# Patient Record
Sex: Male | Born: 1945
Health system: Southern US, Community
[De-identification: ages and names within clinical notes are randomized; demographics above are authoritative.]

## PROBLEM LIST (undated history)

## (undated) DIAGNOSIS — M069 Rheumatoid arthritis, unspecified: Secondary | ICD-10-CM

## (undated) DIAGNOSIS — R209 Unspecified disturbances of skin sensation: Secondary | ICD-10-CM

## (undated) DIAGNOSIS — I82419 Acute embolism and thrombosis of unspecified femoral vein: Secondary | ICD-10-CM

## (undated) DIAGNOSIS — I2510 Atherosclerotic heart disease of native coronary artery without angina pectoris: Secondary | ICD-10-CM

## (undated) DIAGNOSIS — E079 Disorder of thyroid, unspecified: Secondary | ICD-10-CM

## (undated) DIAGNOSIS — I252 Old myocardial infarction: Secondary | ICD-10-CM

## (undated) DIAGNOSIS — M5020 Other cervical disc displacement, unspecified cervical region: Secondary | ICD-10-CM

## (undated) DIAGNOSIS — Z8719 Personal history of other diseases of the digestive system: Secondary | ICD-10-CM

## (undated) DIAGNOSIS — M1990 Unspecified osteoarthritis, unspecified site: Secondary | ICD-10-CM

## (undated) DIAGNOSIS — E119 Type 2 diabetes mellitus without complications: Secondary | ICD-10-CM

## (undated) DIAGNOSIS — E785 Hyperlipidemia, unspecified: Secondary | ICD-10-CM

## (undated) DIAGNOSIS — I10 Essential (primary) hypertension: Secondary | ICD-10-CM

## (undated) DIAGNOSIS — I209 Angina pectoris, unspecified: Secondary | ICD-10-CM

## (undated) DIAGNOSIS — G43909 Migraine, unspecified, not intractable, without status migrainosus: Secondary | ICD-10-CM

## (undated) DIAGNOSIS — J189 Pneumonia, unspecified organism: Secondary | ICD-10-CM

## (undated) DIAGNOSIS — R51 Headache: Secondary | ICD-10-CM

## (undated) DIAGNOSIS — I201 Angina pectoris with documented spasm: Secondary | ICD-10-CM

## (undated) DIAGNOSIS — Z9889 Other specified postprocedural states: Secondary | ICD-10-CM

## (undated) HISTORY — DX: Other specified postprocedural states: Z98.890

## (undated) HISTORY — DX: Other cervical disc displacement, unspecified cervical region: M50.20

## (undated) HISTORY — DX: Personal history of other diseases of the digestive system: Z87.19

## (undated) HISTORY — DX: Rheumatoid arthritis, unspecified: M06.9

## (undated) HISTORY — PX: CARDIAC CATHETERIZATION: SHX172

## (undated) HISTORY — DX: Angina pectoris with documented spasm: I20.1

## (undated) HISTORY — DX: Disorder of thyroid, unspecified: E07.9

---

## 1981-06-10 HISTORY — PX: PENILE PROSTHESIS IMPLANT: SHX240

## 1999-08-06 ENCOUNTER — Encounter: Admission: RE | Admit: 1999-08-06 | Discharge: 1999-11-04 | Payer: Self-pay | Admitting: Endocrinology

## 2003-08-09 ENCOUNTER — Emergency Department (HOSPITAL_COMMUNITY): Admission: EM | Admit: 2003-08-09 | Discharge: 2003-08-09 | Payer: Self-pay | Admitting: Emergency Medicine

## 2003-08-09 IMAGING — CR DG CHEST 2V
2 series · 2 of 2 positions shown · non-contrast
Comparison: none

CLINICAL DATA: Chest pain, shortness of breath, fever, diabetic Type II.  Nausea/vomiting, diarrhea for one day.
 TWO VIEW CHEST
 PA and lateral views of the chest made [DATE] at [JE] hours show generalized peribronchial thickening and some hyperaeration at the bases.  The cardiopericardial silhouette is minimally prominent.  The aorta is slightly elongated but not calcified or dilated.  Peripheral lungs are clear.  The hilar and basilar areas show some generalized peribronchial thickening.  There is no pleural effusion or pneumothorax.  The bones show some hypertrophic spurring but no fracture or metastatic disease.
 IMPRESSION 
 Mild cardiomegaly.  Generalized peribronchial thickening.  Some bibasilar atelectasis.  No definite consolidation or edema.

[view not recorded (1 of 2)]
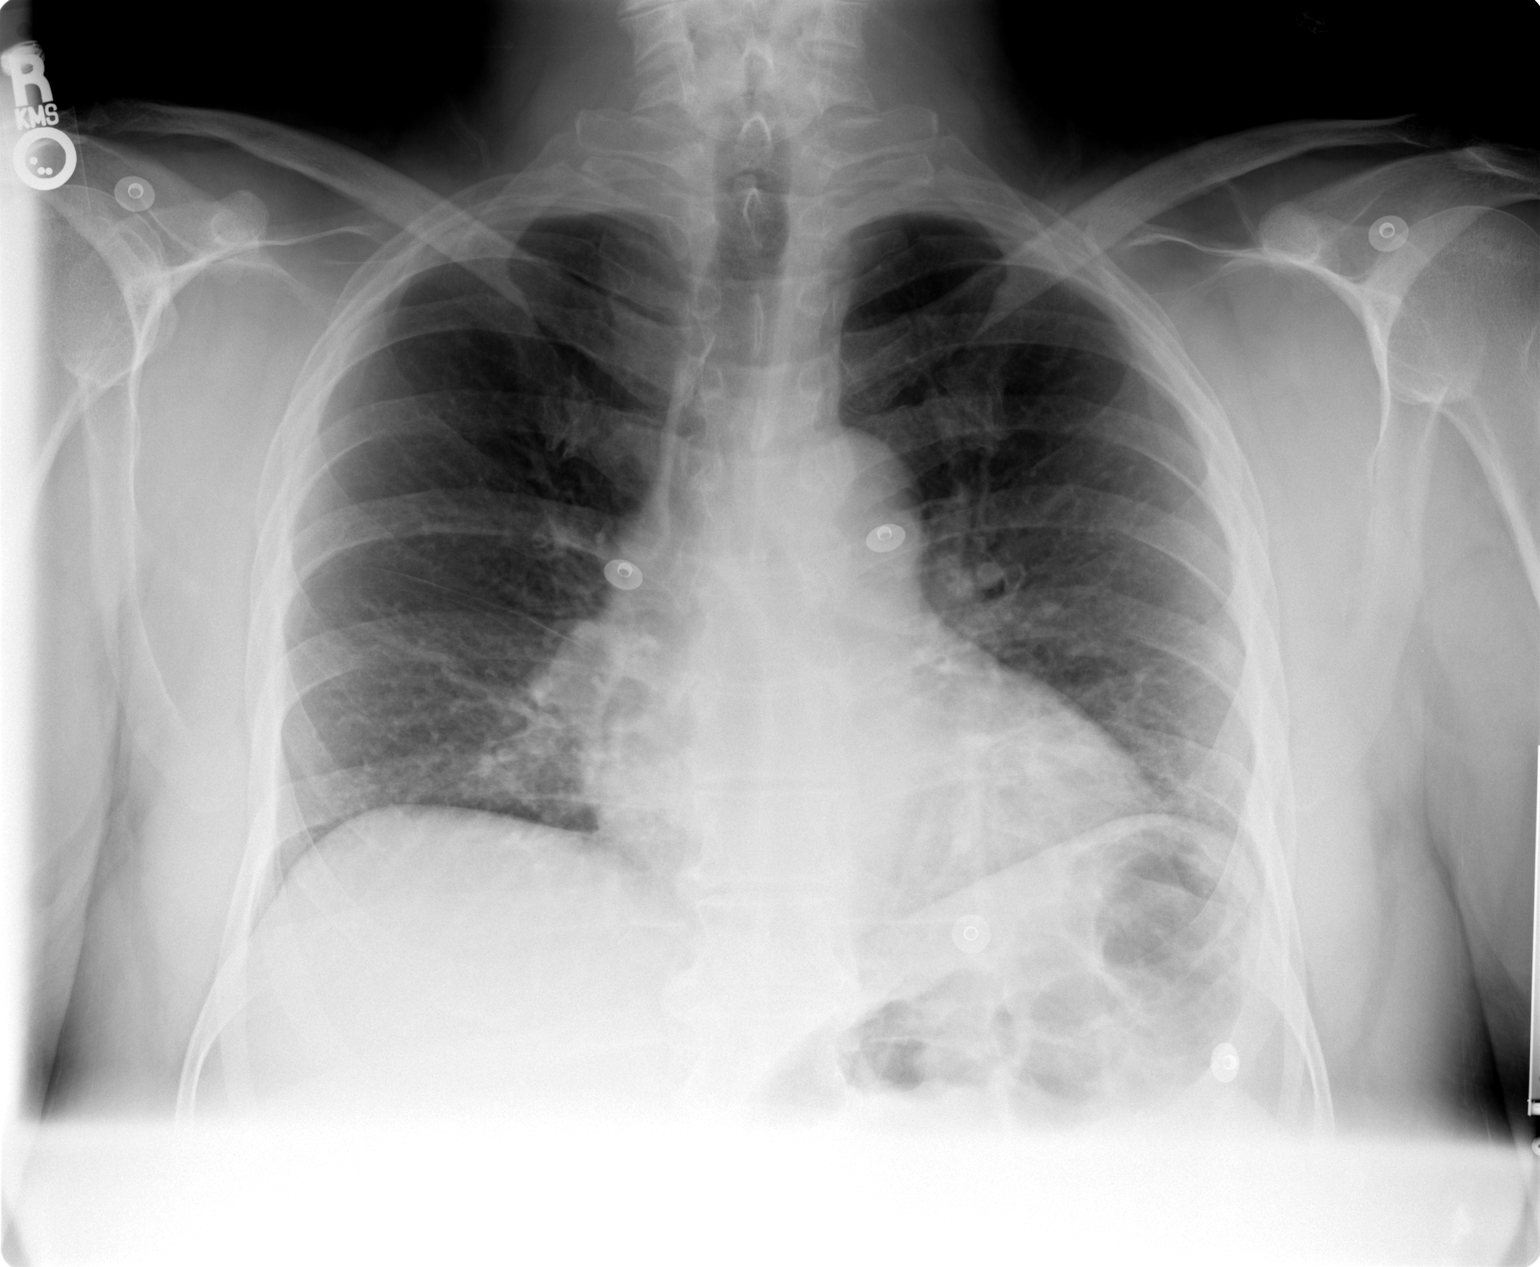

[view not recorded (2 of 2)]
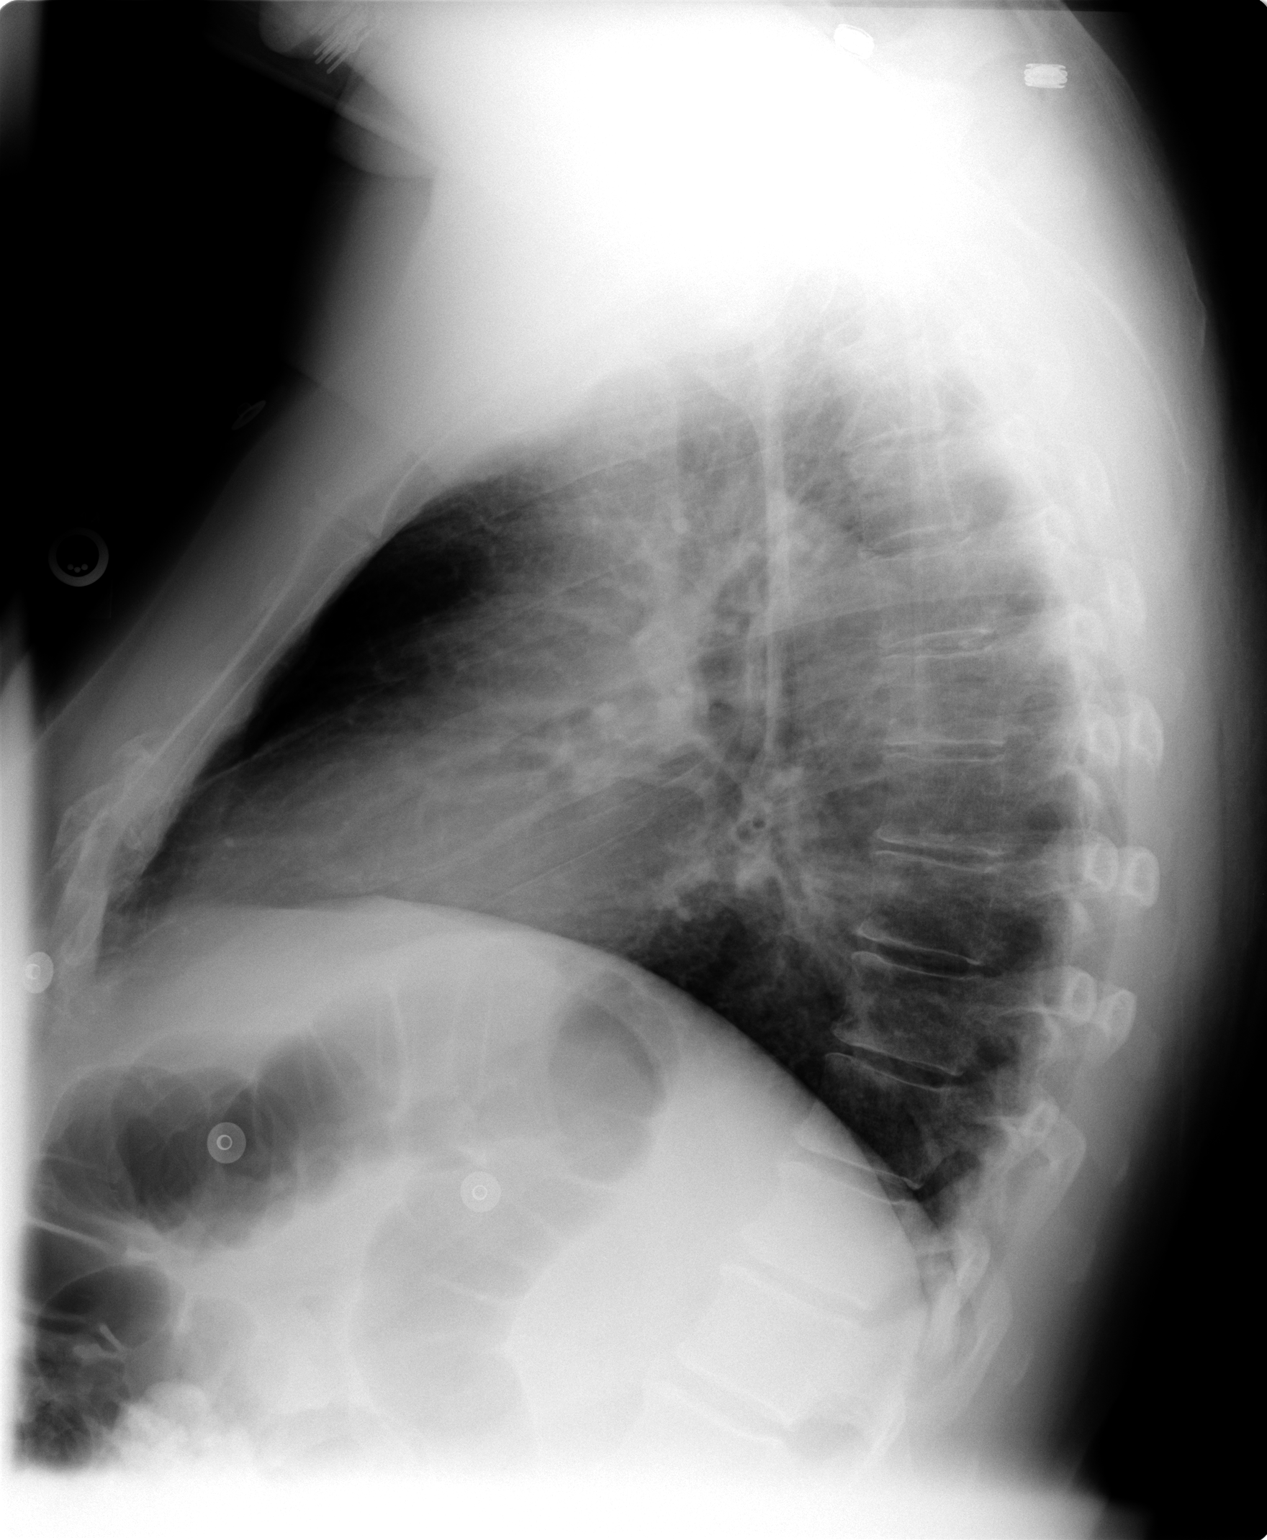

[2 of 2 positions shown; findings below may reference images not displayed]

## 2003-09-14 ENCOUNTER — Ambulatory Visit (HOSPITAL_COMMUNITY): Admission: RE | Admit: 2003-09-14 | Discharge: 2003-09-14 | Payer: Self-pay | Admitting: *Deleted

## 2003-09-14 ENCOUNTER — Encounter (INDEPENDENT_AMBULATORY_CARE_PROVIDER_SITE_OTHER): Payer: Self-pay | Admitting: Specialist

## 2004-09-26 ENCOUNTER — Encounter: Admission: RE | Admit: 2004-09-26 | Discharge: 2004-09-26 | Payer: Self-pay | Admitting: Neurology

## 2005-02-20 ENCOUNTER — Emergency Department (HOSPITAL_COMMUNITY): Admission: EM | Admit: 2005-02-20 | Discharge: 2005-02-20 | Payer: Self-pay | Admitting: Emergency Medicine

## 2005-02-20 IMAGING — DX DG ORTHOPANTOGRAM /PANORAMIC
1 series · 1 of 1 positions shown · non-contrast
Comparison: none

CLINICAL DATA: 59-year-old, fell.  Deep laceration upper lip. 
 ORTHOPANTOGRAM (PANOREX):

[view not recorded]
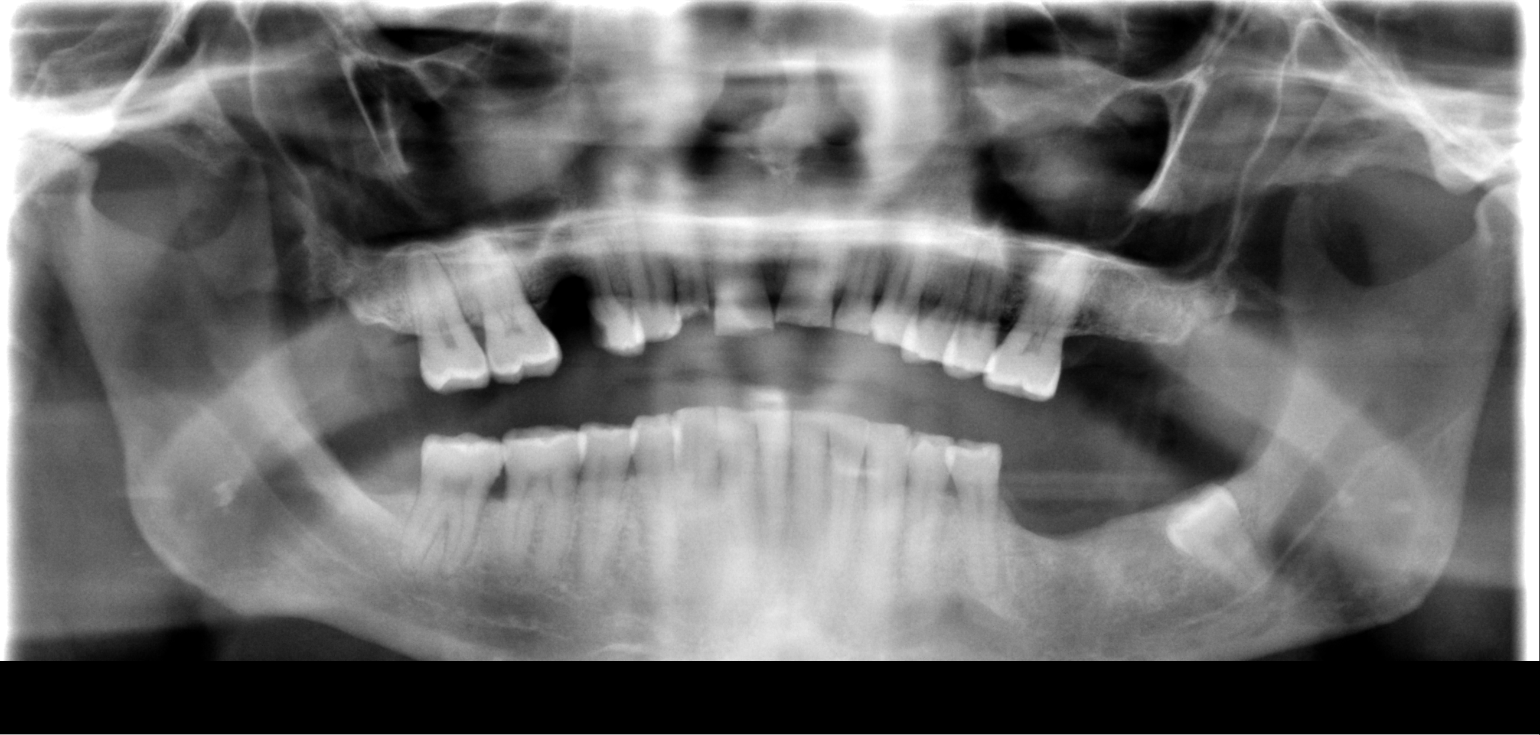

[1 of 1 positions shown; findings below may reference images not displayed]

FINDINGS: The mandibular condyles are normally located.  No mandible fractures are seen.  I do not see any definite fracture of the maxilla.  No evidence for periapical abscess.  There are some dental caries.
IMPRESSION: No mandible or maxilla fractures identified.

## 2005-02-20 IMAGING — CT CT HEAD W/O CM
1 of 2 series · 13 of 30 positions shown, 17 images · non-contrast
Comparison: None.

CLINICAL DATA: Status post fall with possible loss of consciousness.  Laceration.
 HEAD CT WITHOUT CONTRAST ? [DATE]:
TECHNIQUE: Contiguous axial CT images were obtained from the base of the skull through the vertex according to standard protocol without contrast.

[Series 2: brain · axial · 0.49mm/px · z∈[+162,+294]mm · 13 of 32 slices shown, 17 images]
[im 3/32  brain]
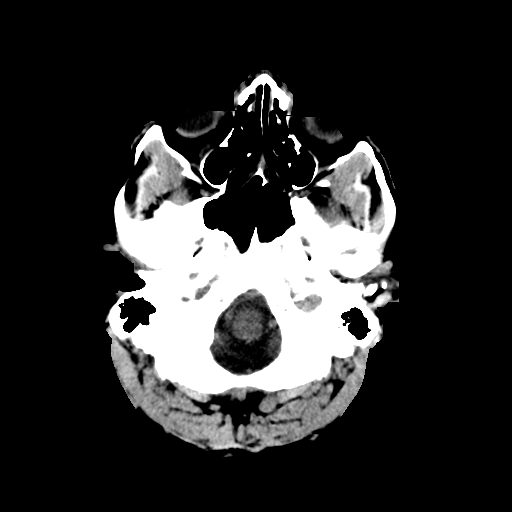
[im 3/32  bone]
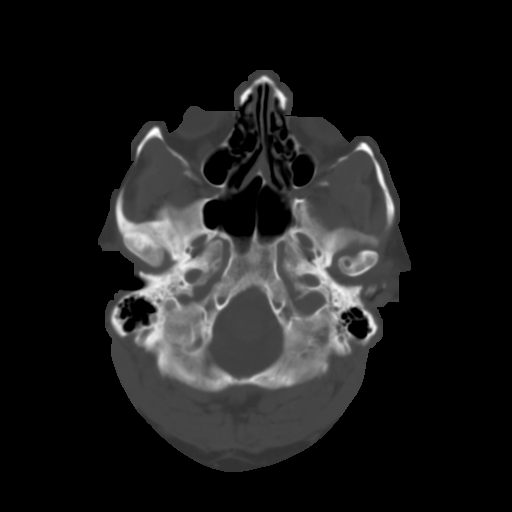
[im 5/32  brain]
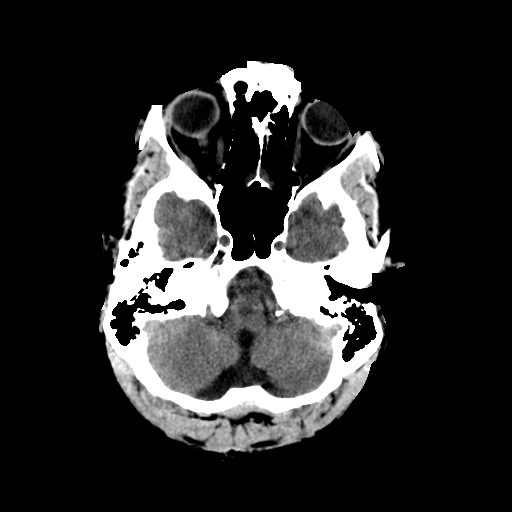
[im 7/32  brain]
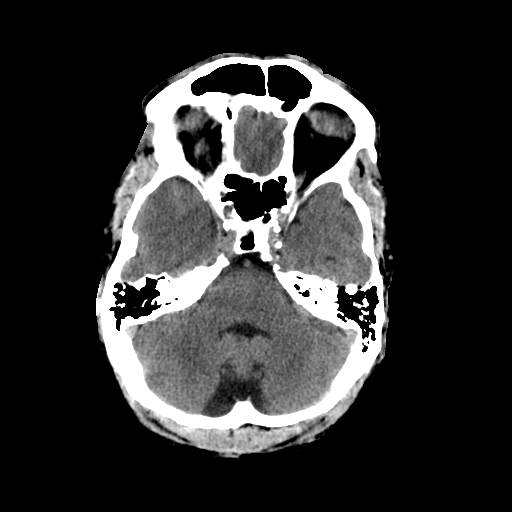
[im 9/32  brain]
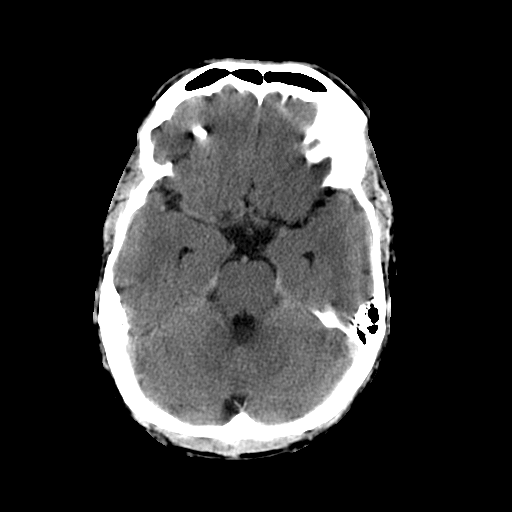
[im 12/32  brain]
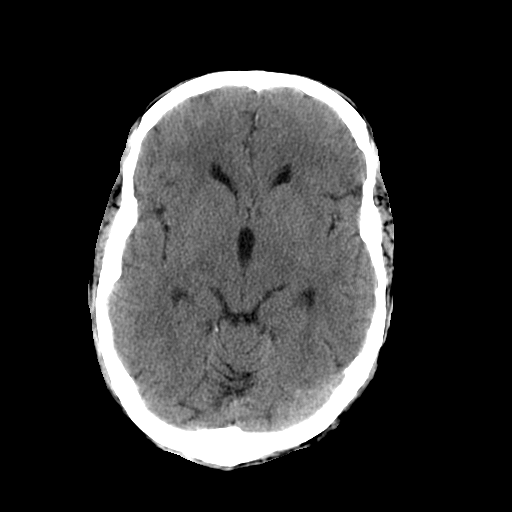
[im 12/32  bone]
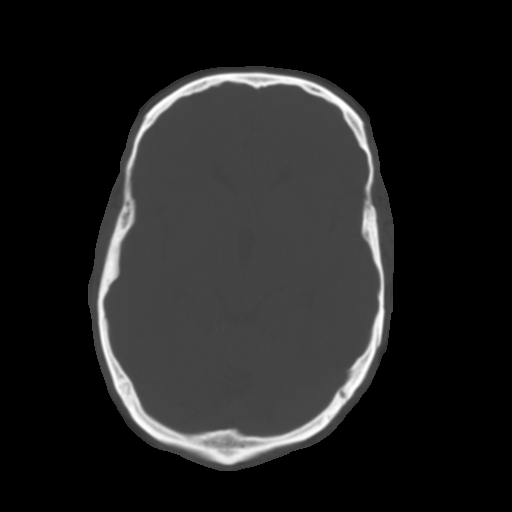
[im 14/32  brain]
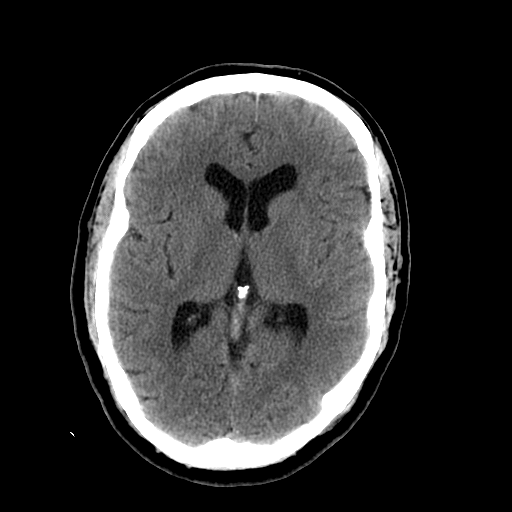
[im 16/32  brain]
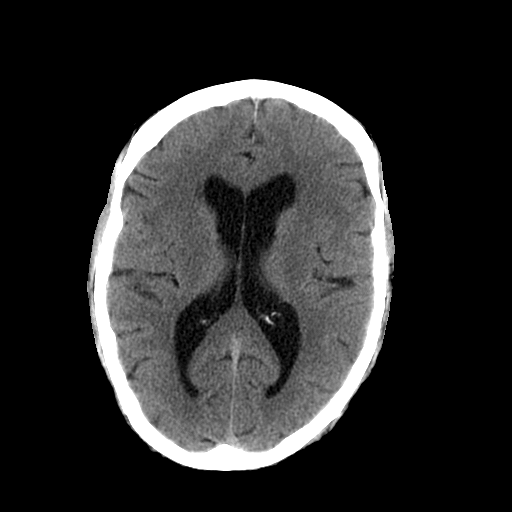
[im 18/32  brain]
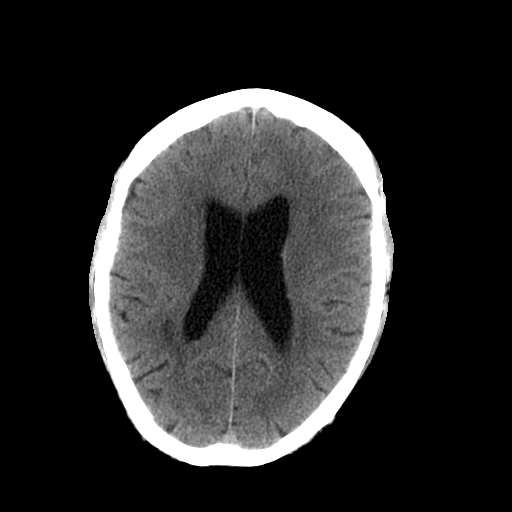
[im 20/32  brain]
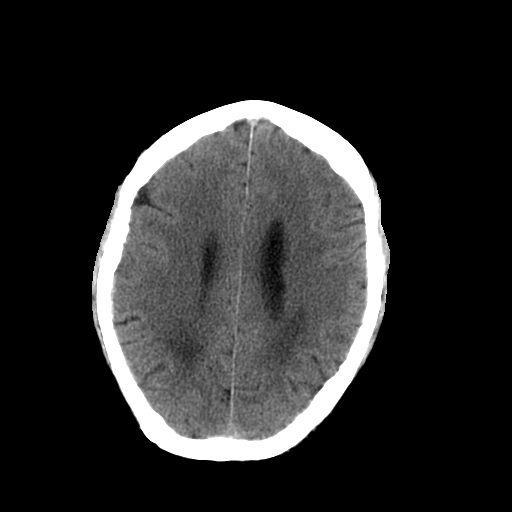
[im 20/32  bone]
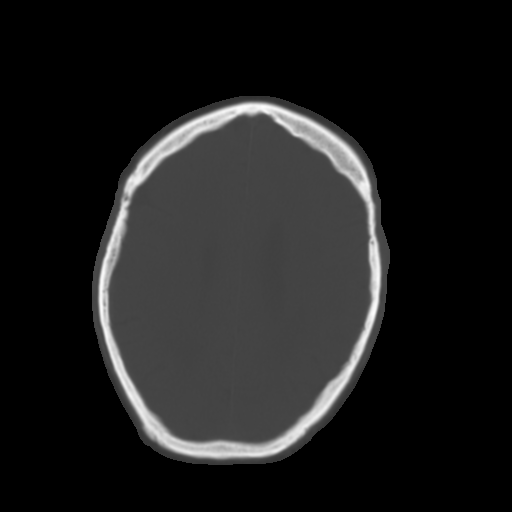
[im 23/32  brain]
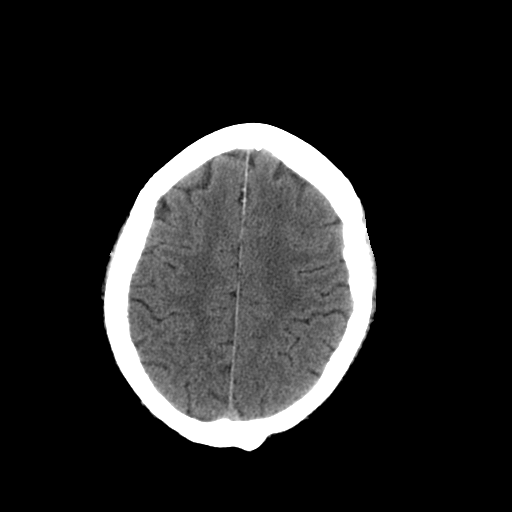
[im 25/32  brain]
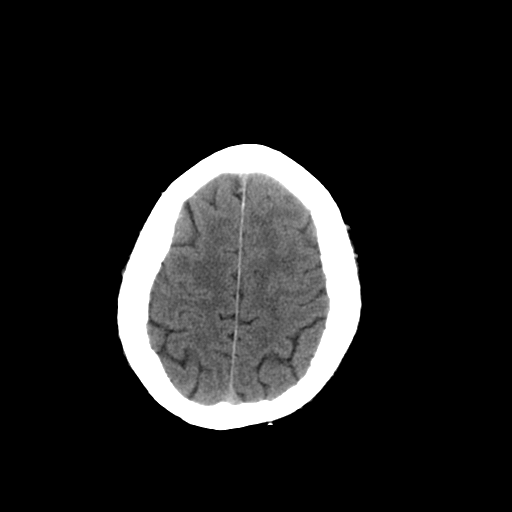
[im 27/32  brain]
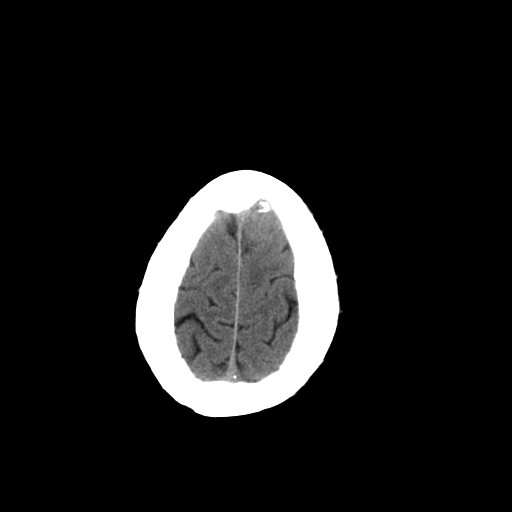
[im 29/32  brain]
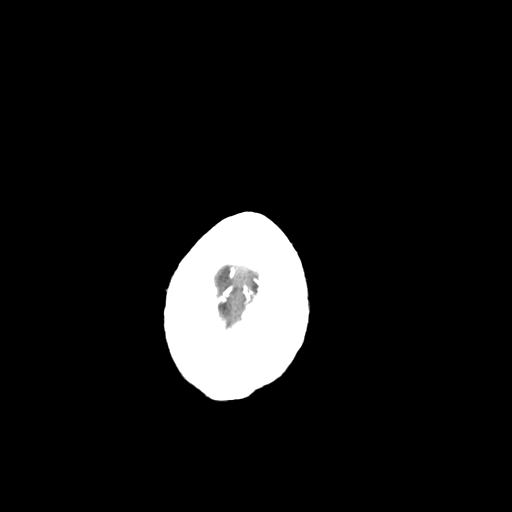
[im 29/32  bone]
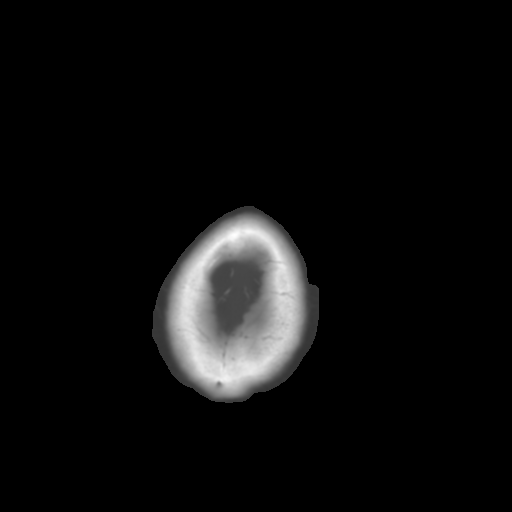

[13 of 30 positions shown; findings below may reference images not displayed]

FINDINGS: No evidence of acute infarct, hemorrhage, mass, mass effect, or hydrocephalus.  There is confluent low attenuation in the periventricular deep white matter.  Apparent low attenuation within the midbrain is felt to be due beam hardening artifact.  There is minimal mucosal thickening in the ethmoid air cells.  The paranasal sinuses are otherwise clear.  No fracture.  The mastoid air cells are clear.
IMPRESSION: 1.  No acute intracranial abnormality.
 2.  Chronic microvascular ischemic disease.

## 2006-06-10 DIAGNOSIS — M5020 Other cervical disc displacement, unspecified cervical region: Secondary | ICD-10-CM

## 2006-06-10 HISTORY — DX: Other cervical disc displacement, unspecified cervical region: M50.20

## 2007-08-06 ENCOUNTER — Observation Stay (HOSPITAL_COMMUNITY): Admission: EM | Admit: 2007-08-06 | Discharge: 2007-08-07 | Payer: Self-pay | Admitting: Emergency Medicine

## 2007-08-06 IMAGING — CR DG CHEST 1V PORT
1 series · 1 of 1 positions shown · non-contrast
Comparison: [DATE].

CLINICAL DATA: Chest pain.
 PORTABLE CHEST - 1 VIEW:

[AP]
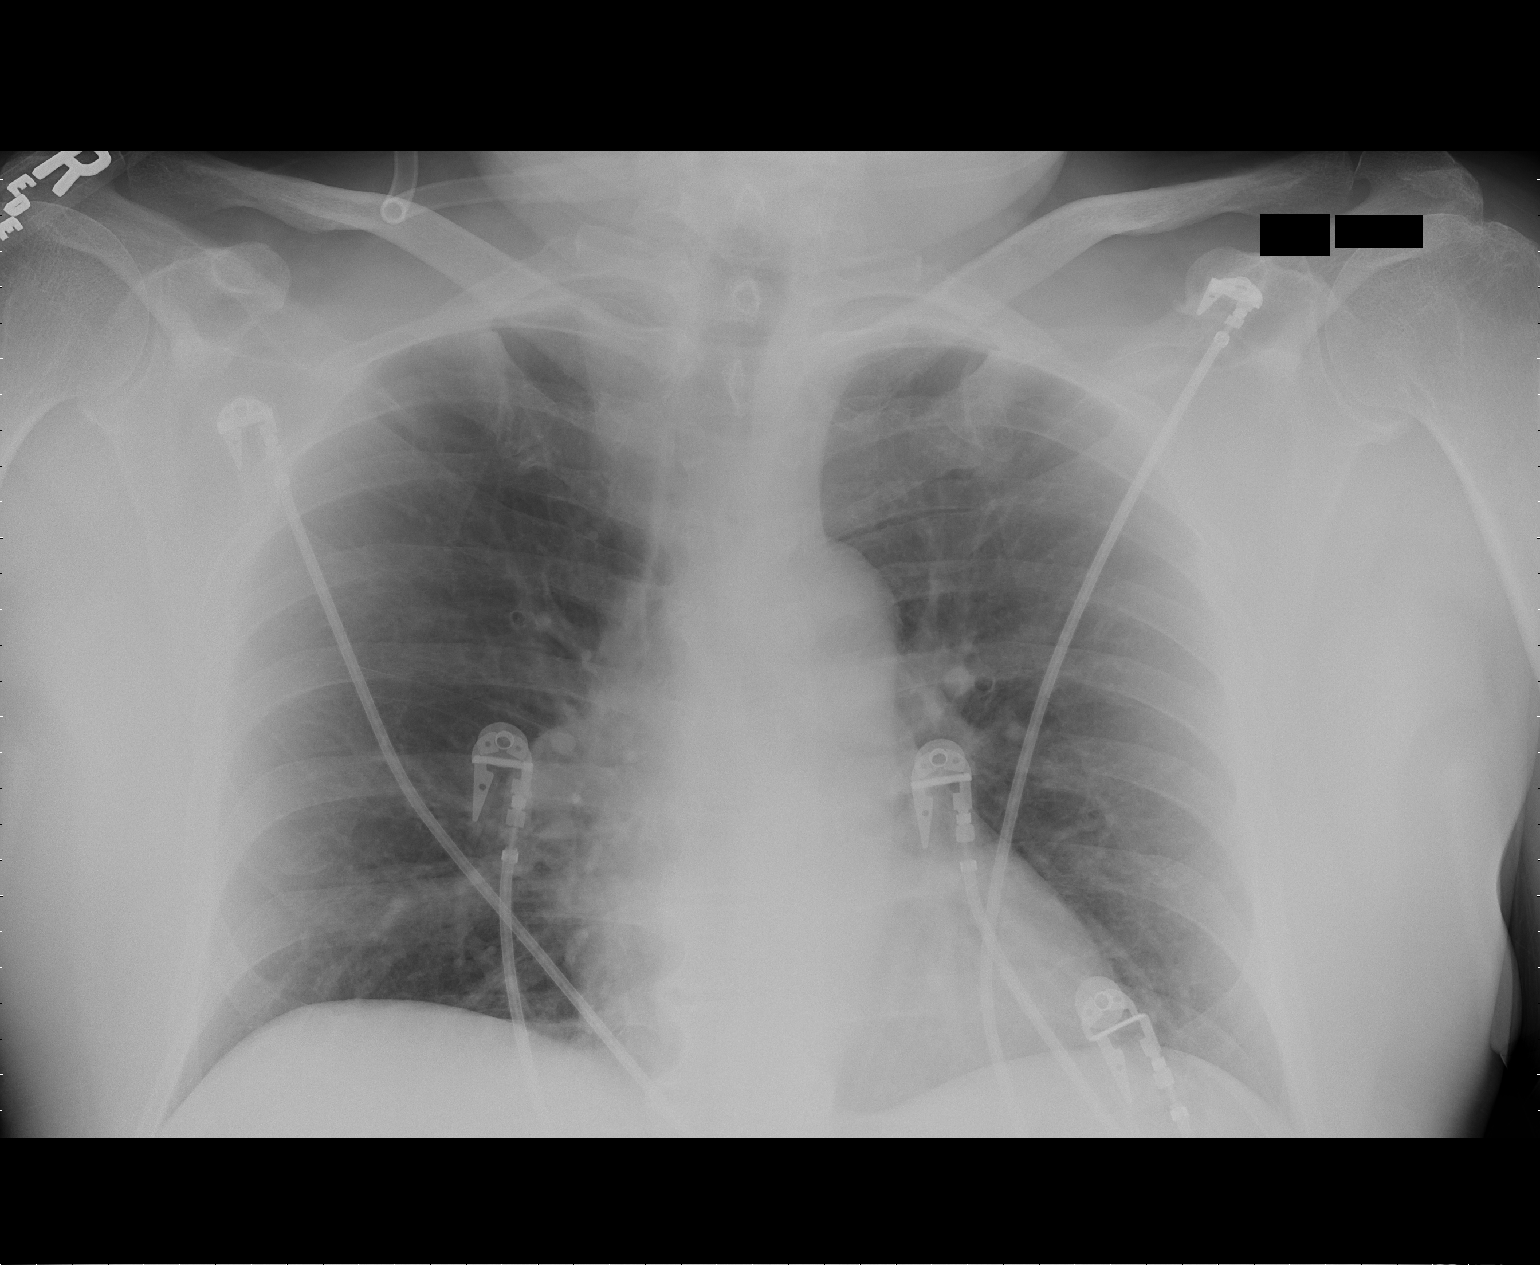

[1 of 1 positions shown; findings below may reference images not displayed]

FINDINGS: Heart and lungs within normal limits for AP projection. Osseous structures intact in 1 view.
IMPRESSION: No active disease.

## 2007-08-12 ENCOUNTER — Emergency Department (HOSPITAL_COMMUNITY): Admission: EM | Admit: 2007-08-12 | Discharge: 2007-08-12 | Payer: Self-pay | Admitting: Emergency Medicine

## 2007-08-12 IMAGING — CT CT CHEST W/ CM
1 of 3 series · 15 of 32 positions shown, 19 images · IV contrast (APPLIED)
Comparison: none

HISTORY: Chest pain, nausea, vomiting

[Series 6: pulm embolism 1.0 thins · axial · 0.62mm/px · z∈[+1282,+1532]mm · 15 of 280 slices shown, 19 images]
[im 15/280  mediastinal]
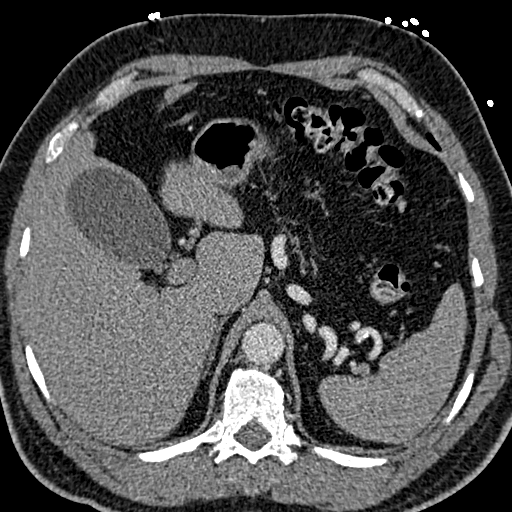
[im 15/280  lung]
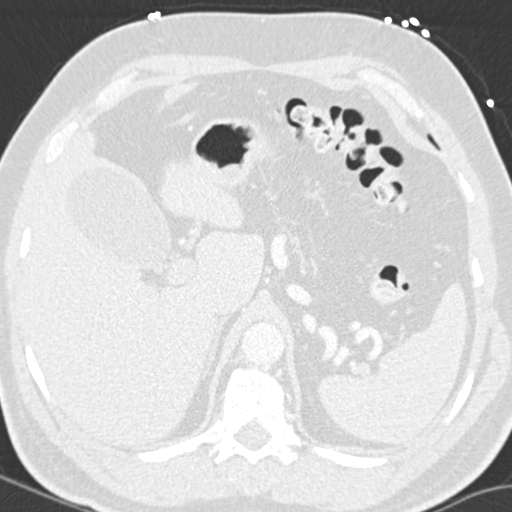
[im 45/280  lung]
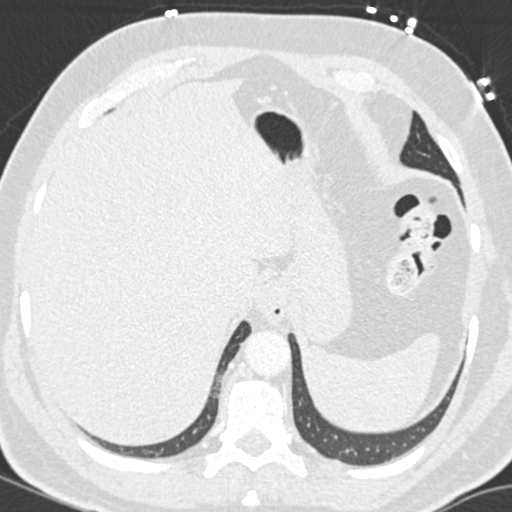
[im 59/280  lung]
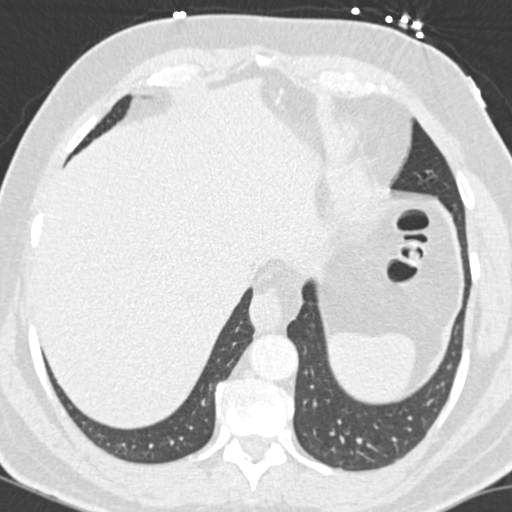
[im 74/280  lung]
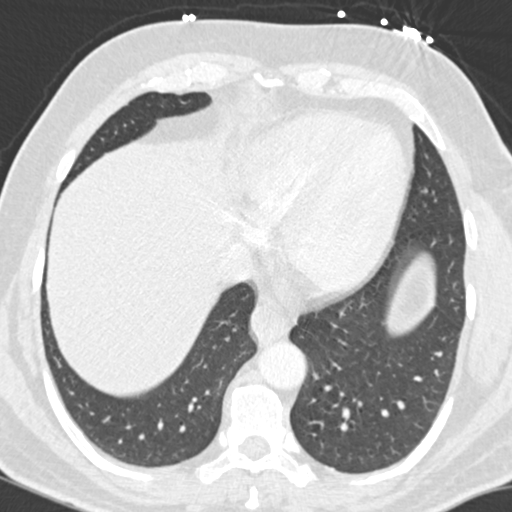
[im 94/280  mediastinal]
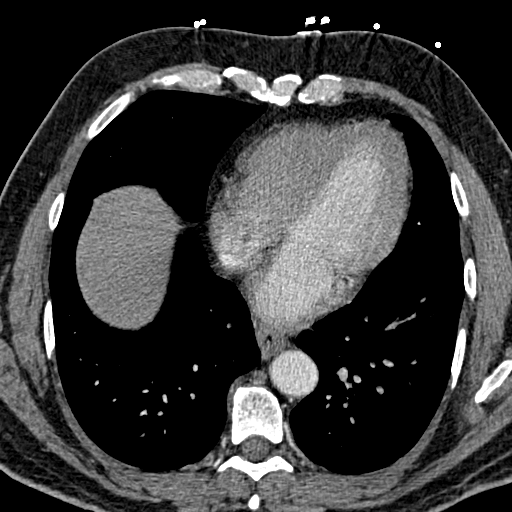
[im 94/280  lung]
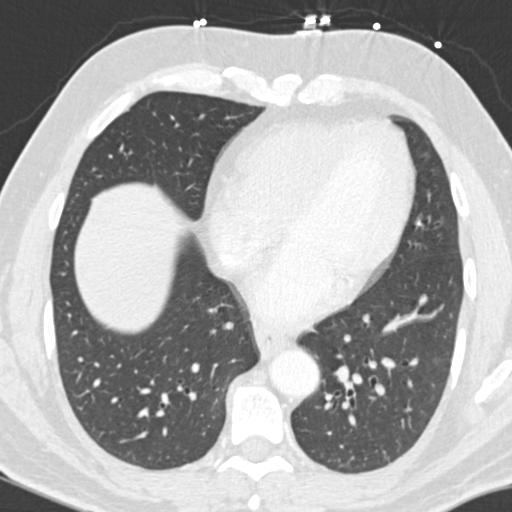
[im 103/280  lung]
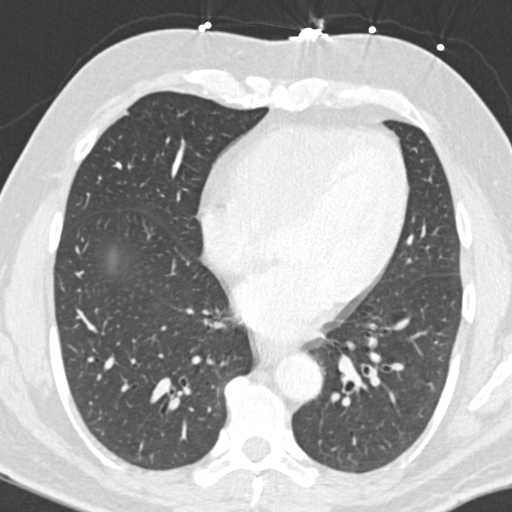
[im 131/280  lung]
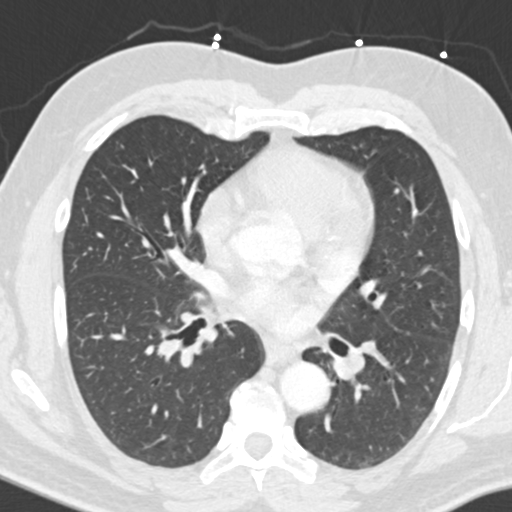
[im 133/280  lung]
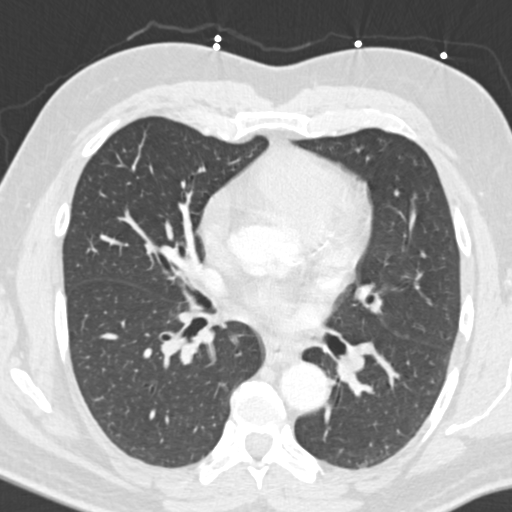
[im 147/280  mediastinal]
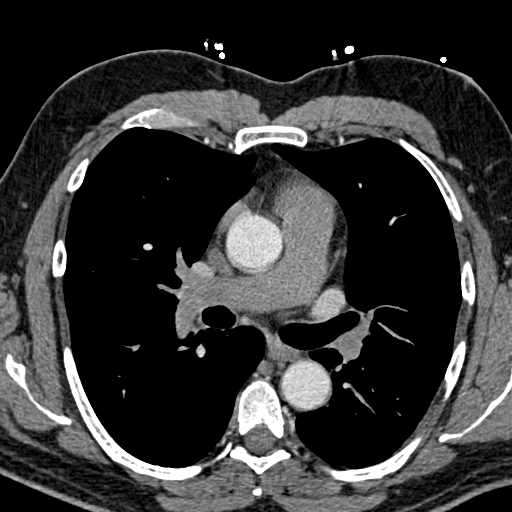
[im 147/280  lung]
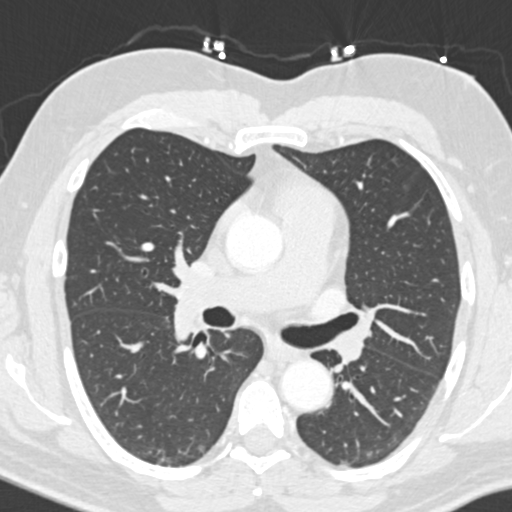
[im 177/280  lung]
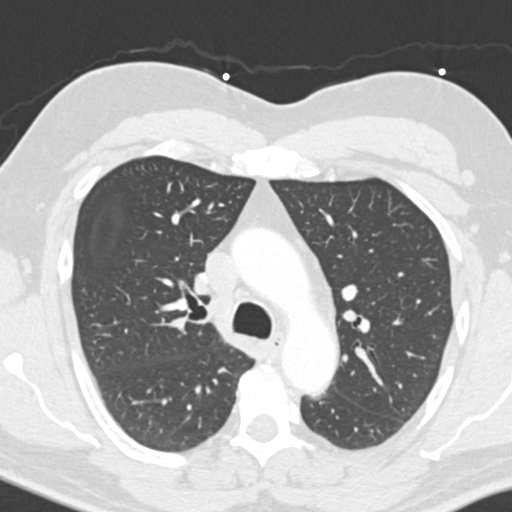
[im 187/280  lung]
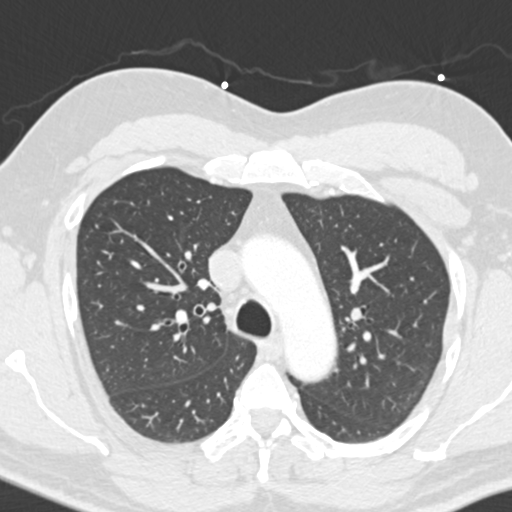
[im 206/280  lung]
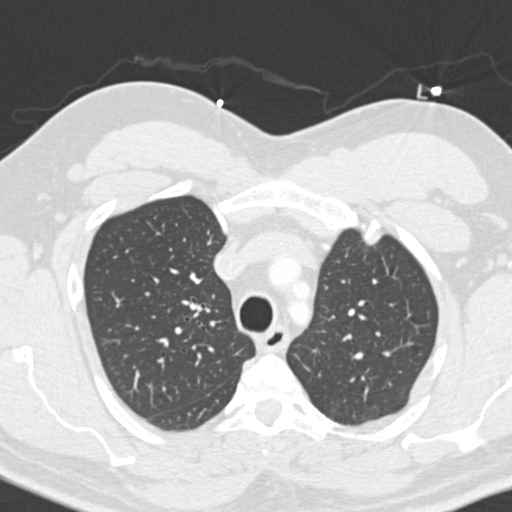
[im 221/280  mediastinal]
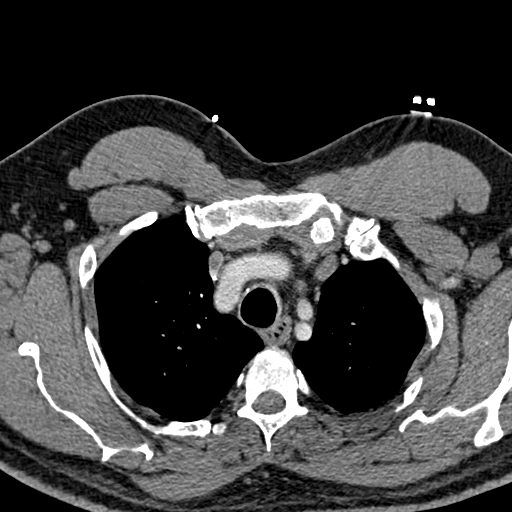
[im 221/280  lung]
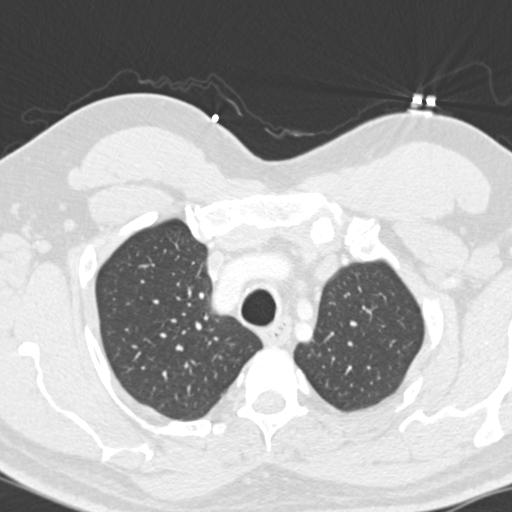
[im 235/280  lung]
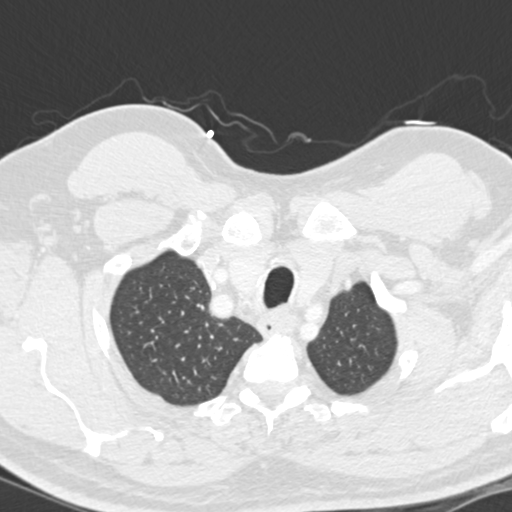
[im 265/280  lung]
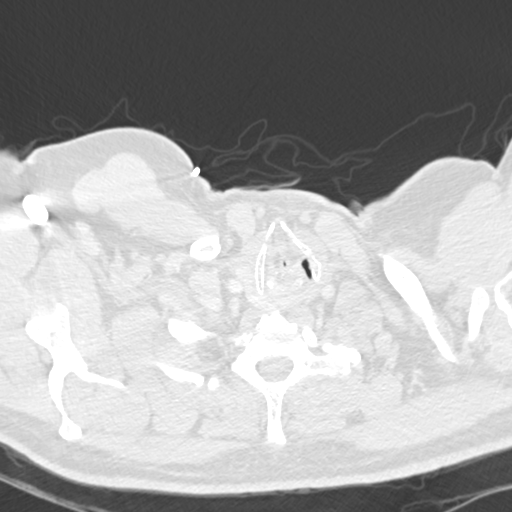

[15 of 32 positions shown; findings below may reference images not displayed]

CT CHEST WITH CONTRAST:

Multidetector helical CT imaging chest performed following 100 cc [7A].
Exam was attempted as a CT angio chest for pulmonary embolism but the contrast
bolus is mis-timed, scanning performed during the systemic arterial phase.
Examination is converted to a CT chest with contrast and interpreted at ER
physician request.
Sagittal and coronal images reconstructed from axial data set.
No prior study for comparison.

Aorta normal caliber without aneurysm or dissection.
Unable to assess pulmonary arterial tree for presence of pulmonary emboli due to
mis-timing.
Coronary arterial calcifications noted.
No thoracic adenopathy.
Visualized portion of upper abdomen normal.
Lungs clear.
No pleural effusion or pneumothorax.
No pulmonary mass or nodule identified.
Questionable tiny 2 to 3 mm diameter nodule in left lower lobe image 62.
Lungs otherwise clear.
Minimal spur formation thoracic spine without acute bone abnormality.
IMPRESSION: No acute thoracic abnormalities.
Single tiny questionable 2 to 3 mm diameter nodular density left lower lobe, can
assess stability by followup CT in 6 months if patient has risk factors for lung
cancer or in 12 months in the absence of risk factors.

## 2009-04-30 IMAGING — CR DG CHEST 1V PORT
1 series · 1 of 1 positions shown · non-contrast
Comparison: none

HISTORY: Chest and left shoulder pain

PORTABLE CHEST ONE VIEW:
Portable exam [WA] hours compared to prior study of [DATE]
Normal heart size, mediastinal contours, and pulmonary vascularity.
Minimal atelectasis versus infiltrate right lung base.
Lungs otherwise clear.
No pneumothorax.

[AP]
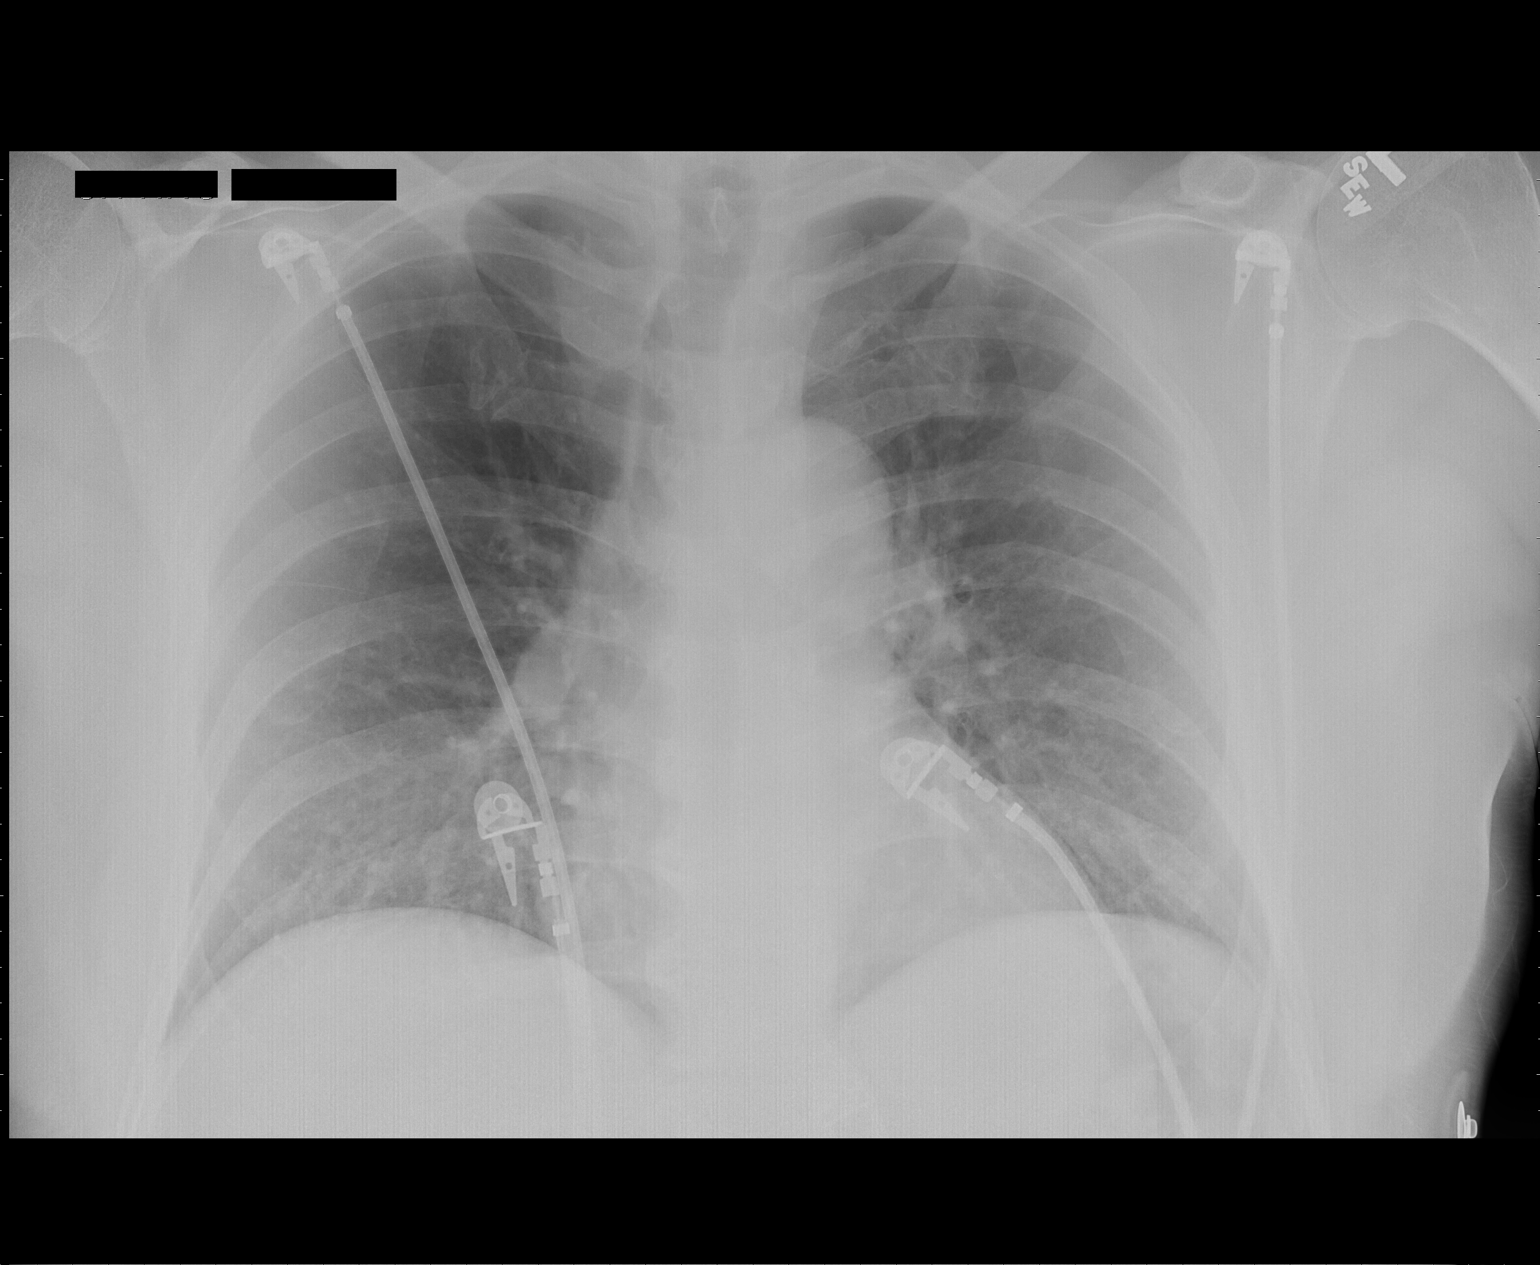

[1 of 1 positions shown; findings below may reference images not displayed]

IMPRESSION: Minimal atelectasis versus infiltrate right base.

## 2010-10-23 NOTE — H&P (Signed)
NAME:  William Hunter, William Hunter NO.:  [id number]   MEDICAL RECORD NO.:  [id number]          PATIENT TYPE:  EMS   LOCATION:  MAJO                         FACILITY:  [facility]   PHYSICIAN:  Beckey Rutter, MD  DATE OF BIRTH:  12-Sep-1945   DATE OF ADMISSION:  08/06/2007  DATE OF DISCHARGE:                              HISTORY & PHYSICAL   PRIMARY CARE PHYSICIAN:  Brooke Bonito, M.D.   CARDIOLOGIST:  Antionette Char, MD   CHIEF COMPLAINT:  Chest pain.   HISTORY OF PRESENT ILLNESS:  A 65 year old Caucasian male with past  medical history significant for coronary artery disease, diabetes,  hypercholesterolemia, presented today with left-sided chest pain.  The  pain started while he was in a sitting position yesterday, mainly to the  left side of the chest.  He could not recollect any aggravating factor  or relieving factor, but he noticed the pain was progressively  worsening.  This morning the pain was 10/10, and he had to bring himself  to the emergency department.  The chest pain is not associated with  nausea, vomiting or cough.  He noticed some sweating yesterday, but  there is no palpitation or dizziness.  The pain is worsened with touch  to the left side of his chest.   PAST MEDICAL HISTORY:  1. Diabetes.  2. Hypercholesterolemia.  3. Questionable coronary artery disease.   SOCIAL HISTORY:  Denied drug abuse, not a drinker, denies smoking.   FAMILY HISTORY:  Noncontributory.   MEDICATION ALLERGIES:  CODEINE, PENICILLIN.   MEDICATION:  1. Crestor.  2. Glimepiride.  3. Taztia XT.   REVIEW OF SYSTEMS:  A 12-point review of system is noncontributory.   PHYSICAL EXAMINATION:  GENERAL:  He is alert and oriented, lying flat in  the bed, not in any acute distress.  VITAL SIGNS:  Temperature is 98.6, blood pressure 129 x 80, pulse 64,  respiratory rate is 18.  HEENT:  Head atraumatic, normocephalic.  Eyes: PERRL.  Mouth moist.  No  ulcer.  NECK:  Supple.  No JVD.  PRECORDIUM:  First and second heart sound audible with tenderness on  palpation to the left side of the chest.  No tenderness on the right  side.  ABDOMEN:  Soft, nontender.  Bowel sounds present.  EXTREMITIES: No lower extremity edema.   LABORATORY DATA/X-RAYS:  EKG showing normal sinus rhythm with  ventricular rate of 80, normal axis, normal QRS complex and normal ST  and T-wave.  Creatinine 0.9, hemoglobin is 16.6, hematocrit 49.0, sodium  138, potassium 4.2, chloride is 104, glucose 157, BUN is 14.  Troponin  is less than 0.05, myoglobin is 59.8, white blood count is 8.9.  Chest x-  ray done today showing no active disease.   ASSESSMENT AND PLAN:  This is a 65 year old male with multiple medical  problems presented today with chest pain, atypical for myocardial  infarction.  1. The patient will be admitted for telemetry floor for further      assessment and management.  2. Will rule out acute coronary syndromes by serial EKGs  and serial      cardiac enzymes.  3. We will check fasting lipid profile.  4. We will check thyroid function tests by thyroid-stimulating      hormone.  5. For diabetes, will start sliding scale and will continue the      patient on oral sulfonylurea.  We will check A1c.  6. Hypertension.  Will continue the patient in [place].  7. For DVT prophylaxis, I will start Lovenox at the prophylaxis dose.  8. For GI prophylaxis, will start the patient on Protonix.      Beckey Rutter, MD  Electronically Signed     EME/MEDQ  D:  08/06/2007  T:  08/07/2007  Job:  [id number]

## 2010-10-23 NOTE — Discharge Summary (Signed)
NAME:  William Hunter NO.:  [id number]   MEDICAL RECORD NO.:  [id number]          PATIENT TYPE:  INP   LOCATION:  6710                         FACILITY:  [facility]   PHYSICIAN:  Beckey Rutter, MD  DATE OF BIRTH:  16-Apr-1946   DATE OF ADMISSION:  08/06/2007  DATE OF DISCHARGE:  08/07/2007                               DISCHARGE SUMMARY   PRIMARY CARE PHYSICIAN:  Brooke Bonito, MD   PRIMARY CARDIOLOGIST:  Antionette Char, MD   CHIEF COMPLAINT AND HISTORY OF PRESENT ILLNESS:  A 65 year old presented  with atypical left-sided chest pain.   HOSPITAL COURSE:  The patient ruled out by serial cardiac enzymes and  serial EKG for acute coronary syndrome.  The patient has left pectoralis  muscle and tenderness on exam, which is consistent with his presenting  pain.   HOSPITAL CONSULTATION:  Cardiology service, Dr. Aleen Campi.   DISCHARGE PLAN:  The patient was discharged today to followup with Dr.  Juleen China and Dr. Aleen Campi for possible ETT  as an outpatient.   DISCHARGE DIAGNOSES:  1. Left-sided chest pain with pectoralis tenderness.  2. History of coronary artery disease.  3. Diabetes.  4. Hyperlipidemia.   DISCHARGE MEDICATIONS:  1. Crestor 10 mg nightly.  2. Taztia 30 mg daily.  3. Glimepiride 1-1/2 tabs daily.  4. Vicodin for pain control.      Beckey Rutter, MD  Electronically Signed     EME/MEDQ  D:  08/07/2007  T:  08/07/2007  Job:  [id number]   cc:   Brooke Bonito, M.D.  Antionette Char, MD

## 2010-10-26 NOTE — Op Note (Signed)
NAME:  William Hunter, William Hunter                           ACCOUNT NO.:  [id number]   MEDICAL RECORD NO.:  [id number]                   PATIENT TYPE:  AMB   LOCATION:  ENDO                                 FACILITY:  [facility]   PHYSICIAN:  Georgiana Spinner, M.D.                 DATE OF BIRTH:  March 09, 1946   DATE OF PROCEDURE:  DATE OF DISCHARGE:                                 OPERATIVE REPORT   PROCEDURE:  Colonoscopy.   ANESTHESIA:  Demerol 40, Versed 3 mg.   DESCRIPTION OF PROCEDURE:  With the patient mildly sedated in the left  lateral decubitus position, the Olympus videoscopic colonoscope was inserted  in the rectum and passed under direct vision to the cecum identified by the  ileocecal valve and appendiceal orifice both of which were photographed.  From this point, the colonoscope was slowly withdrawn taking circumferential  views of the colonic mucosa stopping only in the rectum which appeared  normal in direct and retroflexed view. The endoscope was straightened and  withdrawn. The patient's vital signs and pulse oximeter remained stable. The  patient tolerated the procedure without apparent complications.   FINDINGS:  Unremarkable examination.   PLAN:  See endoscopy note for further details.                                               Georgiana Spinner, M.D.    GMO/MEDQ  D:  09/14/2003  T:  09/14/2003  Job:  [id number]

## 2010-10-26 NOTE — Op Note (Signed)
NAME:  William Hunter, William Hunter                           ACCOUNT NO.:  [id number]   MEDICAL RECORD NO.:  [id number]                   PATIENT TYPE:  AMB   LOCATION:  ENDO                                 FACILITY:  [facility]   PHYSICIAN:  Georgiana Spinner, M.D.                 DATE OF BIRTH:  04-15-1946   DATE OF PROCEDURE:  09/14/2003  DATE OF DISCHARGE:                                 OPERATIVE REPORT   PROCEDURE:  Upper endoscopy.   INDICATIONS FOR PROCEDURE:  Gastroesophageal reflux disease.   ANESTHESIA:  Demerol 60, Versed 6 mg.   DESCRIPTION OF PROCEDURE:  With the patient mildly sedated in the left  lateral decubitus position, the Olympus videoscopic endoscope was inserted  in the mouth and passed under direct vision through the esophagus which  appeared normal until we reached the distal esophagus and there was changes  of Barrett's photographed and biopsied.  We entered into the stomach. The  fundus, body, antrum, duodenal bulb and second portion of the duodenum  appeared normal other than a nodularity in the antrum.  From this point, the  endoscope was slowly withdrawn taking circumferential views of the duodenal  mucosa until the endoscope was then pulled back in the stomach at which  point we biopsied the nodule in the antrum, placed in retroflexion to view  the stomach from below and then straightened and withdrew the endoscope  taking circumferential views of the remaining gastric and esophageal mucosa.  The patient's vital signs and pulse oximeter remained stable. The patient  tolerated the procedure well without apparent complications.   FINDINGS:  Nodules in the antrum biopsied.  Barrett's esophagus biopsied.  Await biopsy report.  The patient will call me for results and followup with  me as an outpatient.  Proceed to colonoscopy.                                               Georgiana Spinner, M.D.    GMO/MEDQ  D:  09/14/2003  T:  09/14/2003  Job:  [id number]

## 2011-03-01 LAB — CARDIAC PANEL(CRET KIN+CKTOT+MB+TROPI)
CK, MB: 1.8
CK, MB: 2
Relative Index: 1.6
Relative Index: 1.7

## 2011-03-01 LAB — I-STAT 8, (EC8 V) (CONVERTED LAB)
Bicarbonate: 28 — ABNORMAL HIGH
Glucose, Bld: 157 — ABNORMAL HIGH
Hemoglobin: 16.7
Operator id: 285841
Potassium: 4.2
Sodium: 138
TCO2: 29
pCO2, Ven: 47.1
pH, Ven: 7.383 — ABNORMAL HIGH

## 2011-03-01 LAB — LIPID PANEL
HDL: 32 — ABNORMAL LOW
LDL Cholesterol: 94
Triglycerides: 161 — ABNORMAL HIGH
VLDL: 32

## 2011-03-01 LAB — CBC
HCT: 42.3
HCT: 46.5
Hemoglobin: 14.5
Hemoglobin: 16
MCHC: 34.2
MCHC: 34.4
MCV: 89.3
RBC: 4.76
WBC: 9.7

## 2011-03-01 LAB — POCT CARDIAC MARKERS
CKMB, poc: 1.5
Operator id: 285841
Troponin i, poc: <0.05

## 2011-03-01 LAB — DIFFERENTIAL
Basophils Absolute: 0
Basophils Relative: 0
Lymphs Abs: 1.1
Neutro Abs: 7.3
Neutrophils Relative %: 82 — ABNORMAL HIGH

## 2011-03-01 LAB — HEMOGLOBIN A1C: Hgb A1c MFr Bld: 7.4 — ABNORMAL HIGH

## 2011-03-01 LAB — POCT I-STAT CREATININE: Creatinine, Ser: 0.9

## 2011-03-01 LAB — BASIC METABOLIC PANEL
BUN: 14
Chloride: 103
Creatinine, Ser: 1.03
Potassium: 4
Sodium: 140

## 2011-03-01 LAB — CK TOTAL AND CKMB (NOT AT ARMC): Relative Index: 1.7

## 2011-03-04 LAB — DIFFERENTIAL
Eosinophils Relative: 3
Lymphs Abs: 2.6
Monocytes Absolute: 0.6
Monocytes Relative: 8
Neutrophils Relative %: 57

## 2011-03-04 LAB — I-STAT 8, (EC8 V) (CONVERTED LAB)
Acid-Base Excess: 1
Bicarbonate: 27.6 — ABNORMAL HIGH
Operator id: 272551
TCO2: 29
pCO2, Ven: 48.7
pH, Ven: 7.362 — ABNORMAL HIGH

## 2011-03-04 LAB — POCT CARDIAC MARKERS
Myoglobin, poc: 53.8
Operator id: 272551
Operator id: 277751
Troponin i, poc: <0.05
Troponin i, poc: <0.05

## 2011-03-04 LAB — CBC
HCT: 48.1
MCHC: 33.9
MCV: 89.5
RBC: 5.37
RDW: 13.1

## 2012-03-09 ENCOUNTER — Inpatient Hospital Stay (HOSPITAL_COMMUNITY)
Admission: EM | Admit: 2012-03-09 | Discharge: 2012-03-12 | DRG: 494 | Disposition: A | Discharge status: Home / Self Care | Payer: BC Managed Care – PPO | Attending: Internal Medicine | Admitting: Internal Medicine

## 2012-03-09 ENCOUNTER — Emergency Department (HOSPITAL_COMMUNITY): Payer: BC Managed Care – PPO

## 2012-03-09 ENCOUNTER — Encounter (HOSPITAL_COMMUNITY): Payer: Self-pay

## 2012-03-09 DIAGNOSIS — I252 Old myocardial infarction: Secondary | ICD-10-CM

## 2012-03-09 DIAGNOSIS — K8010 Calculus of gallbladder with chronic cholecystitis without obstruction: Principal | ICD-10-CM | POA: Diagnosis present

## 2012-03-09 DIAGNOSIS — I2510 Atherosclerotic heart disease of native coronary artery without angina pectoris: Secondary | ICD-10-CM | POA: Diagnosis present

## 2012-03-09 DIAGNOSIS — I214 Non-ST elevation (NSTEMI) myocardial infarction: Secondary | ICD-10-CM

## 2012-03-09 DIAGNOSIS — E119 Type 2 diabetes mellitus without complications: Secondary | ICD-10-CM | POA: Diagnosis present

## 2012-03-09 DIAGNOSIS — I10 Essential (primary) hypertension: Secondary | ICD-10-CM | POA: Diagnosis present

## 2012-03-09 DIAGNOSIS — E785 Hyperlipidemia, unspecified: Secondary | ICD-10-CM | POA: Diagnosis present

## 2012-03-09 DIAGNOSIS — R109 Unspecified abdominal pain: Secondary | ICD-10-CM | POA: Diagnosis present

## 2012-03-09 DIAGNOSIS — K8020 Calculus of gallbladder without cholecystitis without obstruction: Secondary | ICD-10-CM

## 2012-03-09 DIAGNOSIS — R079 Chest pain, unspecified: Secondary | ICD-10-CM

## 2012-03-09 HISTORY — DX: Unspecified disturbances of skin sensation: R20.9

## 2012-03-09 HISTORY — DX: Pneumonia, unspecified organism: J18.9

## 2012-03-09 HISTORY — DX: Essential (primary) hypertension: I10

## 2012-03-09 HISTORY — DX: Old myocardial infarction: I25.2

## 2012-03-09 HISTORY — DX: Atherosclerotic heart disease of native coronary artery without angina pectoris: I25.10

## 2012-03-09 HISTORY — DX: Migraine, unspecified, not intractable, without status migrainosus: G43.909

## 2012-03-09 HISTORY — DX: Unspecified osteoarthritis, unspecified site: M19.90

## 2012-03-09 HISTORY — DX: Hyperlipidemia, unspecified: E78.5

## 2012-03-09 HISTORY — DX: Angina pectoris, unspecified: I20.9

## 2012-03-09 HISTORY — DX: Type 2 diabetes mellitus without complications: E11.9

## 2012-03-09 HISTORY — DX: Headache: R51

## 2012-03-09 LAB — COMPREHENSIVE METABOLIC PANEL
Albumin: 3.7 g/dL (ref 3.5–5.2)
Alkaline Phosphatase: 61 U/L (ref 39–117)
BUN: 14 mg/dL (ref 6–23)
Creatinine, Ser: 0.78 mg/dL (ref 0.50–1.35)
Potassium: 4.1 mEq/L (ref 3.5–5.1)
Total Protein: 7.4 g/dL (ref 6.0–8.3)

## 2012-03-09 LAB — PROTIME-INR
INR: 0.98 (ref 0.00–1.49)
Prothrombin Time: 12.9 seconds (ref 11.6–15.2)
Prothrombin Time: 13.2 seconds (ref 11.6–15.2)

## 2012-03-09 LAB — CBC
HCT: 42.2 % (ref 39.0–52.0)
Hemoglobin: 14.8 g/dL (ref 13.0–17.0)
MCH: 30.4 pg (ref 26.0–34.0)
MCHC: 34.6 g/dL (ref 30.0–36.0)
Platelets: 209 10*3/uL (ref 150–400)
Platelets: 210 10*3/uL (ref 150–400)
RBC: 4.87 MIL/uL (ref 4.22–5.81)
RDW: 12.6 % (ref 11.5–15.5)
WBC: 9.1 10*3/uL (ref 4.0–10.5)

## 2012-03-09 LAB — TROPONIN I: Troponin I: <0.3 ng/mL (ref <0.30)

## 2012-03-09 LAB — CREATININE, SERUM
Creatinine, Ser: 0.77 mg/dL (ref 0.50–1.35)
GFR calc non Af Amer: >90 mL/min (ref >90)

## 2012-03-09 LAB — LIPASE, BLOOD: Lipase: 38 U/L (ref 11–59)

## 2012-03-09 LAB — POCT I-STAT TROPONIN I: Troponin i, poc: 0 ng/mL (ref 0.00–0.08)

## 2012-03-09 IMAGING — US US ABDOMEN COMPLETE
1 series · 13 of 25 positions shown · non-contrast
Comparison: CT chest [DATE]

CLINICAL DATA: Chest pain and right-sided abdominal pain.

ABDOMINAL ULTRASOUND COMPLETE

[Series 1: us abdomen complete · 0.31mm/px · 13 of 65 slices shown]
[im 1/65]
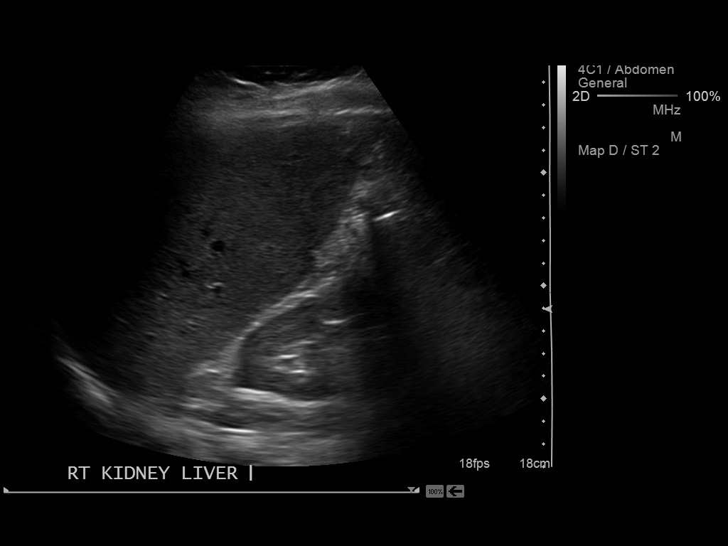
[im 6/65]
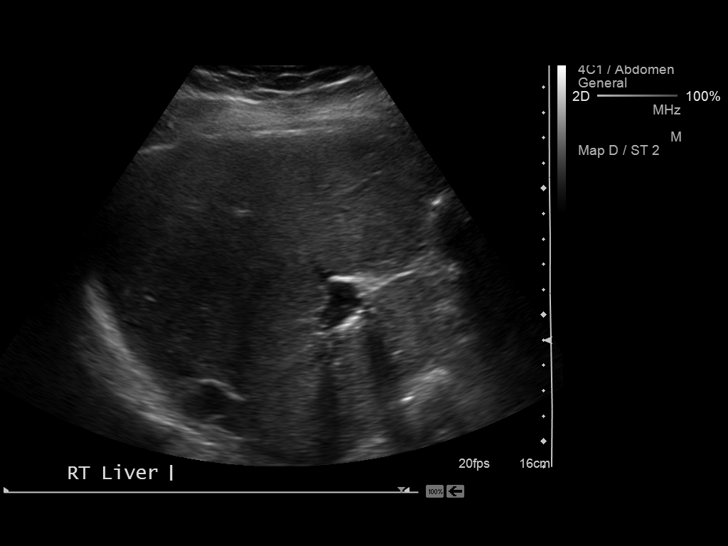
[im 11/65]
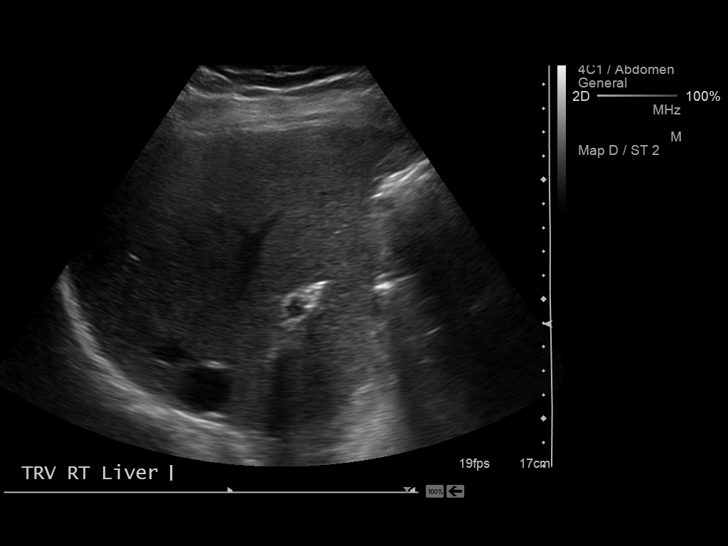
[im 17/65]
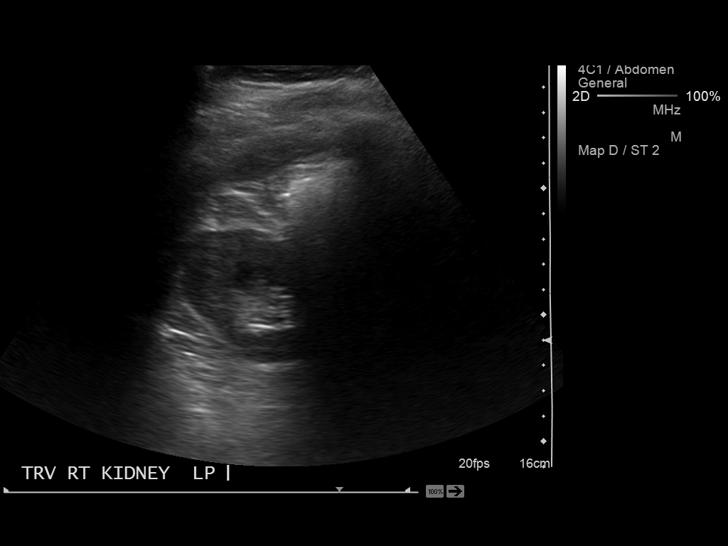
[im 22/65]
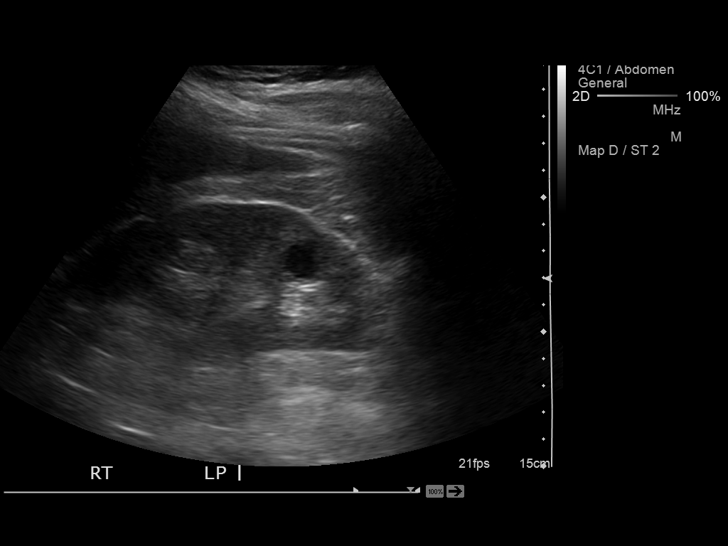
[im 27/65]
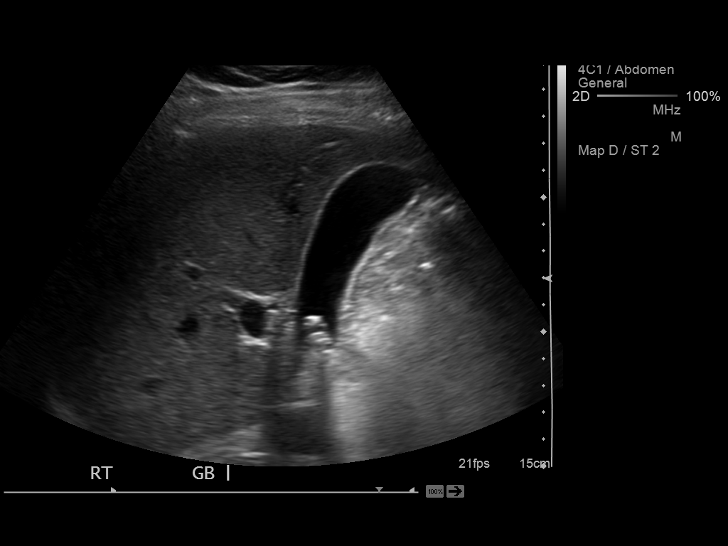
[im 33/65]
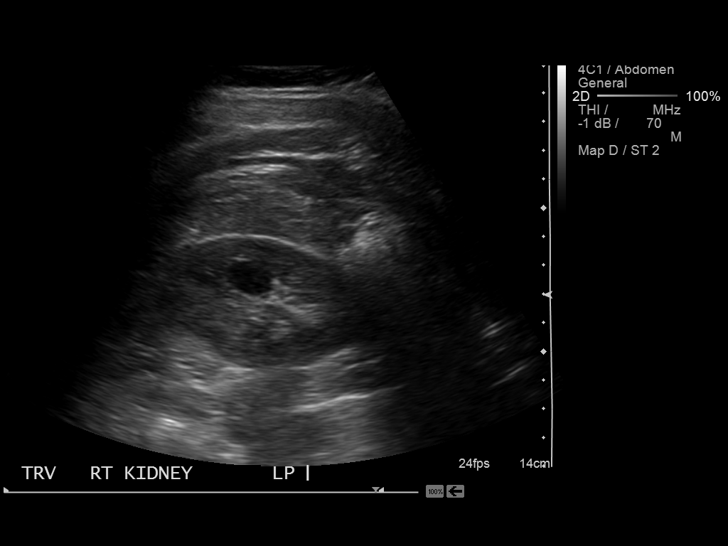
[im 38/65]
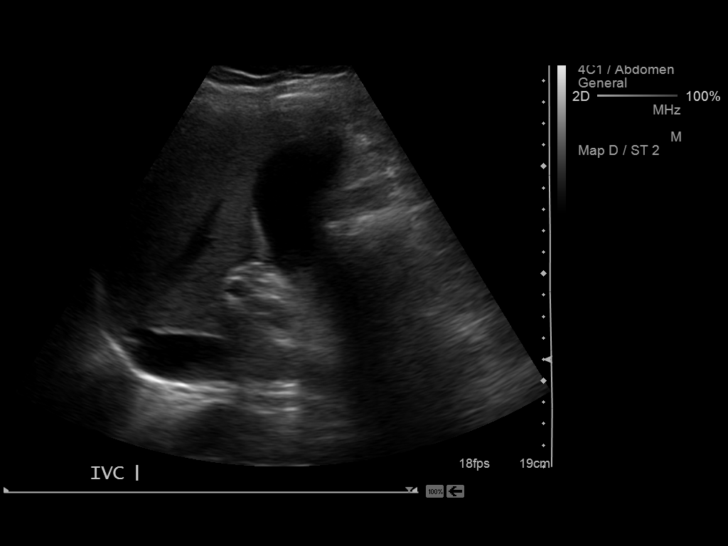
[im 43/65]
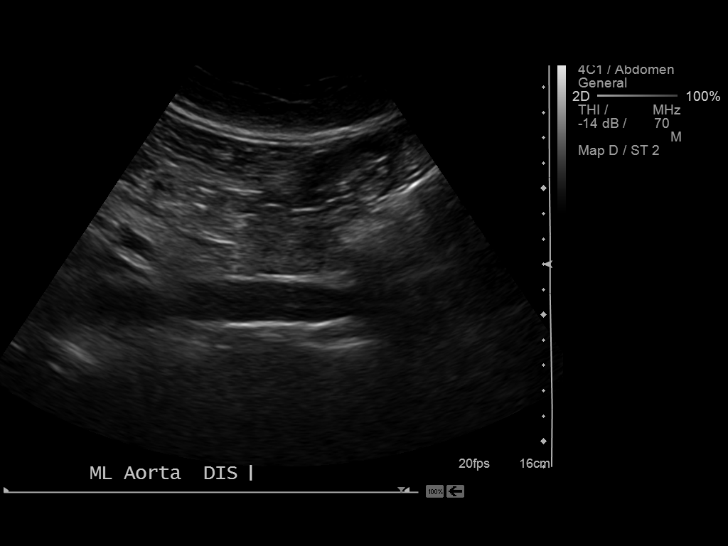
[im 49/65]
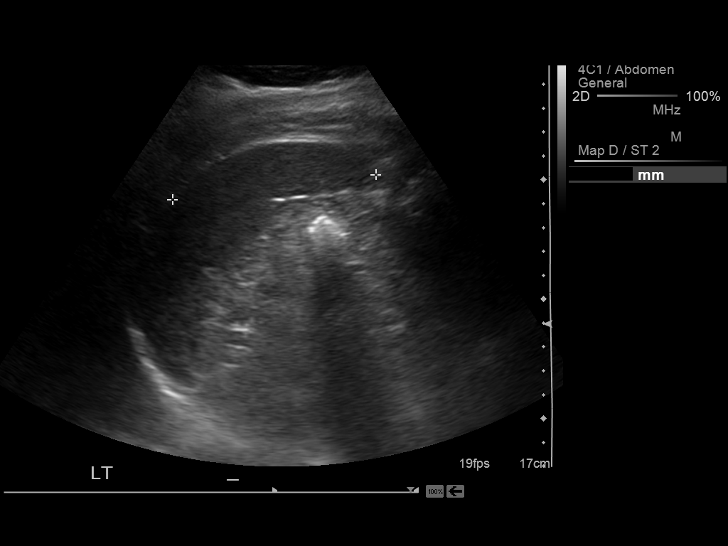
[im 54/65]
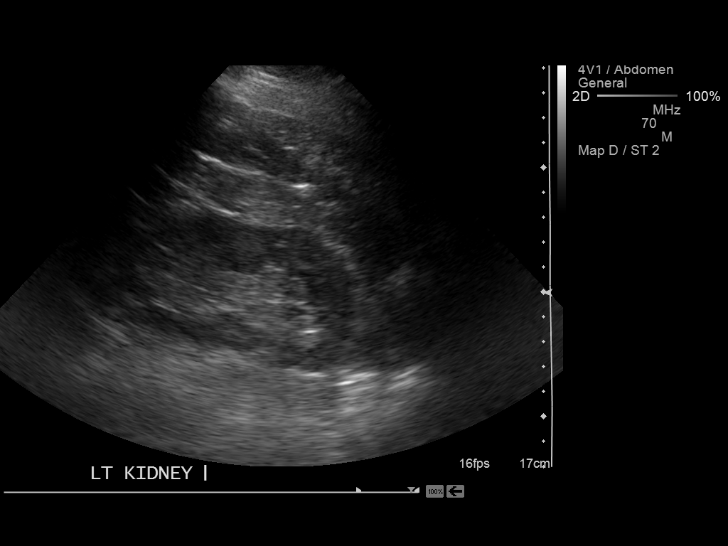
[im 59/65]
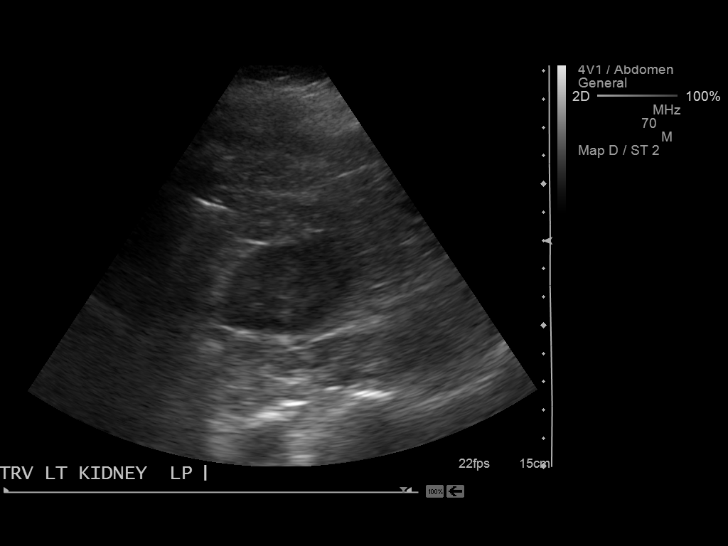
[im 65/65]
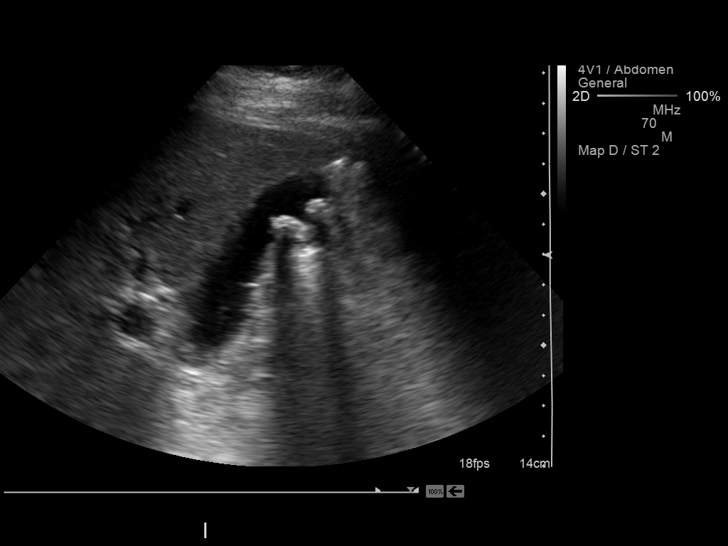

[13 of 25 positions shown; findings below may reference images not displayed]

FINDINGS: Gallbladder:  There are multiple gallstones.  One of the largest
gallstones visualized measures 15 mm.  Gallbladder wall thickness
is normal (1 mm).  The sonographic Murphy's sign is negative.

Common Bile Duct:  Within normal limits in caliber.  Measures 5 mm.

Liver: No focal mass lesion identified.  Within normal limits in
parenchymal echogenicity.

IVC:  Appears normal were visualized.

Pancreas:  The pancreas is not visualized, as it is obscured by
overlying bowel gas.

Spleen:  Within normal limits in size and echotexture. Measures
cm in sagittal length.

Right kidney:  Normal in size and parenchymal echogenicity.  No
evidence of  hydronephrosis. There is an 11 x 13 x 16 mm lower pole
simple cyst.

Left kidney:  Upper pole region is difficult to completely
visualize, due to some adjacent shadowing bowel gas. Normal in size
and parenchymal echogenicity.  No evidence of mass or
hydronephrosis.

Abdominal Aorta:  No aneurysm identified.
IMPRESSION: 1.  Cholelithiasis.
2.  Normal gallbladder wall thickness and negative sonographic
Murphy's sign.
3.  The pancreas and upper pole of the left kidney are difficult to
visualize due to bowel gas.

## 2012-03-09 IMAGING — CR DG CHEST 1V PORT
1 series · 1 of 1 positions shown · non-contrast
Comparison: Chest CT [DATE] chest radiograph [DATE]

CLINICAL DATA: Chest pain

PORTABLE CHEST - 1 VIEW

[AP]
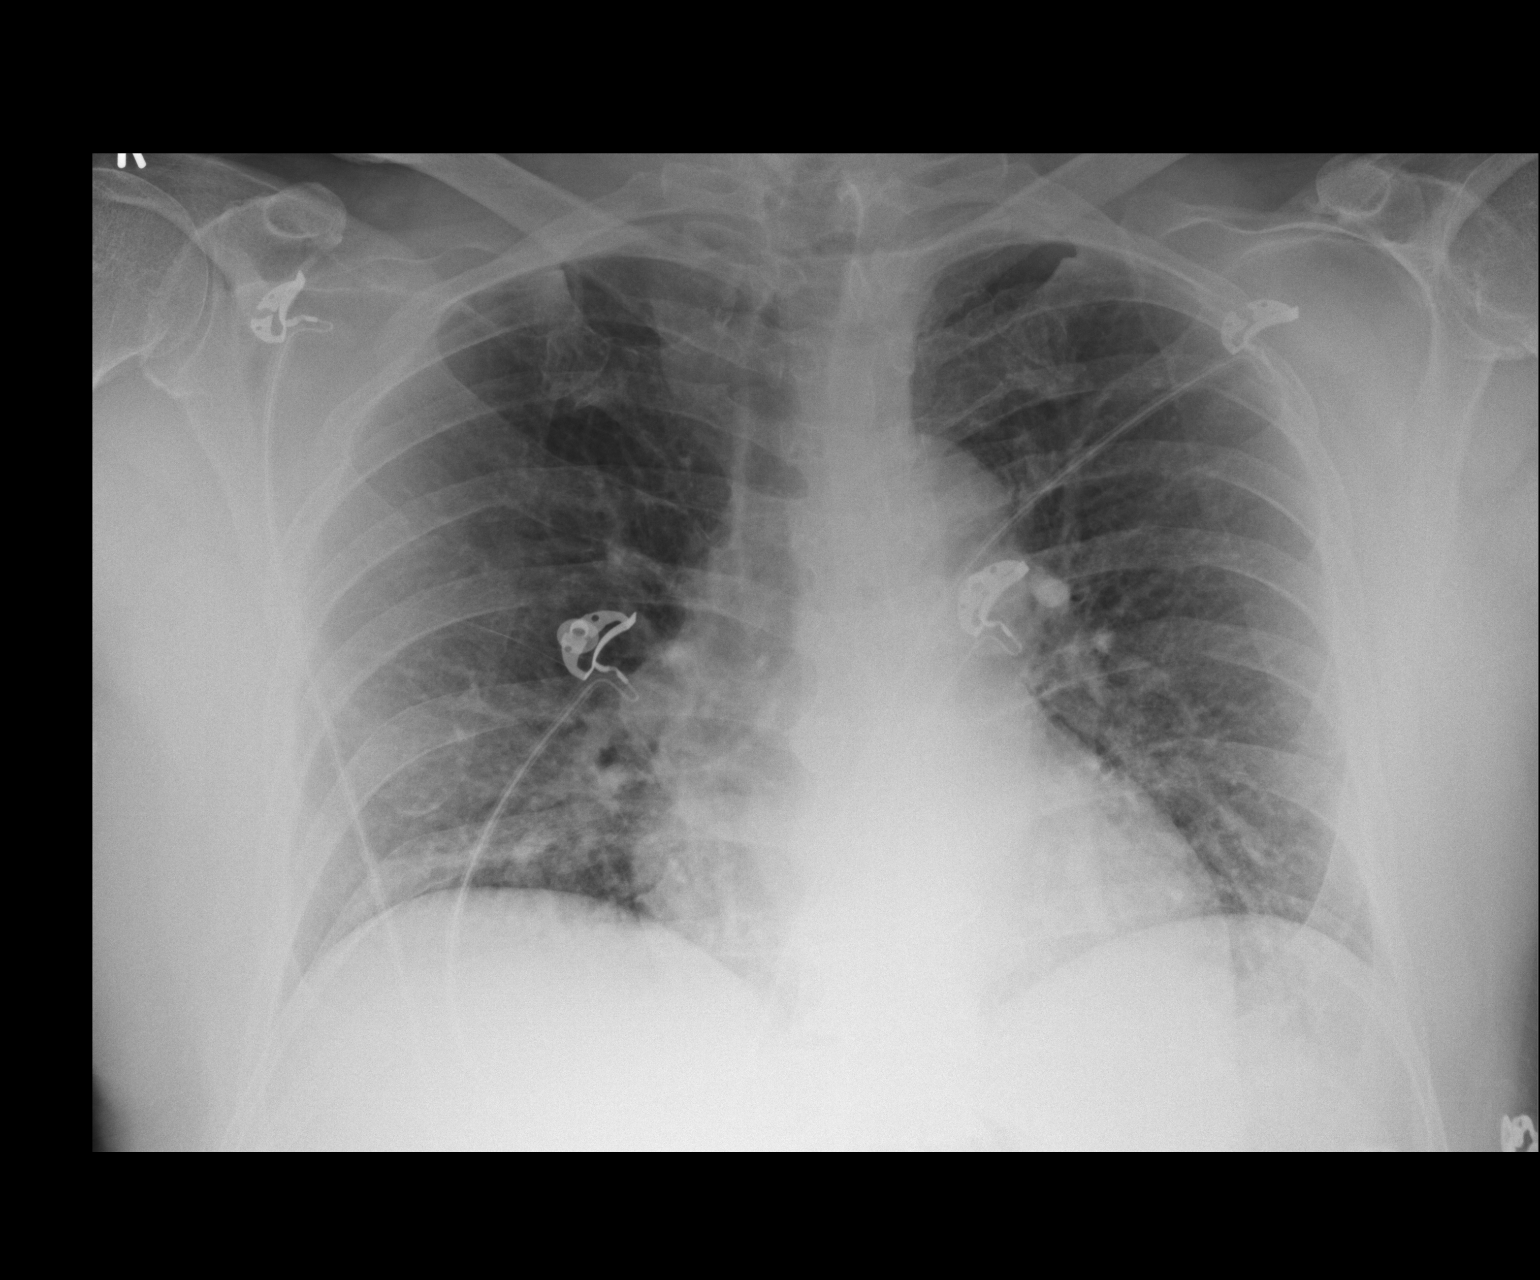

[1 of 1 positions shown; findings below may reference images not displayed]

FINDINGS: Heart size appears borderline enlarged and stable.
Thoracic aorta and hilar contours are stable.  Pulmonary
vascularity is normal.

The lung volumes are low.  There is bilateral peribronchial
thickening at the lung bases, which may be accentuated by low lung
volumes.  No focal opacities, edema, effusion, or visible
pneumothorax.  No acute bony abnormality.
IMPRESSION: Low lung volumes. Bilateral peribronchial thickening at the lung
bases appears chronic, and is likely accentuated by low lung
volumes. No definite acute abnormality.

## 2012-03-09 MED ORDER — SODIUM CHLORIDE 0.9 % IV SOLN
1000.0000 mL | INTRAVENOUS | Status: DC
  Administered 2012-03-09: 1000 mL via INTRAVENOUS

## 2012-03-09 MED ORDER — GUAIFENESIN-DM 100-10 MG/5ML PO SYRP
5.0000 mL | ORAL_SOLUTION | ORAL | Status: DC | PRN
  Filled 2012-03-09: qty 5

## 2012-03-09 MED ORDER — GLIMEPIRIDE 2 MG PO TABS
2.0000 mg | ORAL_TABLET | Freq: Every day | ORAL | Status: DC
  Administered 2012-03-10 – 2012-03-12 (×2): 2 mg via ORAL
  Filled 2012-03-09 (×5): qty 1

## 2012-03-09 MED ORDER — INSULIN ASPART 100 UNIT/ML ~~LOC~~ SOLN
0.0000 [IU] | Freq: Every day | SUBCUTANEOUS | Status: DC
  Administered 2012-03-11: 2 [IU] via SUBCUTANEOUS

## 2012-03-09 MED ORDER — ALBUTEROL SULFATE (5 MG/ML) 0.5% IN NEBU: 2.5000 mg | INHALATION_SOLUTION | RESPIRATORY_TRACT | Status: DC | PRN

## 2012-03-09 MED ORDER — MORPHINE SULFATE 2 MG/ML IJ SOLN
1.0000 mg | INTRAMUSCULAR | Status: DC | PRN
  Administered 2012-03-09: 1 mg via INTRAVENOUS
  Filled 2012-03-09: qty 1

## 2012-03-09 MED ORDER — CHLORHEXIDINE GLUCONATE 0.12 % MT SOLN
15.0000 mL | Freq: Two times a day (BID) | OROMUCOSAL | Status: DC
Start: 2012-03-09 — End: 2012-03-10
  Administered 2012-03-10: 15 mL via OROMUCOSAL
  Filled 2012-03-09 (×2): qty 15

## 2012-03-09 MED ORDER — INSULIN ASPART 100 UNIT/ML ~~LOC~~ SOLN
3.0000 [IU] | Freq: Three times a day (TID) | SUBCUTANEOUS | Status: DC
  Administered 2012-03-09 – 2012-03-10 (×2): 3 [IU] via SUBCUTANEOUS

## 2012-03-09 MED ORDER — HEPARIN SODIUM (PORCINE) 5000 UNIT/ML IJ SOLN
5000.0000 [IU] | Freq: Three times a day (TID) | INTRAMUSCULAR | Status: DC
  Administered 2012-03-09 – 2012-03-10 (×3): 5000 [IU] via SUBCUTANEOUS
  Filled 2012-03-09 (×9): qty 1

## 2012-03-09 MED ORDER — NITROGLYCERIN 2 % TD OINT
1.0000 [in_us] | TOPICAL_OINTMENT | Freq: Once | TRANSDERMAL | Status: AC
  Administered 2012-03-09: 1 [in_us] via TOPICAL
  Filled 2012-03-09: qty 1

## 2012-03-09 MED ORDER — ONDANSETRON HCL 4 MG PO TABS: 4.0000 mg | ORAL_TABLET | Freq: Four times a day (QID) | ORAL | Status: DC | PRN

## 2012-03-09 MED ORDER — KCL IN DEXTROSE-NACL 10-5-0.45 MEQ/L-%-% IV SOLN
INTRAVENOUS | Status: AC | PRN
  Administered 2012-03-10: via INTRAVENOUS
  Filled 2012-03-09: qty 1000

## 2012-03-09 MED ORDER — ONDANSETRON HCL 4 MG/2ML IJ SOLN
4.0000 mg | Freq: Four times a day (QID) | INTRAMUSCULAR | Status: DC | PRN
  Administered 2012-03-11: 4 mg via INTRAVENOUS

## 2012-03-09 MED ORDER — INSULIN ASPART 100 UNIT/ML ~~LOC~~ SOLN
0.0000 [IU] | Freq: Three times a day (TID) | SUBCUTANEOUS | Status: DC
  Administered 2012-03-10: 3 [IU] via SUBCUTANEOUS
  Administered 2012-03-10 – 2012-03-11 (×2): 2 [IU] via SUBCUTANEOUS
  Administered 2012-03-12: 3 [IU] via SUBCUTANEOUS

## 2012-03-09 MED ORDER — MORPHINE SULFATE 4 MG/ML IJ SOLN
4.0000 mg | Freq: Once | INTRAMUSCULAR | Status: AC
  Administered 2012-03-09: 4 mg via INTRAVENOUS
  Filled 2012-03-09: qty 1

## 2012-03-09 MED ORDER — SIMVASTATIN 10 MG PO TABS
10.0000 mg | ORAL_TABLET | Freq: Every day | ORAL | Status: DC
  Administered 2012-03-09 – 2012-03-11 (×3): 10 mg via ORAL
  Filled 2012-03-09 (×4): qty 1

## 2012-03-09 MED ORDER — DILTIAZEM HCL ER BEADS 300 MG PO CP24
300.0000 mg | ORAL_CAPSULE | Freq: Every day | ORAL | Status: DC
  Administered 2012-03-10 – 2012-03-12 (×3): 300 mg via ORAL
  Filled 2012-03-09 (×3): qty 1

## 2012-03-09 MED ORDER — FAMOTIDINE 40 MG PO TABS
40.0000 mg | ORAL_TABLET | Freq: Two times a day (BID) | ORAL | Status: DC
  Administered 2012-03-09 – 2012-03-12 (×5): 40 mg via ORAL
  Filled 2012-03-09 (×8): qty 1

## 2012-03-09 MED ORDER — BIOTENE DRY MOUTH MT LIQD: 15.0000 mL | Freq: Two times a day (BID) | OROMUCOSAL | Status: DC

## 2012-03-09 MED ORDER — SODIUM CHLORIDE 0.9 % IJ SOLN
3.0000 mL | Freq: Two times a day (BID) | INTRAMUSCULAR | Status: DC
  Administered 2012-03-09: 22:00:00 via INTRAVENOUS
  Administered 2012-03-11: 3 mL via INTRAVENOUS

## 2012-03-09 NOTE — Consult Note (Addendum)
General surgery attending note:  I have interviewed and examined this patient and discussed treatment plan with him and his family. He is feeling better with less pain this afternoon.  He gives a 6 week history of intermittent attacks of epigastric and right upper quadrant pain, but no nausea, vomiting, or change in his bowel habits. Didn't denies fatty food intolerance or postprandial characteristic of this pain.The pain does radiate to his back. He does radiate to the right  shoulder.  Liver function tests, lipase, CBC are normal. Ultrasound shows gallstones but no evidence of acute inflammation.  Exam of his abdomen this afternoon is unremarkable. No distention, no tenderness, no mass. No percussion tenderness of the right costal margin. He says it didn't bother him earlier in the day.  Assessment: Gallstones. I suspect he is having intermittent attacks of biliary colic. History of coronary artery disease, rule out ischemia as source of pain. Hypertension Diabetes history penile prosthesis  Plan: The patient will be admitted and undergo medical and cardiology evaluation. I have ordered lab work for tomorrow Assuming that he is able to be cleared medically, he would like to proceed with cholecystectomy this admission. I feel that is reasonable.  I have discussed the indications, details, techniques, and numerous risks of cholecystectomy with him and his family. He understands these issues. His questions are answered. He agrees with this plan. We will follow closely as his medical workup proceeds.   Angelia Mould. Derrell Lolling, M.D., [facility] General and Minimally invasive Surgery Breast and Colorectal Surgery Office:   [phone] Pager:   [phone]

## 2012-03-09 NOTE — ED Notes (Signed)
The PA for cardiology is at bedside.

## 2012-03-09 NOTE — H&P (Signed)
[facility]                                                                                    Patient Demographics  William Hunter, is a 66 y.o. male  CSN: [id number]  MRN: [id number]  DOB - [birth year]  Admit Date - 03/09/2012  Outpatient Primary MD for the patient is Gaye Alken, MD   With History of -  Past Medical History  Diagnosis Date  . Hypertension   . Diabetes mellitus   . Coronary artery disease   . MI, old     at age 25  . Dyslipidemia       Past Surgical History  Procedure Date  . Cardiac catheterization     x 3 with NO stents   . Penile prosthesis implant     in for   Chief Complaint  Patient presents with  . Chest Pain     HPI  William Hunter  is a 66 y.o. male, with history of diabetes mellitus, dyslipidemia, coronary vasospastic disease with questionable MI in the past, presents to the hospital with a five-week history of dull intermittent right-sided chest/right upper quadrant abdominal pain, pain is intermittent and nonradiating, no aggravating or relieving factors, no associated symptoms, not related to exertion, patient had been seeing Dr. Chales Abrahams cardiologist and was about to have outpatient stress test however patient had an episode of intense pain at work today and was brought to the ER where his initial workup was suggestive of gallstones and possible chronic cholecystitis, surgery saw the patient and her going to do a HIDA scan, once patient has been seen and cleared by cardiology he might have a cholecystectomy. Initial EKG and chest x-ray unremarkable, point-of-care troponin 0.00    Review of Systems    In addition to the HPI above,  No Fever-chills, No Headache, No changes with Vision or hearing, No problems swallowing food or Liquids, No Chest pain, Cough or Shortness of Breath, As above Abdominal pain, No Nausea or Vommitting, Bowel movements are loose No Blood in stool or Urine, No dysuria, No new skin  rashes or bruises, No new joints pains-aches,  No new weakness, tingling, numbness in any extremity, No recent weight gain or loss, No polyuria, polydypsia or polyphagia, No significant Mental Stressors.  A full 10 point Review of Systems was done, except as stated above, all other Review of Systems were negative.   Social History History  Substance Use Topics  . Smoking status: Never Smoker   . Smokeless tobacco: Not on file  . Alcohol Use: No   Stat  Family History CAD in patient's father  Prior to Admission medications   Medication Sig Start Date End Date Taking? Authorizing Provider  diltiazem (TIAZAC) 300 MG 24 hr capsule Take 300 mg by mouth daily.   Yes Historical Provider, MD  Famotidine (PEPCID PO) Take 1 tablet by mouth daily.   Yes Historical Provider, MD  glimepiride (AMARYL) 4 MG tablet Take 4 mg by mouth daily before breakfast.   Yes Historical Provider, MD  Glucos-MSM-C-Mn-Ginger-Willow (GLUCOSAMINE MSM COMPLEX PO) Take 2 tablets by mouth daily.   Yes Historical Provider, MD  lovastatin (MEVACOR) 10 MG tablet Take 20 mg by mouth at bedtime.   Yes Historical Provider, MD    Allergies  Allergen Reactions  . Codeine Swelling  . Penicillins Swelling    And hives  . Wool Alcohol (Lanolin) Hives    Physical Exam  Vitals  Blood pressure 123/75, pulse 54, temperature 98 F (36.7 C), temperature source Oral, resp. rate 18, SpO2 99.00%.   1. General middle-aged Caucasian male lying in bed in NAD,  stat  2. Normal affect and insight, Not Suicidal or Homicidal, Awake Alert, Oriented X 3.  3. No F.N deficits, ALL C.Nerves Intact, Strength 5/5 all 4 extremities, Sensation intact all 4 extremities, Plantars down going.  4. Ears and Eyes appear Normal, Conjunctivae clear, PERRLA. Moist Oral Mucosa.  5. Supple Neck, No JVD, No cervical lymphadenopathy appriciated, No Carotid Bruits.  6. Symmetrical Chest wall movement, Good air movement bilaterally, CTAB.  7.  RRR, No Gallops, Rubs or Murmurs, No Parasternal Heave.  8. Positive Bowel Sounds, Abdomen Soft, possible mild right upper quadrant tenderness on deep palpation negative Murphy sign, No organomegaly appriciated,No rebound -guarding or rigidity.  9.  No Cyanosis, Normal Skin Turgor, No Skin Rash or Bruise.  10. Good muscle tone,  joints appear normal , no effusions, Normal ROM.  11. No Palpable Lymph Nodes in Neck or Axillae    Data Review  CBC  Lab 03/09/12 1021  WBC 8.0  HGB 14.6  HCT 42.2  PLT 209  MCV 86.3  MCH 29.9  MCHC 34.6  RDW 12.6  LYMPHSABS --  MONOABS --  EOSABS --  BASOSABS --  BANDABS --   ------------------------------------------------------------------------------------------------------------------  Chemistries   Lab 03/09/12 1021  NA 135  K 4.1  CL 100  CO2 26  GLUCOSE 240*  BUN 14  CREATININE 0.78  CALCIUM 9.5  MG --  AST 14  ALT 18  ALKPHOS 61  BILITOT 0.3   ------------------------------------------------------------------------------------------------------------------ CrCl is unknown because there is no height on file for the current visit. ------------------------------------------------------------------------------------------------------------------ No results found for this basename: TSH,T4TOTAL,FREET3,T3FREE,THYROIDAB in the last 72 hours   Coagulation profile  Lab 03/09/12 1021  INR 0.98  PROTIME --   ------------------------------------------------------------------------------------------------------------------- No results found for this basename: DDIMER:2 in the last 72 hours -------------------------------------------------------------------------------------------------------------------  Cardiac Enzymes No results found for this basename: CK:3,CKMB:3,TROPONINI:3,MYOGLOBIN:3 in the last 168 hours ------------------------------------------------------------------------------------------------------------------ No  components found with this basename: POCBNP:3      Imaging results:   US Abdomen Complete  03/09/2012  *RADIOLOGY REPORT*  Clinical Data:  Chest pain and right-sided abdominal pain.  ABDOMINAL ULTRASOUND COMPLETE  Comparison:  CT chest 08/12/2007  Findings:  Gallbladder:  There are multiple gallstones.  One of the largest gallstones visualized measures 15 mm.  Gallbladder wall thickness is normal (1 mm).  The sonographic Murphy's sign is negative.  Common Bile Duct:  Within normal limits in caliber.  Measures 5 mm.  Liver: No focal mass lesion identified.  Within normal limits in parenchymal echogenicity.  IVC:  Appears normal were visualized.  Pancreas:  The pancreas is not visualized, as it is obscured by overlying bowel gas.  Spleen:  Within normal limits in size and echotexture. Measures 8.6 cm in sagittal length.  Right kidney:  Normal in size and parenchymal echogenicity.  No evidence of  hydronephrosis. There is an 11 x 13 x 16 mm lower pole simple cyst.  Left kidney:  Upper pole region is difficult to completely visualize, due to some adjacent shadowing bowel  gas. Normal in size and parenchymal echogenicity.  No evidence of mass or hydronephrosis.  Abdominal Aorta:  No aneurysm identified.  IMPRESSION:  1.  Cholelithiasis. 2.  Normal gallbladder wall thickness and negative sonographic Murphy's sign. 3.  The pancreas and upper pole of the left kidney are difficult to visualize due to bowel gas.   Original Report Authenticated By: Britta Mccreedy, M.D.    Dg Chest Portable 1 View  03/09/2012  *RADIOLOGY REPORT*  Clinical Data: Chest pain  PORTABLE CHEST - 1 VIEW  Comparison: Chest CT 08/12/2007 chest radiograph 08/12/2007  Findings: Heart size appears borderline enlarged and stable. Thoracic aorta and hilar contours are stable.  Pulmonary vascularity is normal.  The lung volumes are low.  There is bilateral peribronchial thickening at the lung bases, which may be accentuated by low lung volumes.  No  focal opacities, edema, effusion, or visible pneumothorax.  No acute bony abnormality.  IMPRESSION: Low lung volumes. Bilateral peribronchial thickening at the lung bases appears chronic, and is likely accentuated by low lung volumes. No definite acute abnormality.   Original Report Authenticated By: Britta Mccreedy, M.D.     My personal review of EKG: Rhythm NSR, Rate 70/min, no Acute ST changes    Assessment & Plan  1. Right upper quadrant abdominal/right-sided chest pain - Pain more suggestive of gallbladder pathology, chest x-ray, EKG, initial point-of-care troponin unremarkable, history not classic of coronary angina, will cycle troponins, HIDA scan to be done, cardiology to see the patient, once cleared by cardiology patient possibly might have cholecystectomy as per surgery's note who have already seen the patient.   2. History of coronary vasospastic disease and hypertension- continue diltiazem, try to avoid beta blockers.   3. History of dyslipidemia continue home dose statin.   DVT Prophylaxis Heparin   AM Labs Ordered, also please review Full Orders  Family Communication: Admission, patients condition and plan of care including tests being ordered have been discussed with the patient and family who indicate understanding and agree with the plan and Code Status.  Code Status full  Disposition Plan: Home  Time spent in minutes : 35  Condition fair  Susa Raring K M.D on 03/09/2012 at 4:37 PM  Between 7am to 7pm - Pager - [phone]  After 7pm go to [email or web address] - password [email or web address]  And look for the night coverage person covering me after hours  [facility] Office  [phone]

## 2012-03-09 NOTE — ED Notes (Addendum)
Pt to room 36 via [facility] with c/o chest pain for over 6-7 weeks. Pt was seen earlier last week at the cardiologist office and is scheduled for a stress test on Thursday. Pain is constant but got worse this morning with SOB. EMS gave ASA 324 mg and NTG x2 with 20g to Left Hand. NSR on the monitor with HR 78, initial BP 180/100, then 144/67. CBG 308.

## 2012-03-09 NOTE — ED Notes (Signed)
Dr. Ingram at the bedside.

## 2012-03-09 NOTE — ED Notes (Signed)
Dr. J. Knapp at the bedside.  

## 2012-03-09 NOTE — ED Notes (Signed)
Family stepped out to mention that the pt's BP was reading low. This RN at the bedside to recheck pressure. Pt stated that right before it took the BP that read low his right hand started becoming numb. He states, "i just wanted you to know about it".

## 2012-03-09 NOTE — ED Notes (Signed)
Pt transported to US

## 2012-03-09 NOTE — ED Provider Notes (Signed)
History     CSN: [id number]  Arrival date & time 03/09/12  4098   First MD Initiated Contact with Patient 03/09/12 1004      Chief Complaint  Patient presents with  . Chest Pain    HPI Comments: Pt had an episode that was very severe today.  The pain is subsiding.  Located center to right in the chest. Pt has seen a cardiologist for this same problem.  Dr Chales Abrahams on Friday.  He has plans for a stress test this Friday.  Radiates from upper abdomen to shoulder blade.  Patient is a 66 y.o. male presenting with chest pain. The history is provided by the patient.  Chest Pain Episode onset: 7 weeks ago. The severity of the pain is severe. The quality of the pain is described as sharp. Primary symptoms include shortness of breath and palpitations. Pertinent negatives for primary symptoms include no fever, no cough, no abdominal pain, no nausea and no vomiting.  The palpitations also occurred with shortness of breath. He tried nitroglycerin and aspirin for the symptoms.  His past medical history is significant for CAD.  Pertinent negatives for past medical history include no MI and no PE. Past medical history comments: history of "coronary spasm"  His family medical history is significant for CAD in family and PE in family.     No past medical history on file.  No past surgical history on file.  No family history on file.  History  Substance Use Topics  . Smoking status: Not on file  . Smokeless tobacco: Not on file  . Alcohol Use: Not on file      Review of Systems  Constitutional: Negative for fever.  Respiratory: Positive for shortness of breath. Negative for cough.   Cardiovascular: Positive for chest pain and palpitations.  Gastrointestinal: Negative for nausea, vomiting and abdominal pain.  All other systems reviewed and are negative.    Allergies  Review of patient's allergies indicates not on file.  Home Medications  No current outpatient prescriptions on  file.  There were no vitals taken for this visit.  Physical Exam  Nursing note and vitals reviewed. Constitutional: He appears well-developed and well-nourished. No distress.  HENT:  Head: Normocephalic and atraumatic.  Right Ear: External ear normal.  Left Ear: External ear normal.  Eyes: Conjunctivae normal are normal. Right eye exhibits no discharge. Left eye exhibits no discharge. No scleral icterus.  Neck: Neck supple. No tracheal deviation present.  Cardiovascular: Normal rate, regular rhythm and intact distal pulses.   Pulmonary/Chest: Effort normal and breath sounds normal. No stridor. No respiratory distress. He has no wheezes. He has no rales.  Abdominal: Soft. Bowel sounds are normal. He exhibits no distension. There is no tenderness. There is no rebound and no guarding.  Musculoskeletal: He exhibits no edema and no tenderness.  Neurological: He is alert. He has normal strength. No sensory deficit. Cranial nerve deficit:  no gross defecits noted. He exhibits normal muscle tone. He displays no seizure activity. Coordination normal.  Skin: Skin is warm and dry. No rash noted.  Psychiatric: He has a normal mood and affect.    ED Course  Procedures (including critical care time)  Rate: 70  Rhythm: normal sinus rhythm  QRS Axis: normal  Intervals: normal  ST/T Wave abnormalities: normal  Conduction Disutrbances:none  Narrative Interpretation: nl  Old EKG Reviewed: no changes  Labs Reviewed  COMPREHENSIVE METABOLIC PANEL - Abnormal; Notable for the following:    Glucose, Bld  240 (*)     All other components within normal limits  CBC  PROTIME-INR  APTT  LIPASE, BLOOD  POCT I-STAT TROPONIN I   US Abdomen Complete  03/09/2012  *RADIOLOGY REPORT*  Clinical Data:  Chest pain and right-sided abdominal pain.  ABDOMINAL ULTRASOUND COMPLETE  Comparison:  CT chest 08/12/2007  Findings:  Gallbladder:  There are multiple gallstones.  One of the largest gallstones visualized  measures 15 mm.  Gallbladder wall thickness is normal (1 mm).  The sonographic Murphy's sign is negative.  Common Bile Duct:  Within normal limits in caliber.  Measures 5 mm.  Liver: No focal mass lesion identified.  Within normal limits in parenchymal echogenicity.  IVC:  Appears normal were visualized.  Pancreas:  The pancreas is not visualized, as it is obscured by overlying bowel gas.  Spleen:  Within normal limits in size and echotexture. Measures 8.6 cm in sagittal length.  Right kidney:  Normal in size and parenchymal echogenicity.  No evidence of  hydronephrosis. There is an 11 x 13 x 16 mm lower pole simple cyst.  Left kidney:  Upper pole region is difficult to completely visualize, due to some adjacent shadowing bowel gas. Normal in size and parenchymal echogenicity.  No evidence of mass or hydronephrosis.  Abdominal Aorta:  No aneurysm identified.  IMPRESSION:  1.  Cholelithiasis. 2.  Normal gallbladder wall thickness and negative sonographic Murphy's sign. 3.  The pancreas and upper pole of the left kidney are difficult to visualize due to bowel gas.   Original Report Authenticated By: Britta Mccreedy, M.D.    Dg Chest Portable 1 View  03/09/2012  *RADIOLOGY REPORT*  Clinical Data: Chest pain  PORTABLE CHEST - 1 VIEW  Comparison: Chest CT 08/12/2007 chest radiograph 08/12/2007  Findings: Heart size appears borderline enlarged and stable. Thoracic aorta and hilar contours are stable.  Pulmonary vascularity is normal.  The lung volumes are low.  There is bilateral peribronchial thickening at the lung bases, which may be accentuated by low lung volumes.  No focal opacities, edema, effusion, or visible pneumothorax.  No acute bony abnormality.  IMPRESSION: Low lung volumes. Bilateral peribronchial thickening at the lung bases appears chronic, and is likely accentuated by low lung volumes. No definite acute abnormality.   Original Report Authenticated By: Britta Mccreedy, M.D.      MDM  Patient has been  having recurrent episodes of chest pain.  He's been seen by [facility] cardiology.  He has plans for a stress test this week.  Patient had another episode however that was severe they brought him to the emergency department today. He is mentioned the pain is been on the right side. His evaluation today shows no sign of any acute cardiac abnormality.    The ultrasound does show cholelithiasis without cholecystitis. It is possible the patient could be having biliary colic as the source of some of the symptoms. He has ttp in his RUQ still.  Will consult general surgery.  I will consult with [facility] cardiology regarding further evaluation as the patient may require cardiac stress testing vs clearance before any surgical intervention.        Celene Kras, MD 03/09/12 1310

## 2012-03-09 NOTE — Consult Note (Addendum)
Admit date: 03/09/2012 Referring Physician  [facility] Primary Physician  [facility] Primary Cardiologist  [facility] Leia Alf, MD Reason for Consultation  Prep clearance  ASSESSMENT: 1.Gall bladder disease 2. History of coronary artery spasm 3. Cardiac clearance for non cardiac surgery 4. Diabetes mellitus 5. Hypertension 6. Hyperlipidemia  PLAN: 1. Stress cardiolite in AM. Clearance for surgery is pending low risk assessment on nuclear study.  2. Stop NTG patch/ointment   HPI: The patient has a 4 to six-week history of right epigastric/upper quadrant discomfort that radiates into the right arm and into the right neck. He saw my partner Dr. Anne Fu 03/06/2012. He felt that he the atypical chest pain including right upper quadrant discomfort could include biliary colic in the differential. The patient was schedule an exercise treadmill test to rule out coronary artery disease progression. He was admitted to the hospital now after coming to the emergency room complaining of chest discomfort. Abdominal ultrasound demonstrated cholelithiasis and it has been recommended by general surgery that he undergo cholecystectomy. We asked to clear the patient for that procedure.  PMH:   Past Medical History  Diagnosis Date  . Hypertension   . Diabetes mellitus   . Coronary artery disease   . MI, old     at age 30  . Dyslipidemia      PSH:   Past Surgical History  Procedure Date  . Cardiac catheterization     x 3 with NO stents   . Penile prosthesis implant     Allergies:  Codeine; Penicillins; and Wool alcohol Prior to Admit Meds:   Prescriptions prior to admission  Medication Sig Dispense Refill  . diltiazem (TIAZAC) 300 MG 24 hr capsule Take 300 mg by mouth daily.      . Famotidine (PEPCID PO) Take 1 tablet by mouth daily.      Marland Kitchen glimepiride (AMARYL) 4 MG tablet Take 4 mg by mouth daily before breakfast.      . Glucos-MSM-C-Mn-Ginger-Willow (GLUCOSAMINE MSM COMPLEX PO)  Take 2 tablets by mouth daily.      Marland Kitchen lovastatin (MEVACOR) 10 MG tablet Take 20 mg by mouth at bedtime.       Fam HX:   No family history on file. Social HX:    History   Social History  . Marital Status: Married    Spouse Name: N/A    Number of Children: N/A  . Years of Education: N/A   Occupational History  . Not on file.   Social History Main Topics  . Smoking status: Never Smoker   . Smokeless tobacco: Not on file  . Alcohol Use: No  . Drug Use: No  . Sexually Active:    Other Topics Concern  . Not on file   Social History Narrative  . No narrative on file     Review of Systems: As outlined above. He denies orthopnea, PND, and edema.  Physical Exam: Blood pressure 123/75, pulse 54, temperature 98 F (36.7 C), temperature source Oral, resp. rate 18, SpO2 99.00%. Weight change:   Patient is well-developed well-nourished. He is in no acute distress.  Chest is clear  Cardiac exam is unremarkable  Mild right upper quadrant tenderness on abdominal exam. Bowel sounds are normal.  Extremities no edema. Pulses are 2+ and symmetric.  Neurological exam is intact to Labs:   Lab Results  Component Value Date   WBC 9.1 03/09/2012   HGB 14.8 03/09/2012   HCT 41.9 03/09/2012   MCV 86.0 03/09/2012  PLT 210 03/09/2012    Lab 03/09/12 1844 03/09/12 1021  NA -- 135  K -- 4.1  CL -- 100  CO2 -- 26  BUN -- 14  CREATININE 0.77 --  CALCIUM -- 9.5  PROT -- 7.4  BILITOT -- 0.3  ALKPHOS -- 61  ALT -- 18  AST -- 14  GLUCOSE -- 240*   No results found for this basename: PTT   Lab Results  Component Value Date   INR 1.01 03/09/2012   INR 0.98 03/09/2012   Lab Results  Component Value Date   CKTOTAL 125 08/07/2007   CKMB 1.8 08/07/2007   TROPONINI <0.30 03/09/2012     Lab Results  Component Value Date   CHOL  Value: 158        ATP III CLASSIFICATION:  <200     mg/dL   Desirable  161-096  mg/dL   Borderline High  >=045    mg/dL   High 09/16/8117   Lab Results    Component Value Date   HDL 32* 08/06/2007   Lab Results  Component Value Date   LDLCALC  Value: 94        Total Cholesterol/HDL:CHD Risk Coronary Heart Disease Risk Table                     Men   Women  1/2 Average Risk   3.4   3.3 08/06/2007   Lab Results  Component Value Date   TRIG 161* 08/06/2007   Lab Results  Component Value Date   CHOLHDL 4.9 08/06/2007   No results found for this basename: LDLDIRECT      Radiology:  US Abdomen Complete  03/09/2012  *RADIOLOGY REPORT*  Clinical Data:  Chest pain and right-sided abdominal pain.  ABDOMINAL ULTRASOUND COMPLETE  Comparison:  CT chest 08/12/2007  Findings:  Gallbladder:  There are multiple gallstones.  One of the largest gallstones visualized measures 15 mm.  Gallbladder wall thickness is normal (1 mm).  The sonographic Murphy's sign is negative.  Common Bile Duct:  Within normal limits in caliber.  Measures 5 mm.  Liver: No focal mass lesion identified.  Within normal limits in parenchymal echogenicity.  IVC:  Appears normal were visualized.  Pancreas:  The pancreas is not visualized, as it is obscured by overlying bowel gas.  Spleen:  Within normal limits in size and echotexture. Measures 8.6 cm in sagittal length.  Right kidney:  Normal in size and parenchymal echogenicity.  No evidence of  hydronephrosis. There is an 11 x 13 x 16 mm lower pole simple cyst.  Left kidney:  Upper pole region is difficult to completely visualize, due to some adjacent shadowing bowel gas. Normal in size and parenchymal echogenicity.  No evidence of mass or hydronephrosis.  Abdominal Aorta:  No aneurysm identified.  IMPRESSION:  1.  Cholelithiasis. 2.  Normal gallbladder wall thickness and negative sonographic Murphy's sign. 3.  The pancreas and upper pole of the left kidney are difficult to visualize due to bowel gas.   Original Report Authenticated By: Britta Mccreedy, M.D.    Dg Chest Portable 1 View  03/09/2012  *RADIOLOGY REPORT*  Clinical Data: Chest pain   PORTABLE CHEST - 1 VIEW  Comparison: Chest CT 08/12/2007 chest radiograph 08/12/2007  Findings: Heart size appears borderline enlarged and stable. Thoracic aorta and hilar contours are stable.  Pulmonary vascularity is normal.  The lung volumes are low.  There is bilateral peribronchial thickening at the lung bases,  which may be accentuated by low lung volumes.  No focal opacities, edema, effusion, or visible pneumothorax.  No acute bony abnormality.  IMPRESSION: Low lung volumes. Bilateral peribronchial thickening at the lung bases appears chronic, and is likely accentuated by low lung volumes. No definite acute abnormality.   Original Report Authenticated By: Britta Mccreedy, M.D.    EKG:  Normal    Lesleigh Noe 03/09/2012 7:49 PM

## 2012-03-09 NOTE — Progress Notes (Signed)
Disposition Note  William Hunter, is a 66 y.o. male,   MRN: [id number]  -  DOB - 1945/11/10  Outpatient Primary MD for the patient is Gaye Alken, MD   Blood pressure 106/65, pulse 60, temperature 99.2 F (37.3 C), temperature source Oral, resp. rate 18, SpO2 97.00%.  Principal Problem:  *Abdominal pain  66 yo male, patient of Dr. Anne Fu with history of MI years ago, DM, HTN presents to the ED with upper abdominal pain for the last 6 weeks.  Ultrasound shows gall stones.  Surgery has been consulted and recommends cholecystectomy but needs cardiac clearance in order to proceed.  Of note, the patient has been evaluated recently by [facility] cardiology for this same pain, due to a h/o CAD. He is scheduled for a nuclear stress test this Thursday to rule out a cardiac origin.  I have requested a tele bed and [facility] consultation for Cardiac Clearance before Biliary Surgery.  Algis Downs, PA-C [facility] Pager: [phone]

## 2012-03-09 NOTE — Consult Note (Signed)
William Hunter Oct 21, 1945  [id number].   Primary Cardiologist: Dr. Anne Fu Requesting MD: Dr. Linwood Dibbles Chief Complaint/Reason for Consult: gallstones HPI: This is a 66 yo male with a history of probably MI 32 years ago with HTN and DM.  For the last 6 weeks, he has had a pain in the upper abdomen that radiates to his back and chest.  He denies any nausea or vomiting.  He has had intermittent diarrhea, but this has been present for longer than the last 6 weeks.  His pain is sharp at times and then feels like a spasm.  Food does not effect his pain.    The current episode started this morning at 0815am.  It persisted and the patient decided to come to [facility] for further evaluation.  He has normal labs, but his ultrasound revealed gallstones.  We were asked to see the patient for possible surgical intervention.    Of note, the patient has been evaluated recently by [facility] cardiology for this same pain, due to a h/o CAD.  He is scheduled for a nuclear stress test this Thursday to rule out a cardiac origin.  Review of Systems:  Please see HPI, otherwise all other systems are currently negative.  No family history on file.  Past Medical History  Diagnosis Date  . Hypertension   . Diabetes mellitus   . Coronary artery disease   . MI, old     at age 28    Past Surgical History  Procedure Date  . Cardiac catheterization     x 3 with NO stents   . Penile prosthesis implant     Social History:  reports that he has never smoked. He does not have any smokeless tobacco history on file. He reports that he does not drink alcohol or use illicit drugs.  Allergies:  Allergies  Allergen Reactions  . Codeine Swelling  . Penicillins Swelling    And hives  . Wool Alcohol (Lanolin) Hives     (Not in a hospital admission)  Blood pressure 106/65, pulse 60, temperature 99.2 F (37.3 C), temperature source Oral, resp. rate 18, SpO2 97.00%. Physical Exam: General: pleasant, WD, WN white male who is  laying in bed in NAD HEENT: head is normocephalic, atraumatic.  Sclera are noninjected.  PERRL.  Ears and nose without any masses or lesions.  Mouth is pink and moist Heart: regular, rate, and rhythm.  Normal s1,s2. No obvious murmurs, gallops, or rubs noted.  Palpable radial and pedal pulses bilaterally Lungs: CTAB, no wheezes, rhonchi, or rales noted.  Respiratory effort nonlabored Abd: soft, tender in RUQ the most, epigastrium, LUQ as well, ND, +BS, no masses, hernias, or organomegaly MS: all 4 extremities are symmetrical with no cyanosis, clubbing, or edema. Skin: warm and dry with no masses, lesions, or rashes Psych: A&Ox3 with an appropriate affect.    Results for orders placed during the hospital encounter of 03/09/12 (from the past 48 hour(s))  CBC     Status: Normal   Collection Time   03/09/12 10:21 AM      Component Value Range Comment   WBC 8.0  4.0 - 10.5 K/uL    RBC 4.89  4.22 - 5.81 MIL/uL    Hemoglobin 14.6  13.0 - 17.0 g/dL    HCT 40.9  81.1 - 91.4 %    MCV 86.3  78.0 - 100.0 fL    MCH 29.9  26.0 - 34.0 pg    MCHC 34.6  30.0 -  36.0 g/dL    RDW 40.9  81.1 - 91.4 %    Platelets 209  150 - 400 K/uL   COMPREHENSIVE METABOLIC PANEL     Status: Abnormal   Collection Time   03/09/12 10:21 AM      Component Value Range Comment   Sodium 135  135 - 145 mEq/L    Potassium 4.1  3.5 - 5.1 mEq/L    Chloride 100  96 - 112 mEq/L    CO2 26  19 - 32 mEq/L    Glucose, Bld 240 (*) 70 - 99 mg/dL    BUN 14  6 - 23 mg/dL    Creatinine, Ser 7.82  0.50 - 1.35 mg/dL    Calcium 9.5  8.4 - 95.6 mg/dL    Total Protein 7.4  6.0 - 8.3 g/dL    Albumin 3.7  3.5 - 5.2 g/dL    AST 14  0 - 37 U/L    ALT 18  0 - 53 U/L    Alkaline Phosphatase 61  39 - 117 U/L    Total Bilirubin 0.3  0.3 - 1.2 mg/dL    GFR calc non Af Amer >90  >90 mL/min    GFR calc Af Amer >90  >90 mL/min   PROTIME-INR     Status: Normal   Collection Time   03/09/12 10:21 AM      Component Value Range Comment   Prothrombin  Time 12.9  11.6 - 15.2 seconds    INR 0.98  0.00 - 1.49   APTT     Status: Normal   Collection Time   03/09/12 10:21 AM      Component Value Range Comment   aPTT 27  24 - 37 seconds   LIPASE, BLOOD     Status: Normal   Collection Time   03/09/12 10:21 AM      Component Value Range Comment   Lipase 38  11 - 59 U/L   POCT I-STAT TROPONIN I     Status: Normal   Collection Time   03/09/12 10:37 AM      Component Value Range Comment   Troponin i, poc 0.00  0.00 - 0.08 ng/mL    Comment 3             US Abdomen Complete  03/09/2012  *RADIOLOGY REPORT*  Clinical Data:  Chest pain and right-sided abdominal pain.  ABDOMINAL ULTRASOUND COMPLETE  Comparison:  CT chest 08/12/2007  Findings:  Gallbladder:  There are multiple gallstones.  One of the largest gallstones visualized measures 15 mm.  Gallbladder wall thickness is normal (1 mm).  The sonographic Murphy's sign is negative.  Common Bile Duct:  Within normal limits in caliber.  Measures 5 mm.  Liver: No focal mass lesion identified.  Within normal limits in parenchymal echogenicity.  IVC:  Appears normal were visualized.  Pancreas:  The pancreas is not visualized, as it is obscured by overlying bowel gas.  Spleen:  Within normal limits in size and echotexture. Measures 8.6 cm in sagittal length.  Right kidney:  Normal in size and parenchymal echogenicity.  No evidence of  hydronephrosis. There is an 11 x 13 x 16 mm lower pole simple cyst.  Left kidney:  Upper pole region is difficult to completely visualize, due to some adjacent shadowing bowel gas. Normal in size and parenchymal echogenicity.  No evidence of mass or hydronephrosis.  Abdominal Aorta:  No aneurysm identified.  IMPRESSION:  1.  Cholelithiasis. 2.  Normal gallbladder wall thickness and negative sonographic Murphy's sign. 3.  The pancreas and upper pole of the left kidney are difficult to visualize due to bowel gas.   Original Report Authenticated By: Britta Mccreedy, M.D.    Dg Chest Portable  1 View  03/09/2012  *RADIOLOGY REPORT*  Clinical Data: Chest pain  PORTABLE CHEST - 1 VIEW  Comparison: Chest CT 08/12/2007 chest radiograph 08/12/2007  Findings: Heart size appears borderline enlarged and stable. Thoracic aorta and hilar contours are stable.  Pulmonary vascularity is normal.  The lung volumes are low.  There is bilateral peribronchial thickening at the lung bases, which may be accentuated by low lung volumes.  No focal opacities, edema, effusion, or visible pneumothorax.  No acute bony abnormality.  IMPRESSION: Low lung volumes. Bilateral peribronchial thickening at the lung bases appears chronic, and is likely accentuated by low lung volumes. No definite acute abnormality.   Original Report Authenticated By: Britta Mccreedy, M.D.        Assessment/Plan 1. Abdominal pain, radiates to chest 2. Cholelithiasis 3. H/o CAD 4. HTN 5. DM  Plan: I have discussed this patient with Dr. Derrell Lolling.  We feel that if cardiology is willing to do nuclear stress test while here to clear the patient from a cardiac standpoint, that we could proceed with cholecystectomy if not other etiology is found.  Dr. Derrell Lolling would like to order a HIDA scan in the meantime, but we suspect that the patient's symptoms are likely biliary in origin.  We will follow the patient and await for cardiac clearance.  Theodore Virgin E 03/09/2012, 1:26 PM Pager: [phone]

## 2012-03-10 ENCOUNTER — Inpatient Hospital Stay (HOSPITAL_COMMUNITY): Payer: BC Managed Care – PPO

## 2012-03-10 DIAGNOSIS — K8020 Calculus of gallbladder without cholecystitis without obstruction: Secondary | ICD-10-CM

## 2012-03-10 LAB — CBC
HCT: 42 % (ref 39.0–52.0)
Hemoglobin: 14.4 g/dL (ref 13.0–17.0)
Hemoglobin: 14.8 g/dL (ref 13.0–17.0)
MCH: 30.3 pg (ref 26.0–34.0)
MCV: 86.9 fL (ref 78.0–100.0)
MCV: 85.9 fL (ref 78.0–100.0)
Platelets: 195 10*3/uL (ref 150–400)
RBC: 4.72 MIL/uL (ref 4.22–5.81)
RBC: 4.89 MIL/uL (ref 4.22–5.81)
WBC: 8.3 10*3/uL (ref 4.0–10.5)
WBC: 7.5 10*3/uL (ref 4.0–10.5)

## 2012-03-10 LAB — COMPREHENSIVE METABOLIC PANEL
AST: 16 U/L (ref 0–37)
AST: 15 U/L (ref 0–37)
Albumin: 3.4 g/dL — ABNORMAL LOW (ref 3.5–5.2)
Albumin: 3.6 g/dL (ref 3.5–5.2)
Alkaline Phosphatase: 60 U/L (ref 39–117)
BUN: 9 mg/dL (ref 6–23)
CO2: 29 mEq/L (ref 19–32)
Calcium: 9.3 mg/dL (ref 8.4–10.5)
Chloride: 100 mEq/L (ref 96–112)
Chloride: 99 mEq/L (ref 96–112)
Creatinine, Ser: 0.82 mg/dL (ref 0.50–1.35)
Potassium: 3.7 mEq/L (ref 3.5–5.1)
Total Bilirubin: 0.6 mg/dL (ref 0.3–1.2)
Total Protein: 7.3 g/dL (ref 6.0–8.3)

## 2012-03-10 LAB — GLUCOSE, CAPILLARY
Glucose-Capillary: 206 mg/dL — ABNORMAL HIGH (ref 70–99)
Glucose-Capillary: 107 mg/dL — ABNORMAL HIGH (ref 70–99)

## 2012-03-10 LAB — MAGNESIUM: Magnesium: 2 mg/dL (ref 1.5–2.5)

## 2012-03-10 MED ORDER — TECHNETIUM TC 99M SESTAMIBI GENERIC - CARDIOLITE
30.0000 | Freq: Once | INTRAVENOUS | Status: AC | PRN
  Administered 2012-03-10: 30 via INTRAVENOUS

## 2012-03-10 MED ORDER — TECHNETIUM TC 99M SESTAMIBI GENERIC - CARDIOLITE
10.0000 | Freq: Once | INTRAVENOUS | Status: AC | PRN
  Administered 2012-03-10: 10 via INTRAVENOUS

## 2012-03-10 MED ORDER — CHLORHEXIDINE GLUCONATE 4 % EX LIQD
1.0000 "application " | Freq: Once | CUTANEOUS | Status: AC
  Administered 2012-03-11: 1 via TOPICAL
  Filled 2012-03-10: qty 15

## 2012-03-10 MED ORDER — SODIUM CHLORIDE 0.9 % IV SOLN
1000.0000 mL | INTRAVENOUS | Status: DC
  Administered 2012-03-10 – 2012-03-11 (×2): 1000 mL via INTRAVENOUS

## 2012-03-10 MED ORDER — LACTATED RINGERS IV SOLN: INTRAVENOUS | Status: DC

## 2012-03-10 MED ORDER — MIDAZOLAM HCL 2 MG/2ML IJ SOLN: 1.0000 mg | INTRAMUSCULAR | Status: DC | PRN

## 2012-03-10 MED ORDER — FENTANYL CITRATE 0.05 MG/ML IJ SOLN: 50.0000 ug | INTRAMUSCULAR | Status: DC | PRN

## 2012-03-10 MED ORDER — CIPROFLOXACIN IN D5W 400 MG/200ML IV SOLN
400.0000 mg | INTRAVENOUS | Status: AC
  Administered 2012-03-11: 400 mg via INTRAVENOUS
  Filled 2012-03-10 (×2): qty 200

## 2012-03-10 NOTE — Progress Notes (Signed)
Patient ID: William Hunter, male   DOB: 1945/09/09, 66 y.o.   MRN: [id number]    Subjective: Pt feels ok, but still with some RUQ pain  Objective: Vital signs in last 24 hours: Temp:  [97.6 F (36.4 C)-99.2 F (37.3 C)] 97.6 F (36.4 C) (10/01 0605) Pulse Rate:  [54-70] 70  (10/01 0605) Resp:  [10-20] 18  (10/01 0605) BP: (95-133)/(47-75) 133/74 mmHg (10/01 0605) SpO2:  [96 %-99 %] 96 % (10/01 0605) Weight:  [216 lb (97.977 kg)] 216 lb (97.977 kg) (10/01 0605) Last BM Date: 03/09/12  Intake/Output from previous day: 09/30 0701 - 10/01 0700 In: 44 [I.V.:44] Out: 2 [Urine:2] Intake/Output this shift:    PE: Abd: soft, tender in RUQ, +BS, ND  Lab Results:   [facility] 03/10/12 0520 03/09/12 1844  WBC 8.3 9.1  HGB 14.4 14.8  HCT 41.0 41.9  PLT 195 210   BMET  Basename 03/10/12 0520 03/09/12 1844 03/09/12 1021  NA 138 -- 135  K 3.7 -- 4.1  CL 100 -- 100  CO2 29 -- 26  GLUCOSE 128* -- 240*  BUN 11 -- 14  CREATININE 0.92 0.77 --  CALCIUM 9.2 -- 9.5   PT/INR  Basename 03/09/12 1844 03/09/12 1021  LABPROT 13.2 12.9  INR 1.01 0.98   CMP     Component Value Date/Time   NA 138 03/10/2012 0520   K 3.7 03/10/2012 0520   CL 100 03/10/2012 0520   CO2 29 03/10/2012 0520   GLUCOSE 128* 03/10/2012 0520   BUN 11 03/10/2012 0520   CREATININE 0.92 03/10/2012 0520   CALCIUM 9.2 03/10/2012 0520   PROT 6.8 03/10/2012 0520   ALBUMIN 3.4* 03/10/2012 0520   AST 16 03/10/2012 0520   ALT 18 03/10/2012 0520   ALKPHOS 60 03/10/2012 0520   BILITOT 0.6 03/10/2012 0520   GFRNONAA 87* 03/10/2012 0520   GFRAA >90 03/10/2012 0520   Lipase     Component Value Date/Time   LIPASE 38 03/09/2012 1021       Studies/Results: US Abdomen Complete  03/09/2012  *RADIOLOGY REPORT*  Clinical Data:  Chest pain and right-sided abdominal pain.  ABDOMINAL ULTRASOUND COMPLETE  Comparison:  CT chest 08/12/2007  Findings:  Gallbladder:  There are multiple gallstones.  One of the largest gallstones visualized  measures 15 mm.  Gallbladder wall thickness is normal (1 mm).  The sonographic Murphy's sign is negative.  Common Bile Duct:  Within normal limits in caliber.  Measures 5 mm.  Liver: No focal mass lesion identified.  Within normal limits in parenchymal echogenicity.  IVC:  Appears normal were visualized.  Pancreas:  The pancreas is not visualized, as it is obscured by overlying bowel gas.  Spleen:  Within normal limits in size and echotexture. Measures 8.6 cm in sagittal length.  Right kidney:  Normal in size and parenchymal echogenicity.  No evidence of  hydronephrosis. There is an 11 x 13 x 16 mm lower pole simple cyst.  Left kidney:  Upper pole region is difficult to completely visualize, due to some adjacent shadowing bowel gas. Normal in size and parenchymal echogenicity.  No evidence of mass or hydronephrosis.  Abdominal Aorta:  No aneurysm identified.  IMPRESSION:  1.  Cholelithiasis. 2.  Normal gallbladder wall thickness and negative sonographic Murphy's sign. 3.  The pancreas and upper pole of the left kidney are difficult to visualize due to bowel gas.   Original Report Authenticated By: Britta Mccreedy, M.D.    Dg Chest Portable  1 View  03/09/2012  *RADIOLOGY REPORT*  Clinical Data: Chest pain  PORTABLE CHEST - 1 VIEW  Comparison: Chest CT 08/12/2007 chest radiograph 08/12/2007  Findings: Heart size appears borderline enlarged and stable. Thoracic aorta and hilar contours are stable.  Pulmonary vascularity is normal.  The lung volumes are low.  There is bilateral peribronchial thickening at the lung bases, which may be accentuated by low lung volumes.  No focal opacities, edema, effusion, or visible pneumothorax.  No acute bony abnormality.  IMPRESSION: Low lung volumes. Bilateral peribronchial thickening at the lung bases appears chronic, and is likely accentuated by low lung volumes. No definite acute abnormality.   Original Report Authenticated By: Britta Mccreedy, M.D.      Anti-infectives: Anti-infectives    None       Assessment/Plan  1. Cholelithiasis 2. Abdominal pain 3. H/o CAD  Plan: 1. For cardiolite stress test today, if negative will plan for lap chole tomorrow.   LOS: 1 day    Shey Yott E 03/10/2012, 8:05 AM Pager: [phone]

## 2012-03-10 NOTE — Progress Notes (Signed)
Subjective: Appreciate cardiology evaluation by Dr. Katrinka Blazing. Stress cardiolyte pending this morning  Patient is stable. Has mild right upper quadrant pain which has been typical for the past several weeks. No severe pain. No nausea. Is hungry.  CBC and liver function tests are normal this morning. Objective: Vital signs in last 24 hours: Temp:  [97.6 F (36.4 C)-99.2 F (37.3 C)] 97.6 F (36.4 C) (10/01 0605) Pulse Rate:  [54-70] 70  (10/01 0605) Resp:  [10-20] 18  (10/01 0605) BP: (95-133)/(47-75) 133/74 mmHg (10/01 0605) SpO2:  [96 %-99 %] 96 % (10/01 0605) Weight:  [216 lb (97.977 kg)] 216 lb (97.977 kg) (10/01 0605) Last BM Date: 03/09/12  Intake/Output from previous day: 09/30 0701 - 10/01 0700 In: 44 [I.V.:44] Out: 2 [Urine:2] Intake/Output this shift:    General appearance: alert. Mental status normal. No distress. Resp: clear to auscultation bilaterally GI: abdomen soft, nontender, nondistended, benign exam. Subjectively uncomfortable in right upper quadrant but objectively no guarding or mass.  Lab Results:   Basename 03/10/12 0520 03/09/12 1844  WBC 8.3 9.1  HGB 14.4 14.8  HCT 41.0 41.9  PLT 195 210   BMET  Basename 03/10/12 0520 03/09/12 1844 03/09/12 1021  NA 138 -- 135  K 3.7 -- 4.1  CL 100 -- 100  CO2 29 -- 26  GLUCOSE 128* -- 240*  BUN 11 -- 14  CREATININE 0.92 0.77 --  CALCIUM 9.2 -- 9.5   PT/INR  Basename 03/09/12 1844 03/09/12 1021  LABPROT 13.2 12.9  INR 1.01 0.98   ABG No results found for this basename: PHART:2,PCO2:2,PO2:2,HCO3:2 in the last 72 hours  Studies/Results: US Abdomen Complete  03/09/2012  *RADIOLOGY REPORT*  Clinical Data:  Chest pain and right-sided abdominal pain.  ABDOMINAL ULTRASOUND COMPLETE  Comparison:  CT chest 08/12/2007  Findings:  Gallbladder:  There are multiple gallstones.  One of the largest gallstones visualized measures 15 mm.  Gallbladder wall thickness is normal (1 mm).  The sonographic Murphy's sign is  negative.  Common Bile Duct:  Within normal limits in caliber.  Measures 5 mm.  Liver: No focal mass lesion identified.  Within normal limits in parenchymal echogenicity.  IVC:  Appears normal were visualized.  Pancreas:  The pancreas is not visualized, as it is obscured by overlying bowel gas.  Spleen:  Within normal limits in size and echotexture. Measures 8.6 cm in sagittal length.  Right kidney:  Normal in size and parenchymal echogenicity.  No evidence of  hydronephrosis. There is an 11 x 13 x 16 mm lower pole simple cyst.  Left kidney:  Upper pole region is difficult to completely visualize, due to some adjacent shadowing bowel gas. Normal in size and parenchymal echogenicity.  No evidence of mass or hydronephrosis.  Abdominal Aorta:  No aneurysm identified.  IMPRESSION:  1.  Cholelithiasis. 2.  Normal gallbladder wall thickness and negative sonographic Murphy's sign. 3.  The pancreas and upper pole of the left kidney are difficult to visualize due to bowel gas.   Original Report Authenticated By: Britta Mccreedy, M.D.    Dg Chest Portable 1 View  03/09/2012  *RADIOLOGY REPORT*  Clinical Data: Chest pain  PORTABLE CHEST - 1 VIEW  Comparison: Chest CT 08/12/2007 chest radiograph 08/12/2007  Findings: Heart size appears borderline enlarged and stable. Thoracic aorta and hilar contours are stable.  Pulmonary vascularity is normal.  The lung volumes are low.  There is bilateral peribronchial thickening at the lung bases, which may be accentuated by low lung  volumes.  No focal opacities, edema, effusion, or visible pneumothorax.  No acute bony abnormality.  IMPRESSION: Low lung volumes. Bilateral peribronchial thickening at the lung bases appears chronic, and is likely accentuated by low lung volumes. No definite acute abnormality.   Original Report Authenticated By: Britta Mccreedy, M.D.     Anti-infectives: Anti-infectives    None      Assessment/Plan:  Gallstones, suspect intermittent biliary colic from  chronic cholecystitis with cholelithiasis. The patient would like to proceed with definitive laparoscopic cholecystectomy during this admission.  History of coronary artery disease, rule out ischemia as source of pain. For Cardiolite stress test this morning  I have called Elnita Maxwell in nuclear medicine and cancelled the hepatobiliary scan so that we can proceed with Cardiolite today.  If cardiac evaluation reveals low risk, we'll consider proceeding with laparoscopic cholecystectomy tomorrow. This is the patient's preference as well.  HTN DM Penile prosthesis   LOS: 1 day    Telisa Ohlsen M. Derrell Lolling, M.D., [facility] General and Minimally invasive Surgery Breast and Colorectal Surgery Office:   [phone] Pager:   [phone]  03/10/2012

## 2012-03-10 NOTE — Progress Notes (Signed)
Subjective:  No chest pain, no SOB. Had colic pain last night RUQ.   Objective:  Vital Signs in the last 24 hours: Temp:  [97.6 F (36.4 C)-98.1 F (36.7 C)] 97.6 F (36.4 C) (10/01 0605) Pulse Rate:  [54-70] 70  (10/01 0605) Resp:  [10-19] 18  (10/01 0605) BP: (95-133)/(47-75) 133/74 mmHg (10/01 0605) SpO2:  [96 %-99 %] 96 % (10/01 0605) Weight:  [97.977 kg (216 lb)] 97.977 kg (216 lb) (10/01 0605)  Intake/Output from previous day: 09/30 0701 - 10/01 0700 In: 44 [I.V.:44] Out: 2 [Urine:2]   Physical Exam: General: Well developed, well nourished, in no acute distress. Head:  Normocephalic and atraumatic. Lungs: Clear to auscultation and percussion. Heart: Normal S1 and S2.  No murmur, rubs or gallops.  Abdomen: soft, non-tender, positive bowel sounds. Extremities: No clubbing or cyanosis. No edema. Neurologic: Alert and oriented x 3.    Lab Results:  Basename 03/10/12 0520 03/09/12 1844  WBC 8.3 9.1  HGB 14.4 14.8  PLT 195 210    Basename 03/10/12 0520 03/09/12 1844 03/09/12 1021  NA 138 -- 135  K 3.7 -- 4.1  CL 100 -- 100  CO2 29 -- 26  GLUCOSE 128* -- 240*  BUN 11 -- 14  CREATININE 0.92 0.77 --    Basename 03/10/12 0520 03/09/12 2207  TROPONINI <0.30 <0.30   Hepatic Function Panel  Basename 03/10/12 0520  PROT 6.8  ALBUMIN 3.4*  AST 16  ALT 18  ALKPHOS 60  BILITOT 0.6  BILIDIR --  IBILI --   No results found for this basename: CHOL in the last 72 hours No results found for this basename: PROTIME in the last 72 hours  Imaging: US Abdomen Complete  03/09/2012  *RADIOLOGY REPORT*  Clinical Data:  Chest pain and right-sided abdominal pain.  ABDOMINAL ULTRASOUND COMPLETE  Comparison:  CT chest 08/12/2007  Findings:  Gallbladder:  There are multiple gallstones.  One of the largest gallstones visualized measures 15 mm.  Gallbladder wall thickness is normal (1 mm).  The sonographic Murphy's sign is negative.  Common Bile Duct:  Within normal limits in  caliber.  Measures 5 mm.  Liver: No focal mass lesion identified.  Within normal limits in parenchymal echogenicity.  IVC:  Appears normal were visualized.  Pancreas:  The pancreas is not visualized, as it is obscured by overlying bowel gas.  Spleen:  Within normal limits in size and echotexture. Measures 8.6 cm in sagittal length.  Right kidney:  Normal in size and parenchymal echogenicity.  No evidence of  hydronephrosis. There is an 11 x 13 x 16 mm lower pole simple cyst.  Left kidney:  Upper pole region is difficult to completely visualize, due to some adjacent shadowing bowel gas. Normal in size and parenchymal echogenicity.  No evidence of mass or hydronephrosis.  Abdominal Aorta:  No aneurysm identified.  IMPRESSION:  1.  Cholelithiasis. 2.  Normal gallbladder wall thickness and negative sonographic Murphy's sign. 3.  The pancreas and upper pole of the left kidney are difficult to visualize due to bowel gas.   Original Report Authenticated By: Britta Mccreedy, M.D.    Dg Chest Portable 1 View  03/09/2012  *RADIOLOGY REPORT*  Clinical Data: Chest pain  PORTABLE CHEST - 1 VIEW  Comparison: Chest CT 08/12/2007 chest radiograph 08/12/2007  Findings: Heart size appears borderline enlarged and stable. Thoracic aorta and hilar contours are stable.  Pulmonary vascularity is normal.  The lung volumes are low.  There is bilateral peribronchial  thickening at the lung bases, which may be accentuated by low lung volumes.  No focal opacities, edema, effusion, or visible pneumothorax.  No acute bony abnormality.  IMPRESSION: Low lung volumes. Bilateral peribronchial thickening at the lung bases appears chronic, and is likely accentuated by low lung volumes. No definite acute abnormality.   Original Report Authenticated By: Britta Mccreedy, M.D.     EKG:  NSR, small likely non pathologic Q waves inf. Leads, no ST changes  Cardiac Studies:  NUC pending  Assessment/Plan:  Principal Problem:  *Abdominal pain Active  Problems:  MI, old  Coronary artery disease  Hypertension  Dyslipidemia  -PRE-OP - NUC images pending. If low risk, will proceed with surgery. -History if "MI" at age 66. Had heart cath here at [facility] in 1981 with Dr. Aleen Campi, no CAD, dx with vasospasm -Continue with calcium channel blocker.   Will follow.     Valory Wetherby 03/10/2012, 11:44 AM

## 2012-03-10 NOTE — Progress Notes (Signed)
[facility]                                                                                Patient Demographics  William Hunter, is a 66 y.o. male  ZHY:[id number]  EXB:[id number]  DOB - 10-24-1945  Admit date - 03/09/2012  Admitting Physician Hollice Espy, MD  Outpatient Primary MD for the patient is Gaye Alken, MD  LOS - 1   Chief Complaint  Patient presents with  . Chest Pain        Assessment & Plan    1. Right upper quadrant abdominal/right-sided chest pain - Pain more suggestive of gallbladder pathology, chest x-ray, EKG,  serial  troponins  unremarkable, history not classic of coronary angina,  HIDA scan to be done, cardiology following patient likely due for stress test per cardiology note, once cleared by cardiology patient possibly might have cholecystectomy .    2. History of coronary vasospastic disease and hypertension- continue diltiazem, try to avoid beta blockers.     3. History of dyslipidemia continue home dose statin.    Code Status: Full  Family Communication: Discussed with the patient and family  Disposition Plan: Home    Procedures right upper quadrant ultrasound, HIDA, possible nuclear stress test, possible cholecystectomy   Consults  cardiology, surgery   Time Spent in minutes   35   Antibiotics   Anti-infectives    None      Scheduled Meds:   . antiseptic oral rinse  15 mL Mouth Rinse q12n4p  . chlorhexidine  15 mL Mouth Rinse BID  . diltiazem  300 mg Oral Daily  . famotidine  40 mg Oral BID  . glimepiride  2 mg Oral QAC breakfast  . heparin  5,000 Units Subcutaneous Q8H  . insulin aspart  0-5 Units Subcutaneous QHS  . insulin aspart  0-9 Units Subcutaneous TID WC  . insulin aspart  3 Units Subcutaneous TID WC  . simvastatin  10 mg Oral q1800  . sodium chloride  3 mL Intravenous Q12H   Continuous Infusions:   . sodium chloride 1,000 mL (03/09/12 1043)   PRN Meds:.albuterol,  dextrose 5 % and 0.45 % NaCl with KCl 10 mEq/L, guaiFENesin-dextromethorphan, morphine injection, ondansetron (ZOFRAN) IV, ondansetron   DVT Prophylaxis   Heparin     Susa Raring K M.D on 03/10/2012 at 11:31 AM  Between 7am to 7pm - Pager - [phone]  After 7pm go to [email or web address] - password [email or web address]  And look for the night coverage person covering for me after hours  [facility] Office  [phone]    Subjective:   William Hunter today has, No headache, No chest pain, minimal right upper quadrant abdominal pain - No Nausea, No new weakness tingling or numbness, No Cough - SOB.   Objective:   Filed Vitals:   03/09/12 1500 03/09/12 1515 03/09/12 2150 03/10/12 0605  BP: 112/55 123/75 124/65 133/74  Pulse: 58 54 58 70  Temp:  98 F (36.7 C) 98.1 F (36.7 C) 97.6 F (36.4 C)  TempSrc:  Oral Oral Oral  Resp: 11 18 18 18   Weight:    97.977 kg (216 lb)  SpO2: 98% 99%  97% 96%    Wt Readings from Last 3 Encounters:  03/10/12 97.977 kg (216 lb)     Intake/Output Summary (Last 24 hours) at 03/10/12 1131 Last data filed at 03/10/12 0600  Gross per 24 hour  Intake     44 ml  Output      2 ml  Net     42 ml    Exam Awake Alert, Oriented X 3, No new F.N deficits, Normal affect [place].AT,PERRAL Supple Neck,No JVD, No cervical lymphadenopathy appriciated.  Symmetrical Chest wall movement, Good air movement bilaterally, CTAB RRR,No Gallops,Rubs or new Murmurs, No Parasternal Heave +ve B.Sounds, Abd Soft, Non tender, No organomegaly appriciated, No rebound - guarding or rigidity. No Cyanosis, Clubbing or edema, No new Rash or bruise     Data Review   Micro Results No results found for this or any previous visit (from the past 240 hour(s)).  Radiology Reports US Abdomen Complete  03/09/2012  *RADIOLOGY REPORT*  Clinical Data:  Chest pain and right-sided abdominal pain.  ABDOMINAL ULTRASOUND COMPLETE  Comparison:  CT chest 08/12/2007  Findings:  Gallbladder:   There are multiple gallstones.  One of the largest gallstones visualized measures 15 mm.  Gallbladder wall thickness is normal (1 mm).  The sonographic Murphy's sign is negative.  Common Bile Duct:  Within normal limits in caliber.  Measures 5 mm.  Liver: No focal mass lesion identified.  Within normal limits in parenchymal echogenicity.  IVC:  Appears normal were visualized.  Pancreas:  The pancreas is not visualized, as it is obscured by overlying bowel gas.  Spleen:  Within normal limits in size and echotexture. Measures 8.6 cm in sagittal length.  Right kidney:  Normal in size and parenchymal echogenicity.  No evidence of  hydronephrosis. There is an 11 x 13 x 16 mm lower pole simple cyst.  Left kidney:  Upper pole region is difficult to completely visualize, due to some adjacent shadowing bowel gas. Normal in size and parenchymal echogenicity.  No evidence of mass or hydronephrosis.  Abdominal Aorta:  No aneurysm identified.  IMPRESSION:  1.  Cholelithiasis. 2.  Normal gallbladder wall thickness and negative sonographic Murphy's sign. 3.  The pancreas and upper pole of the left kidney are difficult to visualize due to bowel gas.   Original Report Authenticated By: Britta Mccreedy, M.D.    Dg Chest Portable 1 View  03/09/2012  *RADIOLOGY REPORT*  Clinical Data: Chest pain  PORTABLE CHEST - 1 VIEW  Comparison: Chest CT 08/12/2007 chest radiograph 08/12/2007  Findings: Heart size appears borderline enlarged and stable. Thoracic aorta and hilar contours are stable.  Pulmonary vascularity is normal.  The lung volumes are low.  There is bilateral peribronchial thickening at the lung bases, which may be accentuated by low lung volumes.  No focal opacities, edema, effusion, or visible pneumothorax.  No acute bony abnormality.  IMPRESSION: Low lung volumes. Bilateral peribronchial thickening at the lung bases appears chronic, and is likely accentuated by low lung volumes. No definite acute abnormality.   Original Report  Authenticated By: Britta Mccreedy, M.D.     CBC  Lab 03/10/12 0520 03/09/12 1844 03/09/12 1021  WBC 8.3 9.1 8.0  HGB 14.4 14.8 14.6  HCT 41.0 41.9 42.2  PLT 195 210 209  MCV 86.9 86.0 86.3  MCH 30.5 30.4 29.9  MCHC 35.1 35.3 34.6  RDW 12.8 12.8 12.6  LYMPHSABS -- -- --  MONOABS -- -- --  EOSABS -- -- --  BASOSABS -- -- --  BANDABS -- -- --    Chemistries   Lab 03/10/12 0520 03/09/12 1844 03/09/12 1021  NA 138 -- 135  K 3.7 -- 4.1  CL 100 -- 100  CO2 29 -- 26  GLUCOSE 128* -- 240*  BUN 11 -- 14  CREATININE 0.92 0.77 0.78  CALCIUM 9.2 -- 9.5  MG 2.0 -- --  AST 16 -- 14  ALT 18 -- 18  ALKPHOS 60 -- 61  BILITOT 0.6 -- 0.3   ------------------------------------------------------------------------------------------------------------------ CrCl is unknown because there is no height on file for the current visit. ------------------------------------------------------------------------------------------------------------------  [facility] 03/09/12 1844  HGBA1C 8.7*   ------------------------------------------------------------------------------------------------------------------ No results found for this basename: CHOL:2,HDL:2,LDLCALC:2,TRIG:2,CHOLHDL:2,LDLDIRECT:2 in the last 72 hours ------------------------------------------------------------------------------------------------------------------ No results found for this basename: TSH,T4TOTAL,FREET3,T3FREE,THYROIDAB in the last 72 hours ------------------------------------------------------------------------------------------------------------------ No results found for this basename: VITAMINB12:2,FOLATE:2,FERRITIN:2,TIBC:2,IRON:2,RETICCTPCT:2 in the last 72 hours  Coagulation profile  Lab 03/09/12 1844 03/09/12 1021  INR 1.01 0.98  PROTIME -- --    No results found for this basename: DDIMER:2 in the last 72 hours  Cardiac Enzymes  Lab 03/10/12 0520 03/09/12 2207 03/09/12 1844  CKMB -- -- --  TROPONINI <0.30  <0.30 <0.30  MYOGLOBIN -- -- --   ------------------------------------------------------------------------------------------------------------------ No components found with this basename: POCBNP:3

## 2012-03-10 NOTE — Progress Notes (Signed)
General surgery attending note:  Nuclear medicine Cardiolite study looks good. No fixed or reversible defects to suggest ischemia, no wall motion abnormality, normal ejection fraction.  The patient is feeling pretty good and would like to go ahead with gallbladder surgery as soon as feasible.  Plan: We will tentatively schedule him for laparoscopic cholecystectomy with cholangiogram for tomorrow. Await cardiac clearance  I have discussed the indications, details, technique, and numerous risks of gallbladder surgery with him and his wife. They understand these issues. Their questions were answered. They agree with this plan.    Kienna Moncada M. Kam Kushnir, M.D., FACS [facility] General and Minimally invasive Surgery Breast and Colorectal Surgery Office:   [phone] Pager:   [phone]  

## 2012-03-10 NOTE — Progress Notes (Signed)
I agree with the assessment and plan outlined by [name]. See my note from 3:20 PM. Tentatively plan cholecystectomy tomorrow.  William Hunter

## 2012-03-11 ENCOUNTER — Inpatient Hospital Stay (HOSPITAL_COMMUNITY): Payer: BC Managed Care – PPO | Admitting: Anesthesiology

## 2012-03-11 ENCOUNTER — Encounter (HOSPITAL_COMMUNITY)
Admission: EM | Disposition: A | Discharge status: Home / Self Care | Payer: Self-pay | Source: Home / Self Care | Attending: Internal Medicine

## 2012-03-11 ENCOUNTER — Ambulatory Visit (HOSPITAL_COMMUNITY): Payer: BC Managed Care – PPO

## 2012-03-11 ENCOUNTER — Encounter (HOSPITAL_COMMUNITY): Payer: Self-pay | Admitting: Anesthesiology

## 2012-03-11 HISTORY — PX: CHOLECYSTECTOMY: SHX55

## 2012-03-11 LAB — GLUCOSE, CAPILLARY
Glucose-Capillary: 129 mg/dL — ABNORMAL HIGH (ref 70–99)
Glucose-Capillary: 183 mg/dL — ABNORMAL HIGH (ref 70–99)
Glucose-Capillary: 203 mg/dL — ABNORMAL HIGH (ref 70–99)

## 2012-03-11 LAB — SURGICAL PCR SCREEN
MRSA, PCR: NEGATIVE
Staphylococcus aureus: NEGATIVE

## 2012-03-11 IMAGING — RF DG CHOLANGIOGRAM OPERATIVE
1 series · 5 of 5 positions shown · non-contrast
Comparison: Ultrasound [DATE]

CLINICAL DATA: Laparoscopic cholecystectomy.

INTRAOPERATIVE CHOLANGIOGRAM

[Series 1: run · 2 acquisitions, 5 frames shown]
[im 1/2]
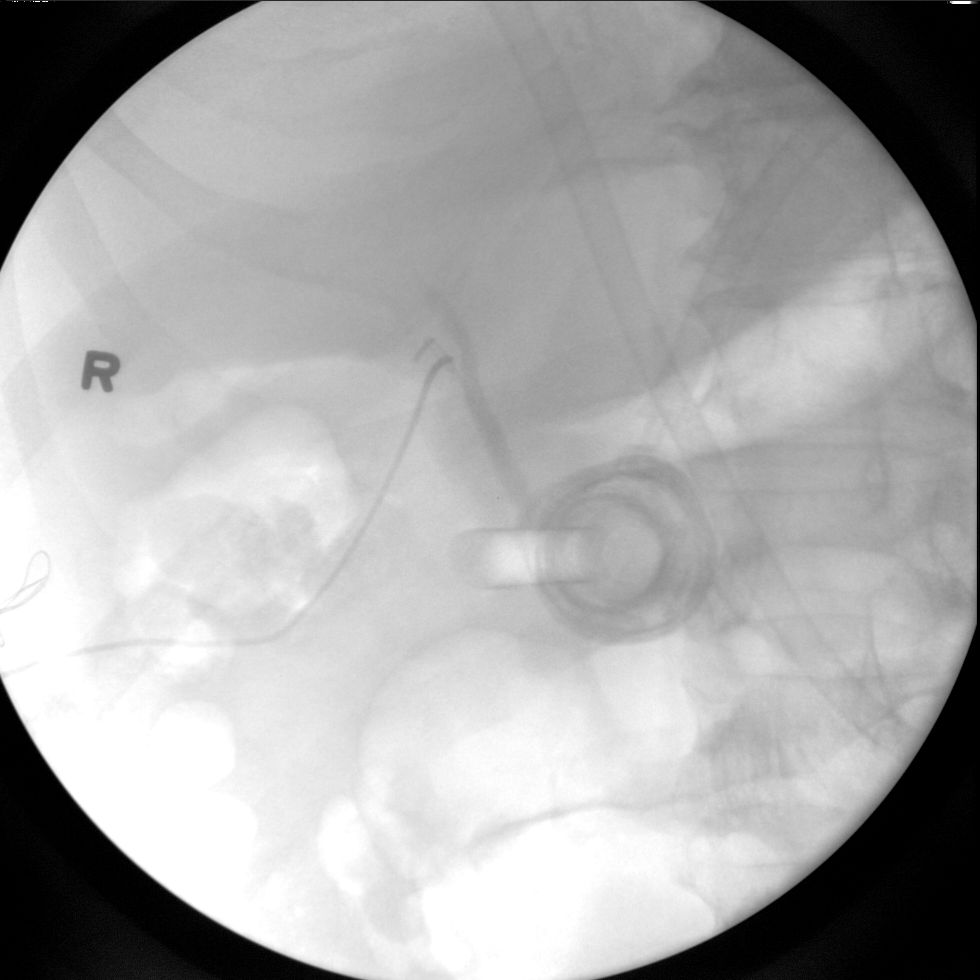
[im 1/2]
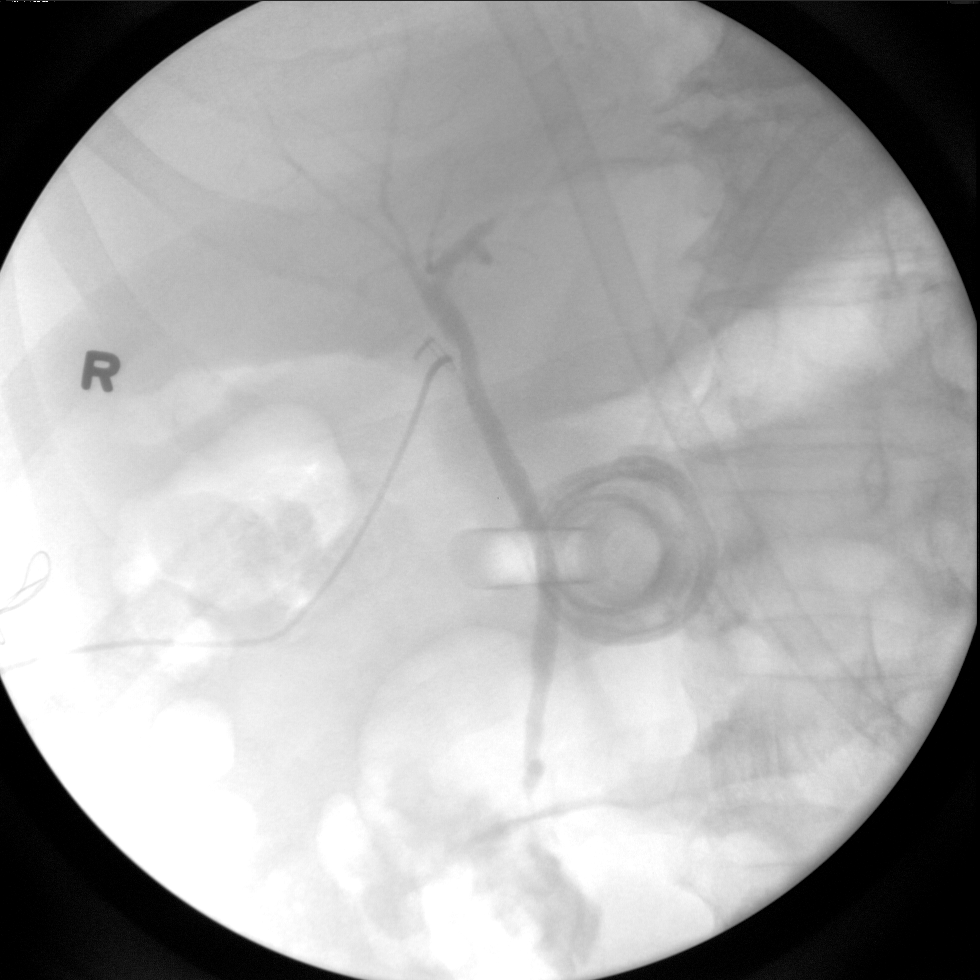
[im 1/2]
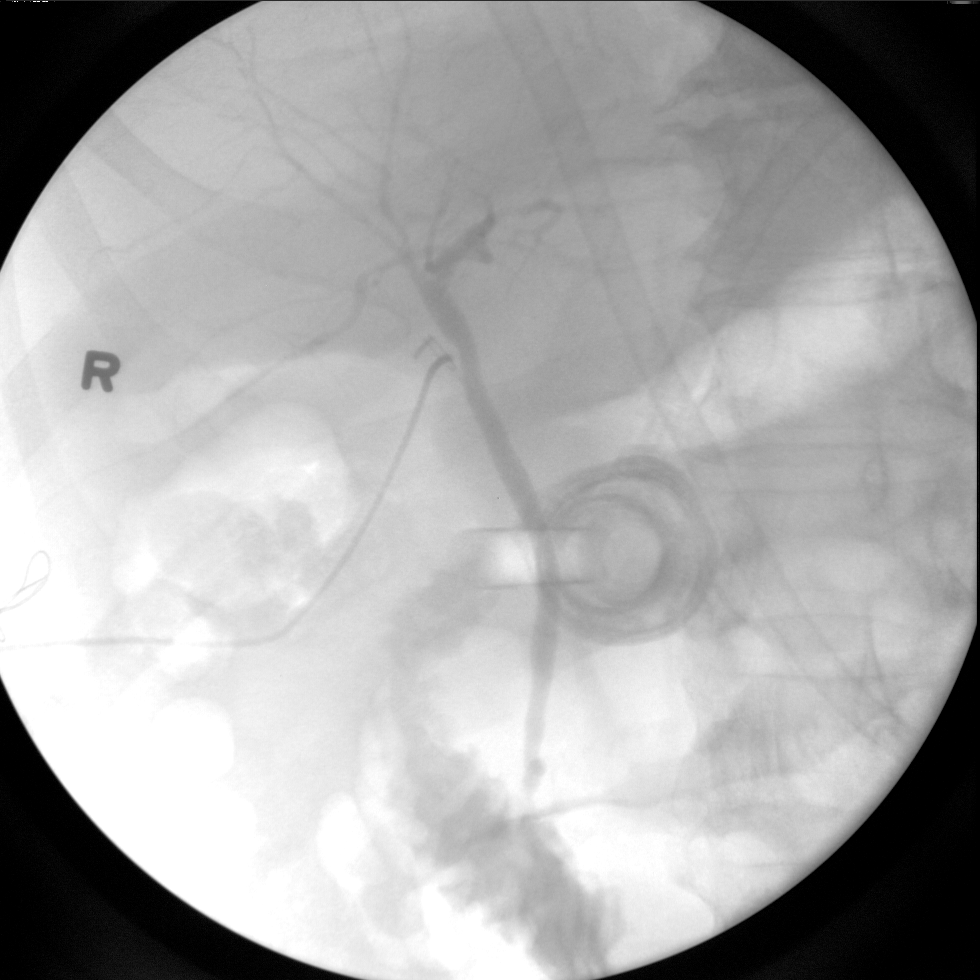
[im 1/2]
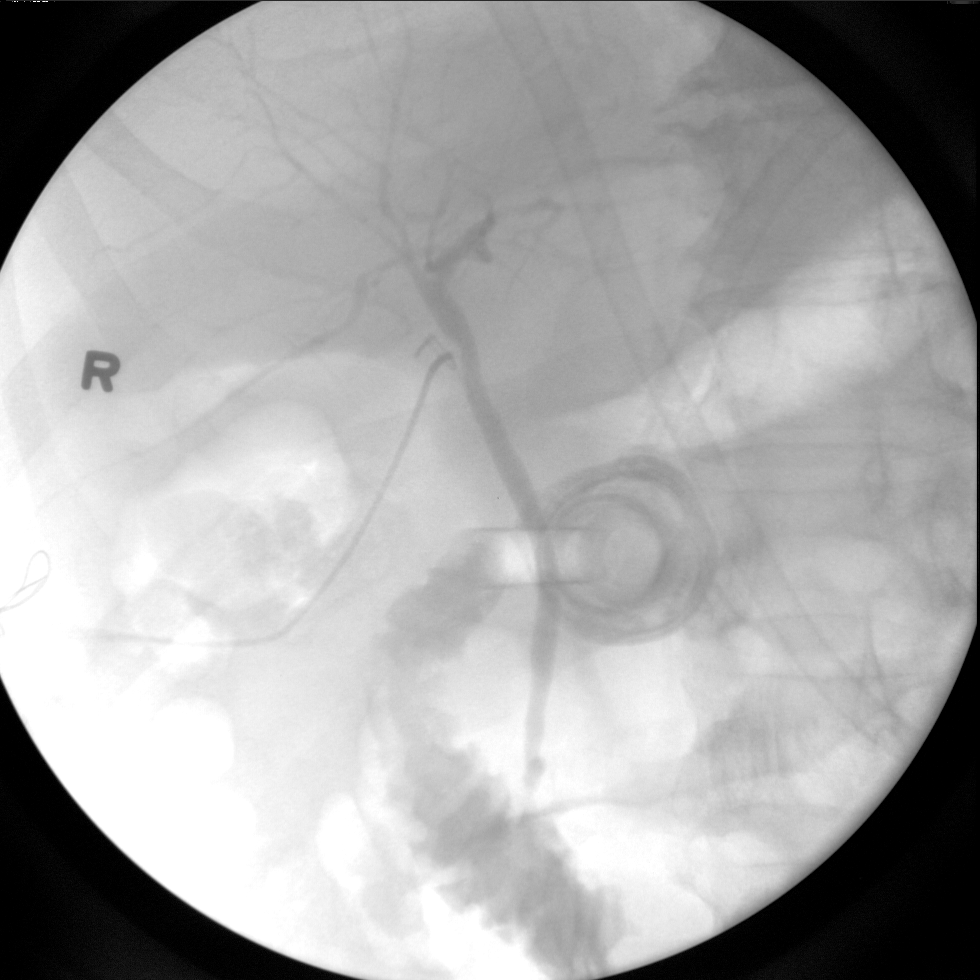
[im 2/2]
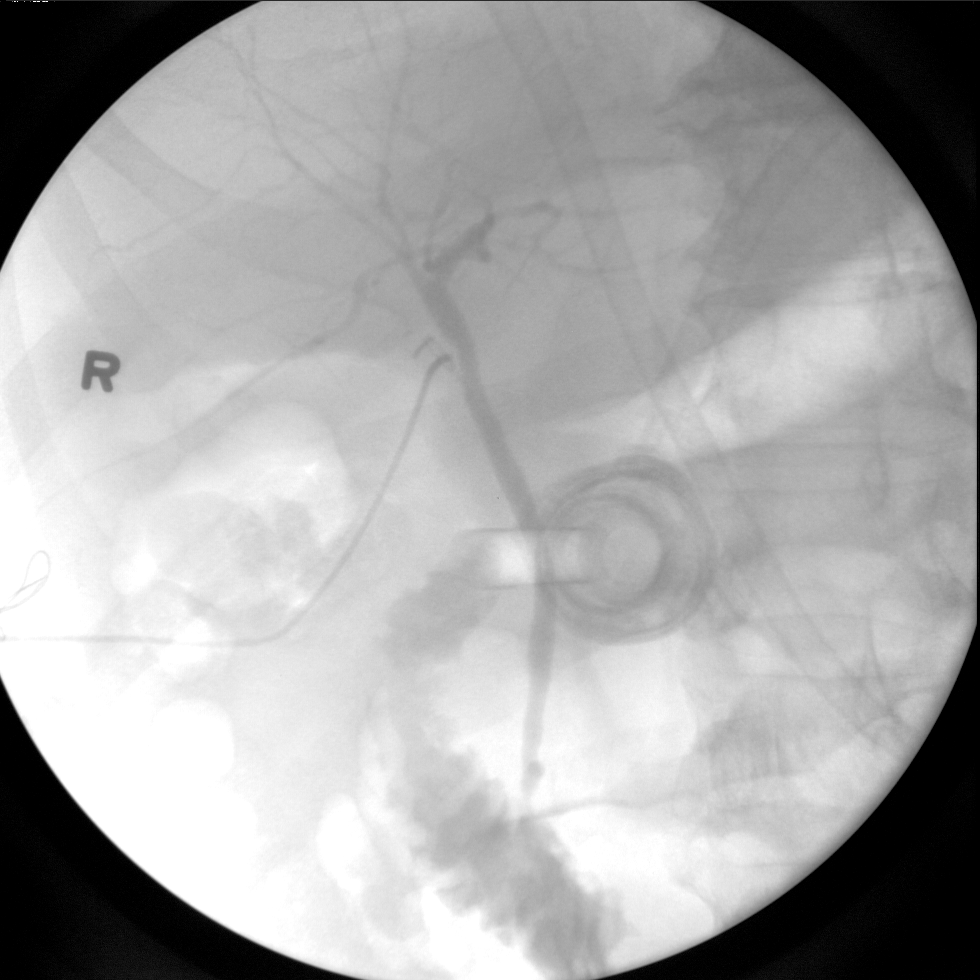

[5 of 5 positions shown; findings below may reference images not displayed]

FINDINGS: Intraoperative spot images show normal caliber biliary
system.  No evidence of retained stone or obstruction.  Free
passage of contrast into the small bowel noted.
IMPRESSION: No evidence of retained stone or obstruction.

These images were submitted for radiologic interpretation only.
Please see the procedural report for the amount of contrast and the
fluoroscopy time utilized.

## 2012-03-11 SURGERY — LAPAROSCOPIC CHOLECYSTECTOMY WITH INTRAOPERATIVE CHOLANGIOGRAM
Anesthesia: General | Site: Abdomen | Wound class: Clean Contaminated

## 2012-03-11 MED ORDER — ONDANSETRON HCL 4 MG PO TABS: 4.0000 mg | ORAL_TABLET | Freq: Four times a day (QID) | ORAL | Status: DC | PRN

## 2012-03-11 MED ORDER — HEPARIN SODIUM (PORCINE) 5000 UNIT/ML IJ SOLN
5000.0000 [IU] | Freq: Three times a day (TID) | INTRAMUSCULAR | Status: DC
  Administered 2012-03-12: 5000 [IU] via SUBCUTANEOUS
  Filled 2012-03-11 (×3): qty 1

## 2012-03-11 MED ORDER — ONDANSETRON HCL 4 MG/2ML IJ SOLN
INTRAMUSCULAR | Status: DC | PRN
  Administered 2012-03-11: 4 mg via INTRAVENOUS

## 2012-03-11 MED ORDER — POTASSIUM CHLORIDE IN NACL 20-0.9 MEQ/L-% IV SOLN
INTRAVENOUS | Status: DC
  Administered 2012-03-11: 05:00:00 via INTRAVENOUS
  Filled 2012-03-11 (×3): qty 1000

## 2012-03-11 MED ORDER — MIDAZOLAM HCL 5 MG/5ML IJ SOLN
INTRAMUSCULAR | Status: DC | PRN
  Administered 2012-03-11: 1 mg via INTRAVENOUS

## 2012-03-11 MED ORDER — DROPERIDOL 2.5 MG/ML IJ SOLN: 0.6250 mg | INTRAMUSCULAR | Status: DC | PRN

## 2012-03-11 MED ORDER — NEOSTIGMINE METHYLSULFATE 1 MG/ML IJ SOLN
INTRAMUSCULAR | Status: DC | PRN
  Administered 2012-03-11: 5 mg via INTRAVENOUS

## 2012-03-11 MED ORDER — POTASSIUM CHLORIDE IN NACL 20-0.9 MEQ/L-% IV SOLN
INTRAVENOUS | Status: AC
  Administered 2012-03-11: 16:00:00 via INTRAVENOUS
  Filled 2012-03-11: qty 1000

## 2012-03-11 MED ORDER — FENTANYL CITRATE 0.05 MG/ML IJ SOLN
INTRAMUSCULAR | Status: DC | PRN
  Administered 2012-03-11: 100 ug via INTRAVENOUS
  Administered 2012-03-11 (×3): 50 ug via INTRAVENOUS
  Administered 2012-03-11: 100 ug via INTRAVENOUS

## 2012-03-11 MED ORDER — LACTATED RINGERS IV SOLN
INTRAVENOUS | Status: DC | PRN
  Administered 2012-03-11 (×2): via INTRAVENOUS

## 2012-03-11 MED ORDER — POTASSIUM CHLORIDE IN NACL 20-0.9 MEQ/L-% IV SOLN
INTRAVENOUS | Status: DC
  Filled 2012-03-11 (×2): qty 1000

## 2012-03-11 MED ORDER — MORPHINE SULFATE 2 MG/ML IJ SOLN
2.0000 mg | INTRAMUSCULAR | Status: DC | PRN
  Administered 2012-03-11 (×3): 2 mg via INTRAVENOUS
  Filled 2012-03-11 (×3): qty 1

## 2012-03-11 MED ORDER — ACETAMINOPHEN 10 MG/ML IV SOLN
INTRAVENOUS | Status: DC | PRN
  Administered 2012-03-11: 1000 mg via INTRAVENOUS

## 2012-03-11 MED ORDER — 0.9 % SODIUM CHLORIDE (POUR BTL) OPTIME
TOPICAL | Status: DC | PRN
  Administered 2012-03-11: 1000 mL

## 2012-03-11 MED ORDER — HYDROMORPHONE HCL PF 1 MG/ML IJ SOLN
0.2500 mg | INTRAMUSCULAR | Status: DC | PRN
  Administered 2012-03-11 (×4): 0.5 mg via INTRAVENOUS

## 2012-03-11 MED ORDER — HEMOSTATIC AGENTS (NO CHARGE) OPTIME
TOPICAL | Status: DC | PRN
  Administered 2012-03-11: 1 via TOPICAL

## 2012-03-11 MED ORDER — BUPIVACAINE-EPINEPHRINE 0.25% -1:200000 IJ SOLN
INTRAMUSCULAR | Status: DC | PRN
  Administered 2012-03-11: 13 mL

## 2012-03-11 MED ORDER — BUPIVACAINE-EPINEPHRINE PF 0.25-1:200000 % IJ SOLN
INTRAMUSCULAR | Status: AC
  Filled 2012-03-11: qty 30

## 2012-03-11 MED ORDER — SODIUM CHLORIDE 0.9 % IR SOLN
Status: DC | PRN
  Administered 2012-03-11: 1

## 2012-03-11 MED ORDER — PROPOFOL 10 MG/ML IV BOLUS
INTRAVENOUS | Status: DC | PRN
  Administered 2012-03-11: 180 mg via INTRAVENOUS
  Administered 2012-03-11: 20 mg via INTRAVENOUS

## 2012-03-11 MED ORDER — ROCURONIUM BROMIDE 100 MG/10ML IV SOLN
INTRAVENOUS | Status: DC | PRN
  Administered 2012-03-11: 35 mg via INTRAVENOUS
  Administered 2012-03-11: 10 mg via INTRAVENOUS

## 2012-03-11 MED ORDER — HYDROMORPHONE HCL PF 1 MG/ML IJ SOLN
INTRAMUSCULAR | Status: AC
  Filled 2012-03-11: qty 1

## 2012-03-11 MED ORDER — HYDROMORPHONE HCL PF 1 MG/ML IJ SOLN: 0.2500 mg | INTRAMUSCULAR | Status: DC | PRN

## 2012-03-11 MED ORDER — DEXTROSE-NACL 5-0.2 % IV SOLN: INTRAVENOUS | Status: DC

## 2012-03-11 MED ORDER — CIPROFLOXACIN IN D5W 400 MG/200ML IV SOLN
400.0000 mg | Freq: Two times a day (BID) | INTRAVENOUS | Status: AC
  Administered 2012-03-11: 400 mg via INTRAVENOUS
  Filled 2012-03-11: qty 200

## 2012-03-11 MED ORDER — ACETAMINOPHEN 325 MG PO TABS: 650.0000 mg | ORAL_TABLET | ORAL | Status: DC | PRN

## 2012-03-11 MED ORDER — SODIUM CHLORIDE 0.9 % IV SOLN
INTRAVENOUS | Status: DC | PRN
  Administered 2012-03-11: 13:00:00

## 2012-03-11 MED ORDER — GLYCOPYRROLATE 0.2 MG/ML IJ SOLN
INTRAMUSCULAR | Status: DC | PRN
  Administered 2012-03-11: 0.6 mg via INTRAVENOUS

## 2012-03-11 MED ORDER — LACTATED RINGERS IV SOLN
INTRAVENOUS | Status: DC
  Administered 2012-03-11: 11:00:00 via INTRAVENOUS

## 2012-03-11 MED ORDER — ONDANSETRON HCL 4 MG/2ML IJ SOLN
4.0000 mg | Freq: Four times a day (QID) | INTRAMUSCULAR | Status: DC | PRN
  Filled 2012-03-11: qty 2

## 2012-03-11 MED ORDER — ACETAMINOPHEN 10 MG/ML IV SOLN
INTRAVENOUS | Status: AC
  Filled 2012-03-11: qty 100

## 2012-03-11 MED ORDER — HYDROCODONE-ACETAMINOPHEN 5-325 MG PO TABS: 1.0000 | ORAL_TABLET | ORAL | Status: DC | PRN

## 2012-03-11 SURGICAL SUPPLY — 48 items
ADH SKN CLS APL DERMABOND .7 (GAUZE/BANDAGES/DRESSINGS)
ADH SKN CLS LQ APL DERMABOND (GAUZE/BANDAGES/DRESSINGS) ×1
APPLIER CLIP ROT 10 11.4 M/L (STAPLE) ×2
APR CLP MED LRG 11.4X10 (STAPLE) ×1
BAG SPEC RTRVL LRG 6X4 10 (ENDOMECHANICALS) ×1
BLADE SURG ROTATE 9660 (MISCELLANEOUS) ×1 IMPLANT
CANISTER SUCTION 2500CC (MISCELLANEOUS) ×2 IMPLANT
CHLORAPREP W/TINT 26ML (MISCELLANEOUS) ×3 IMPLANT
CLIP APPLIE ROT 10 11.4 M/L (STAPLE) ×1 IMPLANT
CLOTH BEACON ORANGE TIMEOUT ST (SAFETY) ×2 IMPLANT
COVER MAYO STAND STRL (DRAPES) ×2 IMPLANT
COVER SURGICAL LIGHT HANDLE (MISCELLANEOUS) ×2 IMPLANT
DECANTER SPIKE VIAL GLASS SM (MISCELLANEOUS) ×4 IMPLANT
DERMABOND ADHESIVE PROPEN (GAUZE/BANDAGES/DRESSINGS) ×1
DERMABOND ADVANCED (GAUZE/BANDAGES/DRESSINGS)
DERMABOND ADVANCED .7 DNX12 (GAUZE/BANDAGES/DRESSINGS) ×1 IMPLANT
DERMABOND ADVANCED .7 DNX6 (GAUZE/BANDAGES/DRESSINGS) IMPLANT
DRAPE C-ARM 42X72 X-RAY (DRAPES) ×2 IMPLANT
DRAPE UTILITY 15X26 W/TAPE STR (DRAPE) ×4 IMPLANT
ELECT REM PT RETURN 9FT ADLT (ELECTROSURGICAL) ×2
ELECTRODE REM PT RTRN 9FT ADLT (ELECTROSURGICAL) ×1 IMPLANT
GLOVE BIO SURGEON STRL SZ7 (GLOVE) ×2 IMPLANT
GLOVE BIOGEL PI IND STRL 7.0 (GLOVE) IMPLANT
GLOVE BIOGEL PI IND STRL 7.5 (GLOVE) IMPLANT
GLOVE BIOGEL PI INDICATOR 7.0 (GLOVE) ×3
GLOVE BIOGEL PI INDICATOR 7.5 (GLOVE) ×2
GLOVE EUDERMIC 7 POWDERFREE (GLOVE) ×3 IMPLANT
GLOVE SURG SS PI 7.0 STRL IVOR (GLOVE) ×1 IMPLANT
GOWN PREVENTION PLUS XLARGE (GOWN DISPOSABLE) ×3 IMPLANT
GOWN STRL NON-REIN LRG LVL3 (GOWN DISPOSABLE) ×7 IMPLANT
HEMOSTAT SNOW SURGICEL 2X4 (HEMOSTASIS) ×1 IMPLANT
KIT BASIN OR (CUSTOM PROCEDURE TRAY) ×2 IMPLANT
KIT ROOM TURNOVER OR (KITS) ×2 IMPLANT
NS IRRIG 1000ML POUR BTL (IV SOLUTION) ×2 IMPLANT
PAD ARMBOARD 7.5X6 YLW CONV (MISCELLANEOUS) ×2 IMPLANT
POUCH SPECIMEN RETRIEVAL 10MM (ENDOMECHANICALS) ×2 IMPLANT
SCISSORS LAP 5X35 DISP (ENDOMECHANICALS) ×1 IMPLANT
SET CHOLANGIOGRAPH 5 50 .035 (SET/KITS/TRAYS/PACK) ×2 IMPLANT
SET IRRIG TUBING LAPAROSCOPIC (IRRIGATION / IRRIGATOR) ×2 IMPLANT
SLEEVE ENDOPATH XCEL 5M (ENDOMECHANICALS) ×2 IMPLANT
SPECIMEN JAR SMALL (MISCELLANEOUS) ×2 IMPLANT
SUT MNCRL AB 4-0 PS2 18 (SUTURE) ×3 IMPLANT
TOWEL OR 17X24 6PK STRL BLUE (TOWEL DISPOSABLE) ×2 IMPLANT
TOWEL OR 17X26 10 PK STRL BLUE (TOWEL DISPOSABLE) ×2 IMPLANT
TRAY LAPAROSCOPIC (CUSTOM PROCEDURE TRAY) ×2 IMPLANT
TROCAR XCEL BLUNT TIP 100MML (ENDOMECHANICALS) ×2 IMPLANT
TROCAR XCEL NON-BLD 11X100MML (ENDOMECHANICALS) ×2 IMPLANT
TROCAR XCEL NON-BLD 5MMX100MML (ENDOMECHANICALS) ×2 IMPLANT

## 2012-03-11 NOTE — Interval H&P Note (Signed)
History and Physical Interval Note:  03/11/2012 11:33 AM  William Hunter  has presented today for surgery, with the diagnosis of cholelithasis  The various methods of treatment have been discussed with the patient and family. After consideration of risks, benefits and other options for treatment, the patient has consented to  Procedure(s) (LRB) with comments: LAPAROSCOPIC CHOLECYSTECTOMY WITH INTRAOPERATIVE CHOLANGIOGRAM (N/A) as a surgical intervention .  The patient's history has been reviewed, patient examined, no change in status, stable for surgery.  I have reviewed the patient's chart and labs.  Questions were answered to the patient's satisfaction.     Ernestene Mention

## 2012-03-11 NOTE — Anesthesia Postprocedure Evaluation (Signed)
Anesthesia Post Note  Patient: William Hunter  Procedure(s) Performed: Procedure(s) (LRB): LAPAROSCOPIC CHOLECYSTECTOMY WITH INTRAOPERATIVE CHOLANGIOGRAM (N/A)  Anesthesia type: general  Patient location: PACU  Post pain: Pain level controlled  Post assessment: Patient's Cardiovascular Status Stable  Last Vitals:  Filed Vitals:   03/11/12 1315  BP: 135/52  Pulse: 55  Temp:   Resp: 18    Post vital signs: Reviewed and stable  Level of consciousness: sedated  Complications: No apparent anesthesia complications

## 2012-03-11 NOTE — Anesthesia Preprocedure Evaluation (Addendum)
Anesthesia Evaluation  Patient identified by MRN, date of birth, ID band Patient awake    Reviewed: Allergy & Precautions, H&P , NPO status , Patient's Chart, lab work & pertinent test results  History of Anesthesia Complications Negative for: history of anesthetic complications  Airway Mallampati: I TM Distance: >3 FB Neck ROM: Full    Dental  (+) Edentulous Upper and Dental Advisory Given   Pulmonary neg pulmonary ROS, pneumonia -, resolved,  breath sounds clear to auscultation  Pulmonary exam normal       Cardiovascular hypertension, Pt. on medications + angina with exertion + CAD Rhythm:Regular Rate:Normal     Neuro/Psych  Headaches, negative psych ROS   GI/Hepatic negative GI ROS, Neg liver ROS, GERD-  Medicated and Controlled,  Endo/Other  diabetes, Oral Hypoglycemic Agents  Renal/GU      Musculoskeletal   Abdominal   Peds  Hematology   Anesthesia Other Findings   Reproductive/Obstetrics                         Anesthesia Physical Anesthesia Plan  ASA: III  Anesthesia Plan: General   Post-op Pain Management:    Induction: Intravenous  Airway Management Planned: Oral ETT  Additional Equipment:   Intra-op Plan:   Post-operative Plan: Extubation in OR  Informed Consent: I have reviewed the patients History and Physical, chart, labs and discussed the procedure including the risks, benefits and alternatives for the proposed anesthesia with the patient or authorized representative who has indicated his/her understanding and acceptance.   Dental advisory given  Plan Discussed with: CRNA, Anesthesiologist and Surgeon  Anesthesia Plan Comments:         Anesthesia Quick Evaluation

## 2012-03-11 NOTE — Op Note (Signed)
Patient Name:           William Hunter   Date of Surgery:        03/11/2012  Pre op Diagnosis:      Chronic cholecystitis with cholelithiasis  Post op Diagnosis:    Same  Procedure:                 Laparoscopic cholecystectomy with cholangiogram  Surgeon:                     Angelia Mould. Derrell Lolling, M.D., FACS  Assistant:                      Dr. Corliss Skains and Dr. Jamey Ripa  Operative Indications:   This is a 66 year old Caucasian male with a history of coronary artery disease. He has a long history of intermittent attacks of biliary colic. He was admitted to the hospital 2 days ago with a severe attack of epigastric and right upper quadrant pain. He underwent cardiac evaluation yesterday and his Cardiolite looked good with no real evidence of ischemia, And he was cleared by cardiology. He wanted to go ahead with cholecystectomy at this time and we felt that was appropriate  Operative Findings:       The gallbladder was chronically inflamed. It was completely obscured by omental adhesions but there was no evidence of acute inflammation. The anatomy of the cystic duct,  cystic artery and common bile duct were conventional. The gallbladder was very densely adherent to the bed of the liver and the dissection was somewhat bloody but uneventful. The cholangiogram was normal, showing normal intrahepatic and extrahepatic biliary anatomy, no dilatation, no filling defect, and no obstruction  with good flow of contrast into the duodenum. The rest of the abdominal viscera were obscured by a very large fatty omentum.  Procedure in Detail:          Following the induction of general endotracheal anesthesia the patient's abdomen was prepped and draped in sterile fashion, intravenous antibiotics were given, and a surgical time out was performed. 0.5% Marcaine with epinephrine was used as local infiltration anesthetic. An 11 mm Hassan trocar was inserted in the infraumbilical position with an open technique and that was  uneventful. Pneumoperitoneum was created and the 11 mm trocar was placed in the subxiphoid region and 2two  mm trocars placed in the right upper quadrant. With traction and countertraction we slowly dissected the omentum off of the gallbladder. This took about 15 minutes and after that we had good visualization. We dissected out the cystic duct and cystic artery. A cholangiogram catheter was inserted into the cystic duct and a cholangiogram was obtained with normal findings as described above. After removing the cholangiocatheter we secured the cystic duct with multiple metal clips and divided it.   We isolated the cystic artery as it went onto the wall of the  gallbladder, secured it with metal clips and divided. We dissected a chronically inflamed gallbladder from the liver bed. This was a fairly raw surface dissection because of dense adherence of the gallbladder to the liver bed. Nevertheless we did remove the gallbladder, placed it in a specimen bag and removed it from the operative field. The liver bed was cauterized extensively. We irrigated extensively. There was a really no significant bleeding at the end of the case but because the bed of the liver was dry we placed a piece of SNOW  hemostatic sponge. We removed all irrigation  fluid. Everything looked fine. The trocars were removed under direct vision. There was no bleeding from the trocar sites. The pneumoperitoneum was released. The fascia at the umbilicus with 0 Vicryl sutures and the skin closed with subcuticular sutures of 4-0 Monocryl and Dermabond. The patient was taken to recovery in stable condition. EBL 20 cc. Counts correct. Complications none.     Angelia Mould. Derrell Lolling, M.D., FACS General and Minimally Invasive Surgery Breast and Colorectal Surgery  03/11/2012 1:04 PM

## 2012-03-11 NOTE — Transfer of Care (Signed)
Immediate Anesthesia Transfer of Care Note  Patient: William Hunter  Procedure(s) Performed: Procedure(s) (LRB) with comments: LAPAROSCOPIC CHOLECYSTECTOMY WITH INTRAOPERATIVE CHOLANGIOGRAM (N/A)  Patient Location: PACU  Anesthesia Type: General  Level of Consciousness: awake, alert  and oriented  Airway & Oxygen Therapy: Patient Spontanous Breathing and Patient connected to nasal cannula oxygen  Post-op Assessment: Report given to PACU RN and Post -op Vital signs reviewed and stable  Post vital signs: Reviewed  Complications: No apparent anesthesia complications

## 2012-03-11 NOTE — Progress Notes (Signed)
[facility]                                                                                Patient Demographics  William Hunter, is a 66 y.o. male  FAO:[id number]  ONG:[id number]  DOB - 13-Jan-1946  Admit date - 03/09/2012  Admitting Physician Hollice Espy, MD  Outpatient Primary MD for the patient is Gaye Alken, MD  LOS - 2   Chief Complaint  Patient presents with  . Chest Pain        Assessment & Plan    1. Right upper quadrant abdominal/right-sided chest pain - Pain more suggestive of gallbladder pathology, chest x-ray, EKG,  serial  troponins  unremarkable, history not classic of coronary angina,  his stress test is unremarkable, will continue him on Cardizem, patient due for cholecystectomy per general surgery later today.    2. History of coronary vasospastic disease and hypertension- continue diltiazem, try to avoid beta blockers.      3. History of dyslipidemia continue home dose statin.     4. Diabetes mellitus Will hold insulin sliding scale since patient is n.p.o. for surgery, A1c was elevated, will request primary care physician continue to monitor glycemic control closely as patient.    Lab Results  Component Value Date   HGBA1C 8.7* 03/09/2012    CBG (last 3)   Basename 03/11/12 0951 03/11/12 0809 03/10/12 2128  GLUCAP 129* 136* 107*      Code Status: Full  Family Communication: Discussed with the patient and family  Disposition Plan: Home    Procedures right upper quadrant ultrasound, HIDA, possible nuclear stress test, possible cholecystectomy   Consults  cardiology, surgery   Time Spent in minutes   35   Antibiotics   Anti-infectives     Start     Dose/Rate Route Frequency Ordered Stop   03/10/12 1524   ciprofloxacin (CIPRO) IVPB 400 mg        400 mg 200 mL/hr over 60 Minutes Intravenous 120 min pre-op 03/10/12 1524            Scheduled Meds:    . chlorhexidine  1 application  Topical Once  . ciprofloxacin  400 mg Intravenous 120 min pre-op  . diltiazem  300 mg Oral Daily  . famotidine  40 mg Oral BID  . glimepiride  2 mg Oral QAC breakfast  . heparin  5,000 Units Subcutaneous Q8H  . insulin aspart  0-5 Units Subcutaneous QHS  . insulin aspart  0-9 Units Subcutaneous TID WC  . insulin aspart  3 Units Subcutaneous TID WC  . simvastatin  10 mg Oral q1800  . sodium chloride  3 mL Intravenous Q12H  . DISCONTD: antiseptic oral rinse  15 mL Mouth Rinse q12n4p  . DISCONTD: chlorhexidine  15 mL Mouth Rinse BID   Continuous Infusions:    . dextrose 5 % and 0.2 % NaCl    . DISCONTD: sodium chloride 1,000 mL (03/09/12 1043)  . DISCONTD: sodium chloride 1,000 mL (03/11/12 0135)  . DISCONTD: 0.9 % NaCl with KCl 20 mEq / L 125 mL/hr at 03/11/12 0437  . DISCONTD: lactated ringers     PRN Meds:.albuterol,  fentaNYL, guaiFENesin-dextromethorphan, midazolam, morphine injection, ondansetron (ZOFRAN) IV, ondansetron, technetium sestamibi generic   DVT Prophylaxis   Heparin     Susa Raring K M.D on 03/11/2012 at 10:11 AM  Between 7am to 7pm - Pager - [phone]  After 7pm go to [email or web address] - password [email or web address]  And look for the night coverage person covering for me after hours  [facility] Office  [phone]    Subjective:   William Hunter today has, No headache, No chest pain, minimal right upper quadrant abdominal pain - No Nausea, No new weakness tingling or numbness, No Cough - SOB.   Objective:   Filed Vitals:   03/10/12 1351 03/10/12 1700 03/10/12 2100 03/11/12 0527  BP: 126/76  128/47 118/52  Pulse: 66  57 58  Temp: 97.8 F (36.6 C)  98.5 F (36.9 C) 98 F (36.7 C)  TempSrc:   Oral Oral  Resp: 20  18 18   Height:  6\' 1"  (1.854 m)    Weight:  94.348 kg (208 lb)  95.12 kg (209 lb 11.2 oz)  SpO2: 97%  100% 99%    Wt Readings from Last 3 Encounters:  03/11/12 95.12 kg (209 lb 11.2 oz)  03/11/12 95.12 kg (209 lb 11.2 oz)      Intake/Output Summary (Last 24 hours) at 03/11/12 1011 Last data filed at 03/11/12 0534  Gross per 24 hour  Intake 2077.04 ml  Output      7 ml  Net 2070.04 ml    Exam Awake Alert, Oriented X 3, No new F.N deficits, Normal affect [place].AT,PERRAL Supple Neck,No JVD, No cervical lymphadenopathy appriciated.  Symmetrical Chest wall movement, Good air movement bilaterally, CTAB RRR,No Gallops,Rubs or new Murmurs, No Parasternal Heave +ve B.Sounds, Abd Soft, Non tender, No organomegaly appriciated, No rebound - guarding or rigidity. No Cyanosis, Clubbing or edema, No new Rash or bruise     Data Review   Micro Results No results found for this or any previous visit (from the past 240 hour(s)).  Radiology Reports US Abdomen Complete  03/09/2012  *RADIOLOGY REPORT*  Clinical Data:  Chest pain and right-sided abdominal pain.  ABDOMINAL ULTRASOUND COMPLETE  Comparison:  CT chest 08/12/2007  Findings:  Gallbladder:  There are multiple gallstones.  One of the largest gallstones visualized measures 15 mm.  Gallbladder wall thickness is normal (1 mm).  The sonographic Murphy's sign is negative.  Common Bile Duct:  Within normal limits in caliber.  Measures 5 mm.  Liver: No focal mass lesion identified.  Within normal limits in parenchymal echogenicity.  IVC:  Appears normal were visualized.  Pancreas:  The pancreas is not visualized, as it is obscured by overlying bowel gas.  Spleen:  Within normal limits in size and echotexture. Measures 8.6 cm in sagittal length.  Right kidney:  Normal in size and parenchymal echogenicity.  No evidence of  hydronephrosis. There is an 11 x 13 x 16 mm lower pole simple cyst.  Left kidney:  Upper pole region is difficult to completely visualize, due to some adjacent shadowing bowel gas. Normal in size and parenchymal echogenicity.  No evidence of mass or hydronephrosis.  Abdominal Aorta:  No aneurysm identified.  IMPRESSION:  1.  Cholelithiasis. 2.  Normal gallbladder  wall thickness and negative sonographic Murphy's sign. 3.  The pancreas and upper pole of the left kidney are difficult to visualize due to bowel gas.   Original Report Authenticated By: Britta Mccreedy, M.D.    Dg Chest Portable 1 View  03/09/2012  *  RADIOLOGY REPORT*  Clinical Data: Chest pain  PORTABLE CHEST - 1 VIEW  Comparison: Chest CT 08/12/2007 chest radiograph 08/12/2007  Findings: Heart size appears borderline enlarged and stable. Thoracic aorta and hilar contours are stable.  Pulmonary vascularity is normal.  The lung volumes are low.  There is bilateral peribronchial thickening at the lung bases, which may be accentuated by low lung volumes.  No focal opacities, edema, effusion, or visible pneumothorax.  No acute bony abnormality.  IMPRESSION: Low lung volumes. Bilateral peribronchial thickening at the lung bases appears chronic, and is likely accentuated by low lung volumes. No definite acute abnormality.   Original Report Authenticated By: Britta Mccreedy, M.D.     CBC  Lab 03/10/12 1714 03/10/12 0520 03/09/12 1844 03/09/12 1021  WBC 7.5 8.3 9.1 8.0  HGB 14.8 14.4 14.8 14.6  HCT 42.0 41.0 41.9 42.2  PLT 225 195 210 209  MCV 85.9 86.9 86.0 86.3  MCH 30.3 30.5 30.4 29.9  MCHC 35.2 35.1 35.3 34.6  RDW 12.6 12.8 12.8 12.6  LYMPHSABS -- -- -- --  MONOABS -- -- -- --  EOSABS -- -- -- --  BASOSABS -- -- -- --  BANDABS -- -- -- --    Chemistries   Lab 03/10/12 1714 03/10/12 0520 03/09/12 1844 03/09/12 1021  NA 136 138 -- 135  K 3.5 3.7 -- 4.1  CL 99 100 -- 100  CO2 27 29 -- 26  GLUCOSE 90 128* -- 240*  BUN 9 11 -- 14  CREATININE 0.82 0.92 0.77 0.78  CALCIUM 9.3 9.2 -- 9.5  MG -- 2.0 -- --  AST 15 16 -- 14  ALT 18 18 -- 18  ALKPHOS 66 60 -- 61  BILITOT 0.4 0.6 -- 0.3   ------------------------------------------------------------------------------------------------------------------ estimated creatinine clearance is 102.9 ml/min (by C-G formula based on Cr of  0.82). ------------------------------------------------------------------------------------------------------------------  [facility] 03/09/12 1844  HGBA1C 8.7*   ------------------------------------------------------------------------------------------------------------------ No results found for this basename: CHOL:2,HDL:2,LDLCALC:2,TRIG:2,CHOLHDL:2,LDLDIRECT:2 in the last 72 hours ------------------------------------------------------------------------------------------------------------------ No results found for this basename: TSH,T4TOTAL,FREET3,T3FREE,THYROIDAB in the last 72 hours ------------------------------------------------------------------------------------------------------------------ No results found for this basename: VITAMINB12:2,FOLATE:2,FERRITIN:2,TIBC:2,IRON:2,RETICCTPCT:2 in the last 72 hours  Coagulation profile  Lab 03/09/12 1844 03/09/12 1021  INR 1.01 0.98  PROTIME -- --    No results found for this basename: DDIMER:2 in the last 72 hours  Cardiac Enzymes  Lab 03/10/12 0520 03/09/12 2207 03/09/12 1844  CKMB -- -- --  TROPONINI <0.30 <0.30 <0.30  MYOGLOBIN -- -- --   ------------------------------------------------------------------------------------------------------------------ No components found with this basename: POCBNP:3

## 2012-03-11 NOTE — Preoperative (Signed)
Beta Blockers   Reason not to administer Beta Blockers:Not Applicable 

## 2012-03-11 NOTE — H&P (View-Only) (Signed)
General surgery attending note:  Nuclear medicine Cardiolite study looks good. No fixed or reversible defects to suggest ischemia, no wall motion abnormality, normal ejection fraction.  The patient is feeling pretty good and would like to go ahead with gallbladder surgery as soon as feasible.  Plan: We will tentatively schedule him for laparoscopic cholecystectomy with cholangiogram for tomorrow. Await cardiac clearance  I have discussed the indications, details, technique, and numerous risks of gallbladder surgery with him and his wife. They understand these issues. Their questions were answered. They agree with this plan.    Angelia Mould. Derrell Lolling, M.D., [facility] General and Minimally invasive Surgery Breast and Colorectal Surgery Office:   [phone] Pager:   [phone]

## 2012-03-11 NOTE — Progress Notes (Signed)
Subjective:  Felt well over night. No CP, no SOB, no ABD pain  Objective:  Vital Signs in the last 24 hours: Temp:  [97.8 F (36.6 C)-98.5 F (36.9 C)] 98 F (36.7 C) (10/02 0527) Pulse Rate:  [57-66] 58  (10/02 0527) Resp:  [18-20] 18  (10/02 0527) BP: (118-184)/(47-83) 118/52 mmHg (10/02 0527) SpO2:  [97 %-100 %] 99 % (10/02 0527) Weight:  [94.348 kg (208 lb)-95.12 kg (209 lb 11.2 oz)] 95.12 kg (209 lb 11.2 oz) (10/02 0527)  Intake/Output from previous day: 10/01 0701 - 10/02 0700 In: 2077 [P.O.:600; I.V.:1477] Out: 8 [Urine:8]   Physical Exam: General: Well developed, well nourished, in no acute distress. Head:  Normocephalic and atraumatic. Lungs: Clear to auscultation and percussion. Heart: Normal S1 and S2.  No murmur, rubs or gallops.  Abdomen: soft, non-tender, positive bowel sounds. Extremities: No clubbing or cyanosis. No edema. Neurologic: Alert and oriented x 3.    Lab Results:  Basename 03/10/12 1714 03/10/12 0520  WBC 7.5 8.3  HGB 14.8 14.4  PLT 225 195    Basename 03/10/12 1714 03/10/12 0520  NA 136 138  K 3.5 3.7  CL 99 100  CO2 27 29  GLUCOSE 90 128*  BUN 9 11  CREATININE 0.82 0.92    Basename 03/10/12 0520 03/09/12 2207  TROPONINI <0.30 <0.30   Hepatic Function Panel  Basename 03/10/12 1714  PROT 7.3  ALBUMIN 3.6  AST 15  ALT 18  ALKPHOS 66  BILITOT 0.4  BILIDIR --  IBILI --       Cardiac Studies:  NUC - LOW RISK, no ischemia, normal EF  Assessment/Plan:  Principal Problem:  *Gallstones Active Problems:  Abdominal pain  MI, old  Coronary artery disease  Hypertension  Dyslipidemia   -Reassuring NUC stress - patient may proceed with surgery at Low overall cardiac risk.   -Trop neg x 3.   -Continue long standing Tiazac, diltiazem for "coronary vasospasm" dx.   SKAINS, MARK 03/11/2012, 8:46 AM

## 2012-03-12 ENCOUNTER — Encounter (HOSPITAL_COMMUNITY): Payer: Self-pay | Admitting: General Surgery

## 2012-03-12 DIAGNOSIS — K8020 Calculus of gallbladder without cholecystitis without obstruction: Secondary | ICD-10-CM

## 2012-03-12 DIAGNOSIS — I2510 Atherosclerotic heart disease of native coronary artery without angina pectoris: Secondary | ICD-10-CM

## 2012-03-12 DIAGNOSIS — R079 Chest pain, unspecified: Secondary | ICD-10-CM

## 2012-03-12 DIAGNOSIS — R109 Unspecified abdominal pain: Secondary | ICD-10-CM

## 2012-03-12 LAB — CBC
HCT: 44.2 % (ref 39.0–52.0)
Hemoglobin: 15.7 g/dL (ref 13.0–17.0)
RBC: 5.09 MIL/uL (ref 4.22–5.81)
WBC: 14.3 10*3/uL — ABNORMAL HIGH (ref 4.0–10.5)

## 2012-03-12 LAB — BASIC METABOLIC PANEL
BUN: 11 mg/dL (ref 6–23)
CO2: 28 mEq/L (ref 19–32)
Chloride: 99 mEq/L (ref 96–112)
GFR calc Af Amer: >90 mL/min (ref >90)
Glucose, Bld: 163 mg/dL — ABNORMAL HIGH (ref 70–99)
Potassium: 4.2 mEq/L (ref 3.5–5.1)

## 2012-03-12 LAB — GLUCOSE, CAPILLARY: Glucose-Capillary: 159 mg/dL — ABNORMAL HIGH (ref 70–99)

## 2012-03-12 MED ORDER — MORPHINE SULFATE (CONCENTRATE) 20 MG/ML PO SOLN: 10.0000 mg | ORAL | Status: DC | PRN

## 2012-03-12 MED ORDER — DOCUSATE SODIUM 100 MG PO CAPS: 100.0000 mg | ORAL_CAPSULE | Freq: Two times a day (BID) | ORAL | Status: DC | PRN

## 2012-03-12 NOTE — Discharge Summary (Signed)
[facility]                                                                                   William Hunter, is a 66 y.o. male  DOB 05-01-1946  MRN [id number].  Admission date:  03/09/2012  Discharge Date:  03/12/2012  Primary MD  Gaye Alken, MD  Admitting Physician  Hollice Espy, MD  Admission Diagnosis  Cholelithiasis [574.20] Chest pain [786.50] Cholelithiases   Discharge Diagnosis     Principal Problem:  *Gallstones Active Problems:  Abdominal pain  MI, old  Coronary artery disease  Hypertension  Dyslipidemia   Past Medical History  Diagnosis Date  . Hypertension   . Coronary artery disease   . MI, old     at age 49  . Dyslipidemia   . Cold feet     "from my diabetes; they stay cold" (03/09/2012)  . Anginal pain   . Type II diabetes mellitus   . Pneumonia ~ 1962; 1970's    "double"; "regular" (03/09/2012)  . Headache   . Migraines   . Arthritis     "fingers" (03/09/2012)    Past Surgical History  Procedure Date  . Penile prosthesis implant 1983  . Cardiac catheterization     x 3 with NO stents        Discharge Diagnoses:   Principal Problem:  *Gallstones Active Problems:  Abdominal pain  MI, old  Coronary artery disease  Hypertension  Dyslipidemia    Discharge Condition: Stable   Diet recommendation: See Discharge Instructions below   Consults Gen. surgery for laparoscopic cholecystectomy, cardiology for nuclear stress test   History of present illness and  Hospital Course:  See H&P, Labs, Consult and Test reports for all details in brief, patient was admitted for right upper quadrant abdominal pain due to chronic cholecystitis, patient was seen by general surgery and also by cardiology due to his questionable past heart history, he underwent a nuclear stress test which was unremarkable with no fixed or reversible ischemia, thereafter he underwent laparoscopic cholecystectomy and tolerated the  procedure well, he was seen by general surgeon Dr. Derrell Lolling this morning and cleared for discharge, will request primary care physician to please check CBC BMP to ensure they are stable, this morning he has mild reactionary leukocytosis however he's completely symptom-free. He denies any chest pain abdominal pain, passing flatus, no nausea vomiting. He is in good spirits and symptom-free and wants to go home.      Today   Subjective:   William Hunter today has no headache,no chest abdominal pain,no new weakness tingling or numbness, feels much better wants to go home today.   Objective:   Blood pressure 137/67, pulse 78, temperature 98 F (36.7 C), temperature source Oral, resp. rate 18, height 6\' 1"  (1.854 m), weight 95.12 kg (209 lb 11.2 oz), SpO2 97.00%.   Intake/Output Summary (Last 24 hours) at 03/12/12 1017 Last data filed at 03/12/12 0531  Gross per 24 hour  Intake   1800 ml  Output   1351 ml  Net    449 ml    Exam Awake Alert, Oriented *3, No new F.N deficits,  Normal affect William Hunter.AT,PERRAL Supple Neck,No JVD, No cervical lymphadenopathy appriciated.  Symmetrical Chest wall movement, Good air movement bilaterally, CTAB RRR,No Gallops,Rubs or new Murmurs, No Parasternal Heave +ve B.Sounds, Abd Soft, Non tender, No organomegaly appriciated, No rebound -guarding or rigidity. No Cyanosis, Clubbing or edema, No new Rash or bruise  Data Review   Major procedures and Radiology Reports - PLEASE review detailed and final reports for all details in brief -       Dg Cholangiogram Operative  03/11/2012  *RADIOLOGY REPORT*  Clinical Data:   Laparoscopic cholecystectomy.  INTRAOPERATIVE CHOLANGIOGRAM  Comparison: Ultrasound 03/09/2012  Findings: Intraoperative spot images show normal caliber biliary system.  No evidence of retained stone or obstruction.  Free passage of contrast into the small bowel noted.  IMPRESSION: No evidence of retained stone or obstruction.  These images were  submitted for radiologic interpretation only. Please see the procedural report for the amount of contrast and the fluoroscopy time utilized.   Original Report Authenticated By: Cyndie Chime, M.D.    US Abdomen Complete  03/09/2012  *RADIOLOGY REPORT*  Clinical Data:  Chest pain and right-sided abdominal pain.  ABDOMINAL ULTRASOUND COMPLETE  Comparison:  CT chest 08/12/2007  Findings:  Gallbladder:  There are multiple gallstones.  One of the largest gallstones visualized measures 15 mm.  Gallbladder wall thickness is normal (1 mm).  The sonographic Murphy's sign is negative.  Common Bile Duct:  Within normal limits in caliber.  Measures 5 mm.  Liver: No focal mass lesion identified.  Within normal limits in parenchymal echogenicity.  IVC:  Appears normal were visualized.  Pancreas:  The pancreas is not visualized, as it is obscured by overlying bowel gas.  Spleen:  Within normal limits in size and echotexture. Measures 8.6 cm in sagittal length.  Right kidney:  Normal in size and parenchymal echogenicity.  No evidence of  hydronephrosis. There is an 11 x 13 x 16 mm lower pole simple cyst.  Left kidney:  Upper pole region is difficult to completely visualize, due to some adjacent shadowing bowel gas. Normal in size and parenchymal echogenicity.  No evidence of mass or hydronephrosis.  Abdominal Aorta:  No aneurysm identified.  IMPRESSION:  1.  Cholelithiasis. 2.  Normal gallbladder wall thickness and negative sonographic Murphy's sign. 3.  The pancreas and upper pole of the left kidney are difficult to visualize due to bowel gas.   Original Report Authenticated By: Britta Mccreedy, M.D.    Nm Myocar Multi W/spect W/wall Motion / Ef  03/10/2012  *RADIOLOGY REPORT*  Clinical Data:  Chest pain.  Hypertension.  Diabetes.  MYOCARDIAL IMAGING WITH SPECT (REST AND EXERCISE) GATED LEFT VENTRICULAR WALL MOTION STUDY LEFT VENTRICULAR EJECTION FRACTION  Technique:  Standard myocardial SPECT imaging was performed after  resting intravenous injection of 10.0 mCi Tc-61m sestamibi. Subsequently, exercise tolerance test was performed by the patient under the supervision of the Cardiology staff.  At peak exercise, 30.0 mCi Tc-39m  sestamibi was injected intravenously and standard myocardial SPECT imaging was performed.  Quantitative gated imaging was also performed to evaluate left ventricular wall motion and estimate left ventricular ejection fraction.  Comparison:  Plain film of 03/09/2012.  Findings:  Rest images demonstrate mild apical thinning.  Normal left ventricular cavity size.  Stress images demonstrate no areas of reversibility to suggest inducible ischemia.  Evaluation of wall motion demonstrates normal left ventricular wall motion and thickening.  Ejection fraction is estimated at 54%.  End diastolic volume of 117 cc.  End  systolic volume of  53 cc.  IMPRESSION: 1.  No fixed or reversible defects to suggest inducible ischemia. 2.  Normal left ventricular wall motion and thickening. 3.  Normal ejection fraction.   Original Report Authenticated By: Consuello Bossier, M.D.    Dg Chest Portable 1 View  03/09/2012  *RADIOLOGY REPORT*  Clinical Data: Chest pain  PORTABLE CHEST - 1 VIEW  Comparison: Chest CT 08/12/2007 chest radiograph 08/12/2007  Findings: Heart size appears borderline enlarged and stable. Thoracic aorta and hilar contours are stable.  Pulmonary vascularity is normal.  The lung volumes are low.  There is bilateral peribronchial thickening at the lung bases, which may be accentuated by low lung volumes.  No focal opacities, edema, effusion, or visible pneumothorax.  No acute bony abnormality.  IMPRESSION: Low lung volumes. Bilateral peribronchial thickening at the lung bases appears chronic, and is likely accentuated by low lung volumes. No definite acute abnormality.   Original Report Authenticated By: Britta Mccreedy, M.D.     Micro Results      Recent Results (from the past 240 hour(s))  SURGICAL PCR  SCREEN     Status: Normal   Collection Time   03/11/12  5:45 AM      Component Value Range Status Comment   MRSA, PCR NEGATIVE  NEGATIVE Final    Staphylococcus aureus NEGATIVE  NEGATIVE Final      CBC w Diff: Lab Results  Component Value Date   WBC 14.3* 03/12/2012   HGB 15.7 03/12/2012   HCT 44.2 03/12/2012   PLT 187 03/12/2012   LYMPHOPCT 32 08/12/2007   MONOPCT 8 08/12/2007   EOSPCT 3 08/12/2007   BASOPCT 1 08/12/2007    CMP: Lab Results  Component Value Date   NA 137 03/12/2012   K 4.2 03/12/2012   CL 99 03/12/2012   CO2 28 03/12/2012   BUN 11 03/12/2012   CREATININE 0.83 03/12/2012   PROT 7.3 03/10/2012   ALBUMIN 3.6 03/10/2012   BILITOT 0.4 03/10/2012   ALKPHOS 66 03/10/2012   AST 15 03/10/2012   ALT 18 03/10/2012  .   Discharge Instructions     CCS ______[facility] LAPAROSCOPIC SURGERY: POST OP INSTRUCTIONS Always review your discharge instruction sheet given to you by the facility where your surgery was performed. IF YOU HAVE DISABILITY OR FAMILY LEAVE FORMS, YOU MUST BRING THEM TO THE OFFICE FOR PROCESSING.   DO NOT GIVE THEM TO YOUR DOCTOR.  A prescription for pain medication may be given to you upon discharge.  Take your pain medication as prescribed, if needed.  If narcotic pain medicine is not needed, then you may take acetaminophen (Tylenol) or ibuprofen (Advil) as needed. Take your usually prescribed medications unless otherwise directed. If you need a refill on your pain medication, please contact your pharmacy.  They will contact our office to request authorization. Prescriptions will not be filled after 5pm or on week-ends. You should follow a light diet the first few days after arrival home, such as soup and crackers, etc.  Be sure to include lots of fluids daily. Most patients will experience some swelling and bruising in the area of the incisions.  Ice packs will help.  Swelling and bruising can take several days to resolve.  It is common to  experience some constipation if taking pain medication after surgery.  Increasing fluid intake and taking a stool softener (such as Colace) will usually help or prevent this problem from occurring.  A mild laxative (  Milk of Magnesia or Miralax) should be taken according to package instructions if there are no bowel movements after 48 hours. Unless discharge instructions indicate otherwise, you may remove your bandages 24-48 hours after surgery, and you may shower at that time.  You may have steri-strips (small skin tapes) in place directly over the incision.  These strips should be left on the skin for 7-10 days.  If your surgeon used skin glue on the incision, you may shower in 24 hours.  The glue will flake off over the next 2-3 weeks.  Any sutures or staples will be removed at the office during your follow-up visit. ACTIVITIES:  You may resume regular (light) daily activities beginning the next day-such as daily self-care, walking, climbing stairs-gradually increasing activities as tolerated.  You may have sexual intercourse when it is comfortable.  Refrain from any heavy lifting or straining until approved by your doctor. You may drive when you are no longer taking prescription pain medication, you can comfortably wear a seatbelt, and you can safely maneuver your car and apply brakes. RETURN TO WORK:  __________________________________________________________ Bonita Quin should see your doctor in the office for a follow-up appointment approximately 2-3 weeks after your surgery.  Make sure that you call for this appointment within a day or two after you arrive home to insure a convenient appointment time. OTHER INSTRUCTIONS: __________________________________________________________________________________________________________________________ __________________________________________________________________________________________________________________________ WHEN TO CALL YOUR DOCTOR: Fever over  101.0 Inability to urinate Continued bleeding from incision. Increased pain, redness, or drainage from the incision. Increasing abdominal pain  The clinic staff is available to answer your questions during regular business hours.  Please don't hesitate to call and ask to speak to one of the nurses for clinical concerns.  If you have a medical emergency, go to the nearest emergency room or call 911.  A surgeon from [facility] is always on call at the hospital. [address], [address], [place], [place]  [address] ? [address] [address], [place], [place]   [address] [phone] ? [phone] ? FAX [phone] Web site: [email or web address]     Follow with Primary MD Gaye Alken, MD in 7 days   Get CBC, CMP, checked 7 days by Primary MD and again as instructed by your Primary MD. Get a 2 view Chest X ray done next visit.  Get Medicines reviewed and adjusted.  Please request your Prim.MD to go over all Hospital Tests and Procedure/Radiological results at the follow up, please get all Hospital records sent to your Prim MD by signing hospital release before you go home.  Activity: As tolerated with Full fall precautions use walker/cane & assistance as needed   Diet:  Heart Healthy  For Heart failure patients - Check your Weight same time everyday, if you gain over 2 pounds, or you develop in leg swelling, experience more shortness of breath or chest pain, call your Primary MD immediately. Follow Cardiac Low Salt Diet and 1.8 lit/day fluid restriction.  Disposition Home    If you experience worsening of your admission symptoms, develop shortness of breath, life threatening emergency, suicidal or homicidal thoughts you must seek medical attention immediately by calling 911 or calling your MD immediately  if symptoms less severe.  You Must read complete instructions/literature along with all the possible adverse reactions/side effects for all the  Medicines you take and that have been prescribed to you. Take any new Medicines after you have completely understood and accpet all the possible adverse reactions/side effects.   Do not drive  and provide baby sitting services if your were admitted for syncope or siezures until you have seen by Primary MD or a Neurologist and advised to do so again.  Do not drive when taking Pain medications.    Do not take more than prescribed Pain, Sleep and Anxiety Medications  Special Instructions: If you have smoked or chewed Tobacco  in the last 2 yrs please stop smoking, stop any regular Alcohol  and or any Recreational drug use.  Wear Seat belts while driving.   Follow-up Information    Follow up with Ernestene Mention, MD. Schedule an appointment as soon as possible for a visit in 3 weeks.   Contact information:   [address] [place] [address] [phone]       Follow up with Gaye Alken, MD. Schedule an appointment as soon as possible for a visit in 3 days.   Contact information:   [address] [place] [address] [phone]            Discharge Medications     Medication List     As of 03/12/2012 10:17 AM    START taking these medications         docusate sodium 100 MG capsule   Commonly known as: COLACE   Take 1 capsule (100 mg total) by mouth 2 (two) times daily as needed for constipation.      morphine 20 MG/ML concentrated solution   Commonly known as: ROXANOL   Take 0.5 mLs (10 mg total) by mouth every 4 (four) hours as needed for pain.      CONTINUE taking these medications         diltiazem 300 MG 24 hr capsule   Commonly known as: TIAZAC      glimepiride 4 MG tablet   Commonly known as: AMARYL      GLUCOSAMINE MSM COMPLEX PO      lovastatin 10 MG tablet   Commonly known as: MEVACOR      PEPCID PO          Where to get your medications    These are the prescriptions that you need to pick up. We sent them to a  specific pharmacy, so you will need to go there to get them.   [id number] - [place], [place] - [address]    [address] [place] [address]    Phone: [phone]        docusate sodium 100 MG capsule         You may get these medications from any pharmacy.         morphine 20 MG/ML concentrated solution               Total Time in preparing paper work, data evaluation and todays exam - 35 minutes  Leroy Sea M.D on 03/12/2012 at 10:17 AM  [facility] Office  [phone]

## 2012-03-12 NOTE — Progress Notes (Signed)
1 Day Post-Op  Subjective: Doing well postop. No nausea. Pain well-controlled. Angulatory. Voiding. Vital signs stable.  Objective: Vital signs in last 24 hours: Temp:  [97 F (36.1 C)-98.2 F (36.8 C)] 98 F (36.7 C) (10/03 0531) Pulse Rate:  [55-78] 78  (10/03 0531) Resp:  [11-25] 18  (10/03 0531) BP: (114-151)/(37-71) 137/67 mmHg (10/03 0531) SpO2:  [94 %-99 %] 97 % (10/03 0531) Last BM Date: 03/10/12  Intake/Output from previous day: 10/02 0701 - 10/03 0700 In: 1800 [P.O.:120; I.V.:1680] Out: 1351 [Urine:1350; Emesis/NG output:1] Intake/Output this shift:    General appearance: looks comfortable and alert. Mental status normal. No distress. GI: aabdomen soft. Wounds looked fine. Not distended. Benign postop exam.  Lab Results:  Results for orders placed during the hospital encounter of 03/09/12 (from the past 24 hour(s))  GLUCOSE, CAPILLARY     Status: Abnormal   Collection Time   03/11/12  8:09 AM      Component Value Range   Glucose-Capillary 136 (*) 70 - 99 mg/dL   Comment 1 Notify RN    GLUCOSE, CAPILLARY     Status: Abnormal   Collection Time   03/11/12  9:51 AM      Component Value Range   Glucose-Capillary 129 (*) 70 - 99 mg/dL   Comment 1 Notify RN    GLUCOSE, CAPILLARY     Status: Abnormal   Collection Time   03/11/12  2:30 PM      Component Value Range   Glucose-Capillary 183 (*) 70 - 99 mg/dL  GLUCOSE, CAPILLARY     Status: Abnormal   Collection Time   03/11/12  5:16 PM      Component Value Range   Glucose-Capillary 180 (*) 70 - 99 mg/dL   Comment 1 Notify RN    GLUCOSE, CAPILLARY     Status: Abnormal   Collection Time   03/11/12  9:33 PM      Component Value Range   Glucose-Capillary 203 (*) 70 - 99 mg/dL     Studies/Results: @RISRSLT24 @     . chlorhexidine  1 application Topical Once  . ciprofloxacin  400 mg Intravenous 120 min pre-op  . ciprofloxacin  400 mg Intravenous Q12H  . diltiazem  300 mg Oral Daily  . famotidine  40 mg Oral BID    . glimepiride  2 mg Oral QAC breakfast  . heparin  5,000 Units Subcutaneous Q8H  . HYDROmorphone      . HYDROmorphone      . insulin aspart  0-5 Units Subcutaneous QHS  . insulin aspart  0-9 Units Subcutaneous TID WC  . insulin aspart  3 Units Subcutaneous TID WC  . simvastatin  10 mg Oral q1800  . sodium chloride  3 mL Intravenous Q12H  . DISCONTD: heparin  5,000 Units Subcutaneous Q8H     Assessment/Plan: s/p Procedure(s): LAPAROSCOPIC CHOLECYSTECTOMY WITH INTRAOPERATIVE CHOLANGIOGRAM  POD #1. Laparoscopic cholecystectomy with cholangiogram and lysis of adhesions. Doing well. Advance diet. Assuming he tolerates breakfast, he may be discharged home today If he is cleared medically. Prescription for Vicodin entered into attic Diet and activities discussed Followup with me in my office in 3 weeks.  History of coronary vasospastic disease and hypertension Hyperlipidemia Non-insulin-dependent diabetes mellitus      LOS: 3 days    Martel Galvan M. Derrell Lolling, M.D., [facility] General and Minimally invasive Surgery Breast and Colorectal Surgery Office:   [phone] Pager:   [phone]  03/12/2012  . .prob

## 2012-04-02 ENCOUNTER — Ambulatory Visit (INDEPENDENT_AMBULATORY_CARE_PROVIDER_SITE_OTHER): Payer: BC Managed Care – PPO | Admitting: General Surgery

## 2012-04-02 ENCOUNTER — Encounter (INDEPENDENT_AMBULATORY_CARE_PROVIDER_SITE_OTHER): Payer: Self-pay | Admitting: General Surgery

## 2012-04-02 VITALS — BP 120/82 | HR 72 | Temp 97.6°F | Resp 14 | Ht 72.0 in | Wt 203.2 lb

## 2012-04-02 DIAGNOSIS — K8020 Calculus of gallbladder without cholecystitis without obstruction: Secondary | ICD-10-CM

## 2012-04-02 NOTE — Patient Instructions (Signed)
You  have recovered from your gallbladder surgery without any surgical complications.  Follow a low fat high fiber diet.  You may resume all normal physical activities without restriction.  Return to see Dr. Derrell Lolling if further problems arise.

## 2012-04-02 NOTE — Progress Notes (Signed)
Patient ID: William Hunter, male   DOB: [birth year], 66 y.o.   MRN: [id number] This patient has had biliary colic for some time. He was admitted to [facility] recently because of unremitting right upper quadrant pain. This settled down within a few hours. Because of his coronary disease he underwent cardiac evaluation and was cleared for surgery. On 03/11/2012 he underwent laparoscopic cholecystectomy with cholangiogram. We found severe chronic inflammation with excessive adhesions of the omentum to the gallbladder. The surgery was uneventful. The pathology showed chronic cholecystitis with cholelithiasis. He has now recovered from the surgery. He has no complaints about his abdomen or wounds. He is tolerating diet having well formed bowel movements.  On exam he looks well. In no distress. Abdomen is soft and nontender. All the trocar sites are well healed. No hernia or infection  Plan: Resume normal physical activities without restriction Low-fat high-fiber diet Return to see me if further problems arise.   Angelia Mould. Derrell Lolling, M.D., [facility] General and Minimally invasive Surgery Breast and Colorectal Surgery Office:   [phone] Pager:   [phone]

## 2012-10-30 ENCOUNTER — Other Ambulatory Visit: Payer: Self-pay | Admitting: Endocrinology

## 2012-10-30 DIAGNOSIS — E0590 Thyrotoxicosis, unspecified without thyrotoxic crisis or storm: Secondary | ICD-10-CM

## 2012-11-17 ENCOUNTER — Encounter (HOSPITAL_COMMUNITY)
Admission: RE | Admit: 2012-11-17 | Discharge: 2012-11-17 | Disposition: A | Discharge status: Home / Self Care | Payer: BC Managed Care – PPO | Source: Ambulatory Visit | Attending: Endocrinology | Admitting: Endocrinology

## 2012-11-17 DIAGNOSIS — E0590 Thyrotoxicosis, unspecified without thyrotoxic crisis or storm: Secondary | ICD-10-CM | POA: Insufficient documentation

## 2012-11-18 ENCOUNTER — Encounter (HOSPITAL_COMMUNITY)
Admission: RE | Admit: 2012-11-18 | Discharge: 2012-11-18 | Disposition: A | Discharge status: Home / Self Care | Payer: BC Managed Care – PPO | Source: Ambulatory Visit | Attending: Endocrinology | Admitting: Endocrinology

## 2012-11-18 MED ORDER — SODIUM IODIDE I 131 CAPSULE
12.0000 | Freq: Once | INTRAVENOUS | Status: AC | PRN
  Administered 2012-11-18: 12 via ORAL

## 2012-11-18 MED ORDER — SODIUM PERTECHNETATE TC 99M INJECTION
10.0000 | Freq: Once | INTRAVENOUS | Status: AC | PRN
  Administered 2012-11-18: 10 via INTRAVENOUS

## 2013-04-29 ENCOUNTER — Ambulatory Visit: Payer: BC Managed Care – PPO | Admitting: Endocrinology

## 2013-04-29 DIAGNOSIS — Z0289 Encounter for other administrative examinations: Secondary | ICD-10-CM

## 2013-04-30 ENCOUNTER — Encounter: Payer: Self-pay | Admitting: *Deleted

## 2013-07-19 ENCOUNTER — Encounter: Payer: Self-pay | Admitting: Cardiology

## 2013-07-19 ENCOUNTER — Ambulatory Visit (INDEPENDENT_AMBULATORY_CARE_PROVIDER_SITE_OTHER): Payer: BC Managed Care – PPO | Admitting: Cardiology

## 2013-07-19 VITALS — BP 124/78 | HR 120 | Ht 72.0 in | Wt 208.4 lb

## 2013-07-19 DIAGNOSIS — I10 Essential (primary) hypertension: Secondary | ICD-10-CM

## 2013-07-19 DIAGNOSIS — E785 Hyperlipidemia, unspecified: Secondary | ICD-10-CM

## 2013-07-19 DIAGNOSIS — I201 Angina pectoris with documented spasm: Secondary | ICD-10-CM

## 2013-07-19 DIAGNOSIS — I2510 Atherosclerotic heart disease of native coronary artery without angina pectoris: Secondary | ICD-10-CM

## 2013-07-19 DIAGNOSIS — I471 Supraventricular tachycardia: Secondary | ICD-10-CM

## 2013-07-19 MED ORDER — ATORVASTATIN CALCIUM 40 MG PO TABS: 40.0000 mg | ORAL_TABLET | Freq: Every day | ORAL | Status: DC

## 2013-07-19 NOTE — Patient Instructions (Addendum)
Your physician has recommended you make the following change in your medication:   1. Stop Lovastatin 2. Start Atorvastatin 40 mg once daily  Your physician recommends that you return for lab work in: 3 months , Lipid  Your physician wants you to follow-up in: 6 months with Dr. Anne Fu. You will receive a reminder letter in the mail two months in advance. If you don't receive a letter, please call our office to schedule the follow-up appointment.

## 2013-07-19 NOTE — Progress Notes (Signed)
[address], [address] [place], [place]  [address] Phone: [phone] Fax:  [phone]  Date:  07/19/2013   ID:  William, Hunter [birth year], MRN [id number]  PCP:  Gaye Alken, MD   History of Present Illness: William Hunter is a 68 y.o. male history of myocardial infarction at age 7 with heart catheterization in 1981 by Dr. Aleen Campi with no coronary artery disease, diagnosed with vasospasm who had cholelithiasis in 2013, diabetes, hyperlipidemia, hypertension here for followup. Diagnosed with RA. Had one bout of coronary vasospasm when changing blood pressure medications around. Also used to be on lovastatin but had some concerns with interaction with diltiazem by pharmacy.   Wt Readings from Last 3 Encounters:  07/19/13 208 lb 6.4 oz (94.53 kg)  04/02/12 203 lb 3.2 oz (92.171 kg)  03/11/12 209 lb 11.2 oz (95.12 kg)     Past Medical History  Diagnosis Date  . Hypertension   . Coronary artery disease   . MI, old     at age 47  . Dyslipidemia   . Cold feet     "from my diabetes; they stay cold" (03/09/2012)  . Anginal pain   . Type II diabetes mellitus   . Pneumonia ~ 1962; 1970's    "double"; "regular" (03/09/2012)  . Headache(784.0)   . Migraines   . Arthritis     "fingers" (03/09/2012)  . Thyroid disease     Dr Lucianne Muss  . Slipped cervical disc 2008    Dr Patsi Sears in the past/ Dr Larita Fife , chiropractor in 2008  . Hx of plastic surgery     plastic surgery following a fall off a loading dock ,lost  all his upper teeth   . Coronary artery spasm     since age 74 Dr [facility] practitioner  . Hx of gallstones     Dr Derrell Lolling lap cholecystectomy  . Rheumatoid arthritis     Dr Dierdre Forth     Past Surgical History  Procedure Laterality Date  . Penile prosthesis implant  1983  . Cardiac catheterization      x 3 with NO stents   . Cholecystectomy  03/11/2012    Procedure: LAPAROSCOPIC CHOLECYSTECTOMY WITH INTRAOPERATIVE CHOLANGIOGRAM;  Surgeon: Ernestene Mention,  MD;  Location: [facility] OR;  Service: General;  Laterality: N/A;    Current Outpatient Prescriptions  Medication Sig Dispense Refill  . diltiazem (TIAZAC) 300 MG 24 hr capsule Take 300 mg by mouth daily.      . Famotidine (PEPCID PO) Take 1 tablet by mouth daily.      Marland Kitchen glimepiride (AMARYL) 4 MG tablet Take 4 mg by mouth daily before breakfast.      . Glucos-MSM-C-Mn-Ginger-Willow (GLUCOSAMINE MSM COMPLEX PO) Take 2 tablets by mouth daily.      Marland Kitchen lovastatin (MEVACOR) 10 MG tablet Take 20 mg by mouth at bedtime.      . Simethicone (GAS-X PO) Take by mouth.       No current facility-administered medications for this visit.    Allergies:    Allergies  Allergen Reactions  . Codeine Swelling  . Penicillins Hives and Swelling  . Wool Alcohol [Lanolin] Hives    Social History:  The patient  reports that he has never smoked. He has never used smokeless tobacco. He reports that he does not drink alcohol or use illicit drugs.   ROS:  Please see the history of present illness.   Denies any fevers, chills, orthopnea, PND,  syncope  , no chest pain, no shortness of breath  PHYSICAL EXAM: VS:  BP 124/78  Pulse 120  Ht 6' (1.829 m)  Wt 208 lb 6.4 oz (94.53 kg)  BMI 28.26 kg/m2 Well nourished, well developed, in no acute distress HEENT: normal Neck: no JVD Cardiac:  normal S1, S2; RRR; no murmur Lungs:  clear to auscultation bilaterally, no wheezing, rhonchi or rales Abd: soft, nontender, no hepatomegaly Ext: no edema Skin: warm and dry Neuro: no focal abnormalities noted  EKG:  NSR. PAT.  Nuclear stress: 03/10/12-low risk, no ischemia, EF 53%     Labs: 2014-LDL 138. Triglycerides 328  ASSESSMENT AND PLAN:  1. Old myocardial infarction-in his early 30s, vasospasm was made as diagnosis. No significant CAD present. Nuclear stress test in 2013 reassuring, low risk. Normal EF. 2. Diabetes-per Dr. Zachery Dauer. Medications reviewed. 3. Hyperlipidemia-currently on low-dose lovastatin. It would be nice  for his LDL goal to be 70. Will change to atorvastatin. His pharmacist was concerned about interaction between lovastatin and diltiazem. 4. Hypertension-currently well controlled. No changes made to medications. 5. Paroxysmal atrial tachycardia-continue with diltiazem. 6. Coronary vasospasm-when he was changing his blood pressure medication, he states that he had some issues with vasospasm. I did clarify to him that there was no indication for a coronary stent in this situation.  Signed, Donato Schultz, MD [facility]  07/19/2013 8:29 AM

## 2013-07-30 ENCOUNTER — Other Ambulatory Visit: Payer: Self-pay

## 2013-07-30 MED ORDER — DILTIAZEM HCL ER BEADS 300 MG PO CP24: 300.0000 mg | ORAL_CAPSULE | Freq: Every day | ORAL | Status: DC

## 2013-09-16 ENCOUNTER — Other Ambulatory Visit (INDEPENDENT_AMBULATORY_CARE_PROVIDER_SITE_OTHER): Payer: BC Managed Care – PPO

## 2013-09-16 DIAGNOSIS — I10 Essential (primary) hypertension: Secondary | ICD-10-CM

## 2013-09-16 DIAGNOSIS — E785 Hyperlipidemia, unspecified: Secondary | ICD-10-CM

## 2013-09-16 DIAGNOSIS — I2510 Atherosclerotic heart disease of native coronary artery without angina pectoris: Secondary | ICD-10-CM

## 2013-09-16 LAB — LIPID PANEL
CHOL/HDL RATIO: 4
Cholesterol: 140 mg/dL (ref 0–200)
HDL: 36.5 mg/dL — ABNORMAL LOW (ref >39.00)
LDL CALC: 80 mg/dL (ref 0–99)
Triglycerides: 117 mg/dL (ref 0.0–149.0)
VLDL: 23.4 mg/dL (ref 0.0–40.0)

## 2014-03-03 ENCOUNTER — Other Ambulatory Visit: Payer: Self-pay

## 2014-03-03 MED ORDER — DILTIAZEM HCL ER BEADS 300 MG PO CP24: 300.0000 mg | ORAL_CAPSULE | Freq: Every day | ORAL | Status: DC

## 2014-04-06 ENCOUNTER — Encounter: Payer: Self-pay | Admitting: Cardiology

## 2014-04-06 ENCOUNTER — Ambulatory Visit (INDEPENDENT_AMBULATORY_CARE_PROVIDER_SITE_OTHER): Payer: Commercial Managed Care - HMO | Admitting: Cardiology

## 2014-04-06 VITALS — BP 146/98 | HR 90 | Ht 72.0 in | Wt 210.0 lb

## 2014-04-06 DIAGNOSIS — E785 Hyperlipidemia, unspecified: Secondary | ICD-10-CM

## 2014-04-06 DIAGNOSIS — I201 Angina pectoris with documented spasm: Secondary | ICD-10-CM | POA: Insufficient documentation

## 2014-04-06 DIAGNOSIS — I10 Essential (primary) hypertension: Secondary | ICD-10-CM

## 2014-04-06 DIAGNOSIS — I252 Old myocardial infarction: Secondary | ICD-10-CM

## 2014-04-06 NOTE — Progress Notes (Signed)
[address] [place], [place]  [address] Phone: [phone] Fax:  [phone]  Date:  04/06/2014   ID:  [name], [name] 08/09/1945, MRN [id number]  PCP:  No primary provider on file. Dr. Juleen China  History of Present Illness: William Hunter is a 68 y.o. male history of myocardial infarction at age 62 with heart catheterization in 1981 by Dr. Aleen Campi with no coronary artery disease, diagnosed with vasospasm who had cholelithiasis in 2013, diabetes, hyperlipidemia, hypertension here for followup. Diagnosed with RA. oon may be undergoing injections. Had one bout of coronary vasospasm when changing blood pressure medications around. Also used to be on lovastatin but had some concerns with interaction with diltiazem by pharmacy. He is currently on atorvastatin.  Currently doing well, no chest pain, no shortness of breath. No orthopnea.   Wt Readings from Last 3 Encounters:  04/06/14 210 lb (95.255 kg)  07/19/13 208 lb 6.4 oz (94.53 kg)  04/02/12 203 lb 3.2 oz (92.171 kg)     Past Medical History  Diagnosis Date  . Hypertension   . Coronary artery disease   . MI, old     at age 4  . Dyslipidemia   . Cold feet     "from my diabetes; they stay cold" (03/09/2012)  . Anginal pain   . Type II diabetes mellitus   . Pneumonia ~ 1962; 1970's    "double"; "regular" (03/09/2012)  . Headache(784.0)   . Migraines   . Arthritis     "fingers" (03/09/2012)  . Thyroid disease     Dr Lucianne Muss  . Slipped cervical disc 2008    Dr Patsi Sears in the past/ Dr Larita Fife , chiropractor in 2008  . Hx of plastic surgery     plastic surgery following a fall off a loading dock ,lost  all his upper teeth   . Coronary artery spasm     since age 51 Dr [facility] practitioner  . Hx of gallstones     Dr Derrell Lolling lap cholecystectomy  . Rheumatoid arthritis     Dr Dierdre Forth     Past Surgical History  Procedure Laterality Date  . Penile prosthesis implant  1983  . Cardiac catheterization      x 3 with NO  stents   . Cholecystectomy  03/11/2012    Procedure: LAPAROSCOPIC CHOLECYSTECTOMY WITH INTRAOPERATIVE CHOLANGIOGRAM;  Surgeon: Ernestene Mention, MD;  Location: [facility] OR;  Service: General;  Laterality: N/A;    Current Outpatient Prescriptions  Medication Sig Dispense Refill  . atorvastatin (LIPITOR) 40 MG tablet Take 1 tablet (40 mg total) by mouth daily.  30 tablet  3  . diltiazem (TIAZAC) 300 MG 24 hr capsule Take 1 capsule (300 mg total) by mouth daily.  30 capsule  6  . Famotidine (PEPCID PO) Take 1 tablet by mouth daily.      Marland Kitchen glimepiride (AMARYL) 4 MG tablet Take 4 mg by mouth daily before breakfast.      . JANUVIA 100 MG tablet       . leflunomide (ARAVA) 20 MG tablet       . metFORMIN (GLUCOPHAGE) 500 MG tablet Take 500 mg by mouth 3 (three) times daily.      . Simethicone (GAS-X PO) Take by mouth.      . traMADol (ULTRAM) 50 MG tablet       . WELCHOL 625 MG tablet Take 625 mg by mouth daily.       No  current facility-administered medications for this visit.    Allergies:    Allergies  Allergen Reactions  . Codeine Swelling  . Penicillins Hives and Swelling  . Wool Alcohol [Lanolin] Hives    Social History:  The patient  reports that he has never smoked. He has never used smokeless tobacco. He reports that he does not drink alcohol or use illicit drugs.   ROS:  Please see the history of present illness.   Denies any fevers, chills, orthopnea, PND, syncope  , no chest pain, no shortness of breath  PHYSICAL EXAM: VS:  BP 146/98  Pulse 90  Ht 6' (1.829 m)  Wt 210 lb (95.255 kg)  BMI 28.47 kg/m2 Well nourished, well developed, in no acute distress HEENT: normal Neck: no JVD Cardiac:  normal S1, S2; RRR; no murmur Lungs:  clear to auscultation bilaterally, no wheezing, rhonchi or rales Abd: soft, nontender, no hepatomegaly Ext: no edema Skin: warm and dry Neuro: no focal abnormalities noted  EKG:  NSR. PAT.  Nuclear stress: 03/10/12-low risk, no ischemia, EF 53%       Labs: 09/2013-LDL 80 Triglycerides 117 from 328  ASSESSMENT AND PLAN:  1. Old myocardial infarction-in his early 30s, vasospasm was made as diagnosis. No significant CAD present. Nuclear stress test in 2013 reassuring, low risk. Normal EF. 2. Diabetes-per Dr. Juleen China. Medications reviewed. 3. Hyperlipidemia-currently on atorvastatin. His pharmacist was concerned about interaction between lovastatin and diltiazem previously. Encouraging lipid panel on recheck. Labs noted as above. 4. Hypertension-currently elevated but at previous visit with Dr.Kohut well controlled. No changes made to medications. Continue to monitor. May need further agent if blood pressure greater than 140/90 consistently. 5. Paroxysmal atrial tachycardia-continue with diltiazem. 6. Coronary vasospasm-when he was changing his blood pressure medication, he states that he had some issues with vasospasm. I did clarify to him that there was no indication for a coronary stent in this situation. 7. 1 year follow up.  Signed, Donato Schultz, MD [facility]  04/06/2014 8:31 AM

## 2014-04-06 NOTE — Patient Instructions (Signed)
Please keep a check on your blood pressure.  If you notice higher than normal readings, (consistently >140/90) please make Dr. Anne Fu aware.  Your physician recommends that you continue on your current medications as directed. Please refer to the Current Medication list given to you today. Your physician wants you to follow-up in: 1 year with Dr. Anne Fu.   You will receive a reminder letter in the mail two months in advance. If you don't receive a letter, please call our office to schedule the follow-up appointment.

## 2014-06-30 DIAGNOSIS — E118 Type 2 diabetes mellitus with unspecified complications: Secondary | ICD-10-CM | POA: Diagnosis not present

## 2014-07-05 DIAGNOSIS — M5032 Other cervical disc degeneration, mid-cervical region: Secondary | ICD-10-CM | POA: Diagnosis not present

## 2014-07-05 DIAGNOSIS — M9901 Segmental and somatic dysfunction of cervical region: Secondary | ICD-10-CM | POA: Diagnosis not present

## 2014-07-05 DIAGNOSIS — M62838 Other muscle spasm: Secondary | ICD-10-CM | POA: Diagnosis not present

## 2014-07-05 DIAGNOSIS — M9902 Segmental and somatic dysfunction of thoracic region: Secondary | ICD-10-CM | POA: Diagnosis not present

## 2014-07-07 DIAGNOSIS — E118 Type 2 diabetes mellitus with unspecified complications: Secondary | ICD-10-CM | POA: Diagnosis not present

## 2014-07-07 DIAGNOSIS — R12 Heartburn: Secondary | ICD-10-CM | POA: Diagnosis not present

## 2014-07-08 DIAGNOSIS — M9902 Segmental and somatic dysfunction of thoracic region: Secondary | ICD-10-CM | POA: Diagnosis not present

## 2014-07-08 DIAGNOSIS — M9901 Segmental and somatic dysfunction of cervical region: Secondary | ICD-10-CM | POA: Diagnosis not present

## 2014-07-08 DIAGNOSIS — M62838 Other muscle spasm: Secondary | ICD-10-CM | POA: Diagnosis not present

## 2014-07-08 DIAGNOSIS — M5032 Other cervical disc degeneration, mid-cervical region: Secondary | ICD-10-CM | POA: Diagnosis not present

## 2014-07-18 DIAGNOSIS — M9901 Segmental and somatic dysfunction of cervical region: Secondary | ICD-10-CM | POA: Diagnosis not present

## 2014-07-18 DIAGNOSIS — M5032 Other cervical disc degeneration, mid-cervical region: Secondary | ICD-10-CM | POA: Diagnosis not present

## 2014-07-18 DIAGNOSIS — M9902 Segmental and somatic dysfunction of thoracic region: Secondary | ICD-10-CM | POA: Diagnosis not present

## 2014-07-18 DIAGNOSIS — M62838 Other muscle spasm: Secondary | ICD-10-CM | POA: Diagnosis not present

## 2014-07-20 DIAGNOSIS — M9902 Segmental and somatic dysfunction of thoracic region: Secondary | ICD-10-CM | POA: Diagnosis not present

## 2014-07-20 DIAGNOSIS — M5032 Other cervical disc degeneration, mid-cervical region: Secondary | ICD-10-CM | POA: Diagnosis not present

## 2014-07-20 DIAGNOSIS — M62838 Other muscle spasm: Secondary | ICD-10-CM | POA: Diagnosis not present

## 2014-07-20 DIAGNOSIS — M9901 Segmental and somatic dysfunction of cervical region: Secondary | ICD-10-CM | POA: Diagnosis not present

## 2014-08-01 DIAGNOSIS — M9902 Segmental and somatic dysfunction of thoracic region: Secondary | ICD-10-CM | POA: Diagnosis not present

## 2014-08-01 DIAGNOSIS — M5032 Other cervical disc degeneration, mid-cervical region: Secondary | ICD-10-CM | POA: Diagnosis not present

## 2014-08-01 DIAGNOSIS — M62838 Other muscle spasm: Secondary | ICD-10-CM | POA: Diagnosis not present

## 2014-08-01 DIAGNOSIS — M9901 Segmental and somatic dysfunction of cervical region: Secondary | ICD-10-CM | POA: Diagnosis not present

## 2014-08-03 DIAGNOSIS — M150 Primary generalized (osteo)arthritis: Secondary | ICD-10-CM | POA: Diagnosis not present

## 2014-08-03 DIAGNOSIS — M0589 Other rheumatoid arthritis with rheumatoid factor of multiple sites: Secondary | ICD-10-CM | POA: Diagnosis not present

## 2014-08-29 DIAGNOSIS — E118 Type 2 diabetes mellitus with unspecified complications: Secondary | ICD-10-CM | POA: Diagnosis not present

## 2014-09-05 DIAGNOSIS — E789 Disorder of lipoprotein metabolism, unspecified: Secondary | ICD-10-CM | POA: Diagnosis not present

## 2014-09-05 DIAGNOSIS — E118 Type 2 diabetes mellitus with unspecified complications: Secondary | ICD-10-CM | POA: Diagnosis not present

## 2014-09-05 DIAGNOSIS — M79643 Pain in unspecified hand: Secondary | ICD-10-CM | POA: Diagnosis not present

## 2014-09-05 DIAGNOSIS — I2510 Atherosclerotic heart disease of native coronary artery without angina pectoris: Secondary | ICD-10-CM | POA: Diagnosis not present

## 2014-09-26 DIAGNOSIS — E119 Type 2 diabetes mellitus without complications: Secondary | ICD-10-CM | POA: Diagnosis not present

## 2014-09-28 ENCOUNTER — Other Ambulatory Visit: Payer: Self-pay | Admitting: Cardiology

## 2014-09-30 ENCOUNTER — Other Ambulatory Visit: Payer: Self-pay | Admitting: Cardiology

## 2014-11-02 DIAGNOSIS — M0589 Other rheumatoid arthritis with rheumatoid factor of multiple sites: Secondary | ICD-10-CM | POA: Diagnosis not present

## 2014-11-02 DIAGNOSIS — M150 Primary generalized (osteo)arthritis: Secondary | ICD-10-CM | POA: Diagnosis not present

## 2014-11-29 DIAGNOSIS — E118 Type 2 diabetes mellitus with unspecified complications: Secondary | ICD-10-CM | POA: Diagnosis not present

## 2014-11-29 DIAGNOSIS — Z125 Encounter for screening for malignant neoplasm of prostate: Secondary | ICD-10-CM | POA: Diagnosis not present

## 2014-11-29 DIAGNOSIS — E789 Disorder of lipoprotein metabolism, unspecified: Secondary | ICD-10-CM | POA: Diagnosis not present

## 2014-12-06 DIAGNOSIS — E789 Disorder of lipoprotein metabolism, unspecified: Secondary | ICD-10-CM | POA: Diagnosis not present

## 2014-12-06 DIAGNOSIS — E118 Type 2 diabetes mellitus with unspecified complications: Secondary | ICD-10-CM | POA: Diagnosis not present

## 2014-12-06 DIAGNOSIS — I2510 Atherosclerotic heart disease of native coronary artery without angina pectoris: Secondary | ICD-10-CM | POA: Diagnosis not present

## 2014-12-06 DIAGNOSIS — Z23 Encounter for immunization: Secondary | ICD-10-CM | POA: Diagnosis not present

## 2015-03-15 DIAGNOSIS — Z23 Encounter for immunization: Secondary | ICD-10-CM | POA: Diagnosis not present

## 2015-03-15 DIAGNOSIS — M0589 Other rheumatoid arthritis with rheumatoid factor of multiple sites: Secondary | ICD-10-CM | POA: Diagnosis not present

## 2015-03-15 DIAGNOSIS — M150 Primary generalized (osteo)arthritis: Secondary | ICD-10-CM | POA: Diagnosis not present

## 2015-03-30 DIAGNOSIS — E118 Type 2 diabetes mellitus with unspecified complications: Secondary | ICD-10-CM | POA: Diagnosis not present

## 2015-04-06 DIAGNOSIS — E789 Disorder of lipoprotein metabolism, unspecified: Secondary | ICD-10-CM | POA: Diagnosis not present

## 2015-04-06 DIAGNOSIS — I10 Essential (primary) hypertension: Secondary | ICD-10-CM | POA: Diagnosis not present

## 2015-04-07 ENCOUNTER — Ambulatory Visit (INDEPENDENT_AMBULATORY_CARE_PROVIDER_SITE_OTHER): Payer: Commercial Managed Care - HMO | Admitting: Cardiology

## 2015-04-07 ENCOUNTER — Encounter: Payer: Self-pay | Admitting: Cardiology

## 2015-04-07 VITALS — BP 150/88 | HR 96 | Ht 72.0 in | Wt 219.4 lb

## 2015-04-07 DIAGNOSIS — I252 Old myocardial infarction: Secondary | ICD-10-CM

## 2015-04-07 DIAGNOSIS — I10 Essential (primary) hypertension: Secondary | ICD-10-CM | POA: Diagnosis not present

## 2015-04-07 DIAGNOSIS — I2583 Coronary atherosclerosis due to lipid rich plaque: Secondary | ICD-10-CM

## 2015-04-07 DIAGNOSIS — I201 Angina pectoris with documented spasm: Secondary | ICD-10-CM

## 2015-04-07 DIAGNOSIS — I2510 Atherosclerotic heart disease of native coronary artery without angina pectoris: Secondary | ICD-10-CM | POA: Diagnosis not present

## 2015-04-07 DIAGNOSIS — E785 Hyperlipidemia, unspecified: Secondary | ICD-10-CM

## 2015-04-07 NOTE — Progress Notes (Signed)
[address] [place], [place]  [address] Phone: [phone] Fax:  [phone]  Date:  04/07/2015   ID:  William Hunter, William Hunter May 31, 1946, MRN [id number]  PCP:  Michiel Sites, MD Dr. Juleen China  History of Present Illness: William Hunter is a 69 y.o. male history of myocardial infarction at age 89 with heart catheterization in 1981 by Dr. Aleen Campi with no coronary artery disease, diagnosed with vasospasm who had cholelithiasis in 2013, diabetes, hyperlipidemia, hypertension here for followup.   Diagnosed with RA. Had one bout of coronary vasospasm when changing blood pressure medications.  Also used to be on lovastatin but had some concerns with interaction with diltiazem by pharmacy. He is currently on atorvastatin.  Currently doing well, no chest pain, no shortness of breath. No orthopnea. He has gained some weight recently. His blood pressure also has been elevated. Dr. Juleen China is working on this.   Wt Readings from Last 3 Encounters:  04/07/15 219 lb 6.4 oz (99.519 kg)  04/06/14 210 lb (95.255 kg)  07/19/13 208 lb 6.4 oz (94.53 kg)     Past Medical History  Diagnosis Date  . Hypertension   . Coronary artery disease   . MI, old     at age 4  . Dyslipidemia   . Cold feet     "from my diabetes; they stay cold" (03/09/2012)  . Anginal pain ([name]   . Type II diabetes mellitus ([name]   . Pneumonia ~ 1962; 1970's    "double"; "regular" (03/09/2012)  . Headache(784.0)   . Migraines   . Arthritis     "fingers" (03/09/2012)  . Thyroid disease     Dr Lucianne Muss  . Slipped cervical disc 2008    Dr Patsi Sears in the past/ Dr Larita Fife , chiropractor in 2008  . Hx of plastic surgery     plastic surgery following a fall off a loading dock ,lost  all his upper teeth   . Coronary artery spasm [facility])     since age 72 Dr Aleen Campi  . Hx of gallstones     Dr Derrell Lolling lap cholecystectomy  . Rheumatoid arthritis ([name]     Dr Dierdre Forth     Past Surgical History  Procedure  Laterality Date  . Penile prosthesis implant  1983  . Cardiac catheterization      x 3 with NO stents   . Cholecystectomy  03/11/2012    Procedure: LAPAROSCOPIC CHOLECYSTECTOMY WITH INTRAOPERATIVE CHOLANGIOGRAM;  Surgeon: Ernestene Mention, MD;  Location: [facility] OR;  Service: General;  Laterality: N/A;    Current Outpatient Prescriptions  Medication Sig Dispense Refill  . atorvastatin (LIPITOR) 40 MG tablet Take 1 tablet (40 mg total) by mouth daily. 30 tablet 3  . Famotidine (PEPCID PO) Take 1 tablet by mouth daily.    Marland Kitchen glimepiride (AMARYL) 4 MG tablet Take 4 mg by mouth daily before breakfast.    . JANUVIA 100 MG tablet     . leflunomide (ARAVA) 20 MG tablet     . metFORMIN (GLUCOPHAGE) 500 MG tablet Take 500 mg by mouth 4 (four) times daily.     . Simethicone (GAS-X PO) Take by mouth.    . TAZTIA XT 300 MG 24 hr capsule TAKE 1 CAPSULE BY MOUTH DAILY. 30 capsule 6  . traMADol (ULTRAM) 50 MG tablet     . WELCHOL 625 MG tablet Take 625 mg by mouth daily.     No current facility-administered medications for  this visit.    Allergies:    Allergies  Allergen Reactions  . Codeine Swelling  . Penicillins Hives and Swelling  . Wool Alcohol [Lanolin] Hives    Social History:  The patient  reports that he has never smoked. He has never used smokeless tobacco. He reports that he does not drink alcohol or use illicit drugs.   ROS:  Please see the history of present illness.   Denies any fevers, chills, orthopnea, PND, syncope  , no chest pain, no shortness of breath  PHYSICAL EXAM: VS:  BP 150/88 mmHg  Pulse 96  Ht 6' (1.829 m)  Wt 219 lb 6.4 oz (99.519 kg)  BMI 29.75 kg/m2 Well nourished, well developed, in no acute distress HEENT: normal Neck: no JVD Cardiac:  normal S1, S2; RRR; no murmur Lungs:  clear to auscultation bilaterally, no wheezing, rhonchi or rales Abd: soft, nontender, no hepatomegaly Ext: no edema Skin: warm and dry Neuro: no focal abnormalities noted  EKG:  Today  04/07/15- normal sinus rhythm, 88, no other abnormalities noted. Personally viewed-priorNSR. PAT.  Nuclear stress: 03/10/12-low risk, no ischemia, EF 53%     Labs: 09/2013-LDL 80 Triglycerides 117 from 328  ASSESSMENT AND PLAN:  1. Old myocardial infarction-in his early 30s, vasospasm was made as diagnosis. No significant CAD present. Nuclear stress test in 2013 reassuring, low risk. Normal EF. 2. Diabetes-per Dr. Juleen China. Medications reviewed. Hemoglobin A1c 9.1 previously. Continuing to work with Dr. Juleen China on this. 3. Hyperlipidemia-currently on atorvastatin. His pharmacist was concerned about interaction between lovastatin and diltiazem previously. Encouraging lipid panel on recheck. Labs noted as above. Last LDL 77. He has not been on WelChol for some time he states. We will take off the list. 4. Hypertension-currently elevated  Saw Dr.Kohut recently and new medication has been prescribed.  Continue to monitor. May need further agent if blood pressure greater than 140/90 consistently. Weight loss will be helpful as well. 5. Paroxysmal atrial tachycardia-continue with diltiazem. No significant issues. 6. Coronary vasospasm-when he was changing his blood pressure medication, he states that he had some issues with vasospasm. I did clarify to him that there was no indication for a coronary stent in this situation during a prior visit. 7. 1 year follow up.  Signed, Donato Schultz, MD [facility]  04/07/2015 8:16 AM

## 2015-04-07 NOTE — Patient Instructions (Signed)
Medication Instructions:  No changes today  Labwork: None today  Testing/Procedures: None today  Follow-Up: Your physician wants you to follow-up in: 1 year with Dr Anne Fu. (October 2017).  You will receive a reminder letter in the mail two months in advance. If you don't receive a letter, please call our office to schedule the follow-up appointment.     If you need a refill on your cardiac medications before your next appointment, please call your pharmacy.

## 2015-04-10 DIAGNOSIS — M9903 Segmental and somatic dysfunction of lumbar region: Secondary | ICD-10-CM | POA: Diagnosis not present

## 2015-04-10 DIAGNOSIS — M9904 Segmental and somatic dysfunction of sacral region: Secondary | ICD-10-CM | POA: Diagnosis not present

## 2015-04-10 DIAGNOSIS — M5136 Other intervertebral disc degeneration, lumbar region: Secondary | ICD-10-CM | POA: Diagnosis not present

## 2015-04-10 DIAGNOSIS — M9902 Segmental and somatic dysfunction of thoracic region: Secondary | ICD-10-CM | POA: Diagnosis not present

## 2015-04-13 DIAGNOSIS — M9904 Segmental and somatic dysfunction of sacral region: Secondary | ICD-10-CM | POA: Diagnosis not present

## 2015-04-13 DIAGNOSIS — M5136 Other intervertebral disc degeneration, lumbar region: Secondary | ICD-10-CM | POA: Diagnosis not present

## 2015-04-13 DIAGNOSIS — M9902 Segmental and somatic dysfunction of thoracic region: Secondary | ICD-10-CM | POA: Diagnosis not present

## 2015-04-13 DIAGNOSIS — M9903 Segmental and somatic dysfunction of lumbar region: Secondary | ICD-10-CM | POA: Diagnosis not present

## 2015-04-14 ENCOUNTER — Encounter: Payer: Self-pay | Admitting: Cardiology

## 2015-04-26 ENCOUNTER — Other Ambulatory Visit: Payer: Self-pay | Admitting: Cardiology

## 2015-04-27 ENCOUNTER — Other Ambulatory Visit: Payer: Self-pay

## 2015-04-27 MED ORDER — DILTIAZEM HCL ER BEADS 300 MG PO CP24: 300.0000 mg | ORAL_CAPSULE | Freq: Every day | ORAL | Status: DC

## 2015-06-14 ENCOUNTER — Emergency Department (HOSPITAL_COMMUNITY): Payer: Commercial Managed Care - HMO

## 2015-06-14 ENCOUNTER — Emergency Department (HOSPITAL_COMMUNITY)
Admission: EM | Admit: 2015-06-14 | Discharge: 2015-06-14 | Disposition: A | Discharge status: Home / Self Care | Payer: Commercial Managed Care - HMO | Attending: Emergency Medicine | Admitting: Emergency Medicine

## 2015-06-14 DIAGNOSIS — Z9889 Other specified postprocedural states: Secondary | ICD-10-CM | POA: Insufficient documentation

## 2015-06-14 DIAGNOSIS — I252 Old myocardial infarction: Secondary | ICD-10-CM | POA: Insufficient documentation

## 2015-06-14 DIAGNOSIS — R0602 Shortness of breath: Secondary | ICD-10-CM | POA: Diagnosis not present

## 2015-06-14 DIAGNOSIS — Z79899 Other long term (current) drug therapy: Secondary | ICD-10-CM | POA: Insufficient documentation

## 2015-06-14 DIAGNOSIS — E119 Type 2 diabetes mellitus without complications: Secondary | ICD-10-CM | POA: Insufficient documentation

## 2015-06-14 DIAGNOSIS — E785 Hyperlipidemia, unspecified: Secondary | ICD-10-CM | POA: Diagnosis not present

## 2015-06-14 DIAGNOSIS — Z8701 Personal history of pneumonia (recurrent): Secondary | ICD-10-CM | POA: Insufficient documentation

## 2015-06-14 DIAGNOSIS — Z7984 Long term (current) use of oral hypoglycemic drugs: Secondary | ICD-10-CM | POA: Diagnosis not present

## 2015-06-14 DIAGNOSIS — M069 Rheumatoid arthritis, unspecified: Secondary | ICD-10-CM | POA: Diagnosis not present

## 2015-06-14 DIAGNOSIS — I25119 Atherosclerotic heart disease of native coronary artery with unspecified angina pectoris: Secondary | ICD-10-CM | POA: Diagnosis not present

## 2015-06-14 DIAGNOSIS — R0789 Other chest pain: Secondary | ICD-10-CM | POA: Diagnosis not present

## 2015-06-14 DIAGNOSIS — Z8719 Personal history of other diseases of the digestive system: Secondary | ICD-10-CM | POA: Insufficient documentation

## 2015-06-14 DIAGNOSIS — R079 Chest pain, unspecified: Secondary | ICD-10-CM | POA: Diagnosis not present

## 2015-06-14 DIAGNOSIS — I10 Essential (primary) hypertension: Secondary | ICD-10-CM | POA: Insufficient documentation

## 2015-06-14 DIAGNOSIS — R072 Precordial pain: Secondary | ICD-10-CM | POA: Diagnosis not present

## 2015-06-14 DIAGNOSIS — Z880 Allergy status to penicillin: Secondary | ICD-10-CM | POA: Insufficient documentation

## 2015-06-14 LAB — BASIC METABOLIC PANEL
Anion gap: 12 (ref 5–15)
BUN: 11 mg/dL (ref 6–20)
CHLORIDE: 105 mmol/L (ref 101–111)
CO2: 22 mmol/L (ref 22–32)
CREATININE: 0.81 mg/dL (ref 0.61–1.24)
Calcium: 9.1 mg/dL (ref 8.9–10.3)
GFR calc Af Amer: >60 mL/min (ref >60)
Glucose, Bld: 181 mg/dL — ABNORMAL HIGH (ref 65–99)
Potassium: 4 mmol/L (ref 3.5–5.1)
SODIUM: 139 mmol/L (ref 135–145)

## 2015-06-14 LAB — CBC
HCT: 41.4 % (ref 39.0–52.0)
HEMOGLOBIN: 13.9 g/dL (ref 13.0–17.0)
MCH: 29.4 pg (ref 26.0–34.0)
MCHC: 33.6 g/dL (ref 30.0–36.0)
MCV: 87.7 fL (ref 78.0–100.0)
PLATELETS: 240 10*3/uL (ref 150–400)
RBC: 4.72 MIL/uL (ref 4.22–5.81)
RDW: 14 % (ref 11.5–15.5)
WBC: 8.5 10*3/uL (ref 4.0–10.5)

## 2015-06-14 LAB — I-STAT TROPONIN, ED: TROPONIN I, POC: 0 ng/mL (ref 0.00–0.08)

## 2015-06-14 IMAGING — CR DG CHEST 1V PORT
1 series · 1 of 1 positions shown · non-contrast
Comparison: [DATE].

CLINICAL DATA: Chest pain.

EXAM:
PORTABLE CHEST 1 VIEW

[AP]
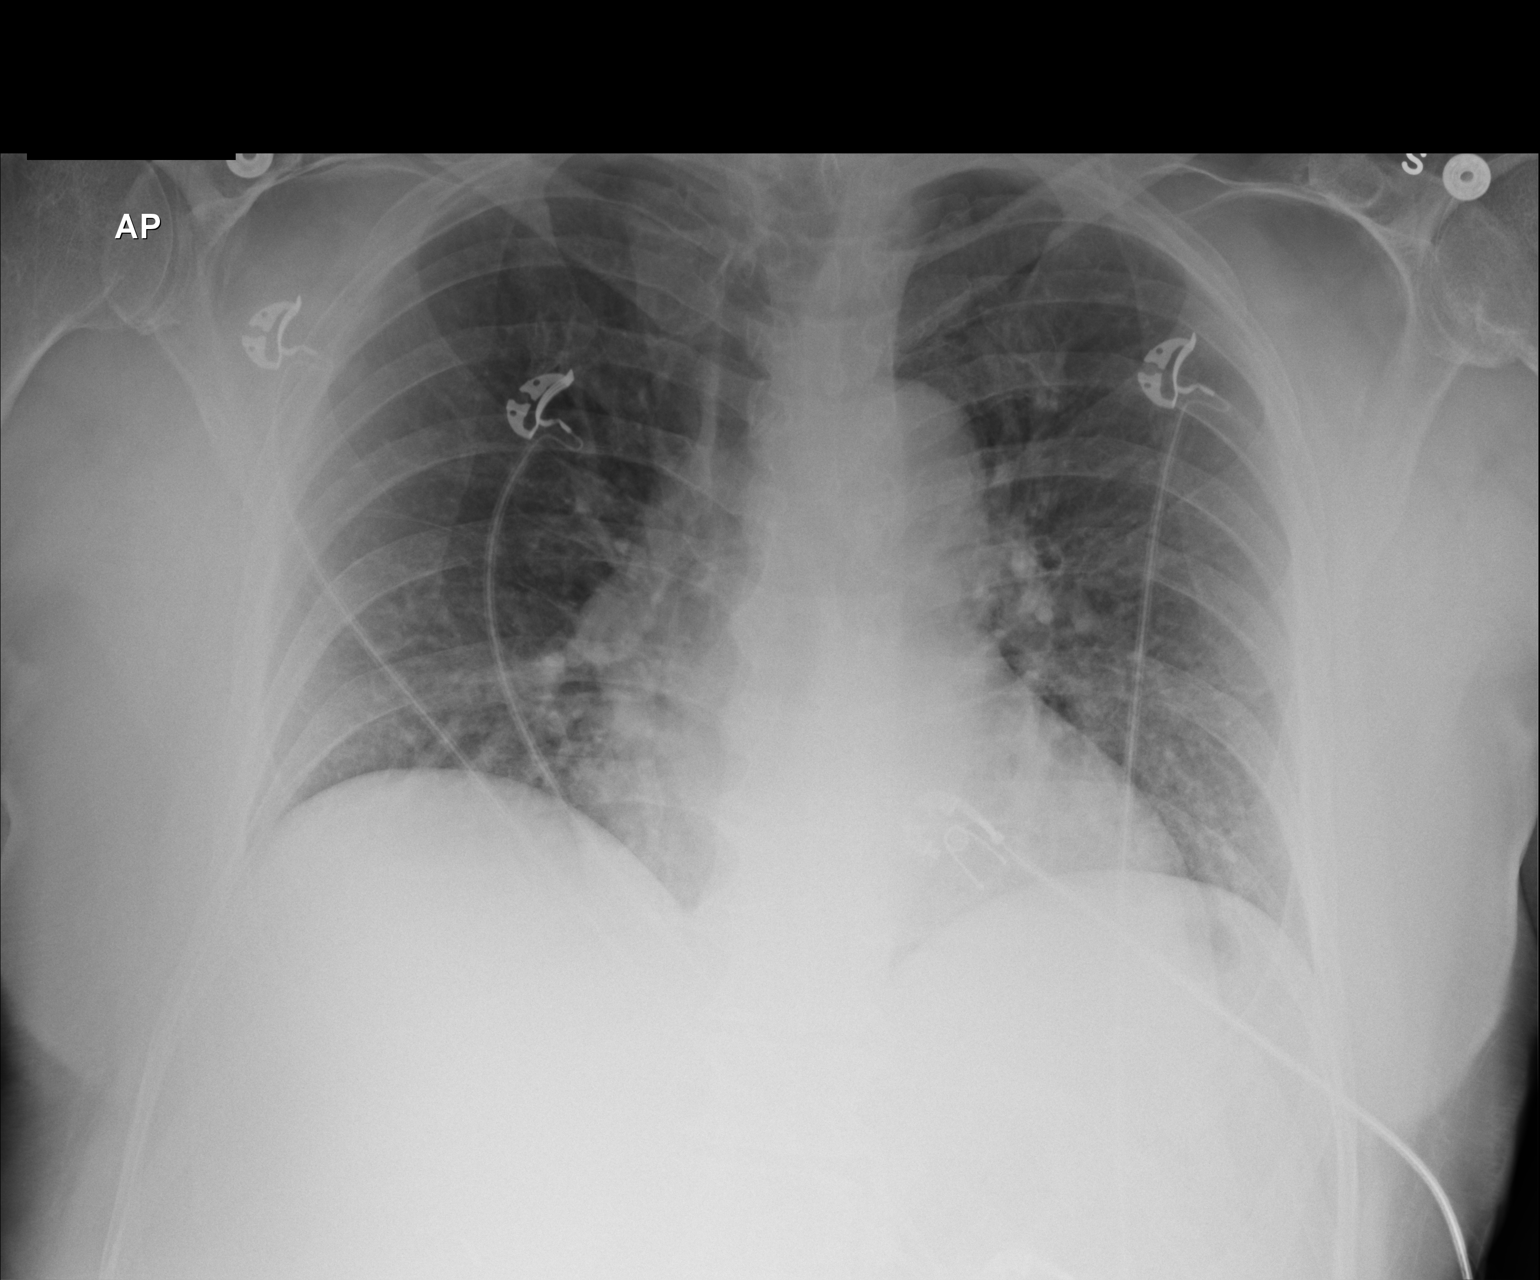

[1 of 1 positions shown; findings below may reference images not displayed]

FINDINGS: The heart size and mediastinal contours are within normal limits. No
pneumothorax or pleural effusion is noted. Stable bilateral
peribronchial thickening is noted at both lung bases which most
likely is chronic. No acute pulmonary disease is noted. The
visualized skeletal structures are unremarkable.
IMPRESSION: No acute cardiopulmonary abnormality seen.

## 2015-06-14 NOTE — ED Notes (Signed)
Pt c/o generalized chest pain since Sunday. Pt reports some shortness of breath and occasional dizziness/lightheadedness. Denies any other associated symptoms. Hx MI 2yrs ago. Pain usually constant but worsens in severity at times. Reports pulsing pain. Pt states this does not feel similar to previous MI pain.

## 2015-06-14 NOTE — ED Provider Notes (Signed)
CSN: [id number]     Arrival date & time 06/14/15  1696 History   First MD Initiated Contact with Patient 06/14/15 [phone]     Chief Complaint  Patient presents with  . Chest Pain     (Consider location/radiation/quality/duration/timing/severity/associated sxs/prior Treatment) Patient is a 70 y.o. male presenting with chest pain. The history is provided by the patient.  Chest Pain Pain location:  Substernal area, epigastric, L lateral chest and R lateral chest Pain quality: sharp and throbbing   Pain radiates to:  Does not radiate Pain radiates to the back: no   Pain severity:  Moderate Onset quality:  Gradual Duration:  2 days Timing:  Intermittent Progression:  Waxing and waning Chronicity:  Recurrent Context: at rest   Relieved by:  Nothing Worsened by:  Nothing tried Ineffective treatments:  None tried Associated symptoms: shortness of breath (mild)   Associated symptoms: no abdominal pain, no cough and no fever   Risk factors: coronary artery disease ( prior vasospasm, no stents ), high cholesterol and hypertension   Risk factors: no smoking     Past Medical History  Diagnosis Date  . Hypertension   . Coronary artery disease   . MI, old     at age 63  . Dyslipidemia   . Cold feet     "from my diabetes; they stay cold" (03/09/2012)  . Anginal pain ([name]   . Type II diabetes mellitus ([name]   . Pneumonia ~ 1962; 1970's    "double"; "regular" (03/09/2012)  . Headache(784.0)   . Migraines   . Arthritis     "fingers" (03/09/2012)  . Thyroid disease     Dr Lucianne Muss  . Slipped cervical disc 2008    Dr Patsi Sears in the past/ Dr Larita Fife , chiropractor in 2008  . Hx of plastic surgery     plastic surgery following a fall off a loading dock ,lost  all his upper teeth   . Coronary artery spasm [facility])     since age 46 Dr Aleen Campi  . Hx of gallstones     Dr Derrell Lolling lap cholecystectomy  . Rheumatoid arthritis ([name]     Dr Dierdre Forth    Past Surgical History  Procedure Laterality  Date  . Penile prosthesis implant  1983  . Cardiac catheterization      x 3 with NO stents   . Cholecystectomy  03/11/2012    Procedure: LAPAROSCOPIC CHOLECYSTECTOMY WITH INTRAOPERATIVE CHOLANGIOGRAM;  Surgeon: Ernestene Mention, MD;  Location: [facility] OR;  Service: General;  Laterality: N/A;   Family History  Problem Relation Age of Onset  . Heart failure Mother   . Cancer - Other Mother     breast  . Heart disease Father   . Cancer - Other Sister     tounge cancer   Social History  Substance Use Topics  . Smoking status: Never Smoker   . Smokeless tobacco: Never Used  . Alcohol Use: No    Review of Systems  Constitutional: Negative for fever.  Respiratory: Positive for shortness of breath (mild). Negative for cough.   Cardiovascular: Positive for chest pain.  Gastrointestinal: Negative for abdominal pain.  All other systems reviewed and are negative.     Allergies  Codeine; Penicillins; and Wool alcohol  Home Medications   Prior to Admission medications   Medication Sig Start Date End Date Taking? Authorizing Provider  atorvastatin (LIPITOR) 40 MG tablet Take 1 tablet (40 mg total) by mouth daily. 07/19/13  Yes Mark C  Skains, MD  benazepril (LOTENSIN) 10 MG tablet Take 10 mg by mouth 2 (two) times daily. 05/11/15  Yes Historical Provider, MD  diltiazem (TAZTIA XT) 300 MG 24 hr capsule Take 1 capsule (300 mg total) by mouth daily. 04/27/15  Yes Jake Bathe, MD  Famotidine (PEPCID PO) Take 1 tablet by mouth 2 (two) times daily.    Yes Historical Provider, MD  glimepiride (AMARYL) 4 MG tablet Take 4 mg by mouth daily before breakfast.   Yes Historical Provider, MD  leflunomide (ARAVA) 20 MG tablet Take 20 mg by mouth daily.  03/11/14  Yes Historical Provider, MD  WELCHOL 625 MG tablet Take 1,250 mg by mouth daily. 05/15/15  Yes Historical Provider, MD   BP 150/82 mmHg  Pulse 74  Temp(Src) 98.2 F (36.8 C) (Oral)  Resp 23  SpO2 98% Physical Exam  Constitutional: He is  oriented to person, place, and time. He appears well-developed and well-nourished. No distress.  HENT:  Head: Normocephalic and atraumatic.  Eyes: Conjunctivae are normal.  Neck: Neck supple. No tracheal deviation present.  Cardiovascular: Normal rate, regular rhythm and normal heart sounds.   Pulmonary/Chest: Effort normal and breath sounds normal. No respiratory distress. He has no wheezes. He has no rales. He exhibits tenderness (centrally, reproducing symptoms).  Abdominal: Soft. He exhibits no distension. There is no tenderness.  Neurological: He is alert and oriented to person, place, and time.  Skin: Skin is warm and dry.  Psychiatric: He has a normal mood and affect.    ED Course  Procedures (including critical care time) Labs Review Labs Reviewed  BASIC METABOLIC PANEL - Abnormal; Notable for the following:    Glucose, Bld 181 (*)    All other components within normal limits  CBC  I-STAT TROPOININ, ED    Imaging Review Dg Chest Portable 1 View  06/14/2015  CLINICAL DATA:  Chest pain. EXAM: PORTABLE CHEST 1 VIEW COMPARISON:  March 09, 2012. FINDINGS: The heart size and mediastinal contours are within normal limits. No pneumothorax or pleural effusion is noted. Stable bilateral peribronchial thickening is noted at both lung bases which most likely is chronic. No acute pulmonary disease is noted. The visualized skeletal structures are unremarkable. IMPRESSION: No acute cardiopulmonary abnormality seen. Electronically Signed   By: Lupita Raider, M.D.   On: 06/14/2015 10:27   I have personally reviewed and evaluated these images and lab results as part of my medical decision-making.   EKG Interpretation   Date/Time:  Wednesday June 14 2015 09:43:39 EST Ventricular Rate:  74 PR Interval:  159 QRS Duration: 87 QT Interval:  357 QTC Calculation: 396 R Axis:   64 Text Interpretation:  Sinus rhythm Baseline wander in lead(s) V1 Normal  ECG Confirmed by Florean Hoobler MD, Reuel Boom  ([id number]) on 06/14/2015 9:47:35 AM      MDM   Final diagnoses:  Chest wall pain    70 year old male with history of prior coronary vasospasm presents with intermittent chest pain, mild shortness of breath and a feeling of discomfort that is sharp, atypical in nature and extends over his epigastrium and across his lower chest. No acute abdomen mellitus certified on EKG, screening chest x-ray, troponin is negative despite 2 days of ongoing pain. Reproducible palpation. No significant hematologic or metabolic abnormalities to explain symptoms. At this time appears noncardiac in nature, patient has cardiologist who saw him 3 months ago and can routinely follow up with them. Plan to follow up with PCP as needed and return precautions  discussed for worsening or new concerning symptoms.     Lyndal Pulley, MD 06/14/15 1046

## 2015-06-14 NOTE — Discharge Instructions (Signed)

## 2015-06-29 DIAGNOSIS — E789 Disorder of lipoprotein metabolism, unspecified: Secondary | ICD-10-CM | POA: Diagnosis not present

## 2015-06-29 DIAGNOSIS — Z0000 Encounter for general adult medical examination without abnormal findings: Secondary | ICD-10-CM | POA: Diagnosis not present

## 2015-06-29 DIAGNOSIS — E118 Type 2 diabetes mellitus with unspecified complications: Secondary | ICD-10-CM | POA: Diagnosis not present

## 2015-07-06 DIAGNOSIS — R079 Chest pain, unspecified: Secondary | ICD-10-CM | POA: Diagnosis not present

## 2015-07-06 DIAGNOSIS — E118 Type 2 diabetes mellitus with unspecified complications: Secondary | ICD-10-CM | POA: Diagnosis not present

## 2015-07-06 DIAGNOSIS — E789 Disorder of lipoprotein metabolism, unspecified: Secondary | ICD-10-CM | POA: Diagnosis not present

## 2015-07-06 DIAGNOSIS — M79643 Pain in unspecified hand: Secondary | ICD-10-CM | POA: Diagnosis not present

## 2015-07-17 DIAGNOSIS — M9902 Segmental and somatic dysfunction of thoracic region: Secondary | ICD-10-CM | POA: Diagnosis not present

## 2015-07-17 DIAGNOSIS — M9901 Segmental and somatic dysfunction of cervical region: Secondary | ICD-10-CM | POA: Diagnosis not present

## 2015-07-17 DIAGNOSIS — M5033 Other cervical disc degeneration, cervicothoracic region: Secondary | ICD-10-CM | POA: Diagnosis not present

## 2015-07-17 DIAGNOSIS — M150 Primary generalized (osteo)arthritis: Secondary | ICD-10-CM | POA: Diagnosis not present

## 2015-07-17 DIAGNOSIS — M0589 Other rheumatoid arthritis with rheumatoid factor of multiple sites: Secondary | ICD-10-CM | POA: Diagnosis not present

## 2015-08-16 DIAGNOSIS — M9901 Segmental and somatic dysfunction of cervical region: Secondary | ICD-10-CM | POA: Diagnosis not present

## 2015-08-16 DIAGNOSIS — M9902 Segmental and somatic dysfunction of thoracic region: Secondary | ICD-10-CM | POA: Diagnosis not present

## 2015-08-16 DIAGNOSIS — M5033 Other cervical disc degeneration, cervicothoracic region: Secondary | ICD-10-CM | POA: Diagnosis not present

## 2015-09-21 DIAGNOSIS — M5033 Other cervical disc degeneration, cervicothoracic region: Secondary | ICD-10-CM | POA: Diagnosis not present

## 2015-09-21 DIAGNOSIS — M9901 Segmental and somatic dysfunction of cervical region: Secondary | ICD-10-CM | POA: Diagnosis not present

## 2015-09-21 DIAGNOSIS — M9902 Segmental and somatic dysfunction of thoracic region: Secondary | ICD-10-CM | POA: Diagnosis not present

## 2015-09-27 DIAGNOSIS — E789 Disorder of lipoprotein metabolism, unspecified: Secondary | ICD-10-CM | POA: Diagnosis not present

## 2015-09-27 DIAGNOSIS — E118 Type 2 diabetes mellitus with unspecified complications: Secondary | ICD-10-CM | POA: Diagnosis not present

## 2015-09-27 DIAGNOSIS — M0589 Other rheumatoid arthritis with rheumatoid factor of multiple sites: Secondary | ICD-10-CM | POA: Diagnosis not present

## 2015-10-10 DIAGNOSIS — E118 Type 2 diabetes mellitus with unspecified complications: Secondary | ICD-10-CM | POA: Diagnosis not present

## 2015-10-10 DIAGNOSIS — E789 Disorder of lipoprotein metabolism, unspecified: Secondary | ICD-10-CM | POA: Diagnosis not present

## 2015-10-10 DIAGNOSIS — I2510 Atherosclerotic heart disease of native coronary artery without angina pectoris: Secondary | ICD-10-CM | POA: Diagnosis not present

## 2016-01-11 DIAGNOSIS — E118 Type 2 diabetes mellitus with unspecified complications: Secondary | ICD-10-CM | POA: Diagnosis not present

## 2016-01-11 DIAGNOSIS — E789 Disorder of lipoprotein metabolism, unspecified: Secondary | ICD-10-CM | POA: Diagnosis not present

## 2016-01-24 DIAGNOSIS — I10 Essential (primary) hypertension: Secondary | ICD-10-CM | POA: Diagnosis not present

## 2016-01-24 DIAGNOSIS — E118 Type 2 diabetes mellitus with unspecified complications: Secondary | ICD-10-CM | POA: Diagnosis not present

## 2016-01-24 DIAGNOSIS — I2510 Atherosclerotic heart disease of native coronary artery without angina pectoris: Secondary | ICD-10-CM | POA: Diagnosis not present

## 2016-01-25 ENCOUNTER — Encounter: Payer: Self-pay | Admitting: Cardiology

## 2016-04-02 DIAGNOSIS — L309 Dermatitis, unspecified: Secondary | ICD-10-CM | POA: Diagnosis not present

## 2016-04-09 ENCOUNTER — Ambulatory Visit (INDEPENDENT_AMBULATORY_CARE_PROVIDER_SITE_OTHER): Payer: Commercial Managed Care - HMO | Admitting: Cardiology

## 2016-04-09 ENCOUNTER — Encounter: Payer: Self-pay | Admitting: Cardiology

## 2016-04-09 ENCOUNTER — Encounter (INDEPENDENT_AMBULATORY_CARE_PROVIDER_SITE_OTHER): Payer: Self-pay

## 2016-04-09 VITALS — BP 118/64 | HR 104 | Ht 72.0 in | Wt 207.6 lb

## 2016-04-09 DIAGNOSIS — I201 Angina pectoris with documented spasm: Secondary | ICD-10-CM

## 2016-04-09 DIAGNOSIS — E785 Hyperlipidemia, unspecified: Secondary | ICD-10-CM

## 2016-04-09 DIAGNOSIS — I252 Old myocardial infarction: Secondary | ICD-10-CM | POA: Diagnosis not present

## 2016-04-09 DIAGNOSIS — I10 Essential (primary) hypertension: Secondary | ICD-10-CM

## 2016-04-09 NOTE — Patient Instructions (Signed)
Medication Instructions:  Your physician recommends that you continue on your current medications as directed. Please refer to the Current Medication list given to you today.   Labwork: None  Testing/Procedures: None   Follow-Up: Your physician wants you to follow-up in: 2 years with Dr Skains. (October 2019).  You will receive a reminder letter in the mail two months in advance. If you don't receive a letter, please call our office to schedule the follow-up appointment.        If you need a refill on your cardiac medications before your next appointment, please call your pharmacy.   

## 2016-04-09 NOTE — Progress Notes (Signed)
[address], [address] [place], [place]  [address] Phone: [phone] Fax:  [phone]  Date:  04/09/2016   ID:  William Hunter, William Hunter Oct 12, 1945, MRN [id number]  PCP:  Michiel Sites, MD Dr. Juleen China  History of Present Illness: William Hunter is a 70 y.o. male history of myocardial infarction at age 6 with heart catheterization in 1981 by Dr. Aleen Campi with no coronary artery disease, diagnosed with vasospasm who had cholelithiasis in 2013, diabetes, hyperlipidemia, hypertension here for followup.   Diagnosed with RA. Had one bout of coronary vasospasm when changing blood pressure medications.  Also used to be on lovastatin but had some concerns with interaction with diltiazem by pharmacy. He is currently on atorvastatin.  Currently doing well, no chest pain, no shortness of breath. No orthopnea. He is taking a diabetes study drug. Has occasional EKG with this. He also has a case of poison IV left arm. Dr. Juleen China is working on this.  Wt Readings from Last 3 Encounters:  04/09/16 207 lb 9.6 oz (94.2 kg)  04/07/15 219 lb 6.4 oz (99.5 kg)  04/06/14 210 lb (95.3 kg)     Past Medical History:  Diagnosis Date  . Anginal pain ([name]   . Arthritis    "fingers" (03/09/2012)  . Cold feet    "from my diabetes; they stay cold" (03/09/2012)  . Coronary artery disease   . Coronary artery spasm [facility])    since age 30 Dr Aleen Campi  . Dyslipidemia   . Headache(784.0)   . Hx of gallstones    Dr Derrell Lolling lap cholecystectomy  . Hx of plastic surgery    plastic surgery following a fall off a loading dock ,lost  all his upper teeth   . Hypertension   . MI, old    at age 18  . Migraines   . Pneumonia ~ 1962; 1970's   "double"; "regular" (03/09/2012)  . Rheumatoid arthritis ([name]    Dr Dierdre Forth   . Slipped cervical disc 2008   Dr Patsi Sears in the past/ Dr Larita Fife , chiropractor in 2008  . Thyroid disease    Dr Lucianne Muss  . Type II diabetes mellitus ([name]     Past Surgical History:    Procedure Laterality Date  . CARDIAC CATHETERIZATION     x 3 with NO stents   . CHOLECYSTECTOMY  03/11/2012   Procedure: LAPAROSCOPIC CHOLECYSTECTOMY WITH INTRAOPERATIVE CHOLANGIOGRAM;  Surgeon: Ernestene Mention, MD;  Location: [facility] OR;  Service: General;  Laterality: N/A;  . PENILE PROSTHESIS IMPLANT  1983    Current Outpatient Prescriptions  Medication Sig Dispense Refill  . atorvastatin (LIPITOR) 40 MG tablet Take 1 tablet (40 mg total) by mouth daily. 30 tablet 3  . benazepril (LOTENSIN) 10 MG tablet Take 10 mg by mouth 2 (two) times daily.    Marland Kitchen diltiazem (TAZTIA XT) 300 MG 24 hr capsule Take 1 capsule (300 mg total) by mouth daily. 30 capsule 11  . glimepiride (AMARYL) 4 MG tablet Take 4 mg by mouth daily before breakfast.    . loratadine (CLARITIN) 10 MG tablet Take 10 mg by mouth 2 (two) times daily.    . metFORMIN (GLUCOPHAGE) 500 MG tablet Take 500 mg by mouth 3 (three) times daily.    Marland Kitchen UNABLE TO FIND Study drug for diabetes patient takes 1 tablet by mouth 2 times a day.     No current facility-administered medications for this visit.     Allergies:  Allergies  Allergen Reactions  . Codeine Swelling  . Penicillins Hives and Swelling    Has patient had a PCN reaction causing immediate rash, facial/tongue/throat swelling, SOB or lightheadedness with hypotension: Yes Has patient had a PCN reaction causing severe rash involving mucus membranes or skin necrosis: No Has patient had a PCN reaction that required hospitalization No Has patient had a PCN reaction occurring within the last 10 years: No If all of the above answers are "NO", then may proceed with Cephalosporin use.   Pricilla Handler Alcohol [Lanolin] Hives    Social History:  The patient  reports that he has never smoked. He has never used smokeless tobacco. He reports that he does not drink alcohol or use drugs.   ROS:  Please see the history of present illness.   Denies any fevers, chills, orthopnea, PND, syncope  , no  chest pain, no shortness of breath  PHYSICAL EXAM: VS:  BP 118/64   Pulse (!) 104   Ht 6' (1.829 m)   Wt 207 lb 9.6 oz (94.2 kg)   BMI 28.16 kg/m  Well nourished, well developed, in no acute distress HEENT: normal Neck: no JVD Cardiac:  normal S1, S2; RRR; no murmur Lungs:  clear to auscultation bilaterally, no wheezing, rhonchi or rales Abd: soft, nontender, no hepatomegaly Ext: no edema Skin: warm and dryRash left arm-poison ivy Neuro: no focal abnormalities noted  EKG:  06/14/15 in emergency department shows sinus rhythm with no other significant abnormalities-previously 04/07/15- normal sinus rhythm, 88, no other abnormalities noted. Personally viewed-prior NSR. PAT.   Nuclear stress: 03/10/12-low risk, no ischemia, EF 53%      Labs: 09/2013-LDL 80 Triglycerides 117 from 328  ASSESSMENT AND PLAN:  1. Old myocardial infarction-in his early 30s, vasospasm was made as diagnosis. No significant CAD present. Nuclear stress test in 2013 reassuring, low risk. Normal EF. 2. Diabetes-per Dr. Juleen China. Medications reviewed. Hemoglobin A1c 9.1 previously. Continuing to work with Dr. Juleen China on this. 3. Hyperlipidemia-currently on atorvastatin. His pharmacist was concerned about interaction between lovastatin and diltiazem previously. Encouraging lipid panel on recheck. Labs noted as above. Last LDL 77. He has not been on WelChol for some time he states. We will take off the list. 4. Hypertension-currently elevated  Saw Dr.Kohut recently and new medication has been prescribed.  Continue to monitor. May need further agent if blood pressure greater than 140/90 consistently. Weight loss will be helpful as well. 5. Paroxysmal atrial tachycardia-continue with diltiazem. No significant issues. 6. Coronary vasospasm-when he was changing his blood pressure medication, he states that he had some issues with vasospasm. I did clarify to him that there was no indication for a coronary stent in this situation  during a prior visit. 7. 1 year follow up.  Signed, Donato Schultz, MD [facility]  04/09/2016 8:32 AM

## 2016-04-18 DIAGNOSIS — E789 Disorder of lipoprotein metabolism, unspecified: Secondary | ICD-10-CM | POA: Diagnosis not present

## 2016-04-18 DIAGNOSIS — E118 Type 2 diabetes mellitus with unspecified complications: Secondary | ICD-10-CM | POA: Diagnosis not present

## 2016-04-25 DIAGNOSIS — Z23 Encounter for immunization: Secondary | ICD-10-CM | POA: Diagnosis not present

## 2016-04-25 DIAGNOSIS — I2510 Atherosclerotic heart disease of native coronary artery without angina pectoris: Secondary | ICD-10-CM | POA: Diagnosis not present

## 2016-04-25 DIAGNOSIS — E118 Type 2 diabetes mellitus with unspecified complications: Secondary | ICD-10-CM | POA: Diagnosis not present

## 2016-04-25 DIAGNOSIS — L309 Dermatitis, unspecified: Secondary | ICD-10-CM | POA: Diagnosis not present

## 2016-05-01 DIAGNOSIS — E119 Type 2 diabetes mellitus without complications: Secondary | ICD-10-CM | POA: Diagnosis not present

## 2016-05-02 ENCOUNTER — Emergency Department (HOSPITAL_COMMUNITY): Payer: Commercial Managed Care - HMO

## 2016-05-02 ENCOUNTER — Encounter (HOSPITAL_COMMUNITY): Payer: Self-pay

## 2016-05-02 ENCOUNTER — Emergency Department (HOSPITAL_COMMUNITY)
Admission: EM | Admit: 2016-05-02 | Discharge: 2016-05-02 | Disposition: A | Discharge status: Home / Self Care | Payer: Commercial Managed Care - HMO | Attending: Emergency Medicine | Admitting: Emergency Medicine

## 2016-05-02 DIAGNOSIS — I252 Old myocardial infarction: Secondary | ICD-10-CM | POA: Insufficient documentation

## 2016-05-02 DIAGNOSIS — Z7984 Long term (current) use of oral hypoglycemic drugs: Secondary | ICD-10-CM | POA: Diagnosis not present

## 2016-05-02 DIAGNOSIS — I2510 Atherosclerotic heart disease of native coronary artery without angina pectoris: Secondary | ICD-10-CM | POA: Diagnosis not present

## 2016-05-02 DIAGNOSIS — R072 Precordial pain: Secondary | ICD-10-CM | POA: Diagnosis not present

## 2016-05-02 DIAGNOSIS — R55 Syncope and collapse: Secondary | ICD-10-CM | POA: Diagnosis not present

## 2016-05-02 DIAGNOSIS — E119 Type 2 diabetes mellitus without complications: Secondary | ICD-10-CM | POA: Insufficient documentation

## 2016-05-02 DIAGNOSIS — I10 Essential (primary) hypertension: Secondary | ICD-10-CM | POA: Diagnosis not present

## 2016-05-02 DIAGNOSIS — R079 Chest pain, unspecified: Secondary | ICD-10-CM | POA: Diagnosis not present

## 2016-05-02 DIAGNOSIS — Z79899 Other long term (current) drug therapy: Secondary | ICD-10-CM | POA: Insufficient documentation

## 2016-05-02 DIAGNOSIS — R404 Transient alteration of awareness: Secondary | ICD-10-CM | POA: Diagnosis not present

## 2016-05-02 DIAGNOSIS — R0781 Pleurodynia: Secondary | ICD-10-CM | POA: Diagnosis not present

## 2016-05-02 LAB — CBC WITH DIFFERENTIAL/PLATELET
BASOS ABS: 0 10*3/uL (ref 0.0–0.1)
BASOS PCT: 0 %
EOS ABS: 0.2 10*3/uL (ref 0.0–0.7)
EOS PCT: 1 %
HEMATOCRIT: 44.4 % (ref 39.0–52.0)
Hemoglobin: 15 g/dL (ref 13.0–17.0)
Lymphocytes Relative: 13 %
Lymphs Abs: 1.8 10*3/uL (ref 0.7–4.0)
MCH: 29.1 pg (ref 26.0–34.0)
MCHC: 33.8 g/dL (ref 30.0–36.0)
MCV: 86.2 fL (ref 78.0–100.0)
MONO ABS: 0.7 10*3/uL (ref 0.1–1.0)
MONOS PCT: 5 %
NEUTROS ABS: 11 10*3/uL — AB (ref 1.7–7.7)
Neutrophils Relative %: 81 %
PLATELETS: 228 10*3/uL (ref 150–400)
RBC: 5.15 MIL/uL (ref 4.22–5.81)
RDW: 14.9 % (ref 11.5–15.5)
WBC: 13.7 10*3/uL — ABNORMAL HIGH (ref 4.0–10.5)

## 2016-05-02 LAB — COMPREHENSIVE METABOLIC PANEL
ALBUMIN: 3.8 g/dL (ref 3.5–5.0)
ALT: 24 U/L (ref 17–63)
ANION GAP: 8 (ref 5–15)
AST: 25 U/L (ref 15–41)
Alkaline Phosphatase: 64 U/L (ref 38–126)
BILIRUBIN TOTAL: 0.3 mg/dL (ref 0.3–1.2)
BUN: 21 mg/dL — AB (ref 6–20)
CHLORIDE: 103 mmol/L (ref 101–111)
CO2: 26 mmol/L (ref 22–32)
Calcium: 9 mg/dL (ref 8.9–10.3)
Creatinine, Ser: 1.05 mg/dL (ref 0.61–1.24)
GFR calc Af Amer: >60 mL/min (ref >60)
GFR calc non Af Amer: >60 mL/min (ref >60)
GLUCOSE: 151 mg/dL — AB (ref 65–99)
POTASSIUM: 4.8 mmol/L (ref 3.5–5.1)
Sodium: 137 mmol/L (ref 135–145)
TOTAL PROTEIN: 7.1 g/dL (ref 6.5–8.1)

## 2016-05-02 LAB — TROPONIN I

## 2016-05-02 IMAGING — DX DG CHEST 2V
2 series · 2 of 2 positions shown · non-contrast
Comparison: [DATE]

CLINICAL DATA: Syncope and rib pain.

EXAM:
CHEST  2 VIEW

[chest lat]
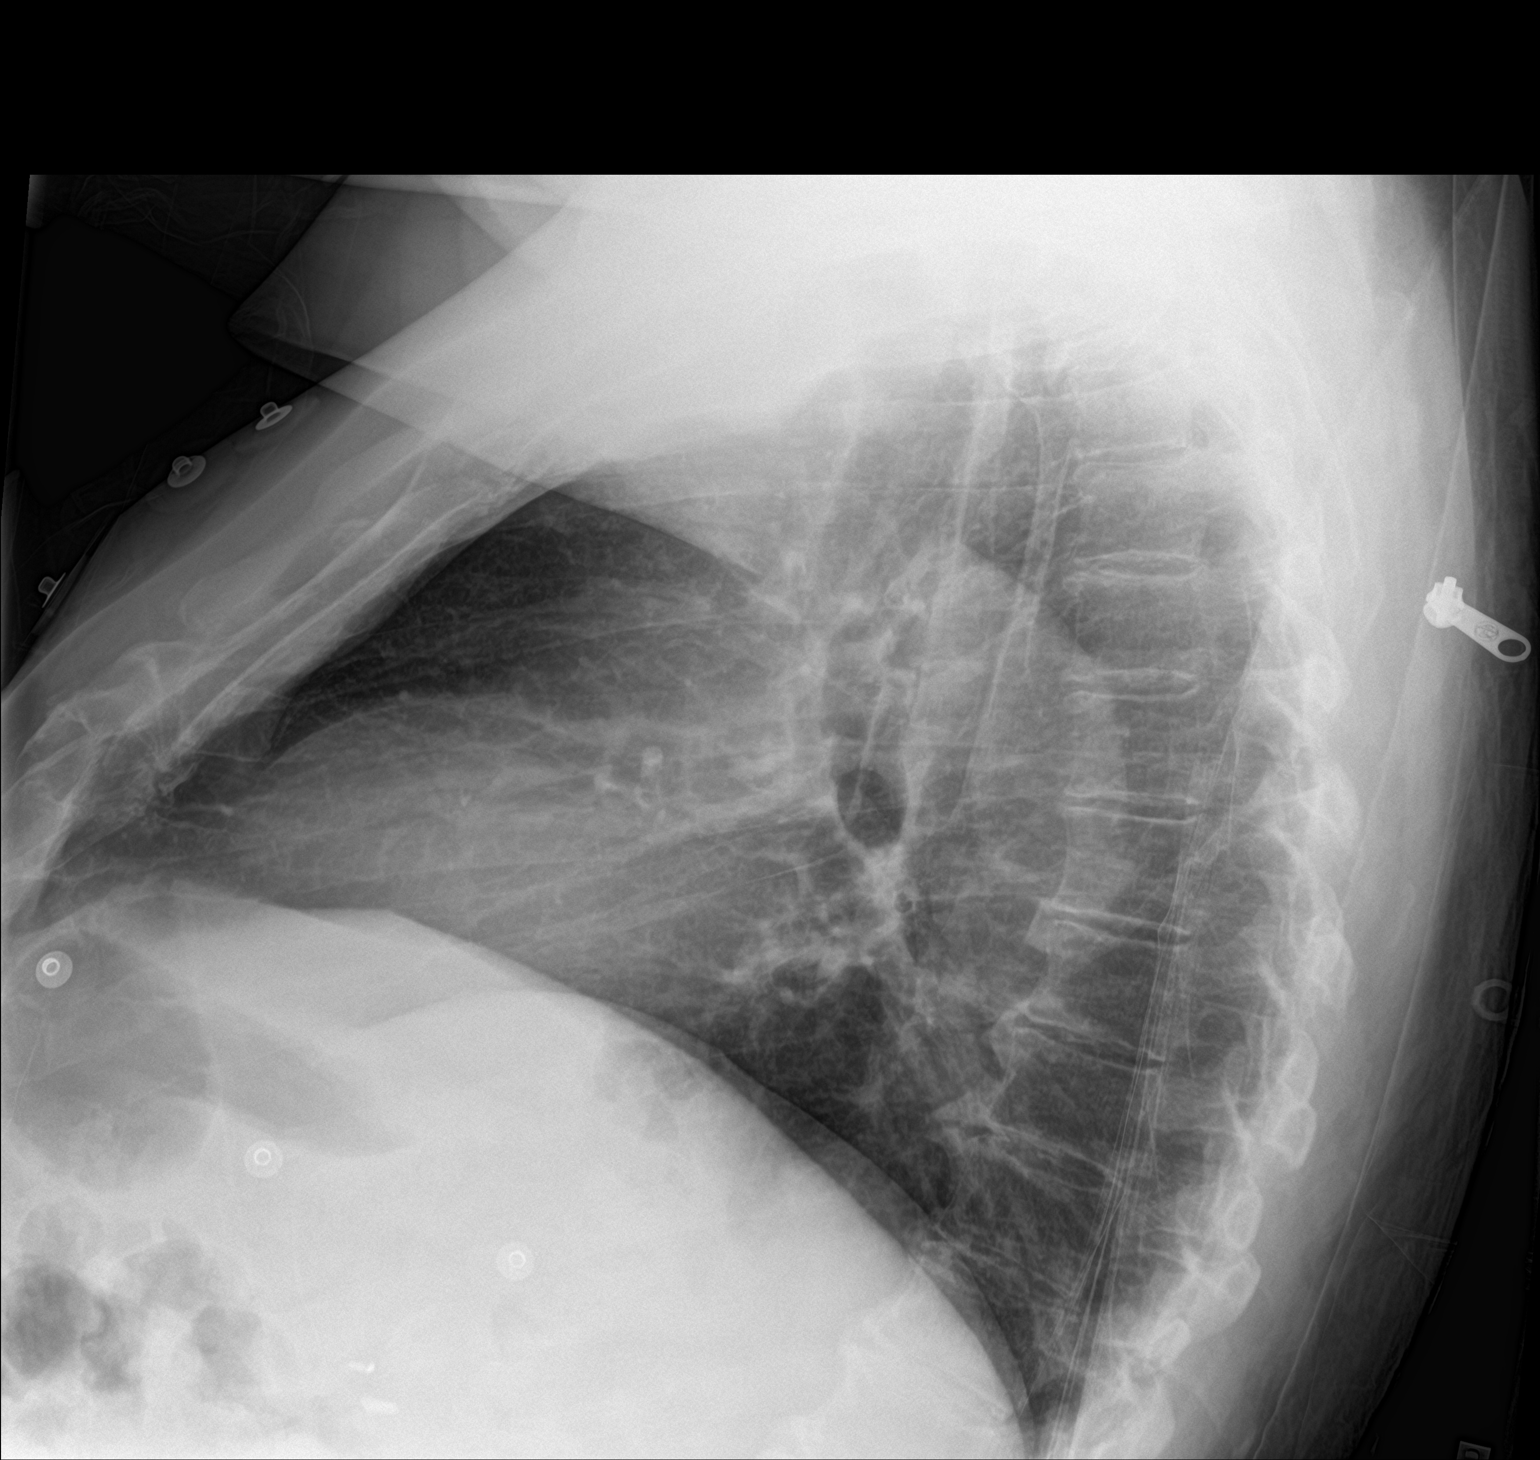

[chest ap]
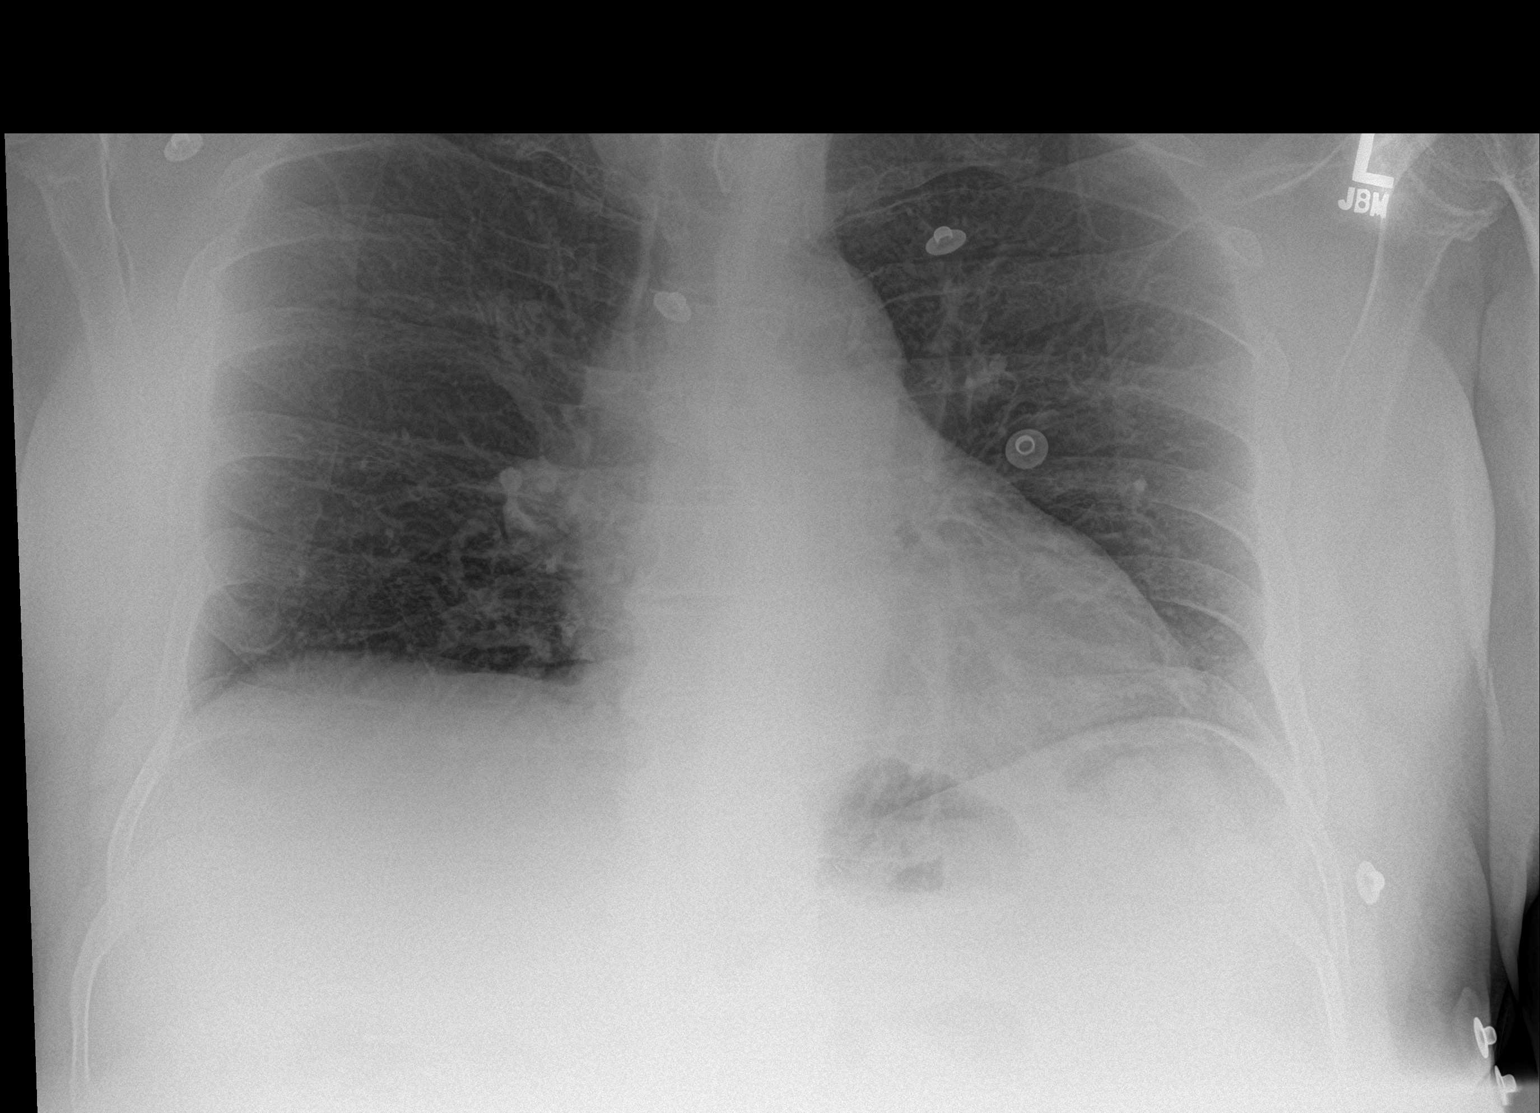

[2 of 2 positions shown; findings below may reference images not displayed]

FINDINGS: The heart size and mediastinal contours are within normal limits.
Both lungs are clear. The visualized skeletal structures are
unremarkable.
IMPRESSION: No active cardiopulmonary disease.

## 2016-05-02 NOTE — ED Provider Notes (Signed)
[facility]-EMERGENCY DEPT Provider Note   CSN: [id number] Arrival date & time: 05/02/16  1525     History   Chief Complaint Chief Complaint  Patient presents with  . Chest Pain    HPI William Hunter is a 70 y.o. male.  HPI   Patient is a 70 year old male with a history of coronary artery disease, MI, rheumatoid arthritis, type 2 diabetes who presents the emergency department after a syncopal episode that occurred roughly 30 minutes PTA. Wife and son are also historians. Patient was standing in line at [employer] for roughly 25 minutes and felt very warm. Son states the patient was taking his coat off and he noticed the patient to be cold, clammy, sweaty, stating he did not feel well. Son states patient passed out and he helped patient to the ground. LOC roughly 45 seconds. Associated lightheadedness. Patient states he had left-sided lower rib pain at the time of the sig one episode but this is resolved. He denies hitting his head. Family denies confusion, slurred speech, facial droop. Patient denies chest pain, shortness of breath, abdominal pain, nausea, vomiting, numbness/weakness, blood in the stool, changes in bowels, recent change in medication, recent travel, pain or swelling in calves, hx of DVT or PE, ETOH or drug use.  Review of EMR pt saw his cardiologist on 04/09/16 and no changes to medications were made. F/u in 1 yr.  Past Medical History:  Diagnosis Date  . Anginal pain ([name]   . Arthritis    "fingers" (03/09/2012)  . Cold feet    "from my diabetes; they stay cold" (03/09/2012)  . Coronary artery disease   . Coronary artery spasm [facility])    since age 4 Dr Aleen Campi  . Dyslipidemia   . Headache(784.0)   . Hx of gallstones    Dr Derrell Lolling lap cholecystectomy  . Hx of plastic surgery    plastic surgery following a fall off a loading dock ,lost  all his upper teeth   . Hypertension   . MI, old    at age 89  . Migraines   . Pneumonia ~ 1962; 1970's   "double"; "regular" (03/09/2012)    . Rheumatoid arthritis ([name]    Dr Dierdre Forth   . Slipped cervical disc 2008   Dr Patsi Sears in the past/ Dr Larita Fife , chiropractor in 2008  . Thyroid disease    Dr Lucianne Muss  . Type II diabetes mellitus [facility])     Patient Active Problem List   Diagnosis Date Noted  . Coronary vasospasm ([name] 04/06/2014  . Gallstones 03/10/2012  . Abdominal pain 03/09/2012  . MI, old   . Coronary artery disease   . Hypertension   . Dyslipidemia     Past Surgical History:  Procedure Laterality Date  . CARDIAC CATHETERIZATION     x 3 with NO stents   . CHOLECYSTECTOMY  03/11/2012   Procedure: LAPAROSCOPIC CHOLECYSTECTOMY WITH INTRAOPERATIVE CHOLANGIOGRAM;  Surgeon: Ernestene Mention, MD;  Location: [facility] OR;  Service: General;  Laterality: N/A;  . PENILE PROSTHESIS IMPLANT  1983       Home Medications    Prior to Admission medications   Medication Sig Start Date End Date Taking? Authorizing Provider  acetaminophen (TYLENOL) 500 MG tablet Take 1,500 mg by mouth 3 (three) times daily as needed for headache (pain).   Yes Historical Provider, MD  atorvastatin (LIPITOR) 40 MG tablet Take 1 tablet (40 mg total) by mouth daily. 07/19/13  Yes Jake Bathe, MD  benazepril (LOTENSIN)  10 MG tablet Take 10 mg by mouth daily.  05/11/15  Yes Historical Provider, MD  diltiazem (TAZTIA XT) 300 MG 24 hr capsule Take 1 capsule (300 mg total) by mouth daily. 04/27/15  Yes Jake Bathe, MD  glimepiride (AMARYL) 4 MG tablet Take 4 mg by mouth daily before breakfast.   Yes Historical Provider, MD  metFORMIN (GLUCOPHAGE) 500 MG tablet Take 500 mg by mouth 2 (two) times daily with a meal.  04/06/16  Yes Historical Provider, MD  Multiple Vitamin (MULTIVITAMIN WITH MINERALS) TABS tablet Take 1 tablet by mouth daily.   Yes Historical Provider, MD  PRESCRIPTION MEDICATION Take 2 tablets by mouth daily with supper. Double blind diabetes study through [facility] (Dr. Juleen China).   Yes Historical Provider, MD    Family  History Family History  Problem Relation Age of Onset  . Heart failure Mother   . Cancer - Other Mother     breast  . Heart disease Father   . Cancer - Other Sister     tounge cancer    Social History Social History  Substance Use Topics  . Smoking status: Never Smoker  . Smokeless tobacco: Never Used  . Alcohol use No     Allergies   Codeine; Penicillins; Wool alcohol [lanolin]; and Prednisone   Review of Systems Review of Systems  Constitutional: Positive for chills and diaphoresis.  Eyes: Negative for visual disturbance.  Respiratory: Negative for cough, chest tightness and shortness of breath.   Cardiovascular: Positive for chest pain (left sided rib pain).  Gastrointestinal: Negative for abdominal pain.  Skin: Negative for color change and rash.  Neurological: Positive for syncope and headaches.  Psychiatric/Behavioral: Negative for confusion.  All other systems reviewed and are negative.    Physical Exam Updated Vital Signs BP 102/57 (BP Location: Right Arm)   Pulse 64   Temp 98.5 F (36.9 C) (Oral)   Resp 13   Ht 6' (1.829 m)   Wt 94.3 kg   SpO2 97%   BMI 28.21 kg/m   Physical Exam  Constitutional: He appears well-developed and well-nourished. No distress.  HENT:  Head: Normocephalic and atraumatic.  Eyes: Conjunctivae are normal.  Neck: Normal range of motion.  Cardiovascular: Normal rate, regular rhythm, normal heart sounds and intact distal pulses.  Exam reveals no gallop and no friction rub.   No murmur heard. Pulses:      Radial pulses are 2+ on the right side, and 2+ on the left side.       Posterior tibial pulses are 2+ on the right side, and 2+ on the left side.  Pulmonary/Chest: Effort normal and breath sounds normal. No respiratory distress. He has no wheezes. He has no rales. He exhibits tenderness (left lower ribs).  Abdominal: Soft. Bowel sounds are normal. He exhibits no distension. There is no tenderness. There is no guarding.   Musculoskeletal: Normal range of motion. He exhibits no edema.  Neurological: He is alert. He has normal strength. No sensory deficit. Coordination normal. GCS eye subscore is 4. GCS verbal subscore is 5. GCS motor subscore is 6.  Cranial nerves grossly intact  Skin: Skin is warm and dry. He is not diaphoretic.  Psychiatric: He has a normal mood and affect. His behavior is normal.  Nursing note and vitals reviewed.    ED Treatments / Results  Labs (all labs ordered are listed, but only abnormal results are displayed) Labs Reviewed  CBC WITH DIFFERENTIAL/PLATELET - Abnormal; Notable for the following:  Result Value   WBC 13.7 (*)    Neutro Abs 11.0 (*)    All other components within normal limits  COMPREHENSIVE METABOLIC PANEL - Abnormal; Notable for the following:    Glucose, Bld 151 (*)    BUN 21 (*)    All other components within normal limits  TROPONIN I    EKG  EKG Interpretation  Date/Time:  Thursday May 02 2016 15:38:47 EST Ventricular Rate:  63 PR Interval:    QRS Duration: 89 QT Interval:  392 QTC Calculation: 402 R Axis:   51 Text Interpretation:  Sinus rhythm No significant change since last tracing Confirmed by [facility] MD, PEDRO ([id number]) on 05/02/2016 6:01:27 PM       Radiology Dg Chest 2 View  Result Date: 05/02/2016 CLINICAL DATA:  Syncope and rib pain. EXAM: CHEST  2 VIEW COMPARISON:  June 14, 2015 FINDINGS: The heart size and mediastinal contours are within normal limits. Both lungs are clear. The visualized skeletal structures are unremarkable. IMPRESSION: No active cardiopulmonary disease. Electronically Signed   By: Gerome Sam III M.D   On: 05/02/2016 16:18    Procedures Procedures (including critical care time)  Medications Ordered in ED Medications - No data to display   Initial Impression / Assessment and Plan / ED Course  I have reviewed the triage vital signs and the nursing notes.  Pertinent labs & imaging results that  were available during my care of the patient were reviewed by me and considered in my medical decision making (see chart for details).  Clinical Course     Patient is to be discharged with recommendation to follow up with PCP in regards to today's hospital visit. Chest pain and syncope is not likely of cardiac or pulmonary etiology d/t presentation, VSS, no tracheal deviation, no JVD or new murmur, RRR, breath sounds equal bilaterally, EKG without acute abnormalities, negative troponin, and negative CXR. Unable to perc due to age. Nonspecific leukocytosis. Etiology is likely vasovagal syncope. Pt was standing in a crowding line for an extended period of time and felt the sensation of syncope coming on.  Pt has been advised to return to the ED if CP becomes exertional, associated with diaphoresis or nausea, radiates to left jaw/arm, worsens or becomes concerning in any way. Instructed pt to f/u with his cardiologist tomorrow for an appointment for next week. Discussed strict return precautions to the ED. I discussed all of the results with the patient and family members they have expressed their understanding to the verbal discharge instructions.  Pt appears reliable for follow up and is agreeable to discharge.   Case has been discussed with and seen by Dr. Eudelia Bunch who agrees with the above plan to discharge.    Final Clinical Impressions(s) / ED Diagnoses   Final diagnoses:  Nonspecific chest pain  Syncope, unspecified syncope type    New Prescriptions Discharge Medication List as of 05/02/2016  6:46 PM       Jerre Simon, PA 05/02/16 1952    Nira Conn, MD 05/03/16 0111

## 2016-05-02 NOTE — ED Notes (Signed)
Patient transported to X-ray 

## 2016-05-02 NOTE — ED Notes (Signed)
Pt stable, understands discharge instructions, and reasons for return.   

## 2016-05-02 NOTE — ED Triage Notes (Addendum)
Pt arrives EMS from [employer] where he had a syncopal episode after getting "hot".  HX of MI 32 years ago. Given nitro x1 that took bp from `120/p to 90/p. Given aspirin 324 by EMS. Pain started at 4/10 Nitro went to 2/10. Now 0.5/10

## 2016-05-02 NOTE — Discharge Instructions (Signed)
Follow-up with your cardiologist tomorrow morning to be seen as soon as possible. Return to emergency department if you experience another episode of syncope, chest pain associated with sweating, pain in your left arm or jaw, nausea, vomiting, back pain, headache, visual changes, weakness, numbness or any other concerning symptoms.

## 2016-05-15 DIAGNOSIS — Z23 Encounter for immunization: Secondary | ICD-10-CM | POA: Diagnosis not present

## 2016-05-15 DIAGNOSIS — E789 Disorder of lipoprotein metabolism, unspecified: Secondary | ICD-10-CM | POA: Diagnosis not present

## 2016-05-15 DIAGNOSIS — E118 Type 2 diabetes mellitus with unspecified complications: Secondary | ICD-10-CM | POA: Diagnosis not present

## 2016-05-15 DIAGNOSIS — R55 Syncope and collapse: Secondary | ICD-10-CM | POA: Diagnosis not present

## 2016-05-16 DIAGNOSIS — M79641 Pain in right hand: Secondary | ICD-10-CM | POA: Diagnosis not present

## 2016-05-16 DIAGNOSIS — M150 Primary generalized (osteo)arthritis: Secondary | ICD-10-CM | POA: Diagnosis not present

## 2016-05-16 DIAGNOSIS — M19042 Primary osteoarthritis, left hand: Secondary | ICD-10-CM | POA: Diagnosis not present

## 2016-05-16 DIAGNOSIS — M19041 Primary osteoarthritis, right hand: Secondary | ICD-10-CM | POA: Diagnosis not present

## 2016-05-16 DIAGNOSIS — M0589 Other rheumatoid arthritis with rheumatoid factor of multiple sites: Secondary | ICD-10-CM | POA: Diagnosis not present

## 2016-05-23 DIAGNOSIS — E118 Type 2 diabetes mellitus with unspecified complications: Secondary | ICD-10-CM | POA: Diagnosis not present

## 2016-05-23 DIAGNOSIS — E789 Disorder of lipoprotein metabolism, unspecified: Secondary | ICD-10-CM | POA: Diagnosis not present

## 2016-05-30 DIAGNOSIS — I10 Essential (primary) hypertension: Secondary | ICD-10-CM | POA: Diagnosis not present

## 2016-05-30 DIAGNOSIS — E118 Type 2 diabetes mellitus with unspecified complications: Secondary | ICD-10-CM | POA: Diagnosis not present

## 2016-05-31 ENCOUNTER — Other Ambulatory Visit: Payer: Self-pay | Admitting: Endocrinology

## 2016-05-31 DIAGNOSIS — R519 Headache, unspecified: Secondary | ICD-10-CM

## 2016-05-31 DIAGNOSIS — R55 Syncope and collapse: Secondary | ICD-10-CM

## 2016-05-31 DIAGNOSIS — R51 Headache: Secondary | ICD-10-CM

## 2016-06-07 ENCOUNTER — Ambulatory Visit
Admission: RE | Admit: 2016-06-07 | Discharge: 2016-06-07 | Disposition: A | Discharge status: Home / Self Care | Payer: Commercial Managed Care - HMO | Source: Ambulatory Visit | Attending: Endocrinology | Admitting: Endocrinology

## 2016-06-07 DIAGNOSIS — R51 Headache: Secondary | ICD-10-CM

## 2016-06-07 DIAGNOSIS — R55 Syncope and collapse: Secondary | ICD-10-CM

## 2016-06-07 DIAGNOSIS — G8929 Other chronic pain: Secondary | ICD-10-CM

## 2016-06-07 IMAGING — CT CT HEAD WO/W CM
3 of 4 series · 16 of 47 positions shown, 19 images · IV contrast (agent unspecified)
Comparison: The CT head [DATE]

CLINICAL DATA: Syncope.  Chronic intractable headache

EXAM:
CT HEAD WITHOUT AND WITH CONTRAST
TECHNIQUE: Contiguous axial images were obtained from the base of the skull
through the vertex without and with intravenous contrast
CONTRAST:  75 mL [YR] IV

[Series 2: head w/(date) · axial · 0.43mm/px · z∈[-75,+65]mm · 10 of 34 slices shown, 13 images]
[im 3/34  brain]
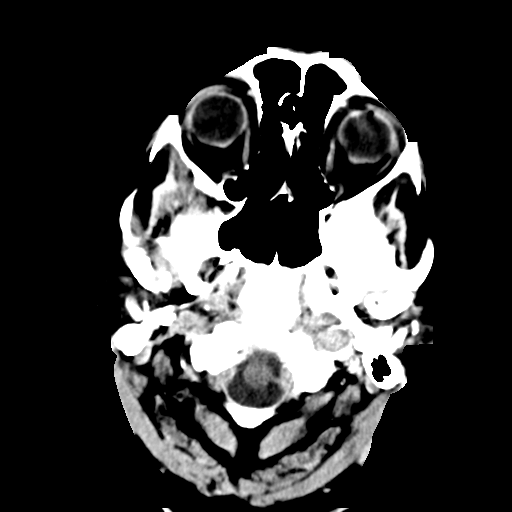
[im 3/34  bone]
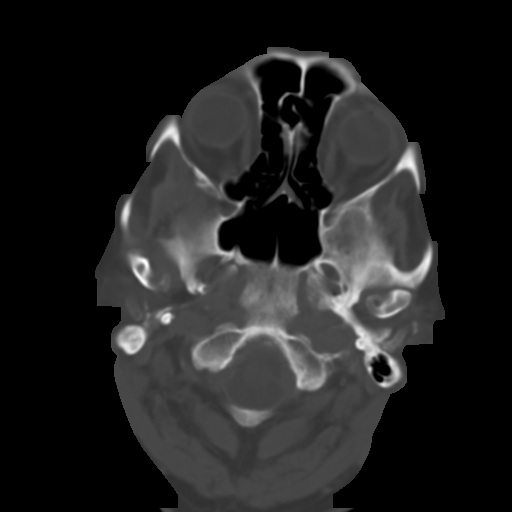
[im 5/34  brain]
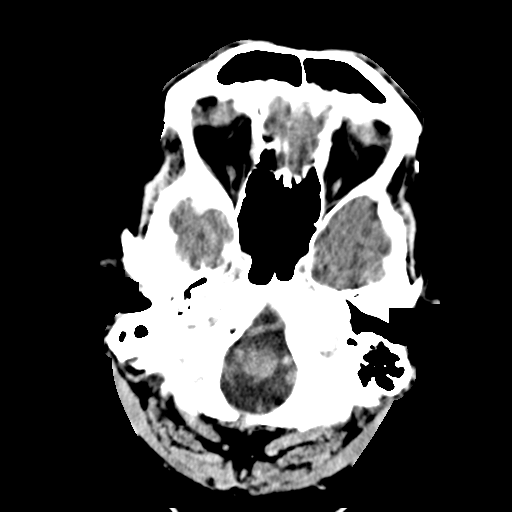
[im 10/34  brain]
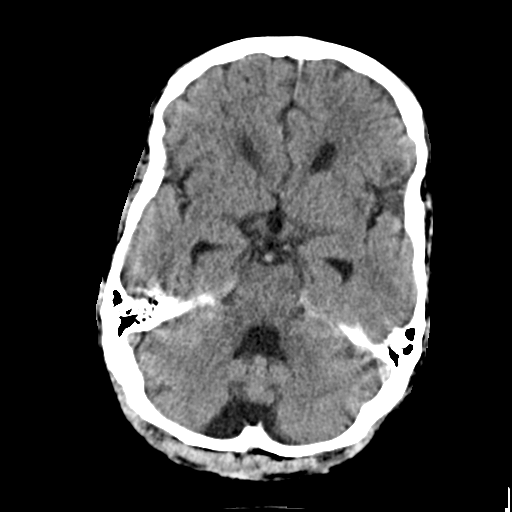
[im 12/34  brain]
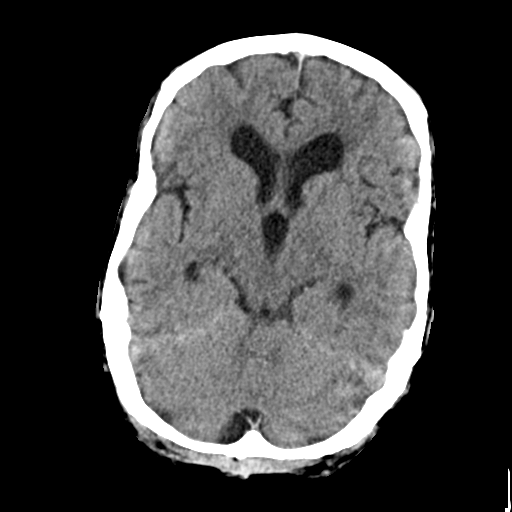
[im 15/34  brain]
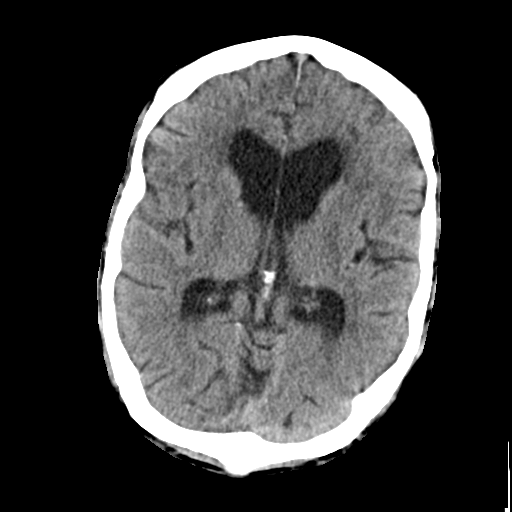
[im 15/34  bone]
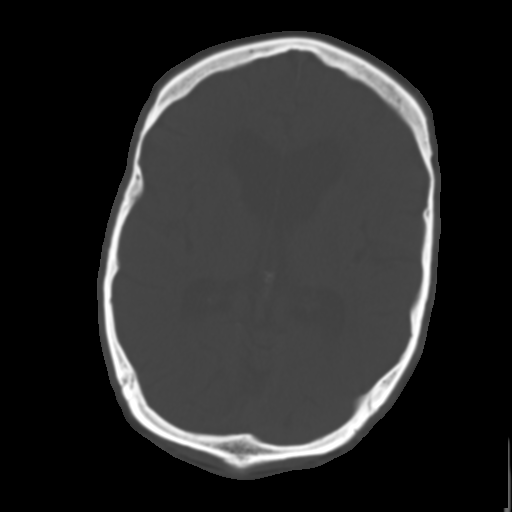
[im 19/34  brain]
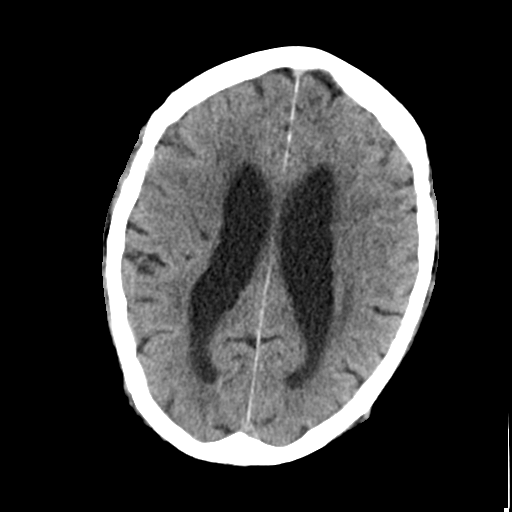
[im 22/34  brain]
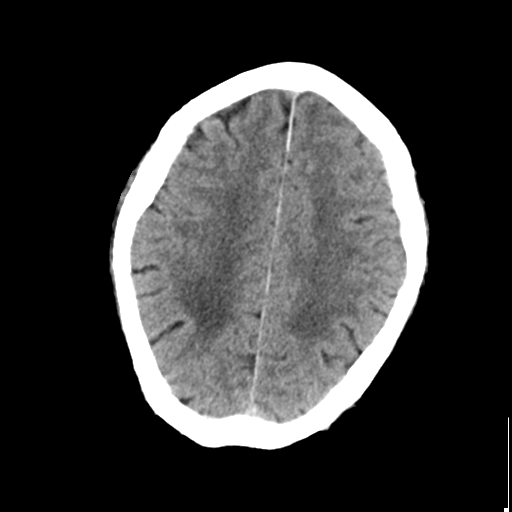
[im 24/34  brain]
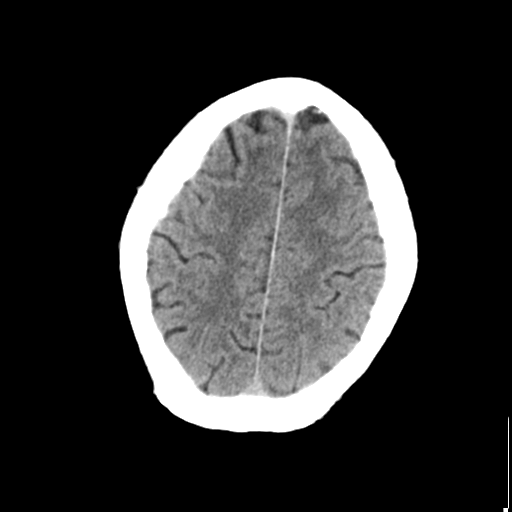
[im 29/34  brain]
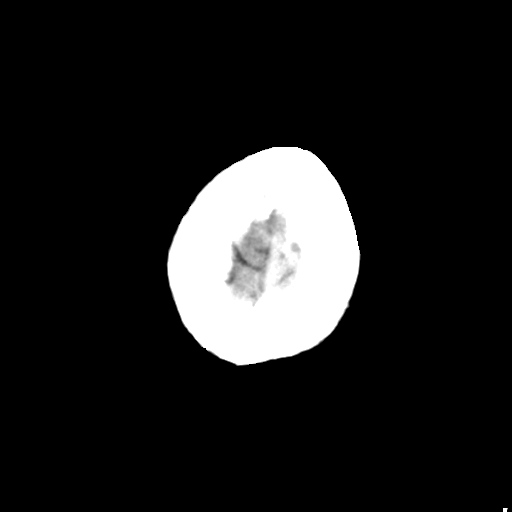
[im 29/34  bone]
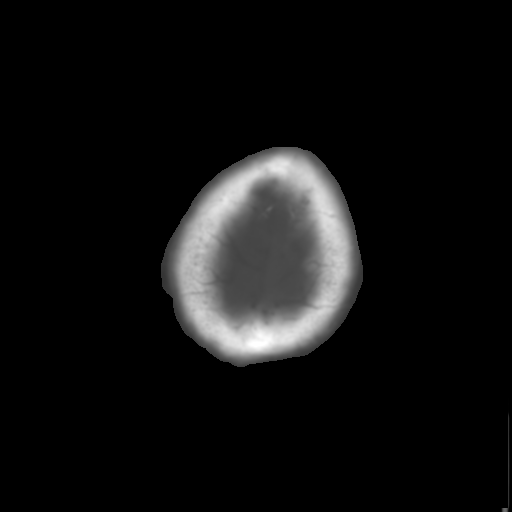
[im 31/34  brain]
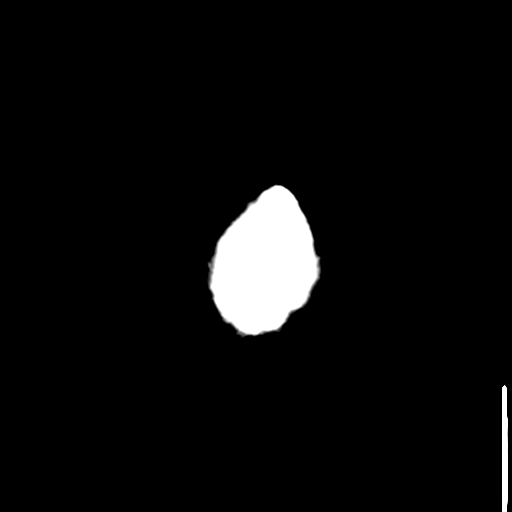

[Series 5: cor cor · coronal · 0.31mm/px · 3 of 65 slices shown]
[im 22/65  brain]
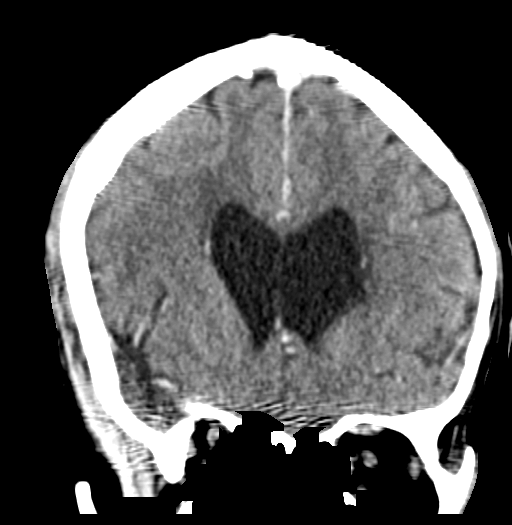
[im 29/65  brain]
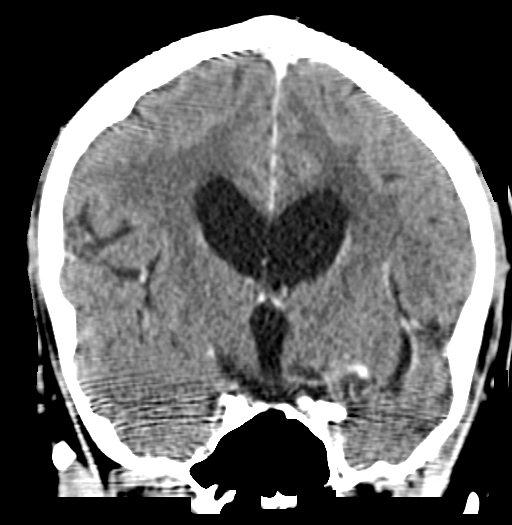
[im 36/65  brain]
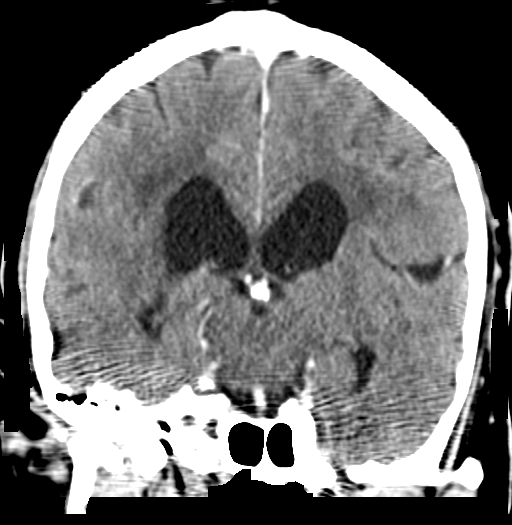

[Series 6: sag sag · sagittal · 0.34mm/px · 3 of 56 slices shown]
[im 19/56  brain]
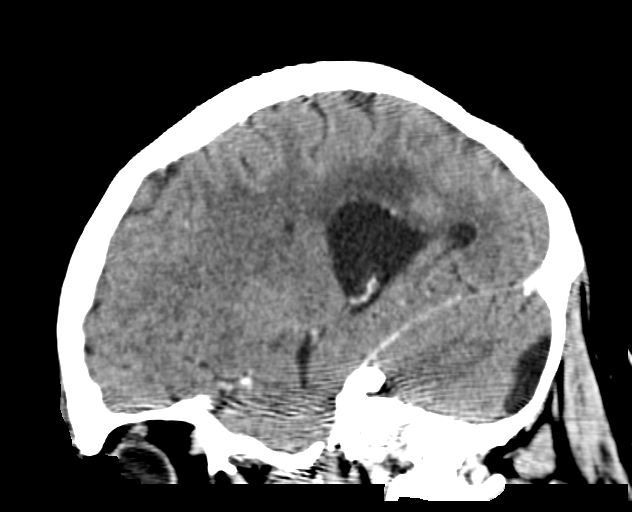
[im 28/56  brain]
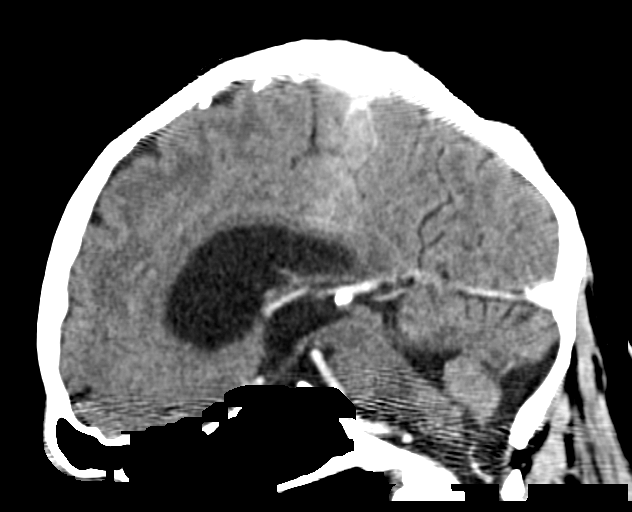
[im 37/56  brain]
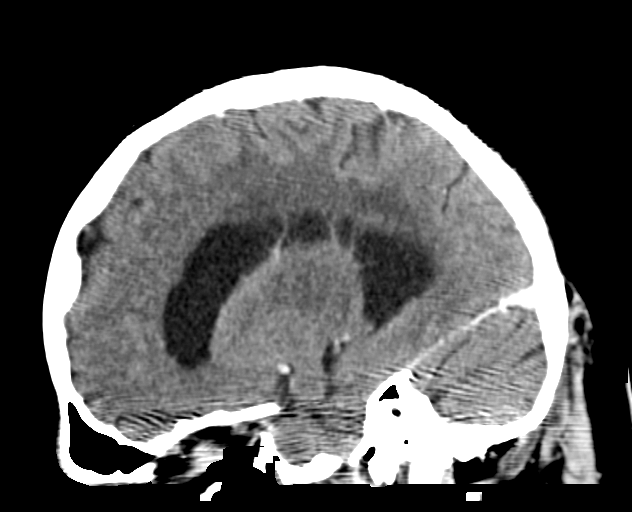

[16 of 47 positions shown; findings below may reference images not displayed]

FINDINGS: Brain: Generalized atrophy of a moderate degree has progressed in
the interval. Ventricular enlargement has progressed and may
represent hydrocephalus versus central atrophy. Ventricular
enlargement is greater than expected for the level of atrophy.

Hypodensity throughout the cerebral white matter bilaterally. No
acute infarct. Negative for hemorrhage or mass

Postcontrast imaging demonstrates normal enhancement. No enhancing
mass lesion

Vascular: Normal arterial and venous enhancement.

Skull: Negative

Sinuses/Orbits: Negative

Other: None
IMPRESSION: Moderate ventricular enlargement with significant progression since
[YR]. There is also progression of atrophy. Ventricular enlargement
may be due to atrophy however consider NPH.

Chronic microvascular ischemic change in the white matter. No acute
infarct or mass.

## 2016-06-07 MED ORDER — IOPAMIDOL (ISOVUE-300) INJECTION 61%: 75.0000 mL | Freq: Once | INTRAVENOUS | Status: DC | PRN

## 2016-06-12 DIAGNOSIS — M150 Primary generalized (osteo)arthritis: Secondary | ICD-10-CM | POA: Diagnosis not present

## 2016-06-12 DIAGNOSIS — Z79899 Other long term (current) drug therapy: Secondary | ICD-10-CM | POA: Diagnosis not present

## 2016-06-12 DIAGNOSIS — M0589 Other rheumatoid arthritis with rheumatoid factor of multiple sites: Secondary | ICD-10-CM | POA: Diagnosis not present

## 2016-06-20 DIAGNOSIS — M5033 Other cervical disc degeneration, cervicothoracic region: Secondary | ICD-10-CM | POA: Diagnosis not present

## 2016-06-20 DIAGNOSIS — M9901 Segmental and somatic dysfunction of cervical region: Secondary | ICD-10-CM | POA: Diagnosis not present

## 2016-07-03 ENCOUNTER — Other Ambulatory Visit: Payer: Self-pay | Admitting: Endocrinology

## 2016-07-03 DIAGNOSIS — R011 Cardiac murmur, unspecified: Secondary | ICD-10-CM

## 2016-07-03 DIAGNOSIS — I517 Cardiomegaly: Secondary | ICD-10-CM

## 2016-07-03 DIAGNOSIS — R55 Syncope and collapse: Secondary | ICD-10-CM

## 2016-07-15 ENCOUNTER — Ambulatory Visit (HOSPITAL_COMMUNITY): Payer: Medicare HMO | Attending: Cardiology

## 2016-07-15 ENCOUNTER — Other Ambulatory Visit: Payer: Self-pay

## 2016-07-15 DIAGNOSIS — E119 Type 2 diabetes mellitus without complications: Secondary | ICD-10-CM | POA: Diagnosis not present

## 2016-07-15 DIAGNOSIS — I252 Old myocardial infarction: Secondary | ICD-10-CM | POA: Diagnosis not present

## 2016-07-15 DIAGNOSIS — I2510 Atherosclerotic heart disease of native coronary artery without angina pectoris: Secondary | ICD-10-CM | POA: Diagnosis not present

## 2016-07-15 DIAGNOSIS — I517 Cardiomegaly: Secondary | ICD-10-CM

## 2016-07-15 DIAGNOSIS — I119 Hypertensive heart disease without heart failure: Secondary | ICD-10-CM | POA: Diagnosis not present

## 2016-07-15 DIAGNOSIS — E785 Hyperlipidemia, unspecified: Secondary | ICD-10-CM | POA: Insufficient documentation

## 2016-07-15 DIAGNOSIS — I358 Other nonrheumatic aortic valve disorders: Secondary | ICD-10-CM | POA: Diagnosis not present

## 2016-07-15 DIAGNOSIS — R55 Syncope and collapse: Secondary | ICD-10-CM | POA: Diagnosis not present

## 2016-07-15 DIAGNOSIS — Z8249 Family history of ischemic heart disease and other diseases of the circulatory system: Secondary | ICD-10-CM | POA: Diagnosis not present

## 2016-07-15 DIAGNOSIS — R011 Cardiac murmur, unspecified: Secondary | ICD-10-CM | POA: Diagnosis not present

## 2016-07-18 DIAGNOSIS — E118 Type 2 diabetes mellitus with unspecified complications: Secondary | ICD-10-CM | POA: Diagnosis not present

## 2016-07-25 DIAGNOSIS — E789 Disorder of lipoprotein metabolism, unspecified: Secondary | ICD-10-CM | POA: Diagnosis not present

## 2016-07-25 DIAGNOSIS — E118 Type 2 diabetes mellitus with unspecified complications: Secondary | ICD-10-CM | POA: Diagnosis not present

## 2016-07-25 DIAGNOSIS — R51 Headache: Secondary | ICD-10-CM | POA: Diagnosis not present

## 2016-07-29 ENCOUNTER — Telehealth: Payer: Self-pay | Admitting: Cardiology

## 2016-08-05 DIAGNOSIS — M5033 Other cervical disc degeneration, cervicothoracic region: Secondary | ICD-10-CM | POA: Diagnosis not present

## 2016-08-05 DIAGNOSIS — M9902 Segmental and somatic dysfunction of thoracic region: Secondary | ICD-10-CM | POA: Diagnosis not present

## 2016-08-05 DIAGNOSIS — M5134 Other intervertebral disc degeneration, thoracic region: Secondary | ICD-10-CM | POA: Diagnosis not present

## 2016-08-05 DIAGNOSIS — M9901 Segmental and somatic dysfunction of cervical region: Secondary | ICD-10-CM | POA: Diagnosis not present

## 2016-08-12 DIAGNOSIS — Z79899 Other long term (current) drug therapy: Secondary | ICD-10-CM | POA: Diagnosis not present

## 2016-08-12 DIAGNOSIS — M150 Primary generalized (osteo)arthritis: Secondary | ICD-10-CM | POA: Diagnosis not present

## 2016-08-12 DIAGNOSIS — M0589 Other rheumatoid arthritis with rheumatoid factor of multiple sites: Secondary | ICD-10-CM | POA: Diagnosis not present

## 2016-09-02 ENCOUNTER — Ambulatory Visit (INDEPENDENT_AMBULATORY_CARE_PROVIDER_SITE_OTHER): Payer: Medicare HMO | Admitting: Diagnostic Neuroimaging

## 2016-09-02 ENCOUNTER — Encounter (INDEPENDENT_AMBULATORY_CARE_PROVIDER_SITE_OTHER): Payer: Self-pay

## 2016-09-02 ENCOUNTER — Encounter: Payer: Self-pay | Admitting: Diagnostic Neuroimaging

## 2016-09-02 VITALS — BP 132/64 | HR 86 | Ht 71.0 in | Wt 209.6 lb

## 2016-09-02 DIAGNOSIS — M069 Rheumatoid arthritis, unspecified: Secondary | ICD-10-CM | POA: Diagnosis not present

## 2016-09-02 DIAGNOSIS — R55 Syncope and collapse: Secondary | ICD-10-CM

## 2016-09-02 DIAGNOSIS — R930 Abnormal findings on diagnostic imaging of skull and head, not elsewhere classified: Secondary | ICD-10-CM

## 2016-09-02 DIAGNOSIS — R51 Headache: Secondary | ICD-10-CM | POA: Diagnosis not present

## 2016-09-02 DIAGNOSIS — E1142 Type 2 diabetes mellitus with diabetic polyneuropathy: Secondary | ICD-10-CM

## 2016-09-02 DIAGNOSIS — G4719 Other hypersomnia: Secondary | ICD-10-CM

## 2016-09-02 DIAGNOSIS — R519 Headache, unspecified: Secondary | ICD-10-CM

## 2016-09-02 DIAGNOSIS — G43109 Migraine with aura, not intractable, without status migrainosus: Secondary | ICD-10-CM | POA: Diagnosis not present

## 2016-09-02 DIAGNOSIS — R0683 Snoring: Secondary | ICD-10-CM | POA: Diagnosis not present

## 2016-09-02 MED ORDER — RIZATRIPTAN BENZOATE 10 MG PO TBDP
10.0000 mg | ORAL_TABLET | ORAL | 11 refills | Status: DC | PRN
Start: 2016-09-02 — End: 2016-10-18

## 2016-09-02 MED ORDER — TOPIRAMATE 50 MG PO TABS: 50.0000 mg | ORAL_TABLET | Freq: Two times a day (BID) | ORAL | 12 refills | Status: DC

## 2016-09-02 NOTE — Patient Instructions (Signed)
Thank you for coming to see [place] at [facility]. I hope we have been able to provide you high quality care today.  You may receive a patient satisfaction survey over the next few weeks. We would appreciate your feedback and comments so that we may continue to improve ourselves and the health of our patients.   CHANGE IN HEADACHE PATTERN  - check MRI brain   MIGRAINE WITH AURA (since childhood) - migraine prevention: start topiramate 21m at bedtime; after 1 week increase to twice a day; drink plenty of water - migraine rescue: rizatriptan 160mas needed for breakthrough headache; may repeat x 1 after 2 hours; max 2 tabs per day or 8 per month - taper off tramadol    SNORING/EXCESSIVE DAYTIME SLEEPINESS - check sleep study (snoring, excessive daytime sleepiness, headaches)  PASSING OUT/SYNCOPE  - follow up MRI brain - monitor blood pressure at home - stay hydrated - follow up with PCP and consider cardiac workup   ~~~~~~~~~~~~~~~~~~~~~~~~~~~~~~~~~~~~~~~~~~~~~~~~~~~~~~~~~~~~~~~~~  DR. Aeon Koors'S GUIDE TO HAPPY AND HEALTHY LIVING These are some of my general health and wellness recommendations. Some of them may apply to you better than others. Please use common sense as you try these suggestions and feel free to ask me any questions.   ACTIVITY/FITNESS Mental, social, emotional and physical stimulation are very important for brain and body health. Try learning a new activity (arts, music, language, sports, games).  Keep moving your body to the best of your abilities. You can do this at home, inside or outside, the park, community center, gym or anywhere you like. Consider a physical therapist or personal trainer to get started. Consider the app Sworkit. Fitness trackers such as smart-watches, smart-phones or Fitbits can help as well.   NUTRITION Eat more plants: colorful vegetables, nuts, seeds and berries.  Eat less sugar, salt, preservatives and processed  foods.  Avoid toxins such as cigarettes and alcohol.  Drink water when you are thirsty. Warm water with a slice of lemon is an excellent morning drink to start the day.  Consider these websites for more information The Nutrition Source (hthttps://www.henry-hernandez.biz/[employer] Nutrition (wwWindowBlog.ch  RELAXATION Consider practicing mindfulness meditation or other relaxation techniques such as deep breathing, prayer, yoga, tai chi, massage. See website mindful.org or the apps Headspace or Calm to help get started.   SLEEP Try to get at least 7-8+ hours sleep per day. Regular exercise and reduced caffeine will help you sleep better. Practice good sleep hygeine techniques. See website sleep.org for more information.   PLANNING Prepare estate planning, living will, healthcare POA documents. Sometimes this is best planned with the help of an attorney. Theconversationproject.org and agingwithdignity.org are excellent resources.

## 2016-09-02 NOTE — Progress Notes (Signed)
[facility]  PATIENT: William Hunter DOB: [birth year]  REFERRING CLINICIAN: Misty Stanley HISTORY FROM: patient and wife  REASON FOR VISIT: new consult    HISTORICAL  CHIEF COMPLAINT:  Chief Complaint  Patient presents with  . Headache    rm 7, New Pt, wife- Enid Derry, "have had headaches all my life; getting worse in past 1-2 years"  . Abnormal CT head    HISTORY OF PRESENT ILLNESS:   71 year old left-handed male here for evaluation of headaches. For past 1-2 years patient has had daily headaches associated with photophobia, blurred vision seeing spots. 2 times a month he has severe headaches and has to lay down in a dark room.  Patient has history of headaches since he was a child, consisting of pressure, pounding and throbbing headaches, from front to back, midline, associated with nausea, vomiting and photophobia. He was never officially diagnosed with migraine headaches. He would have these headaches 2-3 times per week. Over the years she had number of days where he would miss school or work. The longest time he was ever headache free in his entire life was 1 month.  Patient presented to PCP recently, and due to severe headaches had CT scan of the head which showed ventriculomegaly and raise possibility of normal pressure hydrocephalus. Patient referred to me for further evaluation.  No other alleviating or aggravating factors. Patient takes some Tylenol or ibuprofen as needed for headaches.  Patient also had episode of passing out recently, where he felt hot and sweaty, standing in line at the cafeteria. No convulsions, postictal confusion, tongue biting or incontinence.   REVIEW OF SYSTEMS: Full 14 system review of systems performed and negative with exception of: Blurred vision snoring diarrhea joint pain joint swelling.  ALLERGIES: Allergies  Allergen Reactions  . Codeine Swelling  . Penicillins Hives and Swelling    Has patient had a PCN reaction causing  immediate rash, facial/tongue/throat swelling, SOB or lightheadedness with hypotension: Yes Has patient had a PCN reaction causing severe rash involving mucus membranes or skin necrosis: No Has patient had a PCN reaction that required hospitalization No Has patient had a PCN reaction occurring within the last 10 years: No If all of the above answers are "NO", then may proceed with Cephalosporin use.   Nilsa Nutting Alcohol [Lanolin] Hives  . Prednisone Hives    HOME MEDICATIONS: Outpatient Medications Prior to Visit  Medication Sig Dispense Refill  . acetaminophen (TYLENOL) 500 MG tablet Take 1,500 mg by mouth 3 (three) times daily as needed for headache (pain).    Marland Kitchen atorvastatin (LIPITOR) 40 MG tablet Take 1 tablet (40 mg total) by mouth daily. 30 tablet 3  . benazepril (LOTENSIN) 10 MG tablet Take 10 mg by mouth daily.     Marland Kitchen diltiazem (TAZTIA XT) 300 MG 24 hr capsule Take 1 capsule (300 mg total) by mouth daily. 30 capsule 11  . glimepiride (AMARYL) 4 MG tablet Take 4 mg by mouth daily before breakfast.    . metFORMIN (GLUCOPHAGE) 500 MG tablet Take 500 mg by mouth 2 (two) times daily with a meal.     . Multiple Vitamin (MULTIVITAMIN WITH MINERALS) TABS tablet Take 1 tablet by mouth daily.    Marland Kitchen PRESCRIPTION MEDICATION Take 2 tablets by mouth daily with supper. Double blind diabetes study through [facility] (Dr. Wilson Singer).     No facility-administered medications prior to visit.     PAST MEDICAL HISTORY: Past Medical History:  Diagnosis Date  . Anginal  pain ([place])   . Arthritis    "fingers" (03/09/2012)  . Cold feet    "from my diabetes; they stay cold" (03/09/2012)  . Coronary artery disease   . Coronary artery spasm [facility])    since age 71 Dr Glade Lloyd  . Dyslipidemia   . Headache(784.0)   . Hx of gallstones    Dr Dalbert Batman lap cholecystectomy  . Hx of plastic surgery    plastic surgery following a fall off a loading dock ,lost  all his upper teeth   . Hypertension   . MI, old     at age 68  . Migraines   . Pneumonia ~ 1962; 1970's   "double"; "regular" (03/09/2012)  . Rheumatoid arthritis ([place])    Dr Amil Amen   . Slipped cervical disc 2008   Dr Gaynelle Arabian in the past/ Dr Roderic Scarce , chiropractor in 2008  . Thyroid disease    Dr Dwyane Dee  . Type II diabetes mellitus ([place])     PAST SURGICAL HISTORY: Past Surgical History:  Procedure Laterality Date  . CARDIAC CATHETERIZATION     x 3 with NO stents   . CHOLECYSTECTOMY  03/11/2012   Procedure: LAPAROSCOPIC CHOLECYSTECTOMY WITH INTRAOPERATIVE CHOLANGIOGRAM;  Surgeon: Adin Hector, MD;  Location: [facility];  Service: General;  Laterality: N/A;  . PENILE PROSTHESIS IMPLANT  1983    FAMILY HISTORY: Family History  Problem Relation Age of Onset  . Heart failure Mother   . Cancer - Other Mother     breast  . Heart disease Father   . Cancer - Other Sister     tongue cancer  . Heart attack Sister     SOCIAL HISTORY:  Social History   Social History  . Marital status: Married    Spouse name: Enid Derry  . Number of children: 0  . Years of education: 14   Occupational History  .      [employer]   Social History Main Topics  . Smoking status: Never Smoker  . Smokeless tobacco: Never Used  . Alcohol use No  . Drug use: No  . Sexual activity: Yes   Other Topics Concern  . Not on file   Social History Narrative   Lives with wife   Caffeine- Diet Coke 5 daily     PHYSICAL EXAM  GENERAL EXAM/CONSTITUTIONAL: Vitals:  Vitals:   09/02/16 1413  BP: 132/64  Pulse: 86  Weight: 209 lb 9.6 oz (95.1 kg)  Height: '5\' 11"'  (1.803 m)     Body mass index is 29.23 kg/m.  Visual Acuity Screening   Right eye Left eye Both eyes  Without correction:     With correction: 20/30 20/40      Patient is in no distress; well developed, nourished and groomed; neck is supple  CARDIOVASCULAR:  Examination of carotid arteries is normal; no carotid bruits  Regular rate and rhythm, no  murmurs  Examination of peripheral vascular system by observation and palpation is normal  EYES:  Ophthalmoscopic exam of optic discs and posterior segments is normal; no papilledema or hemorrhages  MUSCULOSKELETAL:  Gait, strength, tone, movements noted in Neurologic exam below  NEUROLOGIC: MENTAL STATUS:  No flowsheet data found.  awake, alert, oriented to person, place and time  recent and remote memory intact  normal attention and concentration  language fluent, comprehension intact, naming intact,   fund of knowledge appropriate  CRANIAL NERVE:   2nd - no papilledema on fundoscopic exam  2nd, 3rd, 4th, 6th -  pupils equal and reactive to light, visual fields full to confrontation, extraocular muscles intact, no nystagmus  5th - facial sensation symmetric  7th - facial strength symmetric  8th - hearing intact  9th - palate elevates symmetrically, uvula midline  11th - shoulder shrug symmetric  12th - tongue protrusion midline  MOTOR:   normal bulk and tone, full strength in the BUE, BLE  SENSORY:   normal and symmetric to light touch, temperature, vibration  COORDINATION:   finger-nose-finger, fine finger movements normal  REFLEXES:   deep tendon reflexes TRACE and symmetric  GAIT/STATION:   narrow based gait; DECR RIGHT ARM SWING; SLOW AND CAUTIOUS    DIAGNOSTIC DATA (LABS, IMAGING, TESTING) - I reviewed patient records, labs, notes, testing and imaging myself where available.  Lab Results  Component Value Date   WBC 13.7 (H) 05/02/2016   HGB 15.0 05/02/2016   HCT 44.4 05/02/2016   MCV 86.2 05/02/2016   PLT 228 05/02/2016      Component Value Date/Time   NA 137 05/02/2016 1608   K 4.8 05/02/2016 1608   CL 103 05/02/2016 1608   CO2 26 05/02/2016 1608   GLUCOSE 151 (H) 05/02/2016 1608   BUN 21 (H) 05/02/2016 1608   CREATININE 1.05 05/02/2016 1608   CALCIUM 9.0 05/02/2016 1608   PROT 7.1 05/02/2016 1608   ALBUMIN 3.8 05/02/2016  1608   AST 25 05/02/2016 1608   ALT 24 05/02/2016 1608   ALKPHOS 64 05/02/2016 1608   BILITOT 0.3 05/02/2016 1608   GFRNONAA >60 05/02/2016 1608   GFRAA >60 05/02/2016 1608   Lab Results  Component Value Date   CHOL 140 09/16/2013   HDL 36.50 (L) 09/16/2013   LDLCALC 80 09/16/2013   TRIG 117.0 09/16/2013   CHOLHDL 4 09/16/2013   Lab Results  Component Value Date   HGBA1C 8.7 (H) 03/09/2012   No results found for: VITAMINB12 Lab Results  Component Value Date   TSH 0.221 Test methodology is 3rd generation TSH (L) 08/06/2007    09/27/15 ESR 17  07/18/16 A1c 6.9   06/07/16 CT head [I reviewed images myself and agree with interpretation. -VRP]  - Moderate ventricular enlargement with significant progression since 2006. There is also progression of atrophy. Ventricular enlargement may be due to atrophy however consider NPH. - Chronic microvascular ischemic change in the white matter. No acute infarct or mass.    ASSESSMENT AND PLAN  71 y.o. year old male here with Lifelong history of headaches since childhood, most consistent with migraine with aura. Also with change in headache pattern and severity in the last 1-2 years, which may represent change in migraine versus other secondary cause. Also with abnormal CT scan of the head, most likely related to subcortical atrophy rather than normal pressure hydrocephalus.   Ddx: migraine vs secondary headache  1. Migraine with aura and without status migrainosus, not intractable   2. Chronic daily headache   3. Snoring   4. Excessive daytime sleepiness   5. Abnormal CT of the head   6. Syncope, unspecified syncope type   7. Diabetic polyneuropathy associated with type 2 diabetes mellitus ([place])   8. Rheumatoid arthritis, involving unspecified site, unspecified rheumatoid factor presence ([place])      PLAN:  CHANGE IN HEADACHE PATTERN (increasing severity and frequency) - check MRI brain (with and without) to evaluate for secondary  causes of headache; (rule out mass or stroke)  MIGRAINE WITH AURA (since childhood) - migraine prevention: start topiramate 96m at  bedtime; after 1 week increase to twice a day; drink plenty of water - migraine rescue: rizatriptan 43m as needed for breakthrough headache; may repeat x 1 after 2 hours; max 2 tabs per day or 8 per month - taper off tramadol    ABNORMAL CT HEAD  - no definite clinical signs of NPH; will check MRI brain to look for any other supporting or refuting clues  SNORING/EXCESSIVE DAYTIME SLEEPINESS - check sleep study (snoring, excessive daytime sleepiness, headaches)  SYNCOPE (suspect vasovagal event which overheated, standing in line) - follow up MRI brain - monitor BP at home - stay hydrated - follow up with PCP and consider cardiac workup   Orders Placed This Encounter  Procedures  . MR BRAIN W WO CONTRAST  . Ambulatory referral to Sleep Studies   Meds ordered this encounter  Medications  . topiramate (TOPAMAX) 50 MG tablet    Sig: Take 1 tablet (50 mg total) by mouth 2 (two) times daily.    Dispense:  60 tablet    Refill:  12  . rizatriptan (MAXALT-MLT) 10 MG disintegrating tablet    Sig: Take 1 tablet (10 mg total) by mouth as needed for migraine. May repeat in 2 hours if needed    Dispense:  9 tablet    Refill:  11   Return in about 3 months (around 12/03/2016).  I reviewed images, labs, notes, records myself. I summarized findings and reviewed with patient, for this high risk condition (syncope, headaches) requiring high complexity decision making.    VPenni Bombard MD 35/27/1292 29:09PM Certified in Neurology, Neurophysiology and Neuroimaging  [facility] [address] [place] [phone]

## 2016-09-12 DIAGNOSIS — M150 Primary generalized (osteo)arthritis: Secondary | ICD-10-CM | POA: Diagnosis not present

## 2016-09-12 DIAGNOSIS — M0589 Other rheumatoid arthritis with rheumatoid factor of multiple sites: Secondary | ICD-10-CM | POA: Diagnosis not present

## 2016-09-12 DIAGNOSIS — Z79899 Other long term (current) drug therapy: Secondary | ICD-10-CM | POA: Diagnosis not present

## 2016-09-16 ENCOUNTER — Emergency Department (HOSPITAL_COMMUNITY)
Admission: EM | Admit: 2016-09-16 | Discharge: 2016-09-16 | Disposition: A | Discharge status: Home / Self Care | Payer: Medicare HMO | Attending: Emergency Medicine | Admitting: Emergency Medicine

## 2016-09-16 ENCOUNTER — Emergency Department (HOSPITAL_COMMUNITY): Payer: Medicare HMO

## 2016-09-16 ENCOUNTER — Other Ambulatory Visit: Payer: Self-pay

## 2016-09-16 ENCOUNTER — Encounter (HOSPITAL_COMMUNITY): Payer: Self-pay | Admitting: Emergency Medicine

## 2016-09-16 DIAGNOSIS — R1084 Generalized abdominal pain: Secondary | ICD-10-CM | POA: Insufficient documentation

## 2016-09-16 DIAGNOSIS — R51 Headache: Secondary | ICD-10-CM | POA: Insufficient documentation

## 2016-09-16 DIAGNOSIS — R109 Unspecified abdominal pain: Secondary | ICD-10-CM | POA: Diagnosis not present

## 2016-09-16 DIAGNOSIS — R197 Diarrhea, unspecified: Secondary | ICD-10-CM | POA: Diagnosis not present

## 2016-09-16 DIAGNOSIS — E1140 Type 2 diabetes mellitus with diabetic neuropathy, unspecified: Secondary | ICD-10-CM | POA: Insufficient documentation

## 2016-09-16 DIAGNOSIS — I2510 Atherosclerotic heart disease of native coronary artery without angina pectoris: Secondary | ICD-10-CM | POA: Diagnosis not present

## 2016-09-16 DIAGNOSIS — I252 Old myocardial infarction: Secondary | ICD-10-CM | POA: Diagnosis not present

## 2016-09-16 DIAGNOSIS — R112 Nausea with vomiting, unspecified: Secondary | ICD-10-CM | POA: Diagnosis not present

## 2016-09-16 DIAGNOSIS — R079 Chest pain, unspecified: Secondary | ICD-10-CM | POA: Diagnosis not present

## 2016-09-16 DIAGNOSIS — Z7984 Long term (current) use of oral hypoglycemic drugs: Secondary | ICD-10-CM | POA: Diagnosis not present

## 2016-09-16 DIAGNOSIS — I10 Essential (primary) hypertension: Secondary | ICD-10-CM | POA: Insufficient documentation

## 2016-09-16 DIAGNOSIS — Z79899 Other long term (current) drug therapy: Secondary | ICD-10-CM | POA: Diagnosis not present

## 2016-09-16 DIAGNOSIS — R1110 Vomiting, unspecified: Secondary | ICD-10-CM | POA: Diagnosis not present

## 2016-09-16 LAB — I-STAT TROPONIN, ED: TROPONIN I, POC: 0 ng/mL (ref 0.00–0.08)

## 2016-09-16 LAB — CBC
HEMATOCRIT: 46.9 % (ref 39.0–52.0)
HEMOGLOBIN: 15.9 g/dL (ref 13.0–17.0)
MCH: 30 pg (ref 26.0–34.0)
MCHC: 33.9 g/dL (ref 30.0–36.0)
MCV: 88.5 fL (ref 78.0–100.0)
Platelets: 294 10*3/uL (ref 150–400)
RBC: 5.3 MIL/uL (ref 4.22–5.81)
RDW: 14.3 % (ref 11.5–15.5)
WBC: 12.1 10*3/uL — AB (ref 4.0–10.5)

## 2016-09-16 LAB — HEPATIC FUNCTION PANEL
ALBUMIN: 4.5 g/dL (ref 3.5–5.0)
ALK PHOS: 69 U/L (ref 38–126)
ALT: 21 U/L (ref 17–63)
AST: 22 U/L (ref 15–41)
Bilirubin, Direct: 0.2 mg/dL (ref 0.1–0.5)
Indirect Bilirubin: 0.6 mg/dL (ref 0.3–0.9)
TOTAL PROTEIN: 8 g/dL (ref 6.5–8.1)
Total Bilirubin: 0.8 mg/dL (ref 0.3–1.2)

## 2016-09-16 LAB — BASIC METABOLIC PANEL
ANION GAP: 11 (ref 5–15)
BUN: 19 mg/dL (ref 6–20)
CALCIUM: 9.4 mg/dL (ref 8.9–10.3)
CO2: 21 mmol/L — ABNORMAL LOW (ref 22–32)
Chloride: 102 mmol/L (ref 101–111)
Creatinine, Ser: 1.03 mg/dL (ref 0.61–1.24)
Glucose, Bld: 160 mg/dL — ABNORMAL HIGH (ref 65–99)
POTASSIUM: 3.7 mmol/L (ref 3.5–5.1)
SODIUM: 134 mmol/L — AB (ref 135–145)

## 2016-09-16 LAB — LIPASE, BLOOD: Lipase: 32 U/L (ref 11–51)

## 2016-09-16 IMAGING — CT CT ABD-PELV W/ CM
2 of 5 series · 16 of 46 positions shown, 18 images · IV contrast (Omni 300)
Comparison: None.

CLINICAL DATA: Initial evaluation for acute abdominal pain,
vomiting.

EXAM:
CT ABDOMEN AND PELVIS WITH CONTRAST
TECHNIQUE: Multidetector CT imaging of the abdomen and pelvis was performed
using the standard protocol following bolus administration of
intravenous contrast.
CONTRAST:  100mL [HR] IOPAMIDOL ([HR]) INJECTION 61%

[Series 4: a/p w/ 5mm · axial · 0.80mm/px · z∈[-927,-462]mm · 13 of 105 slices shown, 15 images]
[im 6/105  soft-tissue]
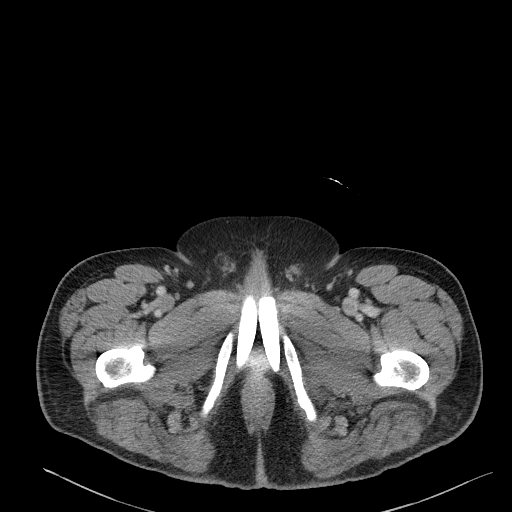
[im 6/105  bone]
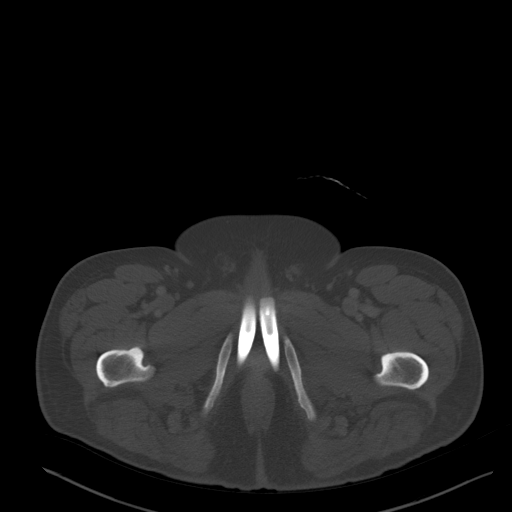
[im 16/105  soft-tissue]
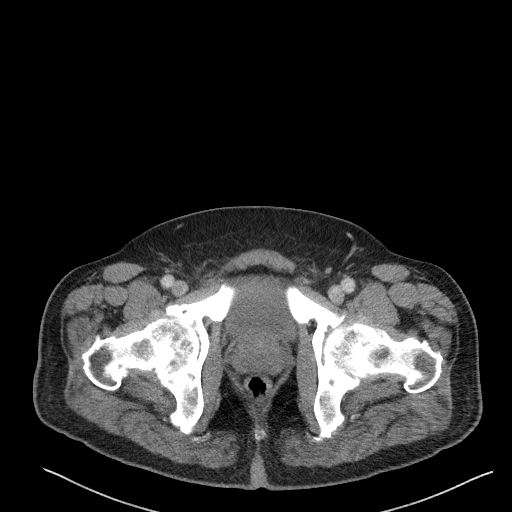
[im 21/105  soft-tissue]
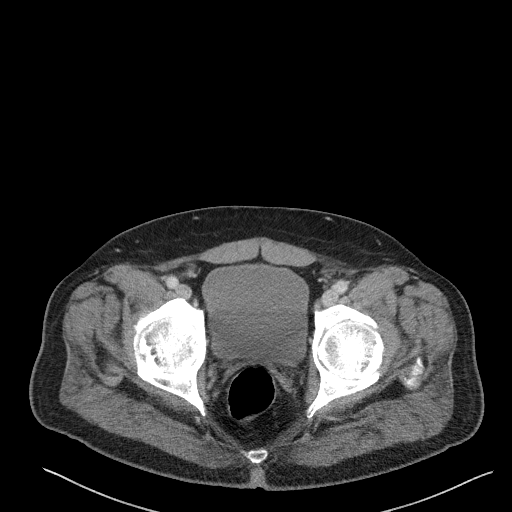
[im 32/105  soft-tissue]
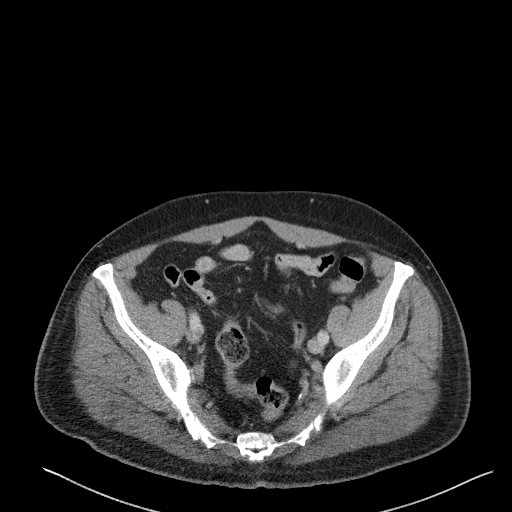
[im 37/105  soft-tissue]
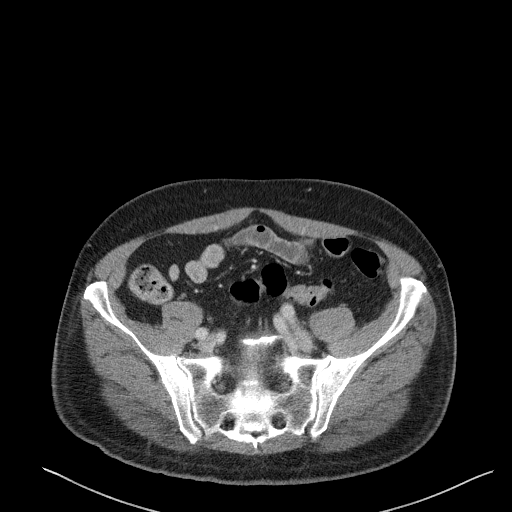
[im 47/105  soft-tissue]
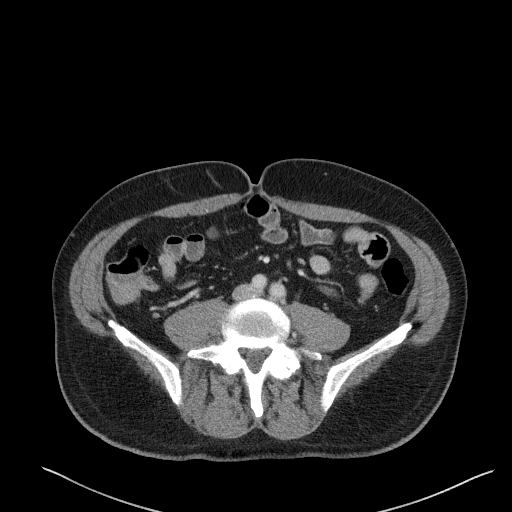
[im 53/105  soft-tissue]
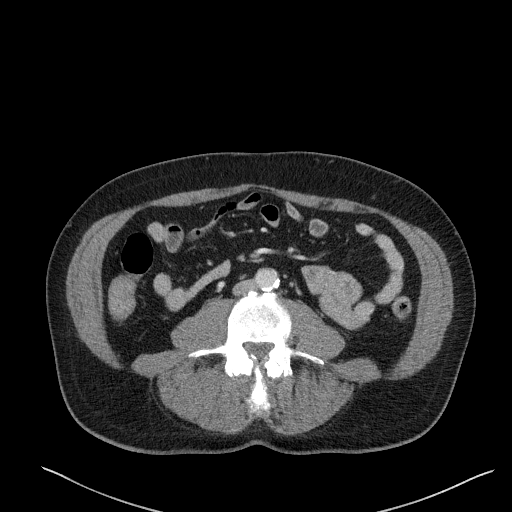
[im 58/105  soft-tissue]
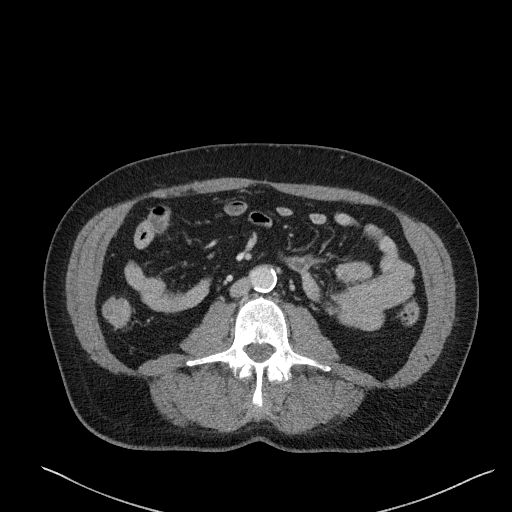
[im 68/105  soft-tissue]
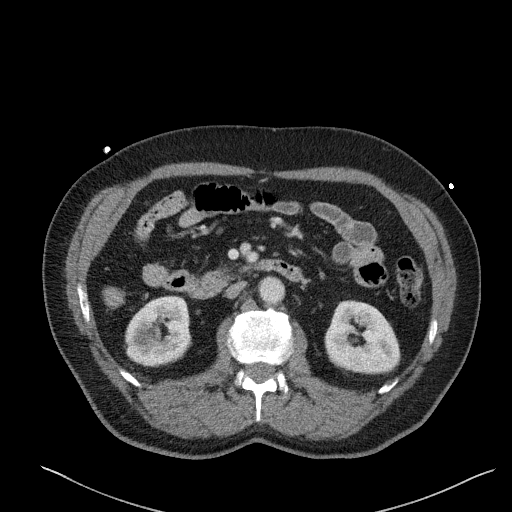
[im 68/105  bone]
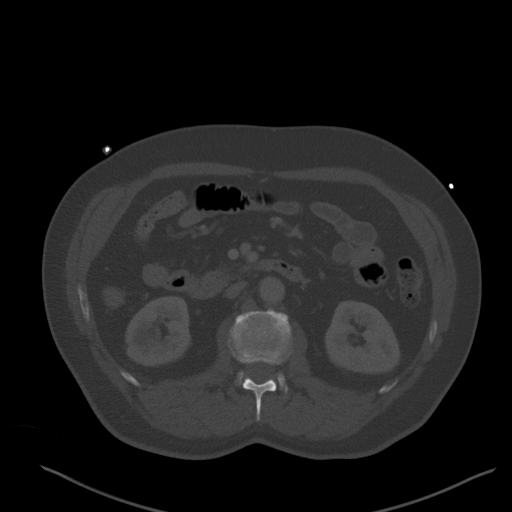
[im 73/105  soft-tissue]
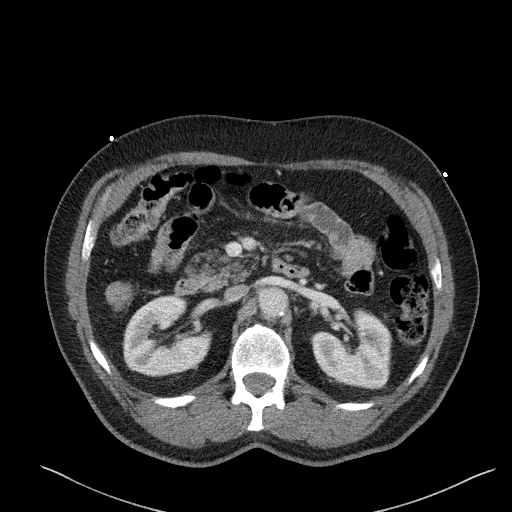
[im 84/105  soft-tissue]
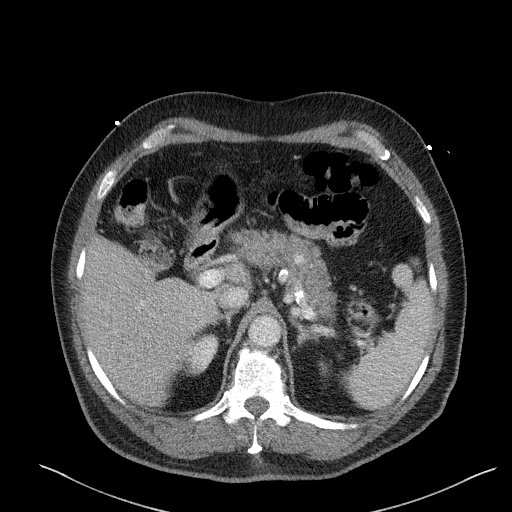
[im 89/105  soft-tissue]
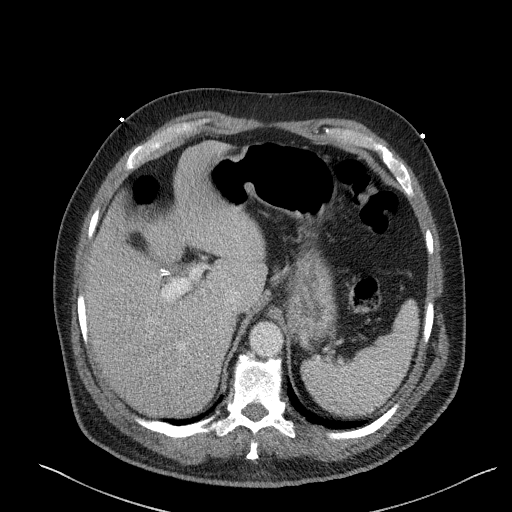
[im 99/105  soft-tissue]
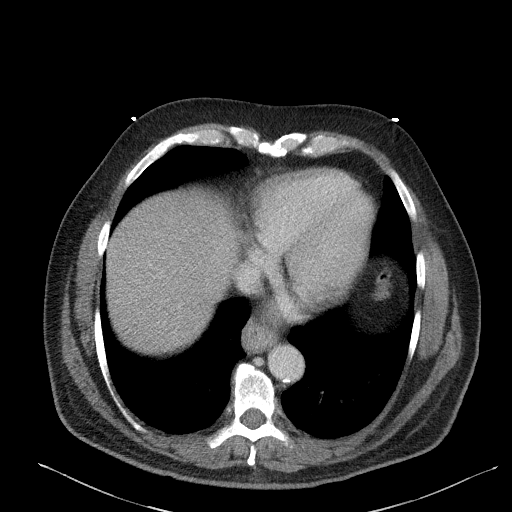

[Series 7: a/p w/ cor · coronal · 0.73mm/px · 3 of 151 slices shown]
[im 51/151  soft-tissue]
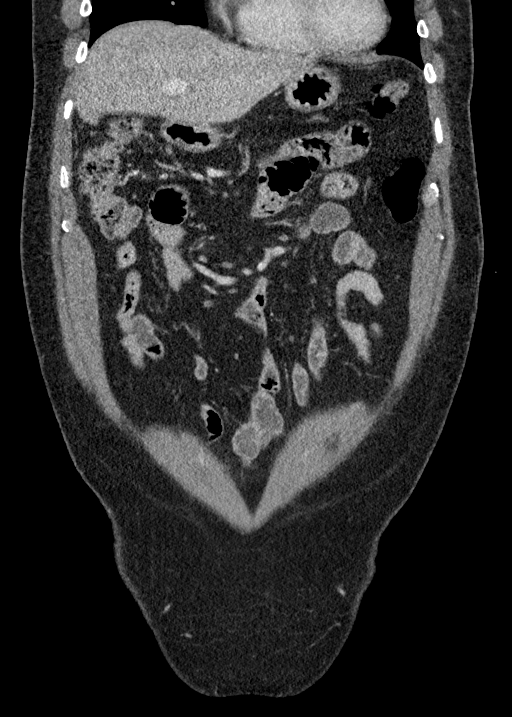
[im 67/151  soft-tissue]
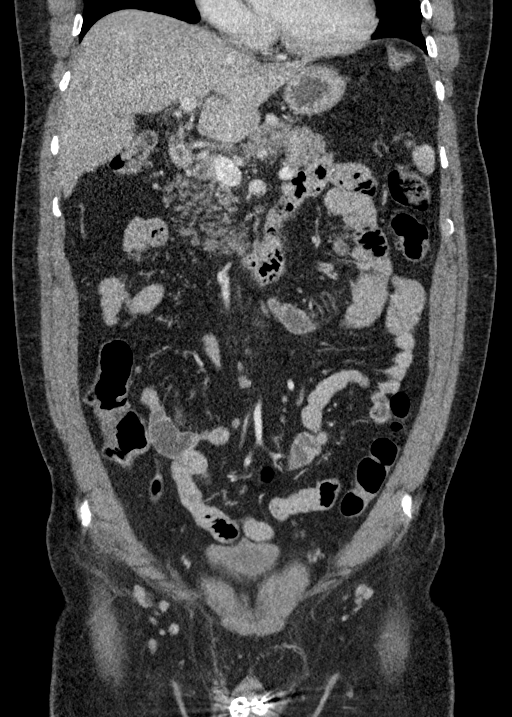
[im 84/151  soft-tissue]
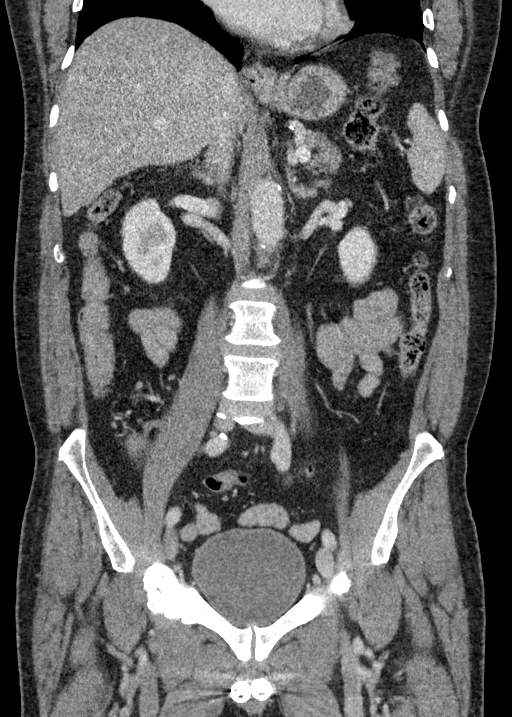

[16 of 46 positions shown; findings below may reference images not displayed]

FINDINGS: Lower chest: Mild bibasilar atelectatic changes present. Visualized
lung bases otherwise clear.

Hepatobiliary: Liver within normal limits. Gallbladder surgically
absent. No biliary dilatation.

Pancreas: Pancreas within normal limits.

Spleen: Spleen within normal limits.

Adrenals/Urinary Tract: Adrenal glands are normal. Kidneys equal in
size with symmetric enhancement. Small cysts noted within the
interpolar right kidney. No nephrolithiasis, hydronephrosis, or
focal enhancing renal mass. No hydroureter. Bladder within normal
limits.

Stomach/Bowel: Stomach within normal limits. No evidence for bowel
obstruction. Appendix normal. No acute inflammatory changes seen
about the bowels.

Vascular/Lymphatic: Normal intravascular enhancement seen throughout
the intra-abdominal aorta and its branch vessels. Moderate aorto
bi-iliac atherosclerotic disease. No aneurysm. No adenopathy.

Reproductive: Prostate normal.  Penile implant noted.

Other: No free air or fluid.

Musculoskeletal: No acute osseous abnormality. No worrisome lytic or
blastic osseous lesions. Degenerative osteoarthrosis about the hips
bilaterally. Remotely healed right posterior rib fractures noted.
The
IMPRESSION: 1. No CT evidence for acute intra-abdominal or pelvic process.
2. Status post cholecystectomy.
3. Moderate aorto bi-iliac atherosclerotic disease.

## 2016-09-16 IMAGING — CT CT HEAD W/O CM
4 series · 16 of 47 positions shown, 18 images · non-contrast
Comparison: [DATE].

CLINICAL DATA: 70-year-old male with acute headache.

EXAM:
CT HEAD WITHOUT CONTRAST
TECHNIQUE: Contiguous axial images were obtained from the base of the skull
through the vertex without intravenous contrast.

[Series 3: head without · axial · non-contrast · 0.48mm/px · z∈[-54,+66]mm · 7 of 32 slices shown, 9 images]
[im 4/32  brain]
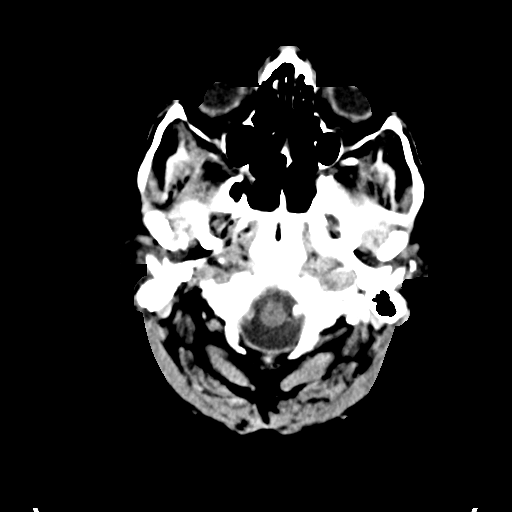
[im 4/32  bone]
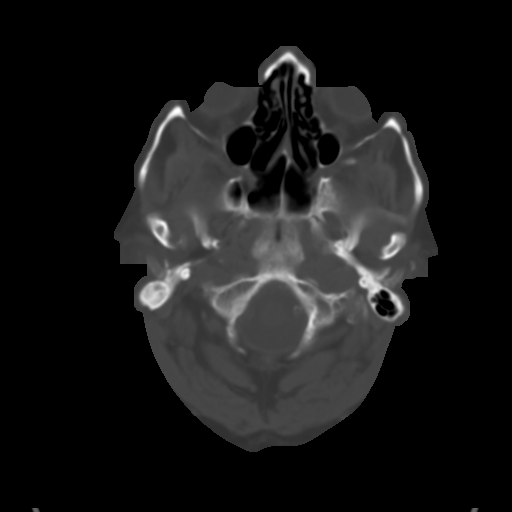
[im 8/32  brain]
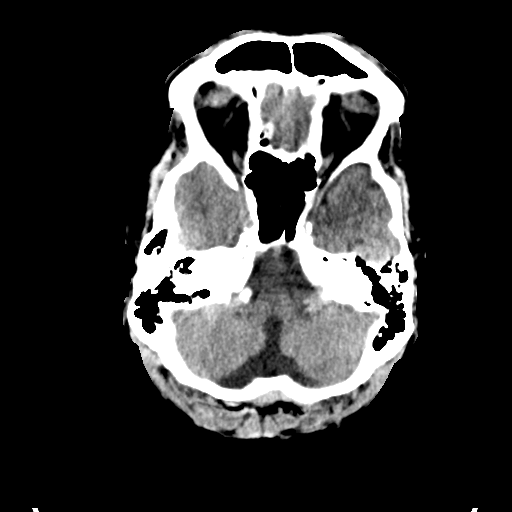
[im 12/32  brain]
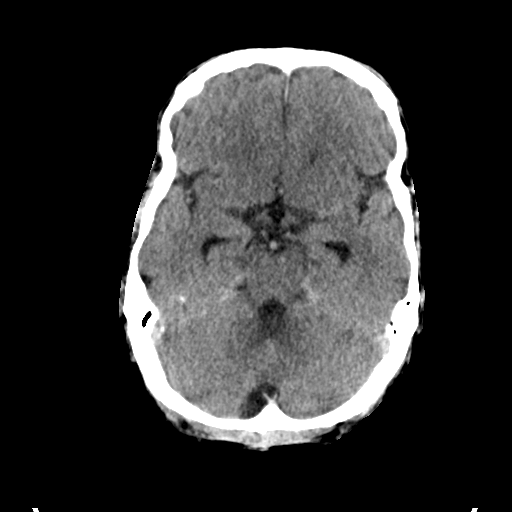
[im 16/32  brain]
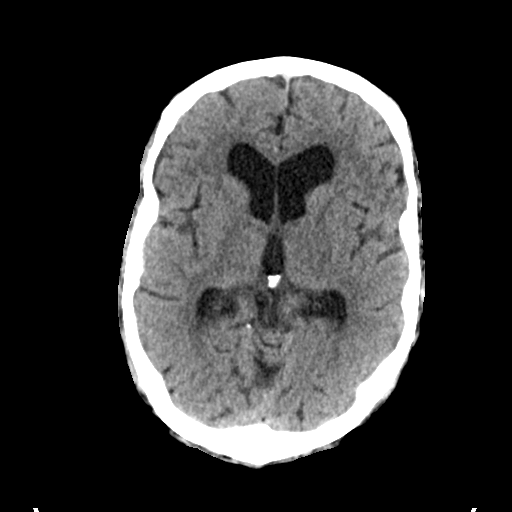
[im 20/32  brain]
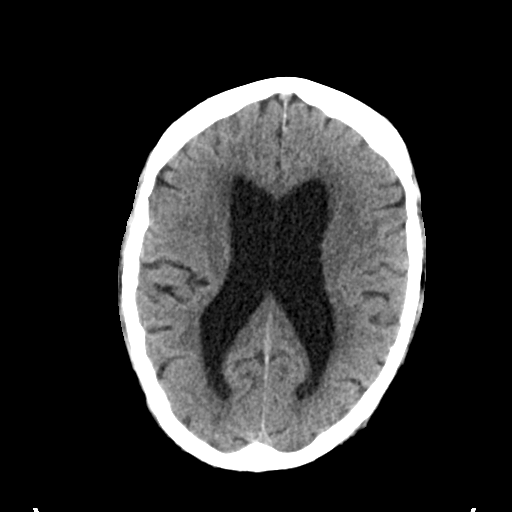
[im 20/32  bone]
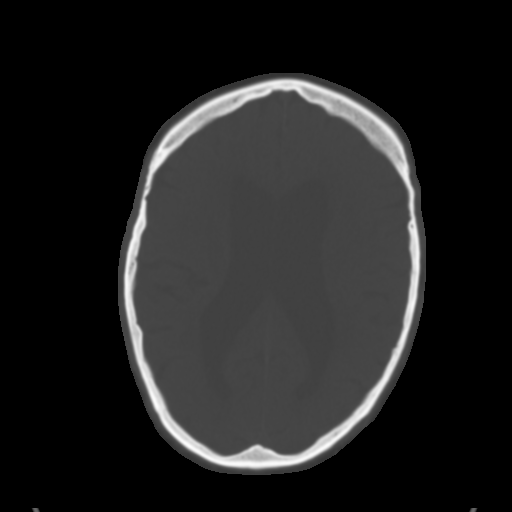
[im 24/32  brain]
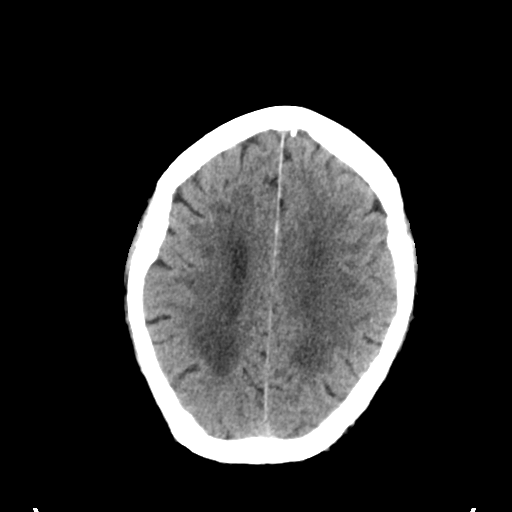
[im 28/32  brain]
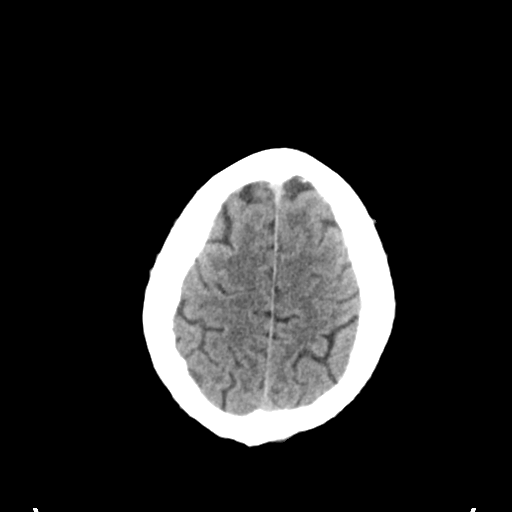

[Series 4: head bone · axial · 0.48mm/px · z∈[-55,-23]mm · 3 of 80 slices shown]
[im 8/80  bone]
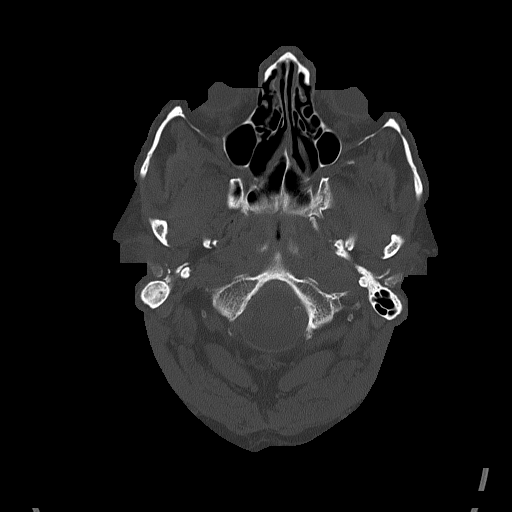
[im 16/80  bone]
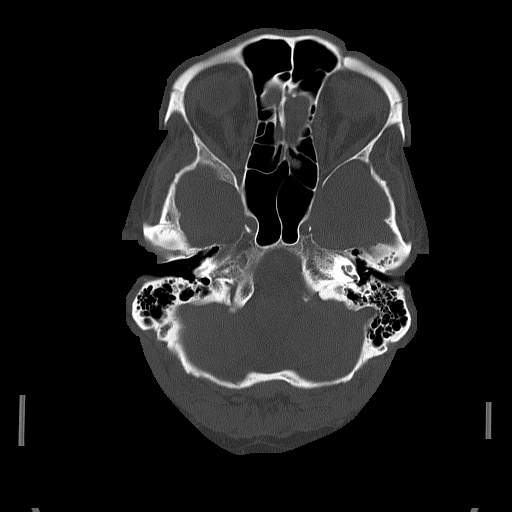
[im 24/80  bone]
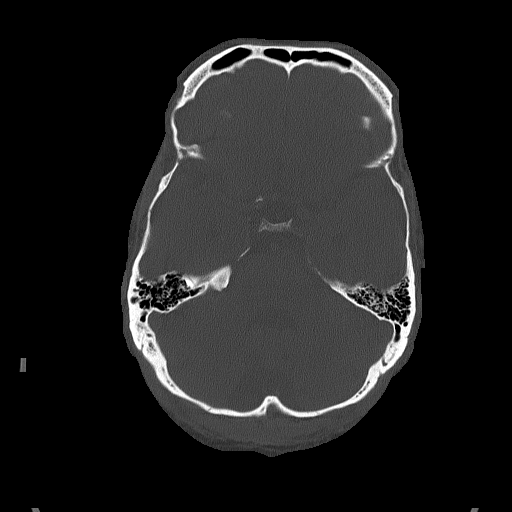

[Series 5: head without cor · coronal · non-contrast · 0.34mm/px · 3 of 76 slices shown]
[im 26/76  brain]
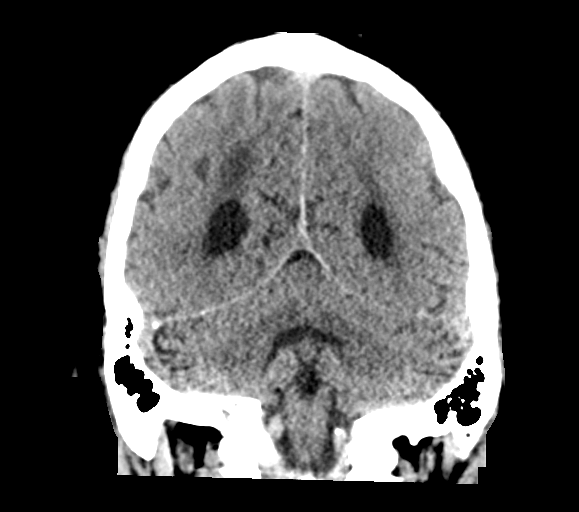
[im 34/76  brain]
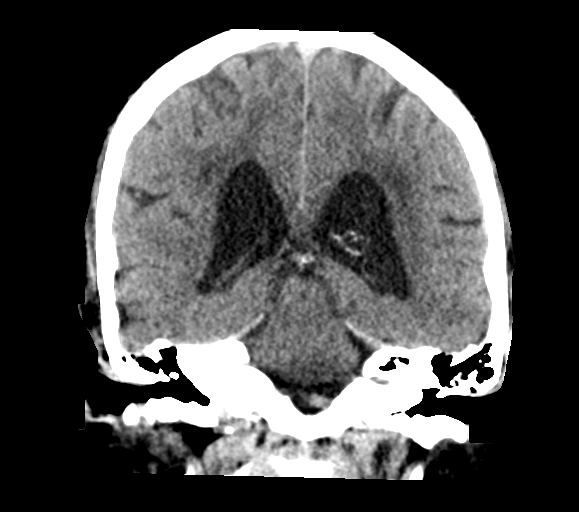
[im 42/76  brain]
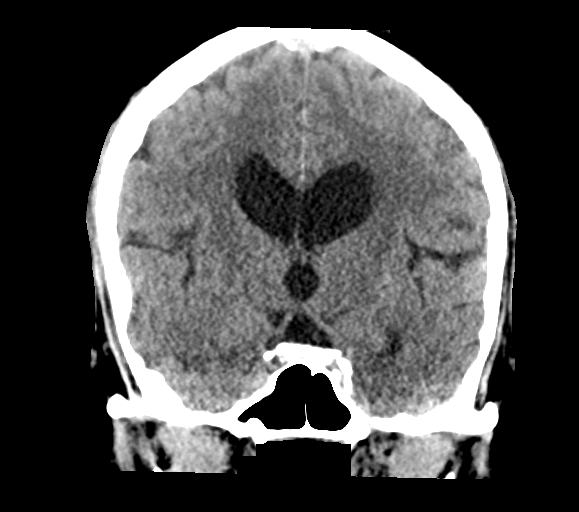

[Series 6: head without sag · sagittal · non-contrast · 0.35mm/px · 3 of 64 slices shown]
[im 22/64  brain]
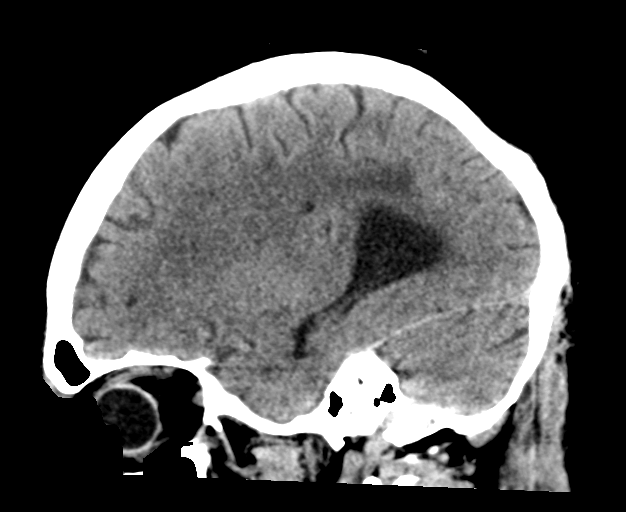
[im 32/64  brain]
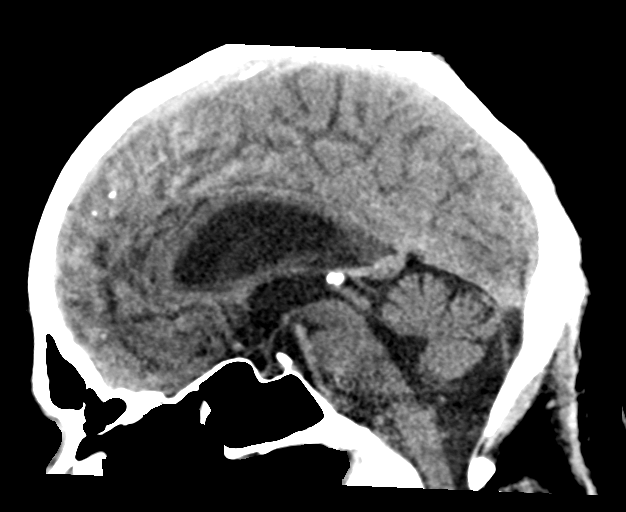
[im 43/64  brain]
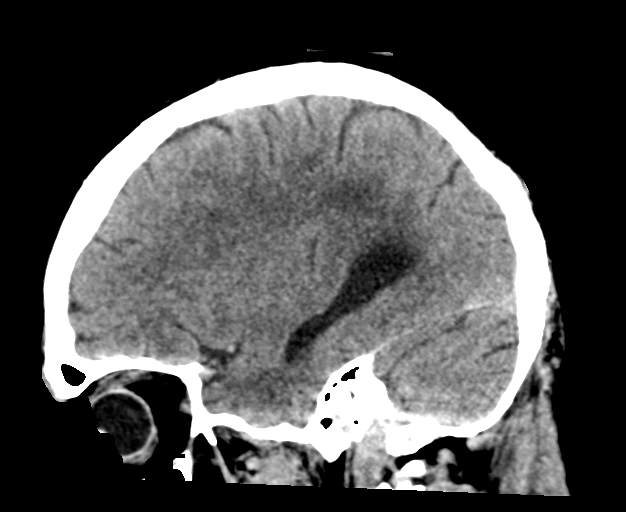

[16 of 47 positions shown; findings below may reference images not displayed]

FINDINGS: Brain: Mild cerebral atrophy with some associated ex vacuo
dilatation of the ventricular system, similar to the prior study.
Patchy and confluent areas of decreased attenuation are noted
throughout the deep and periventricular white matter of the cerebral
hemispheres bilaterally, compatible with chronic microvascular
ischemic disease. No evidence of acute infarction, hemorrhage,
extra-axial collection or mass lesion/mass effect.

Vascular: No hyperdense vessel or unexpected calcification.

Skull: Normal. Negative for fracture or focal lesion.

Sinuses/Orbits: No acute finding.

Other: None.
IMPRESSION: 1. There continues to be some very mild cerebral atrophy with
extensive chronic microvascular ischemic changes in the cerebral
white matter and some associated ex vacuo dilatation of the
ventricular system. As mentioned on the prior study, the possibility
of normal pressure hydrocephalus (NPH) is not entirely excluded.
2. No other definite acute intracranial abnormality.

## 2016-09-16 IMAGING — CR DG CHEST 2V
2 series · 2 of 2 positions shown · non-contrast
Comparison: [DATE]

CLINICAL DATA: Bilateral chest pain, intermittent shortness of
breath for several months, headache

EXAM:
CHEST  2 VIEW

[chest pa]
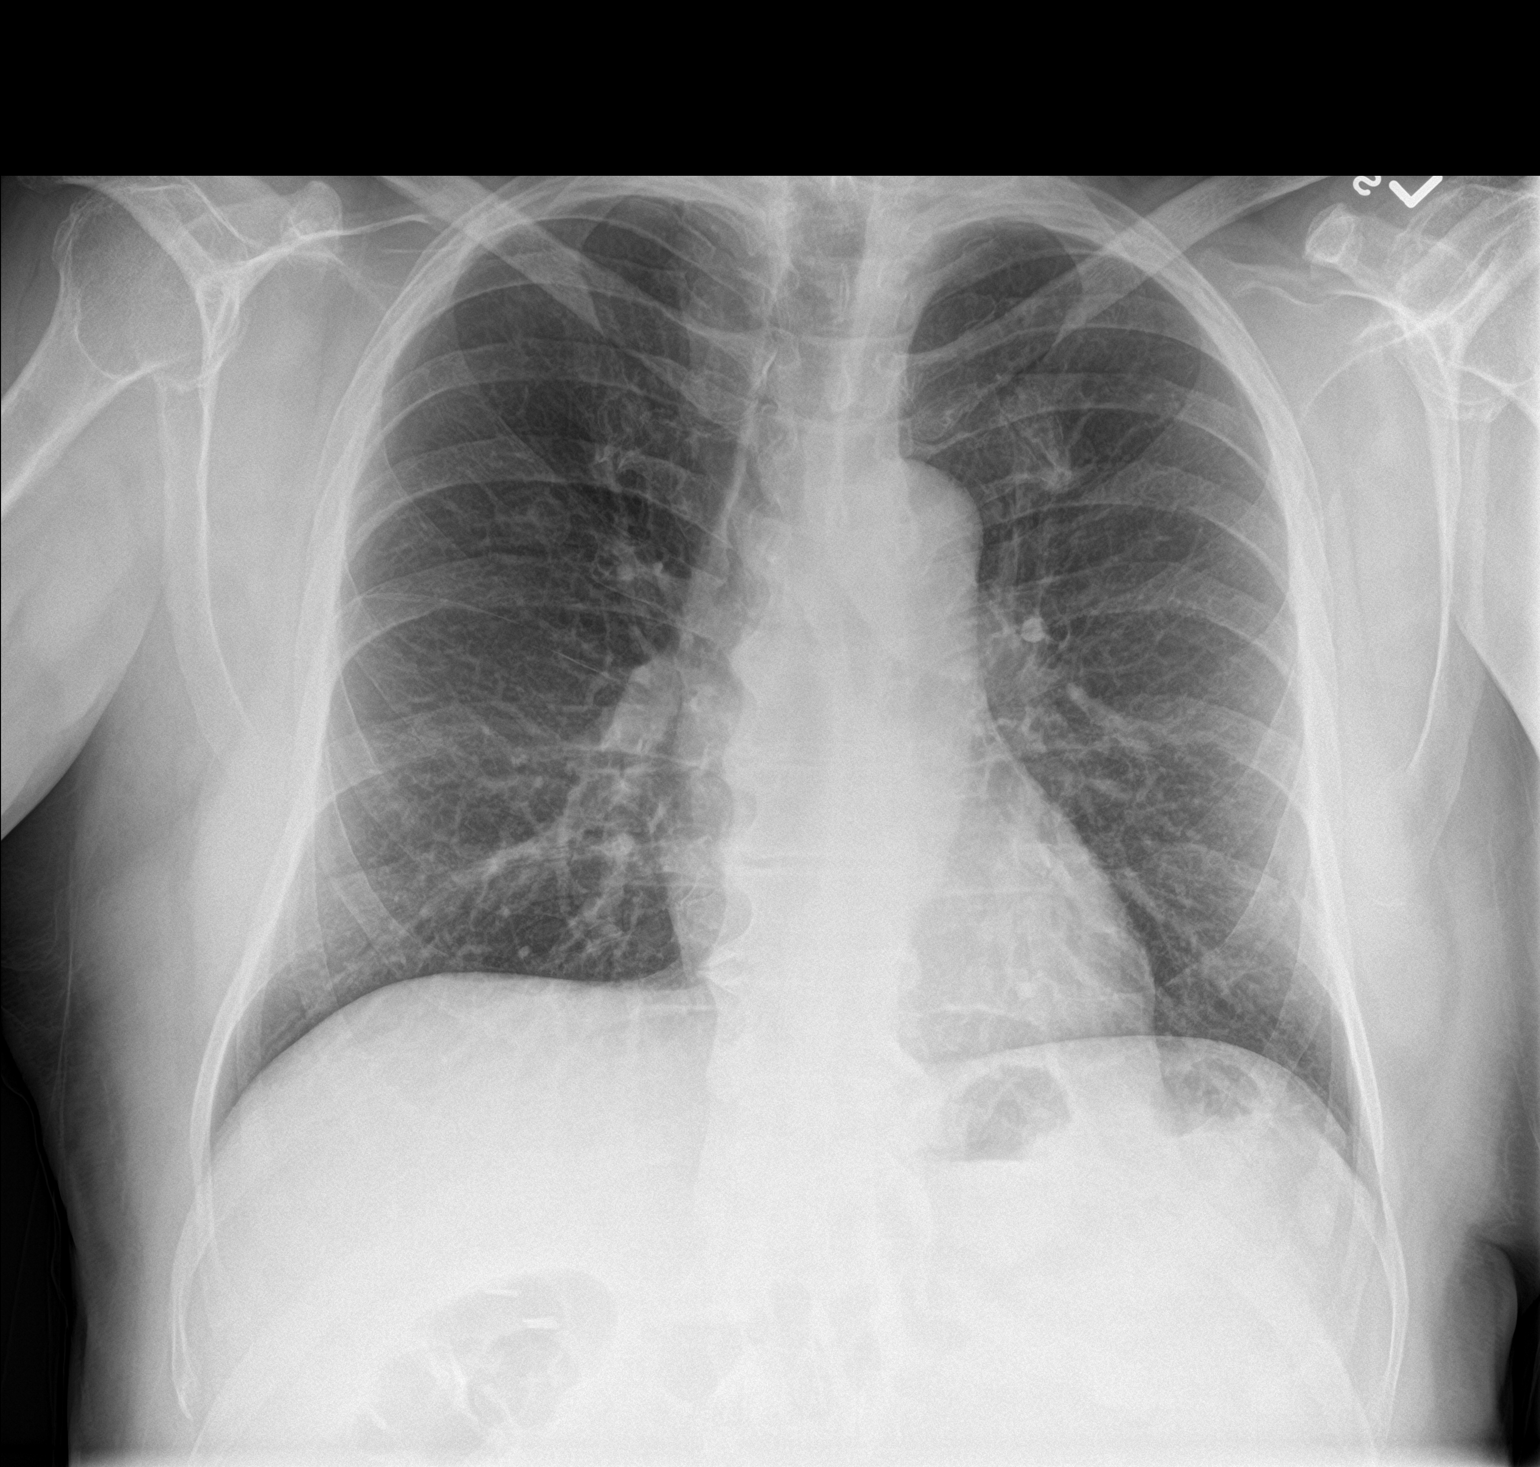

[chest lat]
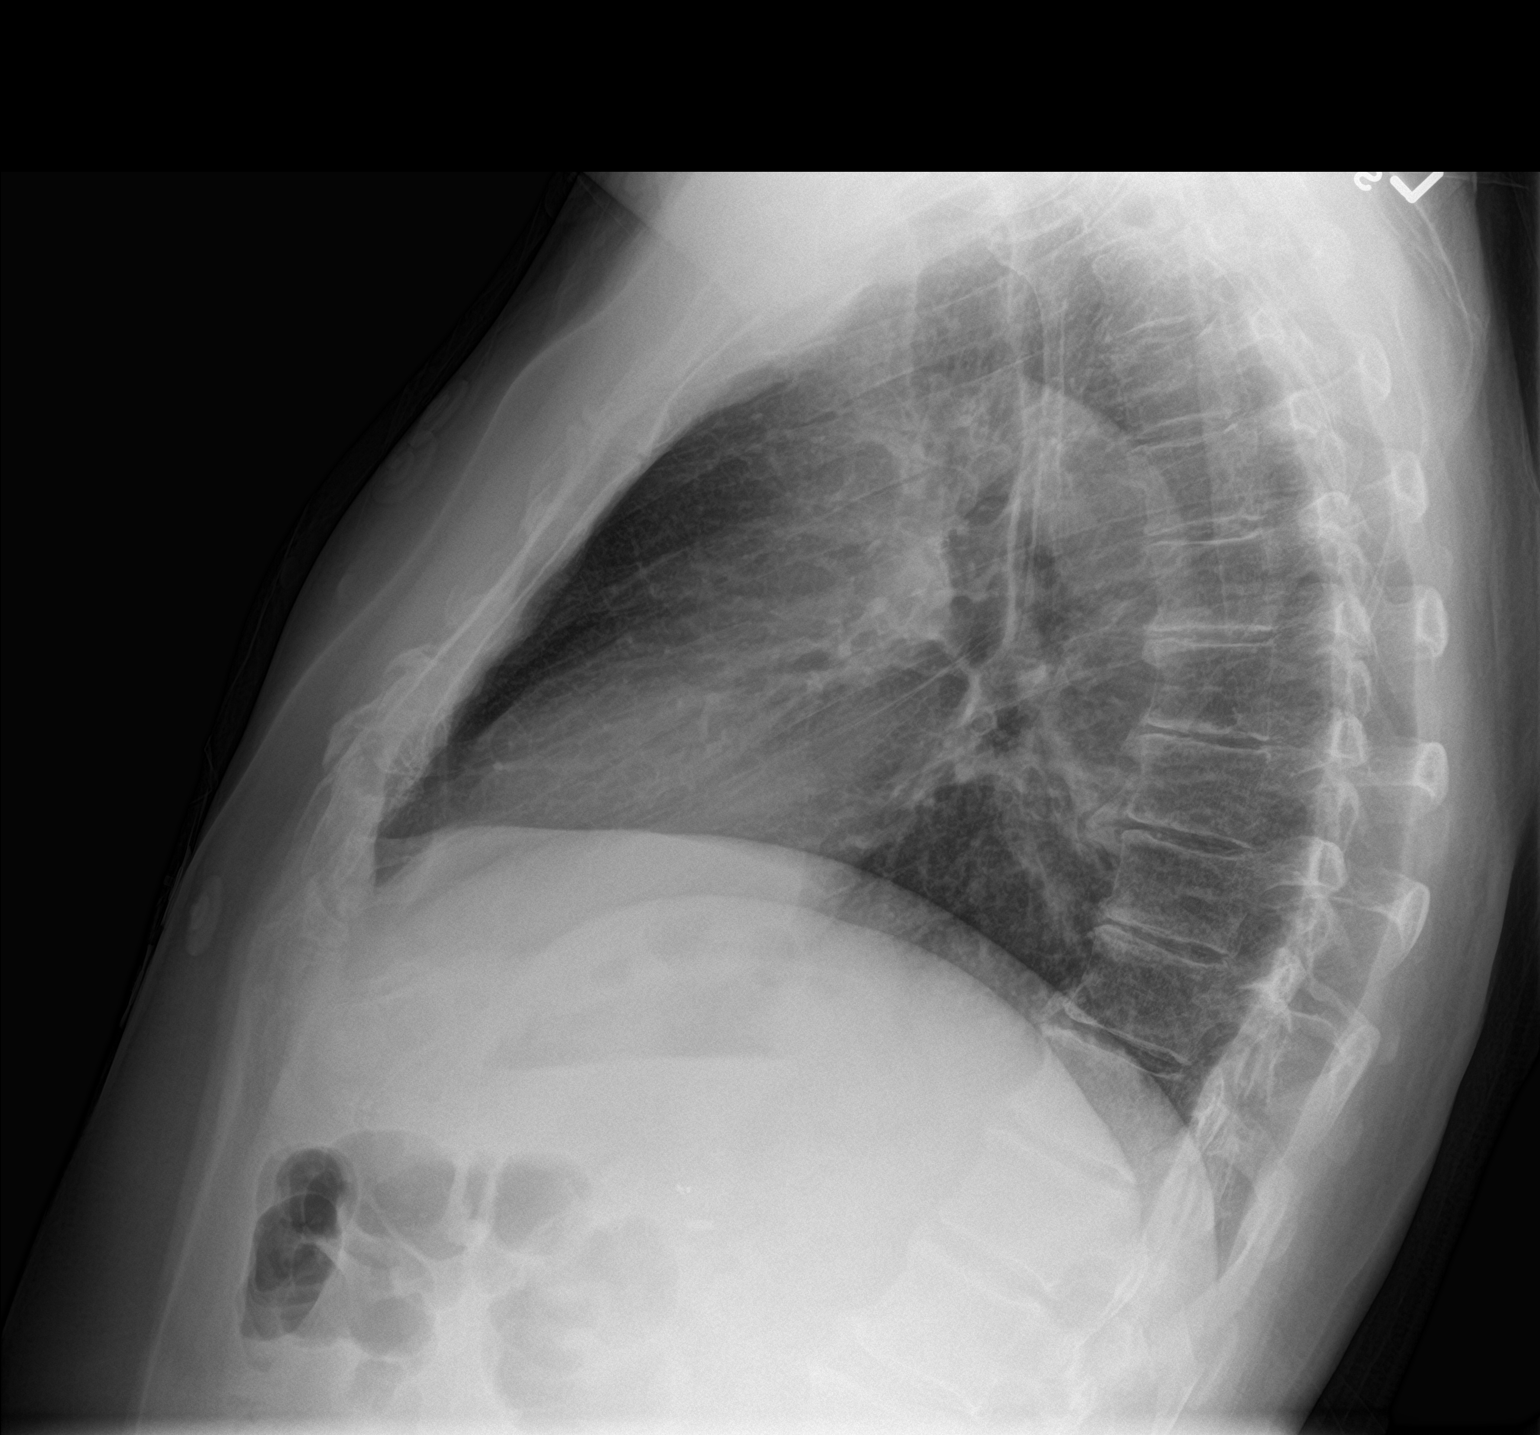

[2 of 2 positions shown; findings below may reference images not displayed]

FINDINGS: Cardiomediastinal silhouette is stable. No infiltrate or pleural
effusion. No pulmonary edema. Mild degenerative changes mid and
lower thoracic spine.
IMPRESSION: No active cardiopulmonary disease. Mild degenerative changes mid and
lower thoracic spine.

## 2016-09-16 MED ORDER — ONDANSETRON HCL 4 MG/2ML IJ SOLN
4.0000 mg | Freq: Once | INTRAMUSCULAR | Status: AC
  Administered 2016-09-16: 4 mg via INTRAVENOUS
  Filled 2016-09-16: qty 2

## 2016-09-16 MED ORDER — FENTANYL CITRATE (PF) 100 MCG/2ML IJ SOLN
50.0000 ug | Freq: Once | INTRAMUSCULAR | Status: AC
  Administered 2016-09-16: 50 ug via INTRAVENOUS
  Filled 2016-09-16: qty 2

## 2016-09-16 MED ORDER — IOPAMIDOL (ISOVUE-300) INJECTION 61%
INTRAVENOUS | Status: AC
  Administered 2016-09-16: 100 mL
  Filled 2016-09-16: qty 100

## 2016-09-16 MED ORDER — SODIUM CHLORIDE 0.9 % IV BOLUS (SEPSIS)
1000.0000 mL | Freq: Once | INTRAVENOUS | Status: AC
  Administered 2016-09-16: 1000 mL via INTRAVENOUS

## 2016-09-16 MED ORDER — PANTOPRAZOLE SODIUM 40 MG PO TBEC: 40.0000 mg | DELAYED_RELEASE_TABLET | Freq: Every day | ORAL | 0 refills | Status: DC

## 2016-09-16 MED ORDER — ONDANSETRON 4 MG PO TBDP: 4.0000 mg | ORAL_TABLET | Freq: Three times a day (TID) | ORAL | 0 refills | Status: DC | PRN

## 2016-09-16 NOTE — ED Notes (Signed)
Pt being taken to CT.

## 2016-09-16 NOTE — ED Provider Notes (Signed)
[facility]-EMERGENCY DEPT Provider Note   CSN: [id number] Arrival date & time: 09/16/16  0940     History   Chief Complaint Chief Complaint  Patient presents with  . Chest Pain  . Emesis    HPI William Hunter is a 71 y.o. male.  HPI  71 year old male presents with chest pain and vomiting. History taken from patient and wife. This is been an ongoing issue on and off since November around Thanksgiving. He will get flares of chest pain, vomiting, and diarrhea. Chest pain is lower chest but also upper abdomen. He is also holding his lower abdomen saying that hurts as well. All of these feel like aching sensations. He has been unable to keep any fluids down. Vomiting for 4 days. Multiple episodes of loose stools. One time yesterday he had some blood in his vomit, none since. Also has a daily headache for several years but seems to be low but worse. Chest pain feels like an aching sensation. Some short of breath yesterday but none now. All of these symptoms feel similar to prior flares of this but worse. He does not have a diagnosis at this time.  Past Medical History:  Diagnosis Date  . Anginal pain ([name]   . Arthritis    "fingers" (03/09/2012)  . Cold feet    "from my diabetes; they stay cold" (03/09/2012)  . Coronary artery disease   . Coronary artery spasm [facility])    since age 46 Dr Aleen Campi  . Dyslipidemia   . Headache(784.0)   . Hx of gallstones    Dr Derrell Lolling lap cholecystectomy  . Hx of plastic surgery    plastic surgery following a fall off a loading dock ,lost  all his upper teeth   . Hypertension   . MI, old    at age 78  . Migraines   . Pneumonia ~ 1962; 1970's   "double"; "regular" (03/09/2012)  . Rheumatoid arthritis ([name]    Dr Dierdre Forth   . Slipped cervical disc 2008   Dr Patsi Sears in the past/ Dr Larita Fife , chiropractor in 2008  . Thyroid disease    Dr Lucianne Muss  . Type II diabetes mellitus [facility])     Patient Active Problem List   Diagnosis Date Noted  . Migraine with aura  and without status migrainosus, not intractable 09/02/2016  . Diabetic polyneuropathy associated with type 2 diabetes mellitus ([name] 09/02/2016  . Rheumatoid arthritis ([name] 09/02/2016  . Coronary vasospasm ([name] 04/06/2014  . Gallstones 03/10/2012  . Abdominal pain 03/09/2012  . MI, old   . Coronary artery disease   . Hypertension   . Dyslipidemia     Past Surgical History:  Procedure Laterality Date  . CARDIAC CATHETERIZATION     x 3 with NO stents   . CHOLECYSTECTOMY  03/11/2012   Procedure: LAPAROSCOPIC CHOLECYSTECTOMY WITH INTRAOPERATIVE CHOLANGIOGRAM;  Surgeon: Ernestene Mention, MD;  Location: [facility] OR;  Service: General;  Laterality: N/A;  . PENILE PROSTHESIS IMPLANT  1983       Home Medications    Prior to Admission medications   Medication Sig Start Date End Date Taking? Authorizing Provider  acetaminophen (TYLENOL) 500 MG tablet Take 1,500 mg by mouth 3 (three) times daily as needed for headache (pain).   Yes Historical Provider, MD  atorvastatin (LIPITOR) 40 MG tablet Take 1 tablet (40 mg total) by mouth daily. 07/19/13  Yes Jake Bathe, MD  benazepril (LOTENSIN) 10 MG tablet Take 10 mg by mouth daily.  05/11/15  Yes Historical Provider, MD  diltiazem (TAZTIA XT) 300 MG 24 hr capsule Take 1 capsule (300 mg total) by mouth daily. 04/27/15  Yes Jake Bathe, MD  famotidine (PEPCID) 20 MG tablet Take 40 mg by mouth 2 (two) times daily.   Yes Historical Provider, MD  glimepiride (AMARYL) 4 MG tablet Take 4 mg by mouth daily before breakfast.   Yes Historical Provider, MD  metFORMIN (GLUCOPHAGE) 500 MG tablet Take 500 mg by mouth 3 (three) times daily.  04/06/16  Yes Historical Provider, MD  PRESCRIPTION MEDICATION Take 2 tablets by mouth daily with supper. Double blind diabetes study through [facility] (Dr. Juleen China).   Yes Historical Provider, MD  rizatriptan (MAXALT-MLT) 10 MG disintegrating tablet Take 1 tablet (10 mg total) by mouth as needed for migraine. May repeat  in 2 hours if needed 09/02/16  Yes Suanne Marker, MD  sulfaSALAzine (AZULFIDINE) 500 MG tablet Take 1,000 mg by mouth 2 (two) times daily.  09/01/16  Yes Historical Provider, MD  topiramate (TOPAMAX) 50 MG tablet Take 1 tablet (50 mg total) by mouth 2 (two) times daily. 09/02/16  Yes Suanne Marker, MD  ondansetron (ZOFRAN ODT) 4 MG disintegrating tablet Take 1 tablet (4 mg total) by mouth every 8 (eight) hours as needed for nausea or vomiting. 09/16/16   Pricilla Loveless, MD  pantoprazole (PROTONIX) 40 MG tablet Take 1 tablet (40 mg total) by mouth daily. 09/16/16   Pricilla Loveless, MD    Family History Family History  Problem Relation Age of Onset  . Heart failure Mother   . Cancer - Other Mother     breast  . Heart disease Father   . Cancer - Other Sister     tongue cancer  . Heart attack Sister     Social History Social History  Substance Use Topics  . Smoking status: Never Smoker  . Smokeless tobacco: Never Used  . Alcohol use No     Allergies   Codeine; Penicillins; Prednisone; and Wool alcohol [lanolin]   Review of Systems Review of Systems  Respiratory: Negative for shortness of breath.   Cardiovascular: Positive for chest pain.  Gastrointestinal: Positive for abdominal pain, diarrhea, nausea and vomiting.  Neurological: Positive for headaches. Negative for weakness and numbness.  All other systems reviewed and are negative.    Physical Exam Updated Vital Signs BP (!) 105/56   Pulse 76   Temp 98.7 F (37.1 C) (Oral)   Resp 20   Ht 6\' 1"  (1.854 m)   Wt 206 lb 12.8 oz (93.8 kg)   SpO2 99%   BMI 27.28 kg/m   Physical Exam  Constitutional: He is oriented to person, place, and time. He appears well-developed and well-nourished.  HENT:  Head: Normocephalic and atraumatic.  Right Ear: External ear normal.  Left Ear: External ear normal.  Nose: Nose normal.  Eyes: EOM are normal. Pupils are equal, round, and reactive to light. Right eye exhibits no  discharge. Left eye exhibits no discharge.  Neck: Normal range of motion. Neck supple.  Cardiovascular: Normal rate, regular rhythm and normal heart sounds.   Pulmonary/Chest: Effort normal and breath sounds normal. He has no wheezes. He has no rales.  Abdominal: Soft. There is tenderness (mild, diffuse).  Musculoskeletal: He exhibits no edema.  Neurological: He is alert and oriented to person, place, and time.  CN 3-12 grossly intact. 5/5 strength in all 4 extremities. Grossly normal sensation. Normal finger to nose.   Skin: Skin is  warm and dry. He is not diaphoretic.  Nursing note and vitals reviewed.    ED Treatments / Results  Labs (all labs ordered are listed, but only abnormal results are displayed) Labs Reviewed  BASIC METABOLIC PANEL - Abnormal; Notable for the following:       Result Value   Sodium 134 (*)    CO2 21 (*)    Glucose, Bld 160 (*)    All other components within normal limits  CBC - Abnormal; Notable for the following:    WBC 12.1 (*)    All other components within normal limits  LIPASE, BLOOD  HEPATIC FUNCTION PANEL  I-STAT TROPOININ, ED    EKG  EKG Interpretation  Date/Time:  Monday September 16 2016 09:52:19 EDT Ventricular Rate:  105 PR Interval:  156 QRS Duration: 78 QT Interval:  330 QTC Calculation: 436 R Axis:   68 Text Interpretation:  Sinus tachycardia Cannot rule out Anterior infarct , age undetermined Abnormal ECG nonspecific Inferior T waves new since Nov 2017 Confirmed by Criss Alvine MD, Sahith Nurse [phone]) on 09/16/2016 1:38:49 PM       Radiology Dg Chest 2 View  Result Date: 09/16/2016 CLINICAL DATA:  Bilateral chest pain, intermittent shortness of breath for several months, headache EXAM: CHEST  2 VIEW COMPARISON:  05/02/2016 FINDINGS: Cardiomediastinal silhouette is stable. No infiltrate or pleural effusion. No pulmonary edema. Mild degenerative changes mid and lower thoracic spine. IMPRESSION: No active cardiopulmonary disease. Mild  degenerative changes mid and lower thoracic spine. Electronically Signed   By: Natasha Mead M.D.   On: 09/16/2016 10:30   Ct Head Wo Contrast  Result Date: 09/16/2016 CLINICAL DATA:  71 year old male with acute headache. EXAM: CT HEAD WITHOUT CONTRAST TECHNIQUE: Contiguous axial images were obtained from the base of the skull through the vertex without intravenous contrast. COMPARISON:  06/07/2016. FINDINGS: Brain: Mild cerebral atrophy with some associated ex vacuo dilatation of the ventricular system, similar to the prior study. Patchy and confluent areas of decreased attenuation are noted throughout the deep and periventricular white matter of the cerebral hemispheres bilaterally, compatible with chronic microvascular ischemic disease. No evidence of acute infarction, hemorrhage, extra-axial collection or mass lesion/mass effect. Vascular: No hyperdense vessel or unexpected calcification. Skull: Normal. Negative for fracture or focal lesion. Sinuses/Orbits: No acute finding. Other: None. IMPRESSION: 1. There continues to be some very mild cerebral atrophy with extensive chronic microvascular ischemic changes in the cerebral white matter and some associated ex vacuo dilatation of the ventricular system. As mentioned on the prior study, the possibility of normal pressure hydrocephalus (NPH) is not entirely excluded. 2. No other definite acute intracranial abnormality. Electronically Signed   By: Trudie Reed M.D.   On: 09/16/2016 15:09   Ct Abdomen Pelvis W Contrast  Result Date: 09/16/2016 CLINICAL DATA:  Initial evaluation for acute abdominal pain, vomiting. EXAM: CT ABDOMEN AND PELVIS WITH CONTRAST TECHNIQUE: Multidetector CT imaging of the abdomen and pelvis was performed using the standard protocol following bolus administration of intravenous contrast. CONTRAST:  ISOVUE-300 IOPAMIDOL (ISOVUE-300) INJECTION 61% COMPARISON:  None. FINDINGS: Lower chest: Mild bibasilar atelectatic changes present.  Visualized lung bases otherwise clear. Hepatobiliary: Liver within normal limits. Gallbladder surgically absent. No biliary dilatation. Pancreas: Pancreas within normal limits. Spleen: Spleen within normal limits. Adrenals/Urinary Tract: Adrenal glands are normal. Kidneys equal in size with symmetric enhancement. Small cysts noted within the interpolar right kidney. No nephrolithiasis, hydronephrosis, or focal enhancing renal mass. No hydroureter. Bladder within normal limits. Stomach/Bowel: Stomach within normal  limits. No evidence for bowel obstruction. Appendix normal. No acute inflammatory changes seen about the bowels. Vascular/Lymphatic: Normal intravascular enhancement seen throughout the intra-abdominal aorta and its branch vessels. Moderate aorto bi-iliac atherosclerotic disease. No aneurysm. No adenopathy. Reproductive: Prostate normal.  Penile implant noted. Other: No free air or fluid. Musculoskeletal: No acute osseous abnormality. No worrisome lytic or blastic osseous lesions. Degenerative osteoarthrosis about the hips bilaterally. Remotely healed right posterior rib fractures noted. The IMPRESSION: 1. No CT evidence for acute intra-abdominal or pelvic process. 2. Status post cholecystectomy. 3. Moderate aorto bi-iliac atherosclerotic disease. Electronically Signed   By: Rise Mu M.D.   On: 09/16/2016 15:18    Procedures Procedures (including critical care time)  Medications Ordered in ED Medications  fentaNYL (SUBLIMAZE) injection 50 mcg (50 mcg Intravenous Given 09/16/16 1406)  ondansetron (ZOFRAN) injection 4 mg (4 mg Intravenous Given 09/16/16 1407)  sodium chloride 0.9 % bolus 1,000 mL (0 mLs Intravenous Stopped 09/16/16 1550)  iopamidol (ISOVUE-300) 61 % injection (100 mLs  Contrast Given 09/16/16 1438)     Initial Impression / Assessment and Plan / ED Course  I have reviewed the triage vital signs and the nursing notes.  Pertinent labs & imaging results that were available  during my care of the patient were reviewed by me and considered in my medical decision making (see chart for details).     No vomiting on arrival to the room. He vomited once in the waiting room. After Zofran he feels much better. Unclear cause of his symptoms but these have been on and off for several months. My suspicion of ACS is very low. He has nonspecific T waves but no frank T-wave inversions or ST segment changes. Seems more abdominal to me. Patient is feeling much better. He does not note any specific pain with eating but this is probably more gastric than cardiac. He vomited small blood one time, likely mallory weiss rather than true GI bleed. Hgb 15.  Chronic headache of unclear etiology. CT unchanged since December. He is already lined up for an MRI. At this point I feel he is stable for discharge for outpatient follow-up with his PCP. Appears stable for discharge. Discussed return precautions.   Final Clinical Impressions(s) / ED Diagnoses   Final diagnoses:  Nausea vomiting and diarrhea  Generalized abdominal pain    New Prescriptions Discharge Medication List as of 09/16/2016  3:35 PM    START taking these medications   Details  ondansetron (ZOFRAN ODT) 4 MG disintegrating tablet Take 1 tablet (4 mg total) by mouth every 8 (eight) hours as needed for nausea or vomiting., Starting Mon 09/16/2016, Print    pantoprazole (PROTONIX) 40 MG tablet Take 1 tablet (40 mg total) by mouth daily., Starting Mon 09/16/2016, Print         Pricilla Loveless, MD 09/16/16 [id number]

## 2016-09-16 NOTE — ED Triage Notes (Signed)
Pt has been having CP off and on and emesis for several months. Pt has been seeing his primary MD with no relief.

## 2016-09-16 NOTE — ED Notes (Signed)
Pt has had one emesis episode in lobby while waiting

## 2016-09-18 DIAGNOSIS — R109 Unspecified abdominal pain: Secondary | ICD-10-CM | POA: Diagnosis not present

## 2016-09-23 ENCOUNTER — Ambulatory Visit
Admission: RE | Admit: 2016-09-23 | Discharge: 2016-09-23 | Disposition: A | Discharge status: Home / Self Care | Payer: Medicare HMO | Source: Ambulatory Visit | Attending: Diagnostic Neuroimaging | Admitting: Diagnostic Neuroimaging

## 2016-09-23 DIAGNOSIS — R55 Syncope and collapse: Secondary | ICD-10-CM

## 2016-09-23 DIAGNOSIS — R930 Abnormal findings on diagnostic imaging of skull and head, not elsewhere classified: Secondary | ICD-10-CM

## 2016-09-23 DIAGNOSIS — R0683 Snoring: Secondary | ICD-10-CM

## 2016-10-03 DIAGNOSIS — Z1211 Encounter for screening for malignant neoplasm of colon: Secondary | ICD-10-CM | POA: Diagnosis not present

## 2016-10-03 DIAGNOSIS — K920 Hematemesis: Secondary | ICD-10-CM | POA: Diagnosis not present

## 2016-10-03 DIAGNOSIS — R112 Nausea with vomiting, unspecified: Secondary | ICD-10-CM | POA: Diagnosis not present

## 2016-10-03 DIAGNOSIS — R197 Diarrhea, unspecified: Secondary | ICD-10-CM | POA: Diagnosis not present

## 2016-10-07 DIAGNOSIS — R197 Diarrhea, unspecified: Secondary | ICD-10-CM | POA: Diagnosis not present

## 2016-10-15 DIAGNOSIS — K920 Hematemesis: Secondary | ICD-10-CM | POA: Diagnosis not present

## 2016-10-15 DIAGNOSIS — R197 Diarrhea, unspecified: Secondary | ICD-10-CM | POA: Diagnosis not present

## 2016-10-15 DIAGNOSIS — K2930 Chronic superficial gastritis without bleeding: Secondary | ICD-10-CM | POA: Diagnosis not present

## 2016-10-15 DIAGNOSIS — K449 Diaphragmatic hernia without obstruction or gangrene: Secondary | ICD-10-CM | POA: Diagnosis not present

## 2016-10-15 DIAGNOSIS — K219 Gastro-esophageal reflux disease without esophagitis: Secondary | ICD-10-CM | POA: Diagnosis not present

## 2016-10-15 DIAGNOSIS — R112 Nausea with vomiting, unspecified: Secondary | ICD-10-CM | POA: Diagnosis not present

## 2016-10-18 ENCOUNTER — Ambulatory Visit (INDEPENDENT_AMBULATORY_CARE_PROVIDER_SITE_OTHER): Payer: Medicare HMO | Admitting: Diagnostic Neuroimaging

## 2016-10-18 ENCOUNTER — Encounter (INDEPENDENT_AMBULATORY_CARE_PROVIDER_SITE_OTHER): Payer: Self-pay

## 2016-10-18 ENCOUNTER — Encounter: Payer: Self-pay | Admitting: Diagnostic Neuroimaging

## 2016-10-18 VITALS — BP 139/84 | HR 98 | Ht 73.0 in | Wt 209.0 lb

## 2016-10-18 DIAGNOSIS — R0683 Snoring: Secondary | ICD-10-CM | POA: Diagnosis not present

## 2016-10-18 DIAGNOSIS — R930 Abnormal findings on diagnostic imaging of skull and head, not elsewhere classified: Secondary | ICD-10-CM

## 2016-10-18 DIAGNOSIS — E1142 Type 2 diabetes mellitus with diabetic polyneuropathy: Secondary | ICD-10-CM

## 2016-10-18 DIAGNOSIS — G43109 Migraine with aura, not intractable, without status migrainosus: Secondary | ICD-10-CM

## 2016-10-18 DIAGNOSIS — R519 Headache, unspecified: Secondary | ICD-10-CM

## 2016-10-18 DIAGNOSIS — R51 Headache: Secondary | ICD-10-CM

## 2016-10-18 DIAGNOSIS — M069 Rheumatoid arthritis, unspecified: Secondary | ICD-10-CM | POA: Diagnosis not present

## 2016-10-18 NOTE — Progress Notes (Signed)
[facility]  PATIENT: William Hunter DOB: 1945/08/28  REFERRING CLINICIAN: Misty Stanley HISTORY FROM: patient REASON FOR VISIT: follow up   HISTORICAL  CHIEF COMPLAINT:  Chief Complaint  Patient presents with  . Follow-up    Rm 7, alone  . Migraine    having daily headaches, meds are not helping    HISTORY OF PRESENT ILLNESS:   UPDATE 10/18/16: Since last visit, doing about the same. Continues with daily headaches ("going on for years"). Since last visit, tried and failed topiramate (3 weeks) and rizatriptan (took 1 dose only). Now back on tramadol. Sleep study pending. Cannot have MRI due to metal penile implant.   PRIOR HPI (09/02/16): 71 year old left-handed male here for evaluation of headaches. For past 1-2 years patient has had daily headaches associated with photophobia, blurred vision seeing spots. 2 times a month he has severe headaches and has to lay down in a dark room. Patient has history of headaches since he was a child, consisting of pressure, pounding and throbbing headaches, from front to back, midline, associated with nausea, vomiting and photophobia. He was never officially diagnosed with migraine headaches. He would have these headaches 2-3 times per week. Over the years she had number of days where he would miss school or work. The longest time he was ever headache free in his entire life was 1 month. Patient presented to PCP recently, and due to severe headaches had CT scan of the head which showed ventriculomegaly and raise possibility of normal pressure hydrocephalus. Patient referred to me for further evaluation. No other alleviating or aggravating factors. Patient takes some Tylenol or ibuprofen as needed for headaches. Patient also had episode of passing out recently, where he felt hot and sweaty, standing in line at the cafeteria. No convulsions, postictal confusion, tongue biting or incontinence.   REVIEW OF SYSTEMS: Full 14 system review of  systems performed and negative with exception of: only as per HPI.   ALLERGIES: Allergies  Allergen Reactions  . Codeine Swelling  . Penicillins Hives and Swelling    Has patient had a PCN reaction causing immediate rash, facial/tongue/throat swelling, SOB or lightheadedness with hypotension: Yes Has patient had a PCN reaction causing severe rash involving mucus membranes or skin necrosis: No Has patient had a PCN reaction that required hospitalization No Has patient had a PCN reaction occurring within the last 10 years: No If all of the above answers are "NO", then may proceed with Cephalosporin use.   . Prednisone Hives  . Wool Alcohol [Lanolin] Hives    HOME MEDICATIONS: Outpatient Medications Prior to Visit  Medication Sig Dispense Refill  . acetaminophen (TYLENOL) 500 MG tablet Take 1,500 mg by mouth 3 (three) times daily as needed for headache (pain).    Marland Kitchen atorvastatin (LIPITOR) 40 MG tablet Take 1 tablet (40 mg total) by mouth daily. 30 tablet 3  . benazepril (LOTENSIN) 10 MG tablet Take 10 mg by mouth daily.     Marland Kitchen diltiazem (TAZTIA XT) 300 MG 24 hr capsule Take 1 capsule (300 mg total) by mouth daily. 30 capsule 11  . famotidine (PEPCID) 20 MG tablet Take 40 mg by mouth 2 (two) times daily.    Marland Kitchen glimepiride (AMARYL) 4 MG tablet Take 4 mg by mouth daily before breakfast.    . metFORMIN (GLUCOPHAGE) 500 MG tablet Take 500 mg by mouth 3 (three) times daily.     . ondansetron (ZOFRAN ODT) 4 MG disintegrating tablet Take 1 tablet (4 mg total)  by mouth every 8 (eight) hours as needed for nausea or vomiting. 8 tablet 0  . pantoprazole (PROTONIX) 40 MG tablet Take 1 tablet (40 mg total) by mouth daily. 30 tablet 0  . PRESCRIPTION MEDICATION Take 2 tablets by mouth daily with supper. Double blind diabetes study through [facility] (Dr. Kohut).    . rizatriptan (MAXALT-MLT) 10 MG disintegrating tablet Take 1 tablet (10 mg total) by mouth as needed for migraine. May repeat in 2  hours if needed 9 tablet 11  . sulfaSALAzine (AZULFIDINE) 500 MG tablet Take 1,000 mg by mouth 2 (two) times daily.     . topiramate (TOPAMAX) 50 MG tablet Take 1 tablet (50 mg total) by mouth 2 (two) times daily. 60 tablet 12   No facility-administered medications prior to visit.     PAST MEDICAL HISTORY: Past Medical History:  Diagnosis Date  . Anginal pain ([name]   . Arthritis    "fingers" (03/09/2012)  . Cold feet    "from my diabetes; they stay cold" (03/09/2012)  . Coronary artery disease   . Coronary artery spasm ([name]    since age 33 Dr Tysinger  . Dyslipidemia   . Headache(784.0)   . Hx of gallstones    Dr Ingram lap cholecystectomy  . Hx of plastic surgery    plastic surgery following a fall off a loading dock ,lost  all his upper teeth   . Hypertension   . MI, old    at age 34  . Migraines   . Pneumonia ~ 1962; 1970's   "double"; "regular" (03/09/2012)  . Rheumatoid arthritis ([name]    Dr Beekman   . Slipped cervical disc 2008   Dr Tannenbaum in the past/ Dr Speller , chiropractor in 2008  . Thyroid disease    Dr kumar  . Type II diabetes mellitus ([name]     PAST SURGICAL HISTORY: Past Surgical History:  Procedure Laterality Date  . CARDIAC CATHETERIZATION     x 3 with NO stents   . CHOLECYSTECTOMY  03/11/2012   Procedure: LAPAROSCOPIC CHOLECYSTECTOMY WITH INTRAOPERATIVE CHOLANGIOGRAM;  Surgeon: Haywood M Ingram, MD;  Location: [facility] OR;  Service: General;  Laterality: N/A;  . PENILE PROSTHESIS IMPLANT  1983    FAMILY HISTORY: Family History  Problem Relation Age of Onset  . Heart failure Mother   . Cancer - Other Mother        breast  . Heart disease Father   . Cancer - Other Sister        tongue cancer  . Heart attack Sister     SOCIAL HISTORY:  Social History   Social History  . Marital status: Married    Spouse name: Shirley  . Number of children: 0  . Years of education: 14   Occupational History  .      [employer]   Social  History Main Topics  . Smoking status: Never Smoker  . Smokeless tobacco: Never Used  . Alcohol use No  . Drug use: No  . Sexual activity: Yes   Other Topics Concern  . Not on file   Social History Narrative   Lives with wife   Caffeine- Diet Coke 5 daily     PHYSICAL EXAM  GENERAL EXAM/CONSTITUTIONAL: Vitals:  Vitals:   10/18/16 0748  BP: 139/84  Pulse: 98  Weight: 209 lb (94.8 kg)  Height: 6' 1" (1.854 m)   Body mass index is 27.57 kg/m. No exam data present  Patient is   in no distress; well developed, nourished and groomed; neck is supple  CARDIOVASCULAR:  Examination of carotid arteries is normal; no carotid bruits  Regular rate and rhythm, no murmurs  Examination of peripheral vascular system by observation and palpation is normal  EYES:  Ophthalmoscopic exam of optic discs and posterior segments is normal; no papilledema or hemorrhages  MUSCULOSKELETAL:  Gait, strength, tone, movements noted in Neurologic exam below  NEUROLOGIC: MENTAL STATUS:  No flowsheet data found.  awake, alert, oriented to person, place and time  recent and remote memory intact  normal attention and concentration  language fluent, comprehension intact, naming intact,   fund of knowledge appropriate  CRANIAL NERVE:   2nd - no papilledema on fundoscopic exam  2nd, 3rd, 4th, 6th - pupils equal and reactive to light, visual fields full to confrontation, extraocular muscles intact, no nystagmus  5th - facial sensation symmetric  7th - facial strength symmetric  8th - hearing intact  9th - palate elevates symmetrically, uvula midline  11th - shoulder shrug symmetric  12th - tongue protrusion midline  MOTOR:   normal bulk and tone, full strength in the BUE, BLE  SENSORY:   normal and symmetric to light touch, temperature  DECR VIBRATION AT TOES  COORDINATION:   finger-nose-finger, fine finger movements normal  REFLEXES:   deep tendon reflexes TRACE  and symmetric  GAIT/STATION:   narrow based gait; SLIGHTLY CAUTIOUS; ABLE TO TANDEM    DIAGNOSTIC DATA (LABS, IMAGING, TESTING) - I reviewed patient records, labs, notes, testing and imaging myself where available.  Lab Results  Component Value Date   WBC 12.1 (H) 09/16/2016   HGB 15.9 09/16/2016   HCT 46.9 09/16/2016   MCV 88.5 09/16/2016   PLT 294 09/16/2016      Component Value Date/Time   NA 134 (L) 09/16/2016 1009   K 3.7 09/16/2016 1009   CL 102 09/16/2016 1009   CO2 21 (L) 09/16/2016 1009   GLUCOSE 160 (H) 09/16/2016 1009   BUN 19 09/16/2016 1009   CREATININE 1.03 09/16/2016 1009   CALCIUM 9.4 09/16/2016 1009   PROT 8.0 09/16/2016 1400   ALBUMIN 4.5 09/16/2016 1400   AST 22 09/16/2016 1400   ALT 21 09/16/2016 1400   ALKPHOS 69 09/16/2016 1400   BILITOT 0.8 09/16/2016 1400   GFRNONAA >60 09/16/2016 1009   GFRAA >60 09/16/2016 1009   Lab Results  Component Value Date   CHOL 140 09/16/2013   HDL 36.50 (L) 09/16/2013   LDLCALC 80 09/16/2013   TRIG 117.0 09/16/2013   CHOLHDL 4 09/16/2013   Lab Results  Component Value Date   HGBA1C 8.7 (H) 03/09/2012   No results found for: VITAMINB12 Lab Results  Component Value Date   TSH 0.221 Test methodology is 3rd generation TSH (L) 08/06/2007    09/27/15 ESR 17  07/18/16 A1c 6.9   06/07/16 CT head [I reviewed images myself and agree with interpretation. -VRP]  - Moderate ventricular enlargement with significant progression since 2006. There is also progression of atrophy. Ventricular enlargement may be due to atrophy however consider NPH. - Chronic microvascular ischemic change in the white matter. No acute infarct or mass.  09/16/16 CT head [I reviewed images myself and agree with interpretation. -VRP]  1. There continues to be some very mild cerebral atrophy with extensive chronic microvascular ischemic changes in the cerebral white matter and some associated ex vacuo dilatation of the ventricular system. As  mentioned on the prior study, the possibility of normal  pressure hydrocephalus (NPH) is not entirely excluded. 2. No other definite acute intracranial abnormality.     ASSESSMENT AND PLAN  71 y.o. year old male here with Lifelong history of headaches since childhood, most consistent with migraine with aura. Also with change in headache pattern and severity in the last 1-2 years, which may represent change in migraine versus other secondary cause. Also with abnormal CT scan of the head, most likely related to subcortical atrophy rather than normal pressure hydrocephalus.   Ddx: migraine headaches + chronic daily headache + subcortical brain atrophy (due to chronic small vessel ischemic disease)  1. Migraine with aura and without status migrainosus, not intractable   2. Chronic daily headache   3. Snoring   4. Abnormal CT of the head   5. Diabetic polyneuropathy associated with type 2 diabetes mellitus ([place])   6. Rheumatoid arthritis, involving unspecified site, unspecified rheumatoid factor presence ([place])      PLAN:  CHANGE IN HEADACHE PATTERN (increasing severity and frequency) - CT head was stable. Cannot have MRI due to metal implant. - headaches are back to stable pattern --> chronic daily headache   MIGRAINE WITH AURA (since childhood) - migraine prevention: tried and failed topiramate  - migraine rescue: tried and failed rizatriptan - continues on tramadol per PCP  ABNORMAL CT HEAD  - no definite clinical signs of NPH  SNORING/EXCESSIVE DAYTIME SLEEPINESS - check sleep study (snoring, excessive daytime sleepiness, headaches)  SYNCOPE (suspect vasovagal event which overheated, standing in line) - monitor BP at home - stay hydrated - follow up with PCP and consider cardiac workup  Return if symptoms worsen or fail to improve, for return to PCP.   Penni Bombard, MD 9/50/9326, 7:12 AM Certified in Neurology, Neurophysiology and Neuroimaging  [facility]  [facility] [address], [place], [place] [address] [phone]

## 2016-10-21 DIAGNOSIS — K2930 Chronic superficial gastritis without bleeding: Secondary | ICD-10-CM | POA: Diagnosis not present

## 2016-10-21 DIAGNOSIS — K219 Gastro-esophageal reflux disease without esophagitis: Secondary | ICD-10-CM | POA: Diagnosis not present

## 2016-11-19 DIAGNOSIS — R51 Headache: Secondary | ICD-10-CM | POA: Diagnosis not present

## 2016-11-19 DIAGNOSIS — I10 Essential (primary) hypertension: Secondary | ICD-10-CM | POA: Diagnosis not present

## 2016-11-19 DIAGNOSIS — E118 Type 2 diabetes mellitus with unspecified complications: Secondary | ICD-10-CM | POA: Diagnosis not present

## 2016-12-12 DIAGNOSIS — Z79899 Other long term (current) drug therapy: Secondary | ICD-10-CM | POA: Diagnosis not present

## 2016-12-12 DIAGNOSIS — M150 Primary generalized (osteo)arthritis: Secondary | ICD-10-CM | POA: Diagnosis not present

## 2016-12-12 DIAGNOSIS — M0589 Other rheumatoid arthritis with rheumatoid factor of multiple sites: Secondary | ICD-10-CM | POA: Diagnosis not present

## 2016-12-12 DIAGNOSIS — R5383 Other fatigue: Secondary | ICD-10-CM | POA: Diagnosis not present

## 2016-12-23 ENCOUNTER — Ambulatory Visit: Payer: Medicare HMO | Admitting: Diagnostic Neuroimaging

## 2017-01-09 DIAGNOSIS — M5033 Other cervical disc degeneration, cervicothoracic region: Secondary | ICD-10-CM | POA: Diagnosis not present

## 2017-01-09 DIAGNOSIS — M9901 Segmental and somatic dysfunction of cervical region: Secondary | ICD-10-CM | POA: Diagnosis not present

## 2017-01-20 DIAGNOSIS — E119 Type 2 diabetes mellitus without complications: Secondary | ICD-10-CM | POA: Diagnosis not present

## 2017-01-20 DIAGNOSIS — E139 Other specified diabetes mellitus without complications: Secondary | ICD-10-CM | POA: Diagnosis not present

## 2017-02-17 ENCOUNTER — Other Ambulatory Visit: Payer: Self-pay | Admitting: *Deleted

## 2017-02-17 MED ORDER — DILTIAZEM HCL ER BEADS 300 MG PO CP24: 300.0000 mg | ORAL_CAPSULE | Freq: Every day | ORAL | 11 refills | Status: DC

## 2017-03-20 DIAGNOSIS — E118 Type 2 diabetes mellitus with unspecified complications: Secondary | ICD-10-CM | POA: Diagnosis not present

## 2017-03-20 DIAGNOSIS — Z125 Encounter for screening for malignant neoplasm of prostate: Secondary | ICD-10-CM | POA: Diagnosis not present

## 2017-03-20 DIAGNOSIS — I10 Essential (primary) hypertension: Secondary | ICD-10-CM | POA: Diagnosis not present

## 2017-03-20 DIAGNOSIS — E789 Disorder of lipoprotein metabolism, unspecified: Secondary | ICD-10-CM | POA: Diagnosis not present

## 2017-03-20 DIAGNOSIS — Z0000 Encounter for general adult medical examination without abnormal findings: Secondary | ICD-10-CM | POA: Diagnosis not present

## 2017-04-21 ENCOUNTER — Telehealth: Payer: Self-pay | Admitting: Cardiology

## 2017-05-21 DIAGNOSIS — E119 Type 2 diabetes mellitus without complications: Secondary | ICD-10-CM | POA: Diagnosis not present

## 2017-06-23 DIAGNOSIS — M5033 Other cervical disc degeneration, cervicothoracic region: Secondary | ICD-10-CM | POA: Diagnosis not present

## 2017-06-23 DIAGNOSIS — M9901 Segmental and somatic dysfunction of cervical region: Secondary | ICD-10-CM | POA: Diagnosis not present

## 2017-07-15 DIAGNOSIS — I10 Essential (primary) hypertension: Secondary | ICD-10-CM | POA: Diagnosis not present

## 2017-07-15 DIAGNOSIS — Z0000 Encounter for general adult medical examination without abnormal findings: Secondary | ICD-10-CM | POA: Diagnosis not present

## 2017-07-15 DIAGNOSIS — E119 Type 2 diabetes mellitus without complications: Secondary | ICD-10-CM | POA: Diagnosis not present

## 2017-07-15 DIAGNOSIS — Z125 Encounter for screening for malignant neoplasm of prostate: Secondary | ICD-10-CM | POA: Diagnosis not present

## 2017-07-15 DIAGNOSIS — E118 Type 2 diabetes mellitus with unspecified complications: Secondary | ICD-10-CM | POA: Diagnosis not present

## 2017-07-22 DIAGNOSIS — E119 Type 2 diabetes mellitus without complications: Secondary | ICD-10-CM | POA: Diagnosis not present

## 2017-07-22 DIAGNOSIS — Z0000 Encounter for general adult medical examination without abnormal findings: Secondary | ICD-10-CM | POA: Diagnosis not present

## 2017-07-22 DIAGNOSIS — R634 Abnormal weight loss: Secondary | ICD-10-CM | POA: Diagnosis not present

## 2017-07-23 DIAGNOSIS — E118 Type 2 diabetes mellitus with unspecified complications: Secondary | ICD-10-CM | POA: Diagnosis not present

## 2017-07-23 DIAGNOSIS — E7800 Pure hypercholesterolemia, unspecified: Secondary | ICD-10-CM | POA: Diagnosis not present

## 2017-07-23 DIAGNOSIS — I10 Essential (primary) hypertension: Secondary | ICD-10-CM | POA: Diagnosis not present

## 2017-07-23 DIAGNOSIS — R809 Proteinuria, unspecified: Secondary | ICD-10-CM | POA: Diagnosis not present

## 2017-07-23 DIAGNOSIS — I2510 Atherosclerotic heart disease of native coronary artery without angina pectoris: Secondary | ICD-10-CM | POA: Diagnosis not present

## 2017-07-23 DIAGNOSIS — R7989 Other specified abnormal findings of blood chemistry: Secondary | ICD-10-CM | POA: Diagnosis not present

## 2017-08-04 DIAGNOSIS — R112 Nausea with vomiting, unspecified: Secondary | ICD-10-CM | POA: Diagnosis not present

## 2017-08-20 DIAGNOSIS — E7800 Pure hypercholesterolemia, unspecified: Secondary | ICD-10-CM | POA: Diagnosis not present

## 2017-08-20 DIAGNOSIS — R7989 Other specified abnormal findings of blood chemistry: Secondary | ICD-10-CM | POA: Diagnosis not present

## 2017-08-20 DIAGNOSIS — R809 Proteinuria, unspecified: Secondary | ICD-10-CM | POA: Diagnosis not present

## 2017-08-20 DIAGNOSIS — E118 Type 2 diabetes mellitus with unspecified complications: Secondary | ICD-10-CM | POA: Diagnosis not present

## 2017-08-20 DIAGNOSIS — I10 Essential (primary) hypertension: Secondary | ICD-10-CM | POA: Diagnosis not present

## 2017-08-20 DIAGNOSIS — I2510 Atherosclerotic heart disease of native coronary artery without angina pectoris: Secondary | ICD-10-CM | POA: Diagnosis not present

## 2017-10-13 DIAGNOSIS — R7989 Other specified abnormal findings of blood chemistry: Secondary | ICD-10-CM | POA: Diagnosis not present

## 2017-10-13 DIAGNOSIS — E118 Type 2 diabetes mellitus with unspecified complications: Secondary | ICD-10-CM | POA: Diagnosis not present

## 2017-10-20 DIAGNOSIS — I2510 Atherosclerotic heart disease of native coronary artery without angina pectoris: Secondary | ICD-10-CM | POA: Diagnosis not present

## 2017-10-20 DIAGNOSIS — I10 Essential (primary) hypertension: Secondary | ICD-10-CM | POA: Diagnosis not present

## 2017-10-20 DIAGNOSIS — E7800 Pure hypercholesterolemia, unspecified: Secondary | ICD-10-CM | POA: Diagnosis not present

## 2017-10-20 DIAGNOSIS — E118 Type 2 diabetes mellitus with unspecified complications: Secondary | ICD-10-CM | POA: Diagnosis not present

## 2017-10-20 DIAGNOSIS — E0590 Thyrotoxicosis, unspecified without thyrotoxic crisis or storm: Secondary | ICD-10-CM | POA: Diagnosis not present

## 2017-10-20 DIAGNOSIS — R809 Proteinuria, unspecified: Secondary | ICD-10-CM | POA: Diagnosis not present

## 2017-10-27 DIAGNOSIS — M9901 Segmental and somatic dysfunction of cervical region: Secondary | ICD-10-CM | POA: Diagnosis not present

## 2017-10-27 DIAGNOSIS — M5033 Other cervical disc degeneration, cervicothoracic region: Secondary | ICD-10-CM | POA: Diagnosis not present

## 2017-10-27 DIAGNOSIS — M9902 Segmental and somatic dysfunction of thoracic region: Secondary | ICD-10-CM | POA: Diagnosis not present

## 2017-10-30 DIAGNOSIS — M5033 Other cervical disc degeneration, cervicothoracic region: Secondary | ICD-10-CM | POA: Diagnosis not present

## 2017-10-30 DIAGNOSIS — M9901 Segmental and somatic dysfunction of cervical region: Secondary | ICD-10-CM | POA: Diagnosis not present

## 2017-10-30 DIAGNOSIS — M9902 Segmental and somatic dysfunction of thoracic region: Secondary | ICD-10-CM | POA: Diagnosis not present

## 2017-11-24 DIAGNOSIS — I2510 Atherosclerotic heart disease of native coronary artery without angina pectoris: Secondary | ICD-10-CM | POA: Diagnosis not present

## 2017-11-24 DIAGNOSIS — I10 Essential (primary) hypertension: Secondary | ICD-10-CM | POA: Diagnosis not present

## 2017-11-24 DIAGNOSIS — E7800 Pure hypercholesterolemia, unspecified: Secondary | ICD-10-CM | POA: Diagnosis not present

## 2017-11-24 DIAGNOSIS — R809 Proteinuria, unspecified: Secondary | ICD-10-CM | POA: Diagnosis not present

## 2017-11-24 DIAGNOSIS — E0590 Thyrotoxicosis, unspecified without thyrotoxic crisis or storm: Secondary | ICD-10-CM | POA: Diagnosis not present

## 2017-11-24 DIAGNOSIS — E118 Type 2 diabetes mellitus with unspecified complications: Secondary | ICD-10-CM | POA: Diagnosis not present

## 2017-12-02 DIAGNOSIS — M9902 Segmental and somatic dysfunction of thoracic region: Secondary | ICD-10-CM | POA: Diagnosis not present

## 2017-12-02 DIAGNOSIS — M5134 Other intervertebral disc degeneration, thoracic region: Secondary | ICD-10-CM | POA: Diagnosis not present

## 2017-12-23 ENCOUNTER — Other Ambulatory Visit: Payer: Self-pay | Admitting: Cardiology

## 2017-12-23 DIAGNOSIS — E7800 Pure hypercholesterolemia, unspecified: Secondary | ICD-10-CM | POA: Diagnosis not present

## 2017-12-23 DIAGNOSIS — I2510 Atherosclerotic heart disease of native coronary artery without angina pectoris: Secondary | ICD-10-CM | POA: Diagnosis not present

## 2017-12-23 DIAGNOSIS — E0590 Thyrotoxicosis, unspecified without thyrotoxic crisis or storm: Secondary | ICD-10-CM | POA: Diagnosis not present

## 2017-12-23 DIAGNOSIS — Z6839 Body mass index (BMI) 39.0-39.9, adult: Secondary | ICD-10-CM | POA: Diagnosis not present

## 2017-12-23 DIAGNOSIS — I10 Essential (primary) hypertension: Secondary | ICD-10-CM | POA: Diagnosis not present

## 2017-12-23 DIAGNOSIS — E118 Type 2 diabetes mellitus with unspecified complications: Secondary | ICD-10-CM | POA: Diagnosis not present

## 2017-12-23 DIAGNOSIS — R809 Proteinuria, unspecified: Secondary | ICD-10-CM | POA: Diagnosis not present

## 2017-12-24 DIAGNOSIS — E0590 Thyrotoxicosis, unspecified without thyrotoxic crisis or storm: Secondary | ICD-10-CM | POA: Diagnosis not present

## 2017-12-24 DIAGNOSIS — E7800 Pure hypercholesterolemia, unspecified: Secondary | ICD-10-CM | POA: Diagnosis not present

## 2017-12-24 DIAGNOSIS — E118 Type 2 diabetes mellitus with unspecified complications: Secondary | ICD-10-CM | POA: Diagnosis not present

## 2017-12-24 DIAGNOSIS — E559 Vitamin D deficiency, unspecified: Secondary | ICD-10-CM | POA: Diagnosis not present

## 2017-12-24 DIAGNOSIS — I10 Essential (primary) hypertension: Secondary | ICD-10-CM | POA: Diagnosis not present

## 2018-01-21 DIAGNOSIS — E118 Type 2 diabetes mellitus with unspecified complications: Secondary | ICD-10-CM | POA: Diagnosis not present

## 2018-01-21 DIAGNOSIS — E559 Vitamin D deficiency, unspecified: Secondary | ICD-10-CM | POA: Diagnosis not present

## 2018-01-21 DIAGNOSIS — I10 Essential (primary) hypertension: Secondary | ICD-10-CM | POA: Diagnosis not present

## 2018-01-21 DIAGNOSIS — E7800 Pure hypercholesterolemia, unspecified: Secondary | ICD-10-CM | POA: Diagnosis not present

## 2018-01-21 DIAGNOSIS — Z6839 Body mass index (BMI) 39.0-39.9, adult: Secondary | ICD-10-CM | POA: Diagnosis not present

## 2018-01-21 DIAGNOSIS — E0590 Thyrotoxicosis, unspecified without thyrotoxic crisis or storm: Secondary | ICD-10-CM | POA: Diagnosis not present

## 2018-01-21 DIAGNOSIS — I2510 Atherosclerotic heart disease of native coronary artery without angina pectoris: Secondary | ICD-10-CM | POA: Diagnosis not present

## 2018-01-21 DIAGNOSIS — R809 Proteinuria, unspecified: Secondary | ICD-10-CM | POA: Diagnosis not present

## 2018-03-11 DIAGNOSIS — E7800 Pure hypercholesterolemia, unspecified: Secondary | ICD-10-CM | POA: Diagnosis not present

## 2018-03-11 DIAGNOSIS — M150 Primary generalized (osteo)arthritis: Secondary | ICD-10-CM | POA: Diagnosis not present

## 2018-03-11 DIAGNOSIS — E118 Type 2 diabetes mellitus with unspecified complications: Secondary | ICD-10-CM | POA: Diagnosis not present

## 2018-03-11 DIAGNOSIS — M0589 Other rheumatoid arthritis with rheumatoid factor of multiple sites: Secondary | ICD-10-CM | POA: Diagnosis not present

## 2018-03-11 DIAGNOSIS — M25539 Pain in unspecified wrist: Secondary | ICD-10-CM | POA: Diagnosis not present

## 2018-03-11 DIAGNOSIS — Z79899 Other long term (current) drug therapy: Secondary | ICD-10-CM | POA: Diagnosis not present

## 2018-03-11 DIAGNOSIS — M79643 Pain in unspecified hand: Secondary | ICD-10-CM | POA: Diagnosis not present

## 2018-03-22 ENCOUNTER — Other Ambulatory Visit: Payer: Self-pay | Admitting: Cardiology

## 2018-03-25 ENCOUNTER — Ambulatory Visit: Payer: Medicare HMO | Admitting: Cardiology

## 2018-03-25 ENCOUNTER — Encounter: Payer: Self-pay | Admitting: Cardiology

## 2018-03-25 VITALS — BP 142/80 | HR 90 | Ht 73.0 in | Wt 215.6 lb

## 2018-03-25 DIAGNOSIS — I10 Essential (primary) hypertension: Secondary | ICD-10-CM

## 2018-03-25 DIAGNOSIS — E785 Hyperlipidemia, unspecified: Secondary | ICD-10-CM | POA: Diagnosis not present

## 2018-03-25 DIAGNOSIS — I252 Old myocardial infarction: Secondary | ICD-10-CM

## 2018-03-25 DIAGNOSIS — I201 Angina pectoris with documented spasm: Secondary | ICD-10-CM | POA: Diagnosis not present

## 2018-03-25 DIAGNOSIS — E119 Type 2 diabetes mellitus without complications: Secondary | ICD-10-CM | POA: Diagnosis not present

## 2018-03-25 MED ORDER — DILTIAZEM HCL ER BEADS 300 MG PO CP24: 300.0000 mg | ORAL_CAPSULE | Freq: Every day | ORAL | 3 refills | Status: DC

## 2018-03-25 NOTE — Progress Notes (Signed)
Cardiology Office Note:    Date:  03/25/2018   ID:  ELIGH RYBACKI, DOB 1946-02-05, MRN [id number]  PCP:  Irena Reichmann, DO  Cardiologist:  Donato Schultz, MD  Electrophysiologist:  None   Referring MD: Darci Needle, MD     History of Present Illness:    William Hunter is a 72 y.o. male with myocardial infarction age 19 with heart catheterization 1981 Dr. Aleen Campi showing no coronary artery disease diagnosed with vasospasm with diabetes hypertension and hyperlipidemia here for follow-up of prior MI.  Previously has been diagnosed with rheumatoid arthritis.  Stated that he had a bout of coronary vasospasm when changing his blood pressure medications.  Doing well with atorvastatin.  Taking diabetic study drug.  Past Medical History:  Diagnosis Date  . Anginal pain ([name]   . Arthritis    "fingers" (03/09/2012)  . Cold feet    "from my diabetes; they stay cold" (03/09/2012)  . Coronary artery disease   . Coronary artery spasm [facility])    since age 75 Dr Aleen Campi  . Dyslipidemia   . Headache(784.0)   . Hx of gallstones    Dr Derrell Lolling lap cholecystectomy  . Hx of plastic surgery    plastic surgery following a fall off a loading dock ,lost  all his upper teeth   . Hypertension   . MI, old    at age 17  . Migraines   . Pneumonia ~ 1962; 1970's   "double"; "regular" (03/09/2012)  . Rheumatoid arthritis ([name]    Dr Dierdre Forth   . Slipped cervical disc 2008   Dr Patsi Sears in the past/ Dr Larita Fife , chiropractor in 2008  . Thyroid disease    Dr Lucianne Muss  . Type II diabetes mellitus ([name]     Past Surgical History:  Procedure Laterality Date  . CARDIAC CATHETERIZATION     x 3 with NO stents   . CHOLECYSTECTOMY  03/11/2012   Procedure: LAPAROSCOPIC CHOLECYSTECTOMY WITH INTRAOPERATIVE CHOLANGIOGRAM;  Surgeon: Ernestene Mention, MD;  Location: [facility] OR;  Service: General;  Laterality: N/A;  . PENILE PROSTHESIS IMPLANT  1983    Current Medications: Current Meds  Medication Sig  .  acetaminophen (TYLENOL) 500 MG tablet Take 1,500 mg by mouth 3 (three) times daily as needed for headache (pain).  . benazepril (LOTENSIN) 10 MG tablet Take 10 mg by mouth daily.   Marland Kitchen diltiazem (TIAZAC) 300 MG 24 hr capsule Take 1 capsule (300 mg total) by mouth daily.  . famotidine (PEPCID) 20 MG tablet Take 40 mg by mouth 2 (two) times daily.  Marland Kitchen glimepiride (AMARYL) 4 MG tablet Take 4 mg by mouth 2 (two) times daily.   Marland Kitchen JANUVIA 100 MG tablet Take 100 mg by mouth daily.  . metFORMIN (GLUCOPHAGE) 500 MG tablet Take 1,500 mg by mouth 2 (two) times daily with a meal. Pt reports taking 3 tablets in the morning and 3 tablets in the evening.  . pantoprazole (PROTONIX) 40 MG tablet Take 1 tablet (40 mg total) by mouth daily.  Marland Kitchen PRESCRIPTION MEDICATION Take 2 tablets by mouth daily with supper. Double blind diabetes study through [facility] (Dr. Juleen China).  . [DISCONTINUED] diltiazem (TIAZAC) 300 MG 24 hr capsule Take 1 capsule (300 mg total) by mouth daily. Please keep upcoming appt with Dr. Anne Fu for future refills.     Allergies:   Codeine; Penicillins; Prednisone; and Wool alcohol [lanolin]   Social History   Socioeconomic History  . Marital status: Married  Spouse name: Talbert Forest  . Number of children: 0  . Years of education: 67  . Highest education level: Not on file  Occupational History    Comment: [employer]  Social Needs  . Financial resource strain: Not on file  . Food insecurity:    Worry: Not on file    Inability: Not on file  . Transportation needs:    Medical: Not on file    Non-medical: Not on file  Tobacco Use  . Smoking status: Never Smoker  . Smokeless tobacco: Never Used  Substance and Sexual Activity  . Alcohol use: No  . Drug use: No  . Sexual activity: Yes  Lifestyle  . Physical activity:    Days per week: Not on file    Minutes per session: Not on file  . Stress: Not on file  Relationships  . Social connections:    Talks on phone: Not on  file    Gets together: Not on file    Attends religious service: Not on file    Active member of club or organization: Not on file    Attends meetings of clubs or organizations: Not on file    Relationship status: Not on file  Other Topics Concern  . Not on file  Social History Narrative   Lives with wife   Caffeine- Diet Coke 5 daily     Family History: The patient's family history includes Cancer - Other in his mother and sister; Heart attack in his sister; Heart disease in his father; Heart failure in his mother.  ROS:   Please see the history of present illness.    No fevers chills nausea vomiting syncope bleeding all other systems reviewed and are negative.  EKGs/Labs/Other Studies Reviewed:    The following studies were reviewed today: Lab work reviewed, prior EKG reviewed, office notes reviewed  EKG:  EKG is  ordered today.  The ekg ordered today demonstrates 03/25/2018-normal sinus rhythm possible old inferior infarct pattern personally reviewed and interpreted.  No significant change from prior.  Recent Labs: No results found for requested labs within last 8760 hours.  Recent Lipid Panel    Component Value Date/Time   CHOL 140 09/16/2013 0817   TRIG 117.0 09/16/2013 0817   HDL 36.50 (L) 09/16/2013 0817   CHOLHDL 4 09/16/2013 0817   VLDL 23.4 09/16/2013 0817   LDLCALC 80 09/16/2013 0817    Physical Exam:    VS:  BP (!) 142/80   Pulse 90   Ht 6\' 1"  (1.854 m)   Wt 215 lb 9.6 oz (97.8 kg)   BMI 28.44 kg/m     Wt Readings from Last 3 Encounters:  03/25/18 215 lb 9.6 oz (97.8 kg)  10/18/16 209 lb (94.8 kg)  09/16/16 206 lb 12.8 oz (93.8 kg)     GEN:  Well nourished, well developed in no acute distress HEENT: Normal NECK: No JVD; No carotid bruits LYMPHATICS: No lymphadenopathy CARDIAC: RRR, no murmurs, rubs, gallops RESPIRATORY:  Clear to auscultation without rales, wheezing or rhonchi  ABDOMEN: Soft, non-tender, non-distended,  protuberant MUSCULOSKELETAL:  No edema; No deformity  SKIN: Warm and dry NEUROLOGIC:  Alert and oriented x 3 PSYCHIATRIC:  Normal affect   ASSESSMENT:    1. Coronary vasospasm ([name]   2. Essential hypertension   3. MI, old   4. Dyslipidemia   5. Diabetes mellitus with coincident hypertension ([name]    PLAN:    In order of problems listed above:  Prior myocardial infarction  in his early 78s - Sounds like a type II myocardial infarction secondary to coronary vasospasm.  No CAD present on cardiac catheterization.  Nuclear stress test back in 2013 was low risk with no ischemia.  Normal ejection fraction.  Diabetes with hypertension and hyperlipidemia - Working with primary team on atorvastatin.  LDL in the 70 range.  Dr. Juleen China has retired. -Hemoglobin A1c 7.9 in May 2019. Trig 154 No changes. LDL 87 in July 2019.    Medication Adjustments/Labs and Tests Ordered: Current medicines are reviewed at length with the patient today.  Concerns regarding medicines are outlined above.  Orders Placed This Encounter  Procedures  . EKG 12-Lead   Meds ordered this encounter  Medications  . diltiazem (TIAZAC) 300 MG 24 hr capsule    Sig: Take 1 capsule (300 mg total) by mouth daily.    Dispense:  90 capsule    Refill:  3    Patient Instructions  Medication Instructions:  Your physician recommends that you continue on your current medications as directed. Please refer to the Current Medication list given to you today.  If you need a refill on your cardiac medications before your next appointment, please call your pharmacy.   Lab work: none If you have labs (blood work) drawn today and your tests are completely normal, you will receive your results only by: Marland Kitchen MyChart Message (if you have MyChart) OR . A paper copy in the mail If you have any lab test that is abnormal or we need to change your treatment, we will call you to review the  results.  Testing/Procedures: none  Follow-Up: At [facility], you and your health needs are our priority.  As part of our continuing mission to provide you with exceptional heart care, we have created designated Provider Care Teams.  These Care Teams include your primary Cardiologist (physician) and Advanced Practice Providers (APPs -  Physician Assistants and Nurse Practitioners) who all work together to provide you with the care you need, when you need it. You will need a follow up appointment in:  12 months.  Please call our office 2 months in advance to schedule this appointment.  You may see Donato Schultz, MD or one of the following Advanced Practice Providers on your designated Care Team: Tereso Newcomer, PA-C [place], [place] . Berton Bon, NP  Any Other Special Instructions Will Be Listed Below (If Applicable). none        Signed, Donato Schultz, MD  03/25/2018 5:20 PM    [facility]

## 2018-03-25 NOTE — Patient Instructions (Signed)
Medication Instructions:  Your physician recommends that you continue on your current medications as directed. Please refer to the Current Medication list given to you today.  If you need a refill on your cardiac medications before your next appointment, please call your pharmacy.   Lab work: none If you have labs (blood work) drawn today and your tests are completely normal, you will receive your results only by: Marland Kitchen MyChart Message (if you have MyChart) OR . A paper copy in the mail If you have any lab test that is abnormal or we need to change your treatment, we will call you to review the results.  Testing/Procedures: none  Follow-Up: At [facility], you and your health needs are our priority.  As part of our continuing mission to provide you with exceptional heart care, we have created designated Provider Care Teams.  These Care Teams include your primary Cardiologist (physician) and Advanced Practice Providers (APPs -  Physician Assistants and Nurse Practitioners) who all work together to provide you with the care you need, when you need it. You will need a follow up appointment in:  12 months.  Please call our office 2 months in advance to schedule this appointment.  You may see Donato Schultz, MD or one of the following Advanced Practice Providers on your designated Care Team: Tereso Newcomer, PA-C [place], [place] . Berton Bon, NP  Any Other Special Instructions Will Be Listed Below (If Applicable). none

## 2018-04-15 DIAGNOSIS — E7800 Pure hypercholesterolemia, unspecified: Secondary | ICD-10-CM | POA: Diagnosis not present

## 2018-04-15 DIAGNOSIS — I10 Essential (primary) hypertension: Secondary | ICD-10-CM | POA: Diagnosis not present

## 2018-04-15 DIAGNOSIS — E559 Vitamin D deficiency, unspecified: Secondary | ICD-10-CM | POA: Diagnosis not present

## 2018-04-15 DIAGNOSIS — E0590 Thyrotoxicosis, unspecified without thyrotoxic crisis or storm: Secondary | ICD-10-CM | POA: Diagnosis not present

## 2018-04-15 DIAGNOSIS — E118 Type 2 diabetes mellitus with unspecified complications: Secondary | ICD-10-CM | POA: Diagnosis not present

## 2018-04-22 DIAGNOSIS — E118 Type 2 diabetes mellitus with unspecified complications: Secondary | ICD-10-CM | POA: Diagnosis not present

## 2018-04-22 DIAGNOSIS — R809 Proteinuria, unspecified: Secondary | ICD-10-CM | POA: Diagnosis not present

## 2018-04-22 DIAGNOSIS — E0590 Thyrotoxicosis, unspecified without thyrotoxic crisis or storm: Secondary | ICD-10-CM | POA: Diagnosis not present

## 2018-04-22 DIAGNOSIS — Z6839 Body mass index (BMI) 39.0-39.9, adult: Secondary | ICD-10-CM | POA: Diagnosis not present

## 2018-04-22 DIAGNOSIS — E7800 Pure hypercholesterolemia, unspecified: Secondary | ICD-10-CM | POA: Diagnosis not present

## 2018-04-22 DIAGNOSIS — E559 Vitamin D deficiency, unspecified: Secondary | ICD-10-CM | POA: Diagnosis not present

## 2018-04-22 DIAGNOSIS — I10 Essential (primary) hypertension: Secondary | ICD-10-CM | POA: Diagnosis not present

## 2018-04-22 DIAGNOSIS — I2510 Atherosclerotic heart disease of native coronary artery without angina pectoris: Secondary | ICD-10-CM | POA: Diagnosis not present

## 2018-05-11 DIAGNOSIS — M150 Primary generalized (osteo)arthritis: Secondary | ICD-10-CM | POA: Diagnosis not present

## 2018-05-11 DIAGNOSIS — M79643 Pain in unspecified hand: Secondary | ICD-10-CM | POA: Diagnosis not present

## 2018-05-11 DIAGNOSIS — Z79899 Other long term (current) drug therapy: Secondary | ICD-10-CM | POA: Diagnosis not present

## 2018-05-11 DIAGNOSIS — E1169 Type 2 diabetes mellitus with other specified complication: Secondary | ICD-10-CM | POA: Diagnosis not present

## 2018-05-11 DIAGNOSIS — E7800 Pure hypercholesterolemia, unspecified: Secondary | ICD-10-CM | POA: Diagnosis not present

## 2018-05-11 DIAGNOSIS — M0589 Other rheumatoid arthritis with rheumatoid factor of multiple sites: Secondary | ICD-10-CM | POA: Diagnosis not present

## 2018-05-13 ENCOUNTER — Other Ambulatory Visit: Payer: Self-pay | Admitting: Cardiology

## 2018-07-15 DIAGNOSIS — E118 Type 2 diabetes mellitus with unspecified complications: Secondary | ICD-10-CM | POA: Diagnosis not present

## 2018-07-15 DIAGNOSIS — E0590 Thyrotoxicosis, unspecified without thyrotoxic crisis or storm: Secondary | ICD-10-CM | POA: Diagnosis not present

## 2018-07-15 DIAGNOSIS — Z0000 Encounter for general adult medical examination without abnormal findings: Secondary | ICD-10-CM | POA: Diagnosis not present

## 2018-07-15 DIAGNOSIS — I10 Essential (primary) hypertension: Secondary | ICD-10-CM | POA: Diagnosis not present

## 2018-07-15 DIAGNOSIS — E559 Vitamin D deficiency, unspecified: Secondary | ICD-10-CM | POA: Diagnosis not present

## 2018-07-22 DIAGNOSIS — R809 Proteinuria, unspecified: Secondary | ICD-10-CM | POA: Diagnosis not present

## 2018-07-22 DIAGNOSIS — E118 Type 2 diabetes mellitus with unspecified complications: Secondary | ICD-10-CM | POA: Diagnosis not present

## 2018-07-22 DIAGNOSIS — I10 Essential (primary) hypertension: Secondary | ICD-10-CM | POA: Diagnosis not present

## 2018-07-22 DIAGNOSIS — I2510 Atherosclerotic heart disease of native coronary artery without angina pectoris: Secondary | ICD-10-CM | POA: Diagnosis not present

## 2018-07-22 DIAGNOSIS — E7800 Pure hypercholesterolemia, unspecified: Secondary | ICD-10-CM | POA: Diagnosis not present

## 2018-07-22 DIAGNOSIS — E0590 Thyrotoxicosis, unspecified without thyrotoxic crisis or storm: Secondary | ICD-10-CM | POA: Diagnosis not present

## 2018-07-22 DIAGNOSIS — Z125 Encounter for screening for malignant neoplasm of prostate: Secondary | ICD-10-CM | POA: Diagnosis not present

## 2018-07-22 DIAGNOSIS — Z6827 Body mass index (BMI) 27.0-27.9, adult: Secondary | ICD-10-CM | POA: Diagnosis not present

## 2018-07-22 DIAGNOSIS — E559 Vitamin D deficiency, unspecified: Secondary | ICD-10-CM | POA: Diagnosis not present

## 2018-07-23 DIAGNOSIS — Z0000 Encounter for general adult medical examination without abnormal findings: Secondary | ICD-10-CM | POA: Diagnosis not present

## 2018-07-23 DIAGNOSIS — Z1211 Encounter for screening for malignant neoplasm of colon: Secondary | ICD-10-CM | POA: Diagnosis not present

## 2018-08-04 DIAGNOSIS — E7800 Pure hypercholesterolemia, unspecified: Secondary | ICD-10-CM | POA: Diagnosis not present

## 2018-08-04 DIAGNOSIS — I10 Essential (primary) hypertension: Secondary | ICD-10-CM | POA: Diagnosis not present

## 2018-08-04 DIAGNOSIS — E118 Type 2 diabetes mellitus with unspecified complications: Secondary | ICD-10-CM | POA: Diagnosis not present

## 2018-08-10 DIAGNOSIS — M150 Primary generalized (osteo)arthritis: Secondary | ICD-10-CM | POA: Diagnosis not present

## 2018-08-10 DIAGNOSIS — E1169 Type 2 diabetes mellitus with other specified complication: Secondary | ICD-10-CM | POA: Diagnosis not present

## 2018-08-10 DIAGNOSIS — Z79899 Other long term (current) drug therapy: Secondary | ICD-10-CM | POA: Diagnosis not present

## 2018-08-10 DIAGNOSIS — M0589 Other rheumatoid arthritis with rheumatoid factor of multiple sites: Secondary | ICD-10-CM | POA: Diagnosis not present

## 2018-08-10 DIAGNOSIS — M79643 Pain in unspecified hand: Secondary | ICD-10-CM | POA: Diagnosis not present

## 2018-09-23 NOTE — Telephone Encounter (Signed)
error 

## 2018-10-14 DIAGNOSIS — E119 Type 2 diabetes mellitus without complications: Secondary | ICD-10-CM | POA: Diagnosis not present

## 2018-11-12 DIAGNOSIS — M25562 Pain in left knee: Secondary | ICD-10-CM | POA: Diagnosis not present

## 2018-11-12 DIAGNOSIS — M1711 Unilateral primary osteoarthritis, right knee: Secondary | ICD-10-CM | POA: Diagnosis not present

## 2018-11-12 DIAGNOSIS — M25569 Pain in unspecified knee: Secondary | ICD-10-CM | POA: Diagnosis not present

## 2018-11-12 DIAGNOSIS — Z79899 Other long term (current) drug therapy: Secondary | ICD-10-CM | POA: Diagnosis not present

## 2018-11-12 DIAGNOSIS — M1712 Unilateral primary osteoarthritis, left knee: Secondary | ICD-10-CM | POA: Diagnosis not present

## 2018-11-12 DIAGNOSIS — M25561 Pain in right knee: Secondary | ICD-10-CM | POA: Diagnosis not present

## 2018-11-12 DIAGNOSIS — M150 Primary generalized (osteo)arthritis: Secondary | ICD-10-CM | POA: Diagnosis not present

## 2018-11-12 DIAGNOSIS — M0589 Other rheumatoid arthritis with rheumatoid factor of multiple sites: Secondary | ICD-10-CM | POA: Diagnosis not present

## 2018-11-12 DIAGNOSIS — M79643 Pain in unspecified hand: Secondary | ICD-10-CM | POA: Diagnosis not present

## 2018-11-16 DIAGNOSIS — I2510 Atherosclerotic heart disease of native coronary artery without angina pectoris: Secondary | ICD-10-CM | POA: Diagnosis not present

## 2018-11-16 DIAGNOSIS — E559 Vitamin D deficiency, unspecified: Secondary | ICD-10-CM | POA: Diagnosis not present

## 2018-11-16 DIAGNOSIS — E0590 Thyrotoxicosis, unspecified without thyrotoxic crisis or storm: Secondary | ICD-10-CM | POA: Diagnosis not present

## 2018-11-16 DIAGNOSIS — E118 Type 2 diabetes mellitus with unspecified complications: Secondary | ICD-10-CM | POA: Diagnosis not present

## 2018-11-16 DIAGNOSIS — R809 Proteinuria, unspecified: Secondary | ICD-10-CM | POA: Diagnosis not present

## 2018-11-16 DIAGNOSIS — E119 Type 2 diabetes mellitus without complications: Secondary | ICD-10-CM | POA: Diagnosis not present

## 2018-11-30 DIAGNOSIS — E0590 Thyrotoxicosis, unspecified without thyrotoxic crisis or storm: Secondary | ICD-10-CM | POA: Diagnosis not present

## 2018-11-30 DIAGNOSIS — R809 Proteinuria, unspecified: Secondary | ICD-10-CM | POA: Diagnosis not present

## 2018-11-30 DIAGNOSIS — E118 Type 2 diabetes mellitus with unspecified complications: Secondary | ICD-10-CM | POA: Diagnosis not present

## 2018-11-30 DIAGNOSIS — I2510 Atherosclerotic heart disease of native coronary artery without angina pectoris: Secondary | ICD-10-CM | POA: Diagnosis not present

## 2018-11-30 DIAGNOSIS — Z23 Encounter for immunization: Secondary | ICD-10-CM | POA: Diagnosis not present

## 2018-12-28 DIAGNOSIS — E0590 Thyrotoxicosis, unspecified without thyrotoxic crisis or storm: Secondary | ICD-10-CM | POA: Diagnosis not present

## 2018-12-28 DIAGNOSIS — I2510 Atherosclerotic heart disease of native coronary artery without angina pectoris: Secondary | ICD-10-CM | POA: Diagnosis not present

## 2018-12-28 DIAGNOSIS — E118 Type 2 diabetes mellitus with unspecified complications: Secondary | ICD-10-CM | POA: Diagnosis not present

## 2018-12-28 DIAGNOSIS — R809 Proteinuria, unspecified: Secondary | ICD-10-CM | POA: Diagnosis not present

## 2019-01-25 DIAGNOSIS — E118 Type 2 diabetes mellitus with unspecified complications: Secondary | ICD-10-CM | POA: Diagnosis not present

## 2019-01-25 DIAGNOSIS — R809 Proteinuria, unspecified: Secondary | ICD-10-CM | POA: Diagnosis not present

## 2019-01-25 DIAGNOSIS — I2510 Atherosclerotic heart disease of native coronary artery without angina pectoris: Secondary | ICD-10-CM | POA: Diagnosis not present

## 2019-01-25 DIAGNOSIS — E559 Vitamin D deficiency, unspecified: Secondary | ICD-10-CM | POA: Diagnosis not present

## 2019-01-25 DIAGNOSIS — E0590 Thyrotoxicosis, unspecified without thyrotoxic crisis or storm: Secondary | ICD-10-CM | POA: Diagnosis not present

## 2019-02-12 DIAGNOSIS — Z79899 Other long term (current) drug therapy: Secondary | ICD-10-CM | POA: Diagnosis not present

## 2019-02-12 DIAGNOSIS — M0589 Other rheumatoid arthritis with rheumatoid factor of multiple sites: Secondary | ICD-10-CM | POA: Diagnosis not present

## 2019-02-12 DIAGNOSIS — M150 Primary generalized (osteo)arthritis: Secondary | ICD-10-CM | POA: Diagnosis not present

## 2019-02-12 DIAGNOSIS — M79643 Pain in unspecified hand: Secondary | ICD-10-CM | POA: Diagnosis not present

## 2019-03-17 DIAGNOSIS — M170 Bilateral primary osteoarthritis of knee: Secondary | ICD-10-CM | POA: Diagnosis not present

## 2019-03-17 DIAGNOSIS — M25569 Pain in unspecified knee: Secondary | ICD-10-CM | POA: Diagnosis not present

## 2019-03-17 DIAGNOSIS — M150 Primary generalized (osteo)arthritis: Secondary | ICD-10-CM | POA: Diagnosis not present

## 2019-03-17 DIAGNOSIS — M0589 Other rheumatoid arthritis with rheumatoid factor of multiple sites: Secondary | ICD-10-CM | POA: Diagnosis not present

## 2019-03-26 ENCOUNTER — Ambulatory Visit: Payer: Medicare HMO | Admitting: Cardiology

## 2019-03-29 DIAGNOSIS — M150 Primary generalized (osteo)arthritis: Secondary | ICD-10-CM | POA: Diagnosis not present

## 2019-03-29 DIAGNOSIS — M25569 Pain in unspecified knee: Secondary | ICD-10-CM | POA: Diagnosis not present

## 2019-04-01 ENCOUNTER — Ambulatory Visit: Payer: Medicare HMO | Admitting: Cardiology

## 2019-04-23 DIAGNOSIS — E118 Type 2 diabetes mellitus with unspecified complications: Secondary | ICD-10-CM | POA: Diagnosis not present

## 2019-04-23 DIAGNOSIS — M0589 Other rheumatoid arthritis with rheumatoid factor of multiple sites: Secondary | ICD-10-CM | POA: Diagnosis not present

## 2019-04-23 DIAGNOSIS — E0590 Thyrotoxicosis, unspecified without thyrotoxic crisis or storm: Secondary | ICD-10-CM | POA: Diagnosis not present

## 2019-05-24 ENCOUNTER — Other Ambulatory Visit: Payer: Self-pay | Admitting: Cardiology

## 2019-06-14 DIAGNOSIS — M9901 Segmental and somatic dysfunction of cervical region: Secondary | ICD-10-CM | POA: Diagnosis not present

## 2019-06-14 DIAGNOSIS — M5033 Other cervical disc degeneration, cervicothoracic region: Secondary | ICD-10-CM | POA: Diagnosis not present

## 2019-06-15 DIAGNOSIS — M9901 Segmental and somatic dysfunction of cervical region: Secondary | ICD-10-CM | POA: Diagnosis not present

## 2019-06-15 DIAGNOSIS — M5033 Other cervical disc degeneration, cervicothoracic region: Secondary | ICD-10-CM | POA: Diagnosis not present

## 2019-06-17 DIAGNOSIS — M9901 Segmental and somatic dysfunction of cervical region: Secondary | ICD-10-CM | POA: Diagnosis not present

## 2019-06-17 DIAGNOSIS — M5033 Other cervical disc degeneration, cervicothoracic region: Secondary | ICD-10-CM | POA: Diagnosis not present

## 2019-06-25 ENCOUNTER — Other Ambulatory Visit: Payer: Self-pay | Admitting: Cardiology

## 2019-07-02 ENCOUNTER — Inpatient Hospital Stay (HOSPITAL_COMMUNITY)
Admission: EM | Admit: 2019-07-02 | Discharge: 2019-07-06 | DRG: 177 | Disposition: A | Discharge status: Skilled Nursing Facility | Payer: Medicare HMO | Attending: Family Medicine | Admitting: Family Medicine

## 2019-07-02 ENCOUNTER — Emergency Department (HOSPITAL_COMMUNITY): Payer: Medicare HMO

## 2019-07-02 ENCOUNTER — Encounter (HOSPITAL_COMMUNITY): Payer: Self-pay | Admitting: Emergency Medicine

## 2019-07-02 DIAGNOSIS — I252 Old myocardial infarction: Secondary | ICD-10-CM | POA: Diagnosis not present

## 2019-07-02 DIAGNOSIS — J9601 Acute respiratory failure with hypoxia: Secondary | ICD-10-CM

## 2019-07-02 DIAGNOSIS — M549 Dorsalgia, unspecified: Secondary | ICD-10-CM | POA: Diagnosis present

## 2019-07-02 DIAGNOSIS — I10 Essential (primary) hypertension: Secondary | ICD-10-CM | POA: Diagnosis present

## 2019-07-02 DIAGNOSIS — E876 Hypokalemia: Secondary | ICD-10-CM | POA: Diagnosis not present

## 2019-07-02 DIAGNOSIS — M069 Rheumatoid arthritis, unspecified: Secondary | ICD-10-CM | POA: Diagnosis present

## 2019-07-02 DIAGNOSIS — S299XXA Unspecified injury of thorax, initial encounter: Secondary | ICD-10-CM | POA: Diagnosis not present

## 2019-07-02 DIAGNOSIS — I2510 Atherosclerotic heart disease of native coronary artery without angina pectoris: Secondary | ICD-10-CM | POA: Diagnosis present

## 2019-07-02 DIAGNOSIS — W19XXXA Unspecified fall, initial encounter: Secondary | ICD-10-CM

## 2019-07-02 DIAGNOSIS — J1282 Pneumonia due to coronavirus disease 2019: Secondary | ICD-10-CM | POA: Diagnosis present

## 2019-07-02 DIAGNOSIS — U071 COVID-19: Principal | ICD-10-CM | POA: Diagnosis present

## 2019-07-02 DIAGNOSIS — Z794 Long term (current) use of insulin: Secondary | ICD-10-CM | POA: Diagnosis not present

## 2019-07-02 DIAGNOSIS — S3992XA Unspecified injury of lower back, initial encounter: Secondary | ICD-10-CM | POA: Diagnosis not present

## 2019-07-02 DIAGNOSIS — S3991XA Unspecified injury of abdomen, initial encounter: Secondary | ICD-10-CM | POA: Diagnosis not present

## 2019-07-02 DIAGNOSIS — E11649 Type 2 diabetes mellitus with hypoglycemia without coma: Secondary | ICD-10-CM | POA: Diagnosis not present

## 2019-07-02 DIAGNOSIS — R05 Cough: Secondary | ICD-10-CM | POA: Diagnosis not present

## 2019-07-02 DIAGNOSIS — G4700 Insomnia, unspecified: Secondary | ICD-10-CM | POA: Diagnosis not present

## 2019-07-02 DIAGNOSIS — Z880 Allergy status to penicillin: Secondary | ICD-10-CM

## 2019-07-02 DIAGNOSIS — Z885 Allergy status to narcotic agent status: Secondary | ICD-10-CM

## 2019-07-02 DIAGNOSIS — Z888 Allergy status to other drugs, medicaments and biological substances status: Secondary | ICD-10-CM

## 2019-07-02 DIAGNOSIS — Z808 Family history of malignant neoplasm of other organs or systems: Secondary | ICD-10-CM

## 2019-07-02 DIAGNOSIS — M545 Low back pain: Secondary | ICD-10-CM | POA: Diagnosis not present

## 2019-07-02 DIAGNOSIS — E118 Type 2 diabetes mellitus with unspecified complications: Secondary | ICD-10-CM | POA: Diagnosis not present

## 2019-07-02 DIAGNOSIS — J9600 Acute respiratory failure, unspecified whether with hypoxia or hypercapnia: Secondary | ICD-10-CM | POA: Diagnosis present

## 2019-07-02 DIAGNOSIS — Z9181 History of falling: Secondary | ICD-10-CM

## 2019-07-02 DIAGNOSIS — M6281 Muscle weakness (generalized): Secondary | ICD-10-CM | POA: Diagnosis not present

## 2019-07-02 DIAGNOSIS — R0902 Hypoxemia: Secondary | ICD-10-CM | POA: Diagnosis not present

## 2019-07-02 DIAGNOSIS — Z8701 Personal history of pneumonia (recurrent): Secondary | ICD-10-CM | POA: Diagnosis not present

## 2019-07-02 DIAGNOSIS — R262 Difficulty in walking, not elsewhere classified: Secondary | ICD-10-CM | POA: Diagnosis not present

## 2019-07-02 DIAGNOSIS — I824Z2 Acute embolism and thrombosis of unspecified deep veins of left distal lower extremity: Secondary | ICD-10-CM | POA: Diagnosis present

## 2019-07-02 DIAGNOSIS — R109 Unspecified abdominal pain: Secondary | ICD-10-CM | POA: Diagnosis not present

## 2019-07-02 DIAGNOSIS — Z79899 Other long term (current) drug therapy: Secondary | ICD-10-CM | POA: Diagnosis not present

## 2019-07-02 DIAGNOSIS — Z8249 Family history of ischemic heart disease and other diseases of the circulatory system: Secondary | ICD-10-CM

## 2019-07-02 DIAGNOSIS — Z743 Need for continuous supervision: Secondary | ICD-10-CM | POA: Diagnosis not present

## 2019-07-02 DIAGNOSIS — I2583 Coronary atherosclerosis due to lipid rich plaque: Secondary | ICD-10-CM | POA: Diagnosis not present

## 2019-07-02 DIAGNOSIS — I82409 Acute embolism and thrombosis of unspecified deep veins of unspecified lower extremity: Secondary | ICD-10-CM | POA: Diagnosis not present

## 2019-07-02 DIAGNOSIS — S0990XA Unspecified injury of head, initial encounter: Secondary | ICD-10-CM | POA: Diagnosis not present

## 2019-07-02 DIAGNOSIS — S199XXA Unspecified injury of neck, initial encounter: Secondary | ICD-10-CM | POA: Diagnosis not present

## 2019-07-02 DIAGNOSIS — R531 Weakness: Secondary | ICD-10-CM | POA: Diagnosis not present

## 2019-07-02 DIAGNOSIS — R52 Pain, unspecified: Secondary | ICD-10-CM | POA: Diagnosis not present

## 2019-07-02 DIAGNOSIS — R279 Unspecified lack of coordination: Secondary | ICD-10-CM | POA: Diagnosis not present

## 2019-07-02 DIAGNOSIS — R0602 Shortness of breath: Secondary | ICD-10-CM | POA: Diagnosis not present

## 2019-07-02 LAB — HEPATIC FUNCTION PANEL
ALT: 22 U/L (ref 0–44)
AST: 21 U/L (ref 15–41)
Albumin: 3.3 g/dL — ABNORMAL LOW (ref 3.5–5.0)
Alkaline Phosphatase: 76 U/L (ref 38–126)
Bilirubin, Direct: 0.2 mg/dL (ref 0.0–0.2)
Indirect Bilirubin: 0.9 mg/dL (ref 0.3–0.9)
Total Bilirubin: 1.1 mg/dL (ref 0.3–1.2)
Total Protein: 8.1 g/dL (ref 6.5–8.1)

## 2019-07-02 LAB — CBC
HCT: 46.1 % (ref 39.0–52.0)
Hemoglobin: 15.7 g/dL (ref 13.0–17.0)
MCH: 30.5 pg (ref 26.0–34.0)
MCHC: 34.1 g/dL (ref 30.0–36.0)
MCV: 89.5 fL (ref 80.0–100.0)
Platelets: 244 10*3/uL (ref 150–400)
RBC: 5.15 MIL/uL (ref 4.22–5.81)
RDW: 12.9 % (ref 11.5–15.5)
WBC: 7 10*3/uL (ref 4.0–10.5)
nRBC: 0 % (ref 0.0–0.2)

## 2019-07-02 LAB — BASIC METABOLIC PANEL
Anion gap: 15 (ref 5–15)
BUN: 20 mg/dL (ref 8–23)
CO2: 21 mmol/L — ABNORMAL LOW (ref 22–32)
Calcium: 9.2 mg/dL (ref 8.9–10.3)
Chloride: 99 mmol/L (ref 98–111)
Creatinine, Ser: 0.94 mg/dL (ref 0.61–1.24)
GFR calc Af Amer: >60 mL/min (ref >60)
GFR calc non Af Amer: >60 mL/min (ref >60)
Glucose, Bld: 207 mg/dL — ABNORMAL HIGH (ref 70–99)
Potassium: 4.1 mmol/L (ref 3.5–5.1)
Sodium: 135 mmol/L (ref 135–145)

## 2019-07-02 LAB — TROPONIN I (HIGH SENSITIVITY)
Troponin I (High Sensitivity): <2 ng/L (ref <18)
Troponin I (High Sensitivity): 3 ng/L (ref <18)

## 2019-07-02 LAB — COMPREHENSIVE METABOLIC PANEL
ALT: 23 U/L (ref 0–44)
AST: 20 U/L (ref 15–41)
Albumin: 3.1 g/dL — ABNORMAL LOW (ref 3.5–5.0)
Alkaline Phosphatase: 71 U/L (ref 38–126)
Anion gap: 16 — ABNORMAL HIGH (ref 5–15)
BUN: 19 mg/dL (ref 8–23)
CO2: 18 mmol/L — ABNORMAL LOW (ref 22–32)
Calcium: 8.4 mg/dL — ABNORMAL LOW (ref 8.9–10.3)
Chloride: 99 mmol/L (ref 98–111)
Creatinine, Ser: 0.97 mg/dL (ref 0.61–1.24)
GFR calc Af Amer: >60 mL/min (ref >60)
GFR calc non Af Amer: >60 mL/min (ref >60)
Glucose, Bld: 177 mg/dL — ABNORMAL HIGH (ref 70–99)
Potassium: 3.8 mmol/L (ref 3.5–5.1)
Sodium: 133 mmol/L — ABNORMAL LOW (ref 135–145)
Total Bilirubin: 1.2 mg/dL (ref 0.3–1.2)
Total Protein: 7.5 g/dL (ref 6.5–8.1)

## 2019-07-02 LAB — RESPIRATORY PANEL BY RT PCR (FLU A&B, COVID)
Influenza A by PCR: NEGATIVE
Influenza B by PCR: NEGATIVE
SARS Coronavirus 2 by RT PCR: POSITIVE — AB

## 2019-07-02 LAB — FERRITIN: Ferritin: 219 ng/mL (ref 24–336)

## 2019-07-02 LAB — TRIGLYCERIDES: Triglycerides: 106 mg/dL (ref <150)

## 2019-07-02 LAB — LACTATE DEHYDROGENASE: LDH: 201 U/L — ABNORMAL HIGH (ref 98–192)

## 2019-07-02 LAB — PROCALCITONIN: Procalcitonin: <0.1 ng/mL

## 2019-07-02 LAB — LIPASE, BLOOD: Lipase: 31 U/L (ref 11–51)

## 2019-07-02 LAB — D-DIMER, QUANTITATIVE: D-Dimer, Quant: 0.63 ug/mL-FEU — ABNORMAL HIGH (ref 0.00–0.50)

## 2019-07-02 LAB — POC SARS CORONAVIRUS 2 AG -  ED: SARS Coronavirus 2 Ag: NEGATIVE

## 2019-07-02 LAB — FIBRINOGEN: Fibrinogen: 706 mg/dL — ABNORMAL HIGH (ref 210–475)

## 2019-07-02 LAB — C-REACTIVE PROTEIN: CRP: 8.5 mg/dL — ABNORMAL HIGH (ref <1.0)

## 2019-07-02 LAB — LACTIC ACID, PLASMA: Lactic Acid, Venous: 1.3 mmol/L (ref 0.5–1.9)

## 2019-07-02 IMAGING — CT CT ABD-PELV W/ CM
2 of 6 series · 15 of 46 positions shown, 17 images · IV contrast (omnipaque)
Comparison: CT of the pelvis dated [DATE].

CLINICAL DATA: Abdominal trauma. Weakness and cough. Pain status
post fall.

EXAM:
CT ABDOMEN AND PELVIS WITH CONTRAST
CT LUMBAR SPINE WITHOUT CONTRAST
TECHNIQUE: Multidetector CT imaging of the abdomen and pelvis was performed
using the standard protocol following bolus administration of
intravenous contrast.
Multiplanar CT images of the lumbar spine was reconstructed from
contemporary CT of the Abdomen, and Pelvis
CONTRAST:  100mL OMNIPAQUE IOHEXOL 300 MG/ML  SOLN

[Series 8: thins · axial · 0.83mm/px · z∈[-900,-414]mm · 12 of 534 slices shown, 14 images]
[im 24/534  soft-tissue]
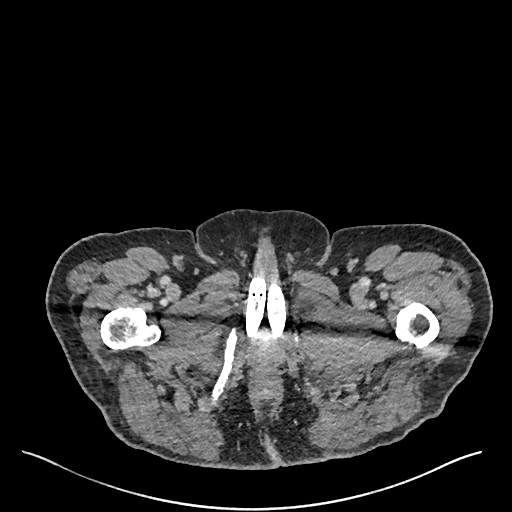
[im 24/534  bone]
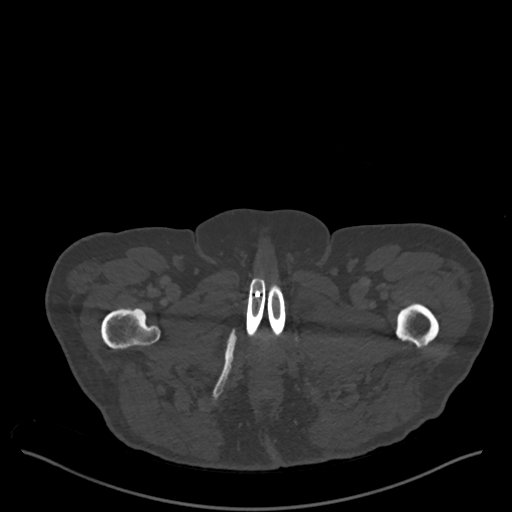
[im 70/534  soft-tissue]
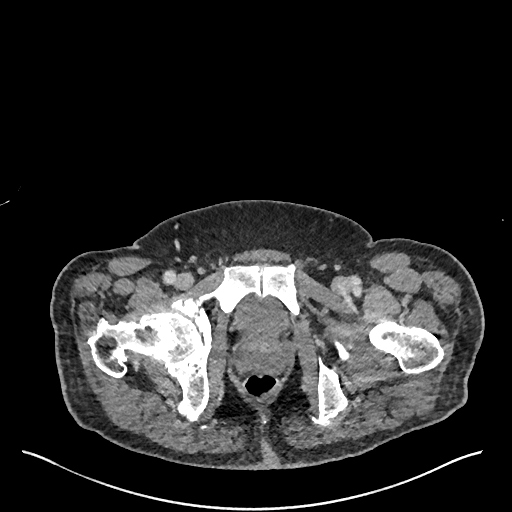
[im 116/534  soft-tissue]
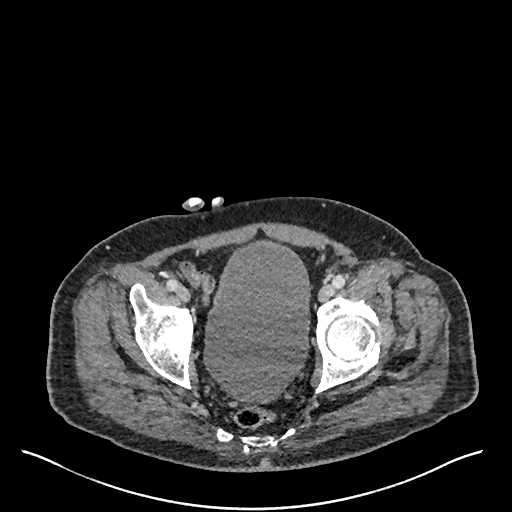
[im 163/534  soft-tissue]
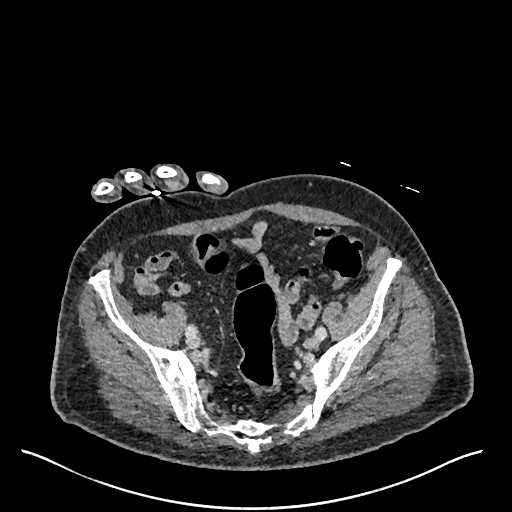
[im 209/534  soft-tissue]
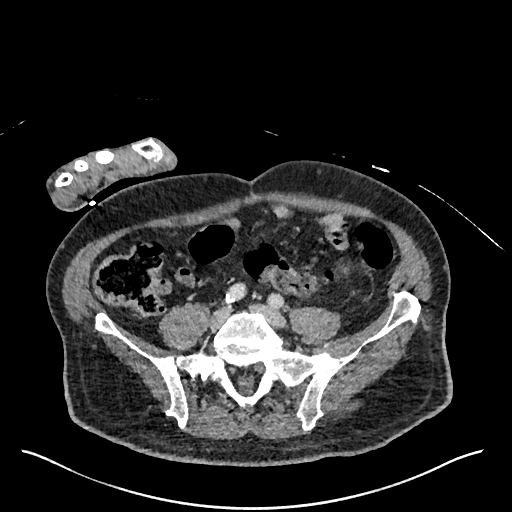
[im 255/534  soft-tissue]
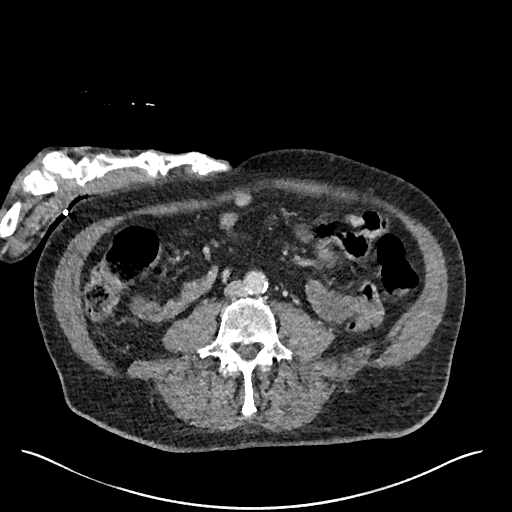
[im 279/534  soft-tissue]
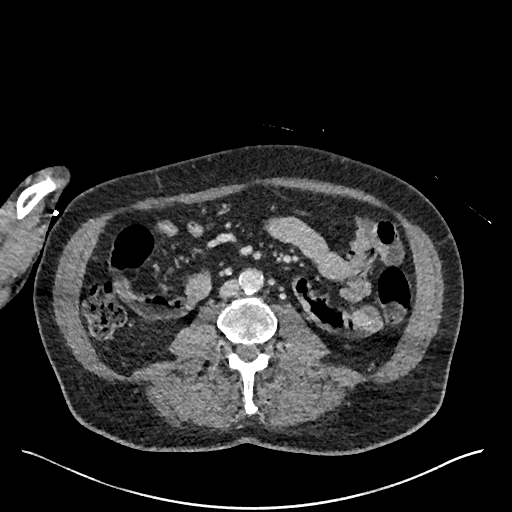
[im 325/534  soft-tissue]
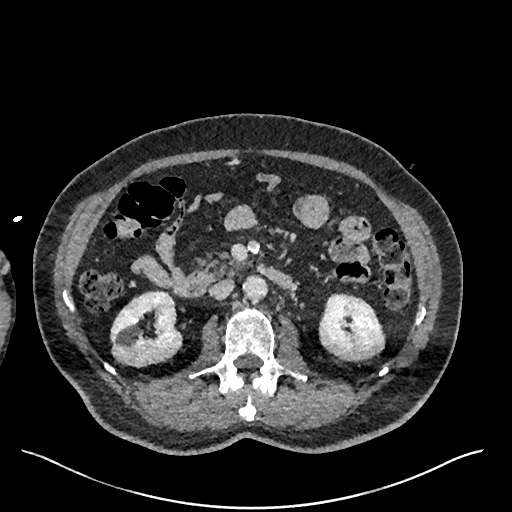
[im 371/534  soft-tissue]
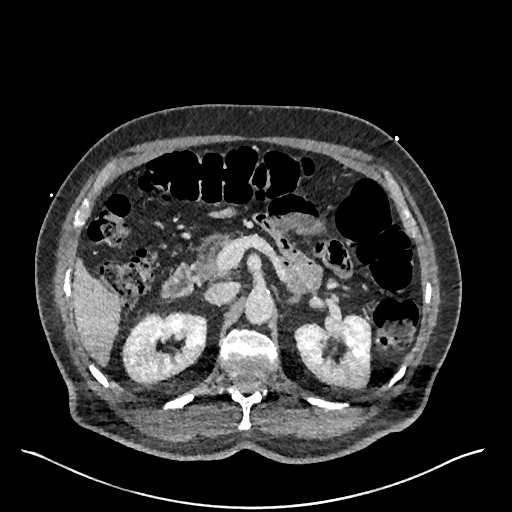
[im 371/534  bone]
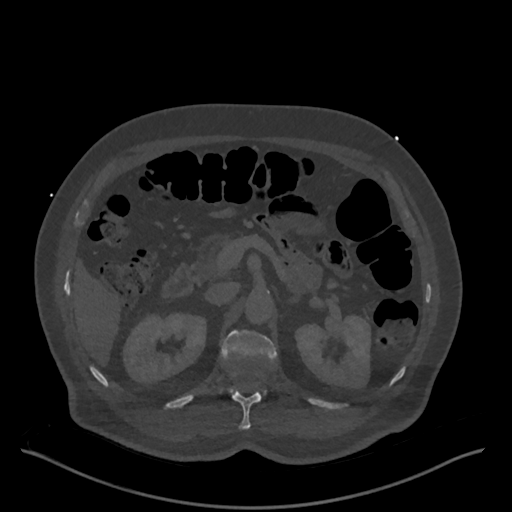
[im 418/534  soft-tissue]
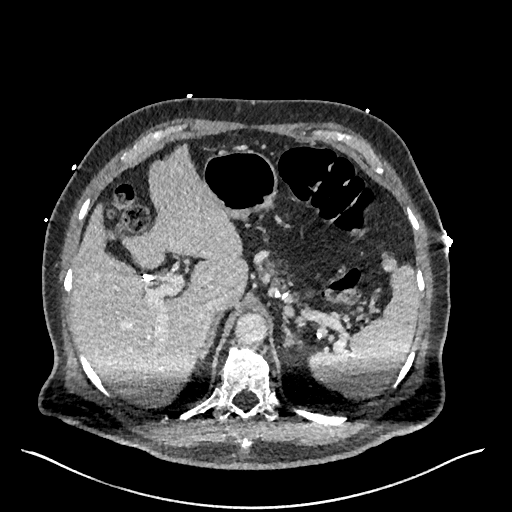
[im 464/534  soft-tissue]
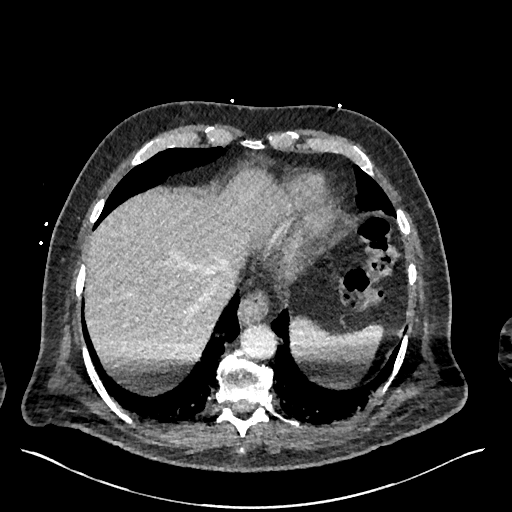
[im 510/534  soft-tissue]
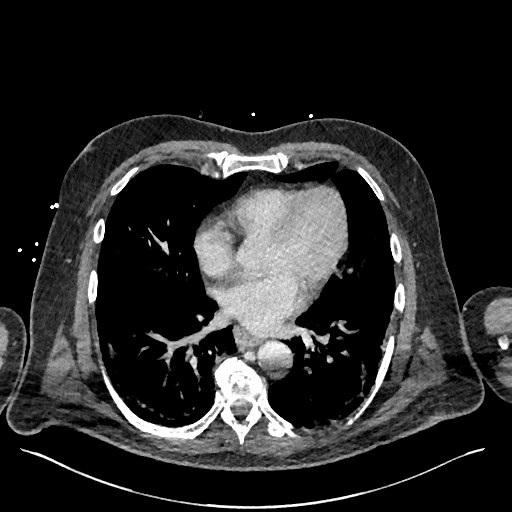

[Series 10: coronals · coronal · 0.77mm/px · 3 of 101 slices shown]
[im 34/101  soft-tissue]
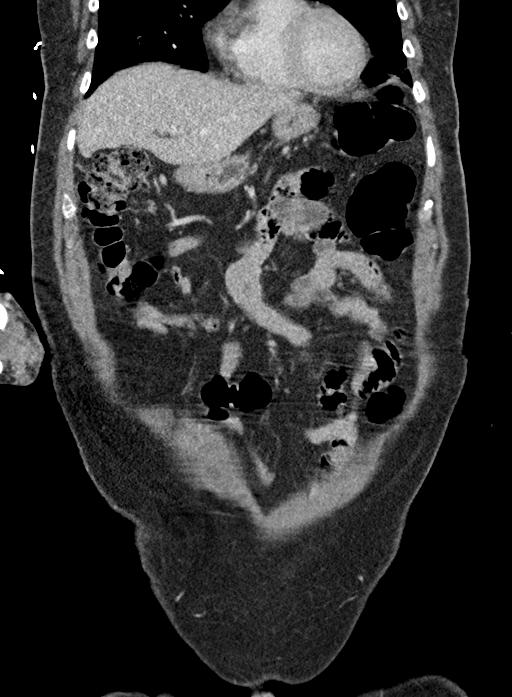
[im 45/101  soft-tissue]
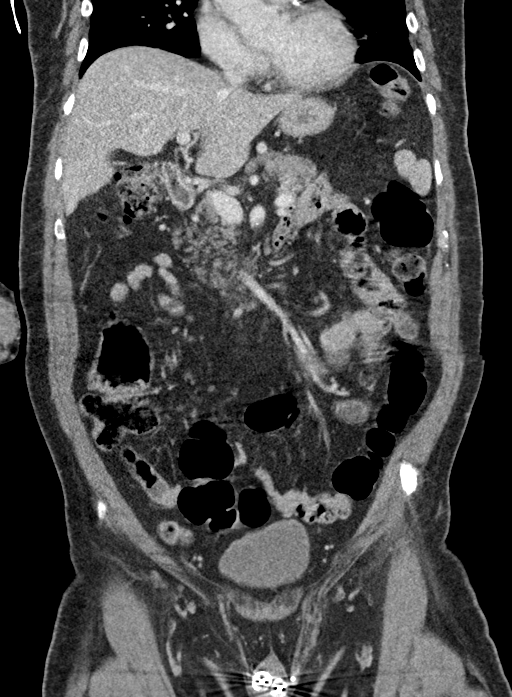
[im 56/101  soft-tissue]
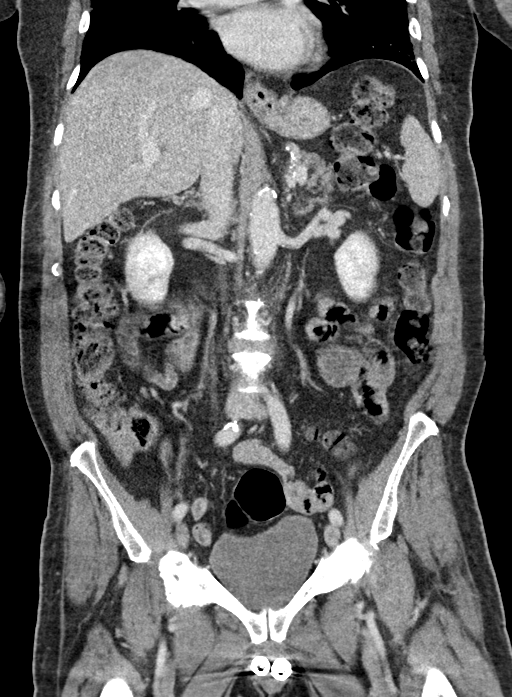

[15 of 46 positions shown; findings below may reference images not displayed]

FINDINGS: Lower chest: There are peripheral ground-glass airspace opacities
noted at the lung bases.Coronary artery calcifications are noted.
The heart size appears relatively normal.

Hepatobiliary: The liver is normal. Status post
cholecystectomy.There is no biliary ductal dilation.

Pancreas: Normal contours without ductal dilatation. No
peripancreatic fluid collection.

Spleen: No splenic laceration or hematoma.

Adrenals/Urinary Tract:

--Adrenal glands: No adrenal hemorrhage.

--Right kidney/ureter: No hydronephrosis or perinephric hematoma.

--Left kidney/ureter: No hydronephrosis or perinephric hematoma.

--Urinary bladder: Unremarkable.

Stomach/Bowel:

--Stomach/Duodenum: No hiatal hernia or other gastric abnormality.
Normal duodenal course and caliber.

--Small bowel: No dilatation or inflammation.

--Colon: No focal abnormality.

--Appendix: Not visualized. No right lower quadrant inflammation or
free fluid.

Vascular/Lymphatic: Atherosclerotic calcification is present within
the non-aneurysmal abdominal aorta, without hemodynamically
significant stenosis. There is a questionable filling defect in the
partially visualized proximal left superficial femoral vein.

--No retroperitoneal lymphadenopathy.

--No mesenteric lymphadenopathy.

--No pelvic or inguinal lymphadenopathy.

Reproductive: Unremarkable

Other: No ascites or free air. The abdominal wall is normal.

Musculoskeletal. There are degenerative changes of both hips. There
is no acute displaced fracture involving the lumbar spine.
Mild-to-moderate multilevel degenerative changes are noted of the
lumbar spine.
IMPRESSION: 1. No acute intra-abdominal abnormality detected.
2. No acute lumbar spine fracture.
3. Incidentally noted peripheral ground-glass airspace opacities at
the lung bases. Findings are concerning for an atypical infectious
process in the appropriate clinical setting.
4. Questionable filling defect in the partially visualized left
superficial femoral vein. Follow-up with a lower extremity venous
ultrasound is recommended to help exclude a lower extremity DVT.

Aortic Atherosclerosis ([WR]-[WR]).

## 2019-07-02 IMAGING — CR DG CHEST 2V
2 series · 2 of 2 positions shown · non-contrast
Comparison: PA and lateral chest [DATE].

CLINICAL DATA: Weakness for 2 weeks. The patient suffered a fall
today.

EXAM:
CHEST - 2 VIEW

[chest lat]
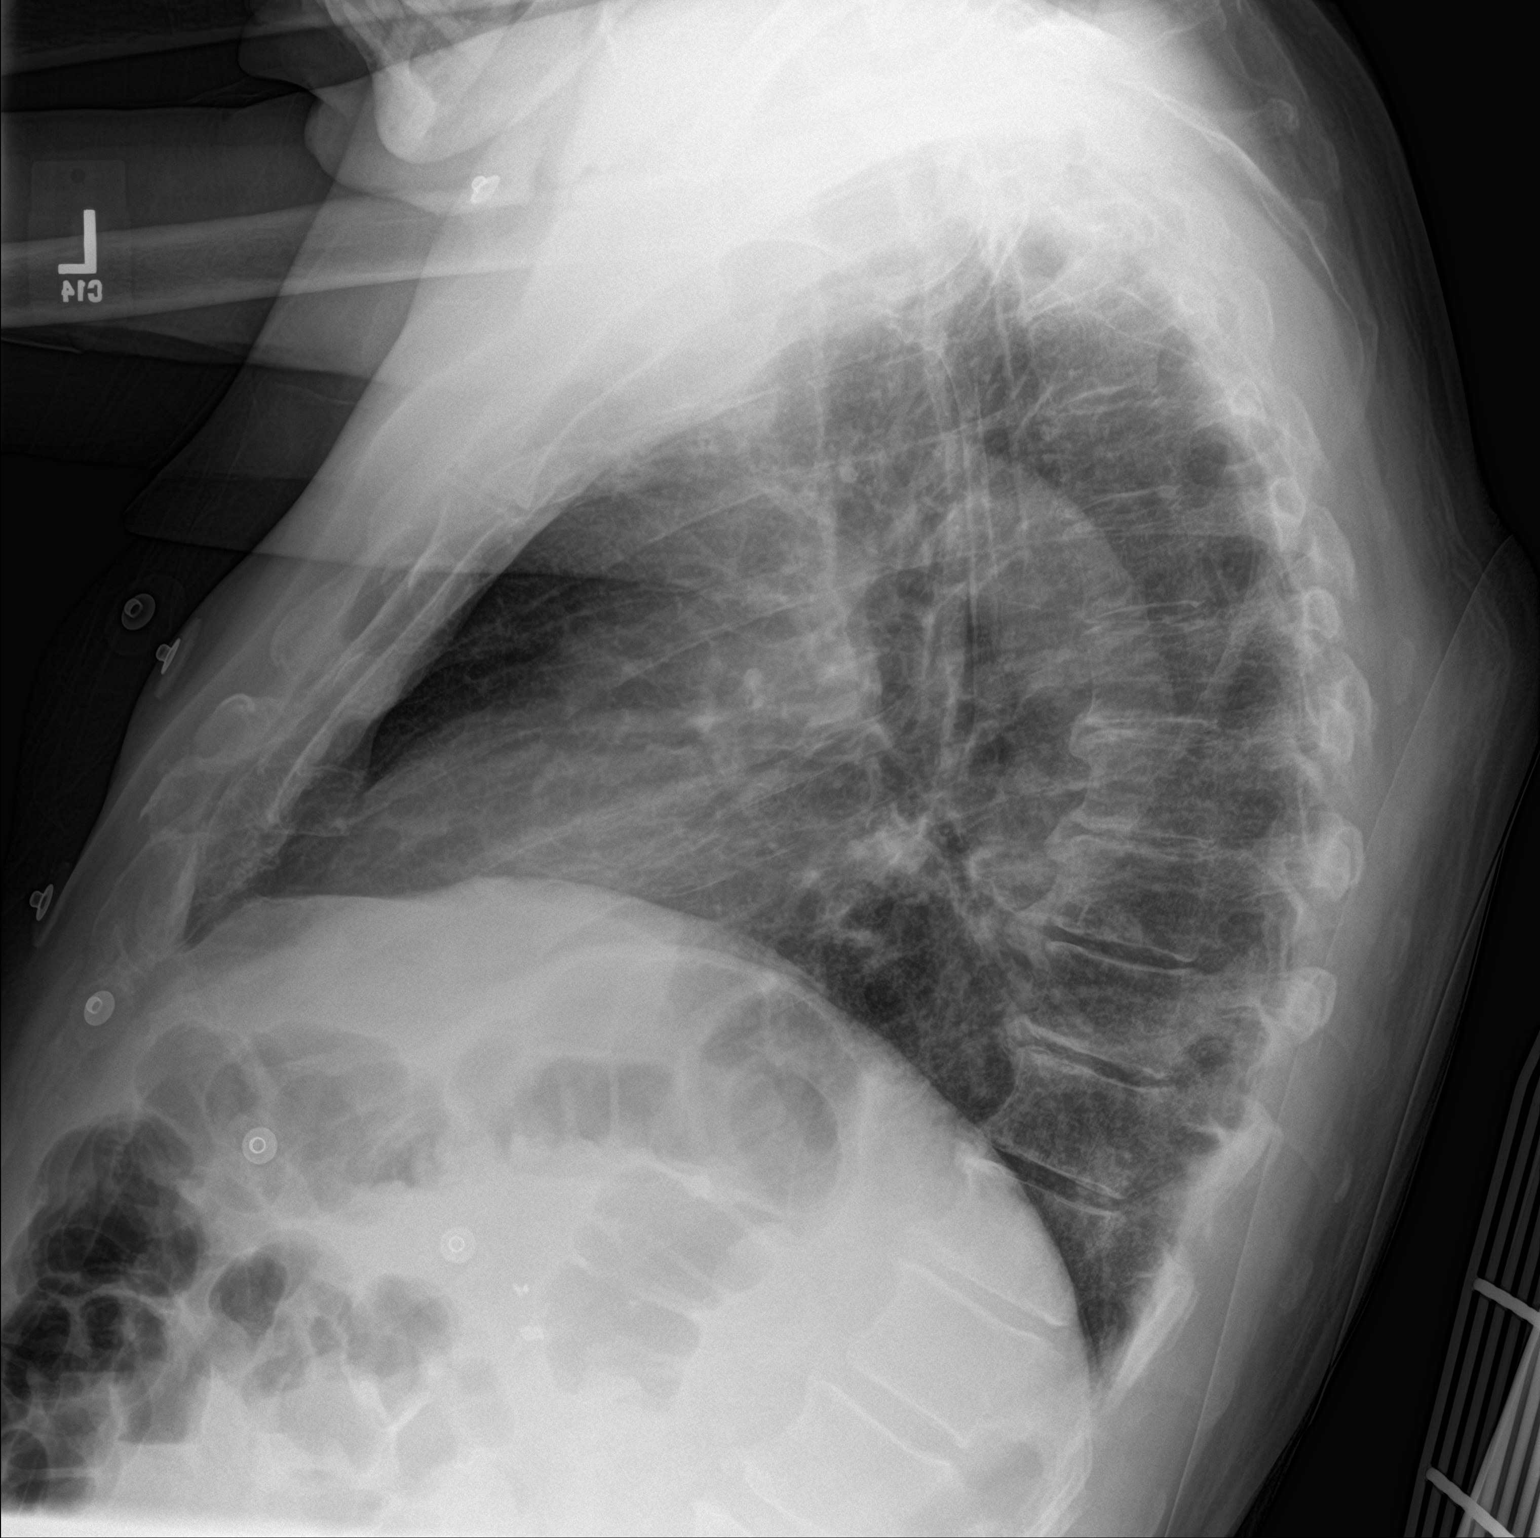

[chest ap]
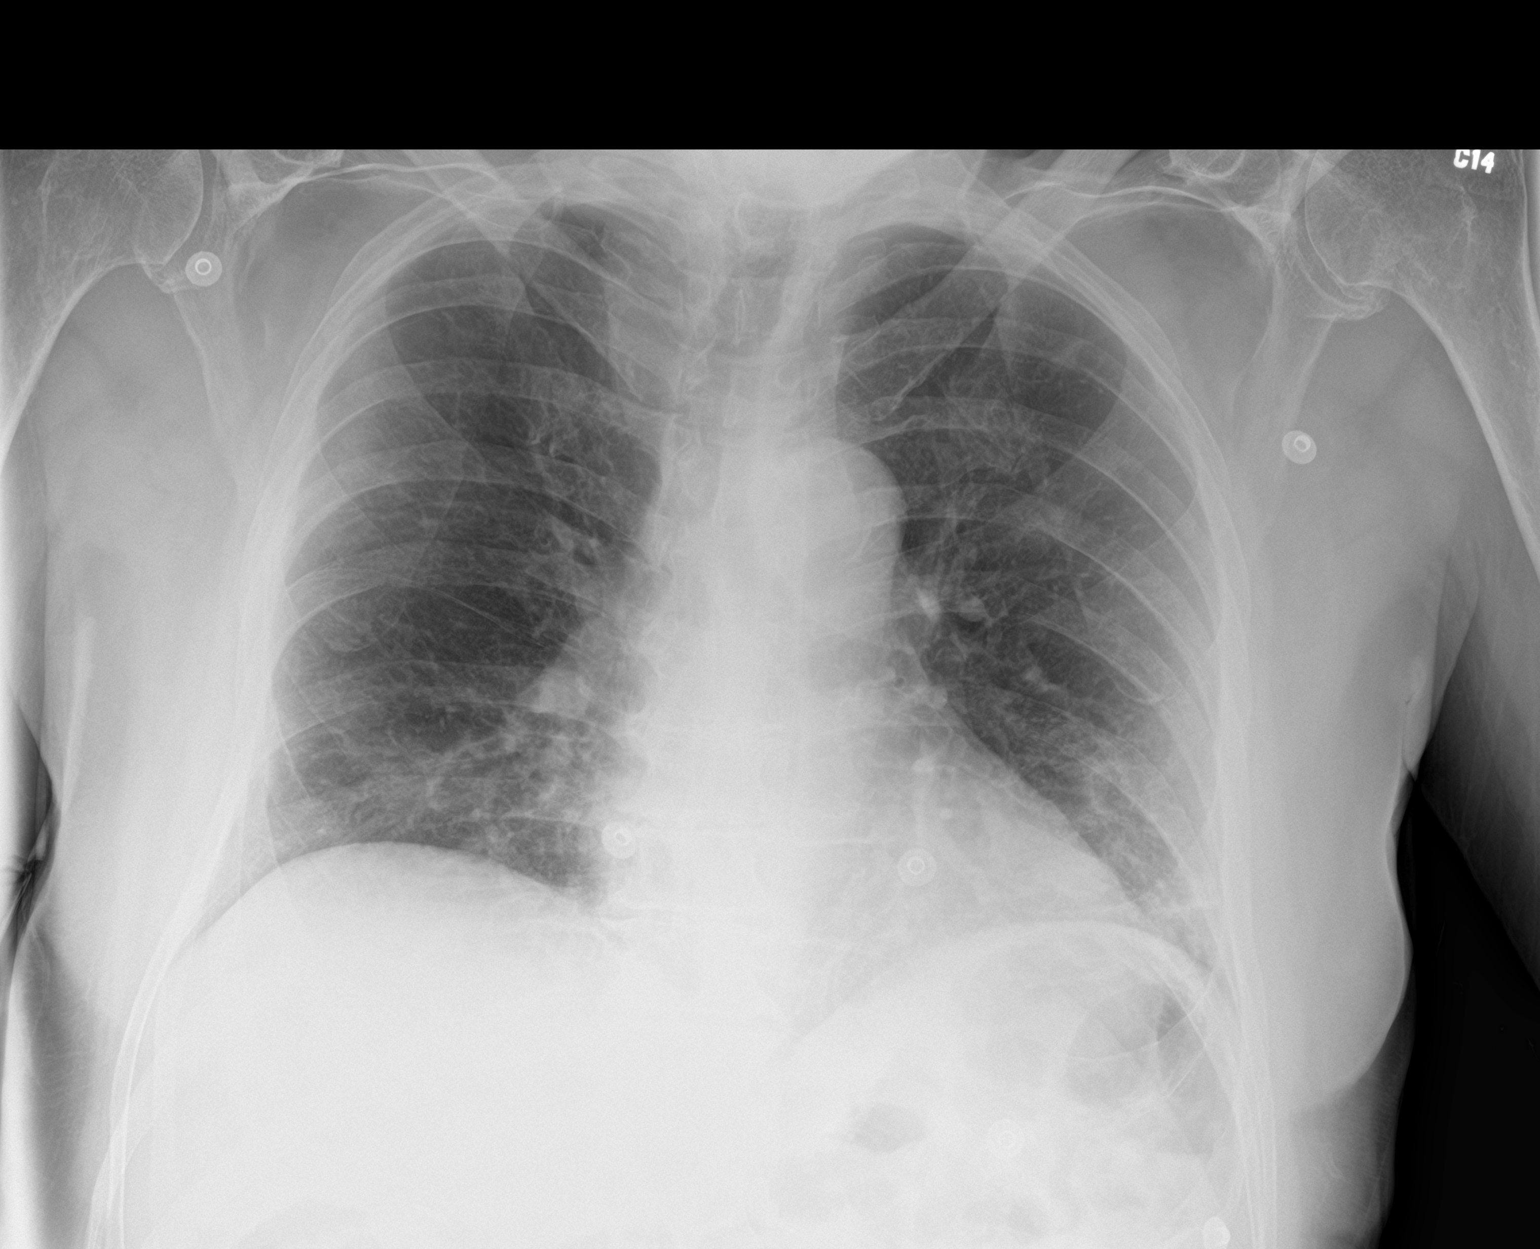

[2 of 2 positions shown; findings below may reference images not displayed]

FINDINGS: Lung volumes are lower than on the comparison examination. Mild,
streaky opacities are seen in the left lung base. No pneumothorax or
pleural effusion. Heart size is normal. No acute or focal bony
abnormality.
IMPRESSION: Mild, streaky left basilar opacities are likely due to atelectasis.
No acute disease.

## 2019-07-02 IMAGING — CT CT HEAD W/O CM
3 of 4 series · 15 of 47 positions shown, 18 images · non-contrast
Comparison: CT head [DATE]

CLINICAL DATA: Head trauma, fell earlier today, weakness, cough,
history coronary artery disease post MI, hypertension, type II
diabetes mellitus

EXAM:
CT HEAD WITHOUT CONTRAST
CT CERVICAL SPINE WITHOUT CONTRAST
TECHNIQUE: Multidetector CT imaging of the head and cervical spine was
performed following the standard protocol without intravenous
contrast. Multiplanar CT image reconstructions of the cervical spine
were also generated.

[Series 3: head 5.0 h30s · axial · 0.49mm/px · z∈[-129,+21]mm · 9 of 36 slices shown, 12 images]
[im 3/36  brain]
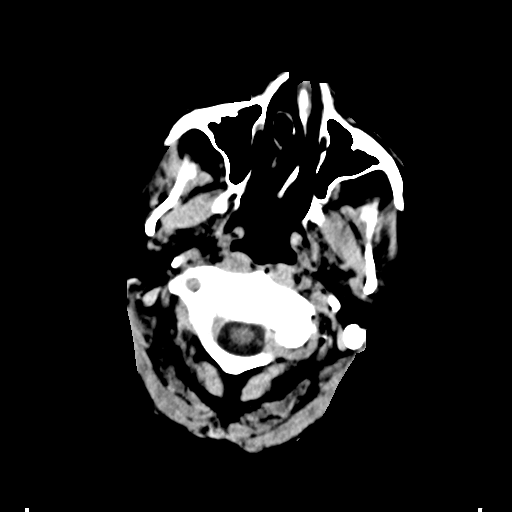
[im 3/36  bone]
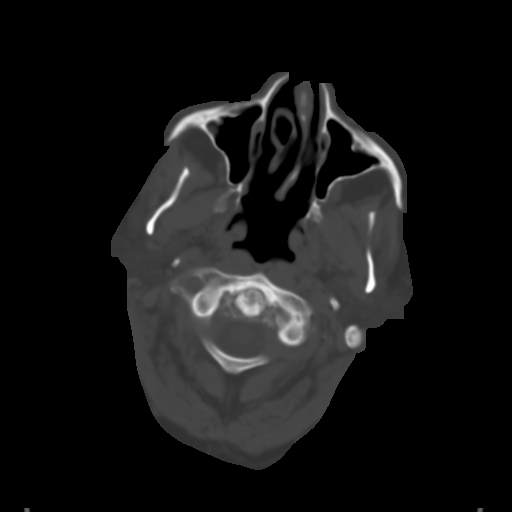
[im 8/36  brain]
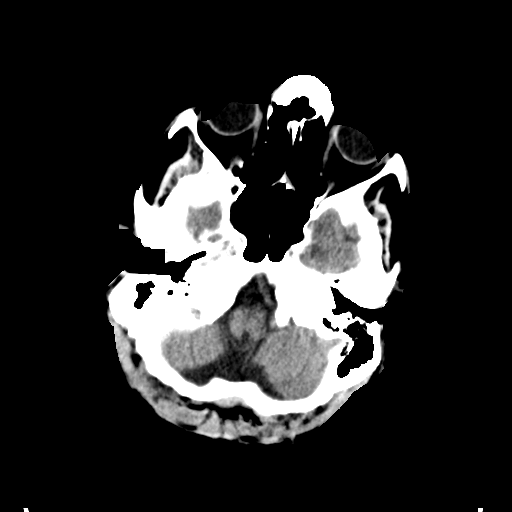
[im 11/36  brain]
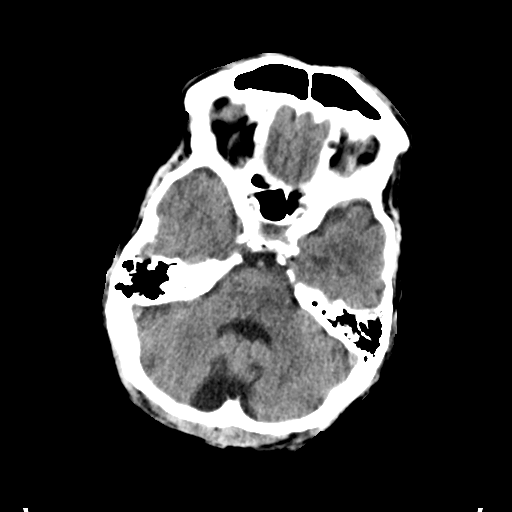
[im 16/36  brain]
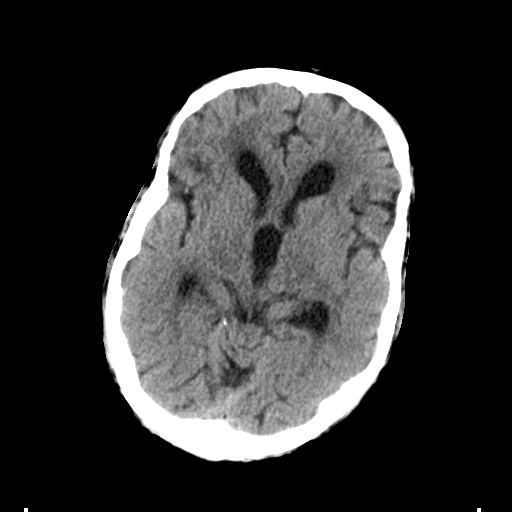
[im 18/36  brain]
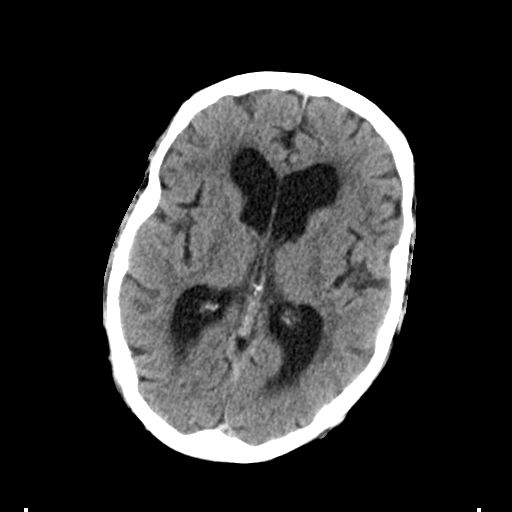
[im 18/36  bone]
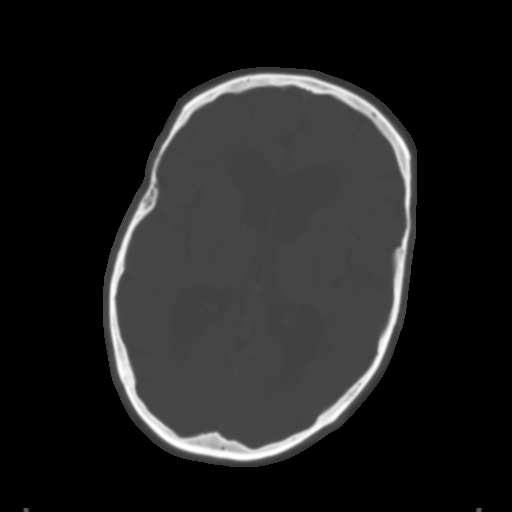
[im 21/36  brain]
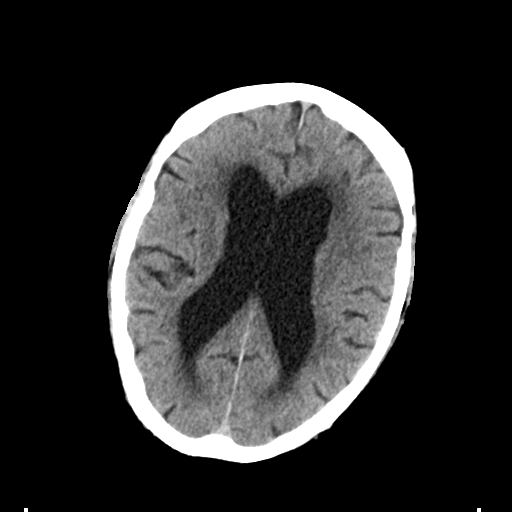
[im 26/36  brain]
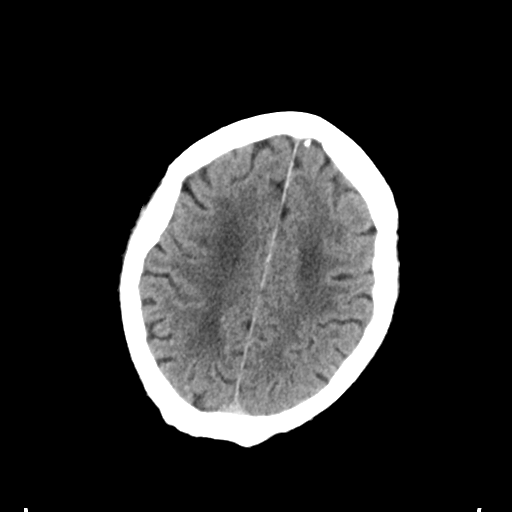
[im 28/36  brain]
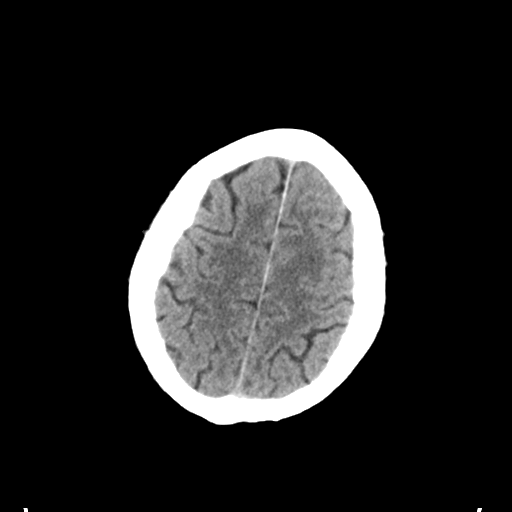
[im 33/36  brain]
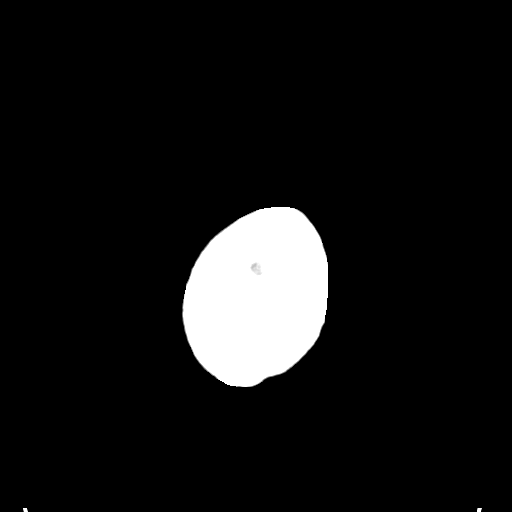
[im 33/36  bone]
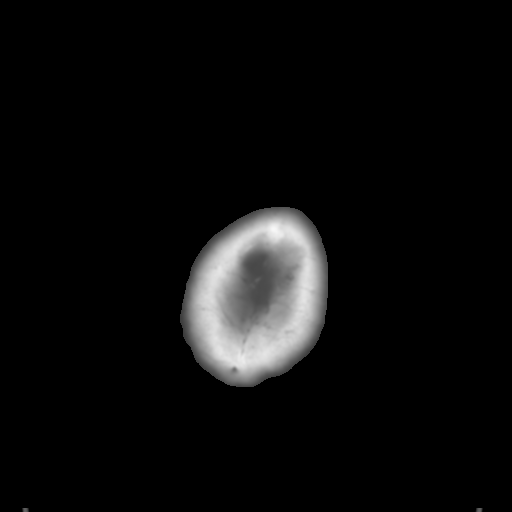

[Series 5: head 3.0 mpr cor · coronal · 0.35mm/px · 3 of 75 slices shown]
[im 25/75  brain]
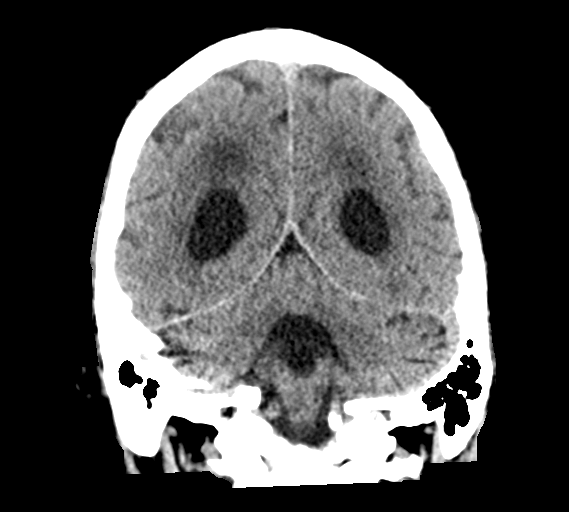
[im 33/75  brain]
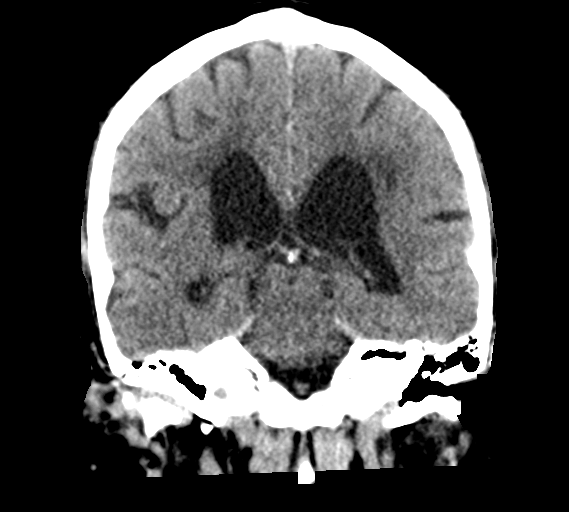
[im 42/75  brain]
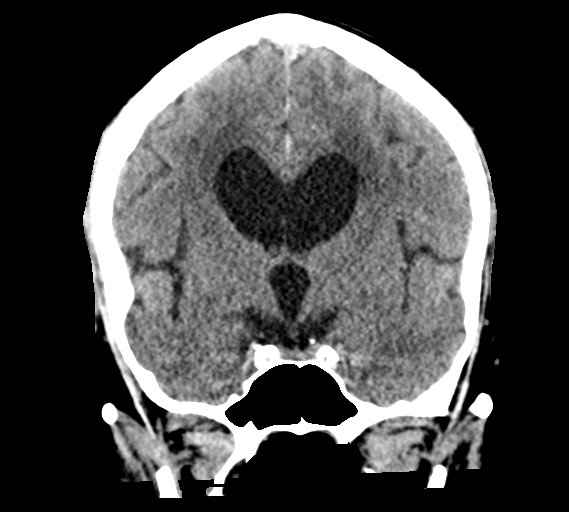

[Series 6: head 3.0 mpr sag · sagittal · 0.36mm/px · 3 of 66 slices shown]
[im 22/66  brain]
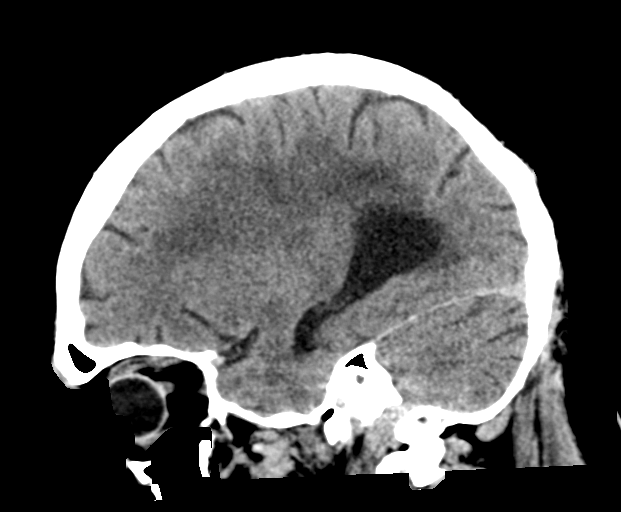
[im 33/66  brain]
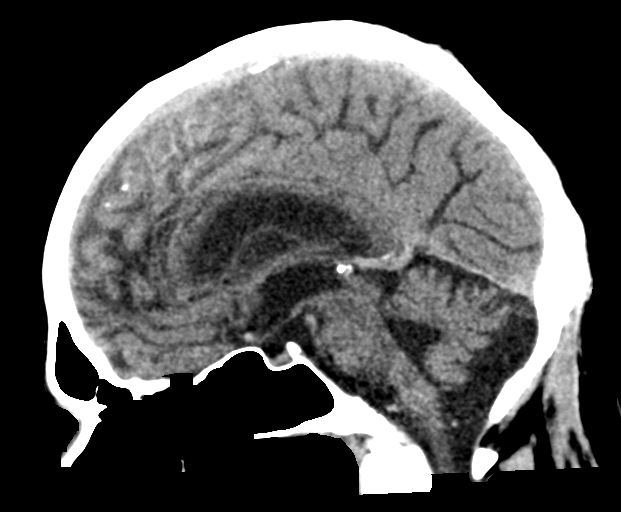
[im 44/66  brain]
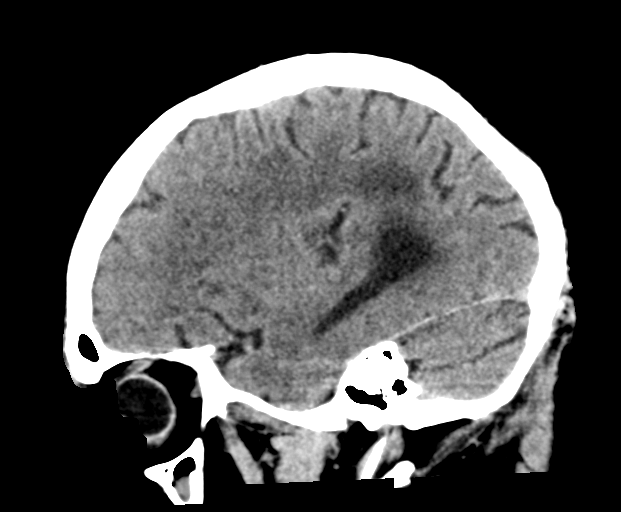

[15 of 47 positions shown; findings below may reference images not displayed]

FINDINGS: CT HEAD FINDINGS

Brain: Generalized atrophy with ex vacuo dilatation of the
ventricular system. Prominent cisterna magna. No midline shift or
mass effect. Small vessel chronic ischemic changes of deep cerebral
white matter. No intracranial hemorrhage, mass lesion, or evidence
of acute infarction. No extra-axial fluid collections.

Vascular: No hyperdense vessels. Atherosclerotic calcifications of
internal carotid arteries and LEFT vertebral artery at skull base

Skull: Intact

Sinuses/Orbits: Clear

Other: N/A

CT CERVICAL SPINE FINDINGS

Alignment: Minimal anterolisthesis at C4-C5. Remaining alignments
normal

Skull base and vertebrae: Osseous demineralization. Significant
degenerative changes at articulation of odontoid process with
anterior arch of C1. Skull base intact. Multilevel disc space
narrowing and endplate spur formation. Multilevel facet degenerative
changes. Encroachment upon LEFT C4-C5 foramen by uncovertebral and
facet hypertrophy. No fracture, additional subluxation or bone
destruction.

Soft tissues and spinal canal: Prevertebral soft tissues normal
thickness.

Disc levels:  No additional abnormalities

Upper chest: Lung apices clear

Other: N/A
IMPRESSION: Atrophy with small vessel chronic ischemic changes of deep cerebral
white matter.

No acute intracranial abnormalities.

Multilevel degenerative disc and facet disease changes of the
cervical spine.

No acute cervical spine abnormalities.

## 2019-07-02 IMAGING — CT CT CERVICAL SPINE W/O CM
3 of 4 series · 12 of 33 positions shown, 14 images · non-contrast
Comparison: CT head [DATE]

CLINICAL DATA: Head trauma, fell earlier today, weakness, cough,
history coronary artery disease post MI, hypertension, type II
diabetes mellitus

EXAM:
CT HEAD WITHOUT CONTRAST
CT CERVICAL SPINE WITHOUT CONTRAST
TECHNIQUE: Multidetector CT imaging of the head and cervical spine was
performed following the standard protocol without intravenous
contrast. Multiplanar CT image reconstructions of the cervical spine
were also generated.

[Series 5: c_spine 2.0 st · axial · 0.35mm/px · z∈[-255,-127]mm · 4 of 96 slices shown, 5 images]
[im 16/96  soft-tissue]
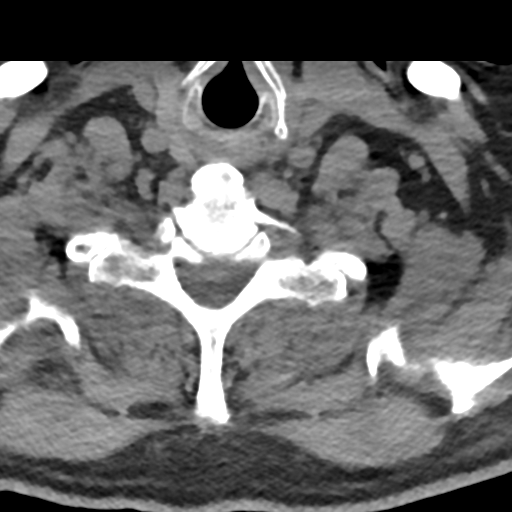
[im 16/96  bone]
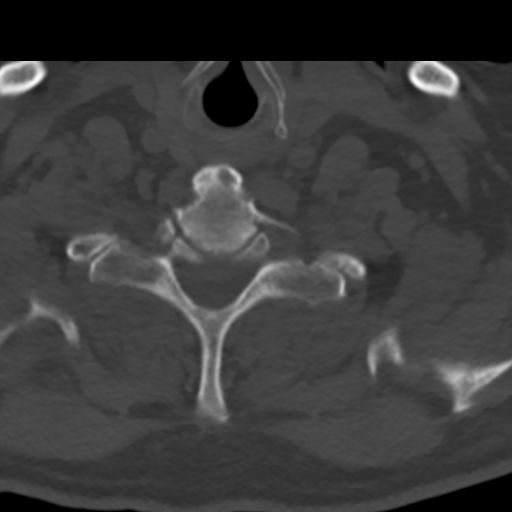
[im 32/96  bone]
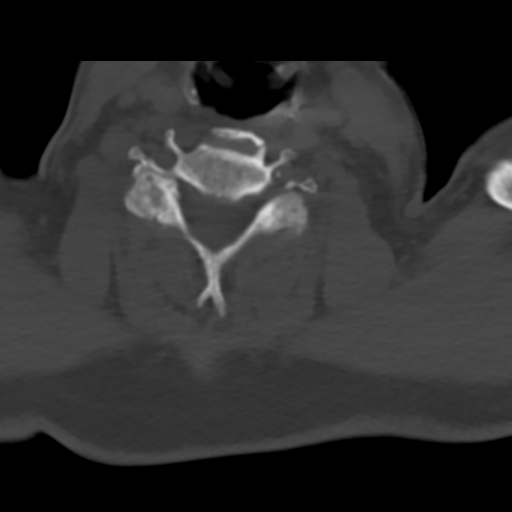
[im 64/96  bone]
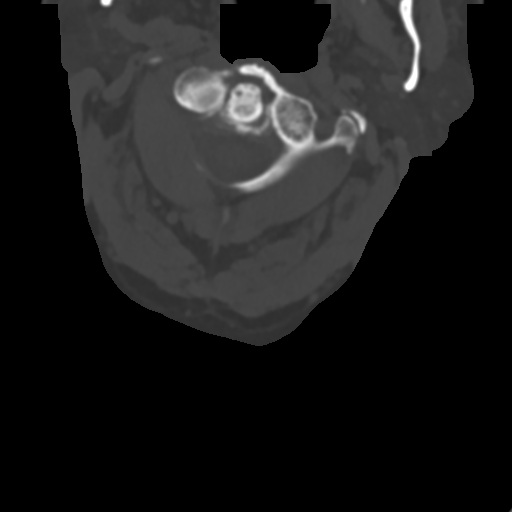
[im 80/96  bone]
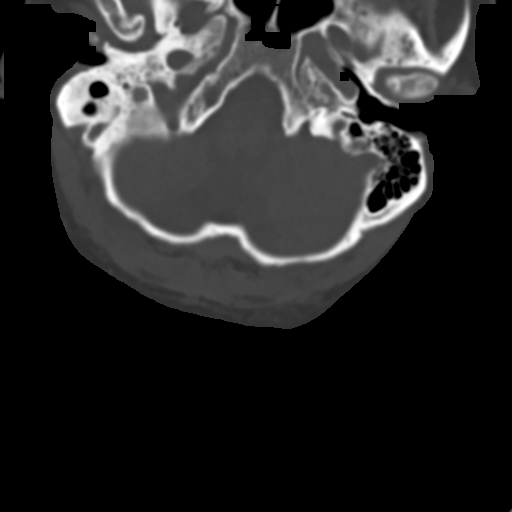

[Series 6: coronal bone · coronal · 0.27mm/px · 3 of 70 slices shown]
[im 14/70  bone]
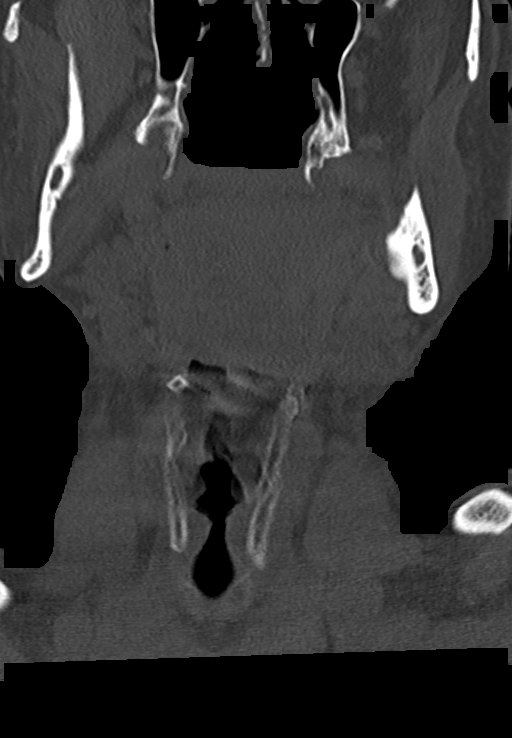
[im 28/70  bone]
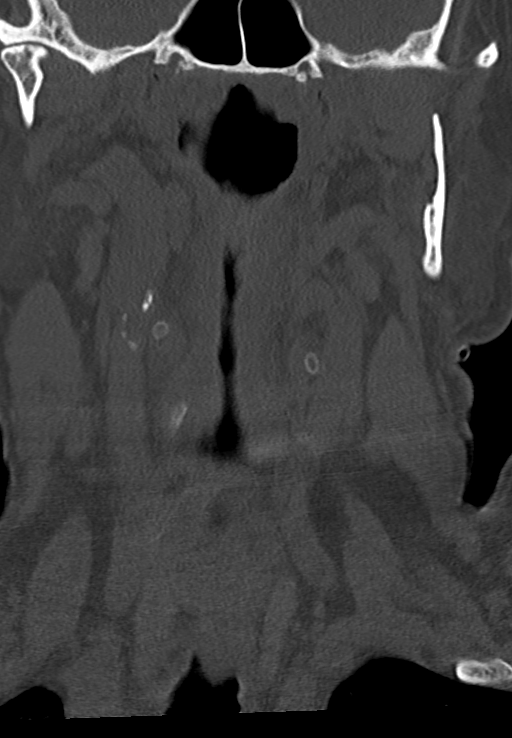
[im 42/70  bone]
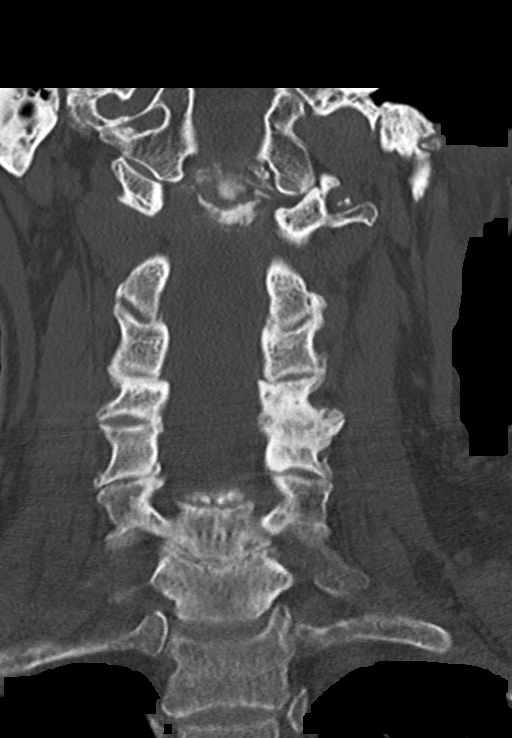

[Series 7: sagittal bone · sagittal · 0.27mm/px · 5 of 66 slices shown, 6 images]
[im 22/66  bone]
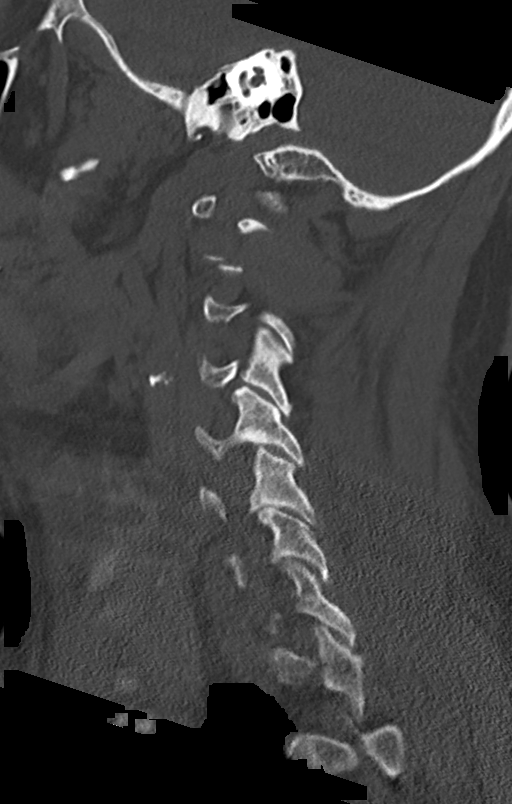
[im 28/66  bone]
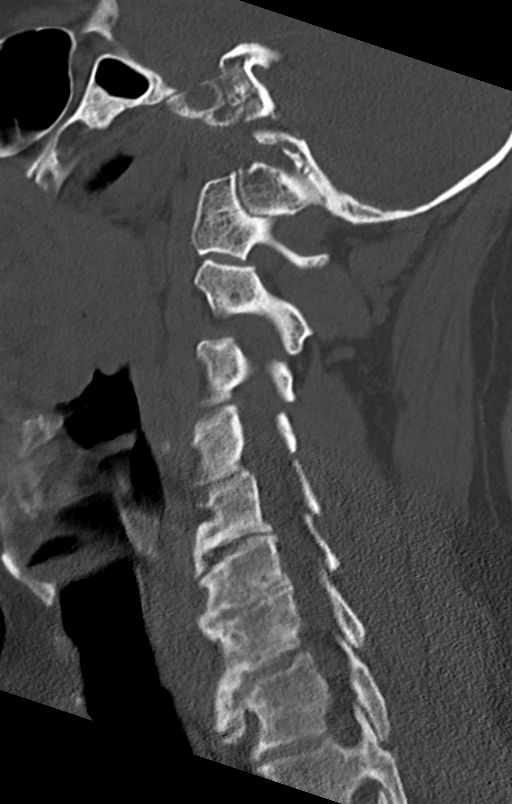
[im 33/66  soft-tissue]
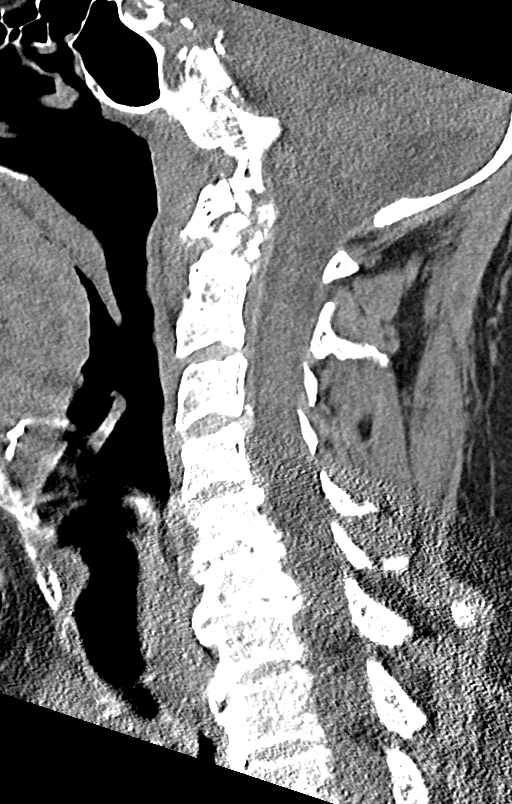
[im 33/66  bone]
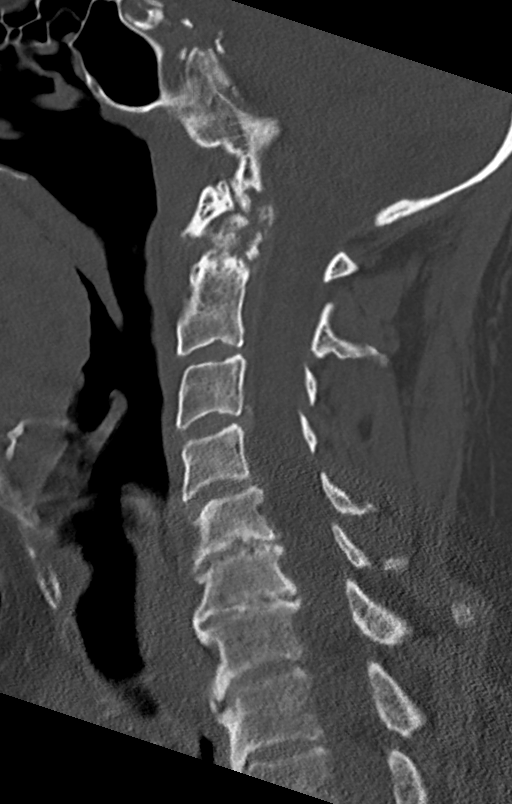
[im 38/66  bone]
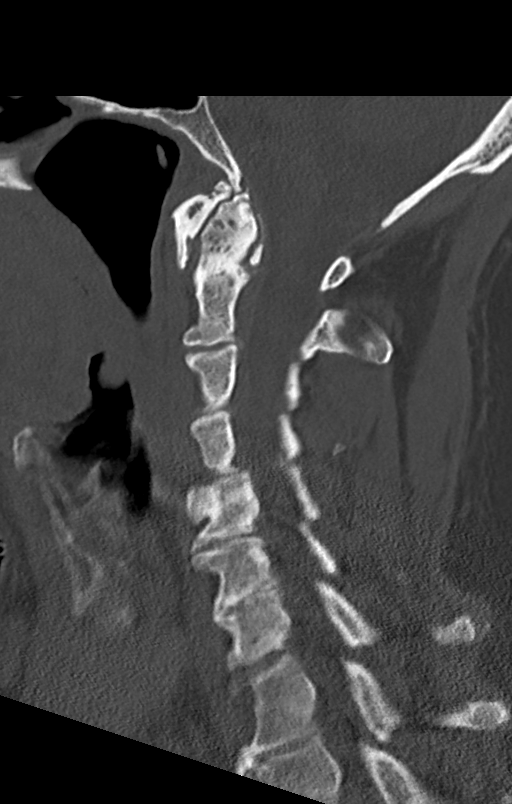
[im 44/66  bone]
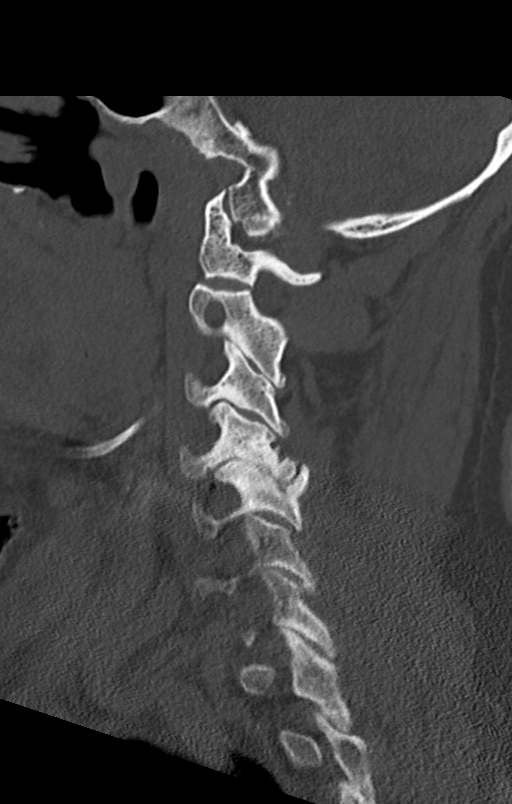

[12 of 33 positions shown; findings below may reference images not displayed]

FINDINGS: CT HEAD FINDINGS

Brain: Generalized atrophy with ex vacuo dilatation of the
ventricular system. Prominent cisterna magna. No midline shift or
mass effect. Small vessel chronic ischemic changes of deep cerebral
white matter. No intracranial hemorrhage, mass lesion, or evidence
of acute infarction. No extra-axial fluid collections.

Vascular: No hyperdense vessels. Atherosclerotic calcifications of
internal carotid arteries and LEFT vertebral artery at skull base

Skull: Intact

Sinuses/Orbits: Clear

Other: N/A

CT CERVICAL SPINE FINDINGS

Alignment: Minimal anterolisthesis at C4-C5. Remaining alignments
normal

Skull base and vertebrae: Osseous demineralization. Significant
degenerative changes at articulation of odontoid process with
anterior arch of C1. Skull base intact. Multilevel disc space
narrowing and endplate spur formation. Multilevel facet degenerative
changes. Encroachment upon LEFT C4-C5 foramen by uncovertebral and
facet hypertrophy. No fracture, additional subluxation or bone
destruction.

Soft tissues and spinal canal: Prevertebral soft tissues normal
thickness.

Disc levels:  No additional abnormalities

Upper chest: Lung apices clear

Other: N/A
IMPRESSION: Atrophy with small vessel chronic ischemic changes of deep cerebral
white matter.

No acute intracranial abnormalities.

Multilevel degenerative disc and facet disease changes of the
cervical spine.

No acute cervical spine abnormalities.

## 2019-07-02 IMAGING — CT CT ANGIO CHEST
1 of 6 series · 19 of 36 positions shown · IV contrast (APPLIED)
Comparison: Radiograph earlier this day

CLINICAL DATA: Shortness of breath. Cough and weakness. Fall
earlier today.

EXAM:
CT ANGIOGRAPHY CHEST WITH CONTRAST
TECHNIQUE: Multidetector CT imaging of the chest was performed using the
standard protocol during bolus administration of intravenous
contrast. Multiplanar CT image reconstructions and MIPs were
obtained to evaluate the vascular anatomy.
CONTRAST:  100mL OMNIPAQUE IOHEXOL 300 MG/ML  SOLN

[Series 4: thins · axial · 0.75mm/px · z∈[-526,-264]mm · 19 of 293 slices shown]
[im 15/293  lung]
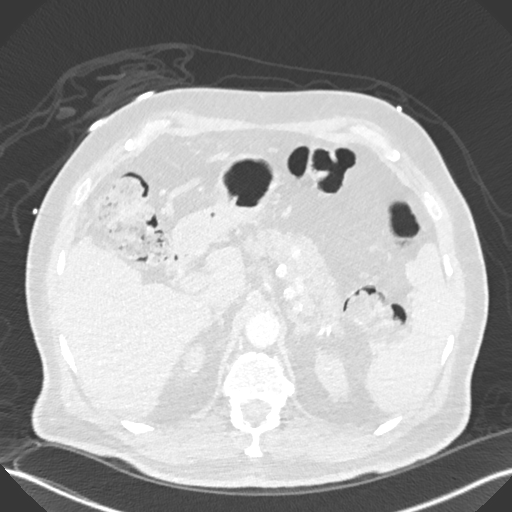
[im 30/293  mediastinal]
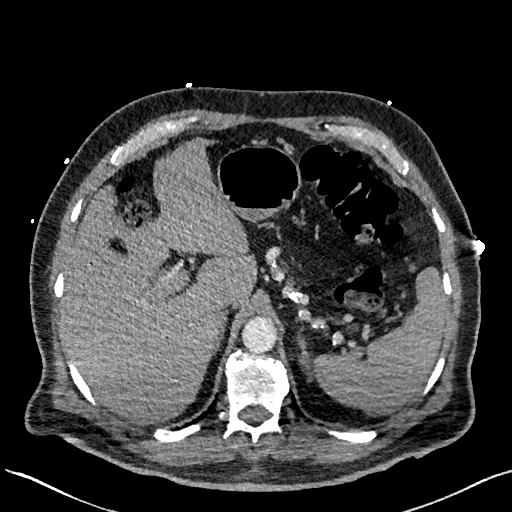
[im 44/293  lung]
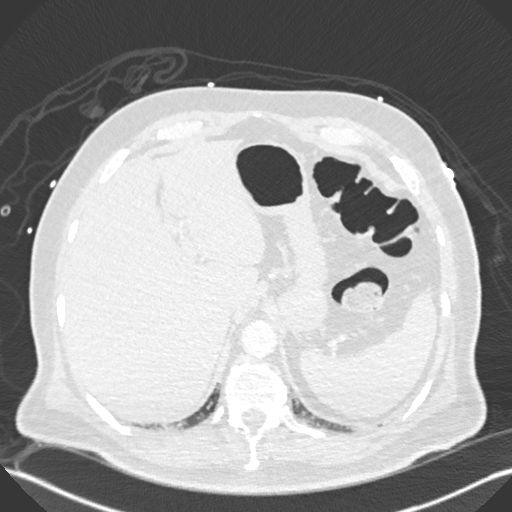
[im 59/293  mediastinal]
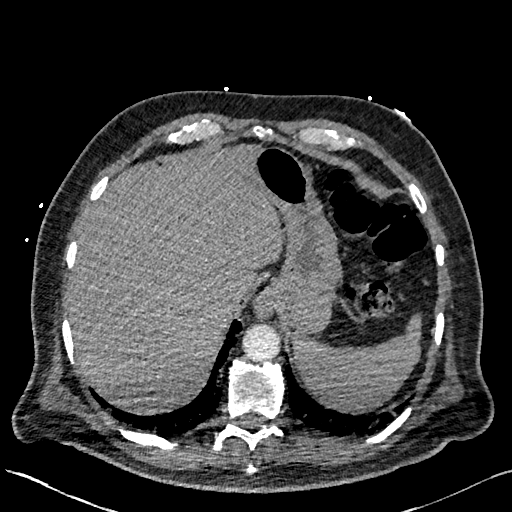
[im 74/293  lung]
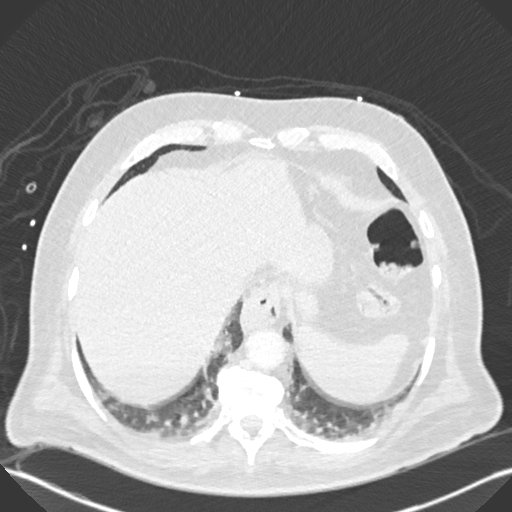
[im 88/293  mediastinal]
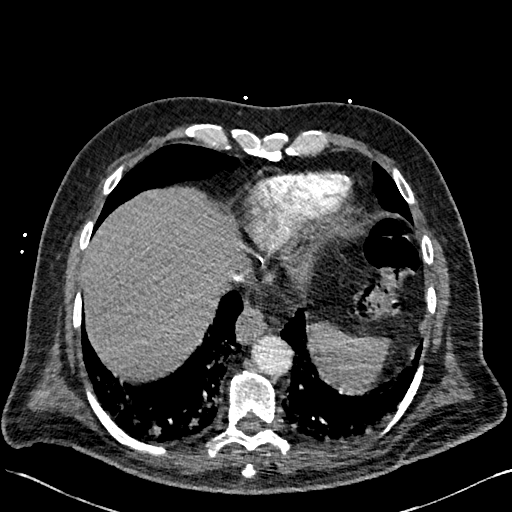
[im 103/293  lung]
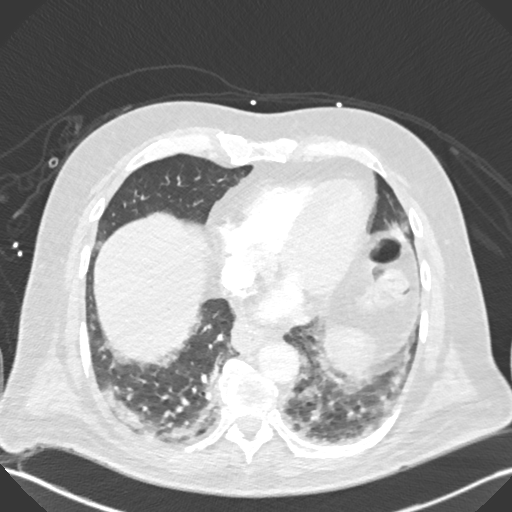
[im 117/293  mediastinal]
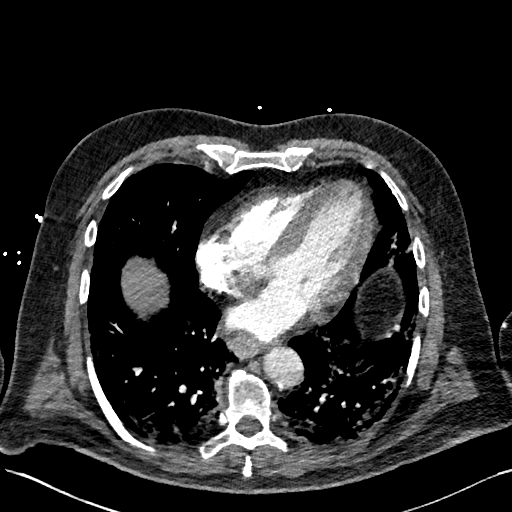
[im 132/293  lung]
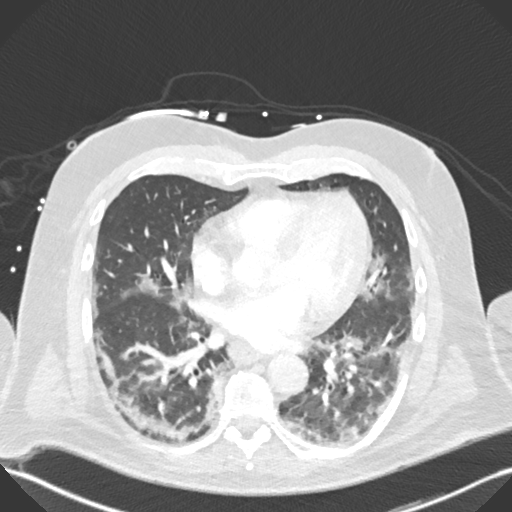
[im 147/293  mediastinal]
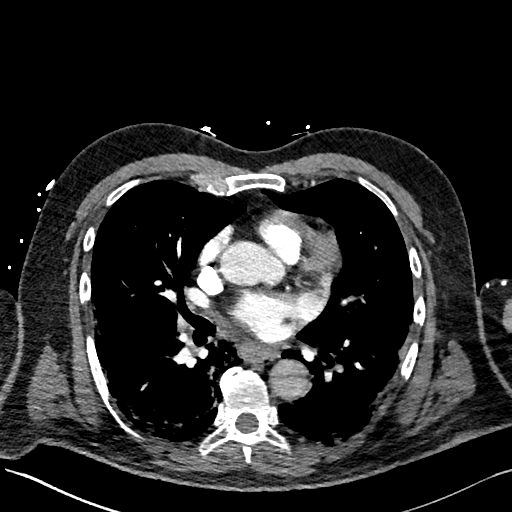
[im 161/293  lung]
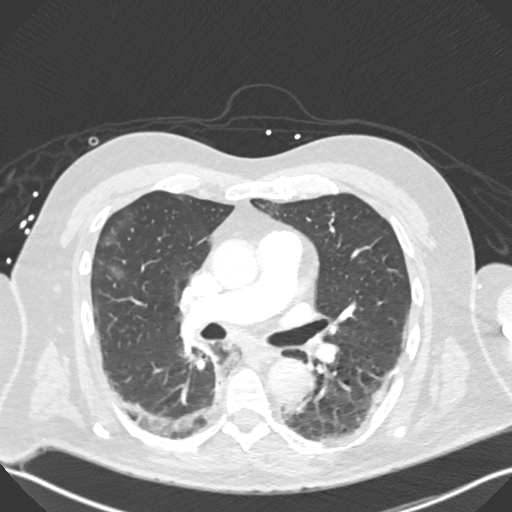
[im 176/293  mediastinal]
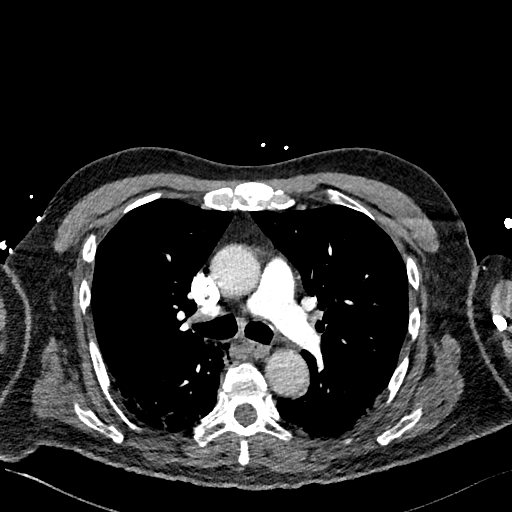
[im 190/293  lung]
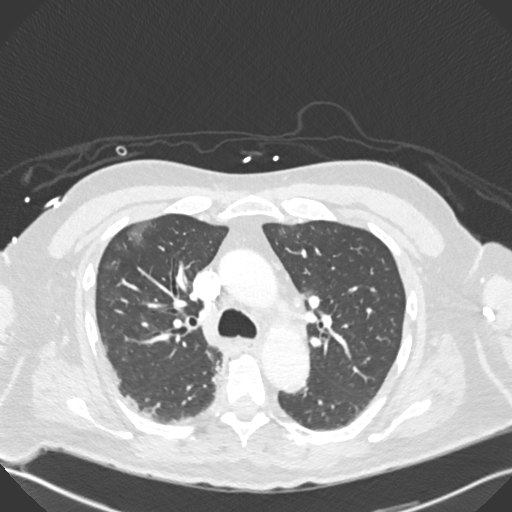
[im 205/293  mediastinal]
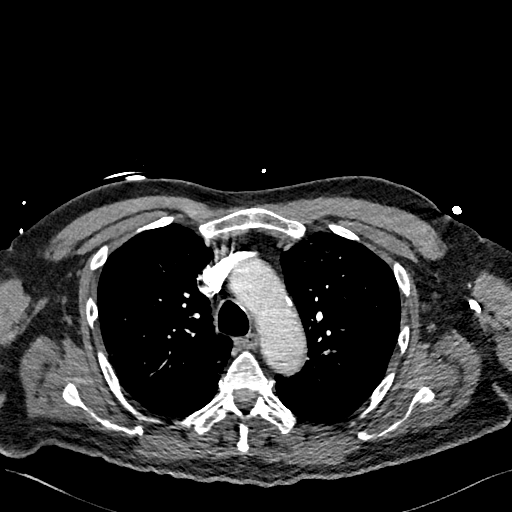
[im 220/293  lung]
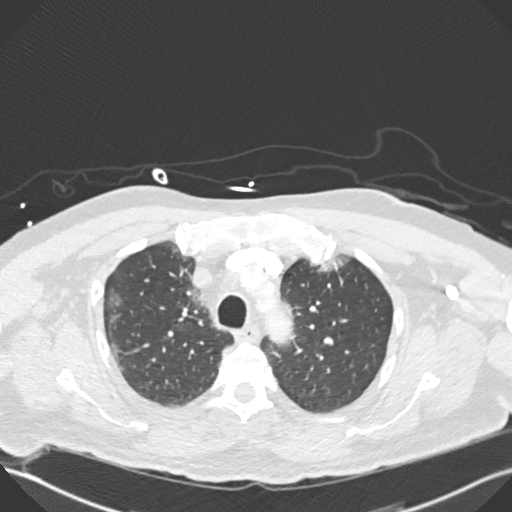
[im 234/293  mediastinal]
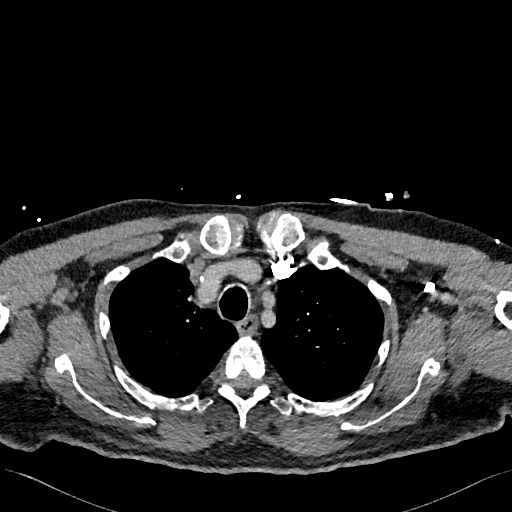
[im 249/293  lung]
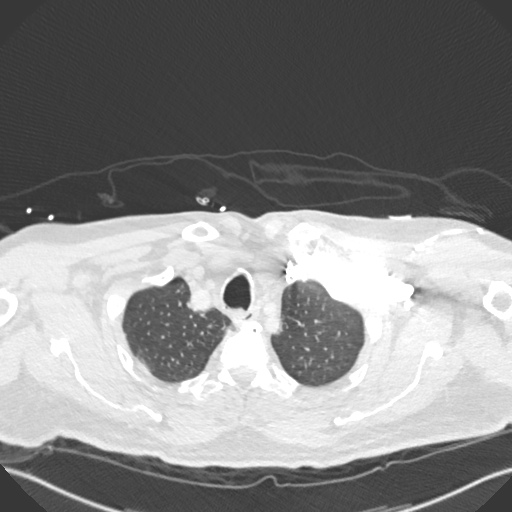
[im 263/293  mediastinal]
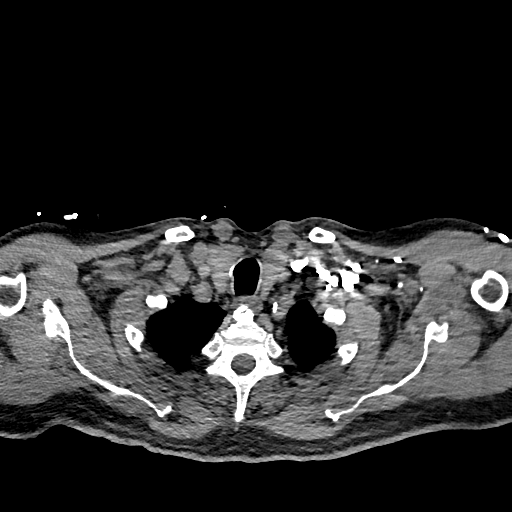
[im 278/293  lung]
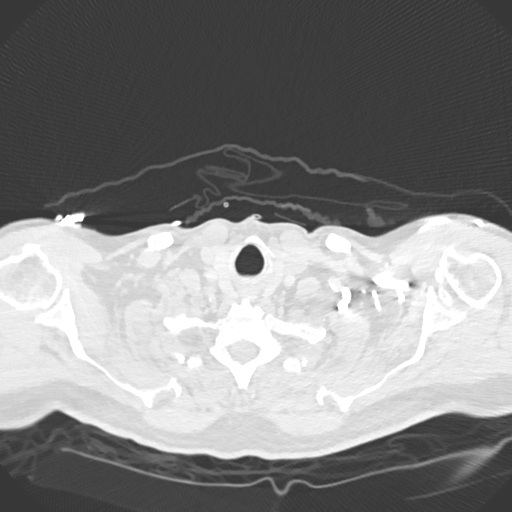

[19 of 36 positions shown; findings below may reference images not displayed]

FINDINGS: Cardiovascular: There are no filling defects within the pulmonary
arteries to suggest pulmonary embolus. Mild aortic atherosclerosis
and tortuosity. No aortic dissection. Heart is normal in size. No
pericardial effusion.

Mediastinum/Nodes: No enlarged mediastinal or hilar lymph nodes. No
thyroid nodule. No esophageal wall thickening. Tiny hiatal hernia.

Lungs/Pleura: Heterogeneous ground-glass opacities in the peripheral
predominant distribution. Findings most prominent in the lower
lobes, however there also patchy opacities in the upper lobes and
right middle lobe. No septal thickening or findings of pulmonary
edema. No pleural fluid. Trachea and mainstem bronchi are patent.

Upper Abdomen: Assessed on concurrent abdominal CT, reported
separately. Cholecystectomy.

Musculoskeletal: There are no acute or suspicious osseous
abnormalities. Degenerative change in the thoracic spine. No acute
fracture. No chest wall contusion.

Review of the MIP images confirms the above findings.
IMPRESSION: 1. No pulmonary embolus.
2. Heterogeneous ground-glass opacities in the peripheral
predominant distribution, most prominent in the lower lobes.
Findings are suspicious for COVID pneumonia. Other inflammatory or
other atypical infectious etiologies are also considered.

Aortic Atherosclerosis ([L0]-[L0]).

## 2019-07-02 IMAGING — CT CT L SPINE W/O CM
3 series · 10 of 33 positions shown, 12 images · IV contrast (APPLIED)
Comparison: CT of the pelvis dated [DATE].

CLINICAL DATA: Abdominal trauma. Weakness and cough. Pain status
post fall.

EXAM:
CT ABDOMEN AND PELVIS WITH CONTRAST
CT LUMBAR SPINE WITHOUT CONTRAST
TECHNIQUE: Multidetector CT imaging of the abdomen and pelvis was performed
using the standard protocol following bolus administration of
intravenous contrast.
Multiplanar CT images of the lumbar spine was reconstructed from
contemporary CT of the Abdomen, and Pelvis
CONTRAST:  100mL OMNIPAQUE IOHEXOL 300 MG/ML  SOLN

[Series 13: thins l-spine · axial · 0.45mm/px · z∈[-780,-563]mm · 2 of 784 slices shown, 3 images]
[im 241/784  soft-tissue]
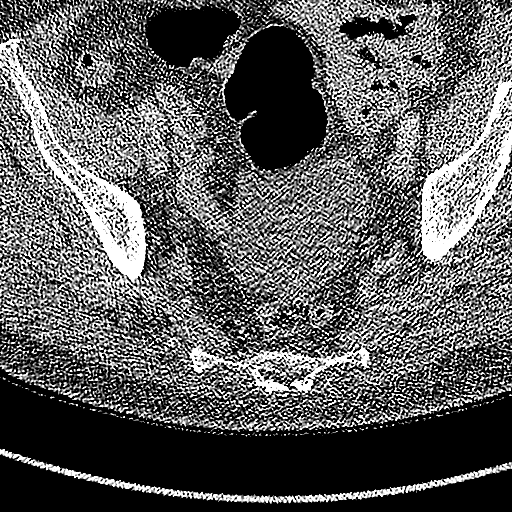
[im 241/784  bone]
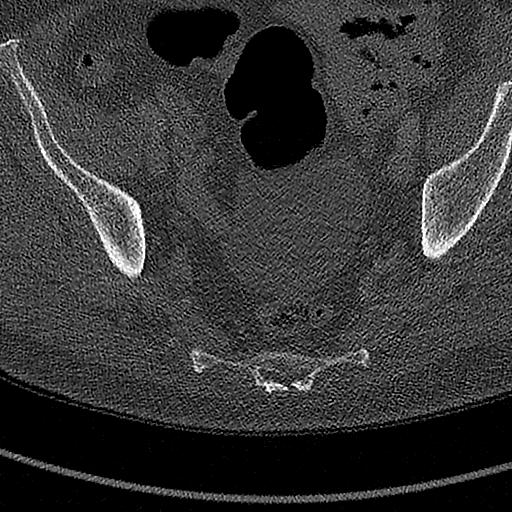
[im 603/784  bone]
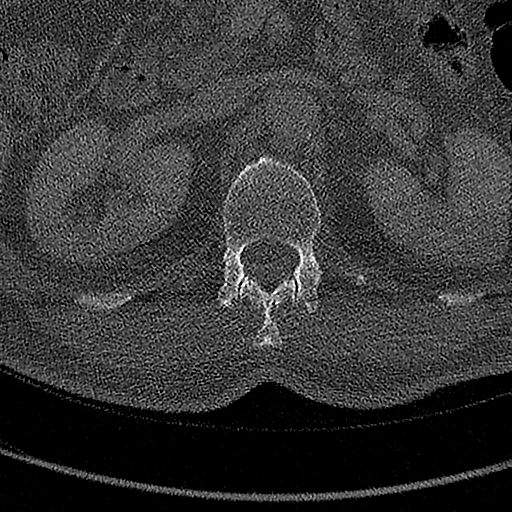

[Series 17: l-spine cor 3 · coronal · 0.43mm/px · 3 of 99 slices shown]
[im 20/99  bone]
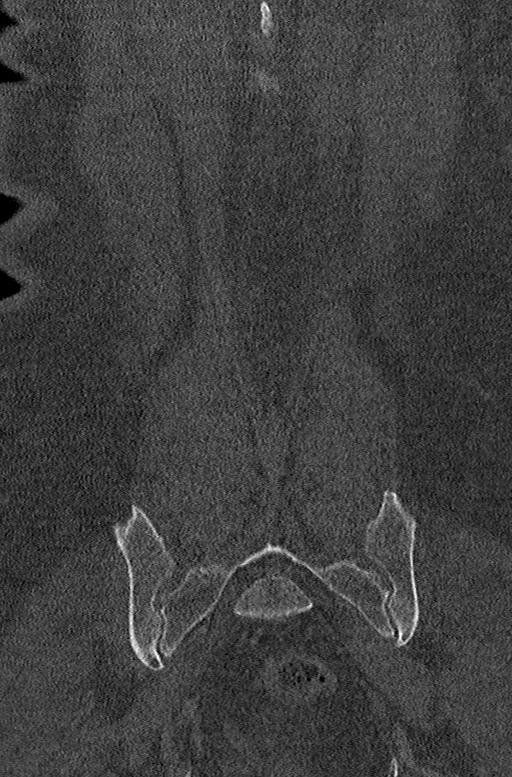
[im 40/99  bone]
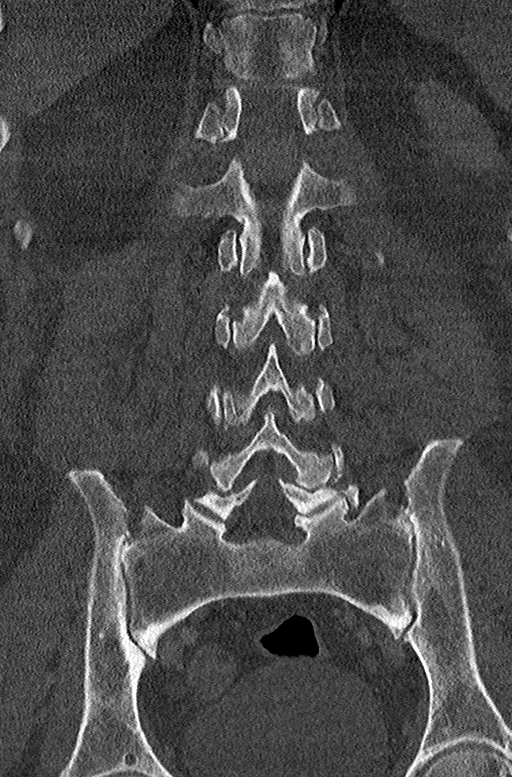
[im 59/99  bone]
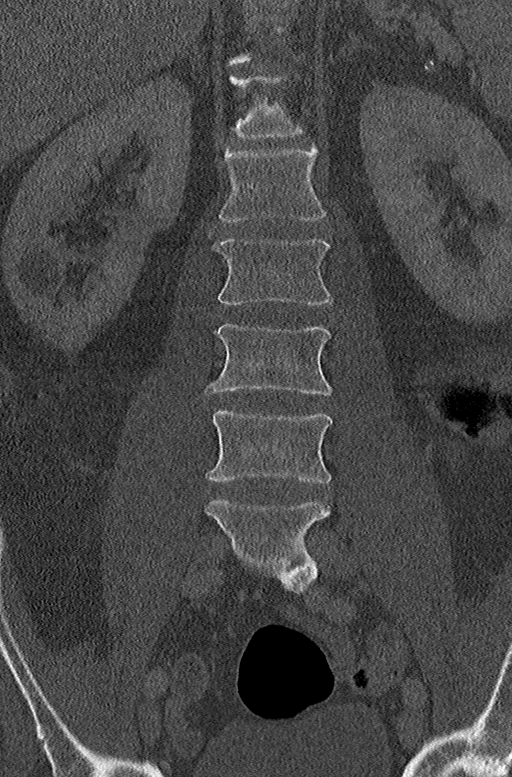

[Series 19: sag 2 l-spine · sagittal · 0.37mm/px · 5 of 113 slices shown, 6 images]
[im 38/113  bone]
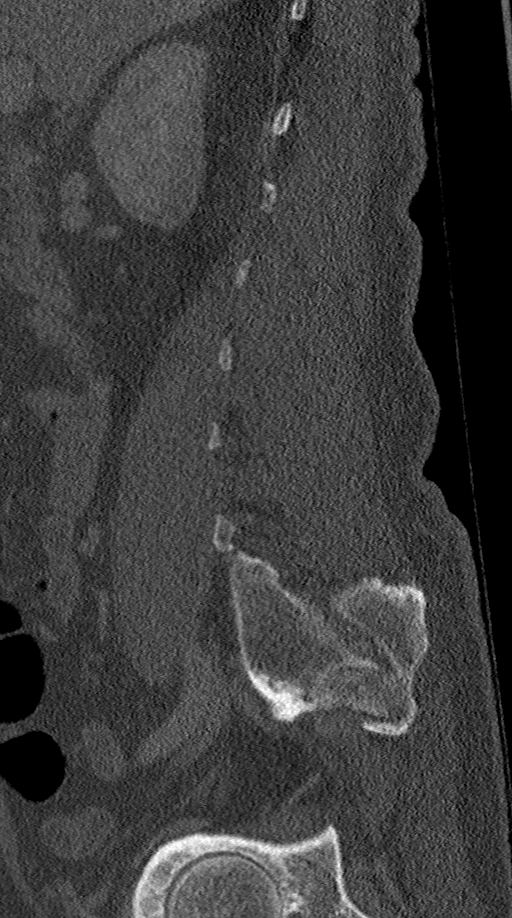
[im 47/113  bone]
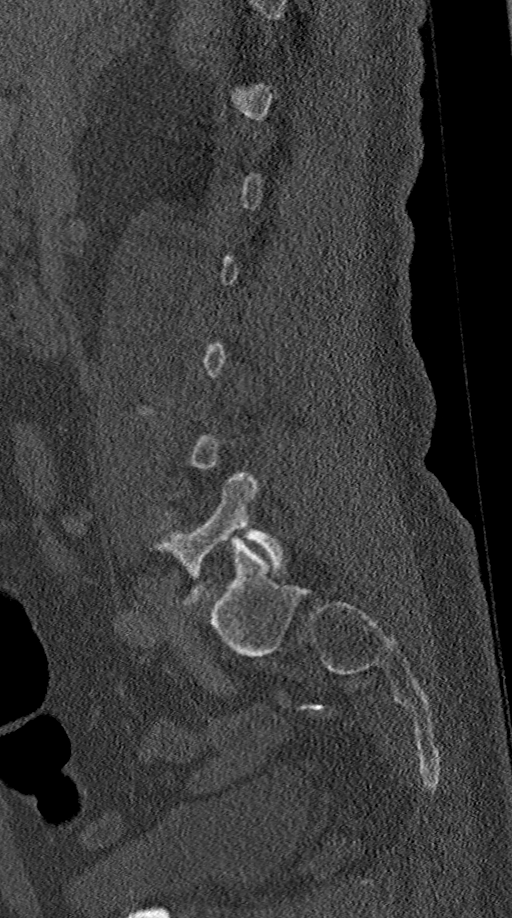
[im 57/113  soft-tissue]
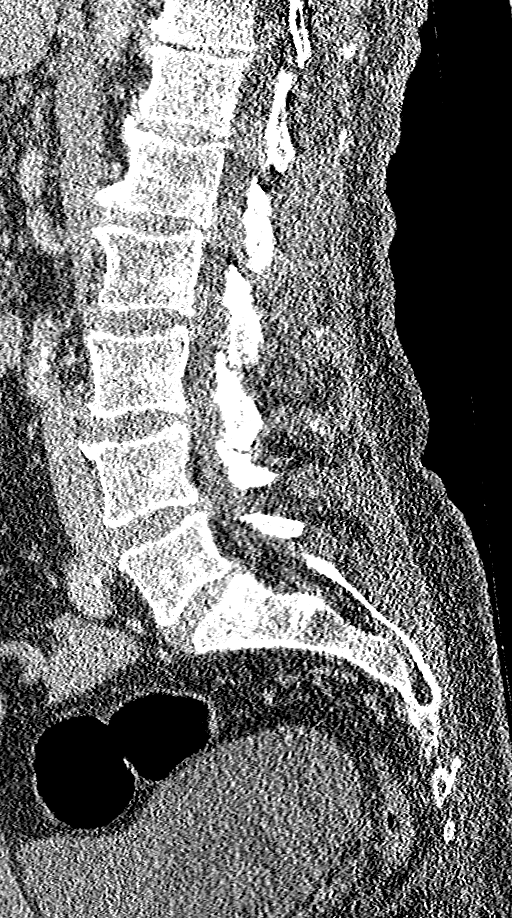
[im 57/113  bone]
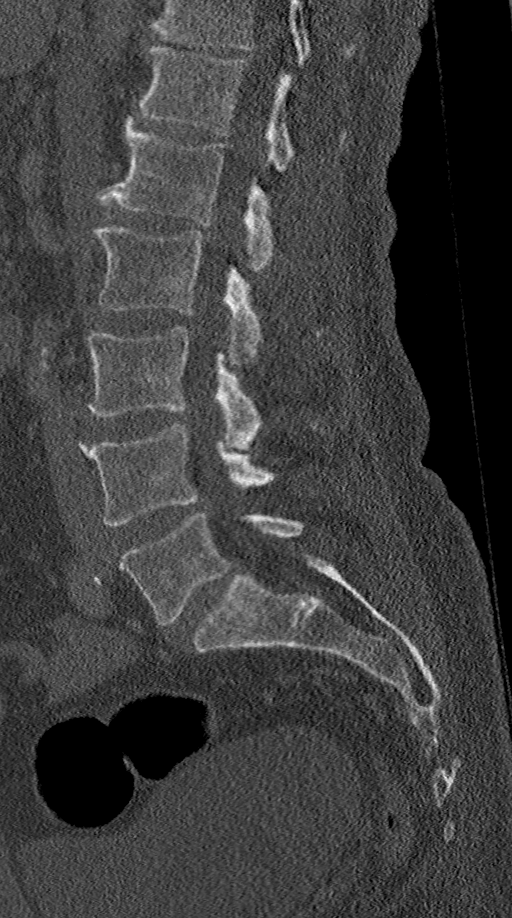
[im 66/113  bone]
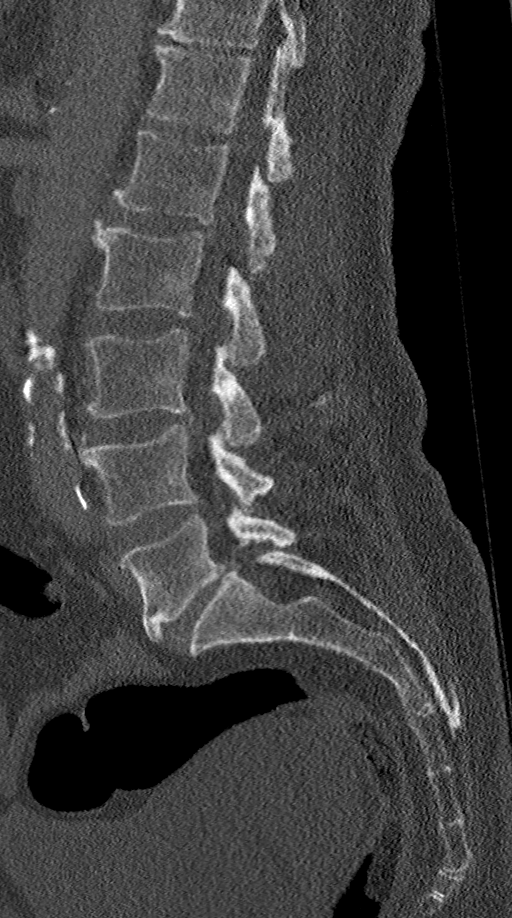
[im 75/113  bone]
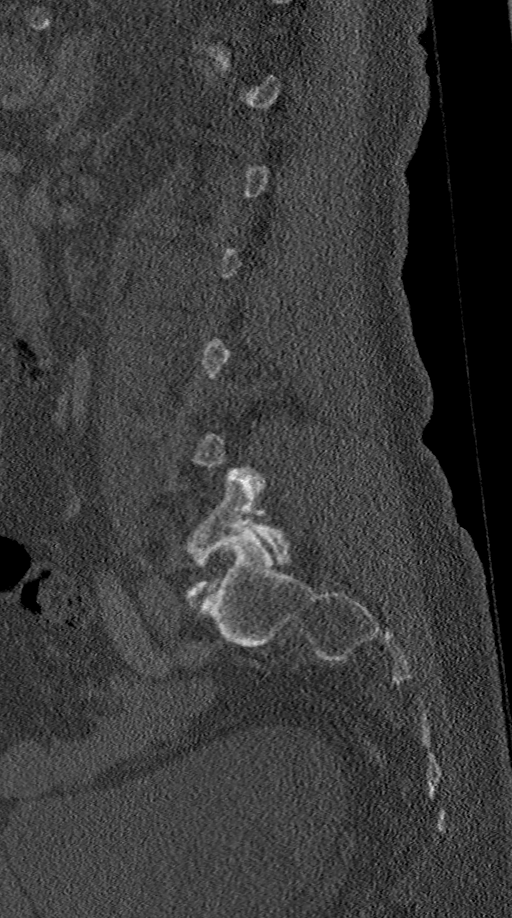

[10 of 33 positions shown; findings below may reference images not displayed]

FINDINGS: Lower chest: There are peripheral ground-glass airspace opacities
noted at the lung bases.Coronary artery calcifications are noted.
The heart size appears relatively normal.

Hepatobiliary: The liver is normal. Status post
cholecystectomy.There is no biliary ductal dilation.

Pancreas: Normal contours without ductal dilatation. No
peripancreatic fluid collection.

Spleen: No splenic laceration or hematoma.

Adrenals/Urinary Tract:

--Adrenal glands: No adrenal hemorrhage.

--Right kidney/ureter: No hydronephrosis or perinephric hematoma.

--Left kidney/ureter: No hydronephrosis or perinephric hematoma.

--Urinary bladder: Unremarkable.

Stomach/Bowel:

--Stomach/Duodenum: No hiatal hernia or other gastric abnormality.
Normal duodenal course and caliber.

--Small bowel: No dilatation or inflammation.

--Colon: No focal abnormality.

--Appendix: Not visualized. No right lower quadrant inflammation or
free fluid.

Vascular/Lymphatic: Atherosclerotic calcification is present within
the non-aneurysmal abdominal aorta, without hemodynamically
significant stenosis. There is a questionable filling defect in the
partially visualized proximal left superficial femoral vein.

--No retroperitoneal lymphadenopathy.

--No mesenteric lymphadenopathy.

--No pelvic or inguinal lymphadenopathy.

Reproductive: Unremarkable

Other: No ascites or free air. The abdominal wall is normal.

Musculoskeletal. There are degenerative changes of both hips. There
is no acute displaced fracture involving the lumbar spine.
Mild-to-moderate multilevel degenerative changes are noted of the
lumbar spine.
IMPRESSION: 1. No acute intra-abdominal abnormality detected.
2. No acute lumbar spine fracture.
3. Incidentally noted peripheral ground-glass airspace opacities at
the lung bases. Findings are concerning for an atypical infectious
process in the appropriate clinical setting.
4. Questionable filling defect in the partially visualized left
superficial femoral vein. Follow-up with a lower extremity venous
ultrasound is recommended to help exclude a lower extremity DVT.

Aortic Atherosclerosis ([WR]-[WR]).

## 2019-07-02 MED ORDER — SODIUM CHLORIDE 0.9 % IV SOLN
200.0000 mg | Freq: Once | INTRAVENOUS | Status: AC
  Administered 2019-07-02: 23:00:00 200 mg via INTRAVENOUS
  Filled 2019-07-02: qty 40

## 2019-07-02 MED ORDER — SODIUM CHLORIDE 0.9 % IV SOLN
100.0000 mg | Freq: Every day | INTRAVENOUS | Status: DC
  Administered 2019-07-03 – 2019-07-04 (×2): 100 mg via INTRAVENOUS
  Filled 2019-07-02 (×2): qty 20

## 2019-07-02 MED ORDER — SODIUM CHLORIDE 0.9% FLUSH: 3.0000 mL | Freq: Once | INTRAVENOUS | Status: DC

## 2019-07-02 MED ORDER — IOHEXOL 300 MG/ML  SOLN
100.0000 mL | Freq: Once | INTRAMUSCULAR | Status: AC | PRN
  Administered 2019-07-02: 20:00:00 100 mL via INTRAVENOUS

## 2019-07-02 NOTE — ED Provider Notes (Signed)
[facility] EMERGENCY DEPARTMENT Provider Note   CSN: [id number] Arrival date & time: 07/02/19  1427     History No chief complaint on file.   William Hunter is a 74 y.o. male history of CAD, diabetes, dyslipidemia, hypertension, rheumatoid arthritis.  Patient reports that for the past 2 weeks he has been experiencing intermittent nausea with occasional nonbloody/nonbilious emesis 1-2 times per day.  Reports this is happened nearly daily over the past 2 weeks and over the past 3-4 days patient has developed generalized weakness.  He reports that at 7:30 AM this morning he stood up to go to the bathroom and thinks that his foot slipped, he fell backwards striking his mid upper back on a table falling to the floor and striking the back of his head on the ground.  He denies any loss of consciousness, reports he was helped off the ground after his wife called EMS.  He reports that EMS told him that his oxygen was "low" but he refused transport to the hospital for further evaluation.  Patient's generalized weakness continued and EMS was again called to bring patient to this ER.  Patient reports mid upper back pain moderate intensity constant aching sensation nonradiating worsened with palpation and without alleviating factors.  He denies loss of consciousness, headache, vision changes, neck pain, chest pain/shortness of breath, abdominal pain, pelvic pain, pain of the extremities or any additional concerns. HPI     Past Medical History:  Diagnosis Date   Anginal pain ([place])    Arthritis    "fingers" (03/09/2012)   Cold feet    "from my diabetes; they stay cold" (03/09/2012)   Coronary artery disease    Coronary artery spasm [facility])    since age 37 Dr Glade Lloyd   Dyslipidemia    Headache(784.0)    Hx of gallstones    Dr Dalbert Batman lap cholecystectomy   Hx of plastic surgery    plastic surgery following a fall off a loading dock ,lost  all his upper teeth     Hypertension    MI, old    at age 49   Migraines    Pneumonia ~ 1962; 1970's   "double"; "regular" (03/09/2012)   Rheumatoid arthritis ([place])    Dr Amil Amen    Slipped cervical disc 2008   Dr Gaynelle Arabian in the past/ Dr Roderic Scarce , chiropractor in 2008   Thyroid disease    Dr Dwyane Dee   Type II diabetes mellitus [facility])     Patient Active Problem List   Diagnosis Date Noted   Migraine with aura and without status migrainosus, not intractable 09/02/2016   Diabetic polyneuropathy associated with type 2 diabetes mellitus ([place]) 09/02/2016   Rheumatoid arthritis ([place]) 09/02/2016   Coronary vasospasm ([place]) 04/06/2014   Gallstones 03/10/2012   Abdominal pain 03/09/2012   MI, old    Coronary artery disease    Hypertension    Dyslipidemia     Past Surgical History:  Procedure Laterality Date   CARDIAC CATHETERIZATION     x 3 with NO stents    CHOLECYSTECTOMY  03/11/2012   Procedure: LAPAROSCOPIC CHOLECYSTECTOMY WITH INTRAOPERATIVE CHOLANGIOGRAM;  Surgeon: Adin Hector, MD;  Location: [facility] OR;  Service: General;  Laterality: N/A;   PENILE PROSTHESIS IMPLANT  1983       Family History  Problem Relation Age of Onset   Heart failure Mother    Cancer - Other Mother        breast   Heart disease  Father    Cancer - Other Sister        tongue cancer   Heart attack Sister     Social History   Tobacco Use   Smoking status: Never Smoker   Smokeless tobacco: Never Used  Substance Use Topics   Alcohol use: No   Drug use: No    Home Medications Prior to Admission medications   Medication Sig Start Date End Date Taking? Authorizing Provider  Adalimumab (HUMIRA) 40 MG/0.4ML PSKT Inject 40 mg into the skin every 14 (fourteen) days. Every other Thursday   Yes [provider]  benazepril (LOTENSIN) 10 MG tablet Take 10 mg by mouth 2 (two) times daily.  05/11/15  Yes [provider]  diltiazem (TIAZAC) 300 MG 24 hr capsule Take 1 capsule (300  mg total) by mouth daily. 06/25/19  Yes Jake Bathe, MD  empagliflozin (JARDIANCE) 10 MG TABS tablet Take 10 mg by mouth daily.   Yes [provider]  ibuprofen (ADVIL) 200 MG tablet Take 400 mg by mouth daily.   Yes [provider]  insulin aspart (NOVOLOG) 100 UNIT/ML injection Inject 20 Units into the skin 3 (three) times daily before meals.   Yes [provider]  Insulin Glargine (LANTUS SOLOSTAR) 100 UNIT/ML Solostar Pen Inject 40 Units into the skin at bedtime.   Yes [provider]  loratadine (CLARITIN) 10 MG tablet Take 10 mg by mouth daily.   Yes [provider]  metFORMIN (GLUCOPHAGE-XR) 500 MG 24 hr tablet Take 1,000 mg by mouth 2 (two) times daily. 04/08/19  Yes [provider]  OVER THE COUNTER MEDICATION Apply 1 application topically daily. Hemp cream - for pain   Yes [provider]  sulfaSALAzine (AZULFIDINE) 500 MG tablet Take 1,500 mg by mouth 2 (two) times daily. 02/23/19  Yes [provider]    Allergies    Codeine, Penicillins, Prednisone, and Wool alcohol [lanolin]  Review of Systems   Review of Systems Ten systems are reviewed and are negative for acute change except as noted in the HPI  Physical Exam Updated Vital Signs BP (!) 142/74    Pulse 90    Temp 98.6 F (37 C) (Oral)    Resp 19    SpO2 96%   Physical Exam Constitutional:      General: He is not in acute distress.    Appearance: Normal appearance. He is well-developed. He is not ill-appearing or diaphoretic.  HENT:     Head: Normocephalic and atraumatic.     Right Ear: External ear normal.     Left Ear: External ear normal.     Nose: Nose normal.  Eyes:     General: Vision grossly intact. Gaze aligned appropriately.     Pupils: Pupils are equal, round, and reactive to light.  Neck:     Trachea: Trachea and phonation normal. No tracheal deviation.  Pulmonary:     Effort: Pulmonary effort is normal. No respiratory distress.    Abdominal:     General: There is no distension.     Palpations: Abdomen is soft.     Tenderness: There is no abdominal tenderness. There is no guarding or rebound.  Musculoskeletal:        General: Normal range of motion.     Cervical back: Normal range of motion.       Back:     Comments: No midline C/T/L spinal tenderness to palpation, no paraspinal muscle tenderness, no deformity, crepitus, or step-off  noted. No sign of injury to the neck or back.   Skin:    General: Skin is warm and dry.  Neurological:     Mental Status: He is alert.     GCS: GCS eye subscore is 4. GCS verbal subscore is 5. GCS motor subscore is 6.     Comments: Speech is clear and goal oriented, follows commands Major Cranial nerves without deficit, no facial droop Moves extremities without ataxia, coordination intact  Psychiatric:        Behavior: Behavior normal.     ED Results / Procedures / Treatments   Labs (all labs ordered are listed, but only abnormal results are displayed) Labs Reviewed  RESPIRATORY PANEL BY RT PCR (FLU A&B, COVID) - Abnormal; Notable for the following components:      Result Value   SARS Coronavirus 2 by RT PCR POSITIVE (*)    All other components within normal limits  BASIC METABOLIC PANEL - Abnormal; Notable for the following components:   CO2 21 (*)    Glucose, Bld 207 (*)    All other components within normal limits  HEPATIC FUNCTION PANEL - Abnormal; Notable for the following components:   Albumin 3.3 (*)    All other components within normal limits  D-DIMER, QUANTITATIVE (NOT AT [facility]) - Abnormal; Notable for the following components:   D-Dimer, Quant 0.63 (*)    All other components within normal limits  CULTURE, BLOOD (ROUTINE X 2)  CULTURE, BLOOD (ROUTINE X 2)  CBC  LIPASE, BLOOD  URINALYSIS, ROUTINE W REFLEX MICROSCOPIC  LACTIC ACID, PLASMA  LACTIC ACID, PLASMA  COMPREHENSIVE METABOLIC PANEL  PROCALCITONIN  LACTATE DEHYDROGENASE  FERRITIN  TRIGLYCERIDES   FIBRINOGEN  C-REACTIVE PROTEIN  CBG MONITORING, ED  POC SARS CORONAVIRUS 2 AG -  ED  TROPONIN I (HIGH SENSITIVITY)  TROPONIN I (HIGH SENSITIVITY)    EKG EKG Interpretation  Date/Time:  Friday July 02 2019 14:33:54 EST Ventricular Rate:  77 PR Interval:  162 QRS Duration: 76 QT Interval:  360 QTC Calculation: 407 R Axis:   39 Text Interpretation: Normal sinus rhythm Normal ECG poor baseline, grossly unchanged Confirmed by Richardean Canal [phone]) on 07/02/2019 6:14:56 PM   Radiology DG Chest 2 View  Result Date: 07/02/2019 CLINICAL DATA:  Weakness for 2 weeks. The patient suffered a fall today. EXAM: CHEST - 2 VIEW COMPARISON:  PA and lateral chest 09/16/2016. FINDINGS: Lung volumes are lower than on the comparison examination. Mild, streaky opacities are seen in the left lung base. No pneumothorax or pleural effusion. Heart size is normal. No acute or focal bony abnormality. IMPRESSION: Mild, streaky left basilar opacities are likely due to atelectasis. No acute disease. Electronically Signed   By: Drusilla Kanner M.D.   On: 07/02/2019 15:05   CT Head Wo Contrast  Result Date: 07/02/2019 CLINICAL DATA:  Head trauma, fell earlier today, weakness, cough, history coronary artery disease post MI, hypertension, type II diabetes mellitus EXAM: CT HEAD WITHOUT CONTRAST CT CERVICAL SPINE WITHOUT CONTRAST TECHNIQUE: Multidetector CT imaging of the head and cervical spine was performed following the standard protocol without intravenous contrast. Multiplanar CT image reconstructions of the cervical spine were also generated. COMPARISON:  CT head 09/16/2016 FINDINGS: CT HEAD FINDINGS Brain: Generalized atrophy with ex vacuo dilatation of the ventricular system. Prominent cisterna magna. No midline shift or mass effect. Small vessel chronic ischemic changes of deep cerebral white matter. No intracranial hemorrhage, mass lesion, or evidence of acute infarction. No extra-axial fluid collections.  Vascular: No hyperdense vessels. Atherosclerotic calcifications of internal carotid arteries and LEFT vertebral artery at skull base Skull: Intact Sinuses/Orbits: Clear Other: N/A CT CERVICAL SPINE FINDINGS Alignment: Minimal anterolisthesis at C4-C5. Remaining alignments normal Skull base and vertebrae: Osseous demineralization. Significant degenerative changes at articulation of odontoid process with anterior arch of C1. Skull base intact. Multilevel disc space narrowing and endplate spur formation. Multilevel facet degenerative changes. Encroachment upon LEFT C4-C5 foramen by uncovertebral and facet hypertrophy. No fracture, additional subluxation or bone destruction. Soft tissues and spinal canal: Prevertebral soft tissues normal thickness. Disc levels:  No additional abnormalities Upper chest: Lung apices clear Other: N/A IMPRESSION: Atrophy with small vessel chronic ischemic changes of deep cerebral white matter. No acute intracranial abnormalities. Multilevel degenerative disc and facet disease changes of the cervical spine. No acute cervical spine abnormalities. Electronically Signed   By: Ulyses Southward M.D.   On: 07/02/2019 21:03   CT Angio Chest PE W and/or Wo Contrast  Result Date: 07/02/2019 CLINICAL DATA:  Shortness of breath. Cough and weakness. Fall earlier today. EXAM: CT ANGIOGRAPHY CHEST WITH CONTRAST TECHNIQUE: Multidetector CT imaging of the chest was performed using the standard protocol during bolus administration of intravenous contrast. Multiplanar CT image reconstructions and MIPs were obtained to evaluate the vascular anatomy. CONTRAST:  OMNIPAQUE IOHEXOL 300 MG/ML  SOLN COMPARISON:  Radiograph earlier this day FINDINGS: Cardiovascular: There are no filling defects within the pulmonary arteries to suggest pulmonary embolus. Mild aortic atherosclerosis and tortuosity. No aortic dissection. Heart is normal in size. No pericardial effusion. Mediastinum/Nodes: No enlarged mediastinal or  hilar lymph nodes. No thyroid nodule. No esophageal wall thickening. Tiny hiatal hernia. Lungs/Pleura: Heterogeneous ground-glass opacities in the peripheral predominant distribution. Findings most prominent in the lower lobes, however there also patchy opacities in the upper lobes and right middle lobe. No septal thickening or findings of pulmonary edema. No pleural fluid. Trachea and mainstem bronchi are patent. Upper Abdomen: Assessed on concurrent abdominal CT, reported separately. Cholecystectomy. Musculoskeletal: There are no acute or suspicious osseous abnormalities. Degenerative change in the thoracic spine. No acute fracture. No chest wall contusion. Review of the MIP images confirms the above findings. IMPRESSION: 1. No pulmonary embolus. 2. Heterogeneous ground-glass opacities in the peripheral predominant distribution, most prominent in the lower lobes. Findings are suspicious for COVID pneumonia. Other inflammatory or other atypical infectious etiologies are also considered. Aortic Atherosclerosis (ICD10-I70.0). Electronically Signed   By: Narda Rutherford M.D.   On: 07/02/2019 21:08   CT Cervical Spine Wo Contrast  Result Date: 07/02/2019 CLINICAL DATA:  Head trauma, fell earlier today, weakness, cough, history coronary artery disease post MI, hypertension, type II diabetes mellitus EXAM: CT HEAD WITHOUT CONTRAST CT CERVICAL SPINE WITHOUT CONTRAST TECHNIQUE: Multidetector CT imaging of the head and cervical spine was performed following the standard protocol without intravenous contrast. Multiplanar CT image reconstructions of the cervical spine were also generated. COMPARISON:  CT head 09/16/2016 FINDINGS: CT HEAD FINDINGS Brain: Generalized atrophy with ex vacuo dilatation of the ventricular system. Prominent cisterna magna. No midline shift or mass effect. Small vessel chronic ischemic changes of deep cerebral white matter. No intracranial hemorrhage, mass lesion, or evidence of acute  infarction. No extra-axial fluid collections. Vascular: No hyperdense vessels. Atherosclerotic calcifications of internal carotid arteries and LEFT vertebral artery at skull base Skull: Intact Sinuses/Orbits: Clear Other: N/A CT CERVICAL SPINE FINDINGS Alignment: Minimal anterolisthesis at C4-C5. Remaining alignments normal Skull base and vertebrae: Osseous demineralization. Significant degenerative changes at articulation of odontoid process  with anterior arch of C1. Skull base intact. Multilevel disc space narrowing and endplate spur formation. Multilevel facet degenerative changes. Encroachment upon LEFT C4-C5 foramen by uncovertebral and facet hypertrophy. No fracture, additional subluxation or bone destruction. Soft tissues and spinal canal: Prevertebral soft tissues normal thickness. Disc levels:  No additional abnormalities Upper chest: Lung apices clear Other: N/A IMPRESSION: Atrophy with small vessel chronic ischemic changes of deep cerebral white matter. No acute intracranial abnormalities. Multilevel degenerative disc and facet disease changes of the cervical spine. No acute cervical spine abnormalities. Electronically Signed   By: Ulyses Southward M.D.   On: 07/02/2019 21:03   CT ABDOMEN PELVIS W CONTRAST  Result Date: 07/02/2019 CLINICAL DATA:  Abdominal trauma. Weakness and cough. Pain status post fall. EXAM: CT ABDOMEN AND PELVIS WITH CONTRAST CT LUMBAR SPINE WITHOUT CONTRAST TECHNIQUE: Multidetector CT imaging of the abdomen and pelvis was performed using the standard protocol following bolus administration of intravenous contrast. Multiplanar CT images of the lumbar spine was reconstructed from contemporary CT of the Abdomen, and Pelvis CONTRAST:  OMNIPAQUE IOHEXOL 300 MG/ML  SOLN COMPARISON:  CT of the pelvis dated 09/16/2016. FINDINGS: Lower chest: There are peripheral ground-glass airspace opacities noted at the lung bases.Coronary artery calcifications are noted. The heart size appears  relatively normal. Hepatobiliary: The liver is normal. Status post cholecystectomy.There is no biliary ductal dilation. Pancreas: Normal contours without ductal dilatation. No peripancreatic fluid collection. Spleen: No splenic laceration or hematoma. Adrenals/Urinary Tract: --Adrenal glands: No adrenal hemorrhage. --Right kidney/ureter: No hydronephrosis or perinephric hematoma. --Left kidney/ureter: No hydronephrosis or perinephric hematoma. --Urinary bladder: Unremarkable. Stomach/Bowel: --Stomach/Duodenum: No hiatal hernia or other gastric abnormality. Normal duodenal course and caliber. --Small bowel: No dilatation or inflammation. --Colon: No focal abnormality. --Appendix: Not visualized. No right lower quadrant inflammation or free fluid. Vascular/Lymphatic: Atherosclerotic calcification is present within the non-aneurysmal abdominal aorta, without hemodynamically significant stenosis. There is a questionable filling defect in the partially visualized proximal left superficial femoral vein. --No retroperitoneal lymphadenopathy. --No mesenteric lymphadenopathy. --No pelvic or inguinal lymphadenopathy. Reproductive: Unremarkable Other: No ascites or free air. The abdominal wall is normal. Musculoskeletal. There are degenerative changes of both hips. There is no acute displaced fracture involving the lumbar spine. Mild-to-moderate multilevel degenerative changes are noted of the lumbar spine. IMPRESSION: 1. No acute intra-abdominal abnormality detected. 2. No acute lumbar spine fracture. 3. Incidentally noted peripheral ground-glass airspace opacities at the lung bases. Findings are concerning for an atypical infectious process in the appropriate clinical setting. 4. Questionable filling defect in the partially visualized left superficial femoral vein. Follow-up with a lower extremity venous ultrasound is recommended to help exclude a lower extremity DVT. Aortic Atherosclerosis (ICD10-I70.0). Electronically  Signed   By: Katherine Mantle M.D.   On: 07/02/2019 21:08   CT L-SPINE NO CHARGE  Result Date: 07/02/2019 CLINICAL DATA:  Abdominal trauma. Weakness and cough. Pain status post fall. EXAM: CT ABDOMEN AND PELVIS WITH CONTRAST CT LUMBAR SPINE WITHOUT CONTRAST TECHNIQUE: Multidetector CT imaging of the abdomen and pelvis was performed using the standard protocol following bolus administration of intravenous contrast. Multiplanar CT images of the lumbar spine was reconstructed from contemporary CT of the Abdomen, and Pelvis CONTRAST:  OMNIPAQUE IOHEXOL 300 MG/ML  SOLN COMPARISON:  CT of the pelvis dated 09/16/2016. FINDINGS: Lower chest: There are peripheral ground-glass airspace opacities noted at the lung bases.Coronary artery calcifications are noted. The heart size appears relatively normal. Hepatobiliary: The liver is normal. Status post cholecystectomy.There is no biliary ductal dilation.  Pancreas: Normal contours without ductal dilatation. No peripancreatic fluid collection. Spleen: No splenic laceration or hematoma. Adrenals/Urinary Tract: --Adrenal glands: No adrenal hemorrhage. --Right kidney/ureter: No hydronephrosis or perinephric hematoma. --Left kidney/ureter: No hydronephrosis or perinephric hematoma. --Urinary bladder: Unremarkable. Stomach/Bowel: --Stomach/Duodenum: No hiatal hernia or other gastric abnormality. Normal duodenal course and caliber. --Small bowel: No dilatation or inflammation. --Colon: No focal abnormality. --Appendix: Not visualized. No right lower quadrant inflammation or free fluid. Vascular/Lymphatic: Atherosclerotic calcification is present within the non-aneurysmal abdominal aorta, without hemodynamically significant stenosis. There is a questionable filling defect in the partially visualized proximal left superficial femoral vein. --No retroperitoneal lymphadenopathy. --No mesenteric lymphadenopathy. --No pelvic or inguinal lymphadenopathy. Reproductive: Unremarkable  Other: No ascites or free air. The abdominal wall is normal. Musculoskeletal. There are degenerative changes of both hips. There is no acute displaced fracture involving the lumbar spine. Mild-to-moderate multilevel degenerative changes are noted of the lumbar spine. IMPRESSION: 1. No acute intra-abdominal abnormality detected. 2. No acute lumbar spine fracture. 3. Incidentally noted peripheral ground-glass airspace opacities at the lung bases. Findings are concerning for an atypical infectious process in the appropriate clinical setting. 4. Questionable filling defect in the partially visualized left superficial femoral vein. Follow-up with a lower extremity venous ultrasound is recommended to help exclude a lower extremity DVT. Aortic Atherosclerosis (ICD10-I70.0). Electronically Signed   By: Katherine Mantle M.D.   On: 07/02/2019 21:08    Procedures .Critical Care Performed by: Bill Salinas, PA-C Authorized by: Bill Salinas, PA-C   Critical care provider statement:    Critical care time (minutes):  40   Critical care was necessary to treat or prevent imminent or life-threatening deterioration of the following conditions: Acute hypoxic respiratory failure requiring supplemental oxygen due to COVID-19 viral infection.   Critical care was time spent personally by me on the following activities:  Discussions with consultants, evaluation of patient's response to treatment, examination of patient, ordering and performing treatments and interventions, ordering and review of laboratory studies, ordering and review of radiographic studies, pulse oximetry, re-evaluation of patient's condition, obtaining history from patient or surrogate, review of old charts and development of treatment plan with patient or surrogate   (including critical care time)  Medications Ordered in ED Medications  sodium chloride flush (NS) 0.9 % injection 3 mL (has no administration in time range)  remdesivir 200 mg  in sodium chloride 0.9% 250 mL IVPB (has no administration in time range)    Followed by  remdesivir 100 mg in sodium chloride 0.9 % 100 mL IVPB (has no administration in time range)  iohexol (OMNIPAQUE) 300 MG/ML solution 100 mL (100 mLs Intravenous Contrast Given 07/02/19 2006)    ED Course  I have reviewed the triage vital signs and the nursing notes.  Pertinent labs & imaging results that were available during my care of the patient were reviewed by me and considered in my medical decision making (see chart for details).  Clinical Course as of Jul 01 2128  Fri Jul 02, 2019  2122 Dr. Toniann Fail   [BM]    Clinical Course User Index [BM] Elizabeth Palau   MDM Rules/Calculators/A&P                     On arrival patient resting comfortably no acute distress, SPO2 90% on room air dropped down to 88-89% when talking.  Placed on supplemental oxygen.  He reports some mild back pain from his fall no other injuries today.  He does  have a mild nonproductive cough during examination which she reports is chronic.  No history of fevers. - High-sensitivity troponin negative Rapid Covid negative f lipase 31 D-dimer 0.63, age-adjusted normal LFTs nonacute BMP 90 CBC within normal limits Chest x-ray:  IMPRESSION:  Mild, streaky left basilar opacities are likely due to atelectasis.  No acute disease.   EKG: Normal sinus rhythm Normal ECG poor baseline, grossly unchanged Confirmed by Richardean Canal [phone]) on 07/02/2019 6:14:56 PM - As patient with hypoxia after a fall will obtain trauma scans.  Additionally concern for possible COVID-19 viral infection given pandemic, patient's cough and new hypoxia will obtain 2-hour test.  Patient will need admission. - CT head/Cspine:  IMPRESSION:  Atrophy with small vessel chronic ischemic changes of deep cerebral  white matter.    No acute intracranial abnormalities.    Multilevel degenerative disc and facet disease changes of the  cervical  spine.    No acute cervical spine abnormalities.   CT Angio Chest: IMPRESSION:  1. No pulmonary embolus.  2. Heterogeneous ground-glass opacities in the peripheral  predominant distribution, most prominent in the lower lobes.  Findings are suspicious for COVID pneumonia. Other inflammatory or  other atypical infectious etiologies are also considered.    Aortic Atherosclerosis (ICD10-I70.0).   CT AP/Lspine:  IMPRESSION:  1. No acute intra-abdominal abnormality detected.  2. No acute lumbar spine fracture.  3. Incidentally noted peripheral ground-glass airspace opacities at  the lung bases. Findings are concerning for an atypical infectious  process in the appropriate clinical setting.  4. Questionable filling defect in the partially visualized left  superficial femoral vein. Follow-up with a lower extremity venous  ultrasound is recommended to help exclude a lower extremity DVT.    Aortic Atherosclerosis (ICD10-I70.0).     COVID-19 positive. - Patient with acute hypoxic respiratory failure due to COVID-19 viral infection requiring supplemental oxygen.  On reassessment patient resting comfortably no acute distress.  Patient seen and evaluated by Dr. Silverio Lay during this visit.  Consult placed to hospitalist for admission. - 9:22 PM: Discussed case with hospitalist Dr. Toniann Fail who has accepted patient for admission.  Remainder of lab work pending, ultrasound of left lower extremity ordered based on CT.  William Hunter was evaluated in Emergency Department on 07/02/2019 for the symptoms described in the history of present illness. He was evaluated in the context of the global COVID-19 pandemic, which necessitated consideration that the patient might be at risk for infection with the SARS-CoV-2 virus that causes COVID-19. Institutional protocols and algorithms that pertain to the evaluation of patients at risk for COVID-19 are in a state of rapid change based on information released by  regulatory bodies including the CDC and federal and state organizations. These policies and algorithms were followed during the patient's care in the ED.   Note: Portions of this report may have been transcribed using voice recognition software. Every effort was made to ensure accuracy; however, inadvertent computerized transcription errors may still be present. Final Clinical Impression(s) / ED Diagnoses Final diagnoses:  Back pain  Acute hypoxemic respiratory failure due to COVID-19 [facility])  Fall, initial encounter    Rx / DC Orders ED Discharge Orders    None       Elizabeth Palau 07/02/19 2130    Charlynne Pander, MD 07/02/19 2248

## 2019-07-02 NOTE — ED Notes (Signed)
[phone] Hiram Comber, pts niece would like an update. Will update pts wife

## 2019-07-02 NOTE — ED Triage Notes (Signed)
Pt here from home with c/o weakness and cough , pt had fell earlier in the day and was helped up by ems and came back out to bring him to the Ed

## 2019-07-02 NOTE — ED Notes (Signed)
Laksh Hinners [phone]) called for updates, but will like update to be given to [facility](Niece#[phone]).

## 2019-07-03 ENCOUNTER — Encounter (HOSPITAL_COMMUNITY): Payer: Self-pay | Admitting: Internal Medicine

## 2019-07-03 ENCOUNTER — Other Ambulatory Visit: Payer: Self-pay

## 2019-07-03 ENCOUNTER — Inpatient Hospital Stay (HOSPITAL_COMMUNITY): Payer: Medicare HMO

## 2019-07-03 DIAGNOSIS — Z885 Allergy status to narcotic agent status: Secondary | ICD-10-CM | POA: Diagnosis not present

## 2019-07-03 DIAGNOSIS — J9600 Acute respiratory failure, unspecified whether with hypoxia or hypercapnia: Secondary | ICD-10-CM | POA: Diagnosis not present

## 2019-07-03 DIAGNOSIS — Z808 Family history of malignant neoplasm of other organs or systems: Secondary | ICD-10-CM | POA: Diagnosis not present

## 2019-07-03 DIAGNOSIS — E876 Hypokalemia: Secondary | ICD-10-CM | POA: Diagnosis not present

## 2019-07-03 DIAGNOSIS — M549 Dorsalgia, unspecified: Secondary | ICD-10-CM | POA: Diagnosis present

## 2019-07-03 DIAGNOSIS — U071 COVID-19: Secondary | ICD-10-CM

## 2019-07-03 DIAGNOSIS — I10 Essential (primary) hypertension: Secondary | ICD-10-CM | POA: Diagnosis present

## 2019-07-03 DIAGNOSIS — M069 Rheumatoid arthritis, unspecified: Secondary | ICD-10-CM | POA: Diagnosis present

## 2019-07-03 DIAGNOSIS — I824Z2 Acute embolism and thrombosis of unspecified deep veins of left distal lower extremity: Secondary | ICD-10-CM | POA: Diagnosis present

## 2019-07-03 DIAGNOSIS — M545 Low back pain: Secondary | ICD-10-CM | POA: Diagnosis not present

## 2019-07-03 DIAGNOSIS — E11649 Type 2 diabetes mellitus with hypoglycemia without coma: Secondary | ICD-10-CM | POA: Diagnosis not present

## 2019-07-03 DIAGNOSIS — Z8249 Family history of ischemic heart disease and other diseases of the circulatory system: Secondary | ICD-10-CM | POA: Diagnosis not present

## 2019-07-03 DIAGNOSIS — J9601 Acute respiratory failure with hypoxia: Secondary | ICD-10-CM | POA: Insufficient documentation

## 2019-07-03 DIAGNOSIS — Z8701 Personal history of pneumonia (recurrent): Secondary | ICD-10-CM | POA: Diagnosis not present

## 2019-07-03 DIAGNOSIS — Z9181 History of falling: Secondary | ICD-10-CM | POA: Diagnosis not present

## 2019-07-03 DIAGNOSIS — Z888 Allergy status to other drugs, medicaments and biological substances status: Secondary | ICD-10-CM | POA: Diagnosis not present

## 2019-07-03 DIAGNOSIS — Z880 Allergy status to penicillin: Secondary | ICD-10-CM | POA: Diagnosis not present

## 2019-07-03 DIAGNOSIS — J1282 Pneumonia due to coronavirus disease 2019: Secondary | ICD-10-CM | POA: Diagnosis present

## 2019-07-03 DIAGNOSIS — I252 Old myocardial infarction: Secondary | ICD-10-CM | POA: Diagnosis not present

## 2019-07-03 DIAGNOSIS — I2510 Atherosclerotic heart disease of native coronary artery without angina pectoris: Secondary | ICD-10-CM | POA: Diagnosis present

## 2019-07-03 DIAGNOSIS — Z79899 Other long term (current) drug therapy: Secondary | ICD-10-CM | POA: Diagnosis not present

## 2019-07-03 LAB — COMPREHENSIVE METABOLIC PANEL
ALT: 21 U/L (ref 0–44)
AST: 19 U/L (ref 15–41)
Albumin: 3.1 g/dL — ABNORMAL LOW (ref 3.5–5.0)
Alkaline Phosphatase: 84 U/L (ref 38–126)
Anion gap: 17 — ABNORMAL HIGH (ref 5–15)
BUN: 21 mg/dL (ref 8–23)
CO2: 18 mmol/L — ABNORMAL LOW (ref 22–32)
Calcium: 8.6 mg/dL — ABNORMAL LOW (ref 8.9–10.3)
Chloride: 100 mmol/L (ref 98–111)
Creatinine, Ser: 1.03 mg/dL (ref 0.61–1.24)
GFR calc Af Amer: >60 mL/min (ref >60)
GFR calc non Af Amer: >60 mL/min (ref >60)
Glucose, Bld: 193 mg/dL — ABNORMAL HIGH (ref 70–99)
Potassium: 3.8 mmol/L (ref 3.5–5.1)
Sodium: 135 mmol/L (ref 135–145)
Total Bilirubin: 1.2 mg/dL (ref 0.3–1.2)
Total Protein: 7.4 g/dL (ref 6.5–8.1)

## 2019-07-03 LAB — URINALYSIS, ROUTINE W REFLEX MICROSCOPIC
Bacteria, UA: NONE SEEN
Bilirubin Urine: NEGATIVE
Glucose, UA: >=500 mg/dL — AB
Hgb urine dipstick: NEGATIVE
Ketones, ur: 80 mg/dL — AB
Leukocytes,Ua: NEGATIVE
Nitrite: NEGATIVE
Protein, ur: NEGATIVE mg/dL
Specific Gravity, Urine: >1.046 — ABNORMAL HIGH (ref 1.005–1.030)
pH: 5 (ref 5.0–8.0)

## 2019-07-03 LAB — CBC WITH DIFFERENTIAL/PLATELET
Abs Immature Granulocytes: 0 10*3/uL (ref 0.00–0.07)
Basophils Absolute: 0 10*3/uL (ref 0.0–0.1)
Basophils Relative: 0 %
Eosinophils Absolute: 0 10*3/uL (ref 0.0–0.5)
Eosinophils Relative: 0 %
HCT: 45.5 % (ref 39.0–52.0)
Hemoglobin: 15.3 g/dL (ref 13.0–17.0)
Lymphocytes Relative: 9 %
Lymphs Abs: 0.8 10*3/uL (ref 0.7–4.0)
MCH: 30.8 pg (ref 26.0–34.0)
MCHC: 33.6 g/dL (ref 30.0–36.0)
MCV: 91.5 fL (ref 80.0–100.0)
Monocytes Absolute: 0.4 10*3/uL (ref 0.1–1.0)
Monocytes Relative: 5 %
Neutro Abs: 7.5 10*3/uL (ref 1.7–7.7)
Neutrophils Relative %: 86 %
Platelets: 236 10*3/uL (ref 150–400)
RBC: 4.97 MIL/uL (ref 4.22–5.81)
RDW: 12.9 % (ref 11.5–15.5)
WBC: 8.7 10*3/uL (ref 4.0–10.5)
nRBC: 0 % (ref 0.0–0.2)
nRBC: 0 /100 WBC

## 2019-07-03 LAB — GLUCOSE, CAPILLARY
Glucose-Capillary: 181 mg/dL — ABNORMAL HIGH (ref 70–99)
Glucose-Capillary: 165 mg/dL — ABNORMAL HIGH (ref 70–99)
Glucose-Capillary: 129 mg/dL — ABNORMAL HIGH (ref 70–99)
Glucose-Capillary: 93 mg/dL (ref 70–99)
Glucose-Capillary: 125 mg/dL — ABNORMAL HIGH (ref 70–99)

## 2019-07-03 LAB — TROPONIN I (HIGH SENSITIVITY): Troponin I (High Sensitivity): 4 ng/L (ref <18)

## 2019-07-03 LAB — FERRITIN: Ferritin: 259 ng/mL (ref 24–336)

## 2019-07-03 LAB — C-REACTIVE PROTEIN: CRP: 9.8 mg/dL — ABNORMAL HIGH (ref <1.0)

## 2019-07-03 LAB — CBG MONITORING, ED: Glucose-Capillary: 191 mg/dL — ABNORMAL HIGH (ref 70–99)

## 2019-07-03 MED ORDER — ONDANSETRON HCL 4 MG PO TABS: 4.0000 mg | ORAL_TABLET | Freq: Four times a day (QID) | ORAL | Status: DC | PRN

## 2019-07-03 MED ORDER — PNEUMOCOCCAL VAC POLYVALENT 25 MCG/0.5ML IJ INJ
0.5000 mL | INJECTION | INTRAMUSCULAR | Status: DC
  Filled 2019-07-03: qty 0.5

## 2019-07-03 MED ORDER — BENAZEPRIL HCL 10 MG PO TABS
10.0000 mg | ORAL_TABLET | Freq: Two times a day (BID) | ORAL | Status: DC
  Administered 2019-07-03 – 2019-07-05 (×6): 10 mg via ORAL
  Filled 2019-07-03: qty 2
  Filled 2019-07-03 (×8): qty 1

## 2019-07-03 MED ORDER — INSULIN ASPART 100 UNIT/ML ~~LOC~~ SOLN
0.0000 [IU] | Freq: Three times a day (TID) | SUBCUTANEOUS | Status: DC
  Administered 2019-07-03: 13:00:00 1 [IU] via SUBCUTANEOUS
  Administered 2019-07-03 – 2019-07-04 (×2): 2 [IU] via SUBCUTANEOUS
  Administered 2019-07-05: 1 [IU] via SUBCUTANEOUS
  Administered 2019-07-06: 18:00:00 2 [IU] via SUBCUTANEOUS
  Administered 2019-07-06 (×2): 1 [IU] via SUBCUTANEOUS

## 2019-07-03 MED ORDER — TRAZODONE HCL 50 MG PO TABS
50.0000 mg | ORAL_TABLET | Freq: Every evening | ORAL | Status: DC | PRN
  Administered 2019-07-03 – 2019-07-04 (×2): 50 mg via ORAL
  Filled 2019-07-03 (×2): qty 1

## 2019-07-03 MED ORDER — ENOXAPARIN SODIUM 100 MG/ML ~~LOC~~ SOLN
1.0000 mg/kg | Freq: Two times a day (BID) | SUBCUTANEOUS | Status: DC
  Administered 2019-07-03 – 2019-07-06 (×7): 100 mg via SUBCUTANEOUS
  Filled 2019-07-03 (×10): qty 1

## 2019-07-03 MED ORDER — ACETAMINOPHEN 325 MG PO TABS
650.0000 mg | ORAL_TABLET | Freq: Four times a day (QID) | ORAL | Status: DC | PRN
  Administered 2019-07-03 – 2019-07-04 (×2): 650 mg via ORAL
  Filled 2019-07-03 (×2): qty 2

## 2019-07-03 MED ORDER — INSULIN GLARGINE 100 UNIT/ML ~~LOC~~ SOLN
40.0000 [IU] | Freq: Every day | SUBCUTANEOUS | Status: DC
  Administered 2019-07-03 – 2019-07-04 (×3): 40 [IU] via SUBCUTANEOUS
  Filled 2019-07-03 (×5): qty 0.4

## 2019-07-03 MED ORDER — SULFASALAZINE 500 MG PO TABS
1500.0000 mg | ORAL_TABLET | Freq: Two times a day (BID) | ORAL | Status: DC
  Administered 2019-07-03 – 2019-07-06 (×7): 1500 mg via ORAL
  Filled 2019-07-03 (×12): qty 3

## 2019-07-03 MED ORDER — ENOXAPARIN SODIUM 40 MG/0.4ML ~~LOC~~ SOLN
40.0000 mg | SUBCUTANEOUS | Status: DC
  Administered 2019-07-03: 13:00:00 40 mg via SUBCUTANEOUS
  Filled 2019-07-03: qty 0.4

## 2019-07-03 MED ORDER — DILTIAZEM HCL ER COATED BEADS 300 MG PO CP24
300.0000 mg | ORAL_CAPSULE | Freq: Every day | ORAL | Status: DC
  Administered 2019-07-03 – 2019-07-06 (×4): 300 mg via ORAL
  Filled 2019-07-03 (×8): qty 1

## 2019-07-03 MED ORDER — ONDANSETRON HCL 4 MG/2ML IJ SOLN
4.0000 mg | Freq: Four times a day (QID) | INTRAMUSCULAR | Status: DC | PRN
  Administered 2019-07-04 – 2019-07-06 (×2): 4 mg via INTRAVENOUS
  Filled 2019-07-03 (×2): qty 2

## 2019-07-03 MED ORDER — INSULIN ASPART 100 UNIT/ML ~~LOC~~ SOLN
20.0000 [IU] | Freq: Three times a day (TID) | SUBCUTANEOUS | Status: DC
  Administered 2019-07-03 – 2019-07-05 (×7): 20 [IU] via SUBCUTANEOUS
  Administered 2019-07-05: 09:00:00 10 [IU] via SUBCUTANEOUS
  Administered 2019-07-05 – 2019-07-06 (×2): 20 [IU] via SUBCUTANEOUS
  Administered 2019-07-06: 13:00:00 10 [IU] via SUBCUTANEOUS
  Administered 2019-07-06: 18:00:00 20 [IU] via SUBCUTANEOUS

## 2019-07-03 MED ORDER — ACETAMINOPHEN 650 MG RE SUPP: 650.0000 mg | Freq: Four times a day (QID) | RECTAL | Status: DC | PRN

## 2019-07-03 MED ORDER — INFLUENZA VAC A&B SA ADJ QUAD 0.5 ML IM PRSY
0.5000 mL | PREFILLED_SYRINGE | INTRAMUSCULAR | Status: DC
  Filled 2019-07-03: qty 0.5

## 2019-07-03 NOTE — Progress Notes (Signed)
PROGRESS NOTE  Brief Narrative: William Hunter is a 74 y.o. male with a history of RA on humira and sulfasalazine, T2DM, and HTN who presented to the ED with weakness after a fall at home in the setting of about 2 weeks of fatigue, dyspnea, N/V/D. On arrival in the ED by EMS he was mildly hypoxic.   SARS-CoV-2 testing was positive, so remdesivir and steroids were provided and the patient was admitted to [facility] this morning. Due to a filling defect noted in the left femoral vein on CT abdomen/pelvis for which venous doppler was ordered and confirmed LLE DVT. Therapeutic anticoagulation is started  Subjective: Feels diffusely weak, fatigued, no dyspnea or chest pain.   Objective: BP 106/68 (BP Location: Right Arm)   Pulse 77   Temp 98.1 F (36.7 C) (Oral)   Resp 17   Ht 6' (1.829 m)   Wt 97.8 kg   SpO2 94%   BMI 29.24 kg/m   Gen: Nontoxic Pulm: Bibasilar crackles, nonlabored on room air  CV: RRR, no murmur, no JVD, no edema GI: Soft, NT, ND, +BS  Neuro: Alert and oriented. No focal deficits. Negative homan's Skin: No rashes, lesions or ulcers  Assessment & Plan: Principal Problem:   Acute respiratory failure due to COVID-19 [facility]) Active Problems:   Coronary artery disease   Hypertension   Rheumatoid arthritis ([name]  Acute hypoxemic respiratory failure due to covid-19 pneumonia: SARS-CoV-2 PCR positive on 1/22. CRP 8.5, PCT negative. GGOs noted with lower lobe bibasilar peripheral predominant distribution on CTA chest, no PE. - Has significant allergy to steroids in the past, will forego this. Tocilizumab considered, though it is felt to be contraindicated by clinical stability and use of immunomodulators. - Continue remdesivir x5 days 1/22 - 1/26 - Vitamin C, zinc - Encourage OOB, IS, FV, and awake proning if able - Tylenol and antitussives prn - Continue airborne, contact precautions while admitted. Isolation period would be recommended for 21 days from positive testing. - Check  CBC w/diff, CMP, CRP daily - Enoxaparin prophylactic dose.  - Maintain euvolemia/net negative.  - Avoid NSAIDs   Acute DVT LLE: Based on preliminary interpretation. D-dimer 0.63, no PE on CTA chest, and is asymptomatic from DVT perspective.  - Start therapeutic dose lovenox.  Fall at home:  - CT head, c-spine, L-spine without fracture/dislocation  HTN:  - Continue benazepril, diltiazem  IDT2DM:  - Continue basal-bolus insulin  RA:  - Took humira 07/01/2019, continue mesalamine  Tyrone Nine, MD Pager on amion 07/03/2019, 6:08 PM

## 2019-07-03 NOTE — Progress Notes (Signed)
Left lower extremity venous duplex has been completed. Preliminary results can be found in CV Proc through chart review.  Results were given to [facility] RN.  07/03/19 11:17 AM Olen Cordial RVT

## 2019-07-03 NOTE — ED Notes (Signed)
Family updated as to patient's status. Neice

## 2019-07-03 NOTE — ED Notes (Signed)
+  tele  [facility] called for pt

## 2019-07-03 NOTE — ED Notes (Signed)
[facility] arrived  

## 2019-07-03 NOTE — H&P (Addendum)
History and Physical    [name]:[id number] DOB: April 21, 1946 DOA: 07/02/2019  PCP: Irena Reichmann, DO  Patient coming from: Home.  Chief Complaint: Fall and weakness.  HPI: William Hunter is a 74 y.o. male with history of rheumatoid arthritis, diabetes mellitus type 2, hypertension has been having cough and shortness of breath over the last 2 weeks.  Also has been nausea vomiting diarrhea fatigue and weakness.  Patient finds it weak to ambulate and had a fall yesterday falling backwards hitting his head did not lose consciousness.  EMS was called and patient was brought to the ER.  Patient denies any chest pain.  Has been having pain in the low back.  ED Course: In the ER patient appeared weak initially was mildly hypoxic.  CT scan of the head C-spine CT angiogram of the chest CT abdomen pelvis were done.  CT L-spine also was done.  No trauma was noted.  CT angiogram of the chest shows bilateral infiltrates concerning for atypical pneumonia.  CT abdomen pelvis was largely unremarkable but did show a filling defect in the left femoral vein for which Dopplers of the lower extremity has been ordered.  Covid test came back positive.  Patient was started on remdesivir.  Patient admitted for further work-up of acute respiratory failure with COVID-19 infection.  Labs show blood sugar 177 LDH 201 CRP 8.5 procalcitonin less than 0.1 high-sensitivity troponin was unremarkable UA shows blood sugar more than 500 with ketones.  Review of Systems: As per HPI, rest all negative.   Past Medical History:  Diagnosis Date  . Anginal pain ([name]   . Arthritis    "fingers" (03/09/2012)  . Cold feet    "from my diabetes; they stay cold" (03/09/2012)  . Coronary artery disease   . Coronary artery spasm [facility])    since age 64 Dr Aleen Campi  . Dyslipidemia   . Headache(784.0)   . Hx of gallstones    Dr Derrell Lolling lap cholecystectomy  . Hx of plastic surgery    plastic surgery following a fall off a loading dock  ,lost  all his upper teeth   . Hypertension   . MI, old    at age 23  . Migraines   . Pneumonia ~ 1962; 1970's   "double"; "regular" (03/09/2012)  . Rheumatoid arthritis ([name]    Dr Dierdre Forth   . Slipped cervical disc 2008   Dr Patsi Sears in the past/ Dr Larita Fife , chiropractor in 2008  . Thyroid disease    Dr Lucianne Muss  . Type II diabetes mellitus ([name]     Past Surgical History:  Procedure Laterality Date  . CARDIAC CATHETERIZATION     x 3 with NO stents   . CHOLECYSTECTOMY  03/11/2012   Procedure: LAPAROSCOPIC CHOLECYSTECTOMY WITH INTRAOPERATIVE CHOLANGIOGRAM;  Surgeon: Ernestene Mention, MD;  Location: [facility] OR;  Service: General;  Laterality: N/A;  . PENILE PROSTHESIS IMPLANT  1983     reports that he has never smoked. He has never used smokeless tobacco. He reports that he does not drink alcohol or use drugs.  Allergies  Allergen Reactions  . Codeine Swelling  . Penicillins Hives and Swelling    Has patient had a PCN reaction causing immediate rash, facial/tongue/throat swelling, SOB or lightheadedness with hypotension: Yes Has patient had a PCN reaction causing severe rash involving mucus membranes or skin necrosis: No Has patient had a PCN reaction that required hospitalization No Has patient had a PCN reaction occurring within the  last 10 years: No If all of the above answers are "NO", then may proceed with Cephalosporin use.   . Prednisone Hives  . Wool Alcohol [Lanolin] Hives    Family History  Problem Relation Age of Onset  . Heart failure Mother   . Cancer - Other Mother        breast  . Heart disease Father   . Cancer - Other Sister        tongue cancer  . Heart attack Sister     Prior to Admission medications   Medication Sig Start Date End Date Taking? Authorizing Provider  Adalimumab (HUMIRA) 40 MG/0.4ML PSKT Inject 40 mg into the skin every 14 (fourteen) days. Every other Thursday   Yes [provider]  benazepril (LOTENSIN) 10 MG tablet Take 10 mg  by mouth 2 (two) times daily.  05/11/15  Yes [provider]  diltiazem (TIAZAC) 300 MG 24 hr capsule Take 1 capsule (300 mg total) by mouth daily. 06/25/19  Yes Jake Bathe, MD  empagliflozin (JARDIANCE) 10 MG TABS tablet Take 10 mg by mouth daily.   Yes [provider]  ibuprofen (ADVIL) 200 MG tablet Take 400 mg by mouth daily.   Yes [provider]  insulin aspart (NOVOLOG) 100 UNIT/ML injection Inject 20 Units into the skin 3 (three) times daily before meals.   Yes [provider]  Insulin Glargine (LANTUS SOLOSTAR) 100 UNIT/ML Solostar Pen Inject 40 Units into the skin at bedtime.   Yes [provider]  loratadine (CLARITIN) 10 MG tablet Take 10 mg by mouth daily.   Yes [provider]  metFORMIN (GLUCOPHAGE-XR) 500 MG 24 hr tablet Take 1,000 mg by mouth 2 (two) times daily. 04/08/19  Yes [provider]  OVER THE COUNTER MEDICATION Apply 1 application topically daily. Hemp cream - for pain   Yes [provider]  sulfaSALAzine (AZULFIDINE) 500 MG tablet Take 1,500 mg by mouth 2 (two) times daily. 02/23/19  Yes [provider]    Physical Exam: Constitutional: Moderately built and nourished. Vitals:   07/03/19 0000 07/03/19 0030 07/03/19 0100 07/03/19 0130  BP: 131/77 138/77 121/64 130/74  Pulse: 76 86 83 85  Resp: (!) 32 17 (!) 23 (!) 24  Temp:      TempSrc:      SpO2: 92% 94% 93% 94%   Eyes: Anicteric no pallor. ENMT: No discharge from the ears eyes nose or mouth. Neck: No mass felt.  No neck rigidity.  No JVD appreciated. Respiratory: No rhonchi or crepitations. Cardiovascular: S1-S2 heard. Abdomen: Soft nontender bowel sounds present. Musculoskeletal: No edema.  No joint effusion. Skin: No rash. Neurologic: Alert awake oriented to time place and person.  Moves all extremities. Psychiatric: Appears normal per normal affect.   Labs on Admission: I have personally reviewed following labs and  imaging studies  CBC: Recent Labs  Lab 07/02/19 1450  WBC 7.0  HGB 15.7  HCT 46.1  MCV 89.5  PLT 244   Basic Metabolic Panel: Recent Labs  Lab 07/02/19 1557 07/02/19 2037  NA 135 133*  K 4.1 3.8  CL 99 99  CO2 21* 18*  GLUCOSE 207* 177*  BUN 20 19  CREATININE 0.94 0.97  CALCIUM 9.2 8.4*   GFR: CrCl cannot be calculated (Unknown ideal weight.). Liver Function Tests: Recent Labs  Lab 07/02/19 1748 07/02/19 2037  AST 21 20  ALT 22 23  ALKPHOS 76 71  BILITOT 1.1 1.2  PROT 8.1 7.5  ALBUMIN 3.3* 3.1*   Recent Labs  Lab 07/02/19 1748  LIPASE 31   No results for input(s): AMMONIA in the last 168 hours. Coagulation Profile: No results for input(s): INR, PROTIME in the last 168 hours. Cardiac Enzymes: No results for input(s): CKTOTAL, CKMB, CKMBINDEX, TROPONINI in the last 168 hours. BNP (last 3 results) No results for input(s): PROBNP in the last 8760 hours. HbA1C: No results for input(s): HGBA1C in the last 72 hours. CBG: No results for input(s): GLUCAP in the last 168 hours. Lipid Profile: Recent Labs    07/02/19 2037  TRIG 106   Thyroid Function Tests: No results for input(s): TSH, T4TOTAL, FREET4, T3FREE, THYROIDAB in the last 72 hours. Anemia Panel: Recent Labs    07/02/19 2037  FERRITIN 219   Urine analysis:    Component Value Date/Time   COLORURINE YELLOW 07/03/2019 0154   APPEARANCEUR CLEAR 07/03/2019 0154   LABSPEC >1.046 (H) 07/03/2019 0154   PHURINE 5.0 07/03/2019 0154   GLUCOSEU >=500 (A) 07/03/2019 0154   HGBUR NEGATIVE 07/03/2019 0154   BILIRUBINUR NEGATIVE 07/03/2019 0154   KETONESUR 80 (A) 07/03/2019 0154   PROTEINUR NEGATIVE 07/03/2019 0154   NITRITE NEGATIVE 07/03/2019 0154   LEUKOCYTESUR NEGATIVE 07/03/2019 0154   Sepsis Labs: @LABRCNTIP (procalcitonin:4,lacticidven:4) ) Recent Results (from the past 240 hour(s))  Respiratory Panel by RT PCR (Flu A&B, Covid) - Nasopharyngeal Swab     Status: Abnormal   Collection Time:  07/02/19  6:39 PM   Specimen: Nasopharyngeal Swab  Result Value Ref Range Status   SARS Coronavirus 2 by RT PCR POSITIVE (A) NEGATIVE Final    Comment: RESULT CALLED TO, READ BACK BY AND VERIFIED WITH: D YAO MD 07/02/19 2031 JDW (NOTE) SARS-CoV-2 target nucleic acids are DETECTED. SARS-CoV-2 RNA is generally detectable in upper respiratory specimens  during the acute phase of infection. Positive results are indicative of the presence of the identified virus, but do not rule out bacterial infection or co-infection with other pathogens not detected by the test. Clinical correlation with patient history and other diagnostic information is necessary to determine patient infection status. The expected result is Negative. Fact Sheet for Patients:  PinkCheek.be Fact Sheet for Healthcare Providers: GravelBags.it This test is not yet approved or cleared by the [place] FDA and  has been authorized for detection and/or diagnosis of SARS-CoV-2 by FDA under an Emergency Use Authorization (EUA).  This EUA will remain in effect (meaning this test can be used) for the dur ation of  the COVID-19 declaration under Section 564(b)(1) of the Act, 21 U.S.C. section 360bbb-3(b)(1), unless the authorization is terminated or revoked sooner.    Influenza A by PCR NEGATIVE NEGATIVE Final   Influenza B by PCR NEGATIVE NEGATIVE Final    Comment: (NOTE) The Xpert Xpress SARS-CoV-2/FLU/RSV assay is intended as an aid in  the diagnosis of influenza from Nasopharyngeal swab specimens and  should not be used as a sole basis for treatment. Nasal washings and  aspirates are unacceptable for Xpert Xpress SARS-CoV-2/FLU/RSV  testing. Fact Sheet for Patients: PinkCheek.be Fact Sheet for Healthcare Providers: GravelBags.it This test is not yet approved or cleared by the [place] FDA and  has  been authorized for detection and/or diagnosis of SARS-CoV-2 by  FDA under an Emergency Use Authorization (EUA). This EUA will remain  in effect (meaning this test can be used) for the duration of the  Covid-19 declaration under Section 564(b)(1) of the Act, 21  U.S.C. section 360bbb-3(b)(1), unless the authorization is  terminated or revoked. Performed at [facility], [address], [place], [place] [address]      Radiological Exams on Admission: DG Chest 2 View  Result Date: 07/02/2019 CLINICAL DATA:  Weakness for 2 weeks. The patient suffered a fall today. EXAM: CHEST - 2 VIEW COMPARISON:  PA and lateral chest 09/16/2016. FINDINGS: Lung volumes are lower than on the comparison examination. Mild, streaky opacities are seen in the left lung base. No pneumothorax or pleural effusion. Heart size is normal. No acute or focal bony abnormality. IMPRESSION: Mild, streaky left basilar opacities are likely due to atelectasis. No acute disease. Electronically Signed   By: Drusilla Kanner M.D.   On: 07/02/2019 15:05   CT Head Wo Contrast  Result Date: 07/02/2019 CLINICAL DATA:  Head trauma, fell earlier today, weakness, cough, history coronary artery disease post MI, hypertension, type II diabetes mellitus EXAM: CT HEAD WITHOUT CONTRAST CT CERVICAL SPINE WITHOUT CONTRAST TECHNIQUE: Multidetector CT imaging of the head and cervical spine was performed following the standard protocol without intravenous contrast. Multiplanar CT image reconstructions of the cervical spine were also generated. COMPARISON:  CT head 09/16/2016 FINDINGS: CT HEAD FINDINGS Brain: Generalized atrophy with ex vacuo dilatation of the ventricular system. Prominent cisterna magna. No midline shift or mass effect. Small vessel chronic ischemic changes of deep cerebral white matter. No intracranial hemorrhage, mass lesion, or evidence of acute infarction. No extra-axial fluid collections. Vascular: No hyperdense vessels.  Atherosclerotic calcifications of internal carotid arteries and LEFT vertebral artery at skull base Skull: Intact Sinuses/Orbits: Clear Other: N/A CT CERVICAL SPINE FINDINGS Alignment: Minimal anterolisthesis at C4-C5. Remaining alignments normal Skull base and vertebrae: Osseous demineralization. Significant degenerative changes at articulation of odontoid process with anterior arch of C1. Skull base intact. Multilevel disc space narrowing and endplate spur formation. Multilevel facet degenerative changes. Encroachment upon LEFT C4-C5 foramen by uncovertebral and facet hypertrophy. No fracture, additional subluxation or bone destruction. Soft tissues and spinal canal: Prevertebral soft tissues normal thickness. Disc levels:  No additional abnormalities Upper chest: Lung apices clear Other: N/A IMPRESSION: Atrophy with small vessel chronic ischemic changes of deep cerebral white matter. No acute intracranial abnormalities. Multilevel degenerative disc and facet disease changes of the cervical spine. No acute cervical spine abnormalities. Electronically Signed   By: Ulyses Southward M.D.   On: 07/02/2019 21:03   CT Angio Chest PE W and/or Wo Contrast  Result Date: 07/02/2019 CLINICAL DATA:  Shortness of breath. Cough and weakness. Fall earlier today. EXAM: CT ANGIOGRAPHY CHEST WITH CONTRAST TECHNIQUE: Multidetector CT imaging of the chest was performed using the standard protocol during bolus administration of intravenous contrast. Multiplanar CT image reconstructions and MIPs were obtained to evaluate the vascular anatomy. CONTRAST:  OMNIPAQUE IOHEXOL 300 MG/ML  SOLN COMPARISON:  Radiograph earlier this day FINDINGS: Cardiovascular: There are no filling defects within the pulmonary arteries to suggest pulmonary embolus. Mild aortic atherosclerosis and tortuosity. No aortic dissection. Heart is normal in size. No pericardial effusion. Mediastinum/Nodes: No enlarged mediastinal or hilar lymph nodes. No thyroid  nodule. No esophageal wall thickening. Tiny hiatal hernia. Lungs/Pleura: Heterogeneous ground-glass opacities in the peripheral predominant distribution. Findings most prominent in the lower lobes, however there also patchy opacities in the upper lobes and right middle lobe. No septal thickening or findings of pulmonary edema. No pleural fluid. Trachea and mainstem bronchi are patent. Upper Abdomen: Assessed on concurrent abdominal CT, reported separately. Cholecystectomy. Musculoskeletal: There are no acute or suspicious osseous abnormalities. Degenerative change in the  thoracic spine. No acute fracture. No chest wall contusion. Review of the MIP images confirms the above findings. IMPRESSION: 1. No pulmonary embolus. 2. Heterogeneous ground-glass opacities in the peripheral predominant distribution, most prominent in the lower lobes. Findings are suspicious for COVID pneumonia. Other inflammatory or other atypical infectious etiologies are also considered. Aortic Atherosclerosis (ICD10-I70.0). Electronically Signed   By: Narda Rutherford M.D.   On: 07/02/2019 21:08   CT Cervical Spine Wo Contrast  Result Date: 07/02/2019 CLINICAL DATA:  Head trauma, fell earlier today, weakness, cough, history coronary artery disease post MI, hypertension, type II diabetes mellitus EXAM: CT HEAD WITHOUT CONTRAST CT CERVICAL SPINE WITHOUT CONTRAST TECHNIQUE: Multidetector CT imaging of the head and cervical spine was performed following the standard protocol without intravenous contrast. Multiplanar CT image reconstructions of the cervical spine were also generated. COMPARISON:  CT head 09/16/2016 FINDINGS: CT HEAD FINDINGS Brain: Generalized atrophy with ex vacuo dilatation of the ventricular system. Prominent cisterna magna. No midline shift or mass effect. Small vessel chronic ischemic changes of deep cerebral white matter. No intracranial hemorrhage, mass lesion, or evidence of acute infarction. No extra-axial fluid  collections. Vascular: No hyperdense vessels. Atherosclerotic calcifications of internal carotid arteries and LEFT vertebral artery at skull base Skull: Intact Sinuses/Orbits: Clear Other: N/A CT CERVICAL SPINE FINDINGS Alignment: Minimal anterolisthesis at C4-C5. Remaining alignments normal Skull base and vertebrae: Osseous demineralization. Significant degenerative changes at articulation of odontoid process with anterior arch of C1. Skull base intact. Multilevel disc space narrowing and endplate spur formation. Multilevel facet degenerative changes. Encroachment upon LEFT C4-C5 foramen by uncovertebral and facet hypertrophy. No fracture, additional subluxation or bone destruction. Soft tissues and spinal canal: Prevertebral soft tissues normal thickness. Disc levels:  No additional abnormalities Upper chest: Lung apices clear Other: N/A IMPRESSION: Atrophy with small vessel chronic ischemic changes of deep cerebral white matter. No acute intracranial abnormalities. Multilevel degenerative disc and facet disease changes of the cervical spine. No acute cervical spine abnormalities. Electronically Signed   By: Ulyses Southward M.D.   On: 07/02/2019 21:03   CT ABDOMEN PELVIS W CONTRAST  Result Date: 07/02/2019 CLINICAL DATA:  Abdominal trauma. Weakness and cough. Pain status post fall. EXAM: CT ABDOMEN AND PELVIS WITH CONTRAST CT LUMBAR SPINE WITHOUT CONTRAST TECHNIQUE: Multidetector CT imaging of the abdomen and pelvis was performed using the standard protocol following bolus administration of intravenous contrast. Multiplanar CT images of the lumbar spine was reconstructed from contemporary CT of the Abdomen, and Pelvis CONTRAST:  OMNIPAQUE IOHEXOL 300 MG/ML  SOLN COMPARISON:  CT of the pelvis dated 09/16/2016. FINDINGS: Lower chest: There are peripheral ground-glass airspace opacities noted at the lung bases.Coronary artery calcifications are noted. The heart size appears relatively normal. Hepatobiliary: The  liver is normal. Status post cholecystectomy.There is no biliary ductal dilation. Pancreas: Normal contours without ductal dilatation. No peripancreatic fluid collection. Spleen: No splenic laceration or hematoma. Adrenals/Urinary Tract: --Adrenal glands: No adrenal hemorrhage. --Right kidney/ureter: No hydronephrosis or perinephric hematoma. --Left kidney/ureter: No hydronephrosis or perinephric hematoma. --Urinary bladder: Unremarkable. Stomach/Bowel: --Stomach/Duodenum: No hiatal hernia or other gastric abnormality. Normal duodenal course and caliber. --Small bowel: No dilatation or inflammation. --Colon: No focal abnormality. --Appendix: Not visualized. No right lower quadrant inflammation or free fluid. Vascular/Lymphatic: Atherosclerotic calcification is present within the non-aneurysmal abdominal aorta, without hemodynamically significant stenosis. There is a questionable filling defect in the partially visualized proximal left superficial femoral vein. --No retroperitoneal lymphadenopathy. --No mesenteric lymphadenopathy. --No pelvic or inguinal lymphadenopathy. Reproductive: Unremarkable Other: No  ascites or free air. The abdominal wall is normal. Musculoskeletal. There are degenerative changes of both hips. There is no acute displaced fracture involving the lumbar spine. Mild-to-moderate multilevel degenerative changes are noted of the lumbar spine. IMPRESSION: 1. No acute intra-abdominal abnormality detected. 2. No acute lumbar spine fracture. 3. Incidentally noted peripheral ground-glass airspace opacities at the lung bases. Findings are concerning for an atypical infectious process in the appropriate clinical setting. 4. Questionable filling defect in the partially visualized left superficial femoral vein. Follow-up with a lower extremity venous ultrasound is recommended to help exclude a lower extremity DVT. Aortic Atherosclerosis (ICD10-I70.0). Electronically Signed   By: Katherine Mantle M.D.   On:  07/02/2019 21:08   CT L-SPINE NO CHARGE  Result Date: 07/02/2019 CLINICAL DATA:  Abdominal trauma. Weakness and cough. Pain status post fall. EXAM: CT ABDOMEN AND PELVIS WITH CONTRAST CT LUMBAR SPINE WITHOUT CONTRAST TECHNIQUE: Multidetector CT imaging of the abdomen and pelvis was performed using the standard protocol following bolus administration of intravenous contrast. Multiplanar CT images of the lumbar spine was reconstructed from contemporary CT of the Abdomen, and Pelvis CONTRAST:  OMNIPAQUE IOHEXOL 300 MG/ML  SOLN COMPARISON:  CT of the pelvis dated 09/16/2016. FINDINGS: Lower chest: There are peripheral ground-glass airspace opacities noted at the lung bases.Coronary artery calcifications are noted. The heart size appears relatively normal. Hepatobiliary: The liver is normal. Status post cholecystectomy.There is no biliary ductal dilation. Pancreas: Normal contours without ductal dilatation. No peripancreatic fluid collection. Spleen: No splenic laceration or hematoma. Adrenals/Urinary Tract: --Adrenal glands: No adrenal hemorrhage. --Right kidney/ureter: No hydronephrosis or perinephric hematoma. --Left kidney/ureter: No hydronephrosis or perinephric hematoma. --Urinary bladder: Unremarkable. Stomach/Bowel: --Stomach/Duodenum: No hiatal hernia or other gastric abnormality. Normal duodenal course and caliber. --Small bowel: No dilatation or inflammation. --Colon: No focal abnormality. --Appendix: Not visualized. No right lower quadrant inflammation or free fluid. Vascular/Lymphatic: Atherosclerotic calcification is present within the non-aneurysmal abdominal aorta, without hemodynamically significant stenosis. There is a questionable filling defect in the partially visualized proximal left superficial femoral vein. --No retroperitoneal lymphadenopathy. --No mesenteric lymphadenopathy. --No pelvic or inguinal lymphadenopathy. Reproductive: Unremarkable Other: No ascites or free air. The abdominal  wall is normal. Musculoskeletal. There are degenerative changes of both hips. There is no acute displaced fracture involving the lumbar spine. Mild-to-moderate multilevel degenerative changes are noted of the lumbar spine. IMPRESSION: 1. No acute intra-abdominal abnormality detected. 2. No acute lumbar spine fracture. 3. Incidentally noted peripheral ground-glass airspace opacities at the lung bases. Findings are concerning for an atypical infectious process in the appropriate clinical setting. 4. Questionable filling defect in the partially visualized left superficial femoral vein. Follow-up with a lower extremity venous ultrasound is recommended to help exclude a lower extremity DVT. Aortic Atherosclerosis (ICD10-I70.0). Electronically Signed   By: Katherine Mantle M.D.   On: 07/02/2019 21:08    EKG: Independently reviewed.  Normal sinus rhythm.  Assessment/Plan Principal Problem:   Acute respiratory failure due to COVID-19 [facility]) Active Problems:   Coronary artery disease   Hypertension   Rheumatoid arthritis ([name]    1. Acute respiratory failure with hypoxia secondary to COVID-19 infection with patient having other symptoms including nausea vomiting and diarrhea.  At this time patient is only requiring 2 L oxygen.  We will keep patient IV remdesivir.  Patient has allergies to Steroids.  Patient is immunosuppressed state with rheumatoid arthritis on Humira and mesalamine so was not started on Actemra. 2. Hypertension on diltiazem benazepril. 3. Diabetes mellitus type 2 on Lantus  insulin and Premeal 20 units with sliding scale coverage. 4. Rheumatoid arthritis takes Humira every other week and mesalamine.  Given the generalized fatigue with the respiratory failure with Covid infection will need inpatient status.  CT abdomen pelvis did show a filling defect in the left femoral vein for which Dopplers have been ordered.  Rule out DVT.   DVT prophylaxis: Lovenox. Code Status: Full  code. Family Communication: Discussed with patient. Disposition Plan: Home. Consults called: None. Admission status: Inpatient.   Eduard Clos MD [facility] Pager [phone].  If 7PM-7AM, please contact night-coverage [email or web address] Password [email or web address]  07/03/2019, 3:10 AM

## 2019-07-03 NOTE — Plan of Care (Signed)
  Problem: Education: Goal: Knowledge of risk factors and measures for prevention of condition will improve Outcome: Progressing   Problem: Coping: Goal: Psychosocial and spiritual needs will be supported Outcome: Progressing   Problem: Respiratory: Goal: Will maintain a patent airway Outcome: Progressing Goal: Complications related to the disease process, condition or treatment will be avoided or minimized Outcome: Progressing   Problem: Education: Goal: Ability to describe self-care measures that may prevent or decrease complications (Diabetes Survival Skills Education) will improve Outcome: Progressing Goal: Individualized Educational Video(s) Outcome: Progressing   Problem: Education: Goal: Individualized Educational Video(s) Outcome: Progressing   Problem: Coping: Goal: Ability to adjust to condition or change in health will improve Outcome: Progressing   Problem: Fluid Volume: Goal: Ability to maintain a balanced intake and output will improve Outcome: Progressing   Problem: Health Behavior/Discharge Planning: Goal: Ability to identify and utilize available resources and services will improve Outcome: Progressing Goal: Ability to manage health-related needs will improve Outcome: Progressing   Problem: Metabolic: Goal: Ability to maintain appropriate glucose levels will improve Outcome: Progressing   Problem: Nutritional: Goal: Maintenance of adequate nutrition will improve Outcome: Progressing Goal: Progress toward achieving an optimal weight will improve Outcome: Progressing   Problem: Skin Integrity: Goal: Risk for impaired skin integrity will decrease Outcome: Progressing   Problem: Tissue Perfusion: Goal: Adequacy of tissue perfusion will improve Outcome: Progressing

## 2019-07-04 DIAGNOSIS — I2510 Atherosclerotic heart disease of native coronary artery without angina pectoris: Secondary | ICD-10-CM

## 2019-07-04 DIAGNOSIS — I2583 Coronary atherosclerosis due to lipid rich plaque: Secondary | ICD-10-CM

## 2019-07-04 DIAGNOSIS — I10 Essential (primary) hypertension: Secondary | ICD-10-CM

## 2019-07-04 LAB — CBC WITH DIFFERENTIAL/PLATELET
Abs Immature Granulocytes: 0.02 10*3/uL (ref 0.00–0.07)
Basophils Absolute: 0 10*3/uL (ref 0.0–0.1)
Basophils Relative: 0 %
Eosinophils Absolute: 0.1 10*3/uL (ref 0.0–0.5)
Eosinophils Relative: 2 %
HCT: 41 % (ref 39.0–52.0)
Hemoglobin: 13.3 g/dL (ref 13.0–17.0)
Immature Granulocytes: 0 %
Lymphocytes Relative: 25 %
Lymphs Abs: 1.6 10*3/uL (ref 0.7–4.0)
MCH: 29.4 pg (ref 26.0–34.0)
MCHC: 32.4 g/dL (ref 30.0–36.0)
MCV: 90.7 fL (ref 80.0–100.0)
Monocytes Absolute: 0.7 10*3/uL (ref 0.1–1.0)
Monocytes Relative: 11 %
Neutro Abs: 3.9 10*3/uL (ref 1.7–7.7)
Neutrophils Relative %: 62 %
Platelets: 243 10*3/uL (ref 150–400)
RBC: 4.52 MIL/uL (ref 4.22–5.81)
RDW: 12.8 % (ref 11.5–15.5)
WBC: 6.3 10*3/uL (ref 4.0–10.5)
nRBC: 0 % (ref 0.0–0.2)

## 2019-07-04 LAB — COMPREHENSIVE METABOLIC PANEL
ALT: 18 U/L (ref 0–44)
AST: 18 U/L (ref 15–41)
Albumin: 2.9 g/dL — ABNORMAL LOW (ref 3.5–5.0)
Alkaline Phosphatase: 68 U/L (ref 38–126)
Anion gap: 10 (ref 5–15)
BUN: 23 mg/dL (ref 8–23)
CO2: 26 mmol/L (ref 22–32)
Calcium: 8.6 mg/dL — ABNORMAL LOW (ref 8.9–10.3)
Chloride: 100 mmol/L (ref 98–111)
Creatinine, Ser: 0.79 mg/dL (ref 0.61–1.24)
GFR calc Af Amer: >60 mL/min (ref >60)
GFR calc non Af Amer: >60 mL/min (ref >60)
Glucose, Bld: 94 mg/dL (ref 70–99)
Potassium: 3.3 mmol/L — ABNORMAL LOW (ref 3.5–5.1)
Sodium: 136 mmol/L (ref 135–145)
Total Bilirubin: 0.7 mg/dL (ref 0.3–1.2)
Total Protein: 6.6 g/dL (ref 6.5–8.1)

## 2019-07-04 LAB — D-DIMER, QUANTITATIVE: D-Dimer, Quant: 0.61 ug/mL-FEU — ABNORMAL HIGH (ref 0.00–0.50)

## 2019-07-04 LAB — GLUCOSE, CAPILLARY
Glucose-Capillary: 120 mg/dL — ABNORMAL HIGH (ref 70–99)
Glucose-Capillary: 160 mg/dL — ABNORMAL HIGH (ref 70–99)
Glucose-Capillary: 88 mg/dL (ref 70–99)
Glucose-Capillary: 114 mg/dL — ABNORMAL HIGH (ref 70–99)

## 2019-07-04 LAB — C-REACTIVE PROTEIN: CRP: 7.3 mg/dL — ABNORMAL HIGH (ref <1.0)

## 2019-07-04 MED ORDER — POTASSIUM CHLORIDE CRYS ER 20 MEQ PO TBCR
40.0000 meq | EXTENDED_RELEASE_TABLET | Freq: Once | ORAL | Status: AC
  Administered 2019-07-04: 14:00:00 40 meq via ORAL
  Filled 2019-07-04: qty 2

## 2019-07-04 NOTE — Plan of Care (Signed)
  Problem: Education: Goal: Knowledge of risk factors and measures for prevention of condition will improve Outcome: Progressing   Problem: Coping: Goal: Psychosocial and spiritual needs will be supported Outcome: Progressing   Problem: Respiratory: Goal: Will maintain a patent airway Outcome: Progressing Goal: Complications related to the disease process, condition or treatment will be avoided or minimized Outcome: Progressing   Problem: Education: Goal: Ability to describe self-care measures that may prevent or decrease complications (Diabetes Survival Skills Education) will improve Outcome: Progressing Goal: Individualized Educational Video(s) Outcome: Progressing   Problem: Coping: Goal: Ability to adjust to condition or change in health will improve Outcome: Progressing   Problem: Fluid Volume: Goal: Ability to maintain a balanced intake and output will improve Outcome: Progressing   Problem: Health Behavior/Discharge Planning: Goal: Ability to identify and utilize available resources and services will improve Outcome: Progressing Goal: Ability to manage health-related needs will improve Outcome: Progressing   Problem: Metabolic: Goal: Ability to maintain appropriate glucose levels will improve Outcome: Progressing   Problem: Nutritional: Goal: Maintenance of adequate nutrition will improve Outcome: Progressing Goal: Progress toward achieving an optimal weight will improve Outcome: Progressing   Problem: Skin Integrity: Goal: Risk for impaired skin integrity will decrease Outcome: Progressing   Problem: Tissue Perfusion: Goal: Adequacy of tissue perfusion will improve Outcome: Progressing   

## 2019-07-04 NOTE — Progress Notes (Signed)
Patient placed in low bed.  Verified yellow band, fall sign, no skid socks in place.  Bed alarm active with posey alarm connected.  Fall prevention kit placed in room.   

## 2019-07-04 NOTE — Progress Notes (Signed)
Occupational Therapy Evaluation Patient Details Name: William Hunter MRN: [id number] DOB: [birth year] Today's Date: 07/04/2019    History of Present Illness William Hunter is a 74 y.o. male with a history of RA on humira and sulfasalazine, T2DM, and HTN who presented to the ED with weakness after a fall at home in the setting of about 2 weeks of fatigue, dyspnea, N/V/D. On arrival in the ED by EMS he was mildly hypoxic. SARS-CoV-2 testing was positive, so remdesivir and steroids were provided and the patient was admitted to [facility] this morning. Due to a filling defect noted in the left femoral vein on CT abdomen/pelvis for which venous doppler was ordered and confirmed LLE DVT. Therapeutic anticoagulation is started   Clinical Impression   PTA pt lived with his wife, independent in all ADL, IADL, and mobility tasks. Pt reports occasional use of cane for mobility. Pt still drives. Pt reports that wife cannot assist him at home as she is 72 years old and not able bodied. Pt currently requires setup to mod assist for self-care and functional transfer tasks. Pt tolerated sitting edge of bed ~10 min requiring min assist to maintain seated balance. Pt able to stand pivot to bedside chair with RW and min assist. Noted 0 instances of loss of balance, however pt unsteady on feet. SpO2 decreased to 87% on room air with quick return to 90s following seated rest break. No reports of shortness of breath throughout. Pt demonstrates decreased strength, endurance, balance, standing tolerance, and activity tolerance impacting ability to complete self-care and functional transfer tasks. Recommend skilled OT services to address above deficits in order to promote function and prevent further decline. Recommend SNF placement for additional rehab prior to discharge home.     Follow Up Recommendations  SNF;Supervision/Assistance - 24 hour(Depending on progress in therapy)    Equipment Recommendations  None recommended by OT     Recommendations for Other Services       Precautions / Restrictions Precautions Precautions: Fall Restrictions Weight Bearing Restrictions: No      Mobility Bed Mobility Overal bed mobility: Needs Assistance Bed Mobility: Supine to Sit     Supine to sit: Mod assist;HOB elevated     General bed mobility comments: Use of bedrail  Transfers Overall transfer level: Needs assistance Equipment used: Rolling walker (2 wheeled) Transfers: Sit to/from [employer] Sit to Stand: Mod assist Stand pivot transfers: Min assist       General transfer comment: Assist for balance and safety.     Balance Overall balance assessment: Needs assistance Sitting-balance support: Feet supported Sitting balance-Leahy Scale: Poor       Standing balance-Leahy Scale: Poor                             ADL either performed or assessed with clinical judgement   ADL Overall ADL's : Needs assistance/impaired Eating/Feeding: Set up;Sitting   Grooming: Set up;Supervision/safety;Sitting   Upper Body Bathing: Set up;Supervision/ safety;Sitting   Lower Body Bathing: Minimal assistance;Sit to/from stand   Upper Body Dressing : Set up;Supervision/safety;Sitting   Lower Body Dressing: Supervision/safety;Minimal assistance;Sit to/from stand   Toilet Transfer: Minimal assistance;BSC   Toileting- Clothing Manipulation and Hygiene: Minimal assistance;Sit to/from stand       Functional mobility during ADLs: Rolling walker;Minimal assistance General ADL Comments: Pt able to transfer to bedside chair with RW and min assist for balance and safety. Noted 0 instances of LOB, however pt  unsteady on feet.     Vision Baseline Vision/History: Wears glasses       Perception     Praxis      Pertinent Vitals/Pain Pain Assessment: No/denies pain     Hand Dominance Left   Extremity/Trunk Assessment Upper Extremity Assessment Upper Extremity Assessment: Generalized  weakness   Lower Extremity Assessment Lower Extremity Assessment: Defer to PT evaluation       Communication Communication Communication: No difficulties   Cognition Arousal/Alertness: Awake/alert Behavior During Therapy: WFL for tasks assessed/performed Overall Cognitive Status: Within Functional Limits for tasks assessed                                     General Comments  Pt on room air with SpO2 decreasing to 87% during activity. Noted quick return back to 90s following seated rest break.     Exercises Exercises: Other exercises Other Exercises Other Exercises: Incentive spirometer x 10 with mod cues on technique. Pulling 549mL.   Shoulder Instructions      Home Living Family/patient expects to be discharged to:: Private residence Living Arrangements: Spouse/significant other Available Help at Discharge: Family;Other (Comment)(Wife is 81, poor mobility. Limited support.) Type of Home: House Home Access: Stairs to enter [employer] of Steps: 1   Home Layout: One level     Bathroom Shower/Tub: Occupational psychologist: Standard     Home Equipment: Cane - single point;Grab bars - tub/shower;Walker - 2 wheels;Shower seat - built in          Prior Functioning/Environment Level of Independence: Independent        Comments: Pt independent in ADLs, IADLs, and mobility. Pt reports occasional use of cane for mobility and states 0 falls in the last 6 months. Pt still drives.         OT Problem List: Decreased strength;Decreased activity tolerance;Impaired balance (sitting and/or standing);Cardiopulmonary status limiting activity      OT Treatment/Interventions: Self-care/ADL training;Therapeutic exercise;Neuromuscular education;Energy conservation;DME and/or AE instruction;Therapeutic activities;Patient/family education;Balance training    OT Goals(Current goals can be found in the care plan section) Acute Rehab OT  Goals Patient Stated Goal: to go home Time For Goal Achievement: 07/18/19 Potential to Achieve Goals: Good ADL Goals Pt Will Perform Grooming: with modified independence;standing Pt Will Perform Lower Body Bathing: with modified independence;sit to/from stand Pt Will Perform Lower Body Dressing: with modified independence;sit to/from stand Pt Will Transfer to Toilet: with modified independence;ambulating;regular height toilet Pt Will Perform Toileting - Clothing Manipulation and hygiene: with modified independence;sit to/from stand Additional ADL Goal #1: Pt to recall and verbalize 3 fall prevention strategies with 0 verbal cues. Additional ADL Goal #2: Pt to tolerate standing up to 10 min with modified independence, in preparation for ADLs.  OT Frequency: Min 3X/week   Barriers to D/C: Other (comment)(Limited support from family.)          Co-evaluation              AM-PAC OT "6 Clicks" Daily Activity     Outcome Measure Help from another person eating meals?: A Little Help from another person taking care of personal grooming?: A Little Help from another person toileting, which includes using toliet, bedpan, or urinal?: A Little Help from another person bathing (including washing, rinsing, drying)?: A Little Help from another person to put on and taking off regular upper body clothing?: A Little Help from another person  to put on and taking off regular lower body clothing?: A Little 6 Click Score: 18   End of Session Equipment Utilized During Treatment: Rolling walker;Gait belt Nurse Communication: Mobility status  Activity Tolerance: Patient limited by fatigue Patient left: in chair;with call bell/phone within reach;with chair alarm set  OT Visit Diagnosis: Unsteadiness on feet (R26.81);Muscle weakness (generalized) (M62.81)                Time: 4818-5631 OT Time Calculation (min): 32 min Charges:  OT General Charges $OT Visit: 1 Visit OT Evaluation $OT Eval  Moderate Complexity: 1 Mod OT Treatments $Therapeutic Activity: 8-22 mins  Peterson Ao OTR/L [phone]   Peterson Ao 07/04/2019, 11:00 AM

## 2019-07-04 NOTE — Progress Notes (Signed)
No urine output for shift, bladder scan of 83ml.

## 2019-07-04 NOTE — Progress Notes (Signed)
PROGRESS NOTE  SHAHIN KNIERIM  YPP:[id number] DOB: [birth year] DOA: 07/02/2019 PCP: [name], DO   Brief Narrative: LORAN FLEET is a 74 y.o. male with a history of RA on humira and sulfasalazine, IDT2DM, and HTN who presented to the ED with weakness after a fall at home in the setting of about 2 weeks of fatigue, dyspnea, N/V/D. On arrival in the ED by EMS he was mildly hypoxic. SARS-CoV-2 testing was positive, so remdesivir and steroids were provided and the patient was admitted to [facility]. Due to a filling defect noted in the left femoral vein on CT abdomen/pelvis for which venous doppler was ordered and confirmed LLE DVT. Therapeutic anticoagulation was started. Therapy has recommended SNF prior to return home for rehabilitation to regain PLOF. He continues to desaturate with activity.   Assessment & Plan: Principal Problem:   Acute respiratory failure due to COVID-19 [facility]) Active Problems:   Coronary artery disease   Hypertension   Rheumatoid arthritis ([place])  Acute hypoxemic respiratory failure due to covid-19 pneumonia: SARS-CoV-2 PCR positive on 1/22. CRP 8.5, PCT negative. GGOs noted with lower lobe bibasilar peripheral predominant distribution on CTA chest, no PE. - Has significant allergy to steroids in the past, avoiding decadron. Tocilizumab considered, though it is felt to be contraindicated by clinical stability and use of immunomodulators. CRP has trended downward, still grossly elevated. Ferritin wnl. Tn neg. - Continue remdesivir x5 days 1/22 - 1/26 - Vitamin C, zinc - Encourage OOB, IS, FV - Tylenol and antitussives prn - Continue airborne, contact isolation for 21 days from positive testing. - Check CBC w/diff, CMP, CRP daily - Maintain euvolemia/net negative.  - Avoid NSAIDs   Acute DVT LLE: Based on preliminary interpretation. D-dimer 0.63, no PE on CTA chest, and is asymptomatic from DVT perspective.  - Continue therapeutic dose lovenox. CBC wnl.  Poor urine  output: Recorded overnight, though UOP is normal today and creatinine showing evidence of improvement. - Continue monitoring.  Fall at home:  - CT head, c-spine, L-spine without fracture/dislocation  HTN: BP at goal. - Continue benazepril, diltiazem  IDT2DM: CBG control at inpatient goal. - Continue basal-bolus insulin - Carb-modified diet.  RA:  - Took humira 07/01/2019, continue mesalamine  Hypokalemia:  - Supplement and monitor  DVT prophylaxis: Lovenox 1 mg/kg q12h Code Status: Full Family Communication: None at bedside, pt to relay POC Disposition Plan: SNF once bed available. CSW consulted.  Consultants:   None  Procedures:   None  Antimicrobials:  Remdesivir   Subjective: Left knee is painful, acute on chronic, intermittent, mild-moderate, not associated with swelling. +Stiffness which is at baseline. No dyspnea at rest or with exertion. No chest pain.   Objective: Vitals:   07/03/19 1915 07/04/19 0400 07/04/19 0421 07/04/19 0736  BP: 115/62 109/64  108/70  Pulse: 80 68  69  Resp: 20 17  19   Temp: 98.2 F (36.8 C) 98.3 F (36.8 C)  98 F (36.7 C)  TempSrc: Oral Oral  Oral  SpO2: 95% 95%  94%  Weight:   98.2 kg   Height:        Intake/Output Summary (Last 24 hours) at 07/04/2019 1329 Last data filed at 07/04/2019 0934 Gross per 24 hour  Intake 730 ml  Output 1250 ml  Net -520 ml   Filed Weights   07/03/19 0725 07/04/19 0421  Weight: 97.8 kg 98.2 kg   Gen: 74 y.o. male in no distress Pulm: Non-labored breathing room air at rest. Crackles most  at bases bilaterally.  CV: Regular rate and rhythm. No murmur, rub, or gallop. No JVD, no pedal edema. GI: Abdomen soft, non-tender, non-distended, with normoactive bowel sounds. No organomegaly or masses felt. Ext: Warm, no deformities. Negative homan's bilaterally. Left knee normal appearance without palpable deformity or effusion. No erythema.  Skin: No rashes, lesions or ulcers Neuro: Alert and  oriented. No focal neurological deficits. Psych: Judgement and insight appear normal. Mood & affect appropriate.   Data Reviewed: I have personally reviewed following labs and imaging studies  CBC: Recent Labs  Lab 07/02/19 1450 07/03/19 0309 07/04/19 0440  WBC 7.0 8.7 6.3  NEUTROABS  --  7.5 3.9  HGB 15.7 15.3 13.3  HCT 46.1 45.5 41.0  MCV 89.5 91.5 90.7  PLT 244 236 243   Basic Metabolic Panel: Recent Labs  Lab 07/02/19 1557 07/02/19 2037 07/03/19 0309 07/04/19 0440  NA 135 133* 135 136  K 4.1 3.8 3.8 3.3*  CL 99 99 100 100  CO2 21* 18* 18* 26  GLUCOSE 207* 177* 193* 94  BUN CREATININE 0.94 0.97 1.03 0.79  CALCIUM 9.2 8.4* 8.6* 8.6*   GFR: Estimated Creatinine Clearance: 102.8 mL/min (by C-G formula based on SCr of 0.79 mg/dL). Liver Function Tests: Recent Labs  Lab 07/02/19 1748 07/02/19 2037 07/03/19 0309 07/04/19 0440  AST ALT ALKPHOS 76 71 84 68  BILITOT 1.1 1.2 1.2 0.7  PROT 8.1 7.5 7.4 6.6  ALBUMIN 3.3* 3.1* 3.1* 2.9*   Recent Labs  Lab 07/02/19 1748  LIPASE 31   No results for input(s): AMMONIA in the last 168 hours. Coagulation Profile: No results for input(s): INR, PROTIME in the last 168 hours. Cardiac Enzymes: No results for input(s): CKTOTAL, CKMB, CKMBINDEX, TROPONINI in the last 168 hours. BNP (last 3 results) No results for input(s): PROBNP in the last 8760 hours. HbA1C: No results for input(s): HGBA1C in the last 72 hours. CBG: Recent Labs  Lab 07/03/19 1150 07/03/19 1607 07/03/19 2102 07/04/19 0738 07/04/19 1129  GLUCAP 129* 93 125* 120* 160*   Lipid Profile: Recent Labs    07/02/19 2037  TRIG 106   Thyroid Function Tests: No results for input(s): TSH, T4TOTAL, FREET4, T3FREE, THYROIDAB in the last 72 hours. Anemia Panel: Recent Labs    07/02/19 2037 07/03/19 0309  FERRITIN 219 259   Urine analysis:    Component Value Date/Time   COLORURINE YELLOW 07/03/2019 0154    APPEARANCEUR CLEAR 07/03/2019 0154   LABSPEC >1.046 (H) 07/03/2019 0154   PHURINE 5.0 07/03/2019 0154   GLUCOSEU >=500 (A) 07/03/2019 0154   HGBUR NEGATIVE 07/03/2019 0154   BILIRUBINUR NEGATIVE 07/03/2019 0154   KETONESUR 80 (A) 07/03/2019 0154   PROTEINUR NEGATIVE 07/03/2019 0154   NITRITE NEGATIVE 07/03/2019 0154   LEUKOCYTESUR NEGATIVE 07/03/2019 0154   Recent Results (from the past 240 hour(s))  Respiratory Panel by RT PCR (Flu A&B, Covid) - Nasopharyngeal Swab     Status: Abnormal   Collection Time: 07/02/19  6:39 PM   Specimen: Nasopharyngeal Swab  Result Value Ref Range Status   SARS Coronavirus 2 by RT PCR POSITIVE (A) NEGATIVE Final    Comment: RESULT CALLED TO, READ BACK BY AND VERIFIED WITH: D YAO MD 07/02/19 2031 JDW (NOTE) SARS-CoV-2 target nucleic acids are DETECTED. SARS-CoV-2 RNA is generally detectable in upper respiratory specimens  during the acute phase of infection. Positive results are indicative of the presence  of the identified virus, but do not rule out bacterial infection or co-infection with other pathogens not detected by the test. Clinical correlation with patient history and other diagnostic information is necessary to determine patient infection status. The expected result is Negative. Fact Sheet for Patients:  https://www.moore.com/ Fact Sheet for Healthcare Providers: https://www.young.biz/ This test is not yet approved or cleared by the [place] FDA and  has been authorized for detection and/or diagnosis of SARS-CoV-2 by FDA under an Emergency Use Authorization (EUA).  This EUA will remain in effect (meaning this test can be used) for the dur ation of  the COVID-19 declaration under Section 564(b)(1) of the Act, 21 U.S.C. section 360bbb-3(b)(1), unless the authorization is terminated or revoked sooner.    Influenza A by PCR NEGATIVE NEGATIVE Final   Influenza B by PCR NEGATIVE NEGATIVE Final     Comment: (NOTE) The Xpert Xpress SARS-CoV-2/FLU/RSV assay is intended as an aid in  the diagnosis of influenza from Nasopharyngeal swab specimens and  should not be used as a sole basis for treatment. Nasal washings and  aspirates are unacceptable for Xpert Xpress SARS-CoV-2/FLU/RSV  testing. Fact Sheet for Patients: https://www.moore.com/ Fact Sheet for Healthcare Providers: https://www.young.biz/ This test is not yet approved or cleared by the [place] FDA and  has been authorized for detection and/or diagnosis of SARS-CoV-2 by  FDA under an Emergency Use Authorization (EUA). This EUA will remain  in effect (meaning this test can be used) for the duration of the  Covid-19 declaration under Section 564(b)(1) of the Act, 21  U.S.C. section 360bbb-3(b)(1), unless the authorization is  terminated or revoked. Performed at [facility], [address], [place], [place] [address]   Blood Culture (routine x 2)     Status: None (Preliminary result)   Collection Time: 07/02/19  9:24 PM   Specimen: BLOOD LEFT ARM  Result Value Ref Range Status   Specimen Description BLOOD LEFT ARM  Final   Special Requests   Final    BOTTLES DRAWN AEROBIC AND ANAEROBIC Blood Culture results may not be optimal due to an inadequate volume of blood received in culture bottles   Culture   Final    NO GROWTH 2 DAYS Performed at [facility], [address], [place], [place] [address]    Report Status PENDING  Incomplete  Blood Culture (routine x 2)     Status: None (Preliminary result)   Collection Time: 07/02/19  9:24 PM   Specimen: BLOOD LEFT ARM  Result Value Ref Range Status   Specimen Description BLOOD LEFT ARM  Final   Special Requests   Final    BOTTLES DRAWN AEROBIC AND ANAEROBIC Blood Culture results may not be optimal due to an inadequate volume of blood received in culture bottles   Culture   Final    NO GROWTH 2 DAYS Performed at [facility], [address] [place], [place] [address]    Report Status PENDING  Incomplete      Radiology Studies: DG Chest 2 View  Result Date: 07/02/2019 CLINICAL DATA:  Weakness for 2 weeks. The patient suffered a fall today. EXAM: CHEST - 2 VIEW COMPARISON:  PA and lateral chest 09/16/2016. FINDINGS: Lung volumes are lower than on the comparison examination. Mild, streaky opacities are seen in the left lung base. No pneumothorax or pleural effusion. Heart size is normal. No acute or focal bony abnormality. IMPRESSION: Mild, streaky left basilar opacities are likely due to  atelectasis. No acute disease. Electronically Signed   By: Drusilla Kanner M.D.   On: 07/02/2019 15:05   CT Head Wo Contrast  Result Date: 07/02/2019 CLINICAL DATA:  Head trauma, fell earlier today, weakness, cough, history coronary artery disease post MI, hypertension, type II diabetes mellitus EXAM: CT HEAD WITHOUT CONTRAST CT CERVICAL SPINE WITHOUT CONTRAST TECHNIQUE: Multidetector CT imaging of the head and cervical spine was performed following the standard protocol without intravenous contrast. Multiplanar CT image reconstructions of the cervical spine were also generated. COMPARISON:  CT head 09/16/2016 FINDINGS: CT HEAD FINDINGS Brain: Generalized atrophy with ex vacuo dilatation of the ventricular system. Prominent cisterna magna. No midline shift or mass effect. Small vessel chronic ischemic changes of deep cerebral white matter. No intracranial hemorrhage, mass lesion, or evidence of acute infarction. No extra-axial fluid collections. Vascular: No hyperdense vessels. Atherosclerotic calcifications of internal carotid arteries and LEFT vertebral artery at skull base Skull: Intact Sinuses/Orbits: Clear Other: N/A CT CERVICAL SPINE FINDINGS Alignment: Minimal anterolisthesis at C4-C5. Remaining alignments normal Skull base and vertebrae: Osseous demineralization. Significant degenerative changes at articulation of odontoid  process with anterior arch of C1. Skull base intact. Multilevel disc space narrowing and endplate spur formation. Multilevel facet degenerative changes. Encroachment upon LEFT C4-C5 foramen by uncovertebral and facet hypertrophy. No fracture, additional subluxation or bone destruction. Soft tissues and spinal canal: Prevertebral soft tissues normal thickness. Disc levels:  No additional abnormalities Upper chest: Lung apices clear Other: N/A IMPRESSION: Atrophy with small vessel chronic ischemic changes of deep cerebral white matter. No acute intracranial abnormalities. Multilevel degenerative disc and facet disease changes of the cervical spine. No acute cervical spine abnormalities. Electronically Signed   By: Ulyses Southward M.D.   On: 07/02/2019 21:03   CT Angio Chest PE W and/or Wo Contrast  Result Date: 07/02/2019 CLINICAL DATA:  Shortness of breath. Cough and weakness. Fall earlier today. EXAM: CT ANGIOGRAPHY CHEST WITH CONTRAST TECHNIQUE: Multidetector CT imaging of the chest was performed using the standard protocol during bolus administration of intravenous contrast. Multiplanar CT image reconstructions and MIPs were obtained to evaluate the vascular anatomy. CONTRAST:  OMNIPAQUE IOHEXOL 300 MG/ML  SOLN COMPARISON:  Radiograph earlier this day FINDINGS: Cardiovascular: There are no filling defects within the pulmonary arteries to suggest pulmonary embolus. Mild aortic atherosclerosis and tortuosity. No aortic dissection. Heart is normal in size. No pericardial effusion. Mediastinum/Nodes: No enlarged mediastinal or hilar lymph nodes. No thyroid nodule. No esophageal wall thickening. Tiny hiatal hernia. Lungs/Pleura: Heterogeneous ground-glass opacities in the peripheral predominant distribution. Findings most prominent in the lower lobes, however there also patchy opacities in the upper lobes and right middle lobe. No septal thickening or findings of pulmonary edema. No pleural fluid. Trachea and  mainstem bronchi are patent. Upper Abdomen: Assessed on concurrent abdominal CT, reported separately. Cholecystectomy. Musculoskeletal: There are no acute or suspicious osseous abnormalities. Degenerative change in the thoracic spine. No acute fracture. No chest wall contusion. Review of the MIP images confirms the above findings. IMPRESSION: 1. No pulmonary embolus. 2. Heterogeneous ground-glass opacities in the peripheral predominant distribution, most prominent in the lower lobes. Findings are suspicious for COVID pneumonia. Other inflammatory or other atypical infectious etiologies are also considered. Aortic Atherosclerosis (ICD10-I70.0). Electronically Signed   By: Narda Rutherford M.D.   On: 07/02/2019 21:08   CT Cervical Spine Wo Contrast  Result Date: 07/02/2019 CLINICAL DATA:  Head trauma, fell earlier today, weakness, cough, history coronary artery disease post MI, hypertension, type II diabetes mellitus  EXAM: CT HEAD WITHOUT CONTRAST CT CERVICAL SPINE WITHOUT CONTRAST TECHNIQUE: Multidetector CT imaging of the head and cervical spine was performed following the standard protocol without intravenous contrast. Multiplanar CT image reconstructions of the cervical spine were also generated. COMPARISON:  CT head 09/16/2016 FINDINGS: CT HEAD FINDINGS Brain: Generalized atrophy with ex vacuo dilatation of the ventricular system. Prominent cisterna magna. No midline shift or mass effect. Small vessel chronic ischemic changes of deep cerebral white matter. No intracranial hemorrhage, mass lesion, or evidence of acute infarction. No extra-axial fluid collections. Vascular: No hyperdense vessels. Atherosclerotic calcifications of internal carotid arteries and LEFT vertebral artery at skull base Skull: Intact Sinuses/Orbits: Clear Other: N/A CT CERVICAL SPINE FINDINGS Alignment: Minimal anterolisthesis at C4-C5. Remaining alignments normal Skull base and vertebrae: Osseous demineralization. Significant  degenerative changes at articulation of odontoid process with anterior arch of C1. Skull base intact. Multilevel disc space narrowing and endplate spur formation. Multilevel facet degenerative changes. Encroachment upon LEFT C4-C5 foramen by uncovertebral and facet hypertrophy. No fracture, additional subluxation or bone destruction. Soft tissues and spinal canal: Prevertebral soft tissues normal thickness. Disc levels:  No additional abnormalities Upper chest: Lung apices clear Other: N/A IMPRESSION: Atrophy with small vessel chronic ischemic changes of deep cerebral white matter. No acute intracranial abnormalities. Multilevel degenerative disc and facet disease changes of the cervical spine. No acute cervical spine abnormalities. Electronically Signed   By: Ulyses Southward M.D.   On: 07/02/2019 21:03   CT ABDOMEN PELVIS W CONTRAST  Result Date: 07/02/2019 CLINICAL DATA:  Abdominal trauma. Weakness and cough. Pain status post fall. EXAM: CT ABDOMEN AND PELVIS WITH CONTRAST CT LUMBAR SPINE WITHOUT CONTRAST TECHNIQUE: Multidetector CT imaging of the abdomen and pelvis was performed using the standard protocol following bolus administration of intravenous contrast. Multiplanar CT images of the lumbar spine was reconstructed from contemporary CT of the Abdomen, and Pelvis CONTRAST:  OMNIPAQUE IOHEXOL 300 MG/ML  SOLN COMPARISON:  CT of the pelvis dated 09/16/2016. FINDINGS: Lower chest: There are peripheral ground-glass airspace opacities noted at the lung bases.Coronary artery calcifications are noted. The heart size appears relatively normal. Hepatobiliary: The liver is normal. Status post cholecystectomy.There is no biliary ductal dilation. Pancreas: Normal contours without ductal dilatation. No peripancreatic fluid collection. Spleen: No splenic laceration or hematoma. Adrenals/Urinary Tract: --Adrenal glands: No adrenal hemorrhage. --Right kidney/ureter: No hydronephrosis or perinephric hematoma. --Left  kidney/ureter: No hydronephrosis or perinephric hematoma. --Urinary bladder: Unremarkable. Stomach/Bowel: --Stomach/Duodenum: No hiatal hernia or other gastric abnormality. Normal duodenal course and caliber. --Small bowel: No dilatation or inflammation. --Colon: No focal abnormality. --Appendix: Not visualized. No right lower quadrant inflammation or free fluid. Vascular/Lymphatic: Atherosclerotic calcification is present within the non-aneurysmal abdominal aorta, without hemodynamically significant stenosis. There is a questionable filling defect in the partially visualized proximal left superficial femoral vein. --No retroperitoneal lymphadenopathy. --No mesenteric lymphadenopathy. --No pelvic or inguinal lymphadenopathy. Reproductive: Unremarkable Other: No ascites or free air. The abdominal wall is normal. Musculoskeletal. There are degenerative changes of both hips. There is no acute displaced fracture involving the lumbar spine. Mild-to-moderate multilevel degenerative changes are noted of the lumbar spine. IMPRESSION: 1. No acute intra-abdominal abnormality detected. 2. No acute lumbar spine fracture. 3. Incidentally noted peripheral ground-glass airspace opacities at the lung bases. Findings are concerning for an atypical infectious process in the appropriate clinical setting. 4. Questionable filling defect in the partially visualized left superficial femoral vein. Follow-up with a lower extremity venous ultrasound is recommended to help exclude a lower extremity DVT. Aortic  Atherosclerosis (ICD10-I70.0). Electronically Signed   By: Katherine Mantle M.D.   On: 07/02/2019 21:08   CT L-SPINE NO CHARGE  Result Date: 07/02/2019 CLINICAL DATA:  Abdominal trauma. Weakness and cough. Pain status post fall. EXAM: CT ABDOMEN AND PELVIS WITH CONTRAST CT LUMBAR SPINE WITHOUT CONTRAST TECHNIQUE: Multidetector CT imaging of the abdomen and pelvis was performed using the standard protocol following bolus  administration of intravenous contrast. Multiplanar CT images of the lumbar spine was reconstructed from contemporary CT of the Abdomen, and Pelvis CONTRAST:  OMNIPAQUE IOHEXOL 300 MG/ML  SOLN COMPARISON:  CT of the pelvis dated 09/16/2016. FINDINGS: Lower chest: There are peripheral ground-glass airspace opacities noted at the lung bases.Coronary artery calcifications are noted. The heart size appears relatively normal. Hepatobiliary: The liver is normal. Status post cholecystectomy.There is no biliary ductal dilation. Pancreas: Normal contours without ductal dilatation. No peripancreatic fluid collection. Spleen: No splenic laceration or hematoma. Adrenals/Urinary Tract: --Adrenal glands: No adrenal hemorrhage. --Right kidney/ureter: No hydronephrosis or perinephric hematoma. --Left kidney/ureter: No hydronephrosis or perinephric hematoma. --Urinary bladder: Unremarkable. Stomach/Bowel: --Stomach/Duodenum: No hiatal hernia or other gastric abnormality. Normal duodenal course and caliber. --Small bowel: No dilatation or inflammation. --Colon: No focal abnormality. --Appendix: Not visualized. No right lower quadrant inflammation or free fluid. Vascular/Lymphatic: Atherosclerotic calcification is present within the non-aneurysmal abdominal aorta, without hemodynamically significant stenosis. There is a questionable filling defect in the partially visualized proximal left superficial femoral vein. --No retroperitoneal lymphadenopathy. --No mesenteric lymphadenopathy. --No pelvic or inguinal lymphadenopathy. Reproductive: Unremarkable Other: No ascites or free air. The abdominal wall is normal. Musculoskeletal. There are degenerative changes of both hips. There is no acute displaced fracture involving the lumbar spine. Mild-to-moderate multilevel degenerative changes are noted of the lumbar spine. IMPRESSION: 1. No acute intra-abdominal abnormality detected. 2. No acute lumbar spine fracture. 3. Incidentally  noted peripheral ground-glass airspace opacities at the lung bases. Findings are concerning for an atypical infectious process in the appropriate clinical setting. 4. Questionable filling defect in the partially visualized left superficial femoral vein. Follow-up with a lower extremity venous ultrasound is recommended to help exclude a lower extremity DVT. Aortic Atherosclerosis (ICD10-I70.0). Electronically Signed   By: Katherine Mantle M.D.   On: 07/02/2019 21:08   VAS [place] LOWER EXTREMITY VENOUS (DVT) ([facility] and [facility] 7a-7p)  Result Date: 07/03/2019  Lower Venous Study Indications: CT Filling defect.  Risk Factors: COVID 19 positive. Comparison Study: No prior studies. Performing Technologist: Chanda Busing RVT  Examination Guidelines: A complete evaluation includes B-mode imaging, spectral Doppler, color Doppler, and power Doppler as needed of all accessible portions of each vessel. Bilateral testing is considered an integral part of a complete examination. Limited examinations for reoccurring indications may be performed as noted.  +-----+---------------+---------+-----------+----------+--------------+ RIGHTCompressibilityPhasicitySpontaneityPropertiesThrombus Aging +-----+---------------+---------+-----------+----------+--------------+ CFV  Full           Yes      Yes                                 +-----+---------------+---------+-----------+----------+--------------+   +---------+---------------+---------+-----------+----------+--------------+ LEFT     CompressibilityPhasicitySpontaneityPropertiesThrombus Aging +---------+---------------+---------+-----------+----------+--------------+ CFV      Full           Yes      Yes                                 +---------+---------------+---------+-----------+----------+--------------+ SFJ  Full                                                        +---------+---------------+---------+-----------+----------+--------------+  FV Prox  Full                                                        +---------+---------------+---------+-----------+----------+--------------+ FV Mid   Full                                                        +---------+---------------+---------+-----------+----------+--------------+ FV DistalFull                                                        +---------+---------------+---------+-----------+----------+--------------+ PFV      Partial        Yes      Yes                  Acute          +---------+---------------+---------+-----------+----------+--------------+ POP      Full           Yes      Yes                                 +---------+---------------+---------+-----------+----------+--------------+ PTV      Full                                                        +---------+---------------+---------+-----------+----------+--------------+ PERO     Full                                                        +---------+---------------+---------+-----------+----------+--------------+     Summary: Right: No evidence of common femoral vein obstruction. Left: Findings consistent with acute deep vein thrombosis involving the left proximal profunda vein. No cystic structure found in the popliteal fossa.  *See table(s) above for measurements and observations.    Preliminary     Scheduled Meds: . benazepril  10 mg Oral BID  . diltiazem  300 mg Oral Daily  . enoxaparin (LOVENOX) injection  1 mg/kg Subcutaneous Q12H  . influenza vaccine adjuvanted  0.5 mL Intramuscular Tomorrow-1000  . insulin aspart  0-9 Units Subcutaneous TID WC  . insulin aspart  20 Units Subcutaneous TID AC  . insulin glargine  40 Units Subcutaneous QHS  . pneumococcal 23 valent vaccine  0.5 mL Intramuscular Tomorrow-1000  . sodium chloride  flush  3 mL Intravenous Once  . sulfaSALAzine  1,500 mg Oral BID   Continuous Infusions: . remdesivir 100 mg in NS 100 mL Stopped  (07/04/19 0716)     LOS: 1 day   Time spent: 25 minutes.  Tyrone Nine, MD [facility] [email or web address] 07/04/2019, 1:29 PM

## 2019-07-05 LAB — COMPREHENSIVE METABOLIC PANEL
ALT: 16 U/L (ref 0–44)
AST: 15 U/L (ref 15–41)
Albumin: 2.8 g/dL — ABNORMAL LOW (ref 3.5–5.0)
Alkaline Phosphatase: 67 U/L (ref 38–126)
Anion gap: 8 (ref 5–15)
BUN: 19 mg/dL (ref 8–23)
CO2: 24 mmol/L (ref 22–32)
Calcium: 8.5 mg/dL — ABNORMAL LOW (ref 8.9–10.3)
Chloride: 103 mmol/L (ref 98–111)
Creatinine, Ser: 0.77 mg/dL (ref 0.61–1.24)
GFR calc Af Amer: >60 mL/min (ref >60)
GFR calc non Af Amer: >60 mL/min (ref >60)
Glucose, Bld: 97 mg/dL (ref 70–99)
Potassium: 3.6 mmol/L (ref 3.5–5.1)
Sodium: 135 mmol/L (ref 135–145)
Total Bilirubin: 0.5 mg/dL (ref 0.3–1.2)
Total Protein: 6.4 g/dL — ABNORMAL LOW (ref 6.5–8.1)

## 2019-07-05 LAB — GLUCOSE, CAPILLARY
Glucose-Capillary: 114 mg/dL — ABNORMAL HIGH (ref 70–99)
Glucose-Capillary: 121 mg/dL — ABNORMAL HIGH (ref 70–99)
Glucose-Capillary: 110 mg/dL — ABNORMAL HIGH (ref 70–99)
Glucose-Capillary: 82 mg/dL (ref 70–99)

## 2019-07-05 LAB — C-REACTIVE PROTEIN: CRP: 6.3 mg/dL — ABNORMAL HIGH (ref <1.0)

## 2019-07-05 MED ORDER — SODIUM CHLORIDE 0.9 % IV SOLN
100.0000 mg | INTRAVENOUS | Status: DC
  Administered 2019-07-05: 100 mg via INTRAVENOUS
  Filled 2019-07-05: qty 20

## 2019-07-05 MED ORDER — IBUPROFEN 400 MG PO TABS
400.0000 mg | ORAL_TABLET | Freq: Four times a day (QID) | ORAL | Status: DC | PRN
  Administered 2019-07-06: 09:00:00 400 mg via ORAL
  Filled 2019-07-05 (×2): qty 1

## 2019-07-05 NOTE — Evaluation (Signed)
Physical Therapy Evaluation Patient Details Name: William Hunter MRN: [id number] DOB: 1945-11-24 Today's Date: 07/05/2019   History of Present Illness  William Hunter is a 74 y.o. male with a history of RA on humira and sulfasalazine, T2DM, and HTN who presented to the ED with weakness after a fall at home in the setting of about 2 weeks of fatigue, dyspnea, N/V/D. On arrival in the ED by EMS he was mildly hypoxic. SARS-CoV-2 testing was positive, so remdesivir and steroids were provided and the patient was admitted to [facility] this morning. Due to a filling defect noted in the left femoral vein on CT abdomen/pelvis for which venous doppler was ordered and confirmed LLE DVT. Therapeutic anticoagulation is started  Clinical Impression  The patient is very sluggish and imbalanced when ambulating x 15'. Patient's BP  Supine 123/70 and 98/46 after ambulation, SPO2 88-92% on RA, HR 75-86. RN notified.Pt admitted with above diagnosis.   Pt currently with functional limitations due to the deficits listed below (see PT Problem List). Pt will benefit from skilled PT to increase their independence and safety with mobility to allow discharge to the venue listed below.       Follow Up Recommendations SNF    Equipment Recommendations  None recommended by PT    Recommendations for Other Services       Precautions / Restrictions Precautions Precautions: Fall Precaution Comments: check orthostatics      Mobility  Bed Mobility   Bed Mobility: Supine to Sit;Sit to Supine     Supine to sit: Mod assist Sit to supine: Mod assist   General bed mobility comments: assist with legs and trunk  Transfers Overall transfer level: Needs assistance Equipment used: Rolling walker (2 wheeled) Transfers: Sit to/from Stand Sit to Stand: Mod assist         General transfer comment: Assist for balance and safety.   Ambulation/Gait Ambulation/Gait assistance: Mod assist Gait Distance (Feet): 15 Feet Assistive  device: Rolling walker (2 wheeled) Gait Pattern/deviations: Step-to pattern;Drifts right/left Gait velocity: decr   General Gait Details: cues for posture  Stairs            Wheelchair Mobility    Modified Rankin (Stroke Patients Only)       Balance Overall balance assessment: History of Falls Sitting-balance support: Feet supported;Bilateral upper extremity supported Sitting balance-Leahy Scale: Fair     Standing balance support: Bilateral upper extremity supported;During functional activity Standing balance-Leahy Scale: Poor                               Pertinent Vitals/Pain Pain Assessment: No/denies pain    Home Living Family/patient expects to be discharged to:: Private residence Living Arrangements: Spouse/significant other Available Help at Discharge: Family;Other (Comment) Type of Home: House Home Access: Stairs to enter   Entrance Stairs-Number of Steps: 1 Home Layout: One level Home Equipment: Cane - single point;Grab bars - tub/shower;Walker - 2 wheels;Shower seat - built in      Prior Function Level of Independence: Independent         Comments: Pt independent in ADLs, IADLs, and mobility. Pt reports occasional use of cane for mobility and states 0 falls in the last 6 months. Pt still drives.      Hand Dominance        Extremity/Trunk Assessment   Upper Extremity Assessment Upper Extremity Assessment: Generalized weakness    Lower Extremity Assessment Lower Extremity Assessment: Generalized  weakness    Cervical / Trunk Assessment Cervical / Trunk Assessment: Other exceptions Cervical / Trunk Exceptions: tends to keep head forward flexed  Communication   Communication: No difficulties  Cognition Arousal/Alertness: Awake/alert Behavior During Therapy: Flat affect Overall Cognitive Status: No family/caregiver present to determine baseline cognitive functioning                                 General  Comments: slow to move      General Comments      Exercises Other Exercises Other Exercises: flutter x 10   Assessment/Plan    PT Assessment Patient needs continued PT services  PT Problem List Decreased strength;Decreased mobility       PT Treatment Interventions DME instruction;Therapeutic activities;Gait training;Therapeutic exercise;Patient/family education;Functional mobility training;Balance training    PT Goals (Current goals can be found in the Care Plan section)  Acute Rehab PT Goals Patient Stated Goal: to go home PT Goal Formulation: With patient Time For Goal Achievement: 07/19/19 Potential to Achieve Goals: Fair    Frequency Min 2X/week   Barriers to discharge        Co-evaluation               AM-PAC PT "6 Clicks" Mobility  Outcome Measure Help needed turning from your back to your side while in a flat bed without using bedrails?: A Lot Help needed moving from lying on your back to sitting on the side of a flat bed without using bedrails?: A Lot Help needed moving to and from a bed to a chair (including a wheelchair)?: A Lot Help needed standing up from a chair using your arms (e.g., wheelchair or bedside chair)?: A Lot Help needed to walk in hospital room?: A Lot Help needed climbing 3-5 steps with a railing? : Total 6 Click Score: 11    End of Session Equipment Utilized During Treatment: Gait belt Activity Tolerance: Patient limited by fatigue Patient left: in bed;with call bell/phone within reach;with bed alarm set Nurse Communication: Mobility status PT Visit Diagnosis: Muscle weakness (generalized) (M62.81);Difficulty in walking, not elsewhere classified (R26.2)    Time: 7858-8502 PT Time Calculation (min) (ACUTE ONLY): 36 min   Charges:   PT Evaluation $PT Eval Moderate Complexity: 1 Mod PT Treatments $Gait Training: 8-22 mins        Blanchard Kelch PT Acute Rehabilitation Services Pager [phone] Office  [phone]   Rada Hay 07/05/2019, 5:43 PM

## 2019-07-05 NOTE — NC FL2 (Signed)
[place] LEVEL OF CARE SCREENING TOOL     IDENTIFICATION  Patient Name: William Hunter Birthdate: 1946-01-23 Sex: male Admission Date (Current Location): 07/02/2019  [facility] and [place] Number:  Herbalist and Address:  [facility], [place], [place])      Provider Number: [id number]  Attending Physician Name and Address:  Patrecia Pour, MD  Relative Name and Phone Number:       Current Level of Care: Hospital Recommended Level of Care: Man Prior Approval Number:    Date Approved/Denied:   PASRR Number: [id number]  Discharge Plan: SNF    Current Diagnoses: Patient Active Problem List   Diagnosis Date Noted  . Acute respiratory failure with hypoxia ([place]) 07/03/2019  . Acute respiratory failure due to COVID-19 ([place]) 07/03/2019  . Migraine with aura and without status migrainosus, not intractable 09/02/2016  . Diabetic polyneuropathy associated with type 2 diabetes mellitus ([place]) 09/02/2016  . Rheumatoid arthritis ([place]) 09/02/2016  . Coronary vasospasm ([place]) 04/06/2014  . Gallstones 03/10/2012  . Abdominal pain 03/09/2012  . MI, old   . Coronary artery disease   . Hypertension   . Dyslipidemia     Orientation RESPIRATION BLADDER Height & Weight     Self, Time, Situation  O2(SEE DC SUMMARY) Continent Weight: 98.2 kg Height:  6' (182.9 cm)  BEHAVIORAL SYMPTOMS/MOOD NEUROLOGICAL BOWEL NUTRITION STATUS      Continent Diet(SEE DC SUMMARY)  AMBULATORY STATUS COMMUNICATION OF NEEDS Skin   Extensive Assist Verbally Normal                       Personal Care Assistance Level of Assistance  Bathing, Feeding, Dressing Bathing Assistance: Limited assistance Feeding assistance: Independent Dressing Assistance: Limited assistance     Functional Limitations Info  Sight, Hearing, Speech Sight Info: Adequate Hearing Info: Adequate Speech Info:  Adequate    SPECIAL CARE FACTORS FREQUENCY  OT (By licensed OT)     PT Frequency: PT EVAL PENDING OT Frequency: 3X/WEEK            Contractures Contractures Info: Not present    Additional Factors Info  Code Status, Allergies Code Status Info: FULL Allergies Info: CODEINE, PENICILLIN, WOOL ALCOHOL(LANOLIN), PREDNISONE           Current Medications (07/05/2019):  This is the current hospital active medication list Current Facility-Administered Medications  Medication Dose Route Frequency Provider Last Rate Last Admin  . acetaminophen (TYLENOL) tablet 650 mg  650 mg Oral Q6H PRN Rise Patience, MD   650 mg at 07/04/19 2114   Or  . acetaminophen (TYLENOL) suppository 650 mg  650 mg Rectal Q6H PRN Rise Patience, MD      . benazepril (LOTENSIN) tablet 10 mg  10 mg Oral BID Rise Patience, MD   10 mg at 07/05/19 1105  . diltiazem (CARDIZEM CD) 24 hr capsule 300 mg  300 mg Oral Daily Rise Patience, MD   300 mg at 07/05/19 1105  . enoxaparin (LOVENOX) injection 100 mg  1 mg/kg Subcutaneous Q12H Patrecia Pour, MD   100 mg at 07/05/19 0755  . ibuprofen (ADVIL) tablet 400 mg  400 mg Oral Q6H PRN Vance Gather B, MD      . influenza vaccine adjuvanted (FLUAD) injection 0.5 mL  0.5 mL Intramuscular Tomorrow-1000 Rise Patience, MD      . insulin aspart (novoLOG)  injection 0-9 Units  0-9 Units Subcutaneous TID WC Eduard Clos, MD   1 Units at 07/05/19 1238  . insulin aspart (novoLOG) injection 20 Units  20 Units Subcutaneous TID AC Eduard Clos, MD   20 Units at 07/05/19 1239  . insulin glargine (LANTUS) injection 40 Units  40 Units Subcutaneous QHS Eduard Clos, MD   40 Units at 07/04/19 2115  . ondansetron (ZOFRAN) tablet 4 mg  4 mg Oral Q6H PRN Eduard Clos, MD       Or  . ondansetron [facility]) injection 4 mg  4 mg Intravenous Q6H PRN Eduard Clos, MD   4 mg at 07/04/19 1130  . pneumococcal 23 valent vaccine  (PNEUMOVAX-23) injection 0.5 mL  0.5 mL Intramuscular Tomorrow-1000 Eduard Clos, MD      . remdesivir 100 mg in sodium chloride 0.9 % 100 mL IVPB  100 mg Intravenous Q24H Hazeline Junker B, MD 200 mL/hr at 07/05/19 1255 100 mg at 07/05/19 1255  . sodium chloride flush (NS) 0.9 % injection 3 mL  3 mL Intravenous Once Eduard Clos, MD      . sulfaSALAzine (AZULFIDINE) tablet 1,500 mg  1,500 mg Oral BID Eduard Clos, MD   1,500 mg at 07/05/19 1105  . traZODone (DESYREL) tablet 50 mg  50 mg Oral QHS PRN Tyrone Nine, MD   50 mg at 07/04/19 2114     Discharge Medications: Please see discharge summary for a list of discharge medications.  Relevant Imaging Results:  Relevant Lab Results:   Additional Information SSN [id number]  Halsey Persaud, Chrystine Oiler, RN

## 2019-07-05 NOTE — Plan of Care (Signed)
Patient received to room 307. NAD noted.

## 2019-07-05 NOTE — TOC Initial Note (Signed)
Transition of Care [facility]) - Initial/Assessment Note    Patient Details  Name: William Hunter MRN: [id number] Date of Birth: March 18, 1946  Transition of Care [facility]) CM/SW Contact:    Abie Cheek, Chrystine Oiler, RN Phone Number: 07/05/2019, 5:20 PM  Clinical Narrative: Covid +, recent fall.   Seen by OT recommended for SNF. Awaiting PT eval, anticipate will have SNF recommendation.      Discussed with patient, he is agreeable to referral to SNF's. Would prefer to stay local to [facility] if possible.              Expected Discharge Plan: Skilled Nursing Facility Barriers to Discharge: SNF Pending bed offer   Patient Goals and CMS Choice Patient states their goals for this hospitalization and ongoing recovery are:: agreeable to SNF but wants to go home soon CMS Medicare.gov Compare Post Acute Care list provided to:: Patient Choice offered to / list presented to : Patient  Expected Discharge Plan and Services Expected Discharge Plan: Skilled Nursing Facility   Discharge Planning Services: CM Consult Post Acute Care Choice: Nursing Home   Expected Discharge Date: 07/05/19                                    Prior Living Arrangements/Services   Lives with:: Spouse              Current home services: DME(cane, prn)    Activities of Daily Living Home Assistive Devices/Equipment: None ADL Screening (condition at time of admission) Patient's cognitive ability adequate to safely complete daily activities?: Yes Is the patient deaf or have difficulty hearing?: No Does the patient have difficulty seeing, even when wearing glasses/contacts?: No Does the patient have difficulty concentrating, remembering, or making decisions?: Yes Patient able to express need for assistance with ADLs?: Yes Does the patient have difficulty dressing or bathing?: No Independently performs ADLs?: Yes (appropriate for developmental age) Does the patient have difficulty walking or climbing stairs?:  No Weakness of Legs: Both Weakness of Arms/Hands: Both  Permission Sought/Granted                  Emotional Assessment   Attitude/Demeanor/Rapport: Engaged Affect (typically observed): Accepting, Appropriate Orientation: : Oriented to Self, Oriented to Place, Oriented to  Time, Oriented to Situation Alcohol / Substance Use: Alcohol Use Psych Involvement: No (comment)  Admission diagnosis:  Back pain [M54.9] Acute respiratory failure with hypoxia ([name] [J96.01] Fall, initial encounter [W19.XXXA] Acute respiratory failure due to COVID-19 ([name] [U07.1, J96.00] Acute hypoxemic respiratory failure due to COVID-19 ([name] [U07.1, J96.01] Patient Active Problem List   Diagnosis Date Noted  . Acute respiratory failure with hypoxia ([name] 07/03/2019  . Acute respiratory failure due to COVID-19 ([name] 07/03/2019  . Migraine with aura and without status migrainosus, not intractable 09/02/2016  . Diabetic polyneuropathy associated with type 2 diabetes mellitus ([name] 09/02/2016  . Rheumatoid arthritis ([name] 09/02/2016  . Coronary vasospasm ([name] 04/06/2014  . Gallstones 03/10/2012  . Abdominal pain 03/09/2012  . MI, old   . Coronary artery disease   . Hypertension   . Dyslipidemia    PCP:  Irena Reichmann, DO Pharmacy:   [facility] [address] [place] - [address] [place] [address] Phone: [phone] Fax: [phone]     Social Determinants of Health (SDOH) Interventions    Readmission Risk Interventions No flowsheet data found.

## 2019-07-05 NOTE — Progress Notes (Signed)
PROGRESS NOTE  William Hunter  MHD:[id number] DOB: [birth year] DOA: 07/02/2019 PCP: [name], DO   Brief Narrative: William Hunter is a 74 y.o. male with a history of RA on humira and sulfasalazine, IDT2DM, and HTN who presented to the ED with weakness after a fall at home in the setting of about 2 weeks of fatigue, dyspnea, N/V/D. On arrival in the ED by EMS he was mildly hypoxic. SARS-CoV-2 testing was positive, so remdesivir and steroids were provided and the patient was admitted to [facility]. Due to a filling defect noted in the left femoral vein on CT abdomen/pelvis for which venous doppler was ordered and confirmed LLE DVT. Therapeutic anticoagulation was started. Therapy has recommended SNF prior to return home for rehabilitation to regain PLOF. He continues to desaturate with activity.   Assessment & Plan: Principal Problem:   Acute respiratory failure due to COVID-19 [facility]) Active Problems:   Coronary artery disease   Hypertension   Rheumatoid arthritis ([place])  Acute hypoxemic respiratory failure due to covid-19 pneumonia: SARS-CoV-2 PCR positive on 1/22. CRP 8.5, PCT negative. GGOs noted with lower lobe bibasilar peripheral predominant distribution on CTA chest, no PE. - Has significant allergy to steroids in the past, avoiding decadron. Tocilizumab considered, though it is felt to be contraindicated by clinical stability and use of immunomodulators. CRP has trended downward, remains elevated. - Continue remdesivir x5 days 1/22 - 1/26 - Vitamin C, zinc - Encourage OOB, IS, FV - Tylenol and antitussives prn - Continue airborne, contact isolation for 21 days from positive testing. - Maintain euvolemia/net negative.   Acute DVT LLE: Based on preliminary interpretation. D-dimer 0.63, no PE on CTA chest, and is asymptomatic from DVT perspective.  - Continue therapeutic dose lovenox. CBC wnl.  Poor urine output: Resolved.  Fall at home:  - CT head, c-spine, L-spine without  fracture/dislocation  HTN: BP at goal. - Continue benazepril, diltiazem  IDT2DM: CBG control still at inpatient goal. - Continue basal-bolus insulin - Carb-modified diet.  RA:  - Took humira 07/01/2019, continue mesalamine - Give ibuprofen prn as NSAID administration has not been proven to be detrimental (risk < benefit for mobility)  Hypokalemia: Resolved. - Supplement and monitor  DVT prophylaxis: Lovenox 1 mg/kg q12h Code Status: Full Family Communication: None at bedside, pt to relay POC Disposition Plan: SNF once bed available. CSW consulted.  Consultants:   None  Procedures:   None  Antimicrobials:  Remdesivir 1/22 - 1/26  Subjective: Left knee pain is chronic, stable, usually improved with advil. No shortness of breath. Feels weak.   Objective: Vitals:   07/04/19 1640 07/04/19 1918 07/05/19 0215 07/05/19 0737  BP: 108/68 (!) 104/59 98/60 105/65  Pulse: 67 78 66 67  Resp: 19 15 18 16   Temp: 98.3 F (36.8 C) 99.4 F (37.4 C) 98 F (36.7 C) 98 F (36.7 C)  TempSrc: Oral Oral Oral Oral  SpO2: 96% 95% 92% 92%  Weight:      Height:        Intake/Output Summary (Last 24 hours) at 07/05/2019 1133 Last data filed at 07/05/2019 0800 Gross per 24 hour  Intake 960 ml  Output 1400 ml  Net -440 ml   Filed Weights   07/03/19 0725 07/04/19 0421  Weight: 97.8 kg 98.2 kg   Gen: 74 y.o. male in no distress Pulm: Nonlabored breathing room air. Crackles L > R, scant. CV: Regular rate and rhythm. No murmur, rub, or gallop. No JVD, no dependent edema. GI: Abdomen  soft, non-tender, non-distended, with normoactive bowel sounds.  Ext: Warm, no deformities. No visible or palpable left knee abnormality. Skin: No rashes, lesions or ulcers on visualized skin. Neuro: Alert and oriented. No focal neurological deficits. Psych: Judgement and insight appear fair. Mood euthymic & affect congruent. Behavior is appropriate.    Data Reviewed: I have personally reviewed  following labs and imaging studies  CBC: Recent Labs  Lab 07/02/19 1450 07/03/19 0309 07/04/19 0440  WBC 7.0 8.7 6.3  NEUTROABS  --  7.5 3.9  HGB 15.7 15.3 13.3  HCT 46.1 45.5 41.0  MCV 89.5 91.5 90.7  PLT 244 236 243   Basic Metabolic Panel: Recent Labs  Lab 07/02/19 1557 07/02/19 2037 07/03/19 0309 07/04/19 0440 07/05/19 0537  NA 135 133* 135 136 135  K 4.1 3.8 3.8 3.3* 3.6  CL 99 99 100 100 103  CO2 21* 18* 18* 26 24  GLUCOSE 207* 177* 193* 94 97  BUN 20 19 21 23 19   CREATININE 0.94 0.97 1.03 0.79 0.77  CALCIUM 9.2 8.4* 8.6* 8.6* 8.5*   GFR: Estimated Creatinine Clearance: 102.8 mL/min (by C-G formula based on SCr of 0.77 mg/dL). Liver Function Tests: Recent Labs  Lab 07/02/19 1748 07/02/19 2037 07/03/19 0309 07/04/19 0440 07/05/19 0537  AST 21 20 19 18 15   ALT 22 23 21 18 16   ALKPHOS 76 71 84 68 67  BILITOT 1.1 1.2 1.2 0.7 0.5  PROT 8.1 7.5 7.4 6.6 6.4*  ALBUMIN 3.3* 3.1* 3.1* 2.9* 2.8*   Recent Labs  Lab 07/02/19 1748  LIPASE 31   No results for input(s): AMMONIA in the last 168 hours. Coagulation Profile: No results for input(s): INR, PROTIME in the last 168 hours. Cardiac Enzymes: No results for input(s): CKTOTAL, CKMB, CKMBINDEX, TROPONINI in the last 168 hours. BNP (last 3 results) No results for input(s): PROBNP in the last 8760 hours. HbA1C: No results for input(s): HGBA1C in the last 72 hours. CBG: Recent Labs  Lab 07/04/19 0738 07/04/19 1129 07/04/19 1619 07/04/19 2153 07/05/19 0756  GLUCAP 120* 160* 88 114* 114*   Lipid Profile: Recent Labs    07/02/19 2037  TRIG 106   Thyroid Function Tests: No results for input(s): TSH, T4TOTAL, FREET4, T3FREE, THYROIDAB in the last 72 hours. Anemia Panel: Recent Labs    07/02/19 2037 07/03/19 0309  FERRITIN 219 259   Urine analysis:    Component Value Date/Time   COLORURINE YELLOW 07/03/2019 0154   APPEARANCEUR CLEAR 07/03/2019 0154   LABSPEC >1.046 (H) 07/03/2019 0154    PHURINE 5.0 07/03/2019 0154   GLUCOSEU >=500 (A) 07/03/2019 0154   HGBUR NEGATIVE 07/03/2019 0154   BILIRUBINUR NEGATIVE 07/03/2019 0154   KETONESUR 80 (A) 07/03/2019 0154   PROTEINUR NEGATIVE 07/03/2019 0154   NITRITE NEGATIVE 07/03/2019 0154   LEUKOCYTESUR NEGATIVE 07/03/2019 0154   Recent Results (from the past 240 hour(s))  Respiratory Panel by RT PCR (Flu A&B, Covid) - Nasopharyngeal Swab     Status: Abnormal   Collection Time: 07/02/19  6:39 PM   Specimen: Nasopharyngeal Swab  Result Value Ref Range Status   SARS Coronavirus 2 by RT PCR POSITIVE (A) NEGATIVE Final    Comment: RESULT CALLED TO, READ BACK BY AND VERIFIED WITH: D YAO MD 07/02/19 2031 JDW (NOTE) SARS-CoV-2 target nucleic acids are DETECTED. SARS-CoV-2 RNA is generally detectable in upper respiratory specimens  during the acute phase of infection. Positive results are indicative of the presence of the identified virus, but do not  rule out bacterial infection or co-infection with other pathogens not detected by the test. Clinical correlation with patient history and other diagnostic information is necessary to determine patient infection status. The expected result is Negative. Fact Sheet for Patients:  https://www.moore.com/ Fact Sheet for Healthcare Providers: https://www.young.biz/ This test is not yet approved or cleared by the [place] FDA and  has been authorized for detection and/or diagnosis of SARS-CoV-2 by FDA under an Emergency Use Authorization (EUA).  This EUA will remain in effect (meaning this test can be used) for the dur ation of  the COVID-19 declaration under Section 564(b)(1) of the Act, 21 U.S.C. section 360bbb-3(b)(1), unless the authorization is terminated or revoked sooner.    Influenza A by PCR NEGATIVE NEGATIVE Final   Influenza B by PCR NEGATIVE NEGATIVE Final    Comment: (NOTE) The Xpert Xpress SARS-CoV-2/FLU/RSV assay is intended as an aid  in  the diagnosis of influenza from Nasopharyngeal swab specimens and  should not be used as a sole basis for treatment. Nasal washings and  aspirates are unacceptable for Xpert Xpress SARS-CoV-2/FLU/RSV  testing. Fact Sheet for Patients: https://www.moore.com/ Fact Sheet for Healthcare Providers: https://www.young.biz/ This test is not yet approved or cleared by the [place] FDA and  has been authorized for detection and/or diagnosis of SARS-CoV-2 by  FDA under an Emergency Use Authorization (EUA). This EUA will remain  in effect (meaning this test can be used) for the duration of the  Covid-19 declaration under Section 564(b)(1) of the Act, 21  U.S.C. section 360bbb-3(b)(1), unless the authorization is  terminated or revoked. Performed at [facility], [address], [place], [place] [address]   Blood Culture (routine x 2)     Status: None (Preliminary result)   Collection Time: 07/02/19  9:24 PM   Specimen: BLOOD LEFT ARM  Result Value Ref Range Status   Specimen Description BLOOD LEFT ARM  Final   Special Requests   Final    BOTTLES DRAWN AEROBIC AND ANAEROBIC Blood Culture results may not be optimal due to an inadequate volume of blood received in culture bottles   Culture   Final    NO GROWTH 3 DAYS Performed at [facility], [address], [place], [place] [address]    Report Status PENDING  Incomplete  Blood Culture (routine x 2)     Status: None (Preliminary result)   Collection Time: 07/02/19  9:24 PM   Specimen: BLOOD LEFT ARM  Result Value Ref Range Status   Specimen Description BLOOD LEFT ARM  Final   Special Requests   Final    BOTTLES DRAWN AEROBIC AND ANAEROBIC Blood Culture results may not be optimal due to an inadequate volume of blood received in culture bottles   Culture   Final    NO GROWTH 3 DAYS Performed at [facility], [address], [place], [place] [address]    Report Status PENDING   Incomplete      Radiology Studies: No results found.  Scheduled Meds: . benazepril  10 mg Oral BID  . diltiazem  300 mg Oral Daily  . enoxaparin (LOVENOX) injection  1 mg/kg Subcutaneous Q12H  . influenza vaccine adjuvanted  0.5 mL Intramuscular Tomorrow-1000  . insulin aspart  0-9 Units Subcutaneous TID WC  . insulin aspart  20 Units Subcutaneous TID AC  . insulin glargine  40 Units Subcutaneous QHS  . pneumococcal 23 valent vaccine  0.5 mL Intramuscular Tomorrow-1000  . sodium chloride  flush  3 mL Intravenous Once  . sulfaSALAzine  1,500 mg Oral BID   Continuous Infusions: . remdesivir 100 mg in NS 100 mL       LOS: 2 days   Time spent: 25 minutes.  Tyrone Nine, MD [facility] [email or web address] 07/05/2019, 11:33 AM

## 2019-07-05 NOTE — Progress Notes (Signed)
Was unable to update patients family member by 2200

## 2019-07-05 NOTE — Progress Notes (Deleted)
2000 Patient has removed midline. Charge nurse notified.  2045 Dr. Dwana Curd notified and informed that a new midline would be placed in the morning.

## 2019-07-06 DIAGNOSIS — I82409 Acute embolism and thrombosis of unspecified deep veins of unspecified lower extremity: Secondary | ICD-10-CM | POA: Diagnosis not present

## 2019-07-06 DIAGNOSIS — E118 Type 2 diabetes mellitus with unspecified complications: Secondary | ICD-10-CM | POA: Diagnosis not present

## 2019-07-06 DIAGNOSIS — M6281 Muscle weakness (generalized): Secondary | ICD-10-CM | POA: Diagnosis not present

## 2019-07-06 DIAGNOSIS — J9600 Acute respiratory failure, unspecified whether with hypoxia or hypercapnia: Secondary | ICD-10-CM | POA: Diagnosis not present

## 2019-07-06 DIAGNOSIS — U071 COVID-19: Secondary | ICD-10-CM | POA: Diagnosis not present

## 2019-07-06 DIAGNOSIS — R52 Pain, unspecified: Secondary | ICD-10-CM | POA: Diagnosis not present

## 2019-07-06 DIAGNOSIS — I10 Essential (primary) hypertension: Secondary | ICD-10-CM | POA: Diagnosis not present

## 2019-07-06 DIAGNOSIS — M069 Rheumatoid arthritis, unspecified: Secondary | ICD-10-CM | POA: Diagnosis not present

## 2019-07-06 DIAGNOSIS — R279 Unspecified lack of coordination: Secondary | ICD-10-CM | POA: Diagnosis not present

## 2019-07-06 DIAGNOSIS — Z743 Need for continuous supervision: Secondary | ICD-10-CM | POA: Diagnosis not present

## 2019-07-06 DIAGNOSIS — R6889 Other general symptoms and signs: Secondary | ICD-10-CM | POA: Diagnosis not present

## 2019-07-06 DIAGNOSIS — G4700 Insomnia, unspecified: Secondary | ICD-10-CM | POA: Diagnosis not present

## 2019-07-06 DIAGNOSIS — D649 Anemia, unspecified: Secondary | ICD-10-CM | POA: Diagnosis not present

## 2019-07-06 DIAGNOSIS — J9601 Acute respiratory failure with hypoxia: Secondary | ICD-10-CM | POA: Diagnosis not present

## 2019-07-06 DIAGNOSIS — Z794 Long term (current) use of insulin: Secondary | ICD-10-CM | POA: Diagnosis not present

## 2019-07-06 DIAGNOSIS — I2510 Atherosclerotic heart disease of native coronary artery without angina pectoris: Secondary | ICD-10-CM | POA: Diagnosis not present

## 2019-07-06 DIAGNOSIS — I2583 Coronary atherosclerosis due to lipid rich plaque: Secondary | ICD-10-CM | POA: Diagnosis not present

## 2019-07-06 DIAGNOSIS — R262 Difficulty in walking, not elsewhere classified: Secondary | ICD-10-CM | POA: Diagnosis not present

## 2019-07-06 HISTORY — DX: COVID-19: U07.1

## 2019-07-06 LAB — GLUCOSE, CAPILLARY
Glucose-Capillary: 66 mg/dL — ABNORMAL LOW (ref 70–99)
Glucose-Capillary: 125 mg/dL — ABNORMAL HIGH (ref 70–99)
Glucose-Capillary: 146 mg/dL — ABNORMAL HIGH (ref 70–99)
Glucose-Capillary: 170 mg/dL — ABNORMAL HIGH (ref 70–99)

## 2019-07-06 MED ORDER — APIXABAN 5 MG PO TABS: ORAL_TABLET | ORAL | 0 refills | Status: DC

## 2019-07-06 MED ORDER — SODIUM CHLORIDE 0.9 % IV SOLN
100.0000 mg | Freq: Every day | INTRAVENOUS | Status: AC
  Administered 2019-07-06: 14:00:00 100 mg via INTRAVENOUS
  Filled 2019-07-06: qty 20

## 2019-07-06 MED ORDER — LANTUS SOLOSTAR 100 UNIT/ML ~~LOC~~ SOPN: 30.0000 [IU] | PEN_INJECTOR | Freq: Every day | SUBCUTANEOUS | 11 refills | Status: DC

## 2019-07-06 MED ORDER — TRAZODONE HCL 50 MG PO TABS: 50.0000 mg | ORAL_TABLET | Freq: Every evening | ORAL | Status: DC | PRN

## 2019-07-06 MED ORDER — BENAZEPRIL HCL 5 MG PO TABS
5.0000 mg | ORAL_TABLET | Freq: Every day | ORAL | Status: DC
  Administered 2019-07-06: 10:00:00 5 mg via ORAL
  Filled 2019-07-06 (×3): qty 1

## 2019-07-06 NOTE — Progress Notes (Signed)
Patient's niece Kennyth Arnold) updated. Questions answered.

## 2019-07-06 NOTE — Discharge Instructions (Signed)
COVID-19 COVID-19 is a respiratory infection that is caused by a virus called severe acute respiratory syndrome coronavirus 2 (SARS-CoV-2). The disease is also known as coronavirus disease or novel coronavirus. In some people, the virus may not cause any symptoms. In others, it may cause a serious infection. The infection can get worse quickly and can lead to complications, such as:  Pneumonia, or infection of the lungs.  Acute respiratory distress syndrome or ARDS. This is a condition in which fluid build-up in the lungs prevents the lungs from filling with air and passing oxygen into the blood.  Acute respiratory failure. This is a condition in which there is not enough oxygen passing from the lungs to the body or when carbon dioxide is not passing from the lungs out of the body.  Sepsis or septic shock. This is a serious bodily reaction to an infection.  Blood clotting problems.  Secondary infections due to bacteria or fungus.  Organ failure. This is when your body's organs stop working. The virus that causes COVID-19 is contagious. This means that it can spread from person to person through droplets from coughs and sneezes (respiratory secretions). What are the causes? This illness is caused by a virus. You may catch the virus by:  Breathing in droplets from an infected person. Droplets can be spread by a person breathing, speaking, singing, coughing, or sneezing.  Touching something, like a table or a doorknob, that was exposed to the virus (contaminated) and then touching your mouth, nose, or eyes. What increases the risk? Risk for infection You are more likely to be infected with this virus if you:  Are within 6 feet (2 meters) of a person with COVID-19.  Provide care for or live with a person who is infected with COVID-19.  Spend time in crowded indoor spaces or live in shared housing. Risk for serious illness You are more likely to become seriously ill from the virus if  you:  Are 50 years of age or older. The higher your age, the more you are at risk for serious illness.  Live in a nursing home or long-term care facility.  Have cancer.  Have a long-term (chronic) disease such as: ? Chronic lung disease, including chronic obstructive pulmonary disease or asthma. ? A long-term disease that lowers your body's ability to fight infection (immunocompromised). ? Heart disease, including heart failure, a condition in which the arteries that lead to the heart become narrow or blocked (coronary artery disease), a disease which makes the heart muscle thick, weak, or stiff (cardiomyopathy). ? Diabetes. ? Chronic kidney disease. ? Sickle cell disease, a condition in which red blood cells have an abnormal "sickle" shape. ? Liver disease.  Are obese. What are the signs or symptoms? Symptoms of this condition can range from mild to severe. Symptoms may appear any time from 2 to 14 days after being exposed to the virus. They include:  A fever or chills.  A cough.  Difficulty breathing.  Headaches, body aches, or muscle aches.  Runny or stuffy (congested) nose.  A sore throat.  New loss of taste or smell. Some people may also have stomach problems, such as nausea, vomiting, or diarrhea. Other people may not have any symptoms of COVID-19. How is this diagnosed? This condition may be diagnosed based on:  Your signs and symptoms, especially if: ? You live in an area with a COVID-19 outbreak. ? You recently traveled to or from an area where the virus is common. ? You   provide care for or live with a person who was diagnosed with COVID-19. ? You were exposed to a person who was diagnosed with COVID-19.  A physical exam.  Lab tests, which may include: ? Taking a sample of fluid from the back of your nose and throat (nasopharyngeal fluid), your nose, or your throat using a swab. ? A sample of mucus from your lungs (sputum). ? Blood tests.  Imaging tests,  which may include, X-rays, CT scan, or ultrasound. How is this treated? At present, there is no medicine to treat COVID-19. Medicines that treat other diseases are being used on a trial basis to see if they are effective against COVID-19. Your health care provider will talk with you about ways to treat your symptoms. For most people, the infection is mild and can be managed at home with rest, fluids, and over-the-counter medicines. Treatment for a serious infection usually takes places in a hospital intensive care unit (ICU). It may include one or more of the following treatments. These treatments are given until your symptoms improve.  Receiving fluids and medicines through an IV.  Supplemental oxygen. Extra oxygen is given through a tube in the nose, a face mask, or a hood.  Positioning you to lie on your stomach (prone position). This makes it easier for oxygen to get into the lungs.  Continuous positive airway pressure (CPAP) or bi-level positive airway pressure (BPAP) machine. This treatment uses mild air pressure to keep the airways open. A tube that is connected to a motor delivers oxygen to the body.  Ventilator. This treatment moves air into and out of the lungs by using a tube that is placed in your windpipe.  Tracheostomy. This is a procedure to create a hole in the neck so that a breathing tube can be inserted.  Extracorporeal membrane oxygenation (ECMO). This procedure gives the lungs a chance to recover by taking over the functions of the heart and lungs. It supplies oxygen to the body and removes carbon dioxide. Follow these instructions at home: Lifestyle  If you are sick, stay home except to get medical care. Your health care provider will tell you how long to stay home. Call your health care provider before you go for medical care.  Rest at home as told by your health care provider.  Do not use any products that contain nicotine or tobacco, such as cigarettes,  e-cigarettes, and chewing tobacco. If you need help quitting, ask your health care provider.  Return to your normal activities as told by your health care provider. Ask your health care provider what activities are safe for you. General instructions  Take over-the-counter and prescription medicines only as told by your health care provider.  Drink enough fluid to keep your urine pale yellow.  Keep all follow-up visits as told by your health care provider. This is important. How is this prevented?  There is no vaccine to help prevent COVID-19 infection. However, there are steps you can take to protect yourself and others from this virus. To protect yourself:   Do not travel to areas where COVID-19 is a risk. The areas where COVID-19 is reported change often. To identify high-risk areas and travel restrictions, check the CDC travel website: wwwnc.cdc.gov/travel/notices  If you live in, or must travel to, an area where COVID-19 is a risk, take precautions to avoid infection. ? Stay away from people who are sick. ? Wash your hands often with soap and water for 20 seconds. If soap and water   are not available, use an alcohol-based hand sanitizer. ? Avoid touching your mouth, face, eyes, or nose. ? Avoid going out in public, follow guidance from your state and local health authorities. ? If you must go out in public, wear a cloth face covering or face mask. Make sure your mask covers your nose and mouth. ? Avoid crowded indoor spaces. Stay at least 6 feet (2 meters) away from others. ? Disinfect objects and surfaces that are frequently touched every day. This may include:  Counters and tables.  Doorknobs and light switches.  Sinks and faucets.  Electronics, such as phones, remote controls, keyboards, computers, and tablets. To protect others: If you have symptoms of COVID-19, take steps to prevent the virus from spreading to others.  If you think you have a COVID-19 infection, contact  your health care provider right away. Tell your health care team that you think you may have a COVID-19 infection.  Stay home. Leave your house only to seek medical care. Do not use public transport.  Do not travel while you are sick.  Wash your hands often with soap and water for 20 seconds. If soap and water are not available, use alcohol-based hand sanitizer.  Stay away from other members of your household. Let healthy household members care for children and pets, if possible. If you have to care for children or pets, wash your hands often and wear a mask. If possible, stay in your own room, separate from others. Use a different bathroom.  Make sure that all people in your household wash their hands well and often.  Cough or sneeze into a tissue or your sleeve or elbow. Do not cough or sneeze into your hand or into the air.  Wear a cloth face covering or face mask. Make sure your mask covers your nose and mouth. Where to find more information  Centers for Disease Control and Prevention: www.cdc.gov/coronavirus/2019-ncov/index.html  World Health Organization: www.who.int/health-topics/coronavirus Contact a health care provider if:  You live in or have traveled to an area where COVID-19 is a risk and you have symptoms of the infection.  You have had contact with someone who has COVID-19 and you have symptoms of the infection. Get help right away if:  You have trouble breathing.  You have pain or pressure in your chest.  You have confusion.  You have bluish lips and fingernails.  You have difficulty waking from sleep.  You have symptoms that get worse. These symptoms may represent a serious problem that is an emergency. Do not wait to see if the symptoms will go away. Get medical help right away. Call your local emergency services (911 in the [place] Do not drive yourself to the hospital. Let the emergency medical personnel know if you think you have  COVID-19. Summary  COVID-19 is a respiratory infection that is caused by a virus. It is also known as coronavirus disease or novel coronavirus. It can cause serious infections, such as pneumonia, acute respiratory distress syndrome, acute respiratory failure, or sepsis.  The virus that causes COVID-19 is contagious. This means that it can spread from person to person through droplets from breathing, speaking, singing, coughing, or sneezing.  You are more likely to develop a serious illness if you are 50 years of age or older, have a weak immune system, live in a nursing home, or have chronic disease.  There is no medicine to treat COVID-19. Your health care provider will talk with you about ways to treat your symptoms.    Take steps to protect yourself and others from infection. Wash your hands often and disinfect objects and surfaces that are frequently touched every day. Stay away from people who are sick and wear a mask if you are sick. This information is not intended to replace advice given to you by your health care provider. Make sure you discuss any questions you have with your health care provider. Document Revised: 03/26/2019 Document Reviewed: 07/02/2018 Elsevier Patient Education  [place]. ----------------------------- Information on my medicine - ELIQUIS (apixaban)  This medication education was reviewed with me or my healthcare representative as part of my discharge preparation.  The pharmacist that spoke with me during my hospital stay was:  Donnamae Jude, [facility]  Why was Eliquis prescribed for you? Eliquis was prescribed to treat blood clots that may have been found in the veins of your legs (deep vein thrombosis) or in your lungs (pulmonary embolism) and to reduce the risk of them occurring again.  What do You need to know about Eliquis ? The starting dose is 10 mg (two 5 mg tablets) taken TWICE daily for the FIRST SEVEN (7) DAYS, then on (enter date)  07/14/19  the  dose is reduced to ONE 5 mg tablet taken TWICE daily.  Eliquis may be taken with or without food.   Try to take the dose about the same time in the morning and in the evening. If you have difficulty swallowing the tablet whole please discuss with your pharmacist how to take the medication safely.  Take Eliquis exactly as prescribed and DO NOT stop taking Eliquis without talking to the doctor who prescribed the medication.  Stopping may increase your risk of developing a new blood clot.  Refill your prescription before you run out.  After discharge, you should have regular check-up appointments with your healthcare provider that is prescribing your Eliquis.    What do you do if you miss a dose? If a dose of ELIQUIS is not taken at the scheduled time, take it as soon as possible on the same day and twice-daily administration should be resumed. The dose should not be doubled to make up for a missed dose.  Important Safety Information A possible side effect of Eliquis is bleeding. You should call your healthcare provider right away if you experience any of the following: ? Bleeding from an injury or your nose that does not stop. ? Unusual colored urine (red or dark brown) or unusual colored stools (red or black). ? Unusual bruising for unknown reasons. ? A serious fall or if you hit your head (even if there is no bleeding).  Some medicines may interact with Eliquis and might increase your risk of bleeding or clotting while on Eliquis. To help avoid this, consult your healthcare provider or pharmacist prior to using any new prescription or non-prescription medications, including herbals, vitamins, non-steroidal anti-inflammatory drugs (NSAIDs) and supplements.  This website has more information on Eliquis (apixaban): http://www.eliquis.com/eliquis/home .-----------------------------------------------------. Deep Vein Thrombosis    Deep vein thrombosis (DVT) is a condition in which a  blood clot forms in a deep vein, such as a lower leg, thigh, or arm vein. A clot is blood that has thickened into a gel or solid. This condition is dangerous. It can lead to serious and even life-threatening complications if the clot travels to the lungs and causes a blockage (pulmonary embolism). It can also damage veins in the leg. This can result in leg pain, swelling, discoloration, and sores (post-thrombotic syndrome).  What are the causes? This condition may be caused by:  A slowdown of blood flow.  Damage to a vein.  A condition that causes blood to clot more easily, such as an inherited clotting disorder.   What increases the risk? The following factors may make you more likely to develop this condition: 1. Being overweight. 2. Being older, especially over age 43. 3. Sitting or lying down for more than four hours. 4. Being in the hospital. 5. Lack of physical activity (sedentary lifestyle). 6. Pregnancy, being in childbirth, or having recently given birth. 7. Taking medicines that contain estrogen, such as medicines to prevent pregnancy. 8. Smoking. 9. A history of any of the following: ? Blood clots or a blood clotting disease. ? Peripheral vascular disease. ? Inflammatory bowel disease. ? Cancer. ? Heart disease. ? Genetic conditions that affect how your blood clots, such as Factor V Leiden mutation. ? Neurological diseases that affect your legs (leg paresis). ? A recent injury, such as a car accident. ? Major or lengthy surgery. ? A central line placed inside a large vein.  What are the signs or symptoms? Symptoms of this condition include:  Swelling, pain, or tenderness in an arm or leg.  Warmth, redness, or discoloration in an arm or leg. If the clot is in your leg, symptoms may be more noticeable or worse when you stand or walk. Some people may not develop any symptoms.  How is this diagnosed? This condition is diagnosed with: 1. A medical history and  physical exam. 2. Tests, such as: ? Blood tests. These are done to check how well your blood clots. ? Ultrasound. This is done to check for clots. ? Venogram. For this test, contrast dye is injected into a vein and X-rays are taken to check for any clots  How is this treated? Treatment for this condition depends on:  The cause of your DVT.  Your risk for bleeding or developing more clots.  Any other medical conditions that you have. Treatment may include: 1. Taking a blood thinner (anticoagulant). This type of medicine prevents clots from forming. It may be taken by mouth, injected under the skin, or injected through an IV (catheter). 2. Injecting clot-dissolving medicines into the affected vein (catheter-directed thrombolysis). 3. Having surgery. Surgery may be done to: ? Remove the clot. ? Place a filter in a large vein to catch blood clots before they reach the lungs. Some treatments may be continued for up to six months.  Follow these instructions at home: If you are taking blood thinners: 1. Take the medicine exactly as told by your health care provider. Some blood thinners need to be taken at the same time every day. Do not skip a dose. 2. Talk with your health care provider before you take any medicines that contain aspirin or NSAIDs. These medicines increase your risk for dangerous bleeding. 3. Ask your health care provider about foods and drugs that could change the way the medicine works (may interact). Avoid those things if your health care provider tells you to do so. 4. Blood thinners can cause easy bruising and may make it difficult to stop bleeding. Because of this: ? Be very careful when using knives, scissors, or other sharp objects. ? Use an electric razor instead of a blade. ? Avoid activities that could cause injury or bruising, and follow instructions about how to prevent falls. 5. Wear a medical alert bracelet or carry a card that lists what medicines you  take.  General instructions  Take over-the-counter and prescription medicines only as told by your health care provider.  Return to your normal activities as told by your health care provider. Ask your health care provider what activities are safe for you.  Wear compression stockings if recommended by your health care provider.  Keep all follow-up visits as told by your health care provider. This is important.  How is this prevented? To lower your risk of developing this condition again: 1. For 30 or more minutes every day, do an activity that: ? Involves moving your arms and legs. ? Increases your heart rate. 2. When traveling for longer than four hours: ? Exercise your arms and legs every hour. ? Drink plenty of water. ? Avoid drinking alcohol. 3. Avoid sitting or lying for a long time without moving your legs. 4. If you have surgery or you are hospitalized, ask about ways to prevent blood clots. These may include taking frequent walks or using anticoagulants. 5. Stay at a healthy weight. 6. If you are a woman who is older than age 24, avoid unnecessary use of medicines that contain estrogen, such as some birth control pills. 7. Do not use any products that contain nicotine or tobacco, such as cigarettes and e-cigarettes. This is especially important if you take estrogen medicines. If you need help quitting, ask your health care provider.  Contact a health care provider if:  You miss a dose of your blood thinner.  Your menstrual period is heavier than usual.  You have unusual bruising.  Get help right away if: 1. You have: ? New or increased pain, swelling, or redness in an arm or leg. ? Numbness or tingling in an arm or leg. ? Shortness of breath. ? Chest pain. ? A rapid or irregular heartbeat. ? A severe headache or confusion. ? A cut that will not stop bleeding. 2. There is blood in your vomit, stool, or urine. 3. You have a serious fall or accident, or you hit your  head. 4. You feel light-headed or dizzy. 5. You cough up blood.  These symptoms may represent a serious problem that is an emergency. Do not wait to see if the symptoms will go away. Get medical help right away. Call your local emergency services (911 in the [place] Do not drive yourself to the hospital. Summary  Deep vein thrombosis (DVT) is a condition in which a blood clot forms in a deep vein, such as a lower leg, thigh, or arm vein.  Symptoms can include swelling, warmth, pain, and redness in your leg or arm.  This condition may be treated with a blood thinner (anticoagulant medicine), medicine that is injected to dissolve blood clots,compression stockings, or surgery.  If you are prescribed blood thinners, take them exactly as told. This information is not intended to replace advice given to you by your health care provider. Make sure you discuss any questions you have with your health care provider. Document Revised: 05/09/2017 Document Reviewed: 10/25/2016 Elsevier Patient Education  2020 [employer].

## 2019-07-06 NOTE — TOC Transition Note (Signed)
Transition of Care [facility]) - CM/SW Discharge Note   Patient Details  Name: CHOICE KLEINSASSER MRN: [id number] Date of Birth: 1946/02/20  [facility] CM/SW Contact:  Elliot Gault, LCSW Phone Number: 07/06/2019, 2:44 PM   Clinical Narrative:     Pt stable for dc. Discussed bed offers and pt accepts bed at [facility]. Updated pt's niece at pt request. DC clinical sent electronically. Rn to call report. EMS forms printed to the floor. Called [facility].  There are no other TOC needs for dc.  Final next level of care: Skilled Nursing Facility Barriers to Discharge: Barriers Resolved   Patient Goals and CMS Choice Patient states their goals for this hospitalization and ongoing recovery are:: agreeable to SNF but wants to go home soon CMS Medicare.gov Compare Post Acute Care list provided to:: Patient Choice offered to / list presented to : Patient  Discharge Placement              Patient chooses bed at: [facility] Patient to be transferred to facility by: [facility] Name of family member notified: Stacy Patient and family notified of of transfer: 07/06/19  Discharge Plan and Services   Discharge Planning Services: CM Consult Post Acute Care Choice: Nursing Home                               Social Determinants of Health (SDOH) Interventions     Readmission Risk Interventions No flowsheet data found.

## 2019-07-06 NOTE — Care Management Important Message (Signed)
Important Message  Patient Details  Name: William Hunter MRN: [id number] Date of Birth: Mar 09, 1946   Medicare Important Message Given:  Yes - Important Message mailed due to current National Emergency  Verbal consent obtained due to current National Emergency  Relationship to patient: Spouse/Significant Other Contact Name: Shaka Cardin Call Date: 07/06/19  Time: 1322 Phone: [phone] Outcome: Missing or Invalid Number Important Message mailed to: Patient address on file    Orson Aloe 07/06/2019, 1:22 PM

## 2019-07-06 NOTE — Plan of Care (Signed)
  Problem: Education: Goal: Knowledge of risk factors and measures for prevention of condition will improve Outcome: Progressing   Problem: Coping: Goal: Psychosocial and spiritual needs will be supported Outcome: Progressing   Problem: Respiratory: Goal: Will maintain a patent airway Outcome: Progressing Goal: Complications related to the disease process, condition or treatment will be avoided or minimized Outcome: Progressing   Problem: Education: Goal: Ability to describe self-care measures that may prevent or decrease complications (Diabetes Survival Skills Education) will improve Outcome: Progressing Goal: Individualized Educational Video(s) Outcome: Progressing   Problem: Coping: Goal: Ability to adjust to condition or change in health will improve Outcome: Progressing   Problem: Fluid Volume: Goal: Ability to maintain a balanced intake and output will improve Outcome: Progressing   Problem: Health Behavior/Discharge Planning: Goal: Ability to identify and utilize available resources and services will improve Outcome: Progressing Goal: Ability to manage health-related needs will improve Outcome: Progressing   Problem: Metabolic: Goal: Ability to maintain appropriate glucose levels will improve Outcome: Progressing   Problem: Nutritional: Goal: Maintenance of adequate nutrition will improve Outcome: Progressing Goal: Progress toward achieving an optimal weight will improve Outcome: Progressing   Problem: Skin Integrity: Goal: Risk for impaired skin integrity will decrease Outcome: Progressing   Problem: Tissue Perfusion: Goal: Adequacy of tissue perfusion will improve Outcome: Progressing   

## 2019-07-06 NOTE — Discharge Summary (Signed)
Physician Discharge Summary  HA PLACERES EEF:[id number] DOB: 1945-09-22 DOA: 07/02/2019  PCP: Irena Reichmann, DO  Admit date: 07/02/2019 Discharge date: 07/06/2019  Admitted From: Home Disposition: SNF   Recommendations for Outpatient Follow-up:  1. Follow up with PCP in 1-2 weeks 2. Please obtain BMP/CBC in one week 3. Continue monitoring CBGs, lantus decreased 40u > 30u due to fasting hypoglycemia 4. Continue monitoring for bleeding. Started eliquis for acute DVT after getting lovenox as inpatient 5. Has completed remdesivir, and has allergy to steroids, so no further treatment for covid-19 recommended. Isolation recommended per CDC guidelines for 21 days from the date of positive testing (see below)  Home Health: N/A Equipment/Devices: Per SNF, no O2 Discharge Condition: Stable CODE STATUS: Full Diet recommendation: Heart healthy, carb-modified  Brief/Interim Summary: Nilsa Nutting a73 y.o.malewith a history of RA on humira and sulfasalazine, IDT2DM, and HTN who presented to the ED with weakness after a fall at home in the setting of about 2 weeks of fatigue, dyspnea, N/V/D. On arrival in the ED by EMS he was mildly hypoxic. SARS-CoV-2 testing was positive, so remdesivir was provided and the patient was admitted to [facility]. Steroids withheld due to allergy to prednisone. Due to a filling defect noted in the left femoral vein on CT abdomen/pelvis for which venous doppler was ordered and confirmed LLE DVT. Therapeutic anticoagulation was started. Therapy has recommended SNF prior to return home for rehabilitation to regain PLOF. He continues to require assistance and is discharged after the final dose of remdesivir.  Discharge Diagnoses:  Principal Problem:   Acute respiratory failure due to COVID-19 [facility]) Active Problems:   Coronary artery disease   Hypertension   Rheumatoid arthritis ([name]  Acute hypoxemic respiratory failure due to covid-19 pneumonia: SARS-CoV-2 PCR positive  on1/22.CRP 8.5, PCT negative. GGOs noted with lower lobe bibasilar peripheral predominant distribution on CTA chest, no PE. - Has significant allergy to steroids in the past, avoiding decadron. Tocilizumab considered, though it is felt to be contraindicated by clinical stability and use of immunomodulators. CRP has trended downward. He was quickly weaned from supplemental oxygen. -Completedremdesivir x5 days 1/22- 1/26 - Continue airborne, contact isolation for 21 days from positive testing.  Acute DVT LLE: Based on preliminary interpretation. D-dimer 0.63, no PE on CTA chest, and is asymptomatic from DVT perspective.  - Convert therapeutic dose lovenox to eliquis at discharge. CBC wnl.  Poor urine output: Possibly spurious, resolved without an AKI, etc.  Fall at home:  - CT head, c-spine, L-spine without fracture/dislocation - Requires PT/OT  HTN: BP at goal. - Continue benazepril, diltiazem  IDT2DM: CBG control at inpatient goal though did become hypoglycemic, recommend decreasing long acting insulin.  RA:  - Took humira 07/01/2019, continue sulfasalazine. - Give ibuprofen prn as NSAID administration has not been proven to be detrimental (risk < benefit for mobility)  Hypokalemia: Resolved. - Supplement and monitor  Discharge Instructions  Allergies as of 07/06/2019      Reactions   Codeine Swelling   Penicillins Hives, Swelling   Has patient had a PCN reaction causing immediate rash, facial/tongue/throat swelling, SOB or lightheadedness with hypotension: Yes Has patient had a PCN reaction causing severe rash involving mucus membranes or skin necrosis: No Has patient had a PCN reaction that required hospitalization No Has patient had a PCN reaction occurring within the last 10 years: No If all of the above answers are "NO", then may proceed with Cephalosporin use.   Prednisone Hives   Wool Alcohol [lanolin]  Hives      Medication List    TAKE these medications    apixaban 5 MG Tabs tablet Commonly known as: Eliquis Take 2 tablets (10mg ) twice daily for 7 days, then 1 tablet (5mg ) twice daily   benazepril 10 MG tablet Commonly known as: LOTENSIN Take 10 mg by mouth 2 (two) times daily.   diltiazem 300 MG 24 hr capsule Commonly known as: TIAZAC Take 1 capsule (300 mg total) by mouth daily.   Humira 40 MG/0.4ML Pskt Generic drug: Adalimumab Inject 40 mg into the skin every 14 (fourteen) days. Every other Thursday   ibuprofen 200 MG tablet Commonly known as: ADVIL Take 400 mg by mouth daily.   insulin aspart 100 UNIT/ML injection Commonly known as: novoLOG Inject 20 Units into the skin 3 (three) times daily before meals.   Jardiance 10 MG Tabs tablet Generic drug: empagliflozin Take 10 mg by mouth daily.   Lantus SoloStar 100 UNIT/ML Solostar Pen Generic drug: Insulin Glargine Inject 30 Units into the skin at bedtime. What changed: how much to take   loratadine 10 MG tablet Commonly known as: CLARITIN Take 10 mg by mouth daily.   metFORMIN 500 MG 24 hr tablet Commonly known as: GLUCOPHAGE-XR Take 1,000 mg by mouth 2 (two) times daily.   OVER THE COUNTER MEDICATION Apply 1 application topically daily. Hemp cream - for pain   sulfaSALAzine 500 MG tablet Commonly known as: AZULFIDINE Take 1,500 mg by mouth 2 (two) times daily.   traZODone 50 MG tablet Commonly known as: DESYREL Take 1 tablet (50 mg total) by mouth at bedtime as needed for sleep.       Contact information for follow-up providers    , DO Follow up.   Specialty: Family Medicine Contact information: [address] [place] [address] [place] [phone]        [address], MD .   Specialty: Cardiology Contact information: [phone] [address] [place] [address] [place] [phone]            Contact information for after-discharge care    Destination    [facility] Preferred SNF .   Service:  Skilled Nursing Contact information: [address] [place] [address] [place] [phone]                 Allergies  Allergen Reactions  . Codeine Swelling  . Penicillins Hives and Swelling    Has patient had a PCN reaction causing immediate rash, facial/tongue/throat swelling, SOB or lightheadedness with hypotension: Yes Has patient had a PCN reaction causing severe rash involving mucus membranes or skin necrosis: No Has patient had a PCN reaction that required hospitalization No Has patient had a PCN reaction occurring within the last 10 years: No If all of the above answers are "NO", then may proceed with Cephalosporin use.   . Prednisone Hives  . Wool Alcohol [Lanolin] Hives    Consultations:  None  Procedures/Studies: DG Chest 2 View  Result Date: 07/02/2019 CLINICAL DATA:  Weakness for 2 weeks. The patient suffered a fall today. EXAM: CHEST - 2 VIEW COMPARISON:  PA and lateral chest 09/16/2016. FINDINGS: Lung volumes are lower than on the comparison examination. Mild, streaky opacities are seen in the left lung base. No pneumothorax or pleural effusion. Heart size is normal. No acute or focal bony abnormality. IMPRESSION: Mild, streaky left basilar opacities are likely due to atelectasis. No acute disease. Electronically Signed   By: 07/04/2019  Dalessio M.D.   On: 07/02/2019 15:05   CT Head Wo Contrast  Result Date: 07/02/2019 CLINICAL DATA:  Head trauma, fell earlier today, weakness, cough, history coronary artery disease post MI, hypertension, type II diabetes mellitus EXAM: CT HEAD WITHOUT CONTRAST CT CERVICAL SPINE WITHOUT CONTRAST TECHNIQUE: Multidetector CT imaging of the head and cervical spine was performed following the standard protocol without intravenous contrast. Multiplanar CT image reconstructions of the cervical spine were also generated. COMPARISON:  CT head 09/16/2016 FINDINGS: CT HEAD FINDINGS Brain: Generalized atrophy with ex vacuo dilatation of  the ventricular system. Prominent cisterna magna. No midline shift or mass effect. Small vessel chronic ischemic changes of deep cerebral white matter. No intracranial hemorrhage, mass lesion, or evidence of acute infarction. No extra-axial fluid collections. Vascular: No hyperdense vessels. Atherosclerotic calcifications of internal carotid arteries and LEFT vertebral artery at skull base Skull: Intact Sinuses/Orbits: Clear Other: N/A CT CERVICAL SPINE FINDINGS Alignment: Minimal anterolisthesis at C4-C5. Remaining alignments normal Skull base and vertebrae: Osseous demineralization. Significant degenerative changes at articulation of odontoid process with anterior arch of C1. Skull base intact. Multilevel disc space narrowing and endplate spur formation. Multilevel facet degenerative changes. Encroachment upon LEFT C4-C5 foramen by uncovertebral and facet hypertrophy. No fracture, additional subluxation or bone destruction. Soft tissues and spinal canal: Prevertebral soft tissues normal thickness. Disc levels:  No additional abnormalities Upper chest: Lung apices clear Other: N/A IMPRESSION: Atrophy with small vessel chronic ischemic changes of deep cerebral white matter. No acute intracranial abnormalities. Multilevel degenerative disc and facet disease changes of the cervical spine. No acute cervical spine abnormalities. Electronically Signed   By: Ulyses Southward M.D.   On: 07/02/2019 21:03   CT Angio Chest PE W and/or Wo Contrast  Result Date: 07/02/2019 CLINICAL DATA:  Shortness of breath. Cough and weakness. Fall earlier today. EXAM: CT ANGIOGRAPHY CHEST WITH CONTRAST TECHNIQUE: Multidetector CT imaging of the chest was performed using the standard protocol during bolus administration of intravenous contrast. Multiplanar CT image reconstructions and MIPs were obtained to evaluate the vascular anatomy. CONTRAST:  OMNIPAQUE IOHEXOL 300 MG/ML  SOLN COMPARISON:  Radiograph earlier this day FINDINGS:  Cardiovascular: There are no filling defects within the pulmonary arteries to suggest pulmonary embolus. Mild aortic atherosclerosis and tortuosity. No aortic dissection. Heart is normal in size. No pericardial effusion. Mediastinum/Nodes: No enlarged mediastinal or hilar lymph nodes. No thyroid nodule. No esophageal wall thickening. Tiny hiatal hernia. Lungs/Pleura: Heterogeneous ground-glass opacities in the peripheral predominant distribution. Findings most prominent in the lower lobes, however there also patchy opacities in the upper lobes and right middle lobe. No septal thickening or findings of pulmonary edema. No pleural fluid. Trachea and mainstem bronchi are patent. Upper Abdomen: Assessed on concurrent abdominal CT, reported separately. Cholecystectomy. Musculoskeletal: There are no acute or suspicious osseous abnormalities. Degenerative change in the thoracic spine. No acute fracture. No chest wall contusion. Review of the MIP images confirms the above findings. IMPRESSION: 1. No pulmonary embolus. 2. Heterogeneous ground-glass opacities in the peripheral predominant distribution, most prominent in the lower lobes. Findings are suspicious for COVID pneumonia. Other inflammatory or other atypical infectious etiologies are also considered. Aortic Atherosclerosis (ICD10-I70.0). Electronically Signed   By: Narda Rutherford M.D.   On: 07/02/2019 21:08   CT Cervical Spine Wo Contrast  Result Date: 07/02/2019 CLINICAL DATA:  Head trauma, fell earlier today, weakness, cough, history coronary artery disease post MI, hypertension, type II diabetes mellitus EXAM: CT HEAD WITHOUT CONTRAST CT CERVICAL SPINE WITHOUT CONTRAST TECHNIQUE:  Multidetector CT imaging of the head and cervical spine was performed following the standard protocol without intravenous contrast. Multiplanar CT image reconstructions of the cervical spine were also generated. COMPARISON:  CT head 09/16/2016 FINDINGS: CT HEAD FINDINGS Brain:  Generalized atrophy with ex vacuo dilatation of the ventricular system. Prominent cisterna magna. No midline shift or mass effect. Small vessel chronic ischemic changes of deep cerebral white matter. No intracranial hemorrhage, mass lesion, or evidence of acute infarction. No extra-axial fluid collections. Vascular: No hyperdense vessels. Atherosclerotic calcifications of internal carotid arteries and LEFT vertebral artery at skull base Skull: Intact Sinuses/Orbits: Clear Other: N/A CT CERVICAL SPINE FINDINGS Alignment: Minimal anterolisthesis at C4-C5. Remaining alignments normal Skull base and vertebrae: Osseous demineralization. Significant degenerative changes at articulation of odontoid process with anterior arch of C1. Skull base intact. Multilevel disc space narrowing and endplate spur formation. Multilevel facet degenerative changes. Encroachment upon LEFT C4-C5 foramen by uncovertebral and facet hypertrophy. No fracture, additional subluxation or bone destruction. Soft tissues and spinal canal: Prevertebral soft tissues normal thickness. Disc levels:  No additional abnormalities Upper chest: Lung apices clear Other: N/A IMPRESSION: Atrophy with small vessel chronic ischemic changes of deep cerebral white matter. No acute intracranial abnormalities. Multilevel degenerative disc and facet disease changes of the cervical spine. No acute cervical spine abnormalities. Electronically Signed   By: Lavonia Dana M.D.   On: 07/02/2019 21:03   CT ABDOMEN PELVIS W CONTRAST  Result Date: 07/02/2019 CLINICAL DATA:  Abdominal trauma. Weakness and cough. Pain status post fall. EXAM: CT ABDOMEN AND PELVIS WITH CONTRAST CT LUMBAR SPINE WITHOUT CONTRAST TECHNIQUE: Multidetector CT imaging of the abdomen and pelvis was performed using the standard protocol following bolus administration of intravenous contrast. Multiplanar CT images of the lumbar spine was reconstructed from contemporary CT of the Abdomen, and Pelvis  CONTRAST:  143mL OMNIPAQUE IOHEXOL 300 MG/ML  SOLN COMPARISON:  CT of the pelvis dated 09/16/2016. FINDINGS: Lower chest: There are peripheral ground-glass airspace opacities noted at the lung bases.Coronary artery calcifications are noted. The heart size appears relatively normal. Hepatobiliary: The liver is normal. Status post cholecystectomy.There is no biliary ductal dilation. Pancreas: Normal contours without ductal dilatation. No peripancreatic fluid collection. Spleen: No splenic laceration or hematoma. Adrenals/Urinary Tract: --Adrenal glands: No adrenal hemorrhage. --Right kidney/ureter: No hydronephrosis or perinephric hematoma. --Left kidney/ureter: No hydronephrosis or perinephric hematoma. --Urinary bladder: Unremarkable. Stomach/Bowel: --Stomach/Duodenum: No hiatal hernia or other gastric abnormality. Normal duodenal course and caliber. --Small bowel: No dilatation or inflammation. --Colon: No focal abnormality. --Appendix: Not visualized. No right lower quadrant inflammation or free fluid. Vascular/Lymphatic: Atherosclerotic calcification is present within the non-aneurysmal abdominal aorta, without hemodynamically significant stenosis. There is a questionable filling defect in the partially visualized proximal left superficial femoral vein. --No retroperitoneal lymphadenopathy. --No mesenteric lymphadenopathy. --No pelvic or inguinal lymphadenopathy. Reproductive: Unremarkable Other: No ascites or free air. The abdominal wall is normal. Musculoskeletal. There are degenerative changes of both hips. There is no acute displaced fracture involving the lumbar spine. Mild-to-moderate multilevel degenerative changes are noted of the lumbar spine. IMPRESSION: 1. No acute intra-abdominal abnormality detected. 2. No acute lumbar spine fracture. 3. Incidentally noted peripheral ground-glass airspace opacities at the lung bases. Findings are concerning for an atypical infectious process in the appropriate  clinical setting. 4. Questionable filling defect in the partially visualized left superficial femoral vein. Follow-up with a lower extremity venous ultrasound is recommended to help exclude a lower extremity DVT. Aortic Atherosclerosis (ICD10-I70.0). Electronically Signed   By: Constance Holster  M.D.   On: 07/02/2019 21:08   CT L-SPINE NO CHARGE  Result Date: 07/02/2019 CLINICAL DATA:  Abdominal trauma. Weakness and cough. Pain status post fall. EXAM: CT ABDOMEN AND PELVIS WITH CONTRAST CT LUMBAR SPINE WITHOUT CONTRAST TECHNIQUE: Multidetector CT imaging of the abdomen and pelvis was performed using the standard protocol following bolus administration of intravenous contrast. Multiplanar CT images of the lumbar spine was reconstructed from contemporary CT of the Abdomen, and Pelvis CONTRAST:  OMNIPAQUE IOHEXOL 300 MG/ML  SOLN COMPARISON:  CT of the pelvis dated 09/16/2016. FINDINGS: Lower chest: There are peripheral ground-glass airspace opacities noted at the lung bases.Coronary artery calcifications are noted. The heart size appears relatively normal. Hepatobiliary: The liver is normal. Status post cholecystectomy.There is no biliary ductal dilation. Pancreas: Normal contours without ductal dilatation. No peripancreatic fluid collection. Spleen: No splenic laceration or hematoma. Adrenals/Urinary Tract: --Adrenal glands: No adrenal hemorrhage. --Right kidney/ureter: No hydronephrosis or perinephric hematoma. --Left kidney/ureter: No hydronephrosis or perinephric hematoma. --Urinary bladder: Unremarkable. Stomach/Bowel: --Stomach/Duodenum: No hiatal hernia or other gastric abnormality. Normal duodenal course and caliber. --Small bowel: No dilatation or inflammation. --Colon: No focal abnormality. --Appendix: Not visualized. No right lower quadrant inflammation or free fluid. Vascular/Lymphatic: Atherosclerotic calcification is present within the non-aneurysmal abdominal aorta, without hemodynamically  significant stenosis. There is a questionable filling defect in the partially visualized proximal left superficial femoral vein. --No retroperitoneal lymphadenopathy. --No mesenteric lymphadenopathy. --No pelvic or inguinal lymphadenopathy. Reproductive: Unremarkable Other: No ascites or free air. The abdominal wall is normal. Musculoskeletal. There are degenerative changes of both hips. There is no acute displaced fracture involving the lumbar spine. Mild-to-moderate multilevel degenerative changes are noted of the lumbar spine. IMPRESSION: 1. No acute intra-abdominal abnormality detected. 2. No acute lumbar spine fracture. 3. Incidentally noted peripheral ground-glass airspace opacities at the lung bases. Findings are concerning for an atypical infectious process in the appropriate clinical setting. 4. Questionable filling defect in the partially visualized left superficial femoral vein. Follow-up with a lower extremity venous ultrasound is recommended to help exclude a lower extremity DVT. Aortic Atherosclerosis (ICD10-I70.0). Electronically Signed   By: Katherine Mantle M.D.   On: 07/02/2019 21:08   VAS [place] LOWER EXTREMITY VENOUS (DVT) ([facility] and [facility] 7a-7p)  Result Date: 07/05/2019  Lower Venous Study Indications: CT Filling defect.  Risk Factors: COVID 19 positive. Comparison Study: No prior studies. Performing Technologist: Chanda Busing RVT  Examination Guidelines: A complete evaluation includes B-mode imaging, spectral Doppler, color Doppler, and power Doppler as needed of all accessible portions of each vessel. Bilateral testing is considered an integral part of a complete examination. Limited examinations for reoccurring indications may be performed as noted.  +-----+---------------+---------+-----------+----------+--------------+ RIGHTCompressibilityPhasicitySpontaneityPropertiesThrombus Aging +-----+---------------+---------+-----------+----------+--------------+ CFV  Full           Yes       Yes                                 +-----+---------------+---------+-----------+----------+--------------+   +---------+---------------+---------+-----------+----------+--------------+ LEFT     CompressibilityPhasicitySpontaneityPropertiesThrombus Aging +---------+---------------+---------+-----------+----------+--------------+ CFV      Full           Yes      Yes                                 +---------+---------------+---------+-----------+----------+--------------+ SFJ      Full                                                        +---------+---------------+---------+-----------+----------+--------------+  FV Prox  Full                                                        +---------+---------------+---------+-----------+----------+--------------+ FV Mid   Full                                                        +---------+---------------+---------+-----------+----------+--------------+ FV DistalFull                                                        +---------+---------------+---------+-----------+----------+--------------+ PFV      Partial        Yes      Yes                  Acute          +---------+---------------+---------+-----------+----------+--------------+ POP      Full           Yes      Yes                                 +---------+---------------+---------+-----------+----------+--------------+ PTV      Full                                                        +---------+---------------+---------+-----------+----------+--------------+ PERO     Full                                                        +---------+---------------+---------+-----------+----------+--------------+     Summary: Right: No evidence of common femoral vein obstruction. Left: Findings consistent with acute deep vein thrombosis involving the left proximal profunda vein. No cystic structure found in the popliteal fossa.  *See table(s) above  for measurements and observations. Electronically signed by Sherald Hess MD on 07/05/2019 at 4:32:35 PM.    Final    Subjective: Had a low blood sugar of 66 in the middle of the night associated with lightheadedness and emesis. He denies any nausea since, CBGs coming up, eating without any issues now. No dyspnea, choking, chest pain, or other complaints. Feels ready to go to rehabilitation.  Discharge Exam: Vitals:   07/06/19 0351 07/06/19 0706  BP: 124/67 136/87  Pulse: 71 93  Resp: 18 16  Temp: 98.6 F (37 C) 97.8 F (36.6 C)  SpO2: 93% 93%   General: Pt is alert, awake, not in acute distress Cardiovascular: RRR, S1/S2 +, no rubs, no gallops Respiratory: CTA bilaterally, no wheezing, no rhonchi Abdominal: Soft, NT, ND, bowel sounds + Extremities: No edema, no cyanosis  Labs: BNP (last 3 results) No results  for input(s): BNP in the last 8760 hours. Basic Metabolic Panel: Recent Labs  Lab 07/02/19 1557 07/02/19 2037 07/03/19 0309 07/04/19 0440 07/05/19 0537  NA 135 133* 135 136 135  K 4.1 3.8 3.8 3.3* 3.6  CL 99 99 100 100 103  CO2 21* 18* 18* 26 24  GLUCOSE 207* 177* 193* 94 97  BUN CREATININE 0.94 0.97 1.03 0.79 0.77  CALCIUM 9.2 8.4* 8.6* 8.6* 8.5*   Liver Function Tests: Recent Labs  Lab 07/02/19 1748 07/02/19 2037 07/03/19 0309 07/04/19 0440 07/05/19 0537  AST ALT ALKPHOS 76 71 84 68 67  BILITOT 1.1 1.2 1.2 0.7 0.5  PROT 8.1 7.5 7.4 6.6 6.4*  ALBUMIN 3.3* 3.1* 3.1* 2.9* 2.8*   Recent Labs  Lab 07/02/19 1748  LIPASE 31   No results for input(s): AMMONIA in the last 168 hours. CBC: Recent Labs  Lab 07/02/19 1450 07/03/19 0309 07/04/19 0440  WBC 7.0 8.7 6.3  NEUTROABS  --  7.5 3.9  HGB 15.7 15.3 13.3  HCT 46.1 45.5 41.0  MCV 89.5 91.5 90.7  PLT 244 236 243   Cardiac Enzymes: No results for input(s): CKTOTAL, CKMB, CKMBINDEX, TROPONINI in the last 168 hours. BNP: Invalid input(s):  POCBNP CBG: Recent Labs  Lab 07/05/19 1537 07/05/19 2127 07/05/19 2240 07/06/19 0658 07/06/19 1155  GLUCAP 110* 66* 82 125* 146*   D-Dimer Recent Labs    07/04/19 0440  DDIMER 0.61*   Hgb A1c No results for input(s): HGBA1C in the last 72 hours. Lipid Profile No results for input(s): CHOL, HDL, LDLCALC, TRIG, CHOLHDL, LDLDIRECT in the last 72 hours. Thyroid function studies No results for input(s): TSH, T4TOTAL, T3FREE, THYROIDAB in the last 72 hours.  Invalid input(s): FREET3 Anemia work up No results for input(s): VITAMINB12, FOLATE, FERRITIN, TIBC, IRON, RETICCTPCT in the last 72 hours. Urinalysis    Component Value Date/Time   COLORURINE YELLOW 07/03/2019 0154   APPEARANCEUR CLEAR 07/03/2019 0154   LABSPEC >1.046 (H) 07/03/2019 0154   PHURINE 5.0 07/03/2019 0154   GLUCOSEU >=500 (A) 07/03/2019 0154   HGBUR NEGATIVE 07/03/2019 0154   BILIRUBINUR NEGATIVE 07/03/2019 0154   KETONESUR 80 (A) 07/03/2019 0154   PROTEINUR NEGATIVE 07/03/2019 0154   NITRITE NEGATIVE 07/03/2019 0154   LEUKOCYTESUR NEGATIVE 07/03/2019 0154    Microbiology Recent Results (from the past 240 hour(s))  Respiratory Panel by RT PCR (Flu A&B, Covid) - Nasopharyngeal Swab     Status: Abnormal   Collection Time: 07/02/19  6:39 PM   Specimen: Nasopharyngeal Swab  Result Value Ref Range Status   SARS Coronavirus 2 by RT PCR POSITIVE (A) NEGATIVE Final    Comment: RESULT CALLED TO, READ BACK BY AND VERIFIED WITH: D YAO MD 07/02/19 2031 JDW (NOTE) SARS-CoV-2 target nucleic acids are DETECTED. SARS-CoV-2 RNA is generally detectable in upper respiratory specimens  during the acute phase of infection. Positive results are indicative of the presence of the identified virus, but do not rule out bacterial infection or co-infection with other pathogens not detected by the test. Clinical correlation with patient history and other diagnostic information is necessary to determine patient infection  status. The expected result is Negative. Fact Sheet for Patients:  https://www.moore.com/ Fact Sheet for Healthcare Providers: https://www.young.biz/ This test is not yet approved or cleared by the [place] FDA and  has been authorized for detection and/or diagnosis of SARS-CoV-2 by FDA  under an Emergency Use Authorization (EUA).  This EUA will remain in effect (meaning this test can be used) for the dur ation of  the COVID-19 declaration under Section 564(b)(1) of the Act, 21 U.S.C. section 360bbb-3(b)(1), unless the authorization is terminated or revoked sooner.    Influenza A by PCR NEGATIVE NEGATIVE Final   Influenza B by PCR NEGATIVE NEGATIVE Final    Comment: (NOTE) The Xpert Xpress SARS-CoV-2/FLU/RSV assay is intended as an aid in  the diagnosis of influenza from Nasopharyngeal swab specimens and  should not be used as a sole basis for treatment. Nasal washings and  aspirates are unacceptable for Xpert Xpress SARS-CoV-2/FLU/RSV  testing. Fact Sheet for Patients: https://www.moore.com/ Fact Sheet for Healthcare Providers: https://www.young.biz/ This test is not yet approved or cleared by the [place] FDA and  has been authorized for detection and/or diagnosis of SARS-CoV-2 by  FDA under an Emergency Use Authorization (EUA). This EUA will remain  in effect (meaning this test can be used) for the duration of the  Covid-19 declaration under Section 564(b)(1) of the Act, 21  U.S.C. section 360bbb-3(b)(1), unless the authorization is  terminated or revoked. Performed at [facility], [address], [place], [place] [address]   Blood Culture (routine x 2)     Status: None (Preliminary result)   Collection Time: 07/02/19  9:24 PM   Specimen: BLOOD LEFT ARM  Result Value Ref Range Status   Specimen Description BLOOD LEFT ARM  Final   Special Requests   Final    BOTTLES DRAWN AEROBIC AND  ANAEROBIC Blood Culture results may not be optimal due to an inadequate volume of blood received in culture bottles   Culture   Final    NO GROWTH 4 DAYS Performed at [facility], [address] [place], [place] [address]    Report Status PENDING  Incomplete  Blood Culture (routine x 2)     Status: None (Preliminary result)   Collection Time: 07/02/19  9:24 PM   Specimen: BLOOD LEFT ARM  Result Value Ref Range Status   Specimen Description BLOOD LEFT ARM  Final   Special Requests   Final    BOTTLES DRAWN AEROBIC AND ANAEROBIC Blood Culture results may not be optimal due to an inadequate volume of blood received in culture bottles   Culture   Final    NO GROWTH 4 DAYS Performed at [facility], [address] [place], [place] [address]    Report Status PENDING  Incomplete    Time coordinating discharge: Approximately 40 minutes  Tyrone Nine, MD  [facility] 07/06/2019, 12:33 PM

## 2019-07-07 LAB — CULTURE, BLOOD (ROUTINE X 2)
Culture: NO GROWTH
Culture: NO GROWTH

## 2019-07-10 DIAGNOSIS — E118 Type 2 diabetes mellitus with unspecified complications: Secondary | ICD-10-CM | POA: Diagnosis not present

## 2019-07-10 DIAGNOSIS — U071 COVID-19: Secondary | ICD-10-CM | POA: Diagnosis not present

## 2019-07-10 DIAGNOSIS — I82409 Acute embolism and thrombosis of unspecified deep veins of unspecified lower extremity: Secondary | ICD-10-CM | POA: Diagnosis not present

## 2019-07-10 DIAGNOSIS — M069 Rheumatoid arthritis, unspecified: Secondary | ICD-10-CM | POA: Diagnosis not present

## 2019-07-14 DIAGNOSIS — U071 COVID-19: Secondary | ICD-10-CM | POA: Diagnosis not present

## 2019-07-14 DIAGNOSIS — E118 Type 2 diabetes mellitus with unspecified complications: Secondary | ICD-10-CM | POA: Diagnosis not present

## 2019-07-14 DIAGNOSIS — I82409 Acute embolism and thrombosis of unspecified deep veins of unspecified lower extremity: Secondary | ICD-10-CM | POA: Diagnosis not present

## 2019-07-14 DIAGNOSIS — I2510 Atherosclerotic heart disease of native coronary artery without angina pectoris: Secondary | ICD-10-CM | POA: Diagnosis not present

## 2019-07-16 ENCOUNTER — Ambulatory Visit: Payer: Medicare HMO | Admitting: Cardiology

## 2019-07-19 ENCOUNTER — Other Ambulatory Visit: Payer: Self-pay

## 2019-07-19 NOTE — Patient Outreach (Signed)
[facility]) Care Management  07/19/2019  William Hunter 10/19/1945 [id number]   Referral Date:  07/19/19 Referral Source: [employer] Report  Date of Admission: unknown Diagnosis:  unknown Date of Discharge: 07/16/19 Facility: [facility]  Insurance:  [facility]  Referral received.  No outreach warranted at this time.  Transition of Care calls being completed via EMMI. RN CM will outreach patient for any red flags received.    Plan: RN CM will close case  Bary Leriche, RN, MSN [facility] Care Management Care Management Coordinator Direct Line [phone] Toll Free: [phone]  Fax: [phone]

## 2019-07-22 DIAGNOSIS — J1282 Pneumonia due to coronavirus disease 2019: Secondary | ICD-10-CM | POA: Diagnosis not present

## 2019-07-22 DIAGNOSIS — I2510 Atherosclerotic heart disease of native coronary artery without angina pectoris: Secondary | ICD-10-CM | POA: Diagnosis not present

## 2019-07-22 DIAGNOSIS — M47812 Spondylosis without myelopathy or radiculopathy, cervical region: Secondary | ICD-10-CM | POA: Diagnosis not present

## 2019-07-22 DIAGNOSIS — U071 COVID-19: Secondary | ICD-10-CM | POA: Diagnosis not present

## 2019-07-22 DIAGNOSIS — E119 Type 2 diabetes mellitus without complications: Secondary | ICD-10-CM | POA: Diagnosis not present

## 2019-07-22 DIAGNOSIS — M5030 Other cervical disc degeneration, unspecified cervical region: Secondary | ICD-10-CM | POA: Diagnosis not present

## 2019-07-22 DIAGNOSIS — I10 Essential (primary) hypertension: Secondary | ICD-10-CM | POA: Diagnosis not present

## 2019-07-22 DIAGNOSIS — I82402 Acute embolism and thrombosis of unspecified deep veins of left lower extremity: Secondary | ICD-10-CM | POA: Diagnosis not present

## 2019-07-22 DIAGNOSIS — J9601 Acute respiratory failure with hypoxia: Secondary | ICD-10-CM | POA: Diagnosis not present

## 2019-07-28 ENCOUNTER — Other Ambulatory Visit: Payer: Self-pay | Admitting: Cardiology

## 2019-07-28 ENCOUNTER — Other Ambulatory Visit: Payer: Self-pay

## 2019-07-28 DIAGNOSIS — I2510 Atherosclerotic heart disease of native coronary artery without angina pectoris: Secondary | ICD-10-CM | POA: Diagnosis not present

## 2019-07-28 DIAGNOSIS — E119 Type 2 diabetes mellitus without complications: Secondary | ICD-10-CM | POA: Diagnosis not present

## 2019-07-28 DIAGNOSIS — M5030 Other cervical disc degeneration, unspecified cervical region: Secondary | ICD-10-CM | POA: Diagnosis not present

## 2019-07-28 DIAGNOSIS — M47812 Spondylosis without myelopathy or radiculopathy, cervical region: Secondary | ICD-10-CM | POA: Diagnosis not present

## 2019-07-28 DIAGNOSIS — U071 COVID-19: Secondary | ICD-10-CM | POA: Diagnosis not present

## 2019-07-28 DIAGNOSIS — I82402 Acute embolism and thrombosis of unspecified deep veins of left lower extremity: Secondary | ICD-10-CM | POA: Diagnosis not present

## 2019-07-28 DIAGNOSIS — J9601 Acute respiratory failure with hypoxia: Secondary | ICD-10-CM | POA: Diagnosis not present

## 2019-07-28 DIAGNOSIS — J1282 Pneumonia due to coronavirus disease 2019: Secondary | ICD-10-CM | POA: Diagnosis not present

## 2019-07-28 DIAGNOSIS — I10 Essential (primary) hypertension: Secondary | ICD-10-CM | POA: Diagnosis not present

## 2019-07-28 MED ORDER — BENAZEPRIL HCL 10 MG PO TABS: 10.0000 mg | ORAL_TABLET | Freq: Two times a day (BID) | ORAL | 3 refills | Status: DC

## 2019-07-28 NOTE — Telephone Encounter (Signed)
Pt's pharmacy is requesting a refill on benazepril. This medication has not been refilled since 2017. Would Dr. Anne Fu like to refill this medication? Please address

## 2019-07-28 NOTE — Telephone Encounter (Signed)
Received refill request for Eliquis 5mg  from [facility] pharmacy, [address]  Per Hospital d/c on 07/06/19, pt was started on Eliquis for acute DVT. Since this is not a cardiac indication for Eliquis refill should be sent to pt's PCP to refill.  Dr 07/08/19 did not rx this rx, and pt has not seen Dr Anne Fu since 03/25/18.  Faxed refill denial back to pharmacy with comment to forward rx request to pt's PCP Dr 03/27/18 for refill, since indication is not cardiac related.

## 2019-07-30 DIAGNOSIS — I82402 Acute embolism and thrombosis of unspecified deep veins of left lower extremity: Secondary | ICD-10-CM | POA: Diagnosis not present

## 2019-07-30 DIAGNOSIS — J1282 Pneumonia due to coronavirus disease 2019: Secondary | ICD-10-CM | POA: Diagnosis not present

## 2019-07-30 DIAGNOSIS — I2510 Atherosclerotic heart disease of native coronary artery without angina pectoris: Secondary | ICD-10-CM | POA: Diagnosis not present

## 2019-07-30 DIAGNOSIS — I10 Essential (primary) hypertension: Secondary | ICD-10-CM | POA: Diagnosis not present

## 2019-07-30 DIAGNOSIS — M5030 Other cervical disc degeneration, unspecified cervical region: Secondary | ICD-10-CM | POA: Diagnosis not present

## 2019-07-30 DIAGNOSIS — M47812 Spondylosis without myelopathy or radiculopathy, cervical region: Secondary | ICD-10-CM | POA: Diagnosis not present

## 2019-07-30 DIAGNOSIS — U071 COVID-19: Secondary | ICD-10-CM | POA: Diagnosis not present

## 2019-07-30 DIAGNOSIS — E119 Type 2 diabetes mellitus without complications: Secondary | ICD-10-CM | POA: Diagnosis not present

## 2019-07-30 DIAGNOSIS — J9601 Acute respiratory failure with hypoxia: Secondary | ICD-10-CM | POA: Diagnosis not present

## 2019-08-02 DIAGNOSIS — E559 Vitamin D deficiency, unspecified: Secondary | ICD-10-CM | POA: Diagnosis not present

## 2019-08-02 DIAGNOSIS — I10 Essential (primary) hypertension: Secondary | ICD-10-CM | POA: Diagnosis not present

## 2019-08-02 DIAGNOSIS — E118 Type 2 diabetes mellitus with unspecified complications: Secondary | ICD-10-CM | POA: Diagnosis not present

## 2019-08-02 DIAGNOSIS — E7800 Pure hypercholesterolemia, unspecified: Secondary | ICD-10-CM | POA: Diagnosis not present

## 2019-08-03 DIAGNOSIS — M47812 Spondylosis without myelopathy or radiculopathy, cervical region: Secondary | ICD-10-CM | POA: Diagnosis not present

## 2019-08-03 DIAGNOSIS — M5030 Other cervical disc degeneration, unspecified cervical region: Secondary | ICD-10-CM | POA: Diagnosis not present

## 2019-08-03 DIAGNOSIS — J1282 Pneumonia due to coronavirus disease 2019: Secondary | ICD-10-CM | POA: Diagnosis not present

## 2019-08-03 DIAGNOSIS — U071 COVID-19: Secondary | ICD-10-CM | POA: Diagnosis not present

## 2019-08-03 DIAGNOSIS — I10 Essential (primary) hypertension: Secondary | ICD-10-CM | POA: Diagnosis not present

## 2019-08-03 DIAGNOSIS — J9601 Acute respiratory failure with hypoxia: Secondary | ICD-10-CM | POA: Diagnosis not present

## 2019-08-03 DIAGNOSIS — E119 Type 2 diabetes mellitus without complications: Secondary | ICD-10-CM | POA: Diagnosis not present

## 2019-08-03 DIAGNOSIS — I82402 Acute embolism and thrombosis of unspecified deep veins of left lower extremity: Secondary | ICD-10-CM | POA: Diagnosis not present

## 2019-08-03 DIAGNOSIS — I2510 Atherosclerotic heart disease of native coronary artery without angina pectoris: Secondary | ICD-10-CM | POA: Diagnosis not present

## 2019-08-04 DIAGNOSIS — I10 Essential (primary) hypertension: Secondary | ICD-10-CM | POA: Diagnosis not present

## 2019-08-04 DIAGNOSIS — E118 Type 2 diabetes mellitus with unspecified complications: Secondary | ICD-10-CM | POA: Diagnosis not present

## 2019-08-04 DIAGNOSIS — I2510 Atherosclerotic heart disease of native coronary artery without angina pectoris: Secondary | ICD-10-CM | POA: Diagnosis not present

## 2019-08-04 DIAGNOSIS — Z0000 Encounter for general adult medical examination without abnormal findings: Secondary | ICD-10-CM | POA: Diagnosis not present

## 2019-08-05 DIAGNOSIS — J9601 Acute respiratory failure with hypoxia: Secondary | ICD-10-CM | POA: Diagnosis not present

## 2019-08-05 DIAGNOSIS — I82402 Acute embolism and thrombosis of unspecified deep veins of left lower extremity: Secondary | ICD-10-CM | POA: Diagnosis not present

## 2019-08-05 DIAGNOSIS — I10 Essential (primary) hypertension: Secondary | ICD-10-CM | POA: Diagnosis not present

## 2019-08-05 DIAGNOSIS — M47812 Spondylosis without myelopathy or radiculopathy, cervical region: Secondary | ICD-10-CM | POA: Diagnosis not present

## 2019-08-05 DIAGNOSIS — U071 COVID-19: Secondary | ICD-10-CM | POA: Diagnosis not present

## 2019-08-05 DIAGNOSIS — M5030 Other cervical disc degeneration, unspecified cervical region: Secondary | ICD-10-CM | POA: Diagnosis not present

## 2019-08-05 DIAGNOSIS — I2510 Atherosclerotic heart disease of native coronary artery without angina pectoris: Secondary | ICD-10-CM | POA: Diagnosis not present

## 2019-08-05 DIAGNOSIS — J1282 Pneumonia due to coronavirus disease 2019: Secondary | ICD-10-CM | POA: Diagnosis not present

## 2019-08-05 DIAGNOSIS — E119 Type 2 diabetes mellitus without complications: Secondary | ICD-10-CM | POA: Diagnosis not present

## 2019-08-06 DIAGNOSIS — I10 Essential (primary) hypertension: Secondary | ICD-10-CM | POA: Diagnosis not present

## 2019-08-06 DIAGNOSIS — J1282 Pneumonia due to coronavirus disease 2019: Secondary | ICD-10-CM | POA: Diagnosis not present

## 2019-08-06 DIAGNOSIS — I2510 Atherosclerotic heart disease of native coronary artery without angina pectoris: Secondary | ICD-10-CM | POA: Diagnosis not present

## 2019-08-06 DIAGNOSIS — J9601 Acute respiratory failure with hypoxia: Secondary | ICD-10-CM | POA: Diagnosis not present

## 2019-08-06 DIAGNOSIS — M47812 Spondylosis without myelopathy or radiculopathy, cervical region: Secondary | ICD-10-CM | POA: Diagnosis not present

## 2019-08-06 DIAGNOSIS — E119 Type 2 diabetes mellitus without complications: Secondary | ICD-10-CM | POA: Diagnosis not present

## 2019-08-06 DIAGNOSIS — M5030 Other cervical disc degeneration, unspecified cervical region: Secondary | ICD-10-CM | POA: Diagnosis not present

## 2019-08-06 DIAGNOSIS — I82402 Acute embolism and thrombosis of unspecified deep veins of left lower extremity: Secondary | ICD-10-CM | POA: Diagnosis not present

## 2019-08-06 DIAGNOSIS — U071 COVID-19: Secondary | ICD-10-CM | POA: Diagnosis not present

## 2019-08-09 DIAGNOSIS — M5030 Other cervical disc degeneration, unspecified cervical region: Secondary | ICD-10-CM | POA: Diagnosis not present

## 2019-08-09 DIAGNOSIS — I2510 Atherosclerotic heart disease of native coronary artery without angina pectoris: Secondary | ICD-10-CM | POA: Diagnosis not present

## 2019-08-09 DIAGNOSIS — I82402 Acute embolism and thrombosis of unspecified deep veins of left lower extremity: Secondary | ICD-10-CM | POA: Diagnosis not present

## 2019-08-09 DIAGNOSIS — J9601 Acute respiratory failure with hypoxia: Secondary | ICD-10-CM | POA: Diagnosis not present

## 2019-08-09 DIAGNOSIS — U071 COVID-19: Secondary | ICD-10-CM | POA: Diagnosis not present

## 2019-08-09 DIAGNOSIS — M47812 Spondylosis without myelopathy or radiculopathy, cervical region: Secondary | ICD-10-CM | POA: Diagnosis not present

## 2019-08-09 DIAGNOSIS — I10 Essential (primary) hypertension: Secondary | ICD-10-CM | POA: Diagnosis not present

## 2019-08-09 DIAGNOSIS — E119 Type 2 diabetes mellitus without complications: Secondary | ICD-10-CM | POA: Diagnosis not present

## 2019-08-09 DIAGNOSIS — J1282 Pneumonia due to coronavirus disease 2019: Secondary | ICD-10-CM | POA: Diagnosis not present

## 2019-08-10 ENCOUNTER — Encounter: Payer: Self-pay | Admitting: Cardiology

## 2019-08-10 ENCOUNTER — Other Ambulatory Visit: Payer: Self-pay

## 2019-08-10 ENCOUNTER — Ambulatory Visit: Payer: Medicare HMO | Admitting: Cardiology

## 2019-08-10 VITALS — BP 124/80 | HR 94 | Ht 72.0 in | Wt 192.0 lb

## 2019-08-10 DIAGNOSIS — E785 Hyperlipidemia, unspecified: Secondary | ICD-10-CM | POA: Diagnosis not present

## 2019-08-10 DIAGNOSIS — E119 Type 2 diabetes mellitus without complications: Secondary | ICD-10-CM | POA: Diagnosis not present

## 2019-08-10 DIAGNOSIS — I201 Angina pectoris with documented spasm: Secondary | ICD-10-CM

## 2019-08-10 DIAGNOSIS — I10 Essential (primary) hypertension: Secondary | ICD-10-CM | POA: Diagnosis not present

## 2019-08-10 MED ORDER — APIXABAN 5 MG PO TABS: 5.0000 mg | ORAL_TABLET | Freq: Two times a day (BID) | ORAL | 2 refills | Status: DC

## 2019-08-10 MED ORDER — ATORVASTATIN CALCIUM 20 MG PO TABS: 20.0000 mg | ORAL_TABLET | Freq: Every day | ORAL | 3 refills | Status: DC

## 2019-08-10 NOTE — Progress Notes (Signed)
Cardiology Office Note:    Date:  08/10/2019   ID:  William Hunter, DOB 09/02/1945, MRN [id number]  PCP:  Irena Reichmann, DO  Cardiologist:  Donato Schultz, MD  Electrophysiologist:  None   Referring MD: Irena Reichmann, DO     History of Present Illness:    William Hunter is a 74 y.o. male here for follow-up with recent hospitalization discharge on 07/06/2019 after COVID-19 acute respiratory failure.  He has a history of rheumatoid arthritis on Humira and sulfasalazine.  Also has diabetes with hypertension.  He had weakness after a fall at home after 2 weeks of fatigue dyspnea nausea vomiting diarrhea mildly hypoxic.  Covid positive.  Remdesivir was provided, [employer].  He has an allergy to prednisone therefore steroids were held.  A filling defect was noted in the left femoral superficial profunda vein on CT of the abdomen pelvis and confirmed left lower extremity DVT.  There is no PE on CT of the chest.  Asymptomatic.  Anticoagulation was started.  Eliquis on discharge.  Physical therapy was provided.  Overall he is slowly returning back to normal.  Feeling better.  No chest pain no significant shortness of breath.  Still utilizing walker.  He was in rehab and eating, was across the hall from the 74 year old lady who had Covid.  I had seen him last back in 2019-had a myocardial infarction at age 75, heart catheterization in 1981 by Dr. Aleen Campi showing no CAD was diagnosed with vasospasm.    Past Medical History:  Diagnosis Date  . Anginal pain ([name]   . Arthritis    "fingers" (03/09/2012)  . Cold feet    "from my diabetes; they stay cold" (03/09/2012)  . Coronary artery disease   . Coronary artery spasm [facility])    since age 8 Dr Aleen Campi  . Dyslipidemia   . Headache(784.0)   . Hx of gallstones    Dr Derrell Lolling lap cholecystectomy  . Hx of plastic surgery    plastic surgery following a fall off a loading dock ,lost  all his upper teeth   . Hypertension   . MI, old    at age 46   . Migraines   . Pneumonia ~ 1962; 1970's   "double"; "regular" (03/09/2012)  . Rheumatoid arthritis ([name]    Dr Dierdre Forth   . Slipped cervical disc 2008   Dr Patsi Sears in the past/ Dr Larita Fife , chiropractor in 2008  . Thyroid disease    Dr Lucianne Muss  . Type II diabetes mellitus ([name]     Past Surgical History:  Procedure Laterality Date  . CARDIAC CATHETERIZATION     x 3 with NO stents   . CHOLECYSTECTOMY  03/11/2012   Procedure: LAPAROSCOPIC CHOLECYSTECTOMY WITH INTRAOPERATIVE CHOLANGIOGRAM;  Surgeon: Ernestene Mention, MD;  Location: [facility] OR;  Service: General;  Laterality: N/A;  . PENILE PROSTHESIS IMPLANT  1983    Current Medications: Current Meds  Medication Sig  . Adalimumab (HUMIRA) 40 MG/0.4ML PSKT Inject 40 mg into the skin every 14 (fourteen) days. Every other Thursday  . benazepril (LOTENSIN) 10 MG tablet Take 1 tablet (10 mg total) by mouth 2 (two) times daily.  Marland Kitchen diltiazem (TIAZAC) 300 MG 24 hr capsule Take 1 capsule (300 mg total) by mouth daily.  . empagliflozin (JARDIANCE) 10 MG TABS tablet Take 10 mg by mouth daily.  Marland Kitchen ibuprofen (ADVIL) 200 MG tablet Take 400 mg by mouth daily.  . insulin aspart (NOVOLOG) 100 UNIT/ML injection Inject 20 Units  into the skin 3 (three) times daily before meals.  . Insulin Glargine (LANTUS SOLOSTAR) 100 UNIT/ML Solostar Pen Inject 30 Units into the skin at bedtime.  Marland Kitchen loratadine (CLARITIN) 10 MG tablet Take 10 mg by mouth daily.  . metFORMIN (GLUCOPHAGE-XR) 500 MG 24 hr tablet Take 1,000 mg by mouth 2 (two) times daily.  Marland Kitchen OVER THE COUNTER MEDICATION Apply 1 application topically daily. Hemp cream - for pain  . sulfaSALAzine (AZULFIDINE) 500 MG tablet Take 1,500 mg by mouth 2 (two) times daily.  . traZODone (DESYREL) 50 MG tablet Take 1 tablet (50 mg total) by mouth at bedtime as needed for sleep.     Allergies:   Codeine, Penicillins, Prednisone, and Wool alcohol [lanolin]   Social History   Socioeconomic History  . Marital status:  Married    Spouse name: William Hunter  . Number of children: 0  . Years of education: 51  . Highest education level: Not on file  Occupational History    Comment: [employer]  Tobacco Use  . Smoking status: Never Smoker  . Smokeless tobacco: Never Used  Substance and Sexual Activity  . Alcohol use: No  . Drug use: No  . Sexual activity: Yes  Other Topics Concern  . Not on file  Social History Narrative   Lives with wife   Caffeine- Diet Coke 5 daily   Social Determinants of Health   Financial Resource Strain:   . Difficulty of Paying Living Expenses: Not on file  Food Insecurity:   . Worried About Programme researcher, broadcasting/film/video in the Last Year: Not on file  . Ran Out of Food in the Last Year: Not on file  Transportation Needs:   . Lack of Transportation (Medical): Not on file  . Lack of Transportation (Non-Medical): Not on file  Physical Activity:   . Days of Exercise per Week: Not on file  . Minutes of Exercise per Session: Not on file  Stress:   . Feeling of Stress : Not on file  Social Connections:   . Frequency of Communication with Friends and Family: Not on file  . Frequency of Social Gatherings with Friends and Family: Not on file  . Attends Religious Services: Not on file  . Active Member of Clubs or Organizations: Not on file  . Attends Banker Meetings: Not on file  . Marital Status: Not on file     Family History: The patient's family history includes Cancer - Other in his mother and sister; Heart attack in his sister; Heart disease in his father; Heart failure in his mother.  ROS:   Please see the history of present illness.     All other systems reviewed and are negative.  EKGs/Labs/Other Studies Reviewed:    The following studies were reviewed today: As above  EKG:  EKG is not ordered today. EKG from 2019 showed sinus rhythm possible old inferior infarct pattern no change from prior.  Recent Labs: 07/04/2019: Hemoglobin 13.3; Platelets  243 07/05/2019: ALT 16; BUN 19; Creatinine, Ser 0.77; Potassium 3.6; Sodium 135  Recent Lipid Panel    Component Value Date/Time   CHOL 140 09/16/2013 0817   TRIG 106 07/02/2019 2037   HDL 36.50 (L) 09/16/2013 0817   CHOLHDL 4 09/16/2013 0817   VLDL 23.4 09/16/2013 0817   LDLCALC 80 09/16/2013 0817    Physical Exam:    VS:  BP 124/80   Pulse 94   Ht 6' (1.829 m)   Wt 192 lb (  87.1 kg)   SpO2 96%   BMI 26.04 kg/m     Wt Readings from Last 3 Encounters:  08/10/19 192 lb (87.1 kg)  07/04/19 216 lb 7.9 oz (98.2 kg)  03/25/18 215 lb 9.6 oz (97.8 kg)     GEN:  Well nourished, well developed in no acute distress, utilizing walker HEENT: Normal NECK: No JVD; No carotid bruits LYMPHATICS: No lymphadenopathy CARDIAC: RRR, no murmurs, rubs, gallops RESPIRATORY:  Clear to auscultation without rales, wheezing or rhonchi  ABDOMEN: Soft, non-tender, non-distended MUSCULOSKELETAL:  No edema; No deformity  SKIN: Warm and dry NEUROLOGIC:  Alert and oriented x 3 PSYCHIATRIC:  Normal affect   ASSESSMENT:    1. Coronary vasospasm ([name]   2. Dyslipidemia   3. Diabetes mellitus with coincident hypertension ([name]    PLAN:    In order of problems listed above:  COVID-19 infection January 2021 -Hypoxia.  Remdesivir.  No prednisone or steroid given his allergy to steroids.  Rehab. Trop neg.   Left femoral DVT -Seen on CT scan.  No PE. Superficial femoral profunda vein.  -Apixaban for 3 months total.  Start date January 2021.  He states that he actually had run out on the medication, we will refill.  Diabetes with hypertension hyperlipidemia -LDL goal less than 70.  No myalgias.  LDL previously was 76.  I do not see a statin on his medication list.  I will place him on atorvastatin 20 mg once a day.  Watch for any myalgias.  Prior myocardial infarction in his early 62s -Could have been a type II myocardial infarction in the setting of vasospasm.  Nuclear stress test 2013 was low risk  no ischemia.  No CAD was noted on cardiac catheterization.  In 6 months, we will have him revisit with Vernona Rieger, 1 year with me.  Medication Adjustments/Labs and Tests Ordered: Current medicines are reviewed at length with the patient today.  Concerns regarding medicines are outlined above.  No orders of the defined types were placed in this encounter.  Meds ordered this encounter  Medications  . apixaban (ELIQUIS) 5 MG TABS tablet    Sig: Take 1 tablet (5 mg total) by mouth 2 (two) times daily.    Dispense:  60 tablet    Refill:  2    Pt to take 5 mg twice a day for a total of 3 months  . atorvastatin (LIPITOR) 20 MG tablet    Sig: Take 1 tablet (20 mg total) by mouth daily.    Dispense:  90 tablet    Refill:  3    Patient Instructions  Medication Instructions:  Please start Eliquis 5 mg one tablet twice a day for a total of 3 months. Start Atorvastatin 20 mg once a day. Continue all other medications as listed.  *If you need a refill on your cardiac medications before your next appointment, please call your pharmacy*  Follow-Up: At [facility], you and your health needs are our priority.  As part of our continuing mission to provide you with exceptional heart care, we have created designated Provider Care Teams.  These Care Teams include your primary Cardiologist (physician) and Advanced Practice Providers (APPs -  Physician Assistants and Nurse Practitioners) who all work together to provide you with the care you need, when you need it.  We recommend signing up for the patient portal called "MyChart".  Sign up information is provided on this After Visit Summary.  MyChart is used to connect with patients  for Virtual Visits (Telemedicine).  Patients are able to view lab/test results, encounter notes, upcoming appointments, etc.  Non-urgent messages can be sent to your provider as well.   To learn more about what you can do with MyChart, go to [email or web address].    Your next  appointment:   6 month(s)  The format for your next appointment:   In Person  Provider:   Cecilie Kicks, NP and Dr Marlou Porch in 1 year  Thank you for choosing [facility]!!         Signed, Candee Furbish, MD  08/10/2019 12:21 PM    [place]

## 2019-08-10 NOTE — Patient Instructions (Signed)
Medication Instructions:  Please start Eliquis 5 mg one tablet twice a day for a total of 3 months. Start Atorvastatin 20 mg once a day. Continue all other medications as listed.  *If you need a refill on your cardiac medications before your next appointment, please call your pharmacy*  Follow-Up: At [facility], you and your health needs are our priority.  As part of our continuing mission to provide you with exceptional heart care, we have created designated Provider Care Teams.  These Care Teams include your primary Cardiologist (physician) and Advanced Practice Providers (APPs -  Physician Assistants and Nurse Practitioners) who all work together to provide you with the care you need, when you need it.  We recommend signing up for the patient portal called "MyChart".  Sign up information is provided on this After Visit Summary.  MyChart is used to connect with patients for Virtual Visits (Telemedicine).  Patients are able to view lab/test results, encounter notes, upcoming appointments, etc.  Non-urgent messages can be sent to your provider as well.   To learn more about what you can do with MyChart, go to [email or web address].    Your next appointment:   6 month(s)  The format for your next appointment:   In Person  Provider:   Nada Boozer, NP and Dr Anne Fu in 1 year  Thank you for choosing [facility]!!

## 2019-08-11 DIAGNOSIS — J9601 Acute respiratory failure with hypoxia: Secondary | ICD-10-CM | POA: Diagnosis not present

## 2019-08-11 DIAGNOSIS — J1282 Pneumonia due to coronavirus disease 2019: Secondary | ICD-10-CM | POA: Diagnosis not present

## 2019-08-11 DIAGNOSIS — M5030 Other cervical disc degeneration, unspecified cervical region: Secondary | ICD-10-CM | POA: Diagnosis not present

## 2019-08-11 DIAGNOSIS — M47812 Spondylosis without myelopathy or radiculopathy, cervical region: Secondary | ICD-10-CM | POA: Diagnosis not present

## 2019-08-11 DIAGNOSIS — E119 Type 2 diabetes mellitus without complications: Secondary | ICD-10-CM | POA: Diagnosis not present

## 2019-08-11 DIAGNOSIS — U071 COVID-19: Secondary | ICD-10-CM | POA: Diagnosis not present

## 2019-08-11 DIAGNOSIS — I82402 Acute embolism and thrombosis of unspecified deep veins of left lower extremity: Secondary | ICD-10-CM | POA: Diagnosis not present

## 2019-08-11 DIAGNOSIS — I10 Essential (primary) hypertension: Secondary | ICD-10-CM | POA: Diagnosis not present

## 2019-08-11 DIAGNOSIS — I2510 Atherosclerotic heart disease of native coronary artery without angina pectoris: Secondary | ICD-10-CM | POA: Diagnosis not present

## 2019-08-13 DIAGNOSIS — I2510 Atherosclerotic heart disease of native coronary artery without angina pectoris: Secondary | ICD-10-CM | POA: Diagnosis not present

## 2019-08-13 DIAGNOSIS — M47812 Spondylosis without myelopathy or radiculopathy, cervical region: Secondary | ICD-10-CM | POA: Diagnosis not present

## 2019-08-13 DIAGNOSIS — I82402 Acute embolism and thrombosis of unspecified deep veins of left lower extremity: Secondary | ICD-10-CM | POA: Diagnosis not present

## 2019-08-13 DIAGNOSIS — U071 COVID-19: Secondary | ICD-10-CM | POA: Diagnosis not present

## 2019-08-13 DIAGNOSIS — M5030 Other cervical disc degeneration, unspecified cervical region: Secondary | ICD-10-CM | POA: Diagnosis not present

## 2019-08-13 DIAGNOSIS — I10 Essential (primary) hypertension: Secondary | ICD-10-CM | POA: Diagnosis not present

## 2019-08-13 DIAGNOSIS — J9601 Acute respiratory failure with hypoxia: Secondary | ICD-10-CM | POA: Diagnosis not present

## 2019-08-13 DIAGNOSIS — J1282 Pneumonia due to coronavirus disease 2019: Secondary | ICD-10-CM | POA: Diagnosis not present

## 2019-08-13 DIAGNOSIS — E119 Type 2 diabetes mellitus without complications: Secondary | ICD-10-CM | POA: Diagnosis not present

## 2019-08-17 DIAGNOSIS — M47812 Spondylosis without myelopathy or radiculopathy, cervical region: Secondary | ICD-10-CM | POA: Diagnosis not present

## 2019-08-17 DIAGNOSIS — E119 Type 2 diabetes mellitus without complications: Secondary | ICD-10-CM | POA: Diagnosis not present

## 2019-08-17 DIAGNOSIS — I2510 Atherosclerotic heart disease of native coronary artery without angina pectoris: Secondary | ICD-10-CM | POA: Diagnosis not present

## 2019-08-17 DIAGNOSIS — U071 COVID-19: Secondary | ICD-10-CM | POA: Diagnosis not present

## 2019-08-17 DIAGNOSIS — M5030 Other cervical disc degeneration, unspecified cervical region: Secondary | ICD-10-CM | POA: Diagnosis not present

## 2019-08-17 DIAGNOSIS — I82402 Acute embolism and thrombosis of unspecified deep veins of left lower extremity: Secondary | ICD-10-CM | POA: Diagnosis not present

## 2019-08-17 DIAGNOSIS — I10 Essential (primary) hypertension: Secondary | ICD-10-CM | POA: Diagnosis not present

## 2019-08-17 DIAGNOSIS — J1282 Pneumonia due to coronavirus disease 2019: Secondary | ICD-10-CM | POA: Diagnosis not present

## 2019-08-17 DIAGNOSIS — J9601 Acute respiratory failure with hypoxia: Secondary | ICD-10-CM | POA: Diagnosis not present

## 2019-08-21 DIAGNOSIS — E119 Type 2 diabetes mellitus without complications: Secondary | ICD-10-CM | POA: Diagnosis not present

## 2019-08-21 DIAGNOSIS — I10 Essential (primary) hypertension: Secondary | ICD-10-CM | POA: Diagnosis not present

## 2019-08-21 DIAGNOSIS — U071 COVID-19: Secondary | ICD-10-CM | POA: Diagnosis not present

## 2019-08-21 DIAGNOSIS — M47812 Spondylosis without myelopathy or radiculopathy, cervical region: Secondary | ICD-10-CM | POA: Diagnosis not present

## 2019-08-21 DIAGNOSIS — M5030 Other cervical disc degeneration, unspecified cervical region: Secondary | ICD-10-CM | POA: Diagnosis not present

## 2019-08-21 DIAGNOSIS — J9601 Acute respiratory failure with hypoxia: Secondary | ICD-10-CM | POA: Diagnosis not present

## 2019-08-21 DIAGNOSIS — I2510 Atherosclerotic heart disease of native coronary artery without angina pectoris: Secondary | ICD-10-CM | POA: Diagnosis not present

## 2019-08-21 DIAGNOSIS — J1282 Pneumonia due to coronavirus disease 2019: Secondary | ICD-10-CM | POA: Diagnosis not present

## 2019-08-21 DIAGNOSIS — I82402 Acute embolism and thrombosis of unspecified deep veins of left lower extremity: Secondary | ICD-10-CM | POA: Diagnosis not present

## 2019-08-24 DIAGNOSIS — E119 Type 2 diabetes mellitus without complications: Secondary | ICD-10-CM | POA: Diagnosis not present

## 2019-08-24 DIAGNOSIS — M47812 Spondylosis without myelopathy or radiculopathy, cervical region: Secondary | ICD-10-CM | POA: Diagnosis not present

## 2019-08-24 DIAGNOSIS — J9601 Acute respiratory failure with hypoxia: Secondary | ICD-10-CM | POA: Diagnosis not present

## 2019-08-24 DIAGNOSIS — U071 COVID-19: Secondary | ICD-10-CM | POA: Diagnosis not present

## 2019-08-24 DIAGNOSIS — I2510 Atherosclerotic heart disease of native coronary artery without angina pectoris: Secondary | ICD-10-CM | POA: Diagnosis not present

## 2019-08-24 DIAGNOSIS — J1282 Pneumonia due to coronavirus disease 2019: Secondary | ICD-10-CM | POA: Diagnosis not present

## 2019-08-24 DIAGNOSIS — I10 Essential (primary) hypertension: Secondary | ICD-10-CM | POA: Diagnosis not present

## 2019-08-24 DIAGNOSIS — I82402 Acute embolism and thrombosis of unspecified deep veins of left lower extremity: Secondary | ICD-10-CM | POA: Diagnosis not present

## 2019-08-24 DIAGNOSIS — M5030 Other cervical disc degeneration, unspecified cervical region: Secondary | ICD-10-CM | POA: Diagnosis not present

## 2019-08-25 DIAGNOSIS — E119 Type 2 diabetes mellitus without complications: Secondary | ICD-10-CM | POA: Diagnosis not present

## 2019-08-25 DIAGNOSIS — U071 COVID-19: Secondary | ICD-10-CM | POA: Diagnosis not present

## 2019-08-25 DIAGNOSIS — I82402 Acute embolism and thrombosis of unspecified deep veins of left lower extremity: Secondary | ICD-10-CM | POA: Diagnosis not present

## 2019-08-25 DIAGNOSIS — J1282 Pneumonia due to coronavirus disease 2019: Secondary | ICD-10-CM | POA: Diagnosis not present

## 2019-08-25 DIAGNOSIS — M5030 Other cervical disc degeneration, unspecified cervical region: Secondary | ICD-10-CM | POA: Diagnosis not present

## 2019-08-25 DIAGNOSIS — M47812 Spondylosis without myelopathy or radiculopathy, cervical region: Secondary | ICD-10-CM | POA: Diagnosis not present

## 2019-08-25 DIAGNOSIS — I10 Essential (primary) hypertension: Secondary | ICD-10-CM | POA: Diagnosis not present

## 2019-08-25 DIAGNOSIS — J9601 Acute respiratory failure with hypoxia: Secondary | ICD-10-CM | POA: Diagnosis not present

## 2019-08-25 DIAGNOSIS — I2510 Atherosclerotic heart disease of native coronary artery without angina pectoris: Secondary | ICD-10-CM | POA: Diagnosis not present

## 2019-08-31 DIAGNOSIS — I82402 Acute embolism and thrombosis of unspecified deep veins of left lower extremity: Secondary | ICD-10-CM | POA: Diagnosis not present

## 2019-08-31 DIAGNOSIS — M5030 Other cervical disc degeneration, unspecified cervical region: Secondary | ICD-10-CM | POA: Diagnosis not present

## 2019-08-31 DIAGNOSIS — J9601 Acute respiratory failure with hypoxia: Secondary | ICD-10-CM | POA: Diagnosis not present

## 2019-08-31 DIAGNOSIS — I2510 Atherosclerotic heart disease of native coronary artery without angina pectoris: Secondary | ICD-10-CM | POA: Diagnosis not present

## 2019-08-31 DIAGNOSIS — J1282 Pneumonia due to coronavirus disease 2019: Secondary | ICD-10-CM | POA: Diagnosis not present

## 2019-08-31 DIAGNOSIS — E119 Type 2 diabetes mellitus without complications: Secondary | ICD-10-CM | POA: Diagnosis not present

## 2019-08-31 DIAGNOSIS — I10 Essential (primary) hypertension: Secondary | ICD-10-CM | POA: Diagnosis not present

## 2019-08-31 DIAGNOSIS — U071 COVID-19: Secondary | ICD-10-CM | POA: Diagnosis not present

## 2019-08-31 DIAGNOSIS — M47812 Spondylosis without myelopathy or radiculopathy, cervical region: Secondary | ICD-10-CM | POA: Diagnosis not present

## 2019-09-07 DIAGNOSIS — Z79899 Other long term (current) drug therapy: Secondary | ICD-10-CM | POA: Diagnosis not present

## 2019-09-07 DIAGNOSIS — M0589 Other rheumatoid arthritis with rheumatoid factor of multiple sites: Secondary | ICD-10-CM | POA: Diagnosis not present

## 2019-09-07 DIAGNOSIS — M79643 Pain in unspecified hand: Secondary | ICD-10-CM | POA: Diagnosis not present

## 2019-09-07 DIAGNOSIS — M150 Primary generalized (osteo)arthritis: Secondary | ICD-10-CM | POA: Diagnosis not present

## 2019-09-16 ENCOUNTER — Ambulatory Visit: Payer: Medicare HMO | Attending: Internal Medicine

## 2019-09-16 DIAGNOSIS — Z23 Encounter for immunization: Secondary | ICD-10-CM

## 2019-09-16 NOTE — Progress Notes (Signed)
   Covid-19 Vaccination Clinic  Name:  TARAN HAYNESWORTH    MRN: [id number] DOB: 1946/01/24  09/16/2019  Mr. Turton was observed post Covid-19 immunization for 15 minutes without incident. He was provided with Vaccine Information Sheet and instruction to access the V-Safe system.   Mr. Schoneman was instructed to call 911 with any severe reactions post vaccine: Marland Kitchen Difficulty breathing  . Swelling of face and throat  . A fast heartbeat  . A bad rash all over body  . Dizziness and weakness   Immunizations Administered    Name Date Dose VIS Date Route   Pfizer COVID-19 Vaccine 09/16/2019  8:18 AM 0.3 mL 05/21/2019 Intramuscular   Manufacturer: [employer], [employer]   Lot: [id number]   NDC: [address]-7

## 2019-10-06 NOTE — Telephone Encounter (Signed)
Error

## 2019-10-11 ENCOUNTER — Other Ambulatory Visit: Payer: Self-pay | Admitting: Cardiology

## 2019-10-11 ENCOUNTER — Ambulatory Visit: Payer: Medicare HMO | Attending: Internal Medicine

## 2019-10-11 DIAGNOSIS — Z23 Encounter for immunization: Secondary | ICD-10-CM

## 2019-10-11 NOTE — Progress Notes (Signed)
   Covid-19 Vaccination Clinic  Name:  KRIS NO    MRN: [id number] DOB: [birth year]  10/11/2019  Mr. Whitehurst was observed post Covid-19 immunization for 15 minutes without incident. He was provided with Vaccine Information Sheet and instruction to access the V-Safe system.   Mr. Sigl was instructed to call 911 with any severe reactions post vaccine: Marland Kitchen Difficulty breathing  . Swelling of face and throat  . A fast heartbeat  . A bad rash all over body  . Dizziness and weakness   Immunizations Administered    Name Date Dose VIS Date Route   Pfizer COVID-19 Vaccine 10/11/2019  8:17 AM 0.3 mL 08/04/2018 Intramuscular   Manufacturer: [employer], [employer]   Lot: [id number]   NDC: [address]-3

## 2019-11-02 ENCOUNTER — Other Ambulatory Visit: Payer: Self-pay | Admitting: Cardiology

## 2019-11-15 ENCOUNTER — Other Ambulatory Visit: Payer: Self-pay | Admitting: Cardiology

## 2019-11-15 NOTE — Telephone Encounter (Signed)
Pt last saw Dr Anne Fu 08/10/19, last labs 07/05/19 Creat 0.77, age 74, weight 87.1kg, based on specified criteria pt is on appropriate dosage of Eliquis 5mg  BID.  Will refill rx.

## 2019-11-24 ENCOUNTER — Telehealth: Payer: Self-pay

## 2019-11-24 NOTE — Telephone Encounter (Signed)
[facility] pharmacy requesting a change on pt's medication Atorvastatin 20 mg tablet. Pt stated that he does not take this medication because he can not tolerate the side effects. However, pt agrees to try a different statin or lower dose. Pharmacy would like Dr. Anne Fu to sent in a new Rx for a lower dose or a different statin drug. Please address

## 2019-11-24 NOTE — Telephone Encounter (Signed)
Attempted to call pt and get more details re below message and home phone number  is no longer in service. Mobile number no answer will try later ./cy

## 2019-11-25 NOTE — Telephone Encounter (Signed)
Phone number listed (home #) is no longer in service.  Left message on wife's VM [place] on [facility])  To call back to discuss what side effect he had on Atorvastatin.

## 2019-11-29 DIAGNOSIS — E118 Type 2 diabetes mellitus with unspecified complications: Secondary | ICD-10-CM | POA: Diagnosis not present

## 2019-12-06 DIAGNOSIS — E0590 Thyrotoxicosis, unspecified without thyrotoxic crisis or storm: Secondary | ICD-10-CM | POA: Diagnosis not present

## 2019-12-06 DIAGNOSIS — I2510 Atherosclerotic heart disease of native coronary artery without angina pectoris: Secondary | ICD-10-CM | POA: Diagnosis not present

## 2019-12-06 DIAGNOSIS — E118 Type 2 diabetes mellitus with unspecified complications: Secondary | ICD-10-CM | POA: Diagnosis not present

## 2019-12-06 DIAGNOSIS — R809 Proteinuria, unspecified: Secondary | ICD-10-CM | POA: Diagnosis not present

## 2019-12-06 NOTE — Telephone Encounter (Signed)
Spoke with pharmacy - pt has not been taking atorvastatin d/t muscle aches but is willing to try a lower dose or a different statin.   Aware I will have Dr Anne Fu review and send a new order once I have one.  Pharmacy is aware Dr Anne Fu is not in this week.

## 2019-12-06 NOTE — Telephone Encounter (Signed)
Follow Up:    Please call, need to talk about pt's Atorvastatin

## 2019-12-06 NOTE — Telephone Encounter (Signed)
Attempted to contact pharmacy X 3 - line busy.  Will continue to attempt to contact them to discuss.

## 2019-12-06 NOTE — Telephone Encounter (Signed)
Pharmacy is closed for lunch.

## 2019-12-10 MED ORDER — ROSUVASTATIN CALCIUM 10 MG PO TABS: 10.0000 mg | ORAL_TABLET | Freq: Every day | ORAL | 3 refills | Status: DC

## 2019-12-10 NOTE — Telephone Encounter (Signed)
Stop atorvastatin 20 and start Crestor 10 mg po QD. Was having aches on atorvastatin.    Thanks  Donato Schultz MD

## 2019-12-24 DIAGNOSIS — E1169 Type 2 diabetes mellitus with other specified complication: Secondary | ICD-10-CM | POA: Diagnosis not present

## 2019-12-24 DIAGNOSIS — M150 Primary generalized (osteo)arthritis: Secondary | ICD-10-CM | POA: Diagnosis not present

## 2019-12-24 DIAGNOSIS — M0589 Other rheumatoid arthritis with rheumatoid factor of multiple sites: Secondary | ICD-10-CM | POA: Diagnosis not present

## 2019-12-24 DIAGNOSIS — Z79899 Other long term (current) drug therapy: Secondary | ICD-10-CM | POA: Diagnosis not present

## 2019-12-30 DIAGNOSIS — I2510 Atherosclerotic heart disease of native coronary artery without angina pectoris: Secondary | ICD-10-CM | POA: Diagnosis not present

## 2019-12-30 DIAGNOSIS — R809 Proteinuria, unspecified: Secondary | ICD-10-CM | POA: Diagnosis not present

## 2019-12-30 DIAGNOSIS — E0590 Thyrotoxicosis, unspecified without thyrotoxic crisis or storm: Secondary | ICD-10-CM | POA: Diagnosis not present

## 2019-12-30 DIAGNOSIS — E118 Type 2 diabetes mellitus with unspecified complications: Secondary | ICD-10-CM | POA: Diagnosis not present

## 2020-02-03 DIAGNOSIS — E0590 Thyrotoxicosis, unspecified without thyrotoxic crisis or storm: Secondary | ICD-10-CM | POA: Diagnosis not present

## 2020-02-03 DIAGNOSIS — R531 Weakness: Secondary | ICD-10-CM | POA: Diagnosis not present

## 2020-02-03 DIAGNOSIS — B948 Sequelae of other specified infectious and parasitic diseases: Secondary | ICD-10-CM | POA: Diagnosis not present

## 2020-02-03 DIAGNOSIS — E538 Deficiency of other specified B group vitamins: Secondary | ICD-10-CM | POA: Diagnosis not present

## 2020-02-06 NOTE — Progress Notes (Signed)
Cardiology Office Note   Date:  02/07/2020   ID:  William, Hunter 1945/09/04, MRN [id number]  PCP:  Irena Reichmann, DO  Cardiologist:  Dr Anne Fu    No chief complaint on file.     History of Present Illness: William Hunter is a 74 y.o. male who presents for anticoagulation question, coronary spasm  07/06/2019 after COVID-19 acute respiratory failure.  He has a history of rheumatoid arthritis on Humira and sulfasalazine.  Also has diabetes with hypertension.  He had weakness after a fall at home after 2 weeks of fatigue dyspnea nausea vomiting diarrhea mildly hypoxic.  Covid positive.  Remdesivir was provided, [employer].  He has an allergy to prednisone therefore steroids were held.  A filling defect was noted in the left femoral superficial profunda vein on CT of the abdomen pelvis and confirmed left lower extremity DVT.  There is no PE on CT of the chest.  Asymptomatic.  Anticoagulation was started.  Eliquis on discharge.  Physical therapy was provided.   Dr. Anne Fu had seen him last back in 2019-had a myocardial infarction at age 89, heart catheterization in 1981 by Dr. Aleen Campi showing no CAD was diagnosed with vasospasm.  dvt to stop eliquis  Today He was having leg pain with statin.  Stopped lipitor and placed on crestor.  Pain still present so stopped, agreeable to go to lipid clinic.  Still with weakness.  No chest pain, some SOB with exertion. At times he can walk distances and at other times he cannot.   He has used last eliquis, it is costing him $130 per month, last note could stop after 3 months, has been > 6 months.      Past Medical History:  Diagnosis Date  . Anginal pain ([name]   . Arthritis    "fingers" (03/09/2012)  . Cold feet    "from my diabetes; they stay cold" (03/09/2012)  . Coronary artery disease   . Coronary artery spasm [facility])    since age 16 Dr Aleen Campi  . Dyslipidemia   . Headache(784.0)   . Hx of gallstones    Dr Derrell Lolling lap  cholecystectomy  . Hx of plastic surgery    plastic surgery following a fall off a loading dock ,lost  all his upper teeth   . Hypertension   . MI, old    at age 52  . Migraines   . Pneumonia ~ 1962; 1970's   "double"; "regular" (03/09/2012)  . Rheumatoid arthritis ([name]    Dr Dierdre Forth   . Slipped cervical disc 2008   Dr Patsi Sears in the past/ Dr Larita Fife , chiropractor in 2008  . Thyroid disease    Dr Lucianne Muss  . Type II diabetes mellitus ([name]     Past Surgical History:  Procedure Laterality Date  . CARDIAC CATHETERIZATION     x 3 with NO stents   . CHOLECYSTECTOMY  03/11/2012   Procedure: LAPAROSCOPIC CHOLECYSTECTOMY WITH INTRAOPERATIVE CHOLANGIOGRAM;  Surgeon: Ernestene Mention, MD;  Location: [facility] OR;  Service: General;  Laterality: N/A;  . PENILE PROSTHESIS IMPLANT  1983     Current Outpatient Medications  Medication Sig Dispense Refill  . Adalimumab (HUMIRA) 40 MG/0.4ML PSKT Inject 40 mg into the skin every 14 (fourteen) days. Every other Thursday    . benazepril (LOTENSIN) 10 MG tablet TAKE ONE TABLET BY MOUTH TWICE A DAY 180 tablet 2  . diltiazem (TIAZAC) 300 MG 24 hr capsule TAKE ONE CAPSULE BY MOUTH DAILY 90  capsule 3  . empagliflozin (JARDIANCE) 10 MG TABS tablet Take 10 mg by mouth daily.    Marland Kitchen ibuprofen (ADVIL) 200 MG tablet Take 400 mg by mouth daily.    . insulin aspart (NOVOLOG) 100 UNIT/ML injection Inject 20 Units into the skin 3 (three) times daily before meals.    . Insulin Glargine (LANTUS SOLOSTAR) 100 UNIT/ML Solostar Pen Inject 30 Units into the skin at bedtime. 15 mL 11  . loratadine (CLARITIN) 10 MG tablet Take 10 mg by mouth daily.    . metFORMIN (GLUCOPHAGE-XR) 500 MG 24 hr tablet Take 1,000 mg by mouth 2 (two) times daily.    Marland Kitchen OVER THE COUNTER MEDICATION Apply 1 application topically daily. Hemp cream - for pain    . sulfaSALAzine (AZULFIDINE) 500 MG tablet Take 1,500 mg by mouth 2 (two) times daily.    . traZODone (DESYREL) 50 MG tablet Take 1 tablet (50  mg total) by mouth at bedtime as needed for sleep.     No current facility-administered medications for this visit.    Allergies:   Codeine, Penicillins, Prednisone, and Wool alcohol [lanolin]    Social History:  The patient  reports that he has never smoked. He has never used smokeless tobacco. He reports that he does not drink alcohol and does not use drugs.   Family History:  The patient's family history includes Cancer - Other in his mother and sister; Heart attack in his sister; Heart disease in his father; Heart failure in his mother.    ROS:  General:no colds or fevers, no weight changes except with COVID now wt creeping back up.  Skin:no rashes or ulcers HEENT:no blurred vision, no congestion CV:see HPI PUL:see HPI GI:no diarrhea constipation or melena, no indigestion GU:no hematuria, no dysuria MS:no joint pain, no claudication Neuro:no syncope, no lightheadedness Endo:+ diabetes, no thyroid disease  Wt Readings from Last 3 Encounters:  02/07/20 197 lb (89.4 kg)  08/10/19 192 lb (87.1 kg)  07/04/19 216 lb 7.9 oz (98.2 kg)     PHYSICAL EXAM: VS:  BP 122/80   Pulse 97   Ht 6' (1.829 m)   Wt 197 lb (89.4 kg)   SpO2 96%   BMI 26.72 kg/m  , BMI Body mass index is 26.72 kg/m. General:Pleasant affect, NAD Skin:Warm and dry, brisk capillary refill HEENT:normocephalic, sclera clear, mucus membranes moist Neck:supple, no JVD, no bruits  Heart:S1S2 RRR without murmur, gallup, rub or click Lungs:diminished breath sounds, without rales, rhonchi, or wheezes ZOX:WRUE, non tender, + BS, do not palpate liver spleen or masses Ext:no lower ext edema, 2+ pedal pulses, 2+ radial pulses Neuro:alert and oriented X 3, MAE, follows commands, + facial symmetry    EKG:  EKG is NOT ordered today.    Recent Labs: 07/04/2019: Hemoglobin 13.3; Platelets 243 07/05/2019: ALT 16; BUN 19; Creatinine, Ser 0.77; Potassium 3.6; Sodium 135    Lipid Panel    Component Value Date/Time    CHOL 140 09/16/2013 0817   TRIG 106 07/02/2019 2037   HDL 36.50 (L) 09/16/2013 0817   CHOLHDL 4 09/16/2013 0817   VLDL 23.4 09/16/2013 0817   LDLCALC 80 09/16/2013 0817       Other studies Reviewed: Additional studies/ records that were reviewed today include: echo 2018 Study Conclusions   - Left ventricle: The cavity size was normal. Wall thickness was  increased in a pattern of mild LVH. Systolic function was normal.  The estimated ejection fraction was in the range of 55% to  60%.  Wall motion was normal; there were no regional wall motion  abnormalities. Doppler parameters are consistent with abnormal  left ventricular relaxation (grade 1 diastolic dysfunction).  - Aortic valve: Trileaflet; mildly thickened, mildly calcified  leaflets.  - Left atrium: The atrium was mildly dilated.    ASSESSMENT AND PLAN:  1.  anticoagulation for Left femoral DVT seen on Ct plan had been for 3 months now greater than 6 - discussed with Dr. Anne Fu and will stop now.   2.  DOE will check echo.  Could still be deconditioning or long COVID but will check echo.  3.  Hx coronary spasm with MI years ago and cath with normal cors.no chest pain  4.  DM per IM  5.  HLD on statin but he stopped due to leg pain he is willing to go to lipid clinic this may be helpful ws on crestor 10.   Current medicines are reviewed with the patient today.  The patient Has no concerns regarding medicines.  The following changes have been made:  See above Labs/ tests ordered today include:see above  Disposition:   FU:  see above  Signed, Nada Boozer, NP  02/07/2020 10:18 AM    [facility] [address] [place], [place], [place]  [address]/ [address] [place], [place] Phone: [phone]; Fax: [phone]  [phone]

## 2020-02-07 ENCOUNTER — Ambulatory Visit: Payer: Medicare HMO | Admitting: Cardiology

## 2020-02-07 ENCOUNTER — Other Ambulatory Visit: Payer: Self-pay

## 2020-02-07 ENCOUNTER — Encounter: Payer: Self-pay | Admitting: Cardiology

## 2020-02-07 VITALS — BP 122/80 | HR 97 | Ht 72.0 in | Wt 197.0 lb

## 2020-02-07 DIAGNOSIS — R0600 Dyspnea, unspecified: Secondary | ICD-10-CM | POA: Diagnosis not present

## 2020-02-07 DIAGNOSIS — R0609 Other forms of dyspnea: Secondary | ICD-10-CM

## 2020-02-07 DIAGNOSIS — Z7901 Long term (current) use of anticoagulants: Secondary | ICD-10-CM | POA: Diagnosis not present

## 2020-02-07 DIAGNOSIS — I201 Angina pectoris with documented spasm: Secondary | ICD-10-CM | POA: Diagnosis not present

## 2020-02-07 DIAGNOSIS — E785 Hyperlipidemia, unspecified: Secondary | ICD-10-CM | POA: Diagnosis not present

## 2020-02-07 NOTE — Patient Instructions (Addendum)
Medication Instructions:  Your physician has recommended you make the following change in your medication:  1.  STOP the Eliquis  *If you need a refill on your cardiac medications before your next appointment, please call your pharmacy*   Lab Work: None ordered  If you have labs (blood work) drawn today and your tests are completely normal, you will receive your results only by: Marland Kitchen MyChart Message (if you have MyChart) OR . A paper copy in the mail If you have any lab test that is abnormal or we need to change your treatment, we will call you to review the results.   Testing/Procedures: Your physician has requested that you have an echocardiogram. Echocardiography is a painless test that uses sound waves to create images of your heart. It provides your doctor with information about the size and shape of your heart and how well your heart's chambers and valves are working. This procedure takes approximately one hour. There are no restrictions for this procedure.    You have been referred to LIpid Clinc to discuss other options for Cholesterol treatment   Follow-Up: At [facility], you and your health needs are our priority.  As part of our continuing mission to provide you with exceptional heart care, we have created designated Provider Care Teams.  These Care Teams include your primary Cardiologist (physician) and Advanced Practice Providers (APPs -  Physician Assistants and Nurse Practitioners) who all work together to provide you with the care you need, when you need it.  We recommend signing up for the patient portal called "MyChart".  Sign up information is provided on this After Visit Summary.  MyChart is used to connect with patients for Virtual Visits (Telemedicine).  Patients are able to view lab/test results, encounter notes, upcoming appointments, etc.  Non-urgent messages can be sent to your provider as well.   To learn more about what you can do with MyChart, go to  [email or web address].    Your next appointment:   6 month(s)  The format for your next appointment:   In Person  Provider:   You may see Donato Schultz, MD or one of the following Advanced Practice Providers on your designated Care Team:    Norma Fredrickson, NP  Nada Boozer, NP  Georgie Chard, NP    Other Instructions  Echocardiogram An echocardiogram is a procedure that uses painless sound waves (ultrasound) to produce an image of the heart. Images from an echocardiogram can provide important information about:  Signs of coronary artery disease (CAD).  Aneurysm detection. An aneurysm is a weak or damaged part of an artery wall that bulges out from the normal force of blood pumping through the body.  Heart size and shape. Changes in the size or shape of the heart can be associated with certain conditions, including heart failure, aneurysm, and CAD.  Heart muscle function.  Heart valve function.  Signs of a past heart attack.  Fluid buildup around the heart.  Thickening of the heart muscle.  A tumor or infectious growth around the heart valves. Tell a health care provider about:  Any allergies you have.  All medicines you are taking, including vitamins, herbs, eye drops, creams, and over-the-counter medicines.  Any blood disorders you have.  Any surgeries you have had.  Any medical conditions you have.  Whether you are pregnant or may be pregnant. What are the risks? Generally, this is a safe procedure. However, problems may occur, including:  Allergic reaction to dye (contrast) that may be  used during the procedure. What happens before the procedure? No specific preparation is needed. You may eat and drink normally. What happens during the procedure?   An IV tube may be inserted into one of your veins.  You may receive contrast through this tube. A contrast is an injection that improves the quality of the pictures from your heart.  A gel will be  applied to your chest.  A wand-like tool (transducer) will be moved over your chest. The gel will help to transmit the sound waves from the transducer.  The sound waves will harmlessly bounce off of your heart to allow the heart images to be captured in real-time motion. The images will be recorded on a computer. The procedure may vary among health care providers and hospitals. What happens after the procedure?  You may return to your normal, everyday life, including diet, activities, and medicines, unless your health care provider tells you not to do that. Summary  An echocardiogram is a procedure that uses painless sound waves (ultrasound) to produce an image of the heart.  Images from an echocardiogram can provide important information about the size and shape of your heart, heart muscle function, heart valve function, and fluid buildup around your heart.  You do not need to do anything to prepare before this procedure. You may eat and drink normally.  After the echocardiogram is completed, you may return to your normal, everyday life, unless your health care provider tells you not to do that. This information is not intended to replace advice given to you by your health care provider. Make sure you discuss any questions you have with your health care provider. Document Revised: 09/17/2018 Document Reviewed: 06/29/2016 Elsevier Patient Education  2020 [employer].

## 2020-02-10 DIAGNOSIS — R809 Proteinuria, unspecified: Secondary | ICD-10-CM | POA: Diagnosis not present

## 2020-02-10 DIAGNOSIS — I2510 Atherosclerotic heart disease of native coronary artery without angina pectoris: Secondary | ICD-10-CM | POA: Diagnosis not present

## 2020-02-10 DIAGNOSIS — E0590 Thyrotoxicosis, unspecified without thyrotoxic crisis or storm: Secondary | ICD-10-CM | POA: Diagnosis not present

## 2020-02-10 DIAGNOSIS — E118 Type 2 diabetes mellitus with unspecified complications: Secondary | ICD-10-CM | POA: Diagnosis not present

## 2020-02-11 DIAGNOSIS — M25561 Pain in right knee: Secondary | ICD-10-CM | POA: Diagnosis not present

## 2020-02-11 DIAGNOSIS — G8929 Other chronic pain: Secondary | ICD-10-CM | POA: Diagnosis not present

## 2020-02-11 DIAGNOSIS — M25562 Pain in left knee: Secondary | ICD-10-CM | POA: Diagnosis not present

## 2020-02-21 NOTE — Progress Notes (Addendum)
Patient ID: William Hunter                 DOB: Mar 05, 1946                    MRN: [id number]     HPI: William Hunter is a 74 y.o. male patient referred to lipid clinic by Dr. Anne Fu. PMH is significant for CAD, HTN, HLD, MI (age 3), coronary spasm, migraines, T2DM, RA.  Patient presents today in good spirits. Reports not currently on any lipid-lowering medications. Reports he has tried multiple statins in the past which caused myalgias, swelling, and "felt sick like a dog." Exercise is limited due to arthritis in both knees. Additionally, it appears patient is in the donut hole as tier 3 medications are $125 per month.   Current Medications: none Intolerances: atorvastatin 20mg , 40mg  daily (myalgias), lovastatin 10 mg daily, rosuvastatin 10 mg daily (myalgias), Welchol 625mg , 1250mg  daily (swelling) Risk Factors: CAD, HTN, T2DM, HLD, Fhx of heart disease LDL goal: < 55 mg/dL  Diet:  Breakfast: homemade waffles, eggs and bacons occasionally Lunch: ham sandwich, limits salts Dinner: vegetables, steak and hamburger occassionally, beef hot dogs Drinks: unsweetened tea, diet coke, water, milk  Exercise: limited due to arthritis in both knees  Family History: Heart attack in his sister; Heart disease in his father; Heart failure in his mother.   Social History: denies tobacco, alcohol and illicit drug abuse  Labs: 08/02/19: LDL 140, TG 198, TC 224, HDL 44 ([name]) [no therapy] 09/16/2013: LDL 80, TC 140, TG 117, HDL 36.50  Past Medical History:  Diagnosis Date  . Anginal pain ([name]   . Arthritis    "fingers" (03/09/2012)  . Cold feet    "from my diabetes; they stay cold" (03/09/2012)  . Coronary artery disease   . Coronary artery spasm [facility])    since age 63 Dr 03/11/2012  . Dyslipidemia   . Headache(784.0)   . Hx of gallstones    Dr 03/11/2012 lap cholecystectomy  . Hx of plastic surgery    plastic surgery following a fall off a loading dock ,lost  all his upper teeth   .  Hypertension   . MI, old    at age 24  . Migraines   . Pneumonia ~ 1962; 1970's   "double"; "regular" (03/09/2012)  . Rheumatoid arthritis ([name]    Dr Derrell Lolling   . Slipped cervical disc 2008   Dr 1963 in the past/ Dr 03/11/2012 , chiropractor in 2008  . Thyroid disease    Dr 2009  . Type II diabetes mellitus ([name]     Current Outpatient Medications on File Prior to Visit  Medication Sig Dispense Refill  . Adalimumab (HUMIRA) 40 MG/0.4ML PSKT Inject 40 mg into the skin every 14 (fourteen) days. Every other Thursday    . benazepril (LOTENSIN) 10 MG tablet TAKE ONE TABLET BY MOUTH TWICE A DAY 180 tablet 2  . diltiazem (TIAZAC) 300 MG 24 hr capsule TAKE ONE CAPSULE BY MOUTH DAILY 90 capsule 3  . empagliflozin (JARDIANCE) 10 MG TABS tablet Take 10 mg by mouth daily.    Larita Fife ibuprofen (ADVIL) 200 MG tablet Take 400 mg by mouth daily.    . insulin aspart (NOVOLOG) 100 UNIT/ML injection Inject 20 Units into the skin 3 (three) times daily before meals.    . Insulin Glargine (LANTUS SOLOSTAR) 100 UNIT/ML Solostar Pen Inject 30 Units into the skin at bedtime. 15 mL 11  . loratadine (  CLARITIN) 10 MG tablet Take 10 mg by mouth daily.    . metFORMIN (GLUCOPHAGE-XR) 500 MG 24 hr tablet Take 1,000 mg by mouth 2 (two) times daily.    Marland Kitchen OVER THE COUNTER MEDICATION Apply 1 application topically daily. Hemp cream - for pain    . sulfaSALAzine (AZULFIDINE) 500 MG tablet Take 1,500 mg by mouth 2 (two) times daily.    . traZODone (DESYREL) 50 MG tablet Take 1 tablet (50 mg total) by mouth at bedtime as needed for sleep.     No current facility-administered medications on file prior to visit.    Allergies  Allergen Reactions  . Codeine Swelling  . Penicillins Hives and Swelling    Has patient had a PCN reaction causing immediate rash, facial/tongue/throat swelling, SOB or lightheadedness with hypotension: Yes Has patient had a PCN reaction causing severe rash involving mucus membranes or skin necrosis:  No Has patient had a PCN reaction that required hospitalization No Has patient had a PCN reaction occurring within the last 10 years: No If all of the above answers are "NO", then may proceed with Cephalosporin use.   . Prednisone Hives  . Wool Alcohol [Lanolin] Hives    Assessment/Plan:  1. Hyperlipidemia - LDL is above goal < 55 mg/dL due to history of MI at age 74 and CAD. Patient is not on any lipid-lowering medications due to multiple prior statin intolerances. Approved for PCSK9-inhibitor, however copay cost is prohibitive as cost is $125 per month and is in the donut hole. Unfortunately, the [employer] is currently closed. Discussed enrolling in the [place] 4 clinical trial which is investigating Inclisiran. Patient is interested in learning more. Will have research team reach out to patient to discuss enrollment. Discussed healthy eating options such as a diet full of vegetables, fruit and lean meats (chicken, [place], fish) and to limit carbs (bread, pasta, sugar, rice) and red meat consumption. Encouraged patient to exercise to the best of his abilities.   Fabio Neighbors, PharmD PGY2 Ambulatory Care Pharmacy Resident [facility] [address] [place], [place] [address] Phone: [phone]; Fax: [phone]

## 2020-02-22 ENCOUNTER — Ambulatory Visit (HOSPITAL_COMMUNITY): Payer: Medicare HMO | Attending: Cardiovascular Disease

## 2020-02-22 ENCOUNTER — Ambulatory Visit (INDEPENDENT_AMBULATORY_CARE_PROVIDER_SITE_OTHER): Payer: Medicare HMO | Admitting: Pharmacist

## 2020-02-22 ENCOUNTER — Other Ambulatory Visit: Payer: Self-pay

## 2020-02-22 DIAGNOSIS — I2583 Coronary atherosclerosis due to lipid rich plaque: Secondary | ICD-10-CM

## 2020-02-22 DIAGNOSIS — T466X5A Adverse effect of antihyperlipidemic and antiarteriosclerotic drugs, initial encounter: Secondary | ICD-10-CM

## 2020-02-22 DIAGNOSIS — R0600 Dyspnea, unspecified: Secondary | ICD-10-CM

## 2020-02-22 DIAGNOSIS — G720 Drug-induced myopathy: Secondary | ICD-10-CM | POA: Diagnosis not present

## 2020-02-22 DIAGNOSIS — R0609 Other forms of dyspnea: Secondary | ICD-10-CM

## 2020-02-22 DIAGNOSIS — E785 Hyperlipidemia, unspecified: Secondary | ICD-10-CM

## 2020-02-22 DIAGNOSIS — I2510 Atherosclerotic heart disease of native coronary artery without angina pectoris: Secondary | ICD-10-CM

## 2020-02-22 LAB — ECHOCARDIOGRAM COMPLETE
Area-P 1/2: 2.77 cm2
S' Lateral: 2.5 cm

## 2020-02-22 MED ORDER — REPATHA SURECLICK 140 MG/ML ~~LOC~~ SOAJ: 140.0000 mg | SUBCUTANEOUS | 11 refills | Status: DC

## 2020-02-22 NOTE — Patient Instructions (Addendum)
Nice to see you today!  Keep up the good work with diet. Aim for a diet full of vegetables, fruit and lean meats (chicken, [place], fish). Try to limit carbs (bread, pasta, sugar, rice) and red meat consumption.  Your goal LDL is <55 mg/dL, you're currently at 943 mg/dL  Medication Changes: Once Repatha is approved, we will give you a call when the medication is ready for pick up at your pharmacy. Then you will inject Repatha 140 mg subcutaneously into your lower abdomen every 2 weeks.  To apply for patient assistance, we will need the information below. Give [place] a call at the number below -Household annual income = -Household number = -Social security =   Please give [place] a call at [phone] with any questions or concerns.  For PCSK9i, inject once every other week (any day of the week that works for you) into the fatty skin of stomach, upper outer thigh or back of the arm. Clean the site with soap and warm water or an alcohol pad. Keep the medication in the fridge until you are ready to give your dose, then take it out and let warm up to room temperature for 30-60 mins.

## 2020-03-08 ENCOUNTER — Encounter: Payer: Medicare HMO | Admitting: *Deleted

## 2020-03-08 ENCOUNTER — Other Ambulatory Visit: Payer: Self-pay

## 2020-03-08 DIAGNOSIS — Z006 Encounter for examination for normal comparison and control in clinical research program: Secondary | ICD-10-CM

## 2020-03-08 NOTE — Research (Signed)
Subject Name: William Hunter  Subject met inclusion and exclusion criteria.  The informed consent form, study requirements and expectations were reviewed with the subject and questions and concerns were addressed prior to the signing of the consent form.  The subject verbalized understanding of the trial requirements.  The subject agreed to participate in the ORION 4 trial and signed the informed consent at 1333 on 03/08/20  The informed consent was obtained prior to performance of any protocol-specific procedures for the subject.  A copy of the signed informed consent was given to the subject and a copy was placed in the subject's medical record.   [name]    Participant came to research clinic for [employer]. Subject's total cholesterol was 221 mg/dL. Injection was given in right abdomen and patient tolerated it well. Next appointment schedule for November 16th, 2021 at 9:00 am.

## 2020-03-14 DIAGNOSIS — I10 Essential (primary) hypertension: Secondary | ICD-10-CM | POA: Diagnosis not present

## 2020-03-14 DIAGNOSIS — E0590 Thyrotoxicosis, unspecified without thyrotoxic crisis or storm: Secondary | ICD-10-CM | POA: Diagnosis not present

## 2020-03-14 DIAGNOSIS — E118 Type 2 diabetes mellitus with unspecified complications: Secondary | ICD-10-CM | POA: Diagnosis not present

## 2020-04-06 DIAGNOSIS — M25561 Pain in right knee: Secondary | ICD-10-CM | POA: Diagnosis not present

## 2020-04-06 DIAGNOSIS — M1712 Unilateral primary osteoarthritis, left knee: Secondary | ICD-10-CM | POA: Diagnosis not present

## 2020-04-24 DIAGNOSIS — E118 Type 2 diabetes mellitus with unspecified complications: Secondary | ICD-10-CM | POA: Diagnosis not present

## 2020-04-24 DIAGNOSIS — E0590 Thyrotoxicosis, unspecified without thyrotoxic crisis or storm: Secondary | ICD-10-CM | POA: Diagnosis not present

## 2020-04-25 ENCOUNTER — Other Ambulatory Visit: Payer: Self-pay

## 2020-04-25 ENCOUNTER — Encounter: Payer: Medicare HMO | Admitting: *Deleted

## 2020-04-25 DIAGNOSIS — Z006 Encounter for examination for normal comparison and control in clinical research program: Secondary | ICD-10-CM

## 2020-04-25 NOTE — Research (Addendum)
Subject came to research clinic today for ORION 4 Randomization Visit. Subject was successfully randomized and IP was given subcutaneously into the left abdomen, subject tolerated well. Next appointment scheduled for Wednesday, February 16th, 2022 at 0900.

## 2020-05-02 DIAGNOSIS — E0590 Thyrotoxicosis, unspecified without thyrotoxic crisis or storm: Secondary | ICD-10-CM | POA: Diagnosis not present

## 2020-05-11 DIAGNOSIS — H52203 Unspecified astigmatism, bilateral: Secondary | ICD-10-CM | POA: Diagnosis not present

## 2020-05-11 DIAGNOSIS — E119 Type 2 diabetes mellitus without complications: Secondary | ICD-10-CM | POA: Diagnosis not present

## 2020-05-11 DIAGNOSIS — H25813 Combined forms of age-related cataract, bilateral: Secondary | ICD-10-CM | POA: Diagnosis not present

## 2020-07-14 DIAGNOSIS — H2513 Age-related nuclear cataract, bilateral: Secondary | ICD-10-CM | POA: Diagnosis not present

## 2020-07-14 DIAGNOSIS — H25013 Cortical age-related cataract, bilateral: Secondary | ICD-10-CM | POA: Diagnosis not present

## 2020-07-26 ENCOUNTER — Encounter: Payer: Medicare HMO | Admitting: *Deleted

## 2020-07-26 ENCOUNTER — Other Ambulatory Visit: Payer: Self-pay

## 2020-07-26 DIAGNOSIS — Z006 Encounter for examination for normal comparison and control in clinical research program: Secondary | ICD-10-CM

## 2020-07-26 NOTE — Research (Signed)
Pt to research clinic for Orion 4 visit 3. Pt doing well. No Ae/ASE or medication changes to report. Injection given in left ABD. Tolerated well. Next appointment scheduled for 01/22/2021 at 0900.

## 2020-07-26 NOTE — Research (Signed)
Re-Consent to [facility] 4  Version 05 Jul 2020  Subject Name: William Hunter  Subject met inclusion and exclusion criteria.  The informed consent form, study requirements and expectations were reviewed with the subject and questions and concerns were addressed prior to the signing of the consent form.  The subject verbalized understanding of the trial requirements.  The subject agreed to participate in the [place] 4 trial and signed the informed consent at 0858 am on 07/26/20  The informed consent was obtained prior to performance of any protocol-specific procedures for the subject.  A copy of the signed informed consent was given to the subject and a copy was placed in the subject's medical record.   William Hunter

## 2020-08-07 ENCOUNTER — Ambulatory Visit: Payer: Medicare HMO | Admitting: Cardiology

## 2020-08-08 ENCOUNTER — Ambulatory Visit: Payer: Medicare HMO | Admitting: Cardiology

## 2020-08-08 ENCOUNTER — Other Ambulatory Visit: Payer: Self-pay

## 2020-08-08 ENCOUNTER — Encounter: Payer: Self-pay | Admitting: Cardiology

## 2020-08-08 VITALS — BP 160/80 | HR 94 | Ht 72.0 in | Wt 196.0 lb

## 2020-08-08 DIAGNOSIS — E785 Hyperlipidemia, unspecified: Secondary | ICD-10-CM | POA: Diagnosis not present

## 2020-08-08 DIAGNOSIS — I2510 Atherosclerotic heart disease of native coronary artery without angina pectoris: Secondary | ICD-10-CM | POA: Diagnosis not present

## 2020-08-08 DIAGNOSIS — I2583 Coronary atherosclerosis due to lipid rich plaque: Secondary | ICD-10-CM

## 2020-08-08 NOTE — Addendum Note (Signed)
Addended by: Sharin Grave on: 08/08/2020 02:23 PM   Modules accepted: Orders

## 2020-08-08 NOTE — Progress Notes (Signed)
Cardiology Office Note:    Date:  08/08/2020   ID:  William Hunter, DOB Feb 05, 1946, MRN [id number]  PCP:  Irena Reichmann, DO   [facility]  Cardiologist:  Donato Schultz, MD  Advanced Practice Provider:  No care team member to display Electrophysiologist:  None      Referring MD: Irena Reichmann, DO     History of Present Illness:    William Hunter is a 75 y.o. male here for the follow-up of shortness of breath.  Prior hospitalization on 07/06/2019 after COVID-19.  Respiratory failure.  Rheumatoid arthritis.    Used to see Dr. Aleen Campi many years ago.  Had a heart catheterization in 1981 with no CAD.  He had a myocardial infarction at age 24.  Unfortunately he fell at [facility] in [place] while trying to handle a 50 pound bag of soil that slipped in his hands.  He went over his grocery cart and fell on his face/right side of body.  Denies any chest pain, shortness of breath.  His daughter recently got her PA degree.  Past Medical History:  Diagnosis Date  . Anginal pain ([name]   . Arthritis    "fingers" (03/09/2012)  . Cold feet    "from my diabetes; they stay cold" (03/09/2012)  . Coronary artery disease   . Coronary artery spasm [facility])    since age 35 Dr Aleen Campi  . Dyslipidemia   . Headache(784.0)   . Hx of gallstones    Dr Derrell Lolling lap cholecystectomy  . Hx of plastic surgery    plastic surgery following a fall off a loading dock ,lost  all his upper teeth   . Hypertension   . MI, old    at age 60  . Migraines   . Pneumonia ~ 1962; 1970's   "double"; "regular" (03/09/2012)  . Rheumatoid arthritis ([name]    Dr Dierdre Forth   . Slipped cervical disc 2008   Dr Patsi Sears in the past/ Dr Larita Fife , chiropractor in 2008  . Thyroid disease    Dr Lucianne Muss  . Type II diabetes mellitus ([name]     Past Surgical History:  Procedure Laterality Date  . CARDIAC CATHETERIZATION     x 3 with NO stents   . CHOLECYSTECTOMY  03/11/2012   Procedure: LAPAROSCOPIC  CHOLECYSTECTOMY WITH INTRAOPERATIVE CHOLANGIOGRAM;  Surgeon: Ernestene Mention, MD;  Location: [facility] OR;  Service: General;  Laterality: N/A;  . PENILE PROSTHESIS IMPLANT  1983    Current Medications: Current Meds  Medication Sig  . Adalimumab (HUMIRA) 40 MG/0.4ML PSKT Inject 40 mg into the skin every 14 (fourteen) days. Every other Thursday  . diltiazem (TIAZAC) 300 MG 24 hr capsule TAKE ONE CAPSULE BY MOUTH DAILY  . Evolocumab (REPATHA SURECLICK) 140 MG/ML SOAJ Inject 140 mg into the skin every 14 (fourteen) days.  Marland Kitchen ibuprofen (ADVIL) 200 MG tablet Take 400 mg by mouth daily.  . insulin aspart (NOVOLOG) 100 UNIT/ML injection Inject 20 Units into the skin 3 (three) times daily before meals.  . Insulin Glargine (LANTUS SOLOSTAR) 100 UNIT/ML Solostar Pen Inject 30 Units into the skin at bedtime.  Marland Kitchen loratadine (CLARITIN) 10 MG tablet Take 10 mg by mouth daily.  . metFORMIN (GLUCOPHAGE-XR) 500 MG 24 hr tablet Take 1,000 mg by mouth 2 (two) times daily.  Marland Kitchen OVER THE COUNTER MEDICATION Apply 1 application topically daily. Hemp cream - for pain  . sulfaSALAzine (AZULFIDINE) 500 MG tablet Take 1,500 mg by mouth 2 (two) times  daily.  . traZODone (DESYREL) 50 MG tablet Take 1 tablet (50 mg total) by mouth at bedtime as needed for sleep.     Allergies:   Codeine, Penicillins, Prednisone, and Wool alcohol [lanolin]   Social History   Socioeconomic History  . Marital status: Married    Spouse name: Talbert Forest  . Number of children: 0  . Years of education: 82  . Highest education level: Not on file  Occupational History    Comment: [employer]  Tobacco Use  . Smoking status: Never Smoker  . Smokeless tobacco: Never Used  Substance and Sexual Activity  . Alcohol use: No  . Drug use: No  . Sexual activity: Yes  Other Topics Concern  . Not on file  Social History Narrative   Lives with wife   Caffeine- Diet Coke 5 daily   Social Determinants of Health   Financial Resource Strain: Not on  file  Food Insecurity: Not on file  Transportation Needs: Not on file  Physical Activity: Not on file  Stress: Not on file  Social Connections: Not on file     Family History: The patient's family history includes Cancer - Other in his mother and sister; Heart attack in his sister; Heart disease in his father; Heart failure in his mother.  ROS:   Please see the history of present illness.     All other systems reviewed and are negative.  EKGs/Labs/Other Studies Reviewed:    Recent Labs: No results found for requested labs within last 8760 hours.  Recent Lipid Panel    Component Value Date/Time   CHOL 140 09/16/2013 0817   TRIG 106 07/02/2019 2037   HDL 36.50 (L) 09/16/2013 0817   CHOLHDL 4 09/16/2013 0817   VLDL 23.4 09/16/2013 0817   LDLCALC 80 09/16/2013 0817     Risk Assessment/Calculations:      Physical Exam:    VS:  BP (!) 160/80 (BP Location: Left Arm, Patient Position: Sitting, Cuff Size: Normal)   Pulse 94   Ht 6' (1.829 m)   Wt 196 lb (88.9 kg)   SpO2 97%   BMI 26.58 kg/m     Wt Readings from Last 3 Encounters:  08/08/20 196 lb (88.9 kg)  02/07/20 197 lb (89.4 kg)  08/10/19 192 lb (87.1 kg)     GEN:  Well nourished, well developed in no acute distress HEENT: Right forehead injury NECK: No JVD; No carotid bruits LYMPHATICS: No lymphadenopathy CARDIAC: RRR, no murmurs, rubs, gallops RESPIRATORY:  Clear to auscultation without rales, wheezing or rhonchi  ABDOMEN: Soft, non-tender, non-distended MUSCULOSKELETAL:  No edema; No deformity  SKIN: Warm and dry NEUROLOGIC:  Alert and oriented x 3 PSYCHIATRIC:  Normal affect   ASSESSMENT:    1. Coronary artery disease due to lipid rich plaque   2. Dyslipidemia    PLAN:    In order of problems listed above:  Prior myocardial infarction in his 30s -Could have been type II in the setting of vasospasm.  Nuclear stress test in 2013 with no ischemia.  No CAD was noted on cardiac cath.  Diabetes with  hypertension and hyperlipidemia -LDL goal less than 70.  At last check it was 140.  LDL previously was 76.  He struggled with atorvastatin 20.  Repatha over the past 8 months.  He is in a study at [facility]. -I will go ahead and check lipid panel, curious.  Prior left femoral DVT -He was on apixaban for 3 months.  Possibly Covid  related.  Rheumatoid arthritis -On Humira.   Medication Adjustments/Labs and Tests Ordered: Current medicines are reviewed at length with the patient today.  Concerns regarding medicines are outlined above.  No orders of the defined types were placed in this encounter.  No orders of the defined types were placed in this encounter.   Patient Instructions  Medication Instructions:  The current medical regimen is effective;  continue present plan and medications.  *If you need a refill on your cardiac medications before your next appointment, please call your pharmacy*  Lab Work: Please have blood work today (Lipid panel)  If you have labs (blood work) drawn today and your tests are completely normal, you will receive your results only by: Marland Kitchen MyChart Message (if you have MyChart) OR . A paper copy in the mail If you have any lab test that is abnormal or we need to change your treatment, we will call you to review the results.  Follow-Up: At [facility], you and your health needs are our priority.  As part of our continuing mission to provide you with exceptional heart care, we have created designated Provider Care Teams.  These Care Teams include your primary Cardiologist (physician) and Advanced Practice Providers (APPs -  Physician Assistants and Nurse Practitioners) who all work together to provide you with the care you need, when you need it.  We recommend signing up for the patient portal called "MyChart".  Sign up information is provided on this After Visit Summary.  MyChart is used to connect with patients for Virtual Visits (Telemedicine).  Patients are  able to view lab/test results, encounter notes, upcoming appointments, etc.  Non-urgent messages can be sent to your provider as well.   To learn more about what you can do with MyChart, go to [email or web address].    Your next appointment:   12 month(s)  The format for your next appointment:   In Person  Provider:   Donato Schultz, MD   Thank you for choosing [facility]!!        Signed, Donato Schultz, MD  08/08/2020 2:15 PM    [facility]

## 2020-08-08 NOTE — Patient Instructions (Signed)
Medication Instructions:  The current medical regimen is effective;  continue present plan and medications.  *If you need a refill on your cardiac medications before your next appointment, please call your pharmacy*  Lab Work: Please have blood work today (Lipid panel)  If you have labs (blood work) drawn today and your tests are completely normal, you will receive your results only by: Marland Kitchen MyChart Message (if you have MyChart) OR . A paper copy in the mail If you have any lab test that is abnormal or we need to change your treatment, we will call you to review the results.  Follow-Up: At [facility], you and your health needs are our priority.  As part of our continuing mission to provide you with exceptional heart care, we have created designated Provider Care Teams.  These Care Teams include your primary Cardiologist (physician) and Advanced Practice Providers (APPs -  Physician Assistants and Nurse Practitioners) who all work together to provide you with the care you need, when you need it.  We recommend signing up for the patient portal called "MyChart".  Sign up information is provided on this After Visit Summary.  MyChart is used to connect with patients for Virtual Visits (Telemedicine).  Patients are able to view lab/test results, encounter notes, upcoming appointments, etc.  Non-urgent messages can be sent to your provider as well.   To learn more about what you can do with MyChart, go to [email or web address].    Your next appointment:   12 month(s)  The format for your next appointment:   In Person  Provider:   Donato Schultz, MD   Thank you for choosing [facility]!!

## 2020-08-09 ENCOUNTER — Telehealth: Payer: Self-pay | Admitting: Pharmacist

## 2020-08-09 LAB — LIPID PANEL
Chol/HDL Ratio: 8 ratio — ABNORMAL HIGH (ref 0.0–5.0)
Cholesterol, Total: 248 mg/dL — ABNORMAL HIGH (ref 100–199)
HDL: 31 mg/dL — ABNORMAL LOW (ref >39)
LDL Chol Calc (NIH): 139 mg/dL — ABNORMAL HIGH (ref 0–99)
Triglycerides: 426 mg/dL — ABNORMAL HIGH (ref 0–149)
VLDL Cholesterol Cal: 78 mg/dL — ABNORMAL HIGH (ref 5–40)

## 2020-08-09 MED ORDER — REPATHA SURECLICK 140 MG/ML ~~LOC~~ SOAJ: 1.0000 "pen " | SUBCUTANEOUS | 11 refills | Status: DC

## 2020-08-09 NOTE — Addendum Note (Signed)
Addended by: Pascale Maves E on: 08/09/2020 02:02 PM   Modules accepted: Orders

## 2020-08-09 NOTE — Telephone Encounter (Addendum)
Spoke with pt. He was not fasting when labs were drawn which likely explains the rise in his TG (previously well controlled ~100 last year). Pt had eaten 2 gravy biscuits prior to lab draw. He is currently enrolled in the ORION 4 trial investigating CV outcomes of inclisiran. He is intolerant to 4 statins. Seen in lipid clinic last fall but PCSK9i therapy was cost prohibitive. William Hunter is now available for Repatha, however spoke with research team who stated that first injection is placebo for everyone and 2nd dose is not full dose therapy so we wouldn't expect lipids to decrease much yet. Discussed with Dr Anne Fu who is in agreement with pt continuing in ORION-4 trial at this time.

## 2020-08-09 NOTE — Addendum Note (Signed)
Addended by: Francoise Chojnowski E on: 08/09/2020 04:20 PM   Modules accepted: Orders

## 2020-08-10 NOTE — Progress Notes (Signed)
Discussed with Wallace Cullens, MD

## 2020-08-16 DIAGNOSIS — M170 Bilateral primary osteoarthritis of knee: Secondary | ICD-10-CM | POA: Diagnosis not present

## 2020-08-16 DIAGNOSIS — M2550 Pain in unspecified joint: Secondary | ICD-10-CM | POA: Diagnosis not present

## 2020-08-16 DIAGNOSIS — M069 Rheumatoid arthritis, unspecified: Secondary | ICD-10-CM | POA: Diagnosis not present

## 2020-08-22 DIAGNOSIS — H25811 Combined forms of age-related cataract, right eye: Secondary | ICD-10-CM | POA: Diagnosis not present

## 2020-08-22 DIAGNOSIS — H2511 Age-related nuclear cataract, right eye: Secondary | ICD-10-CM | POA: Diagnosis not present

## 2020-08-22 DIAGNOSIS — H25011 Cortical age-related cataract, right eye: Secondary | ICD-10-CM | POA: Diagnosis not present

## 2020-08-30 DIAGNOSIS — M170 Bilateral primary osteoarthritis of knee: Secondary | ICD-10-CM | POA: Diagnosis not present

## 2020-09-26 DIAGNOSIS — E118 Type 2 diabetes mellitus with unspecified complications: Secondary | ICD-10-CM | POA: Diagnosis not present

## 2020-09-26 DIAGNOSIS — Z125 Encounter for screening for malignant neoplasm of prostate: Secondary | ICD-10-CM | POA: Diagnosis not present

## 2020-09-26 DIAGNOSIS — E559 Vitamin D deficiency, unspecified: Secondary | ICD-10-CM | POA: Diagnosis not present

## 2020-09-26 DIAGNOSIS — I10 Essential (primary) hypertension: Secondary | ICD-10-CM | POA: Diagnosis not present

## 2020-09-26 DIAGNOSIS — E7800 Pure hypercholesterolemia, unspecified: Secondary | ICD-10-CM | POA: Diagnosis not present

## 2020-10-03 DIAGNOSIS — E118 Type 2 diabetes mellitus with unspecified complications: Secondary | ICD-10-CM | POA: Diagnosis not present

## 2020-10-03 DIAGNOSIS — E7800 Pure hypercholesterolemia, unspecified: Secondary | ICD-10-CM | POA: Diagnosis not present

## 2020-10-03 DIAGNOSIS — Z0000 Encounter for general adult medical examination without abnormal findings: Secondary | ICD-10-CM | POA: Diagnosis not present

## 2020-10-03 DIAGNOSIS — E1143 Type 2 diabetes mellitus with diabetic autonomic (poly)neuropathy: Secondary | ICD-10-CM | POA: Diagnosis not present

## 2020-10-18 ENCOUNTER — Other Ambulatory Visit: Payer: Self-pay | Admitting: *Deleted

## 2020-10-18 MED ORDER — DILTIAZEM HCL ER BEADS 300 MG PO CP24: 300.0000 mg | ORAL_CAPSULE | Freq: Every day | ORAL | 3 refills | Status: DC

## 2020-10-19 DIAGNOSIS — I2510 Atherosclerotic heart disease of native coronary artery without angina pectoris: Secondary | ICD-10-CM | POA: Diagnosis not present

## 2020-10-19 DIAGNOSIS — R809 Proteinuria, unspecified: Secondary | ICD-10-CM | POA: Diagnosis not present

## 2020-10-19 DIAGNOSIS — E0590 Thyrotoxicosis, unspecified without thyrotoxic crisis or storm: Secondary | ICD-10-CM | POA: Diagnosis not present

## 2020-10-19 DIAGNOSIS — E118 Type 2 diabetes mellitus with unspecified complications: Secondary | ICD-10-CM | POA: Diagnosis not present

## 2020-11-20 DIAGNOSIS — I509 Heart failure, unspecified: Secondary | ICD-10-CM | POA: Diagnosis not present

## 2020-11-20 DIAGNOSIS — M2550 Pain in unspecified joint: Secondary | ICD-10-CM | POA: Diagnosis not present

## 2020-11-20 DIAGNOSIS — M0609 Rheumatoid arthritis without rheumatoid factor, multiple sites: Secondary | ICD-10-CM | POA: Diagnosis not present

## 2020-11-20 DIAGNOSIS — M170 Bilateral primary osteoarthritis of knee: Secondary | ICD-10-CM | POA: Diagnosis not present

## 2020-11-20 DIAGNOSIS — Z79899 Other long term (current) drug therapy: Secondary | ICD-10-CM | POA: Diagnosis not present

## 2020-11-21 DIAGNOSIS — M9904 Segmental and somatic dysfunction of sacral region: Secondary | ICD-10-CM | POA: Diagnosis not present

## 2020-11-21 DIAGNOSIS — M9903 Segmental and somatic dysfunction of lumbar region: Secondary | ICD-10-CM | POA: Diagnosis not present

## 2020-11-21 DIAGNOSIS — E118 Type 2 diabetes mellitus with unspecified complications: Secondary | ICD-10-CM | POA: Diagnosis not present

## 2020-11-21 DIAGNOSIS — I2510 Atherosclerotic heart disease of native coronary artery without angina pectoris: Secondary | ICD-10-CM | POA: Diagnosis not present

## 2020-11-21 DIAGNOSIS — M9905 Segmental and somatic dysfunction of pelvic region: Secondary | ICD-10-CM | POA: Diagnosis not present

## 2020-11-21 DIAGNOSIS — E0590 Thyrotoxicosis, unspecified without thyrotoxic crisis or storm: Secondary | ICD-10-CM | POA: Diagnosis not present

## 2020-11-21 DIAGNOSIS — R809 Proteinuria, unspecified: Secondary | ICD-10-CM | POA: Diagnosis not present

## 2020-11-21 DIAGNOSIS — M5136 Other intervertebral disc degeneration, lumbar region: Secondary | ICD-10-CM | POA: Diagnosis not present

## 2020-11-28 DIAGNOSIS — M5136 Other intervertebral disc degeneration, lumbar region: Secondary | ICD-10-CM | POA: Diagnosis not present

## 2020-11-28 DIAGNOSIS — M9903 Segmental and somatic dysfunction of lumbar region: Secondary | ICD-10-CM | POA: Diagnosis not present

## 2020-11-28 DIAGNOSIS — M9905 Segmental and somatic dysfunction of pelvic region: Secondary | ICD-10-CM | POA: Diagnosis not present

## 2020-11-28 DIAGNOSIS — M9904 Segmental and somatic dysfunction of sacral region: Secondary | ICD-10-CM | POA: Diagnosis not present

## 2020-12-13 DIAGNOSIS — M9903 Segmental and somatic dysfunction of lumbar region: Secondary | ICD-10-CM | POA: Diagnosis not present

## 2020-12-13 DIAGNOSIS — M9905 Segmental and somatic dysfunction of pelvic region: Secondary | ICD-10-CM | POA: Diagnosis not present

## 2020-12-13 DIAGNOSIS — M5136 Other intervertebral disc degeneration, lumbar region: Secondary | ICD-10-CM | POA: Diagnosis not present

## 2020-12-13 DIAGNOSIS — M9904 Segmental and somatic dysfunction of sacral region: Secondary | ICD-10-CM | POA: Diagnosis not present

## 2020-12-15 DIAGNOSIS — M9904 Segmental and somatic dysfunction of sacral region: Secondary | ICD-10-CM | POA: Diagnosis not present

## 2020-12-15 DIAGNOSIS — M9903 Segmental and somatic dysfunction of lumbar region: Secondary | ICD-10-CM | POA: Diagnosis not present

## 2020-12-15 DIAGNOSIS — M9905 Segmental and somatic dysfunction of pelvic region: Secondary | ICD-10-CM | POA: Diagnosis not present

## 2020-12-15 DIAGNOSIS — M5136 Other intervertebral disc degeneration, lumbar region: Secondary | ICD-10-CM | POA: Diagnosis not present

## 2020-12-20 DIAGNOSIS — M9905 Segmental and somatic dysfunction of pelvic region: Secondary | ICD-10-CM | POA: Diagnosis not present

## 2020-12-20 DIAGNOSIS — M9903 Segmental and somatic dysfunction of lumbar region: Secondary | ICD-10-CM | POA: Diagnosis not present

## 2020-12-20 DIAGNOSIS — M5136 Other intervertebral disc degeneration, lumbar region: Secondary | ICD-10-CM | POA: Diagnosis not present

## 2020-12-20 DIAGNOSIS — M9904 Segmental and somatic dysfunction of sacral region: Secondary | ICD-10-CM | POA: Diagnosis not present

## 2020-12-27 DIAGNOSIS — M9905 Segmental and somatic dysfunction of pelvic region: Secondary | ICD-10-CM | POA: Diagnosis not present

## 2020-12-27 DIAGNOSIS — M9904 Segmental and somatic dysfunction of sacral region: Secondary | ICD-10-CM | POA: Diagnosis not present

## 2020-12-27 DIAGNOSIS — M5136 Other intervertebral disc degeneration, lumbar region: Secondary | ICD-10-CM | POA: Diagnosis not present

## 2020-12-27 DIAGNOSIS — M9903 Segmental and somatic dysfunction of lumbar region: Secondary | ICD-10-CM | POA: Diagnosis not present

## 2021-01-03 DIAGNOSIS — M9905 Segmental and somatic dysfunction of pelvic region: Secondary | ICD-10-CM | POA: Diagnosis not present

## 2021-01-03 DIAGNOSIS — M9904 Segmental and somatic dysfunction of sacral region: Secondary | ICD-10-CM | POA: Diagnosis not present

## 2021-01-03 DIAGNOSIS — M5136 Other intervertebral disc degeneration, lumbar region: Secondary | ICD-10-CM | POA: Diagnosis not present

## 2021-01-03 DIAGNOSIS — M9903 Segmental and somatic dysfunction of lumbar region: Secondary | ICD-10-CM | POA: Diagnosis not present

## 2021-01-10 DIAGNOSIS — M9903 Segmental and somatic dysfunction of lumbar region: Secondary | ICD-10-CM | POA: Diagnosis not present

## 2021-01-10 DIAGNOSIS — M9905 Segmental and somatic dysfunction of pelvic region: Secondary | ICD-10-CM | POA: Diagnosis not present

## 2021-01-10 DIAGNOSIS — M5136 Other intervertebral disc degeneration, lumbar region: Secondary | ICD-10-CM | POA: Diagnosis not present

## 2021-01-10 DIAGNOSIS — M9904 Segmental and somatic dysfunction of sacral region: Secondary | ICD-10-CM | POA: Diagnosis not present

## 2021-01-17 DIAGNOSIS — M9904 Segmental and somatic dysfunction of sacral region: Secondary | ICD-10-CM | POA: Diagnosis not present

## 2021-01-17 DIAGNOSIS — M9903 Segmental and somatic dysfunction of lumbar region: Secondary | ICD-10-CM | POA: Diagnosis not present

## 2021-01-17 DIAGNOSIS — M5136 Other intervertebral disc degeneration, lumbar region: Secondary | ICD-10-CM | POA: Diagnosis not present

## 2021-01-17 DIAGNOSIS — M9905 Segmental and somatic dysfunction of pelvic region: Secondary | ICD-10-CM | POA: Diagnosis not present

## 2021-01-23 ENCOUNTER — Other Ambulatory Visit: Payer: Self-pay

## 2021-01-23 DIAGNOSIS — Z006 Encounter for examination for normal comparison and control in clinical research program: Secondary | ICD-10-CM

## 2021-01-23 NOTE — Research (Signed)
Patient seen in clinic for ORION 4 study. Labs drawn in right [facility] prior to given IP medication. Pressure held for 5 minutes. IP given in the left upper arm. Patient tolerated both procedures without complaints. Next appointment Feb 15 @ 0900

## 2021-01-31 DIAGNOSIS — M9904 Segmental and somatic dysfunction of sacral region: Secondary | ICD-10-CM | POA: Diagnosis not present

## 2021-01-31 DIAGNOSIS — M9905 Segmental and somatic dysfunction of pelvic region: Secondary | ICD-10-CM | POA: Diagnosis not present

## 2021-01-31 DIAGNOSIS — M5136 Other intervertebral disc degeneration, lumbar region: Secondary | ICD-10-CM | POA: Diagnosis not present

## 2021-01-31 DIAGNOSIS — M9903 Segmental and somatic dysfunction of lumbar region: Secondary | ICD-10-CM | POA: Diagnosis not present

## 2021-02-20 DIAGNOSIS — M0609 Rheumatoid arthritis without rheumatoid factor, multiple sites: Secondary | ICD-10-CM | POA: Diagnosis not present

## 2021-02-20 DIAGNOSIS — Z79899 Other long term (current) drug therapy: Secondary | ICD-10-CM | POA: Diagnosis not present

## 2021-02-20 DIAGNOSIS — M170 Bilateral primary osteoarthritis of knee: Secondary | ICD-10-CM | POA: Diagnosis not present

## 2021-02-21 DIAGNOSIS — M9905 Segmental and somatic dysfunction of pelvic region: Secondary | ICD-10-CM | POA: Diagnosis not present

## 2021-02-21 DIAGNOSIS — M9903 Segmental and somatic dysfunction of lumbar region: Secondary | ICD-10-CM | POA: Diagnosis not present

## 2021-02-21 DIAGNOSIS — M9904 Segmental and somatic dysfunction of sacral region: Secondary | ICD-10-CM | POA: Diagnosis not present

## 2021-02-21 DIAGNOSIS — M5136 Other intervertebral disc degeneration, lumbar region: Secondary | ICD-10-CM | POA: Diagnosis not present

## 2021-03-21 DIAGNOSIS — M5136 Other intervertebral disc degeneration, lumbar region: Secondary | ICD-10-CM | POA: Diagnosis not present

## 2021-03-21 DIAGNOSIS — M9903 Segmental and somatic dysfunction of lumbar region: Secondary | ICD-10-CM | POA: Diagnosis not present

## 2021-03-21 DIAGNOSIS — M9904 Segmental and somatic dysfunction of sacral region: Secondary | ICD-10-CM | POA: Diagnosis not present

## 2021-03-21 DIAGNOSIS — M9905 Segmental and somatic dysfunction of pelvic region: Secondary | ICD-10-CM | POA: Diagnosis not present

## 2021-03-23 DIAGNOSIS — M170 Bilateral primary osteoarthritis of knee: Secondary | ICD-10-CM | POA: Diagnosis not present

## 2021-03-27 DIAGNOSIS — H2512 Age-related nuclear cataract, left eye: Secondary | ICD-10-CM | POA: Diagnosis not present

## 2021-04-25 DIAGNOSIS — M9905 Segmental and somatic dysfunction of pelvic region: Secondary | ICD-10-CM | POA: Diagnosis not present

## 2021-04-25 DIAGNOSIS — M5136 Other intervertebral disc degeneration, lumbar region: Secondary | ICD-10-CM | POA: Diagnosis not present

## 2021-04-25 DIAGNOSIS — M9903 Segmental and somatic dysfunction of lumbar region: Secondary | ICD-10-CM | POA: Diagnosis not present

## 2021-04-25 DIAGNOSIS — M9904 Segmental and somatic dysfunction of sacral region: Secondary | ICD-10-CM | POA: Diagnosis not present

## 2021-05-07 DIAGNOSIS — M9905 Segmental and somatic dysfunction of pelvic region: Secondary | ICD-10-CM | POA: Diagnosis not present

## 2021-05-07 DIAGNOSIS — M9903 Segmental and somatic dysfunction of lumbar region: Secondary | ICD-10-CM | POA: Diagnosis not present

## 2021-05-07 DIAGNOSIS — M9904 Segmental and somatic dysfunction of sacral region: Secondary | ICD-10-CM | POA: Diagnosis not present

## 2021-05-07 DIAGNOSIS — M5136 Other intervertebral disc degeneration, lumbar region: Secondary | ICD-10-CM | POA: Diagnosis not present

## 2021-05-14 ENCOUNTER — Other Ambulatory Visit: Payer: Self-pay

## 2021-05-14 ENCOUNTER — Encounter (HOSPITAL_BASED_OUTPATIENT_CLINIC_OR_DEPARTMENT_OTHER): Payer: Self-pay

## 2021-05-14 ENCOUNTER — Emergency Department (HOSPITAL_BASED_OUTPATIENT_CLINIC_OR_DEPARTMENT_OTHER): Payer: Medicare HMO

## 2021-05-14 ENCOUNTER — Emergency Department (HOSPITAL_BASED_OUTPATIENT_CLINIC_OR_DEPARTMENT_OTHER)
Admission: EM | Admit: 2021-05-14 | Discharge: 2021-05-14 | Disposition: A | Discharge status: Home / Self Care | Payer: Medicare HMO | Attending: Emergency Medicine | Admitting: Emergency Medicine

## 2021-05-14 DIAGNOSIS — Z8616 Personal history of COVID-19: Secondary | ICD-10-CM | POA: Insufficient documentation

## 2021-05-14 DIAGNOSIS — R29890 Loss of height: Secondary | ICD-10-CM | POA: Diagnosis not present

## 2021-05-14 DIAGNOSIS — I10 Essential (primary) hypertension: Secondary | ICD-10-CM | POA: Diagnosis not present

## 2021-05-14 DIAGNOSIS — Z20822 Contact with and (suspected) exposure to covid-19: Secondary | ICD-10-CM | POA: Diagnosis not present

## 2021-05-14 DIAGNOSIS — Y92009 Unspecified place in unspecified non-institutional (private) residence as the place of occurrence of the external cause: Secondary | ICD-10-CM | POA: Diagnosis not present

## 2021-05-14 DIAGNOSIS — I2510 Atherosclerotic heart disease of native coronary artery without angina pectoris: Secondary | ICD-10-CM | POA: Diagnosis not present

## 2021-05-14 DIAGNOSIS — S22050A Wedge compression fracture of T5-T6 vertebra, initial encounter for closed fracture: Secondary | ICD-10-CM | POA: Diagnosis not present

## 2021-05-14 DIAGNOSIS — Z7984 Long term (current) use of oral hypoglycemic drugs: Secondary | ICD-10-CM | POA: Diagnosis not present

## 2021-05-14 DIAGNOSIS — W19XXXA Unspecified fall, initial encounter: Secondary | ICD-10-CM | POA: Diagnosis not present

## 2021-05-14 DIAGNOSIS — S0990XA Unspecified injury of head, initial encounter: Secondary | ICD-10-CM | POA: Insufficient documentation

## 2021-05-14 DIAGNOSIS — S299XXA Unspecified injury of thorax, initial encounter: Secondary | ICD-10-CM | POA: Diagnosis present

## 2021-05-14 DIAGNOSIS — S3991XA Unspecified injury of abdomen, initial encounter: Secondary | ICD-10-CM | POA: Diagnosis present

## 2021-05-14 DIAGNOSIS — Z794 Long term (current) use of insulin: Secondary | ICD-10-CM | POA: Diagnosis not present

## 2021-05-14 DIAGNOSIS — E1142 Type 2 diabetes mellitus with diabetic polyneuropathy: Secondary | ICD-10-CM | POA: Diagnosis not present

## 2021-05-14 DIAGNOSIS — S199XXA Unspecified injury of neck, initial encounter: Secondary | ICD-10-CM | POA: Diagnosis not present

## 2021-05-14 DIAGNOSIS — R739 Hyperglycemia, unspecified: Secondary | ICD-10-CM | POA: Diagnosis not present

## 2021-05-14 DIAGNOSIS — M549 Dorsalgia, unspecified: Secondary | ICD-10-CM | POA: Diagnosis not present

## 2021-05-14 DIAGNOSIS — M545 Low back pain, unspecified: Secondary | ICD-10-CM | POA: Diagnosis not present

## 2021-05-14 DIAGNOSIS — W01198A Fall on same level from slipping, tripping and stumbling with subsequent striking against other object, initial encounter: Secondary | ICD-10-CM | POA: Diagnosis not present

## 2021-05-14 DIAGNOSIS — R296 Repeated falls: Secondary | ICD-10-CM | POA: Diagnosis not present

## 2021-05-14 DIAGNOSIS — Z955 Presence of coronary angioplasty implant and graft: Secondary | ICD-10-CM | POA: Insufficient documentation

## 2021-05-14 DIAGNOSIS — K4020 Bilateral inguinal hernia, without obstruction or gangrene, not specified as recurrent: Secondary | ICD-10-CM | POA: Diagnosis not present

## 2021-05-14 DIAGNOSIS — Z79899 Other long term (current) drug therapy: Secondary | ICD-10-CM | POA: Diagnosis not present

## 2021-05-14 DIAGNOSIS — I700 Atherosclerosis of aorta: Secondary | ICD-10-CM | POA: Diagnosis not present

## 2021-05-14 LAB — RESP PANEL BY RT-PCR (FLU A&B, COVID) ARPGX2
Influenza A by PCR: NEGATIVE
Influenza B by PCR: NEGATIVE
SARS Coronavirus 2 by RT PCR: NEGATIVE

## 2021-05-14 LAB — CBC
HCT: 45.5 % (ref 39.0–52.0)
Hemoglobin: 15.4 g/dL (ref 13.0–17.0)
MCH: 30.1 pg (ref 26.0–34.0)
MCHC: 33.8 g/dL (ref 30.0–36.0)
MCV: 89 fL (ref 80.0–100.0)
Platelets: 298 10*3/uL (ref 150–400)
RBC: 5.11 MIL/uL (ref 4.22–5.81)
RDW: 13.9 % (ref 11.5–15.5)
WBC: 11.3 10*3/uL — ABNORMAL HIGH (ref 4.0–10.5)
nRBC: 0 % (ref 0.0–0.2)

## 2021-05-14 LAB — COMPREHENSIVE METABOLIC PANEL
ALT: 14 U/L (ref 0–44)
AST: 12 U/L — ABNORMAL LOW (ref 15–41)
Albumin: 4.1 g/dL (ref 3.5–5.0)
Alkaline Phosphatase: 71 U/L (ref 38–126)
Anion gap: 11 (ref 5–15)
BUN: 19 mg/dL (ref 8–23)
CO2: 25 mmol/L (ref 22–32)
Calcium: 9.9 mg/dL (ref 8.9–10.3)
Chloride: 97 mmol/L — ABNORMAL LOW (ref 98–111)
Creatinine, Ser: 0.86 mg/dL (ref 0.61–1.24)
GFR, Estimated: >60 mL/min (ref >60)
Glucose, Bld: 348 mg/dL — ABNORMAL HIGH (ref 70–99)
Potassium: 4.3 mmol/L (ref 3.5–5.1)
Sodium: 133 mmol/L — ABNORMAL LOW (ref 135–145)
Total Bilirubin: 0.6 mg/dL (ref 0.3–1.2)
Total Protein: 7.2 g/dL (ref 6.5–8.1)

## 2021-05-14 LAB — CBG MONITORING, ED: Glucose-Capillary: 286 mg/dL — ABNORMAL HIGH (ref 70–99)

## 2021-05-14 LAB — PROTIME-INR
INR: 1 (ref 0.8–1.2)
Prothrombin Time: 12.9 seconds (ref 11.4–15.2)

## 2021-05-14 LAB — TROPONIN I (HIGH SENSITIVITY)
Troponin I (High Sensitivity): 5 ng/L (ref <18)
Troponin I (High Sensitivity): 4 ng/L (ref <18)

## 2021-05-14 LAB — MAGNESIUM: Magnesium: 1.8 mg/dL (ref 1.7–2.4)

## 2021-05-14 IMAGING — CT CT CHEST-ABD-PELV W/O CM
2 of 4 series · 15 of 46 positions shown, 17 images · non-contrast
Comparison: [DATE].

CLINICAL DATA: Falls.  Initial encounter.

EXAM:
CT CHEST, ABDOMEN AND PELVIS WITHOUT CONTRAST
TECHNIQUE: Multidetector CT imaging of the chest, abdomen and pelvis was
performed following the standard protocol without IV contrast.

[Series 2: cap without · axial · non-contrast · 0.79mm/px · z∈[-853,-268]mm · 12 of 136 slices shown, 14 images]
[im 10/136  soft-tissue]
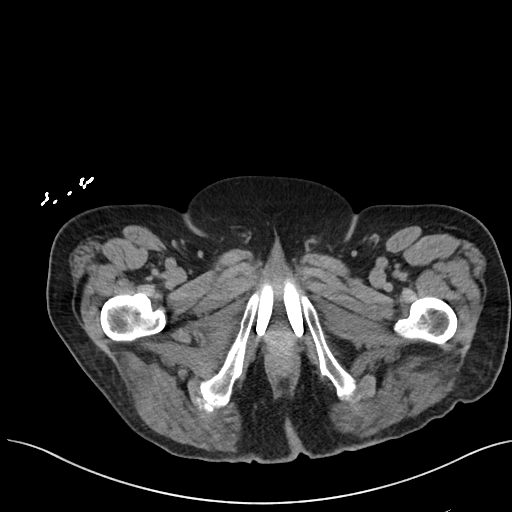
[im 10/136  bone]
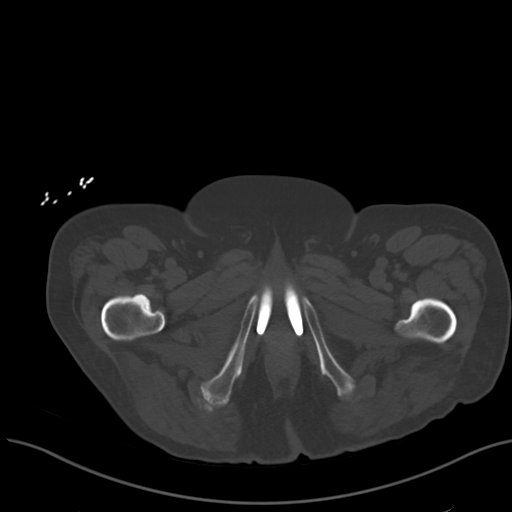
[im 19/136  soft-tissue]
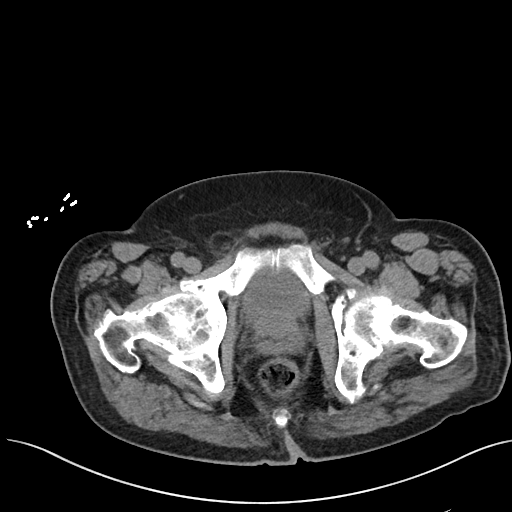
[im 28/136  soft-tissue]
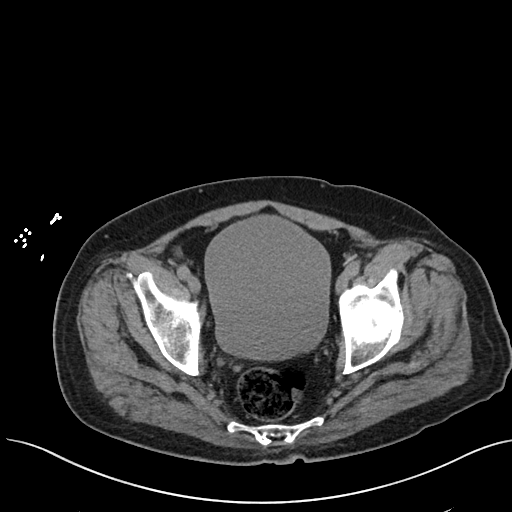
[im 46/136  soft-tissue]
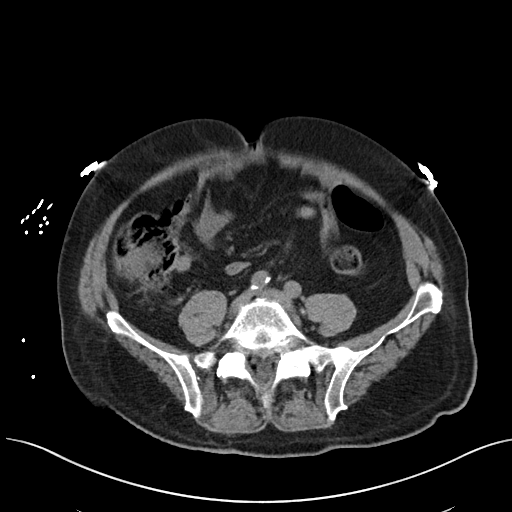
[im 55/136  soft-tissue]
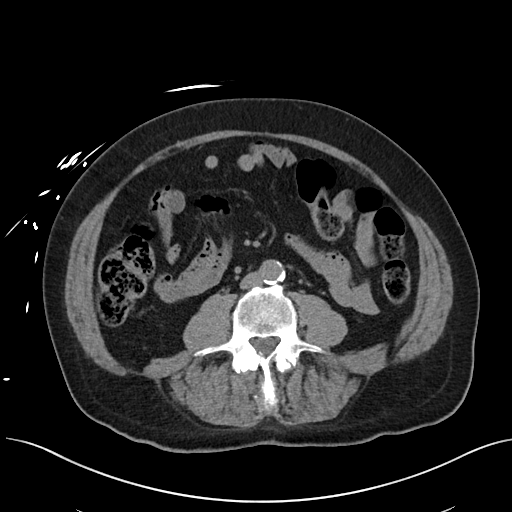
[im 64/136  soft-tissue]
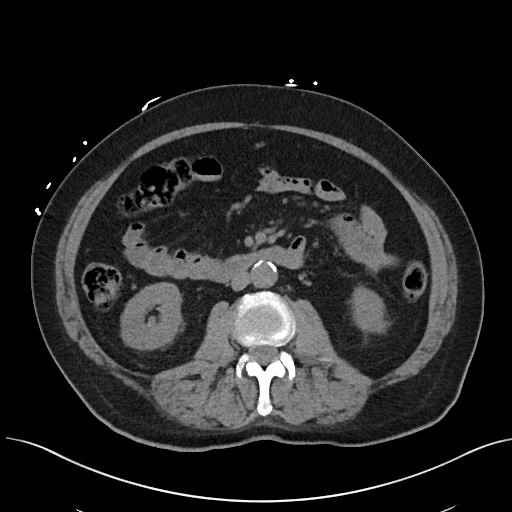
[im 73/136  soft-tissue]
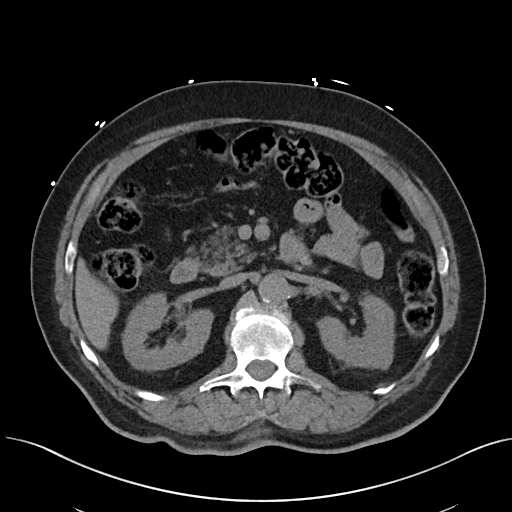
[im 82/136  soft-tissue]
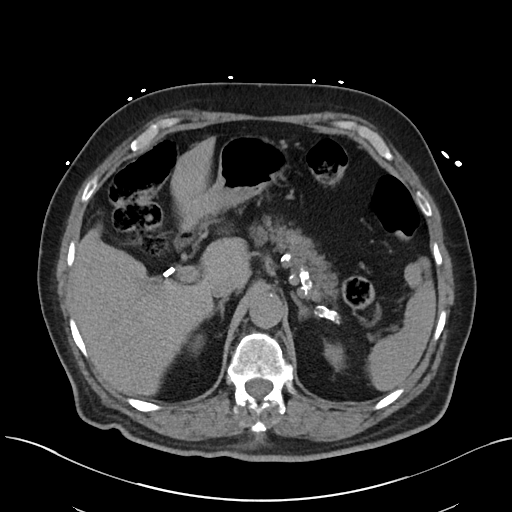
[im 91/136  soft-tissue]
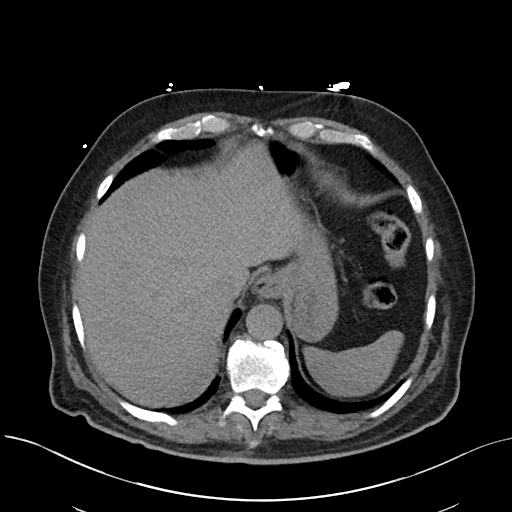
[im 91/136  bone]
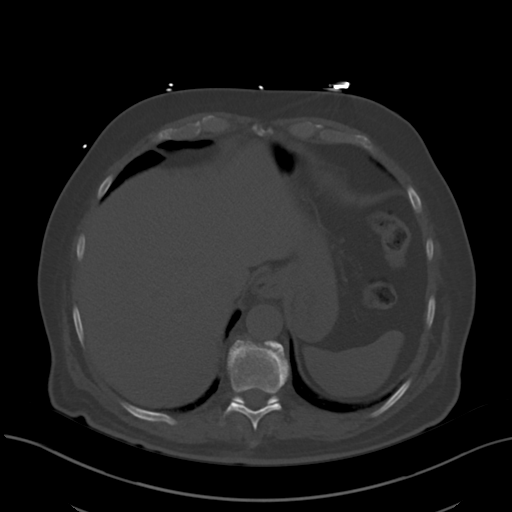
[im 109/136  soft-tissue]
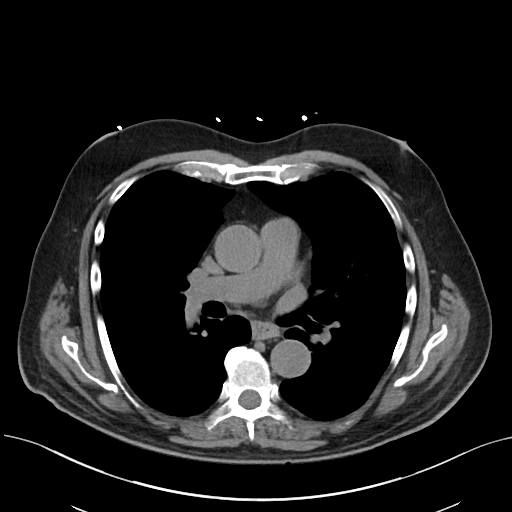
[im 118/136  soft-tissue]
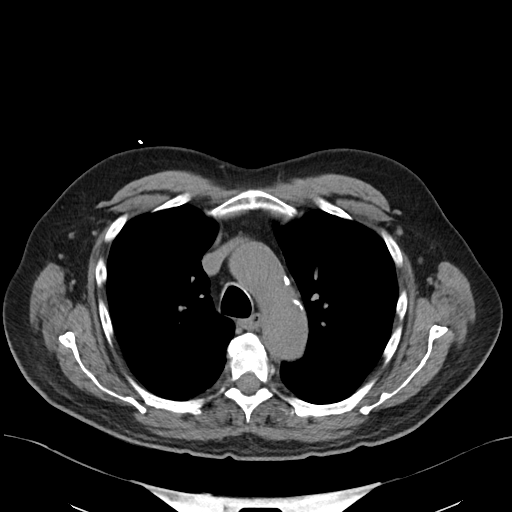
[im 127/136  soft-tissue]
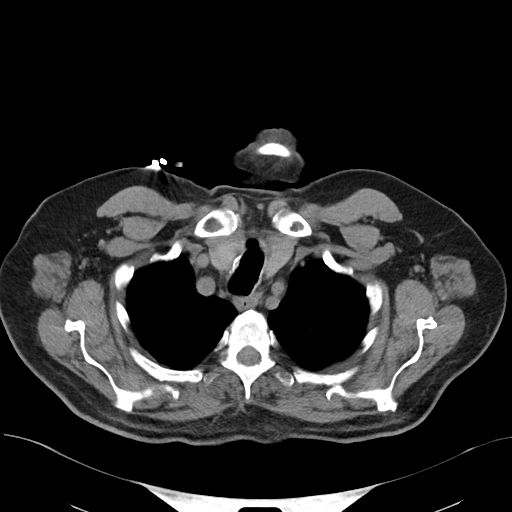

[Series 5: coronal · coronal · 0.87mm/px · 3 of 114 slices shown]
[im 38/114  soft-tissue]
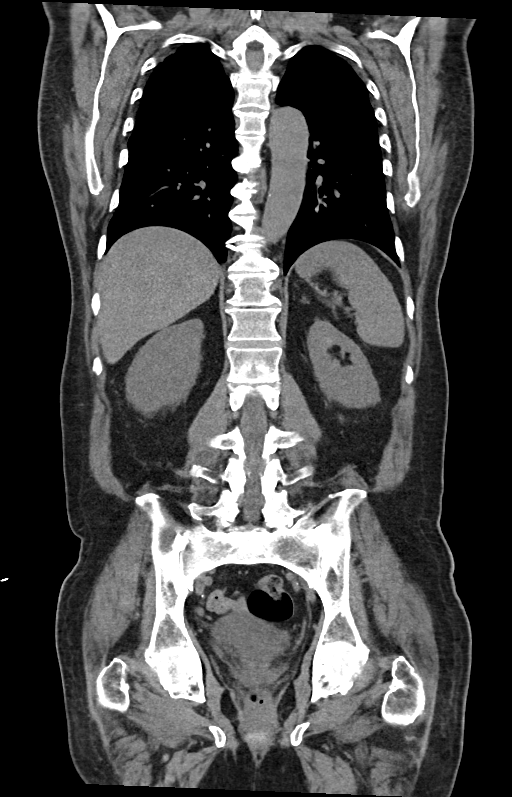
[im 51/114  soft-tissue]
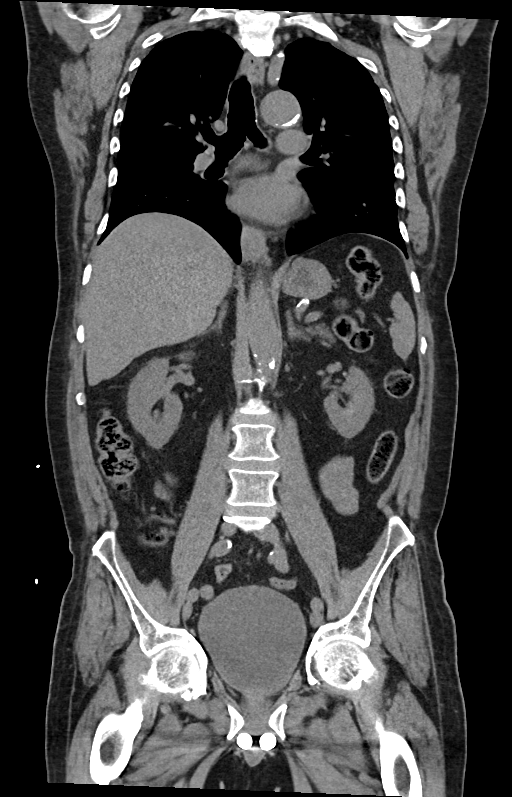
[im 63/114  soft-tissue]
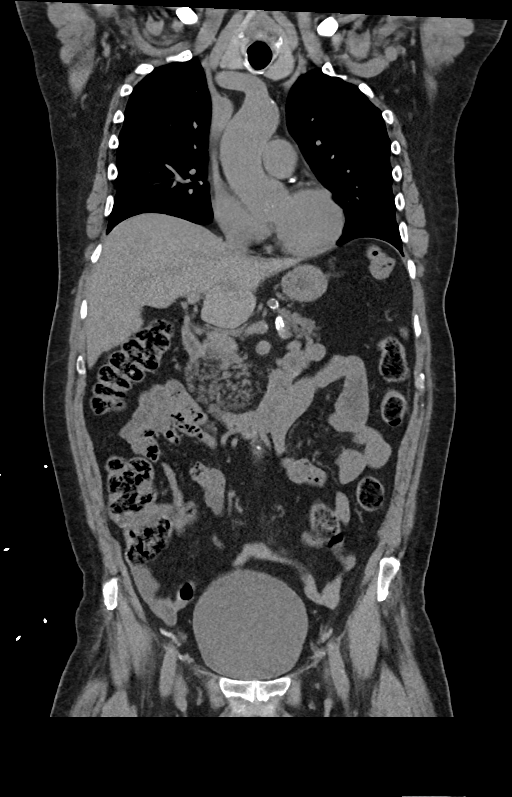

[15 of 46 positions shown; findings below may reference images not displayed]

FINDINGS: CT CHEST FINDINGS

Cardiovascular: Atherosclerotic calcification of the aorta, aortic
valve and coronary arteries. Heart size normal. No pericardial
effusion.

Mediastinum/Nodes: No pathologically enlarged mediastinal or
axillary lymph nodes. Hilar regions are difficult to evaluate
without IV contrast but appear grossly unremarkable. Esophagus is
grossly unremarkable.

Lungs/Pleura: Calcified granuloma in the superior segment left lower
lobe. Minimal dependent atelectasis bilaterally. Minimal scarring in
the lingula. No pleural fluid. Airway is unremarkable.

Musculoskeletal: Old right rib fractures. T5 superior endplate
compression fracture, new from [DATE] but age indeterminate.

CT ABDOMEN PELVIS FINDINGS

Hepatobiliary: Liver is unremarkable. Cholecystectomy. No biliary
ductal dilatation.

Pancreas: Negative.

Spleen: Negative.

Adrenals/Urinary Tract: Adrenal glands are unremarkable. 2.3 cm
low-attenuation lesion in the interpolar right kidney is likely a
cyst. Kidneys are otherwise unremarkable. Ureters are decompressed.
Bladder is grossly unremarkable.

Stomach/Bowel: Stomach, small bowel, appendix and colon are
unremarkable.

Vascular/Lymphatic: Atherosclerotic calcification of the aorta. No
pathologically enlarged lymph nodes.

Reproductive: Prostate is visualized.

Other: Tiny bilateral inguinal hernias contain fat. No free fluid.
Mesenteries and peritoneum are unremarkable.

Musculoskeletal: Degenerative changes in the hips and spine. No
fracture. No worrisome lytic or sclerotic lesions.
IMPRESSION: 1. T5 superior endplate compression fracture is age indeterminate.
CT thoracic spine dictated separately.
2. No additional evidence of acute trauma to the chest, abdomen or
pelvis.
3. Aortic atherosclerosis ([GI]-[GI]). Coronary artery
calcification.

## 2021-05-14 IMAGING — CT CT L SPINE W/O CM
3 of 4 series · 12 of 33 positions shown, 14 images · non-contrast
Comparison: [DATE]

CLINICAL DATA: Fall with back pain

EXAM:
CT LUMBAR SPINE WITHOUT CONTRAST
TECHNIQUE: Multidetector CT imaging of the lumbar spine was performed without
intravenous contrast administration. Multiplanar CT image
reconstructions were also generated.

[Series 1: l spine bone · axial · 0.38mm/px · z∈[-676,-459]mm · 4 of 165 slices shown, 5 images]
[im 28/165  soft-tissue]
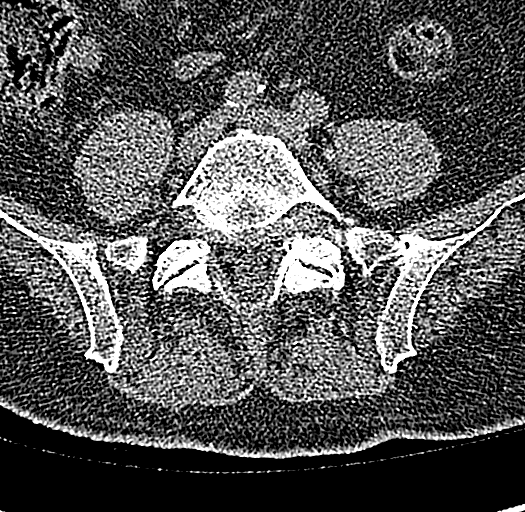
[im 28/165  bone]
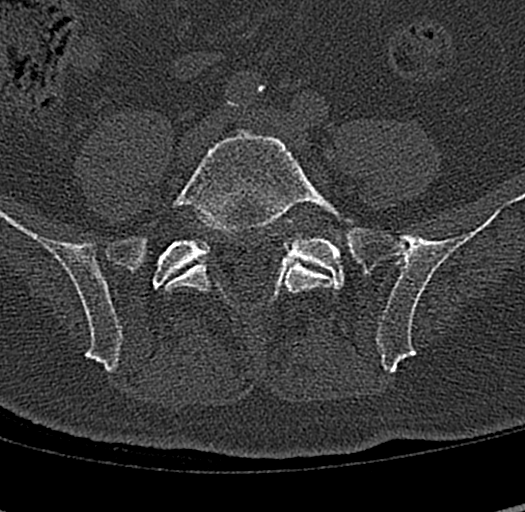
[im 55/165  bone]
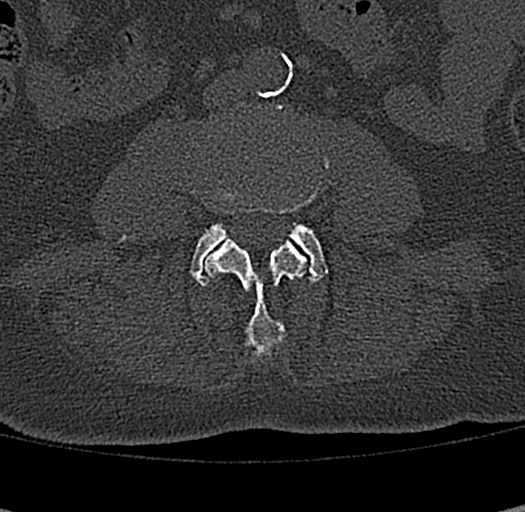
[im 110/165  bone]
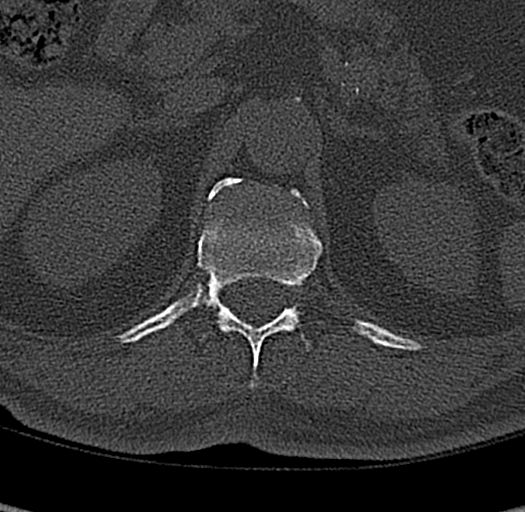
[im 137/165  bone]
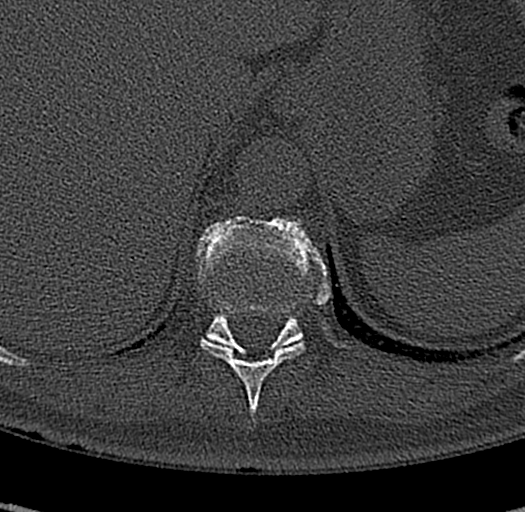

[Series 4: l spine cor bone · coronal · 0.39mm/px · 3 of 99 slices shown]
[im 20/99  bone]
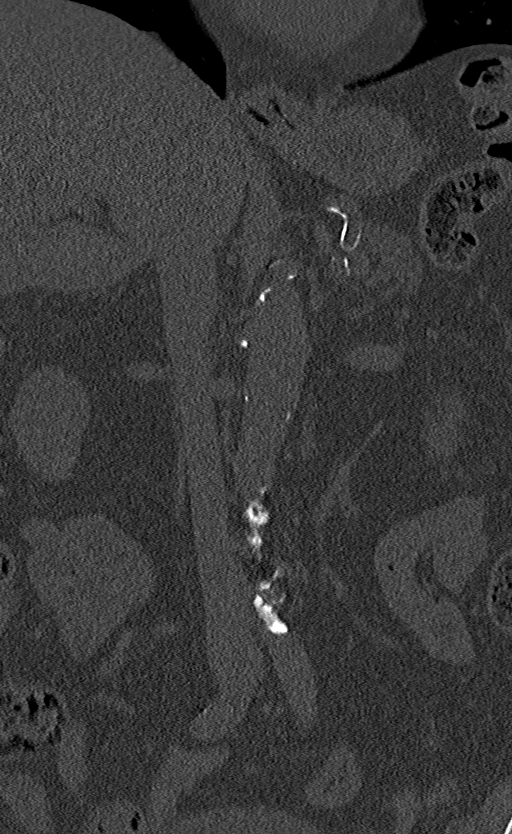
[im 40/99  bone]
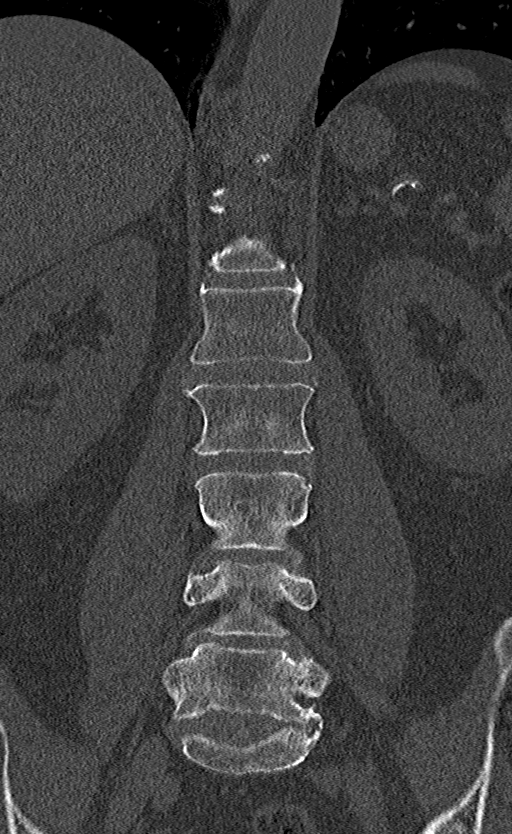
[im 59/99  bone]
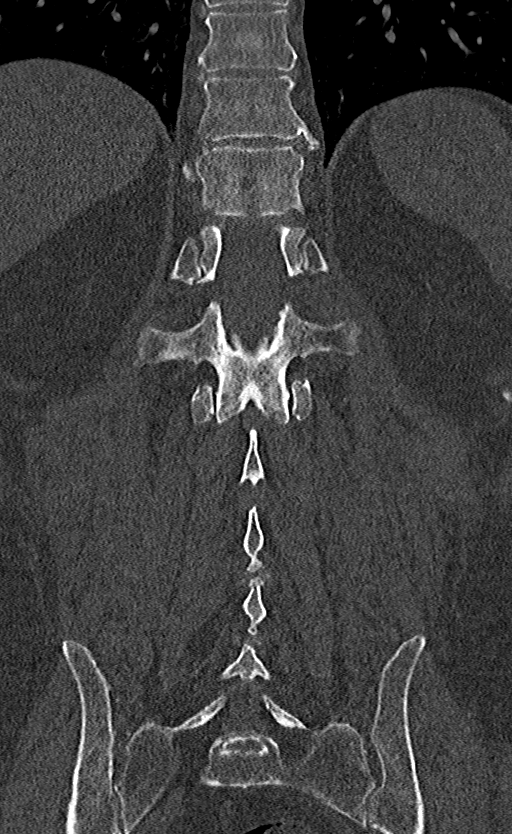

[Series 5: l spine sag bone · sagittal · 0.38mm/px · 5 of 102 slices shown, 6 images]
[im 34/102  bone]
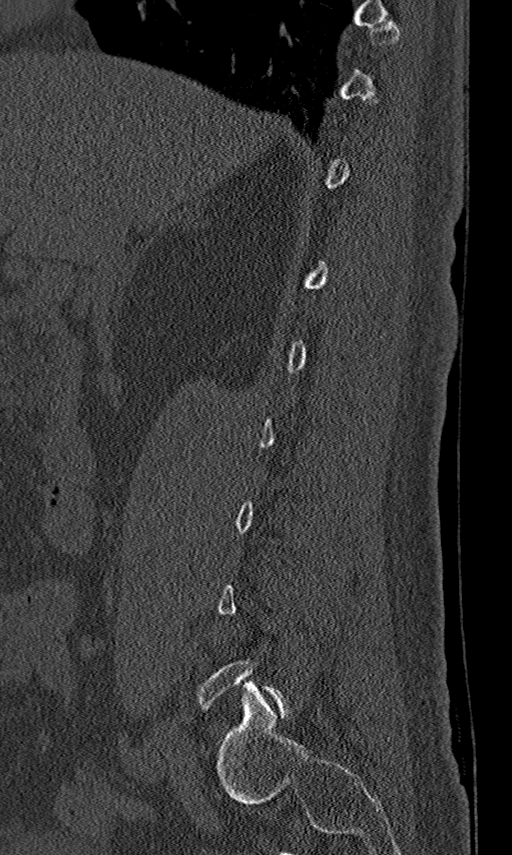
[im 43/102  bone]
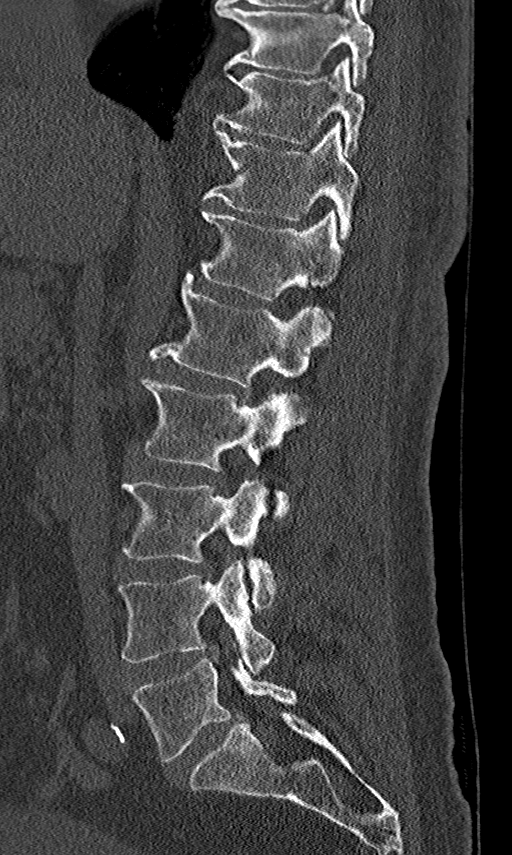
[im 51/102  soft-tissue]
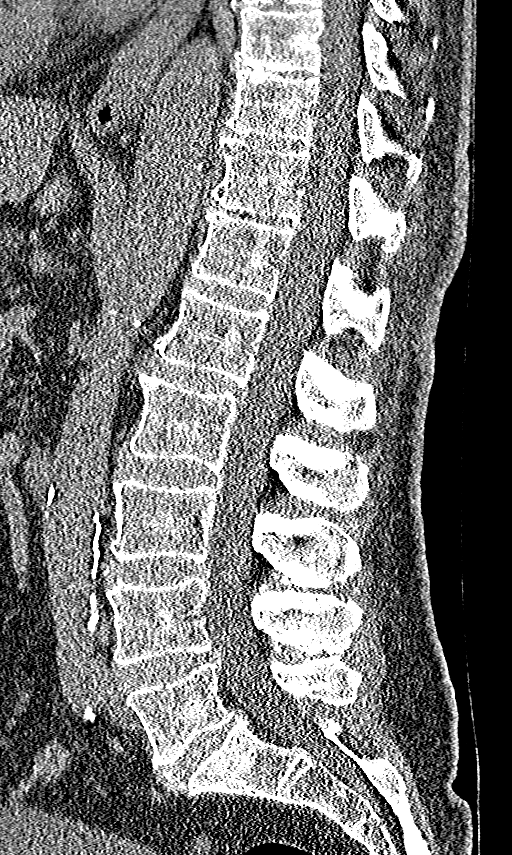
[im 51/102  bone]
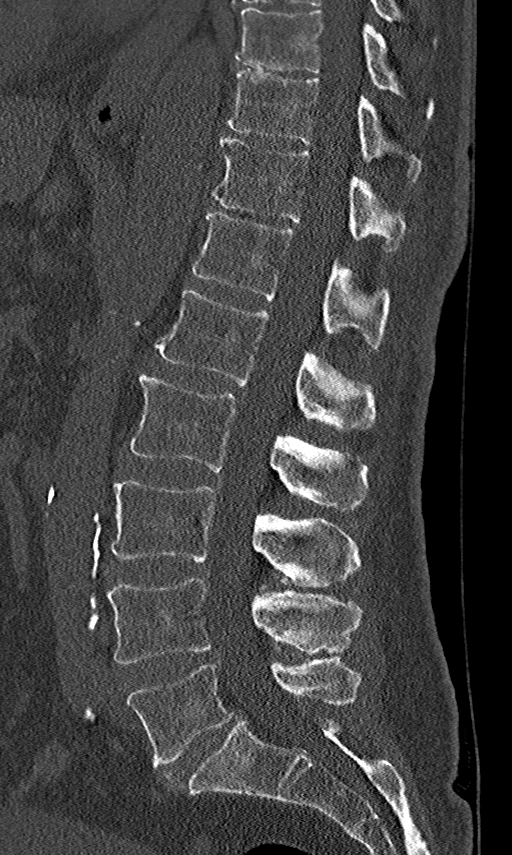
[im 59/102  bone]
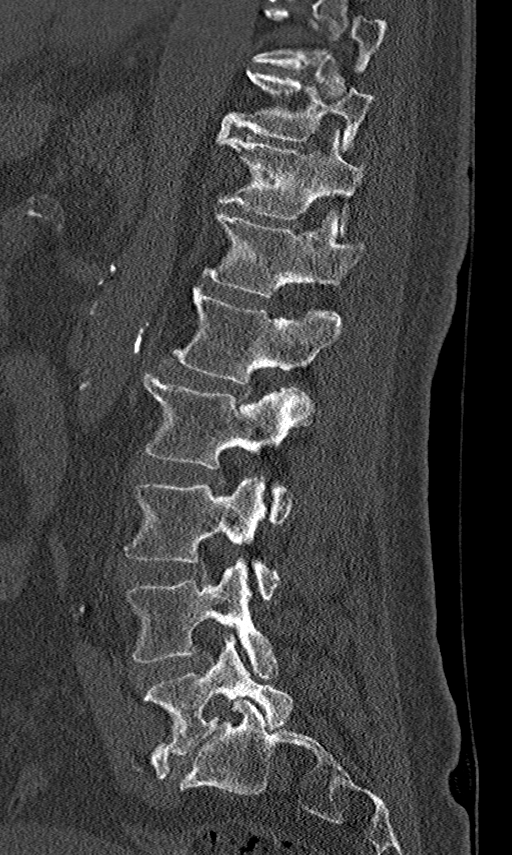
[im 68/102  bone]
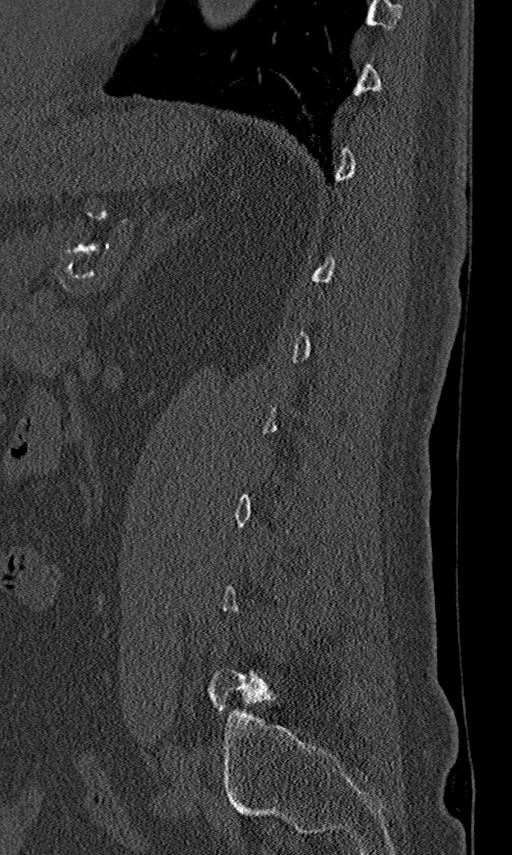

[12 of 33 positions shown; findings below may reference images not displayed]

FINDINGS: Segmentation: 5 lumbar type vertebral bodies.

Alignment: Normal.

Vertebrae: No fracture or focal bone lesion.

Paraspinal and other soft tissues: Negative except for aortic
atherosclerotic calcification.

Disc levels: No significant disc level pathology at L1-2, L2-3 or
L3-4.

L4-5: Moderate bulging of the disc. No apparent compressive
stenosis.

L5-S1: Mild bulging of the disc. Facet degeneration left worse than
right. No apparent compressive stenosis.
IMPRESSION: No acute or traumatic finding. Fairly mild lower lumbar degenerative
changes considering age. No change since [DATE].

## 2021-05-14 IMAGING — CT CT T SPINE W/O CM
3 series · 11 of 33 positions shown, 13 images · non-contrast
Comparison: [DATE]

CLINICAL DATA: Fall with back pain.

EXAM:
CT THORACIC SPINE WITHOUT CONTRAST
TECHNIQUE: Multidetector CT images of the thoracic were obtained using the
standard protocol without intravenous contrast.

[Series 2: t spine soft tis · axial · 0.38mm/px · z∈[-481,-275]mm · 3 of 168 slices shown, 4 images]
[im 39/168  soft-tissue]
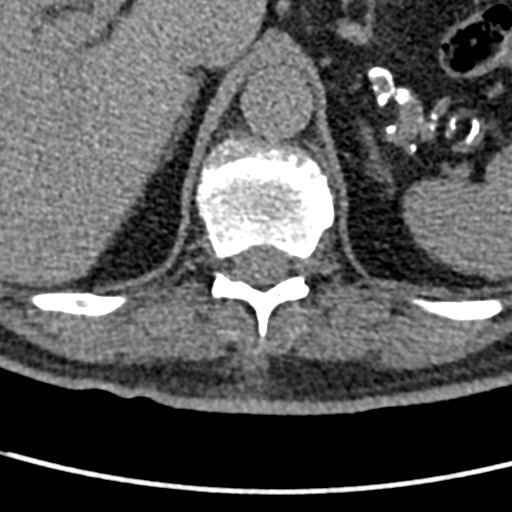
[im 39/168  bone]
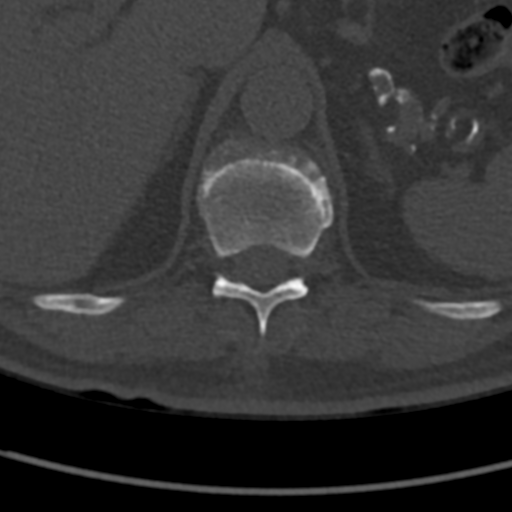
[im 90/168  bone]
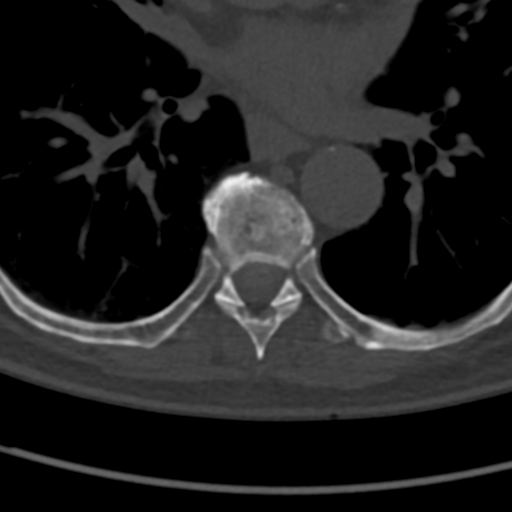
[im 142/168  bone]
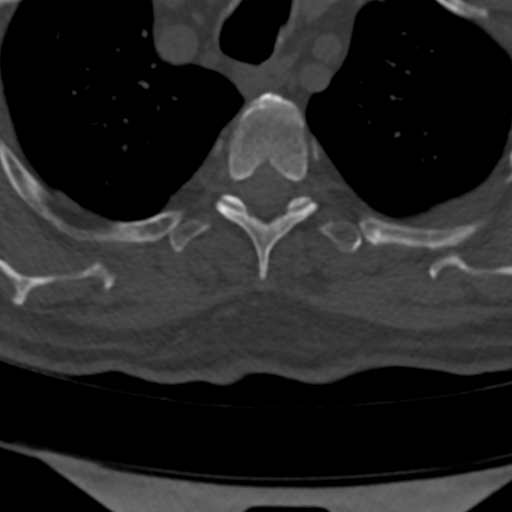

[Series 3: t spine cor bone · coronal · 0.38mm/px · 3 of 97 slices shown]
[im 20/97  bone]
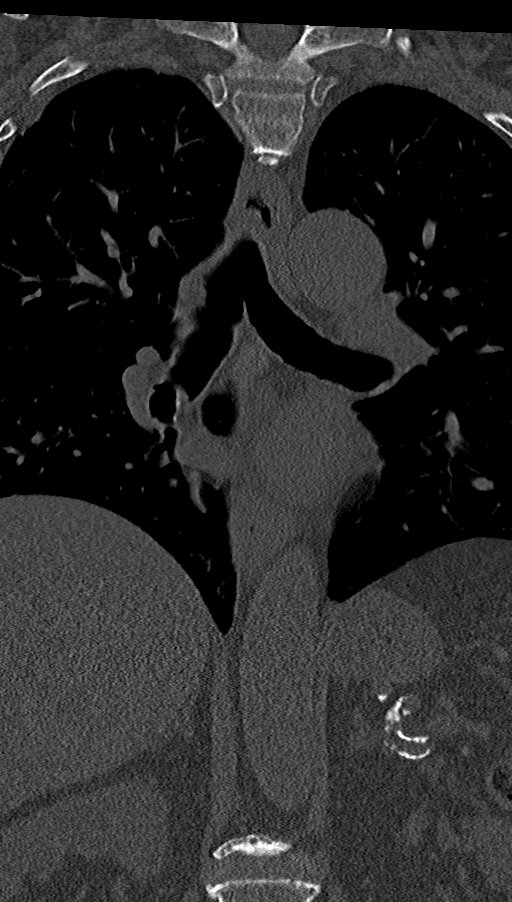
[im 39/97  bone]
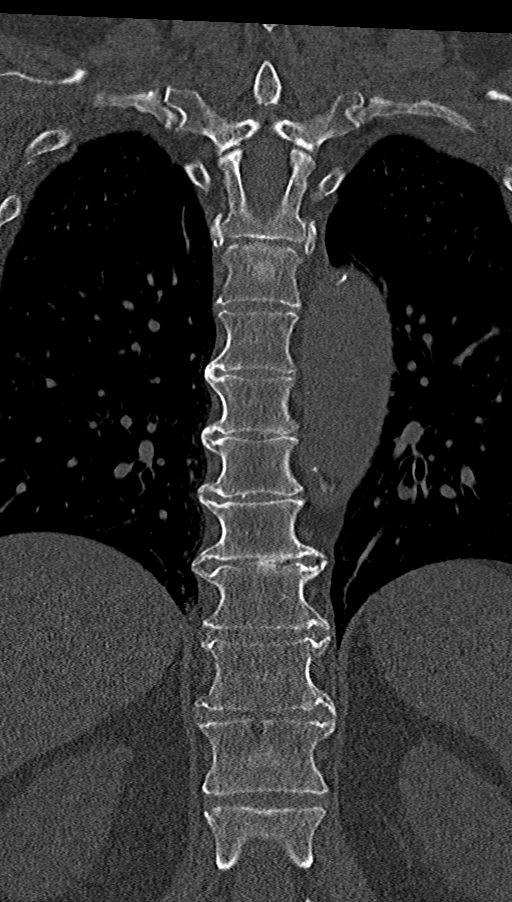
[im 58/97  bone]
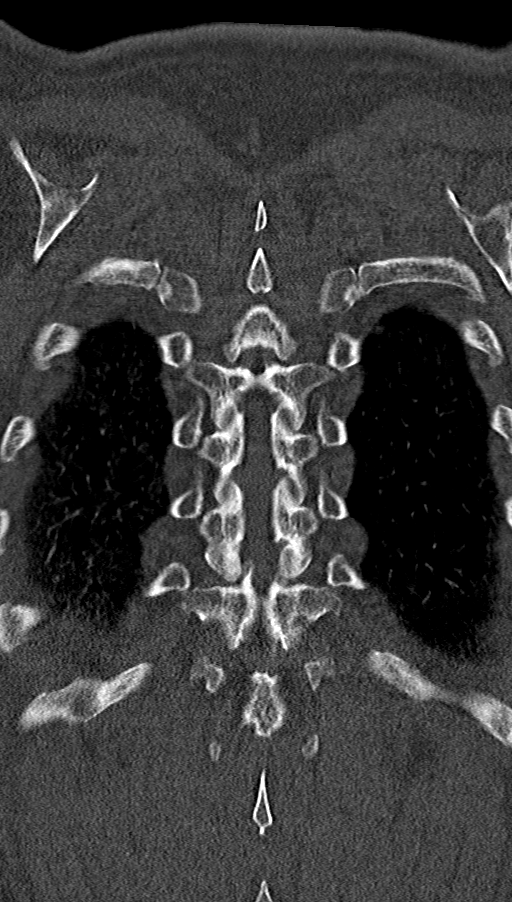

[Series 4: t spine sag bone · sagittal · 0.38mm/px · 5 of 97 slices shown, 6 images]
[im 33/97  bone]
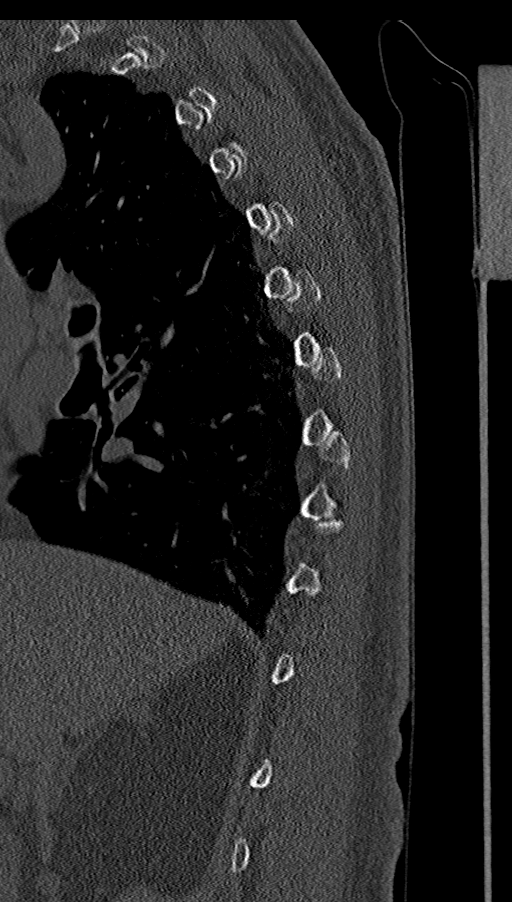
[im 41/97  bone]
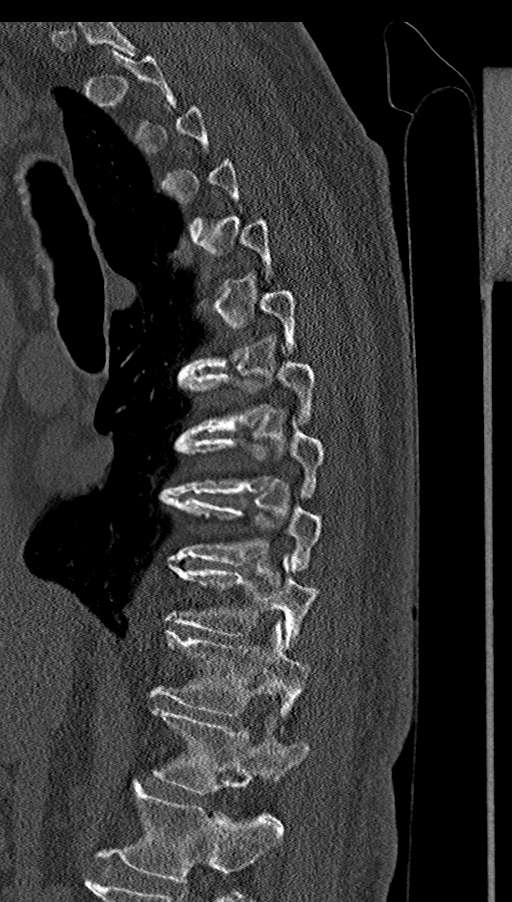
[im 49/97  soft-tissue]
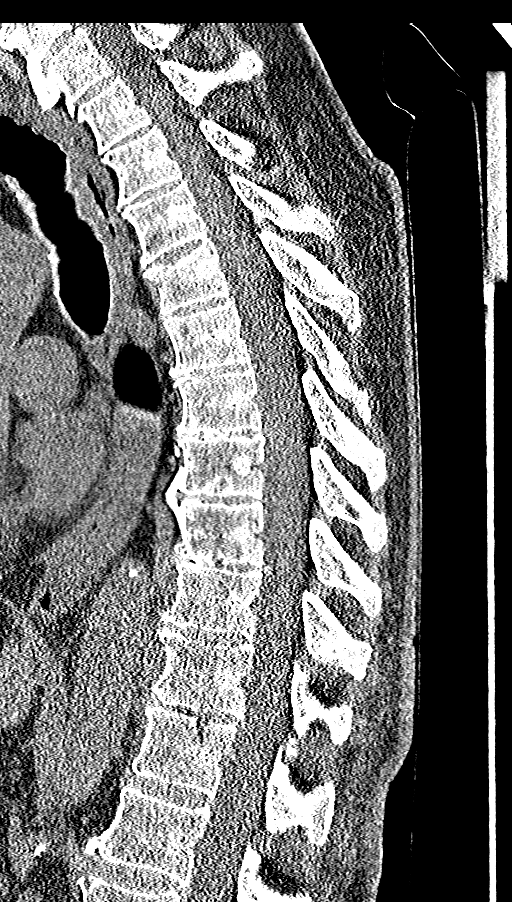
[im 49/97  bone]
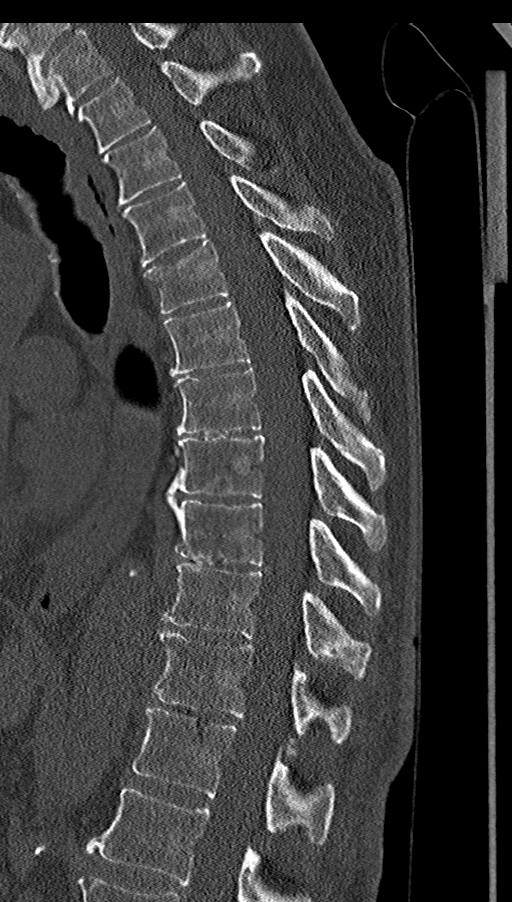
[im 57/97  bone]
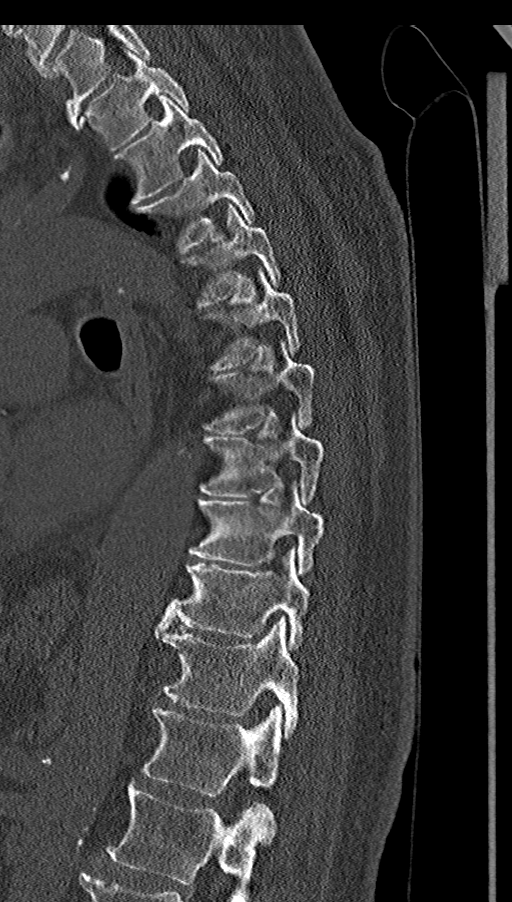
[im 65/97  bone]
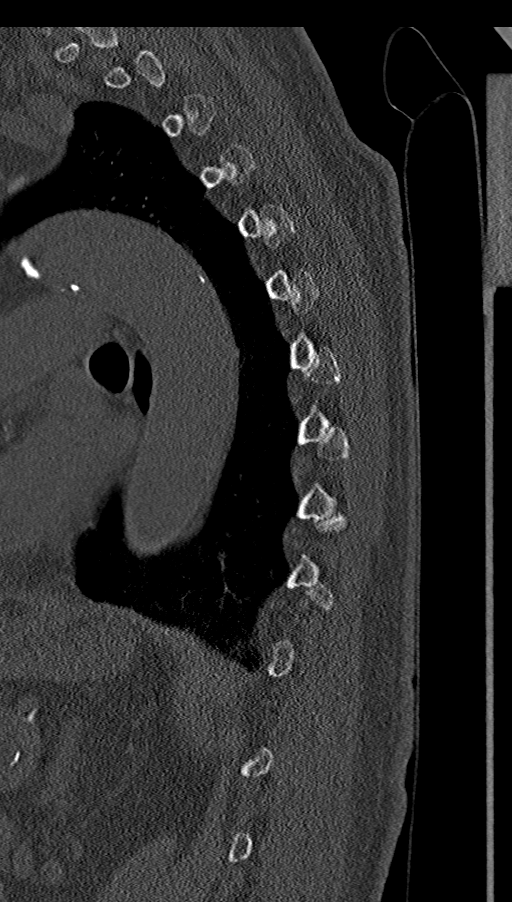

[11 of 33 positions shown; findings below may reference images not displayed]

FINDINGS: Alignment: Or normal

Vertebrae: There is an acute superior endplate fracture at T5 with
loss of height of only 20%. No retropulsed bone. No other thoracic
region traumatic finding.

Paraspinal and other soft tissues: Negative other than aortic
atherosclerosis.

Disc levels: No significant disc level pathology. No stenosis of the
canal or foramina.
IMPRESSION: Acute superior endplate fracture at T5 with loss of height of 20%.
No retropulsed bone.

## 2021-05-14 IMAGING — CT CT CERVICAL SPINE W/O CM
3 of 6 series · 12 of 33 positions shown, 14 images · non-contrast
Comparison: None.

CLINICAL DATA: Neck trauma (Age >= 65y); mechanical falls

EXAM:
CT CERVICAL SPINE WITHOUT CONTRAST
TECHNIQUE: Multidetector CT imaging of the cervical spine was performed without
intravenous contrast. Multiplanar CT image reconstructions were also
generated.

[Series 4: c spine soft · axial · 0.44mm/px · z∈[-237,-117]mm · 4 of 100 slices shown, 5 images]
[im 20/100  soft-tissue]
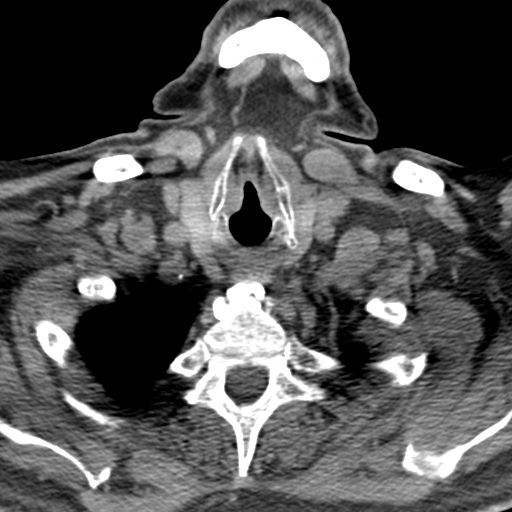
[im 20/100  bone]
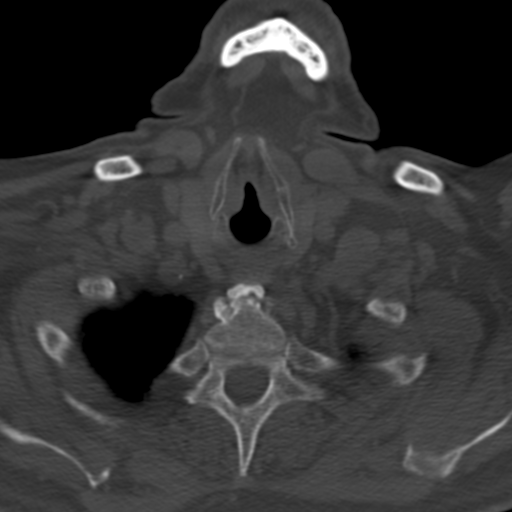
[im 40/100  bone]
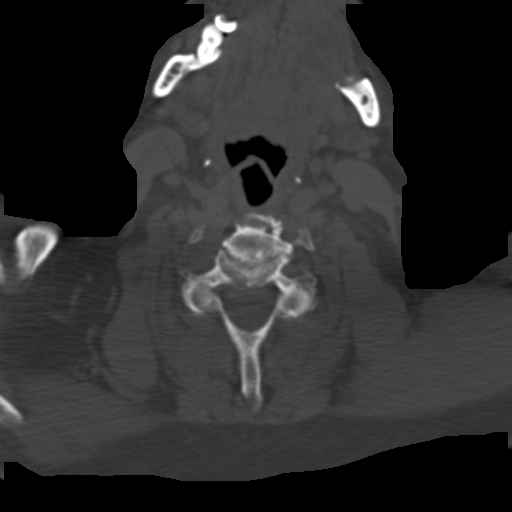
[im 60/100  bone]
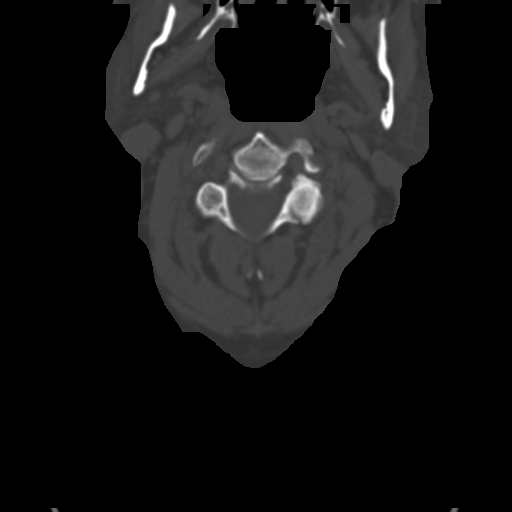
[im 80/100  bone]
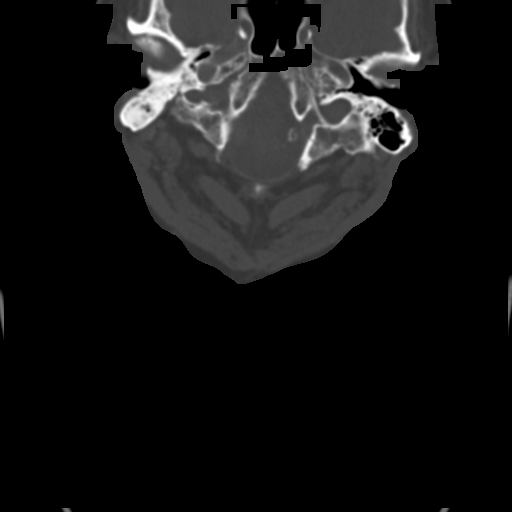

[Series 5: cor bone · coronal · 0.34mm/px · 3 of 80 slices shown]
[im 28/80  bone]
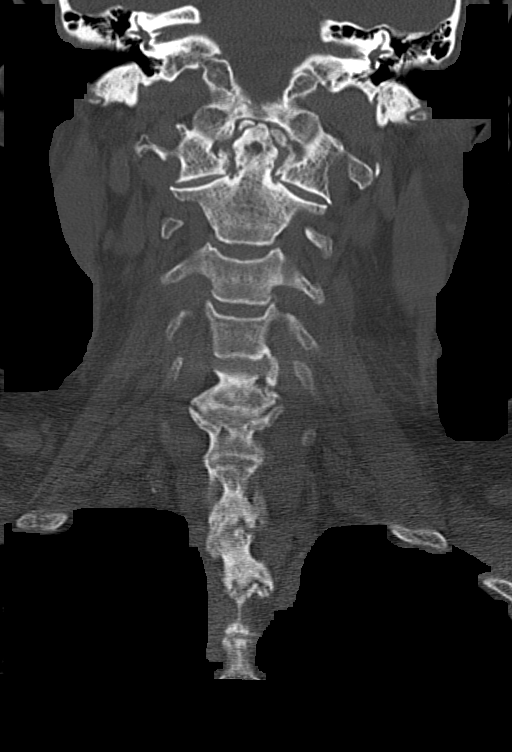
[im 36/80  bone]
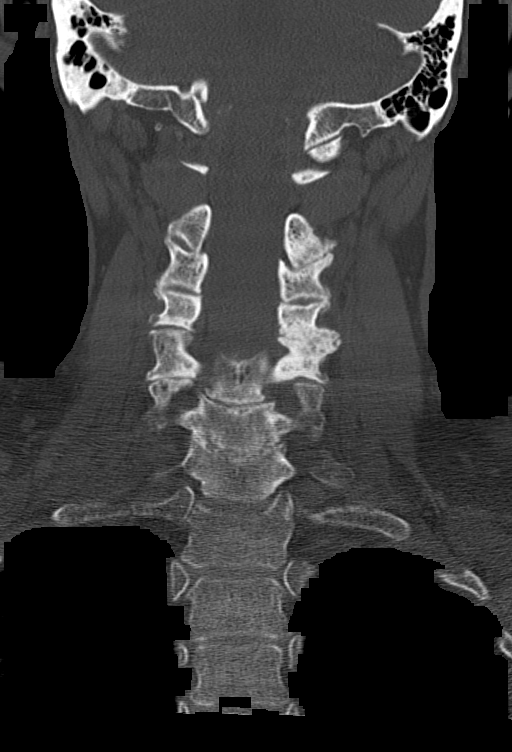
[im 44/80  bone]
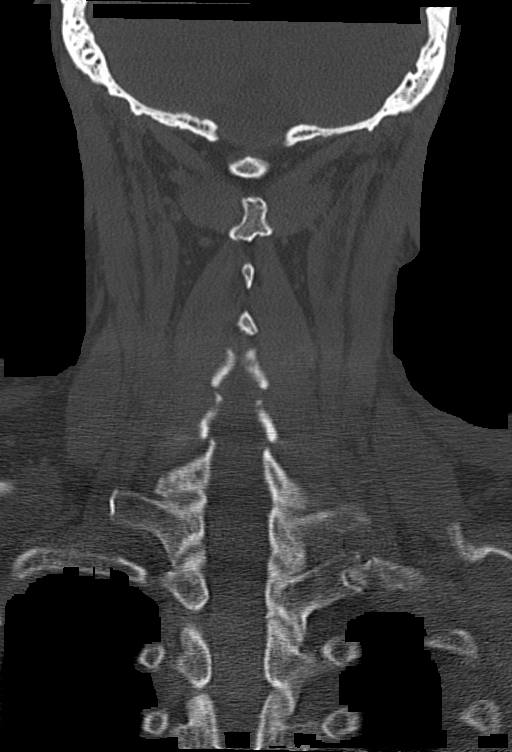

[Series 6: sag bone · sagittal · 0.31mm/px · 5 of 87 slices shown, 6 images]
[im 29/87  bone]
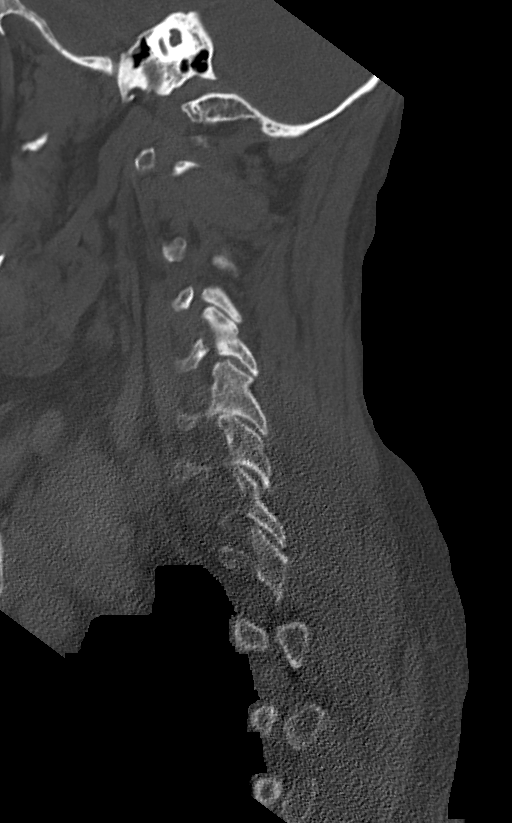
[im 36/87  bone]
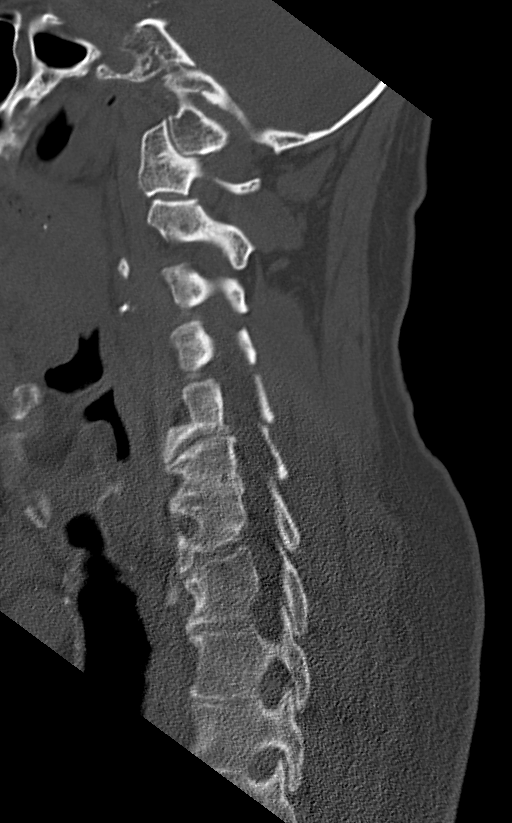
[im 44/87  soft-tissue]
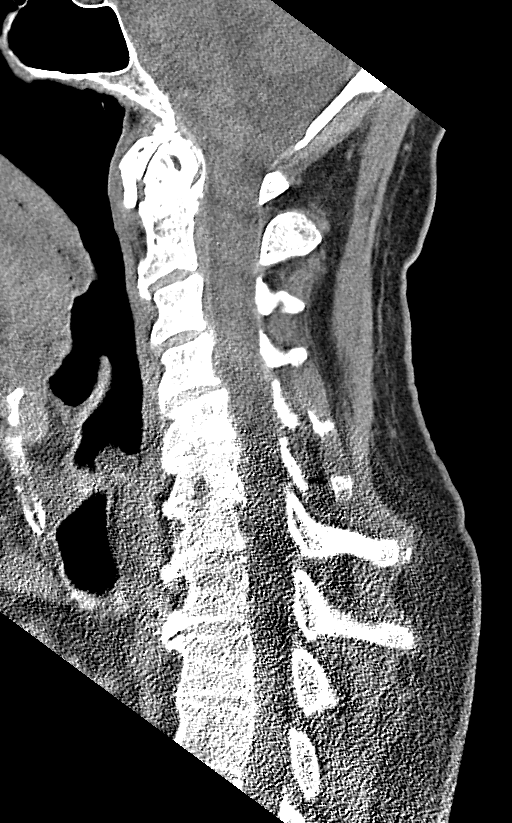
[im 44/87  bone]
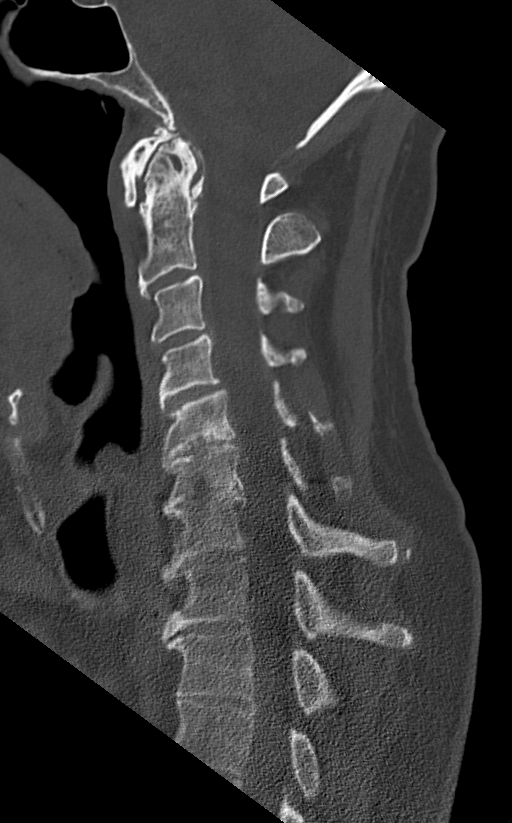
[im 51/87  bone]
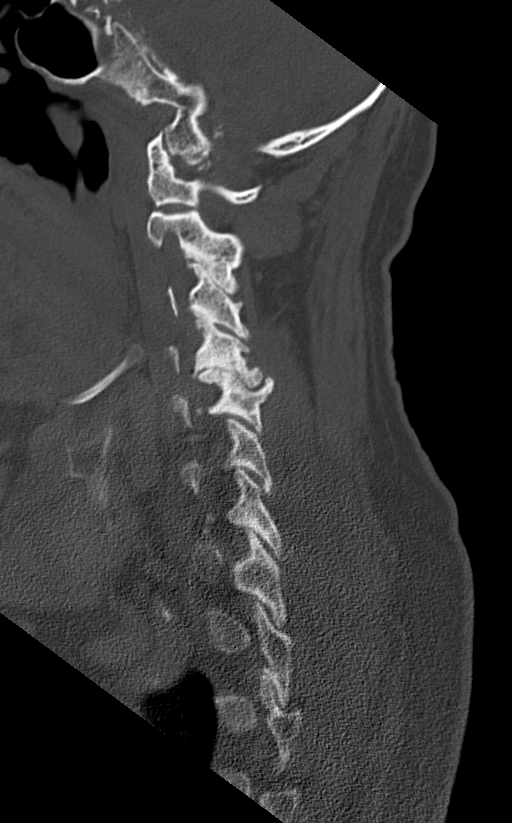
[im 58/87  bone]
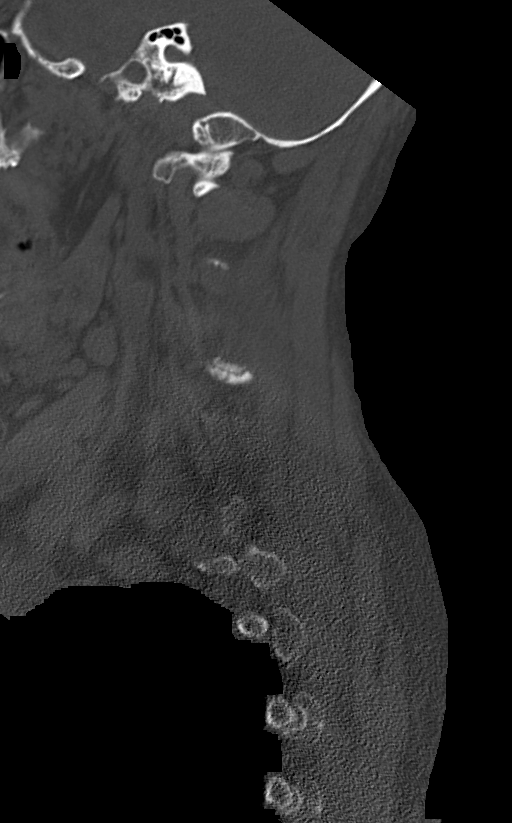

[12 of 33 positions shown; findings below may reference images not displayed]

FINDINGS: Alignment: Minor degenerative listhesis.

Skull base and vertebrae: Multilevel degenerative plate
irregularity. No acute fracture.

Soft tissues and spinal canal: No prevertebral fluid or swelling. No
visible canal hematoma.

Disc levels: Multilevel degenerative changes are present including
disc space narrowing, endplate osteophytes, and facet and
uncovertebral hypertrophy.

Upper chest: Dictated separately.

Other: Unremarkable.
IMPRESSION: No acute cervical spine fracture.

## 2021-05-14 IMAGING — CT CT HEAD W/O CM
4 series · 17 of 47 positions shown, 19 images · non-contrast
Comparison: [DATE]

CLINICAL DATA: Head trauma, minor (Age >= 65y)

EXAM:
CT HEAD WITHOUT CONTRAST
TECHNIQUE: Contiguous axial images were obtained from the base of the skull
through the vertex without intravenous contrast.

[Series 2: head wo · axial · 0.47mm/px · z∈[-132,-12]mm · 7 of 34 slices shown, 9 images]
[im 5/34  brain]
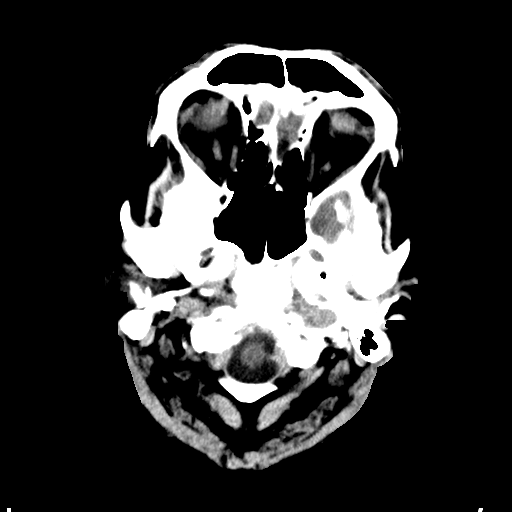
[im 5/34  bone]
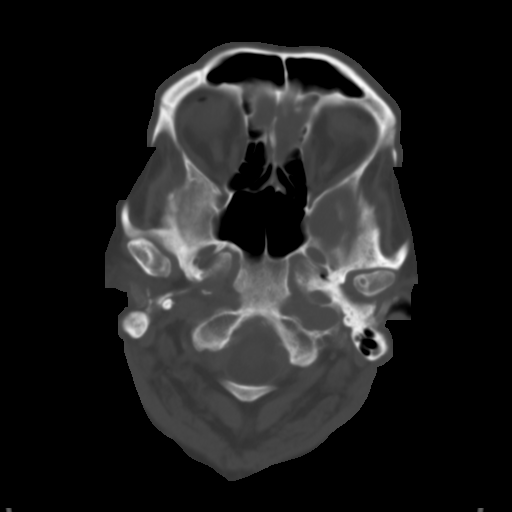
[im 9/34  brain]
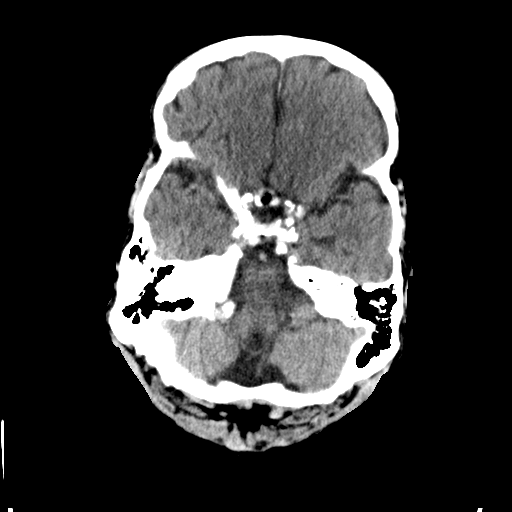
[im 13/34  brain]
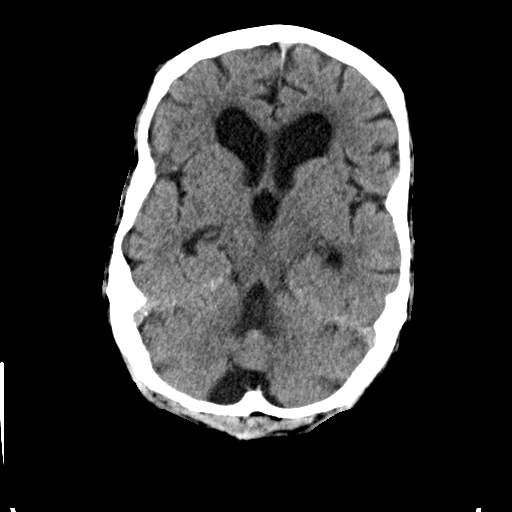
[im 17/34  brain]
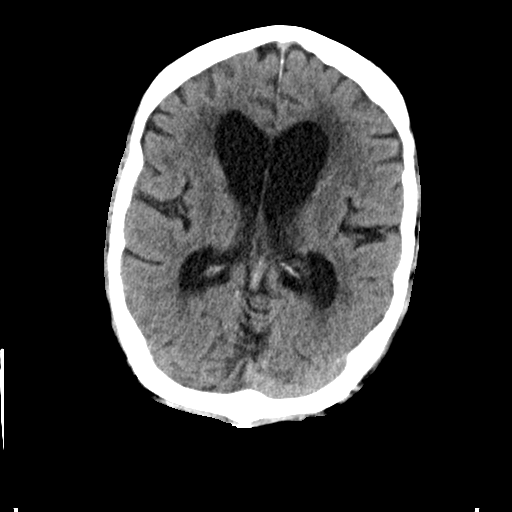
[im 21/34  brain]
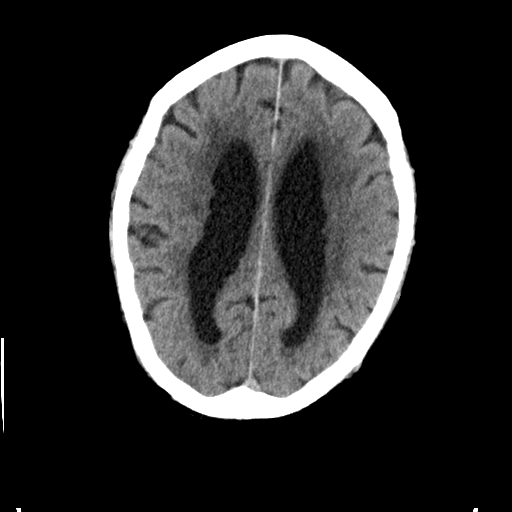
[im 21/34  bone]
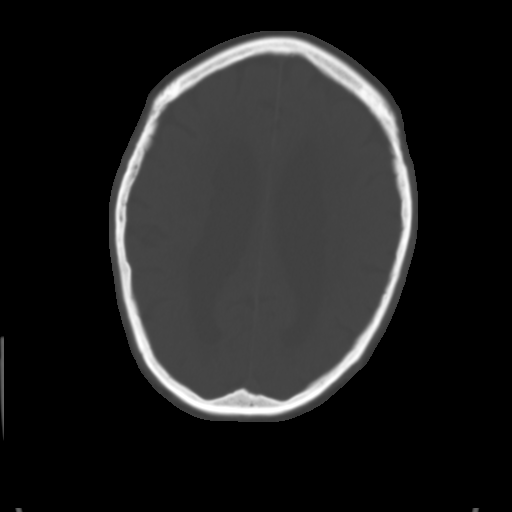
[im 25/34  brain]
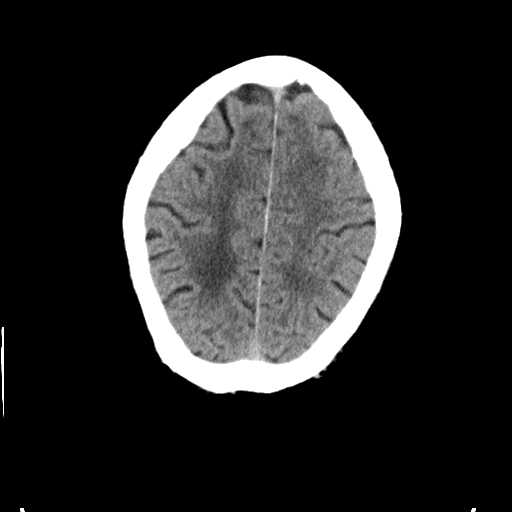
[im 29/34  brain]
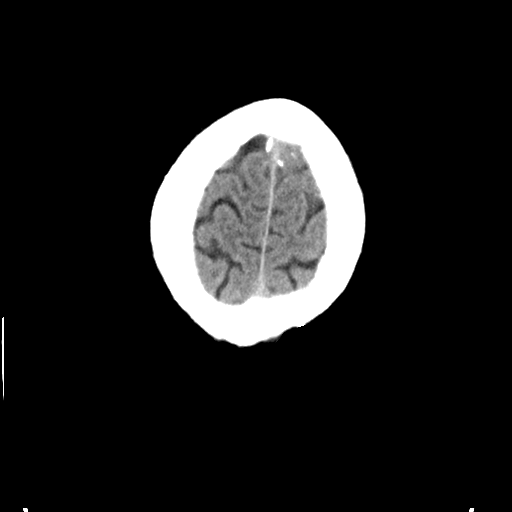

[Series 3: head bone · axial · 0.47mm/px · z∈[-136,-78]mm · 4 of 85 slices shown]
[im 9/85  bone]
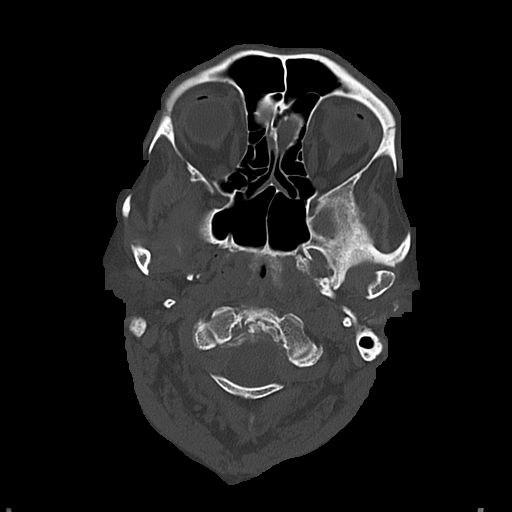
[im 17/85  bone]
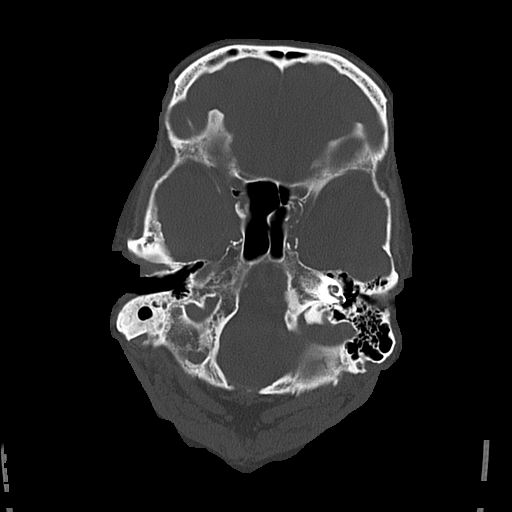
[im 26/85  bone]
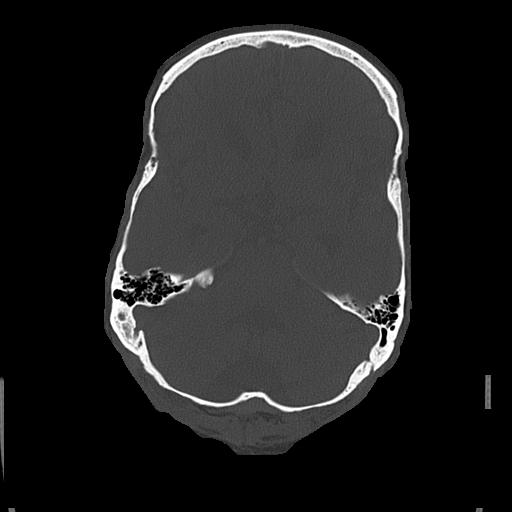
[im 38/85  bone]
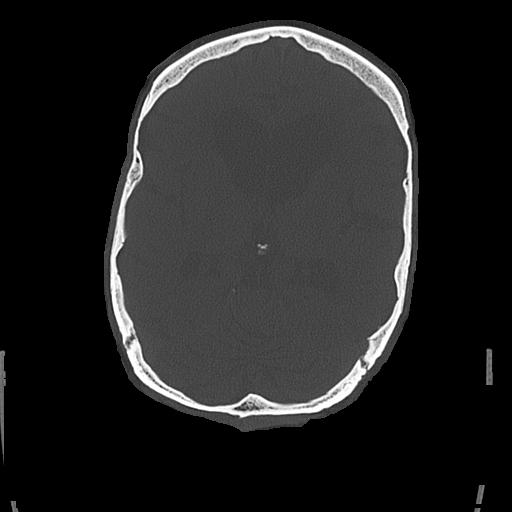

[Series 4: coronal soft · coronal · 0.36mm/px · 3 of 73 slices shown]
[im 29/73  brain]
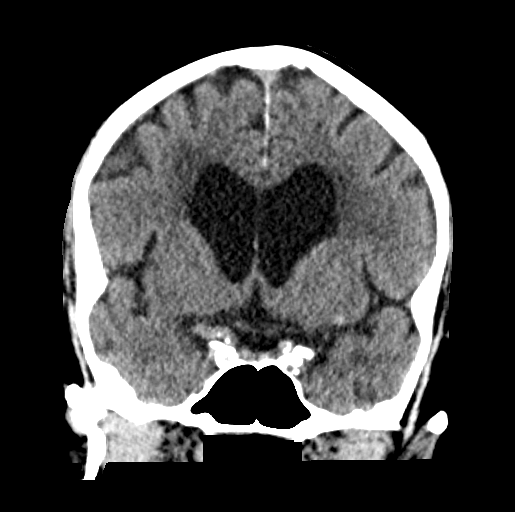
[im 34/73  brain]
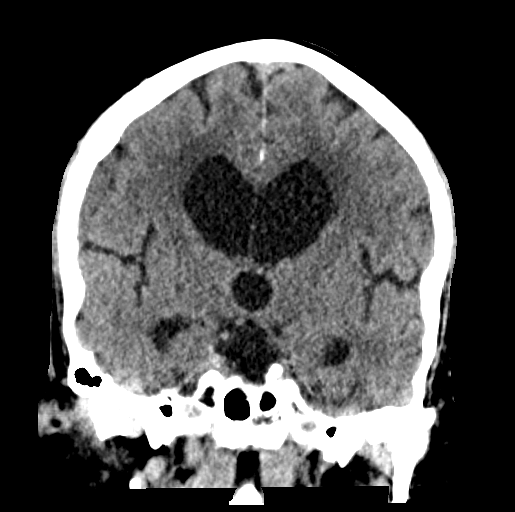
[im 39/73  brain]
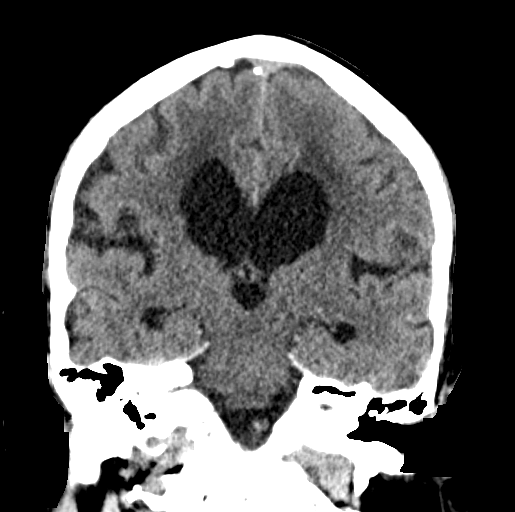

[Series 5: sagittal soft · sagittal · 0.36mm/px · 3 of 62 slices shown]
[im 21/62  brain]
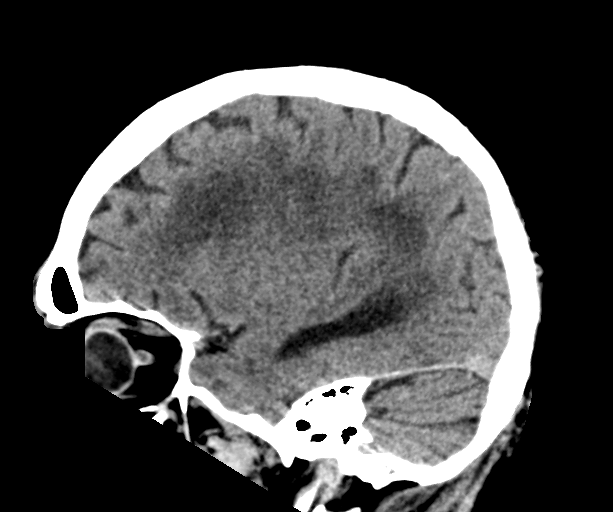
[im 31/62  brain]
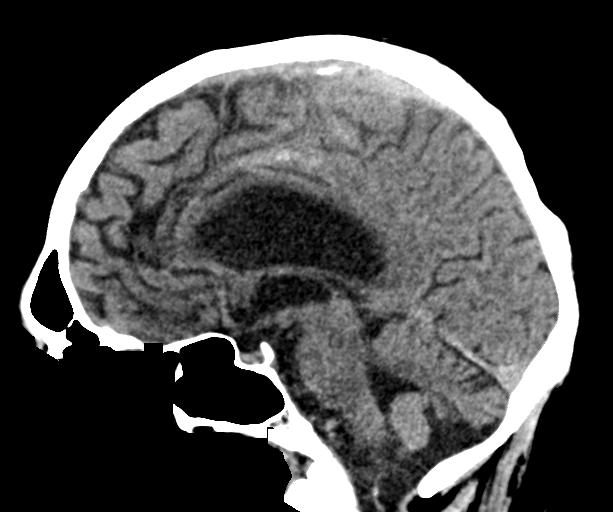
[im 41/62  brain]
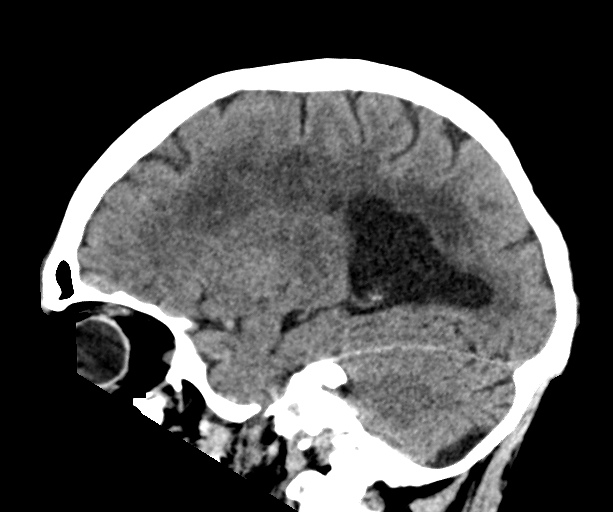

[17 of 47 positions shown; findings below may reference images not displayed]

FINDINGS: Brain: There is no acute intracranial hemorrhage, mass effect, or
edema. Gray-white differentiation is preserved. There is no
extra-axial fluid collection. Prominence of the ventricles and sulci
reflects similar parenchymal volume loss. As before, there is
disproportionate ventricular prominence likely on an ex vacuo basis
rather than hydrocephalus. Patchy and confluent hypoattenuation in
the supratentorial white matter probably reflects stable chronic
microvascular ischemic changes.

Vascular: There is atherosclerotic calcification at the skull base.

Skull: Calvarium is unremarkable.

Sinuses/Orbits: No acute finding.

Other: None.
IMPRESSION: No evidence of acute intracranial injury.

## 2021-05-14 MED ORDER — INSULIN ASPART 100 UNIT/ML IJ SOLN
8.0000 [IU] | Freq: Once | INTRAMUSCULAR | Status: AC
  Administered 2021-05-14: 8 [IU] via SUBCUTANEOUS

## 2021-05-14 MED ORDER — METHOCARBAMOL 500 MG PO TABS: 500.0000 mg | ORAL_TABLET | Freq: Two times a day (BID) | ORAL | 0 refills | Status: DC | PRN

## 2021-05-14 MED ORDER — METHOCARBAMOL 500 MG PO TABS
500.0000 mg | ORAL_TABLET | Freq: Once | ORAL | Status: AC
  Administered 2021-05-14: 500 mg via ORAL
  Filled 2021-05-14: qty 1

## 2021-05-14 MED ORDER — LIDOCAINE 5 % EX PTCH
1.0000 | MEDICATED_PATCH | CUTANEOUS | Status: DC
  Administered 2021-05-14: 1 via TRANSDERMAL
  Filled 2021-05-14: qty 1

## 2021-05-14 MED ORDER — LACTATED RINGERS IV BOLUS
1000.0000 mL | Freq: Once | INTRAVENOUS | Status: AC
  Administered 2021-05-14: 1000 mL via INTRAVENOUS

## 2021-05-14 MED ORDER — KETOROLAC TROMETHAMINE 15 MG/ML IJ SOLN
15.0000 mg | Freq: Once | INTRAMUSCULAR | Status: AC
  Administered 2021-05-14: 15 mg via INTRAVENOUS
  Filled 2021-05-14: qty 1

## 2021-05-14 MED ORDER — HYDROCODONE-ACETAMINOPHEN 5-325 MG PO TABS
2.0000 | ORAL_TABLET | Freq: Once | ORAL | Status: AC
  Administered 2021-05-14: 2 via ORAL
  Filled 2021-05-14: qty 2

## 2021-05-14 MED ORDER — ONDANSETRON 4 MG PO TBDP
4.0000 mg | ORAL_TABLET | Freq: Once | ORAL | Status: AC
  Administered 2021-05-14: 4 mg via ORAL
  Filled 2021-05-14: qty 1

## 2021-05-14 NOTE — ED Provider Notes (Signed)
[facility] EMERGENCY DEPT Provider Note   CSN: [id number] Arrival date & time: 05/14/21  [id number]     History Chief Complaint  Patient presents with   Fall   Back Pain    William Hunter is a 75 y.o. male.   Fall Pertinent negatives include no chest pain, no abdominal pain, no headaches and no shortness of breath.  Back Pain Associated symptoms: no abdominal pain, no chest pain, no dysuria, no fever, no headaches, no numbness and no weakness   Patient presents after 2 separate falls at home.  Yesterday he describes a mechanical fall while getting up from the toilet.  This morning he describes a mechanical fall while getting up from his recliner.  During this fall this morning, he fell backwards, striking his back against the corner of a door.  He states that he has had gait imbalance over the past year.  He does not feel that this is recently worsened.  Since the fall today, he endorses pain in the level of his mid back.  He has not taken anything for this pain.  He does have a history of diabetes.  EMS noted blood glucose of 364 prior to arrival.    Past Medical History:  Diagnosis Date   Anginal pain ([place])    Arthritis    "fingers" (03/09/2012)   Cold feet    "from my diabetes; they stay cold" (03/09/2012)   Coronary artery disease    Coronary artery spasm [facility])    since age 20 Dr Glade Lloyd   Dyslipidemia    Headache(784.0)    Hx of gallstones    Dr Dalbert Batman lap cholecystectomy   Hx of plastic surgery    plastic surgery following a fall off a loading dock ,lost  all his upper teeth    Hypertension    MI, old    at age 73   Migraines    Pneumonia ~ 1962; 1970's   "double"; "regular" (03/09/2012)   Rheumatoid arthritis ([place])    Dr Amil Amen    Slipped cervical disc 2008   Dr Gaynelle Arabian in the past/ Dr Roderic Scarce , chiropractor in 2008   Thyroid disease    Dr Dwyane Dee   Type II diabetes mellitus [facility])     Patient Active Problem List   Diagnosis Date Noted   Statin  myopathy 02/22/2020   Acute respiratory failure with hypoxia ([place]) 07/03/2019   Acute respiratory failure due to COVID-19 ([place]) 07/03/2019   Migraine with aura and without status migrainosus, not intractable 09/02/2016   Diabetic polyneuropathy associated with type 2 diabetes mellitus ([place]) 09/02/2016   Rheumatoid arthritis ([place]) 09/02/2016   Coronary vasospasm ([place]) 04/06/2014   Gallstones 03/10/2012   Abdominal pain 03/09/2012   MI, old    Coronary artery disease    Hypertension    Dyslipidemia     Past Surgical History:  Procedure Laterality Date   CARDIAC CATHETERIZATION     x 3 with NO stents    CHOLECYSTECTOMY  03/11/2012   Procedure: LAPAROSCOPIC CHOLECYSTECTOMY WITH INTRAOPERATIVE CHOLANGIOGRAM;  Surgeon: Adin Hector, MD;  Location: [facility] OR;  Service: General;  Laterality: N/A;   PENILE PROSTHESIS IMPLANT  1983       Family History  Problem Relation Age of Onset   Heart failure Mother    Cancer - Other Mother        breast   Heart disease Father    Cancer - Other Sister        tongue  cancer   Heart attack Sister     Social History   Tobacco Use   Smoking status: Never   Smokeless tobacco: Never  Substance Use Topics   Alcohol use: No   Drug use: No    Home Medications Prior to Admission medications   Medication Sig Start Date End Date Taking? Authorizing Provider  methocarbamol (ROBAXIN) 500 MG tablet Take 1 tablet (500 mg total) by mouth every 12 (twelve) hours as needed for muscle spasms. 05/14/21  Yes Godfrey Pick, MD  Adalimumab (HUMIRA) 40 MG/0.4ML PSKT Inject 40 mg into the skin every 14 (fourteen) days. Every other Thursday    [provider]  diltiazem (TIAZAC) 300 MG 24 hr capsule Take 1 capsule (300 mg total) by mouth daily. 10/18/20   Jerline Pain, MD  ibuprofen (ADVIL) 200 MG tablet Take 400 mg by mouth daily.    [provider]  insulin aspart (NOVOLOG) 100 UNIT/ML injection Inject 20 Units into the skin 3 (three) times  daily before meals.    [provider]  Insulin Glargine (LANTUS SOLOSTAR) 100 UNIT/ML Solostar Pen Inject 30 Units into the skin at bedtime. 07/06/19   Patrecia Pour, MD  loratadine (CLARITIN) 10 MG tablet Take 10 mg by mouth daily.    [provider]  metFORMIN (GLUCOPHAGE-XR) 500 MG 24 hr tablet Take 1,000 mg by mouth 2 (two) times daily. 04/08/19   [provider]  OVER THE COUNTER MEDICATION Apply 1 application topically daily. Hemp cream - for pain    [provider]  sulfaSALAzine (AZULFIDINE) 500 MG tablet Take 1,500 mg by mouth 2 (two) times daily. 02/23/19   [provider]  traZODone (DESYREL) 50 MG tablet Take 1 tablet (50 mg total) by mouth at bedtime as needed for sleep. 07/06/19   Patrecia Pour, MD    Allergies    Codeine, Penicillins, Prednisone, and Wool alcohol [lanolin]  Review of Systems   Review of Systems  Constitutional:  Negative for chills, diaphoresis, fatigue and fever.  HENT:  Negative for congestion, ear pain and sore throat.   Eyes:  Negative for pain and visual disturbance.  Respiratory:  Negative for cough, chest tightness, shortness of breath and wheezing.   Cardiovascular:  Negative for chest pain, palpitations and leg swelling.  Gastrointestinal:  Negative for abdominal pain, diarrhea, nausea and vomiting.  Genitourinary:  Negative for dysuria, flank pain and hematuria.  Musculoskeletal:  Positive for back pain and gait problem (Chronic). Negative for arthralgias, myalgias and neck pain.  Skin:  Negative for color change and rash.  Neurological:  Negative for dizziness, seizures, syncope, weakness, light-headedness, numbness and headaches.  Psychiatric/Behavioral:  Negative for confusion and decreased concentration.   All other systems reviewed and are negative.  Physical Exam Updated Vital Signs BP (!) 141/70 (BP Location: Right Arm)   Pulse 80   Temp 98.5 F (36.9 C) (Oral)   Resp 16   Ht 5' 11.5" (1.816  m)   Wt 79.4 kg   SpO2 98%   BMI 24.07 kg/m   Physical Exam Vitals and nursing note reviewed.  Constitutional:      General: He is not in acute distress.    Appearance: Normal appearance. He is well-developed and normal weight. He is not ill-appearing, toxic-appearing or diaphoretic.  HENT:     Head: Normocephalic and atraumatic.     Right Ear: External ear normal.     Left Ear: External ear normal.     Nose:  Nose normal. No congestion.     Mouth/Throat:     Mouth: Mucous membranes are moist.     Pharynx: Oropharynx is clear.  Eyes:     General: No scleral icterus.    Extraocular Movements: Extraocular movements intact.     Conjunctiva/sclera: Conjunctivae normal.  Cardiovascular:     Rate and Rhythm: Normal rate and regular rhythm.     Heart sounds: No murmur heard. Pulmonary:     Effort: Pulmonary effort is normal. No respiratory distress.     Breath sounds: Normal breath sounds. No wheezing or rales.  Chest:     Chest wall: No tenderness.  Abdominal:     Palpations: Abdomen is soft.     Tenderness: There is no abdominal tenderness.  Musculoskeletal:        General: Tenderness (Thoracic and lumbar areas of back) present. No swelling or deformity.     Cervical back: Normal range of motion and neck supple. No rigidity.     Right lower leg: No edema.     Left lower leg: No edema.  Skin:    General: Skin is warm and dry.     Capillary Refill: Capillary refill takes less than 2 seconds.     Coloration: Skin is not jaundiced or pale.     Findings: Bruising (Right side of thoracic back) present.     Comments: Healing abrasion to right knee  Neurological:     General: No focal deficit present.     Mental Status: He is alert and oriented to person, place, and time.     Cranial Nerves: No cranial nerve deficit.     Sensory: No sensory deficit.     Motor: No weakness.     Coordination: Coordination normal.  Psychiatric:        Mood and Affect: Mood normal. Affect is flat.         Speech: Speech normal.        Behavior: Behavior normal. Behavior is cooperative.    ED Results / Procedures / Treatments   Labs (all labs ordered are listed, but only abnormal results are displayed) Labs Reviewed  COMPREHENSIVE METABOLIC PANEL - Abnormal; Notable for the following components:      Result Value   Sodium 133 (*)    Chloride 97 (*)    Glucose, Bld 348 (*)    AST 12 (*)    All other components within normal limits  CBC - Abnormal; Notable for the following components:   WBC 11.3 (*)    All other components within normal limits  CBG MONITORING, ED - Abnormal; Notable for the following components:   Glucose-Capillary 286 (*)    All other components within normal limits  RESP PANEL BY RT-PCR (FLU A&B, COVID) [id number]  PROTIME-INR  MAGNESIUM  TROPONIN I (HIGH SENSITIVITY)  TROPONIN I (HIGH SENSITIVITY)    EKG EKG Interpretation  Date/Time:  Monday May 14 2021 08:47:49 EST Ventricular Rate:  80 PR Interval:  156 QRS Duration: 82 QT Interval:  373 QTC Calculation: 425 R Axis:   16 Text Interpretation: Sinus rhythm Ventricular premature complex Abnormal R-wave progression, early transition Inferior infarct, age indeterminate Lateral leads are also involved Confirmed by Godfrey Pick [phone]) on 05/14/2021 9:19:19 AM  Radiology CT HEAD WO CONTRAST  Result Date: 05/14/2021 CLINICAL DATA:  Head trauma, minor (Age >= 65y) EXAM: CT HEAD WITHOUT CONTRAST TECHNIQUE: Contiguous axial images were obtained from the base of the skull through the vertex without intravenous contrast. COMPARISON:  07/02/2019 FINDINGS: Brain: There is no acute intracranial hemorrhage, mass effect, or edema. Gray-white differentiation is preserved. There is no extra-axial fluid collection. Prominence of the ventricles and sulci reflects similar parenchymal volume loss. As before, there is disproportionate ventricular prominence likely on an ex vacuo basis rather than hydrocephalus. Patchy and  confluent hypoattenuation in the supratentorial white matter probably reflects stable chronic microvascular ischemic changes. Vascular: There is atherosclerotic calcification at the skull base. Skull: Calvarium is unremarkable. Sinuses/Orbits: No acute finding. Other: None. IMPRESSION: No evidence of acute intracranial injury. Electronically Signed   By: Guadlupe Spanish M.D.   On: 05/14/2021 09:58   CT CERVICAL SPINE WO CONTRAST  Result Date: 05/14/2021 CLINICAL DATA:  Neck trauma (Age >= 65y); mechanical falls EXAM: CT CERVICAL SPINE WITHOUT CONTRAST TECHNIQUE: Multidetector CT imaging of the cervical spine was performed without intravenous contrast. Multiplanar CT image reconstructions were also generated. COMPARISON:  None. FINDINGS: Alignment: Minor degenerative listhesis. Skull base and vertebrae: Multilevel degenerative plate irregularity. No acute fracture. Soft tissues and spinal canal: No prevertebral fluid or swelling. No visible canal hematoma. Disc levels: Multilevel degenerative changes are present including disc space narrowing, endplate osteophytes, and facet and uncovertebral hypertrophy. Upper chest: Dictated separately. Other: Unremarkable. IMPRESSION: No acute cervical spine fracture. Electronically Signed   By: Guadlupe Spanish M.D.   On: 05/14/2021 10:09   CT T-SPINE NO CHARGE  Result Date: 05/14/2021 CLINICAL DATA:  Fall with back pain. EXAM: CT THORACIC SPINE WITHOUT CONTRAST TECHNIQUE: Multidetector CT images of the thoracic were obtained using the standard protocol without intravenous contrast. COMPARISON:  07/02/2019 FINDINGS: Alignment: Or normal Vertebrae: There is an acute superior endplate fracture at T5 with loss of height of only 20%. No retropulsed bone. No other thoracic region traumatic finding. Paraspinal and other soft tissues: Negative other than aortic atherosclerosis. Disc levels: No significant disc level pathology. No stenosis of the canal or foramina. IMPRESSION: Acute  superior endplate fracture at T5 with loss of height of 20%. No retropulsed bone. Electronically Signed   By: Paulina Fusi M.D.   On: 05/14/2021 10:05   CT L-SPINE NO CHARGE  Result Date: 05/14/2021 CLINICAL DATA:  Fall with back pain EXAM: CT LUMBAR SPINE WITHOUT CONTRAST TECHNIQUE: Multidetector CT imaging of the lumbar spine was performed without intravenous contrast administration. Multiplanar CT image reconstructions were also generated. COMPARISON:  07/02/2019 FINDINGS: Segmentation: 5 lumbar type vertebral bodies. Alignment: Normal. Vertebrae: No fracture or focal bone lesion. Paraspinal and other soft tissues: Negative except for aortic atherosclerotic calcification. Disc levels: No significant disc level pathology at L1-2, L2-3 or L3-4. L4-5: Moderate bulging of the disc. No apparent compressive stenosis. L5-S1: Mild bulging of the disc. Facet degeneration left worse than right. No apparent compressive stenosis. IMPRESSION: No acute or traumatic finding. Fairly mild lower lumbar degenerative changes considering age. No change since 2021. Electronically Signed   By: Paulina Fusi M.D.   On: 05/14/2021 10:02   CT CHEST ABDOMEN PELVIS WO CONTRAST  Result Date: 05/14/2021 CLINICAL DATA:  Falls.  Initial encounter. EXAM: CT CHEST, ABDOMEN AND PELVIS WITHOUT CONTRAST TECHNIQUE: Multidetector CT imaging of the chest, abdomen and pelvis was performed following the standard protocol without IV contrast. COMPARISON:  07/02/2019. FINDINGS: CT CHEST FINDINGS Cardiovascular: Atherosclerotic calcification of the aorta, aortic valve and coronary arteries. Heart size normal. No pericardial effusion. Mediastinum/Nodes: No pathologically enlarged mediastinal or axillary lymph nodes. Hilar regions are difficult to evaluate without IV contrast but appear grossly unremarkable. Esophagus is grossly unremarkable. Lungs/Pleura: Calcified  granuloma in the superior segment left lower lobe. Minimal dependent atelectasis  bilaterally. Minimal scarring in the lingula. No pleural fluid. Airway is unremarkable. Musculoskeletal: Old right rib fractures. T5 superior endplate compression fracture, new from 07/02/2019 but age indeterminate. CT ABDOMEN PELVIS FINDINGS Hepatobiliary: Liver is unremarkable. Cholecystectomy. No biliary ductal dilatation. Pancreas: Negative. Spleen: Negative. Adrenals/Urinary Tract: Adrenal glands are unremarkable. 2.3 cm low-attenuation lesion in the interpolar right kidney is likely a cyst. Kidneys are otherwise unremarkable. Ureters are decompressed. Bladder is grossly unremarkable. Stomach/Bowel: Stomach, small bowel, appendix and colon are unremarkable. Vascular/Lymphatic: Atherosclerotic calcification of the aorta. No pathologically enlarged lymph nodes. Reproductive: Prostate is visualized. Other: Tiny bilateral inguinal hernias contain fat. No free fluid. Mesenteries and peritoneum are unremarkable. Musculoskeletal: Degenerative changes in the hips and spine. No fracture. No worrisome lytic or sclerotic lesions. IMPRESSION: 1. T5 superior endplate compression fracture is age indeterminate. CT thoracic spine dictated separately. 2. No additional evidence of acute trauma to the chest, abdomen or pelvis. 3. Aortic atherosclerosis (ICD10-I70.0). Coronary artery calcification. Electronically Signed   By: Lorin Picket M.D.   On: 05/14/2021 10:04    Procedures Procedures   Medications Ordered in ED Medications  lactated ringers bolus 1,000 mL (0 mLs Intravenous Stopped 05/14/21 1019)  HYDROcodone-acetaminophen (NORCO/VICODIN) 5-325 MG per tablet 2 tablet (2 tablets Oral Given 05/14/21 0914)  methocarbamol (ROBAXIN) tablet 500 mg (500 mg Oral Given 05/14/21 1207)  ketorolac (TORADOL) 15 MG/ML injection 15 mg (15 mg Intravenous Given 05/14/21 1208)  ondansetron (ZOFRAN-ODT) disintegrating tablet 4 mg (4 mg Oral Given 05/14/21 1301)  insulin aspart (novoLOG) injection 8 Units (8 Units Subcutaneous Given  05/14/21 1446)    ED Course  I have reviewed the triage vital signs and the nursing notes.  Pertinent labs & imaging results that were available during my care of the patient were reviewed by me and considered in my medical decision making (see chart for details).    MDM Rules/Calculators/A&P                          75 year old male presenting after mechanical fall at home.  On arrival, vital signs are normal and patient is alert and oriented.  Physical exam is notable for bruising to the right aspect of his thoracic back.This is the area of his pain.  Tylenol was given for analgesia.  Patient to undergo work-up, including trauma imaging.  Bolus of IV fluids was given for his hyperglycemia. Imaging study showed a acute superior endplate fracture at T5 with a 20% height loss and no retropulsed bone.  Imaging studies were negative for any other acute injuries.  Patient was informed of imaging results.  Patient declined TLSO brace.  Additional multimodal pain medications were provided.  On reassessment, patient reported significant improvement in his pain.  Following IV fluids, patient's blood sugar remained elevated and he was given corrective dose of insulin.  While in the ED, he did experience some nausea and vomiting.  He believes this is from not eating all day.  Patient was provided with a high-protein/low glycemic snack.  He was advised to continue pain control at home with over-the-counter medications.  Additionally, a prescription for Robaxin was provided.  Patient was advised to follow-up with neurosurgery in 4 weeks for injury reassessment and return to the ED if he does experience any new or worsening symptoms.  He was discharged in stable condition.  Final Clinical Impression(s) / ED Diagnoses Final diagnoses:  Fall  Compression fracture of T5 vertebra, initial encounter ([place])    Rx / DC Orders ED Discharge Orders          Ordered    methocarbamol (ROBAXIN) 500 MG tablet  Every 12  hours PRN        05/14/21 1508             Godfrey Pick, MD 05/15/21 [phone]

## 2021-05-14 NOTE — ED Triage Notes (Signed)
Pt BIB [facility] from home for 2 different mechanical falls. Pt reports he slipped and lost his balance while getting up from the toilet yesterday. This morning he fell while getting up from the recliner hitting his lower back on the window seal. Pt wearing thick "grip socks". Pt endorses low back pain and feeling unsteady.   BP 130/96 HR 70 97% RA  RR 18  CBG 364 Hx of DB, no insulin coverage this am

## 2021-05-14 NOTE — Discharge Instructions (Addendum)
You have a compression fracture to your fifth vertebra of your thoracic spine.  This can be a painful injury.  Take ibuprofen and Tylenol for management of pain.  Additionally, a prescription for Robaxin was sent to your pharmacy of choice.  This is a muscle relaxer.  It can cause drowsiness.  Take with caution. There is a number below to call for a neurosurgery follow-up appointment.  The neurosurgery doctor can ensure good healing of your spinal injury.  Please return to the emergency department for any new or worsening symptoms.

## 2021-05-14 NOTE — ED Notes (Signed)
Patient had a episode of vomiting. Patient denies nausea at this time stating he just felt the urge to throw up. EDP made aware.

## 2021-05-14 NOTE — ED Notes (Signed)
RN provided AVS using Teachback Method. Patient verbalizes understanding of Discharge Instructions. Opportunity for Questioning and Answers were provided by RN. Patient Discharged from ED. Patient discharged from room and taken to lobby to wait for family for pick up.

## 2021-05-14 NOTE — ED Notes (Signed)
Patient transported to CT 

## 2021-05-24 ENCOUNTER — Other Ambulatory Visit: Payer: Self-pay | Admitting: Neurosurgery

## 2021-05-24 DIAGNOSIS — S22050A Wedge compression fracture of T5-T6 vertebra, initial encounter for closed fracture: Secondary | ICD-10-CM | POA: Diagnosis not present

## 2021-05-24 DIAGNOSIS — Z6826 Body mass index (BMI) 26.0-26.9, adult: Secondary | ICD-10-CM | POA: Diagnosis not present

## 2021-05-24 DIAGNOSIS — R030 Elevated blood-pressure reading, without diagnosis of hypertension: Secondary | ICD-10-CM | POA: Diagnosis not present

## 2021-05-28 ENCOUNTER — Other Ambulatory Visit (HOSPITAL_COMMUNITY)
Admission: RE | Admit: 2021-05-28 | Discharge: 2021-05-28 | Disposition: A | Discharge status: Home / Self Care | Payer: Medicare HMO | Source: Ambulatory Visit | Attending: Neurosurgery | Admitting: Neurosurgery

## 2021-05-28 DIAGNOSIS — Z01812 Encounter for preprocedural laboratory examination: Secondary | ICD-10-CM | POA: Diagnosis not present

## 2021-05-28 DIAGNOSIS — Z20822 Contact with and (suspected) exposure to covid-19: Secondary | ICD-10-CM | POA: Insufficient documentation

## 2021-05-28 DIAGNOSIS — Z01818 Encounter for other preprocedural examination: Secondary | ICD-10-CM

## 2021-05-28 LAB — SARS CORONAVIRUS 2 (TAT 6-24 HRS): SARS Coronavirus 2: NEGATIVE

## 2021-05-29 ENCOUNTER — Other Ambulatory Visit: Payer: Self-pay

## 2021-05-29 ENCOUNTER — Encounter (HOSPITAL_COMMUNITY): Payer: Self-pay | Admitting: Neurosurgery

## 2021-05-29 NOTE — Anesthesia Preprocedure Evaluation (Addendum)
Anesthesia Evaluation  Patient identified by MRN, date of birth, ID band Patient awake    Reviewed: Allergy & Precautions, NPO status , Patient's Chart, lab work & pertinent test results  Airway Mallampati: II  TM Distance: >3 FB Neck ROM: Full    Dental  (+) Dental Advisory Given, Edentulous Upper, Poor Dentition, Missing,    Pulmonary neg pulmonary ROS,    Pulmonary exam normal breath sounds clear to auscultation       Cardiovascular hypertension, Pt. on medications (-) angina+ CAD, + Past MI and + DVT  (-) Cardiac Stents and (-) CABG Normal cardiovascular exam Rhythm:Regular Rate:Normal  Echo 02/22/20: 1. Left ventricular ejection fraction, by estimation, is 60 to 65%. The left ventricle has normal function. The left ventricle has no regional wall motion abnormalities. There is mild concentric left ventricular hypertrophy. Left ventricular diastolic parameters are consistent with Grade I diastolic dysfunction (impaired relaxation). The average left ventricular global longitudinal strain is -16.9 %.  2. Right ventricular systolic function is normal. The right ventricular size is normal. Tricuspid regurgitation signal is inadequate for assessing PA pressure.  3. The mitral valve is grossly normal. No evidence of mitral valve regurgitation. No evidence of mitral stenosis.  4. The aortic valve is tricuspid. There is mild calcification of the aortic valve. Aortic valve regurgitation is not visualized. Mild aortic valve sclerosis is present, with no evidence of aortic valve stenosis.  5. The inferior vena cava is normal in size with greater than 50% respiratory variability, suggesting right atrial pressure of 3 mmHg.    Neuro/Psych  Headaches, T5 compression fracture   Neuromuscular disease    GI/Hepatic negative GI ROS, Neg liver ROS,   Endo/Other  diabetes, Type 2, Insulin Dependent  Renal/GU negative Renal ROS      Musculoskeletal  (+) Arthritis , Rheumatoid disorders,    Abdominal   Peds  Hematology negative hematology ROS (+)   Anesthesia Other Findings Day of surgery medications reviewed with the patient.  Reproductive/Obstetrics                            Anesthesia Physical Anesthesia Plan  ASA: 3  Anesthesia Plan: General   Post-op Pain Management: Tylenol PO (pre-op)   Induction: Intravenous  PONV Risk Score and Plan: 2 and Dexamethasone and Ondansetron  Airway Management Planned: Oral ETT  Additional Equipment:   Intra-op Plan:   Post-operative Plan: Extubation in OR  Informed Consent: I have reviewed the patients History and Physical, chart, labs and discussed the procedure including the risks, benefits and alternatives for the proposed anesthesia with the patient or authorized representative who has indicated his/her understanding and acceptance.     Dental advisory given  Plan Discussed with: CRNA  Anesthesia Plan Comments: (PAT note written 05/29/2021 by Shonna Chock, PA-C. )       Anesthesia Quick Evaluation

## 2021-05-29 NOTE — Progress Notes (Signed)
Anesthesia Chart Review:  Case: [id number] Date/Time: 05/30/21 1237   Procedure: T5 KYPHOPLASTY - 3C/RM 21   Anesthesia type: General   Pre-op diagnosis: Compression fracture of T5 Vertebra   Location: [facility] OR ROOM 21 / [place] OR   Surgeons: Ashok Pall, MD       DISCUSSION: Patient is a 75 year old male scheduled for the above procedure.  He was seen in the ED on 05/14/2021 following 2 separate mechanical falls at home. "Imaging study showed a acute superior endplate fracture at T5 with a 20% height loss and no retropulsed bone." He declined TLSO brace then.  Discharged with neuro surgery follow-up.   History includes never smoker, HTN, CAD (MI age 72, possible vasospasm or lipid rich plaque; no CAD 1981, non ischemia 2013 stress test), dyslipidemia, DM2, RA, COVID-19 (06/2019 with hospitalization), DVT (left profunda vein 07/03/19, possible COVID-related, s/p apixaban x3 months), penile prosthesis (1983), cholecystectomy (03/11/12), oral/plastic surgery (fell off loading dock and last his upper teeth).  Last saw cardiologist Dr. Marlou Porch 08/08/2020 with 1 year follow-up recommended. He is in the [place] 4 study with Dr. Lia Foyer, Caprice Renshaw, MD. From the [facility], "This study is a double-blinded placebo-controlled trial assessing the effects of inclisiran on clinical outcomes among people with atherosclerotic cardiovascular disease. Inclisiran is an FDA approved agent that lowers cholesterol with two subcutaneous injections per year."  Endocrinologist is Dr. Garnet Koyanagi. There is limited review, mostly of lab results and vitals, in Care Everywhere  A1c 14.2% on 09/26/20. Since not unavailable unclear what changes were made. He reported "he checks his blood sugar occasionally. States fasting blood sugar can run between 180-200."  05/28/2021 presurgical COVID-19 test was negative.  Anesthesia team to evaluate on the day of surgery.  He is a same-day work-up.  He had a c-Met and CBC on 05/14/2021.  He will at  least need a CBG on arrival.    VS:  BP Readings from Last 3 Encounters:  05/14/21 (!) 141/70  08/08/20 (!) 160/80  02/07/20 122/80   Pulse Readings from Last 3 Encounters:  05/14/21 80  08/08/20 94  02/07/20 97     PROVIDERS: [name], DO is PCP  Candee Furbish, MD is cardiologist Madelin Rear, MD is endocrinologist. Last visit seen was on 11/21/20 (Care Everywhere). A1c 14.2% on 09/26/20.  Leigh Aurora, MD is rheumatologist   LABS: For day of surgery as indicated. Currently, last results include: Lab Results  Component Value Date   WBC 11.3 (H) 05/14/2021   HGB 15.4 05/14/2021   HCT 45.5 05/14/2021   PLT 298 05/14/2021   GLUCOSE 348 (H) 05/14/2021   CHOL 248 (H) 08/08/2020   TRIG 426 (H) 08/08/2020   HDL 31 (L) 08/08/2020   LDLCALC 139 (H) 08/08/2020   ALT 14 05/14/2021   AST 12 (L) 05/14/2021   NA 133 (L) 05/14/2021   K 4.3 05/14/2021   CL 97 (L) 05/14/2021   CREATININE 0.86 05/14/2021   BUN 19 05/14/2021   CO2 25 05/14/2021   INR 1.0 05/14/2021     IMAGES: CT Chest/abd/pelvis 05/14/21: IMPRESSION: 1. T5 superior endplate compression fracture is age indeterminate. CT thoracic spine dictated separately. 2. No additional evidence of acute trauma to the chest, abdomen or pelvis. 3. Aortic atherosclerosis (ICD10-I70.0). Coronary artery calcification.   CT L-spine 05/14/21: IMPRESSION: No acute or traumatic finding. Fairly mild lower lumbar degenerative changes considering age. No change since 2021.   CT T-spine 05/14/21: IMPRESSION: Acute superior endplate fracture at  T5 with loss of height of 20%. No retropulsed bone.   CT C-spine 05/14/21: IMPRESSION: No acute cervical spine fracture.   CT Head 05/14/21: IMPRESSION: No evidence of acute intracranial injury.   EKG: EKG 05/14/21: Sinus rhythm Ventricular premature complex Abnormal R-wave progression, early transition Inferior infarct, age indeterminate Lateral leads are also  involved Confirmed by Godfrey Pick [phone]) on 05/14/2021 9:19:19 AM - PVCs are new but overall, think EKG is stable when compared to 07/02/19 tracing.   CV: Echo 02/22/20: IMPRESSIONS   1. Left ventricular ejection fraction, by estimation, is 60 to 65%. The  left ventricle has normal function. The left ventricle has no regional  wall motion abnormalities. There is mild concentric left ventricular  hypertrophy. Left ventricular diastolic  parameters are consistent with Grade I diastolic dysfunction (impaired  relaxation). The average left ventricular global longitudinal strain is  -16.9 %.   2. Right ventricular systolic function is normal. The right ventricular  size is normal. Tricuspid regurgitation signal is inadequate for assessing  PA pressure.   3. The mitral valve is grossly normal. No evidence of mitral valve  regurgitation. No evidence of mitral stenosis.   4. The aortic valve is tricuspid. There is mild calcification of the  aortic valve. Aortic valve regurgitation is not visualized. Mild aortic  valve sclerosis is present, with no evidence of aortic valve stenosis.   5. The inferior vena cava is normal in size with greater than 50%  respiratory variability, suggesting right atrial pressure of 3 mmHg.  - Comparison(s): No significant change from prior study.   Nuclear stress test 11/18/12: IMPRESSION:  1.  No fixed or reversible defects to suggest inducible ischemia.  2.  Normal left ventricular wall motion and thickening.  3.  Normal ejection fraction.     Past Medical History:  Diagnosis Date   Anginal pain ([place])    Arthritis    "fingers" (03/09/2012)   Cold feet    "from my diabetes; they stay cold" (03/09/2012)   Coronary artery disease    Coronary artery spasm [facility])    since age 54 Dr Glade Lloyd   COVID 07/06/2019   hospitalizied   Dyslipidemia    Femoral DVT (deep venous thrombosis) ([name]    left   Headache(784.0)    Hx of gallstones    Dr Dalbert Batman lap  cholecystectomy   Hx of plastic surgery    plastic surgery following a fall off a loading dock ,lost  all his upper teeth    Hypertension    MI, old    at age 60   Migraines    Pneumonia ~ 1962; 1970's   "double"; "regular" (03/09/2012)   Rheumatoid arthritis ([place])    Dr Amil Amen    Slipped cervical disc 06/10/2006   Dr Gaynelle Arabian in the past/ Dr Roderic Scarce , chiropractor in 2008   Thyroid disease    Dr Dwyane Dee   Type II diabetes mellitus ([place])     Past Surgical History:  Procedure Laterality Date   CARDIAC CATHETERIZATION     x 3 with NO stents    CHOLECYSTECTOMY  03/11/2012   Procedure: LAPAROSCOPIC CHOLECYSTECTOMY WITH INTRAOPERATIVE CHOLANGIOGRAM;  Surgeon: Adin Hector, MD;  Location: [place];  Service: General;  Laterality: N/A;   PENILE PROSTHESIS IMPLANT  1983    MEDICATIONS: No current facility-administered medications for this encounter.    acetaminophen (TYLENOL) 500 MG tablet   diltiazem (TIAZAC) 300 MG 24 hr capsule   folic acid (FOLVITE) 1 MG tablet  HYDROcodone-acetaminophen (NORCO/VICODIN) 5-325 MG tablet   Insulin Glargine (BASAGLAR KWIKPEN) 100 UNIT/ML   insulin lispro (HUMALOG) 100 UNIT/ML KwikPen   loratadine (CLARITIN) 10 MG tablet   methotrexate (RHEUMATREX) 2.5 MG tablet   Insulin Glargine (LANTUS SOLOSTAR) 100 UNIT/ML Solostar Pen   methocarbamol (ROBAXIN) 500 MG tablet    Myra Gianotti, PA-C Surgical Short Stay/Anesthesiology [facility] Phone [phone] [facility] Phone [phone] 05/29/2021 3:45 PM

## 2021-05-29 NOTE — Progress Notes (Signed)
Spoke with pt for pre-op call. Pt has hx of CAD due to lipid rich plaque. States he's never had any stents placed and no CABG done. His cardiologist is Dr. Anne Fu, last office visit was 08/08/20. Patient is a type 2 diabetic. He does not know what his last A1C was or when he had it done. I have called Dr. Thurnell Lose office to fax the most recent A1C to [place]. He states he checks his blood sugar occasionally. States fasting blood sugar can run between 180-200. Instructed pt to take 1/2 of his regular dose of Basaglar insulin tonight, he will take 20 units and to take 1/2 dose in the AM, he will take 15 units. Instructed him to check his blood sugar when he wakes up and every 2 hours until he leaves for the hospital. If blood sugar is >220 take 1/2 of usual correction dose of Humalog insulin. If blood sugar is 70 or below, treat with 1/2 cup of clear juice (apple or cranberry) and recheck blood sugar 15 minutes after drinking juice. If blood sugar continues to be 70 or below, call the Short Stay department and ask to speak to a nurse. Pt voiced understanding.  Covid test done on 07/29/20 and it was negative.

## 2021-05-30 ENCOUNTER — Ambulatory Visit (HOSPITAL_COMMUNITY): Payer: Medicare HMO | Admitting: Vascular Surgery

## 2021-05-30 ENCOUNTER — Ambulatory Visit (HOSPITAL_COMMUNITY): Payer: Medicare HMO

## 2021-05-30 ENCOUNTER — Ambulatory Visit (HOSPITAL_COMMUNITY)
Admission: RE | Admit: 2021-05-30 | Discharge: 2021-05-30 | Disposition: A | Discharge status: Home / Self Care | Payer: Medicare HMO | Attending: Neurosurgery | Admitting: Neurosurgery

## 2021-05-30 ENCOUNTER — Other Ambulatory Visit: Payer: Self-pay

## 2021-05-30 ENCOUNTER — Encounter (HOSPITAL_COMMUNITY)
Admission: RE | Disposition: A | Discharge status: Home / Self Care | Payer: Self-pay | Source: Home / Self Care | Attending: Neurosurgery

## 2021-05-30 ENCOUNTER — Encounter (HOSPITAL_COMMUNITY): Payer: Self-pay | Admitting: Neurosurgery

## 2021-05-30 DIAGNOSIS — I119 Hypertensive heart disease without heart failure: Secondary | ICD-10-CM | POA: Diagnosis not present

## 2021-05-30 DIAGNOSIS — E1140 Type 2 diabetes mellitus with diabetic neuropathy, unspecified: Secondary | ICD-10-CM | POA: Diagnosis not present

## 2021-05-30 DIAGNOSIS — S22050A Wedge compression fracture of T5-T6 vertebra, initial encounter for closed fracture: Secondary | ICD-10-CM | POA: Diagnosis not present

## 2021-05-30 DIAGNOSIS — M069 Rheumatoid arthritis, unspecified: Secondary | ICD-10-CM | POA: Insufficient documentation

## 2021-05-30 DIAGNOSIS — I2510 Atherosclerotic heart disease of native coronary artery without angina pectoris: Secondary | ICD-10-CM | POA: Diagnosis not present

## 2021-05-30 DIAGNOSIS — I358 Other nonrheumatic aortic valve disorders: Secondary | ICD-10-CM | POA: Insufficient documentation

## 2021-05-30 DIAGNOSIS — Z794 Long term (current) use of insulin: Secondary | ICD-10-CM | POA: Diagnosis not present

## 2021-05-30 DIAGNOSIS — E119 Type 2 diabetes mellitus without complications: Secondary | ICD-10-CM | POA: Insufficient documentation

## 2021-05-30 DIAGNOSIS — I10 Essential (primary) hypertension: Secondary | ICD-10-CM | POA: Diagnosis not present

## 2021-05-30 DIAGNOSIS — Z419 Encounter for procedure for purposes other than remedying health state, unspecified: Secondary | ICD-10-CM

## 2021-05-30 DIAGNOSIS — M4854XA Collapsed vertebra, not elsewhere classified, thoracic region, initial encounter for fracture: Secondary | ICD-10-CM | POA: Diagnosis not present

## 2021-05-30 DIAGNOSIS — M5136 Other intervertebral disc degeneration, lumbar region: Secondary | ICD-10-CM | POA: Insufficient documentation

## 2021-05-30 DIAGNOSIS — M8008XA Age-related osteoporosis with current pathological fracture, vertebra(e), initial encounter for fracture: Secondary | ICD-10-CM | POA: Diagnosis not present

## 2021-05-30 DIAGNOSIS — W19XXXA Unspecified fall, initial encounter: Secondary | ICD-10-CM | POA: Diagnosis not present

## 2021-05-30 DIAGNOSIS — Z9889 Other specified postprocedural states: Secondary | ICD-10-CM | POA: Diagnosis not present

## 2021-05-30 HISTORY — DX: Acute embolism and thrombosis of unspecified femoral vein: I82.419

## 2021-05-30 HISTORY — PX: KYPHOPLASTY: SHX5884

## 2021-05-30 LAB — GLUCOSE, CAPILLARY
Glucose-Capillary: 236 mg/dL — ABNORMAL HIGH (ref 70–99)
Glucose-Capillary: 202 mg/dL — ABNORMAL HIGH (ref 70–99)
Glucose-Capillary: 254 mg/dL — ABNORMAL HIGH (ref 70–99)
Glucose-Capillary: 204 mg/dL — ABNORMAL HIGH (ref 70–99)

## 2021-05-30 LAB — SURGICAL PCR SCREEN
MRSA, PCR: NEGATIVE
Staphylococcus aureus: NEGATIVE

## 2021-05-30 IMAGING — RF DG THORACIC SPINE 2V
1 series · 1 of 1 positions shown · non-contrast
Comparison: Thoracic spine CT [DATE].

CLINICAL DATA: Kyphoplasty T5

EXAM:
THORACIC SPINE 2 VIEWS

[Series 1: run · 1 of 1 slices shown]
[im 1/1]
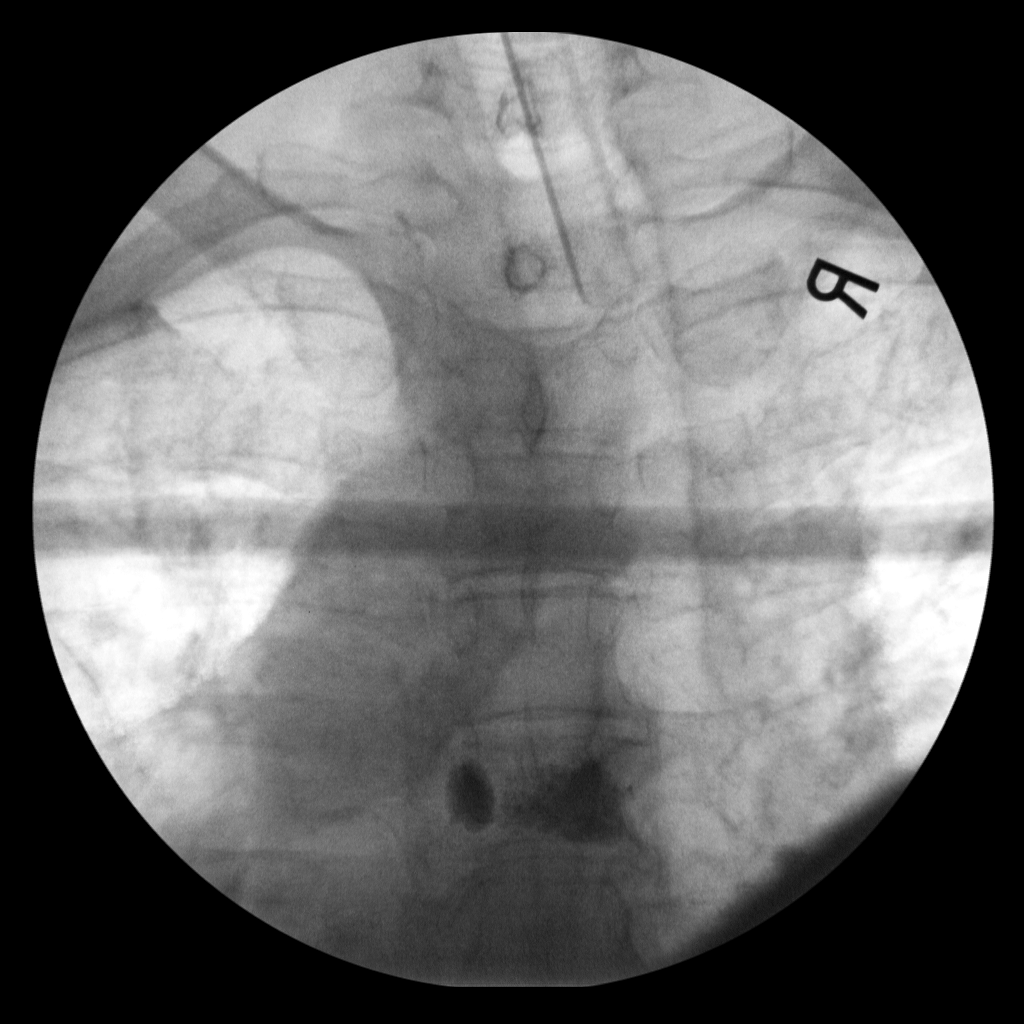

[1 of 1 positions shown; findings below may reference images not displayed]

FINDINGS: Intraoperative thoracic spine.

One low resolution intraoperative spot views of the thoracic spine
were obtained. T5 kyphoplasty.

Total fluoroscopy time: 2 minutes 27 seconds

Total radiation dose: 73.46 micro Gy
IMPRESSION: Intraoperative T5 kyphoplasty.

## 2021-05-30 IMAGING — RF DG THORACIC SPINE 2V
1 series · 1 of 1 positions shown · non-contrast
Comparison: Thoracic spine CT [DATE].

CLINICAL DATA: Kyphoplasty T5

EXAM:
THORACIC SPINE 2 VIEWS

[Series 1: run · 1 of 1 slices shown]
[im 1/1]
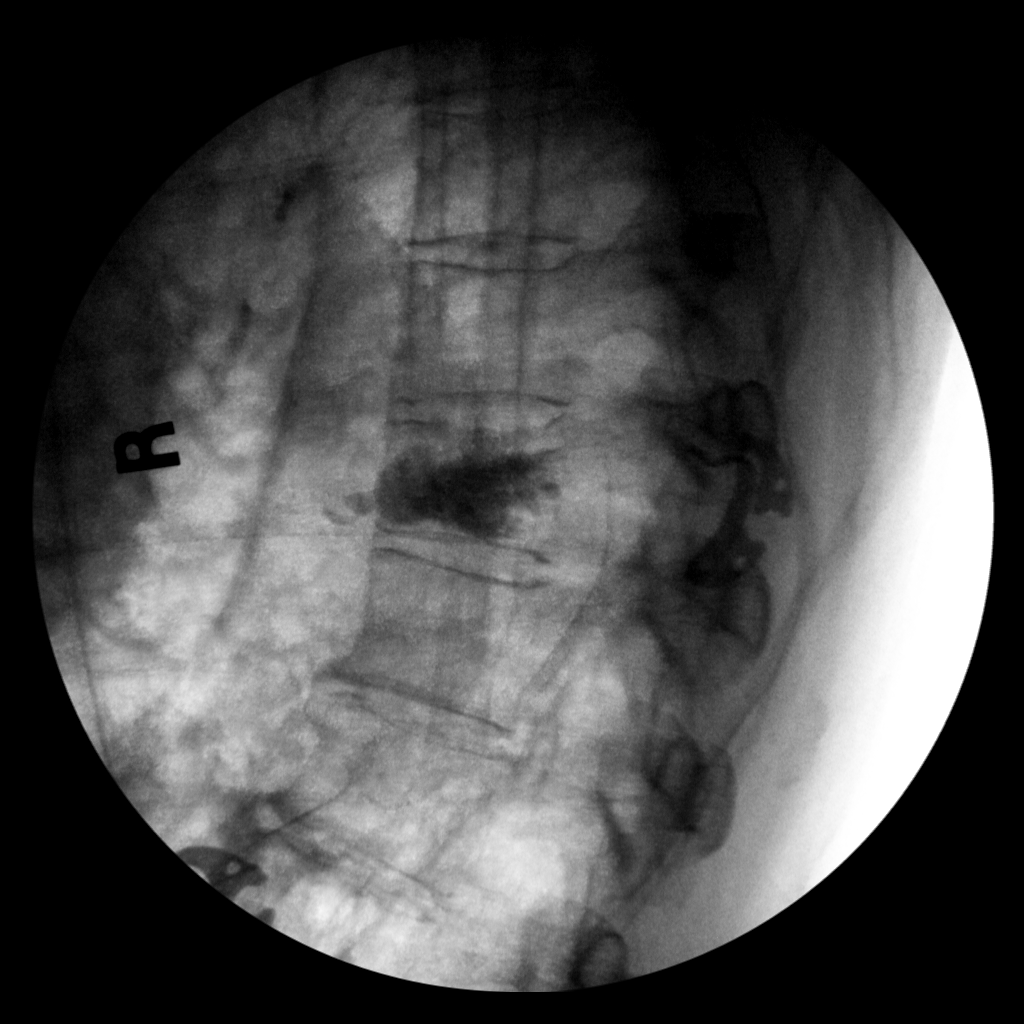

[1 of 1 positions shown; findings below may reference images not displayed]

FINDINGS: Intraoperative thoracic spine.

One low resolution intraoperative spot views of the thoracic spine
were obtained. T5 kyphoplasty.

Total fluoroscopy time: 2 minutes 27 seconds

Total radiation dose: 73.46 micro Gy
IMPRESSION: Intraoperative T5 kyphoplasty.

## 2021-05-30 SURGERY — KYPHOPLASTY
Anesthesia: General

## 2021-05-30 MED ORDER — DEXAMETHASONE SODIUM PHOSPHATE 10 MG/ML IJ SOLN
INTRAMUSCULAR | Status: AC
  Filled 2021-05-30: qty 1

## 2021-05-30 MED ORDER — ONDANSETRON HCL 4 MG/2ML IJ SOLN: 4.0000 mg | Freq: Once | INTRAMUSCULAR | Status: DC | PRN

## 2021-05-30 MED ORDER — INSULIN ASPART 100 UNIT/ML IJ SOLN
6.0000 [IU] | Freq: Once | INTRAMUSCULAR | Status: AC
  Administered 2021-05-30: 15:00:00 6 [IU] via SUBCUTANEOUS

## 2021-05-30 MED ORDER — ACETAMINOPHEN 500 MG PO TABS
ORAL_TABLET | ORAL | Status: AC
  Filled 2021-05-30: qty 2

## 2021-05-30 MED ORDER — PHENYLEPHRINE HCL-NACL 20-0.9 MG/250ML-% IV SOLN
INTRAVENOUS | Status: DC | PRN
  Administered 2021-05-30: 25 ug/min via INTRAVENOUS

## 2021-05-30 MED ORDER — INSULIN ASPART 100 UNIT/ML IJ SOLN
INTRAMUSCULAR | Status: AC
  Filled 2021-05-30: qty 1

## 2021-05-30 MED ORDER — FENTANYL CITRATE (PF) 250 MCG/5ML IJ SOLN
INTRAMUSCULAR | Status: DC | PRN
  Administered 2021-05-30: 100 ug via INTRAVENOUS

## 2021-05-30 MED ORDER — LIDOCAINE 2% (20 MG/ML) 5 ML SYRINGE
INTRAMUSCULAR | Status: AC
  Filled 2021-05-30: qty 5

## 2021-05-30 MED ORDER — ONDANSETRON HCL 4 MG/2ML IJ SOLN
INTRAMUSCULAR | Status: AC
  Filled 2021-05-30: qty 2

## 2021-05-30 MED ORDER — 0.9 % SODIUM CHLORIDE (POUR BTL) OPTIME
TOPICAL | Status: DC | PRN
  Administered 2021-05-30: 14:00:00 1000 mL

## 2021-05-30 MED ORDER — SUGAMMADEX SODIUM 200 MG/2ML IV SOLN
INTRAVENOUS | Status: DC | PRN
  Administered 2021-05-30: 200 mg via INTRAVENOUS

## 2021-05-30 MED ORDER — CHLORHEXIDINE GLUCONATE 0.12 % MT SOLN
15.0000 mL | Freq: Once | OROMUCOSAL | Status: AC
  Administered 2021-05-30: 11:00:00 15 mL via OROMUCOSAL
  Filled 2021-05-30: qty 15

## 2021-05-30 MED ORDER — PROPOFOL 10 MG/ML IV BOLUS
INTRAVENOUS | Status: DC | PRN
  Administered 2021-05-30: 100 mg via INTRAVENOUS

## 2021-05-30 MED ORDER — PHENYLEPHRINE 40 MCG/ML (10ML) SYRINGE FOR IV PUSH (FOR BLOOD PRESSURE SUPPORT)
PREFILLED_SYRINGE | INTRAVENOUS | Status: AC
  Filled 2021-05-30: qty 10

## 2021-05-30 MED ORDER — PROPOFOL 10 MG/ML IV BOLUS
INTRAVENOUS | Status: AC
  Filled 2021-05-30: qty 20

## 2021-05-30 MED ORDER — LIDOCAINE-EPINEPHRINE 1 %-1:100000 IJ SOLN
INTRAMUSCULAR | Status: DC | PRN
  Administered 2021-05-30: 7 mL

## 2021-05-30 MED ORDER — ONDANSETRON HCL 4 MG/2ML IJ SOLN
INTRAMUSCULAR | Status: DC | PRN
  Administered 2021-05-30: 4 mg via INTRAVENOUS

## 2021-05-30 MED ORDER — EPHEDRINE 5 MG/ML INJ
INTRAVENOUS | Status: AC
  Filled 2021-05-30: qty 5

## 2021-05-30 MED ORDER — ROCURONIUM BROMIDE 10 MG/ML (PF) SYRINGE
PREFILLED_SYRINGE | INTRAVENOUS | Status: DC | PRN
  Administered 2021-05-30: 50 mg via INTRAVENOUS

## 2021-05-30 MED ORDER — IOPAMIDOL (ISOVUE-300) INJECTION 61%
INTRAVENOUS | Status: DC | PRN
  Administered 2021-05-30: 14:00:00 100 mL

## 2021-05-30 MED ORDER — FENTANYL CITRATE (PF) 250 MCG/5ML IJ SOLN
INTRAMUSCULAR | Status: AC
  Filled 2021-05-30: qty 5

## 2021-05-30 MED ORDER — ROCURONIUM BROMIDE 10 MG/ML (PF) SYRINGE
PREFILLED_SYRINGE | INTRAVENOUS | Status: AC
  Filled 2021-05-30: qty 30

## 2021-05-30 MED ORDER — ORAL CARE MOUTH RINSE: 15.0000 mL | Freq: Once | OROMUCOSAL | Status: AC

## 2021-05-30 MED ORDER — ROCURONIUM BROMIDE 10 MG/ML (PF) SYRINGE
PREFILLED_SYRINGE | INTRAVENOUS | Status: AC
  Filled 2021-05-30: qty 10

## 2021-05-30 MED ORDER — LACTATED RINGERS IV SOLN: INTRAVENOUS | Status: DC

## 2021-05-30 MED ORDER — OXYCODONE HCL 5 MG PO TABS: 5.0000 mg | ORAL_TABLET | Freq: Four times a day (QID) | ORAL | 0 refills | Status: AC | PRN

## 2021-05-30 MED ORDER — FENTANYL CITRATE (PF) 100 MCG/2ML IJ SOLN: 25.0000 ug | INTRAMUSCULAR | Status: DC | PRN

## 2021-05-30 MED ORDER — LIDOCAINE-EPINEPHRINE 1 %-1:100000 IJ SOLN
INTRAMUSCULAR | Status: AC
  Filled 2021-05-30: qty 1

## 2021-05-30 MED ORDER — VANCOMYCIN HCL 1000 MG IV SOLR
INTRAVENOUS | Status: AC
  Filled 2021-05-30: qty 20

## 2021-05-30 MED ORDER — ACETAMINOPHEN 500 MG PO TABS
1000.0000 mg | ORAL_TABLET | Freq: Once | ORAL | Status: AC
  Administered 2021-05-30: 12:00:00 1000 mg via ORAL

## 2021-05-30 MED ORDER — LIDOCAINE 2% (20 MG/ML) 5 ML SYRINGE
INTRAMUSCULAR | Status: DC | PRN
  Administered 2021-05-30: 60 mg via INTRAVENOUS

## 2021-05-30 MED ORDER — VANCOMYCIN HCL 1000 MG IV SOLR
INTRAVENOUS | Status: DC | PRN
  Administered 2021-05-30: 14:00:00 1000 mg via INTRAVENOUS

## 2021-05-30 SURGICAL SUPPLY — 37 items
ADH SKN CLS APL DERMABOND .7 (GAUZE/BANDAGES/DRESSINGS) ×1
BAG COUNTER SPONGE SURGICOUNT (BAG) ×3 IMPLANT
BAG SPNG CNTER NS LX DISP (BAG) ×1
BAG SURGICOUNT SPONGE COUNTING (BAG) ×1
BLADE CLIPPER SURG (BLADE) IMPLANT
BLADE SURG 15 STRL LF DISP TIS (BLADE) ×2 IMPLANT
BLADE SURG 15 STRL SS (BLADE) ×3
CEMENT KYPHON C01A KIT/MIXER (Cement) ×2 IMPLANT
DERMABOND ADVANCED (GAUZE/BANDAGES/DRESSINGS) ×2
DERMABOND ADVANCED .7 DNX12 (GAUZE/BANDAGES/DRESSINGS) ×2 IMPLANT
DRAPE C-ARM 42X72 X-RAY (DRAPES) ×4 IMPLANT
DRAPE HALF SHEET 40X57 (DRAPES) ×4 IMPLANT
DRAPE LAPAROTOMY 100X72X124 (DRAPES) ×4 IMPLANT
DRAPE SURG 17X23 STRL (DRAPES) ×4 IMPLANT
DRAPE WARM FLUID 44X44 (DRAPES) ×4 IMPLANT
DURAPREP 26ML APPLICATOR (WOUND CARE) ×4 IMPLANT
GAUZE 4X4 16PLY ~~LOC~~+RFID DBL (SPONGE) ×4 IMPLANT
GLOVE EXAM NITRILE XL STR (GLOVE) IMPLANT
GLOVE SURG LTX SZ6.5 (GLOVE) ×4 IMPLANT
GOWN STRL REUS W/ TWL LRG LVL3 (GOWN DISPOSABLE) ×4 IMPLANT
GOWN STRL REUS W/ TWL XL LVL3 (GOWN DISPOSABLE) IMPLANT
GOWN STRL REUS W/TWL 2XL LVL3 (GOWN DISPOSABLE) IMPLANT
GOWN STRL REUS W/TWL LRG LVL3 (GOWN DISPOSABLE) ×6
GOWN STRL REUS W/TWL XL LVL3 (GOWN DISPOSABLE)
KIT BASIN OR (CUSTOM PROCEDURE TRAY) ×4 IMPLANT
KIT TURNOVER KIT B (KITS) ×4 IMPLANT
NDL HYPO 25X1 1.5 SAFETY (NEEDLE) ×2 IMPLANT
NEEDLE HYPO 25X1 1.5 SAFETY (NEEDLE) ×3 IMPLANT
NS IRRIG 1000ML POUR BTL (IV SOLUTION) ×4 IMPLANT
PACK SURGICAL SETUP 50X90 (CUSTOM PROCEDURE TRAY) ×4 IMPLANT
PAD ARMBOARD 7.5X6 YLW CONV (MISCELLANEOUS) ×12 IMPLANT
SPECIMEN JAR SMALL (MISCELLANEOUS) ×2 IMPLANT
STAPLER SKIN PROX WIDE 3.9 (STAPLE) ×4 IMPLANT
SUT VIC AB 3-0 SH 8-18 (SUTURE) ×4 IMPLANT
SYR CONTROL 10ML LL (SYRINGE) ×8 IMPLANT
TOWEL GREEN STERILE (TOWEL DISPOSABLE) ×4 IMPLANT
TOWEL GREEN STERILE FF (TOWEL DISPOSABLE) ×4 IMPLANT

## 2021-05-30 NOTE — Op Note (Signed)
05/30/2021  3:13 PM  PATIENT:  William Hunter  75 y.o. male  PRE-OPERATIVE DIAGNOSIS:  Compression fracture of T5 Vertebra  POST-OPERATIVE DIAGNOSIS:  Compression fracture of T5 Vertebra  PROCEDURE:  Procedure(s): THORACIC FIVE KYPHOPLASTY  SURGEON:  Surgeon(s): Coletta Memos, MD  ANESTHESIA:   general  EBL:  Total I/O In: 250 [IV Piggyback:250] Out: 25 [Blood:25]  BLOOD ADMINISTERED:none  COUNT:per nursing  SPECIMEN:  No Specimen  DICTATION: William Hunter was taken to the operating room, intubated and placed under a general anesthetic without difficulty. He was positioned prone on the operating room table with all pressure points properly padded. Her back was prepped and draped in a sterile manner. With fluoroscopy I localized the T5 pedicles bilaterally. I injected lidocaine into the entry sites on both the left and right sides. I started by making a stab incision on the right side and entering the right T5 pedicle with fluoroscopic guidance. Once good position was obtained, I drilled into the vertebral body. I then placed the kyphoplasty balloon into the T5 vertebra and inflated the balloon. I then inserted 3.5cc of methylmethacrylate into the vertebral body under fluoroscopic guidance. I achieved a very good fill of the cavity, staying within the confines of the vertebral body and cross fill to the left I placed a needle into the left pedicle, and in the same manner placed approximately .75cc. I removed the instrumentation from the vertebral body, and the final films looked good. I closed the stab incision with vicryl suture and used Dermabond for a sterile dressing. Marland Kitchen    PLAN OF CARE: Admit to inpatient   PATIENT DISPOSITION:  PACU - hemodynamically stable.   Delay start of Pharmacological VTE agent (>24hrs) due to surgical blood loss or risk of bleeding:  yes

## 2021-05-30 NOTE — H&P (Signed)
BP (!) 174/80    Pulse 99    Temp 98.8 F (37.1 C) (Oral)    Resp 18    Ht 5' 11.5" (1.816 m)    Wt 84.9 kg    SpO2 98%    BMI 25.75 kg/m  HISTORY OF PRESENT ILLNESS :  [name] comes in today for evaluation of pain that he has in his back, which he describes as excruciating.  [name] had two falls within 2 days.  He went to the emergency room and then was found to have a T5 compression fracture.  He had no neurological deficits which were not already present in his history.     VITAL SIGNS :  He is 5 feet 11 inches.  He weighs 187 pounds. Temperature is 99.4 degrees Fahrenheit, blood pressure is 133/81, pulse is 106. Pain is 7/10.       He was seen at the emergency room at [facility], I believe, and treated there for this fracture.  He was given pain medication, but was not discharged with any pain medication.     CURRENT MEDICATIONS :  Folic Acid and Methotrexate.     ALLERGIES :  He has an allergy to Codeine and to Penicillin.     PHYSICAL EXAMINATION :  He is alert and oriented by 4.  He answers all questions appropriately.  Memory, language, attention span, and fund of knowledge are normal. Speech is clear, it is also fluent. Hearing intact to voice.  He walks with a walker.  His gait is quite slow, not necessarily shuffling, but darn close to it.  He has normal strength at 5/5 in the lower extremities and upper extremities.  Reflexes are 2+ biceps, triceps, and brachioradialis; trace at the knees and 1+ at the ankles.  Muscle tone, bulk, and coordination were otherwise normal or appear to be.     He had a mildly positive Romberg exam.  Light touch was intact.     IMAGING :  A CT done on the day of the fall showed a T5 compression fracture, not significant loss of height, but certainly is easily seen.  The kyphosis in the thoracic spine is otherwise normal.     ASSESSMENT AND PLAN :  William Hunter has a T5 compression fracture.  He is in excruciating pain,  by his own account, and would like to proceed with a kyphoplasty.  He is 75 years of age and seemingly is in good health.  He would like to proceed with a thoracic kyphoplasty.  We will try to get this done some time next week.  I explained the procedure and why I think it would work.  I also explained the risk of bleeding, paralysis, damage to the spinal cord, or damage to the nerves.  I told him this is not very likely at all, but it certainly can occur.  He has agreed and we will get him on the schedule.

## 2021-05-30 NOTE — Anesthesia Procedure Notes (Signed)
Procedure Name: Intubation Date/Time: 05/30/2021 1:52 PM Performed by: Adria Dill, CRNA Pre-anesthesia Checklist: Patient identified, Emergency Drugs available, Suction available and Patient being monitored Patient Re-evaluated:Patient Re-evaluated prior to induction Oxygen Delivery Method: Circle system utilized Preoxygenation: Pre-oxygenation with 100% oxygen Induction Type: IV induction Ventilation: Mask ventilation without difficulty Laryngoscope Size: Miller and 3 Grade View: Grade I Tube type: Oral Tube size: 7.5 mm Number of attempts: 1 Airway Equipment and Method: Stylet Placement Confirmation: ETT inserted through vocal cords under direct vision, positive ETCO2 and breath sounds checked- equal and bilateral Secured at: 22 cm Tube secured with: Tape Dental Injury: Teeth and Oropharynx as per pre-operative assessment

## 2021-05-30 NOTE — Transfer of Care (Signed)
Immediate Anesthesia Transfer of Care Note  Patient: William Hunter  Procedure(s) Performed: THORACIC FIVE KYPHOPLASTY  Patient Location: PACU  Anesthesia Type:General  Level of Consciousness: drowsy and patient cooperative  Airway & Oxygen Therapy: Patient Spontanous Breathing and Patient connected to nasal cannula oxygen  Post-op Assessment: Report given to RN and Post -op Vital signs reviewed and stable  Post vital signs: Reviewed and stable  Last Vitals:  Vitals Value Taken Time  BP 149/93 05/30/21 1501  Temp    Pulse 68 05/30/21 1506  Resp 16 05/30/21 1506  SpO2 100 % 05/30/21 1506  Vitals shown include unvalidated device data.  Last Pain:  Vitals:   05/30/21 1026  TempSrc:   PainSc: 7       Patients Stated Pain Goal: 0 (05/30/21 1026)  Complications: No notable events documented.

## 2021-05-31 ENCOUNTER — Encounter (HOSPITAL_COMMUNITY): Payer: Self-pay | Admitting: Neurosurgery

## 2021-05-31 ENCOUNTER — Other Ambulatory Visit: Payer: Self-pay

## 2021-05-31 NOTE — Anesthesia Postprocedure Evaluation (Signed)
Anesthesia Post Note  Patient: William Hunter  Procedure(s) Performed: THORACIC FIVE KYPHOPLASTY     Patient location during evaluation: PACU Anesthesia Type: General Level of consciousness: awake and alert Pain management: pain level controlled Vital Signs Assessment: post-procedure vital signs reviewed and stable Respiratory status: spontaneous breathing, nonlabored ventilation, respiratory function stable and patient connected to nasal cannula oxygen Cardiovascular status: blood pressure returned to baseline and stable Postop Assessment: no apparent nausea or vomiting Anesthetic complications: no   No notable events documented.  Last Vitals:  Vitals:   05/30/21 1615 05/30/21 1629  BP: 126/63   Pulse: 68 68  Resp: 14 14  Temp:  36.6 C  SpO2: 96% 98%    Last Pain:  Vitals:   05/30/21 1629  TempSrc:   PainSc: 0-No pain                 Kennieth Rad

## 2021-06-18 DIAGNOSIS — S22050A Wedge compression fracture of T5-T6 vertebra, initial encounter for closed fracture: Secondary | ICD-10-CM | POA: Diagnosis not present

## 2021-06-18 DIAGNOSIS — Z6825 Body mass index (BMI) 25.0-25.9, adult: Secondary | ICD-10-CM | POA: Diagnosis not present

## 2021-06-21 ENCOUNTER — Ambulatory Visit: Payer: Medicare HMO | Admitting: Physical Therapy

## 2021-06-21 NOTE — Therapy (Addendum)
OUTPATIENT PHYSICAL THERAPY THORACOLUMBAR EVALUATION   Patient Name: William Hunter MRN: [id number] DOB:30-Nov-1945, 76 y.o., male Today's Date: 06/28/2021   PT End of Session - 06/28/21 1307     Visit Number 1    Number of Visits 16    Date for PT Re-Evaluation 08/23/21    Authorization Type [employer] MCR    PT Start Time 1217    PT Stop Time 1254    PT Time Calculation (min) 37 min             Past Medical History:  Diagnosis Date   Anginal pain ([place])    Arthritis    "fingers" (03/09/2012)   Cold feet    "from my diabetes; they stay cold" (03/09/2012)   Coronary artery disease    Coronary artery spasm [facility])    since age 11 Dr Glade Lloyd   COVID 07/06/2019   hospitalizied   Dyslipidemia    Femoral DVT (deep venous thrombosis) ([name]    left   Headache(784.0)    Hx of gallstones    Dr Dalbert Batman lap cholecystectomy   Hx of plastic surgery    plastic surgery following a fall off a loading dock ,lost  all his upper teeth    Hypertension    MI, old    at age 36   Migraines    Pneumonia ~ 1962; 1970's   "double"; "regular" (03/09/2012)   Rheumatoid arthritis ([place])    Dr Amil Amen    Slipped cervical disc 06/10/2006   Dr Gaynelle Arabian in the past/ Dr Roderic Scarce , chiropractor in 2008   Thyroid disease    Dr Dwyane Dee   Type II diabetes mellitus ([place])    Past Surgical History:  Procedure Laterality Date   CARDIAC CATHETERIZATION     x 3 with NO stents    CHOLECYSTECTOMY  03/11/2012   Procedure: LAPAROSCOPIC CHOLECYSTECTOMY WITH INTRAOPERATIVE CHOLANGIOGRAM;  Surgeon: Adin Hector, MD;  Location: [place];  Service: General;  Laterality: N/A;   KYPHOPLASTY N/A 05/30/2021   Procedure: THORACIC FIVE KYPHOPLASTY;  Surgeon: Ashok Pall, MD;  Location: [place];  Service: Neurosurgery;  Laterality: N/A;  THORACIC FIVE KYPHOPLASTY   [place]   Patient Active Problem List   Diagnosis Date Noted   Statin myopathy 02/22/2020   Acute respiratory failure with hypoxia  ([place]) 07/03/2019   Acute respiratory failure due to COVID-19 ([place]) 07/03/2019   Migraine with aura and without status migrainosus, not intractable 09/02/2016   Diabetic polyneuropathy associated with type 2 diabetes mellitus ([place]) 09/02/2016   Rheumatoid arthritis ([place]) 09/02/2016   Coronary vasospasm ([place]) 04/06/2014   Gallstones 03/10/2012   Abdominal pain 03/09/2012   MI, old    Coronary artery disease    Hypertension    Dyslipidemia     PCP: [name], DO  REFERRING PROVIDER: Ashok Pall, MD  REFERRING DIAG: Referral diagnosis: Wedge compression fracture of T5-T6 vertebra, initial encounter for closed fracture [S22.050A]  THERAPY DIAG:  Unsteadiness on feet  Other abnormalities of gait and mobility  Muscle weakness  ONSET DATE: Chronic instability in gait, kyphoplasty 12/21  SUBJECTIVE:  William Hunter is a 76 y.o. male who presents to clinic with chief complaint of lack of mobility and transfers.  He is having minimal pain after kyphoplasty on 12/21.  MOI/History of condition:  Pt with history of falls "I lose my balance and just fall forward when standing up." Denies dizziness or passing out.  This resulted in compression fracture of T5 in December and ultimately in T5 kyphoplasty on 12/21  From referring provider 12/21:   "POST-OPERATIVE DIAGNOSIS:  Compression fracture of T5 Vertebra   PROCEDURE:  Procedure(s): THORACIC FIVE KYPHOPLASTY"   Red flags:  denies   Pertinent past history:  Hx of minor HA, CAD, rheumatoid arthritis (somewhat controlled with medication), TII DM  Pain:  Are you having pain? Yes Pain location: Generalized rheumatoid arthritis pain in hands, wrists, knees NPRS scale:  highest 8/10 current 5/10  best 5/10 Aggravating factors: no  specific Relieving factors: no specific Pain description: constant Severity: high Irritability: low Stage: Chronic Stability: staying the same 24 hour pattern:    Occupation: retired  Hobbies/Recreation: none  Administrator, sports: quad cane at baseline  Hand Dominance: L  Patient Goals: transfers, balance, walk farther   PRECAUTIONS: Other: RA, FALL RISK, kyphoplasty 12/21  WEIGHT BEARING RESTRICTIONS No  FALLS:  Has patient fallen in last 6 months? Yes, Number of falls: 9  LIVING ENVIRONMENT: Lives with: lives with their spouse Stairs: No;   PLOF: Independent  DIAGNOSTIC FINDINGS (prior to kyphoplasty):  IMPRESSION: Acute superior endplate fracture at T5 with loss of height of 20%. No retropulsed bone.   OBJECTIVE:   GENERAL OBSERVATION:  Slow shuffling gait with reduced speed, decreased step height and length, walking with quad cane (but not suing)  SENSATION:  Light touch: Appears intact  LE MMT: Seated MMT grossly 3+/5 (on eval)  MMT Right 06/28/2021 Left 06/28/2021  Hip flexion (L2, L3)    Knee extension (L3)    Knee flexion    Hip abduction    Hip extension    Hip external rotation    Hip internal rotation    Hip adduction    Ankle dorsiflexion (L4)    Ankle plantarflexion (S1)    Ankle inversion    Ankle eversion    Great Toe ext (L5)     (Blank rows = not tested, score listed is out of 5 possible points.  N = WNL, D = diminished, C = clear for gross weakness with myotome testing, * = concordant pain with testing)   LE ROM:  ROM Right 06/28/2021 Left 06/28/2021  Hip flexion    Hip extension    Hip abduction    Hip adduction    Hip internal rotation    Hip external rotation    Knee flexion    Knee extension    Ankle dorsiflexion    Ankle plantarflexion    Ankle inversion    Ankle eversion      (Blank rows = not tested)   FUNCTIONAL TESTS:  Progressive balance screen:  Feet together: 10'' Semi Tandem: R in rear 5'', L in rear  unable'  10 m max gait speed: 18'', .56 m/s, AD: Y quad cane  30'' STS: 4x    UE used? Y  2 MWT: 180'  GAIT: See general observation  TODAY'S TREATMENT  See HEP  PATIENT EDUCATION:  POC, diagnosis, prognosis, HEP, and outcome measures.  Pt educated via explanation, demonstration, and handout (HEP).  Pt confirms understanding verbally.    HOME EXERCISE PROGRAM: Access  Code: [email or web address] URL: [email or web address] Date: 06/28/2021 Prepared by: Shearon Balo  Exercises Seated Hip Abduction with Resistance - 2 x daily - 7 x weekly - 3 sets - 10 reps Seated Knee Lifts with Resistance - 2 x daily - 7 x weekly - 3 sets - 10 reps Seated Hip Adduction Isometrics with Ball - 2 x daily - 7 x weekly - 1 sets - 10 reps - 10 hold Seated Long Arc Quad - 1 x daily - 7 x weekly - 3 sets - 10 reps   ASSESSMENT:  CLINICAL IMPRESSION: William Hunter is a 76 y.o. male who presents to clinic with signs and sxs consistent with history of falls which resulted in T5 compression fracture in December with resulting kyphoplasty 05/30/21.  He is having minimal pain after kyphoplasty but is in need of treating underlying reason for fall and injury.  Patient presents with pain and impairments/deficits in: balance, gait, balance, general strength.  Activity limitations include: walking, steps, bending, lifting.  Participation limitations include: housework, yardwork.  Patient will benefit from skilled therapy to address pain and the listed deficits in order to achieve functional goals, enable safety and independence in completion of daily tasks, and return to PLOF.   REHAB POTENTIAL: Fair chronic condition with complicating RA and minimal home support  CLINICAL DECISION MAKING: Stable/uncomplicated  EVALUATION COMPLEXITY: Low   GOALS:  SHORT TERM GOALS:  STG Name Target Date Goal status  1 Yuval will be >75% HEP compliant to improve carryover between sessions and facilitate independent  management of condition  Baseline: No HEP 07/19/2021 INITIAL   LONG TERM GOALS:   LTG Name Target Date Goal status  1 Abram will improve 30'' STS (MCID 2) to >/= 8x (w/ UE?: Y) to show improved LE strength and improved transfers   Baseline: 4x  w/ UE? Y 08/23/2021 INITIAL  2 Lorene will improve 2 MWT to > 240 ft to show improved endurance and activity toelrance  Baseline: 180 ft 08/23/2021 INITIAL  3 Kierre will improve 10 meter max gait speed to .8 m/s (.1 m/s MCID) to show functional improvement in ambulation   Eval: .56 m/s  Seen norms below:   08/23/2021 INITIAL  4 Chrishaun will report a >/= 60% improvement in ease of completion of ADLs  Baseline: limited 08/23/2021 INITIAL  5 Abhiraj will be able to stand for >30'' in semi-tandem stance, to show a significant improvement in balance in order to reduce fall risk   Baseline: 5'' with R in rear, unable with L 08/23/2021 INITIAL   PLAN: PT FREQUENCY: 1-2x/week  PT DURATION: 8 weeks (Ending 08/23/2021)  PLANNED INTERVENTIONS: Therapeutic exercises, Therapeutic activity, Neuro Muscular re-education, Gait training, Patient/Family education, Joint mobilization, Dry Needling, Electrical stimulation, Spinal mobilization and/or manipulation, Moist heat, Taping, Vasopneumatic device, Ionotophoresis 4mg /ml Dexamethasone, and Manual therapy  PLAN FOR NEXT SESSION: progressive standing activity tolerance and global strengthening   Shearon Balo PT, DPT 06/28/2021, 1:09 PM  Referring diagnosis? Referral diagnosis: Wedge compression fracture of T5-T6 vertebra, initial encounter for closed fracture [S22.050A] Treatment diagnosis? (if different than referring diagnosis) Unsteadiness on feet  Other abnormalities of gait and mobility  Muscle weakness What was this (referring dx) caused by? []  Surgery [x]  Fall []  Ongoing issue []  Arthritis []  Other: ____________  Laterality: []  Rt []  Lt [x]  Both  Check all possible CPT codes:  *CHOOSE  10 OR LESS*    [x]  97110 (Therapeutic Exercise)  []  92507 (SLP Treatment)  [x]  97112 (Neuro Re-ed)   []   92526 (Swallowing Treatment)   [x]  [phone] (Gait Training)   []  [id number] (Cognitive Training, 1st 15 minutes) [x]  97140 (Manual Therapy)   []  97130 (Cognitive Training, each add'l 15 minutes)  [x]  97530 (Therapeutic Activities)  []  Other, List CPT Code ____________    [x]  [id number] (Self Care)       []  All codes above (97110 - 97535)  []  97012 (Mechanical Traction)  []  97014 (E-stim Unattended)  []  97032 (E-stim manual)  []  97033 (Ionto)  []  97035 (Ultrasound)  []  97760 (Orthotic Fit) []  [id number] (Physical Performance Training) []  [id number] (Aquatic Therapy) []  97034 (Contrast Bath) []  [id number] (Paraffin) []  97597 (Wound Care 1st 20 sq cm) []  97598 (Wound Care each add'l 20 sq cm) []  97016 (Vasopneumatic Device) []  [phone] Comptroller) []  [phone] (Prosthetic Training)

## 2021-06-28 ENCOUNTER — Other Ambulatory Visit: Payer: Self-pay

## 2021-06-28 ENCOUNTER — Ambulatory Visit: Payer: Medicare HMO | Attending: Neurosurgery | Admitting: Physical Therapy

## 2021-06-28 ENCOUNTER — Encounter: Payer: Self-pay | Admitting: Physical Therapy

## 2021-06-28 DIAGNOSIS — R2689 Other abnormalities of gait and mobility: Secondary | ICD-10-CM | POA: Diagnosis not present

## 2021-06-28 DIAGNOSIS — M6281 Muscle weakness (generalized): Secondary | ICD-10-CM | POA: Diagnosis not present

## 2021-06-28 DIAGNOSIS — R2681 Unsteadiness on feet: Secondary | ICD-10-CM | POA: Diagnosis not present

## 2021-06-30 NOTE — Therapy (Incomplete)
OUTPATIENT PHYSICAL THERAPY TREATMENT NOTE   Patient Name: William Hunter MRN: [id number] DOB:1946/03/06, 76 y.o., male Today's Date: 06/30/2021  PCP: Irena Reichmann, DO REFERRING PROVIDER: Irena Reichmann, DO    Past Medical History:  Diagnosis Date   Anginal pain ([name]    Arthritis    "fingers" (03/09/2012)   Cold feet    "from my diabetes; they stay cold" (03/09/2012)   Coronary artery disease    Coronary artery spasm [facility])    since age 44 Dr Aleen Campi   COVID 07/06/2019   hospitalizied   Dyslipidemia    Femoral DVT (deep venous thrombosis) ([name]    left   Headache(784.0)    Hx of gallstones    Dr Derrell Lolling lap cholecystectomy   Hx of plastic surgery    plastic surgery following a fall off a loading dock ,lost  all his upper teeth    Hypertension    MI, old    at age 5   Migraines    Pneumonia ~ 1962; 1970's   "double"; "regular" (03/09/2012)   Rheumatoid arthritis ([name]    Dr Dierdre Forth    Slipped cervical disc 06/10/2006   Dr Patsi Sears in the past/ Dr Larita Fife , chiropractor in 2008   Thyroid disease    Dr Lucianne Muss   Type II diabetes mellitus [facility])    Past Surgical History:  Procedure Laterality Date   CARDIAC CATHETERIZATION     x 3 with NO stents    CHOLECYSTECTOMY  03/11/2012   Procedure: LAPAROSCOPIC CHOLECYSTECTOMY WITH INTRAOPERATIVE CHOLANGIOGRAM;  Surgeon: Ernestene Mention, MD;  [facility] OR;  Service: General;  Laterality: N/A;   KYPHOPLASTY N/A 05/30/2021   Procedure: THORACIC FIVE KYPHOPLASTY;  Surgeon: Coletta Memos, MD;  Location: [facility] OR;  Service: Neurosurgery;  Laterality: N/A;  THORACIC FIVE KYPHOPLASTY   PENILE PROSTHESIS IMPLANT  1983   Patient Active Problem List   Diagnosis Date Noted   Statin myopathy 02/22/2020   Acute respiratory failure with hypoxia ([name] 07/03/2019   Acute respiratory failure due to COVID-19 ([name] 07/03/2019   Migraine with aura and without status migrainosus, not intractable 09/02/2016   Diabetic polyneuropathy associated  with type 2 diabetes mellitus ([name] 09/02/2016   Rheumatoid arthritis ([name] 09/02/2016   Coronary vasospasm ([name] 04/06/2014   Gallstones 03/10/2012   Abdominal pain 03/09/2012   MI, old    Coronary artery disease    Hypertension    Dyslipidemia     REFERRING DIAG: Unsteadiness on feet  THERAPY DIAG:  No diagnosis found.  PERTINENT HISTORY: Hx of minor HA, CAD, rheumatoid arthritis (somewhat controlled with medication), TII DM, T5 kyphoplasty on 12/21  PRECAUTIONS: Other: RA, FALL RISK, kyphoplasty 12/21  SUBJECTIVE: ***  PAIN:  Are you having pain? {yes/no:20286} NPRS scale: ***/10 Pain location: *** Pain orientation: {Pain Orientation:25161}  PAIN TYPE: {type:313116} Pain description: {PAIN DESCRIPTION:21022940}  Aggravating factors: *** Relieving factors: ***     OBJECTIVE:   *Unless otherwise noted, objective information collected previously*  GENERAL OBSERVATION:           Slow shuffling gait with reduced speed, decreased step height and length, walking with quad cane (but not suing)   SENSATION:          Light touch: Appears intact   LE MMT: Seated MMT grossly 3+/5 (on eval)   MMT Right 06/28/2021 Left 06/28/2021  Hip flexion (L2, L3)      Knee extension (L3)      Knee flexion      Hip  abduction      Hip extension      Hip external rotation      Hip internal rotation      Hip adduction      Ankle dorsiflexion (L4)      Ankle plantarflexion (S1)      Ankle inversion      Ankle eversion      Great Toe ext (L5)        (Blank rows = not tested, score listed is out of 5 possible points.  N = WNL, D = diminished, C = clear for gross weakness with myotome testing, * = concordant pain with testing)     LE ROM:   ROM Right 06/28/2021 Left 06/28/2021  Hip flexion      Hip extension      Hip abduction      Hip adduction      Hip internal rotation      Hip external rotation      Knee flexion      Knee extension      Ankle dorsiflexion      Ankle  plantarflexion      Ankle inversion      Ankle eversion         (Blank rows = not tested)     FUNCTIONAL TESTS:  Progressive balance screen:   Feet together: 10'' Semi Tandem: R in rear 5'', L in rear unable'   10 m max gait speed: 18'', .56 m/s, AD: Y quad cane   30'' STS: 4x    UE used? Y   2 MWT: 180'   GAIT: See general observation   TODAY'S TREATMENT  [facility] Adult PT Treatment:                                                DATE: 07/03/2021 Therapeutic Exercise: *** Manual Therapy: *** Neuromuscular re-ed: *** Therapeutic Activity: *** Modalities: *** Self Care: ***    PATIENT EDUCATION:  POC, diagnosis, prognosis, HEP, and outcome measures.  Pt educated via explanation, demonstration, and handout (HEP).  Pt confirms understanding verbally.      HOME EXERCISE PROGRAM: Access Code: [email or web address] URL: [email or web address] Date: 06/28/2021 Prepared by: Alphonzo Severance   Exercises Seated Hip Abduction with Resistance - 2 x daily - 7 x weekly - 3 sets - 10 reps Seated Knee Lifts with Resistance - 2 x daily - 7 x weekly - 3 sets - 10 reps Seated Hip Adduction Isometrics with Ball - 2 x daily - 7 x weekly - 1 sets - 10 reps - 10 hold Seated Long Arc Quad - 1 x daily - 7 x weekly - 3 sets - 10 reps     ASSESSMENT:   CLINICAL IMPRESSION: ***     REHAB POTENTIAL: Fair chronic condition with complicating RA and minimal home support   CLINICAL DECISION MAKING: Stable/uncomplicated   EVALUATION COMPLEXITY: Low     GOALS:   SHORT TERM GOALS:   STG Name Target Date Goal status  1 William Hunter will be >75% HEP compliant to improve carryover between sessions and facilitate independent management of condition   Baseline: No HEP 07/19/2021 INITIAL    LONG TERM GOALS:    LTG Name Target Date Goal status  1 William Hunter will improve 30'' STS (MCID 2) to >/= 8x (w/ UE?: Y) to show improved LE  strength and improved transfers    Baseline: 4x  w/ UE? Y  08/23/2021 INITIAL  2 William Hunter will improve 2 MWT to > 240 ft to show improved endurance and activity toelrance   Baseline: 180 ft 08/23/2021 INITIAL  3 William Hunter will improve 10 meter max gait speed to .8 m/s (.1 m/s MCID) to show functional improvement in ambulation    Eval: .56 m/s   Seen norms below:    08/23/2021 INITIAL  4 William Hunter will report a >/= 60% improvement in ease of completion of ADLs   Baseline: limited 08/23/2021 INITIAL  5 William Hunter will be able to stand for >30'' in semi-tandem stance, to show a significant improvement in balance in order to reduce fall risk    Baseline: 5'' with R in rear, unable with L 08/23/2021 INITIAL    PLAN: PT FREQUENCY: 1-2x/week   PT DURATION: 8 weeks (Ending 08/23/2021)   PLANNED INTERVENTIONS: Therapeutic exercises, Therapeutic activity, Neuro Muscular re-education, Gait training, Patient/Family education, Joint mobilization, Dry Needling, Electrical stimulation, Spinal mobilization and/or manipulation, Moist heat, Taping, Vasopneumatic device, Ionotophoresis 4mg /ml Dexamethasone, and Manual therapy   PLAN FOR NEXT SESSION: progressive standing activity tolerance and global strengthening    Carmelina DaneYarborough, Maisee Vollman, PT, DPT 06/30/21 10:16 AM

## 2021-07-03 ENCOUNTER — Ambulatory Visit: Payer: Medicare HMO

## 2021-07-05 ENCOUNTER — Ambulatory Visit: Payer: Medicare HMO

## 2021-07-09 DIAGNOSIS — M0609 Rheumatoid arthritis without rheumatoid factor, multiple sites: Secondary | ICD-10-CM | POA: Diagnosis not present

## 2021-07-09 DIAGNOSIS — Z79899 Other long term (current) drug therapy: Secondary | ICD-10-CM | POA: Diagnosis not present

## 2021-07-09 DIAGNOSIS — M170 Bilateral primary osteoarthritis of knee: Secondary | ICD-10-CM | POA: Diagnosis not present

## 2021-07-10 ENCOUNTER — Ambulatory Visit: Payer: Medicare HMO

## 2021-07-10 NOTE — Therapy (Incomplete)
OUTPATIENT PHYSICAL THERAPY TREATMENT NOTE   Patient Name: William Hunter MRN: [id number] DOB:1945-07-11, 76 y.o., male Today's Date: 07/10/2021  PCP: Irena Reichmann, DO REFERRING PROVIDER: Irena Reichmann, DO    Past Medical History:  Diagnosis Date   Anginal pain ([name]    Arthritis    "fingers" (03/09/2012)   Cold feet    "from my diabetes; they stay cold" (03/09/2012)   Coronary artery disease    Coronary artery spasm [facility])    since age 77 Dr Aleen Campi   COVID 07/06/2019   hospitalizied   Dyslipidemia    Femoral DVT (deep venous thrombosis) ([name]    left   Headache(784.0)    Hx of gallstones    Dr Derrell Lolling lap cholecystectomy   Hx of plastic surgery    plastic surgery following a fall off a loading dock ,lost  all his upper teeth    Hypertension    MI, old    at age 57   Migraines    Pneumonia ~ 1962; 1970's   "double"; "regular" (03/09/2012)   Rheumatoid arthritis ([name]    Dr Dierdre Forth    Slipped cervical disc 06/10/2006   Dr Patsi Sears in the past/ Dr Larita Fife , chiropractor in 2008   Thyroid disease    Dr Lucianne Muss   Type II diabetes mellitus [facility])    Past Surgical History:  Procedure Laterality Date   CARDIAC CATHETERIZATION     x 3 with NO stents    CHOLECYSTECTOMY  03/11/2012   Procedure: LAPAROSCOPIC CHOLECYSTECTOMY WITH INTRAOPERATIVE CHOLANGIOGRAM;  Surgeon: Ernestene Mention, MD;  Location: [facility] OR;  Service: General;  Laterality: N/A;   KYPHOPLASTY N/A 05/30/2021   Procedure: THORACIC FIVE KYPHOPLASTY;  Surgeon: Coletta Memos, MD;  Location: [facility] OR;  Service: Neurosurgery;  Laterality: N/A;  THORACIC FIVE KYPHOPLASTY   PENILE PROSTHESIS IMPLANT  1983   Patient Active Problem List   Diagnosis Date Noted   Statin myopathy 02/22/2020   Acute respiratory failure with hypoxia ([name] 07/03/2019   Acute respiratory failure due to COVID-19 ([name] 07/03/2019   Migraine with aura and without status migrainosus, not intractable 09/02/2016   Diabetic polyneuropathy associated  with type 2 diabetes mellitus ([name] 09/02/2016   Rheumatoid arthritis ([name] 09/02/2016   Coronary vasospasm ([name] 04/06/2014   Gallstones 03/10/2012   Abdominal pain 03/09/2012   MI, old    Coronary artery disease    Hypertension    Dyslipidemia     REFERRING DIAG:  Referral diagnosis: Wedge compression fracture of T5-T6 vertebra, initial encounter for closed fracture [S22.050A  THERAPY DIAG:  No diagnosis found.  PERTINENT HISTORY:  Hx of minor HA, CAD, rheumatoid arthritis (somewhat controlled with medication), TII DM  PRECAUTIONS:  Other: RA, FALL RISK, kyphoplasty 12/21  SUBJECTIVE: ***  Pain: Are you having pain? {yes/no:20286} NPRS: ***/10 Pain Location: *** Pain Frequency/Description: {PAIN DESCRIPTION:21022940}  Aggravating Factors: *** Relieving Factors: ***  OBJECTIVE:    GENERAL OBSERVATION:           Slow shuffling gait with reduced speed, decreased step height and length, walking with quad cane (but not suing)   SENSATION:          Light touch: Appears intact   LE MMT: Seated MMT grossly 3+/5 (on eval)   MMT Right 06/28/2021 Left 06/28/2021  Hip flexion (L2, L3)      Knee extension (L3)      Knee flexion      Hip abduction      Hip extension  Hip external rotation      Hip internal rotation      Hip adduction      Ankle dorsiflexion (L4)      Ankle plantarflexion (S1)      Ankle inversion      Ankle eversion      Great Toe ext (L5)        (Blank rows = not tested, score listed is out of 5 possible points.  N = WNL, D = diminished, C = clear for gross weakness with myotome testing, * = concordant pain with testing)     LE ROM:   ROM Right 06/28/2021 Left 06/28/2021  Hip flexion      Hip extension      Hip abduction      Hip adduction      Hip internal rotation      Hip external rotation      Knee flexion      Knee extension      Ankle dorsiflexion      Ankle plantarflexion      Ankle inversion      Ankle eversion          (Blank rows = not tested)     FUNCTIONAL TESTS:  Progressive balance screen:   Feet together: 10'' Semi Tandem: R in rear 5'', L in rear unable'   10 m max gait speed: 18'', .56 m/s, AD: Y quad cane   30'' STS: 4x    UE used? Y   2 MWT: 180'   GAIT: See general observation   TODAY'S TREATMENT  See HEP   PATIENT EDUCATION:  POC, diagnosis, prognosis, HEP, and outcome measures.  Pt educated via explanation, demonstration, and handout (HEP).  Pt confirms understanding verbally.      HOME EXERCISE PROGRAM: Access Code: [email or web address] URL: [email or web address] Date: 06/28/2021 Prepared by: Shearon Balo   Exercises Seated Hip Abduction with Resistance - 2 x daily - 7 x weekly - 3 sets - 10 reps Seated Knee Lifts with Resistance - 2 x daily - 7 x weekly - 3 sets - 10 reps Seated Hip Adduction Isometrics with Ball - 2 x daily - 7 x weekly - 1 sets - 10 reps - 10 hold Seated Long Arc Quad - 1 x daily - 7 x weekly - 3 sets - 10 reps     ASSESSMENT:   CLINICAL IMPRESSION: ***     REHAB POTENTIAL: Fair chronic condition with complicating RA and minimal home support   CLINICAL DECISION MAKING: Stable/uncomplicated   EVALUATION COMPLEXITY: Low     GOALS:   SHORT TERM GOALS:   STG Name Target Date Goal status  1 Terryl will be >75% HEP compliant to improve carryover between sessions and facilitate independent management of condition   Baseline: No HEP 07/19/2021 INITIAL    LONG TERM GOALS:    LTG Name Target Date Goal status  1 Yasmin will improve 30'' STS (MCID 2) to >/= 8x (w/ UE?: Y) to show improved LE strength and improved transfers    Baseline: 4x  w/ UE? Y 08/23/2021 INITIAL  2 Aalim will improve 2 MWT to > 240 ft to show improved endurance and activity toelrance   Baseline: 180 ft 08/23/2021 INITIAL  3 Hassaan will improve 10 meter max gait speed to .8 m/s (.1 m/s MCID) to show functional improvement in ambulation    Eval: .56 m/s   Seen  norms below:    08/23/2021 INITIAL  4 Iona Beard  will report a >/= 60% improvement in ease of completion of ADLs   Baseline: limited 08/23/2021 INITIAL  5 Satvik will be able to stand for >30'' in semi-tandem stance, to show a significant improvement in balance in order to reduce fall risk    Baseline: 5'' with R in rear, unable with L 08/23/2021 INITIAL    PLAN: PT FREQUENCY: 1-2x/week   PT DURATION: 8 weeks (Ending 08/23/2021)   PLANNED INTERVENTIONS: Therapeutic exercises, Therapeutic activity, Neuro Muscular re-education, Gait training, Patient/Family education, Joint mobilization, Dry Needling, Electrical stimulation, Spinal mobilization and/or manipulation, Moist heat, Taping, Vasopneumatic device, Ionotophoresis 4mg /ml Dexamethasone, and Manual therapy   PLAN FOR NEXT SESSION: progressive standing activity tolerance and global strengthening    Ward Chatters, PT 07/10/2021, 11:36 AM

## 2021-07-11 ENCOUNTER — Telehealth: Payer: Self-pay

## 2021-07-11 NOTE — Telephone Encounter (Signed)
PT called and spoke to spouse Talbert Forest) regarding missed visit on 07/10/2021. Reminded her of next appointment, which they confirmed.   Eloy End, PT 07/11/21 1:54 PM

## 2021-07-12 ENCOUNTER — Ambulatory Visit: Payer: Medicare HMO | Attending: Neurosurgery | Admitting: Physical Therapy

## 2021-07-12 ENCOUNTER — Encounter: Payer: Self-pay | Admitting: Physical Therapy

## 2021-07-12 ENCOUNTER — Other Ambulatory Visit: Payer: Self-pay

## 2021-07-12 DIAGNOSIS — M6281 Muscle weakness (generalized): Secondary | ICD-10-CM | POA: Insufficient documentation

## 2021-07-12 DIAGNOSIS — R2689 Other abnormalities of gait and mobility: Secondary | ICD-10-CM | POA: Diagnosis not present

## 2021-07-12 DIAGNOSIS — R2681 Unsteadiness on feet: Secondary | ICD-10-CM | POA: Diagnosis not present

## 2021-07-12 NOTE — Therapy (Signed)
OUTPATIENT PHYSICAL THERAPY TREATMENT NOTE   Patient Name: William Hunter MRN: [id number] DOB:09/04/1945, 76 y.o., male Today's Date: 07/12/2021  PCP: William Morning, DO REFERRING PROVIDER: Ashok Pall, MD   PT End of Session - 07/12/21 1230     Visit Number 2    Number of Visits 16    Date for PT Re-Evaluation 08/23/21    Authorization Type [employer] MCR    PT Start Time 3   pt arrived late   PT Stop Time 1258    PT Time Calculation (min) 28 min    Equipment Utilized During Treatment Gait belt             Past Medical History:  Diagnosis Date   Anginal pain ([place])    Arthritis    "fingers" (03/09/2012)   Cold feet    "from my diabetes; they stay cold" (03/09/2012)   Coronary artery disease    Coronary [facility] sp[facility]    since age 53 Dr William Hunter   COVID 07/06/2019   hospitalizied   Dyslipidemia    Femoral DVT (deep venous thrombosis) ([name]    left   Headache(784.0)    Hx of gallstones    Dr William Hunter lap cholecystectomy   Hx of plastic surgery    plastic surgery following a fall off a loading dock ,lost  all his upper teeth    Hypertension    MI, old    at age 44   Migraines    Pneumonia ~ 1962; 1970's   "double"; "regular" (03/09/2012)   Rheumatoid arthritis ([place])    Dr William Hunter    Slipped cervical disc 06/10/2006   Dr William Hunter in the past/ Dr William Hunter , chiropractor in 2008   Thyroid disease    Dr William Hunter   Type II diabetes mellitus [facility])    Past Surgical History:  Procedure Laterality Date   CARDIAC CATHETERIZATION     x 3 with NO stents    CHOLECYSTECTOMY  03/11/2012   Procedure: LAPAROSCOPIC CHOLECYSTECTOMY WITH INTRAOPERATIVE CHOLANGIOGRAM;  Surgeon: William Hector, MD;  Location: [place];  Service: General;  Laterality: N/A;   KYPHOPLASTY N/A 05/30/2021   Procedure: THORACIC FIVE KYPHOPLASTY;  Surgeon: William Pall, MD;  Location: [place];  Service: Neurosurgery;  Laterality: N/A;  THORACIC FIVE KYPHOPLASTY   William Hunter    Patient Active Problem List   Diagnosis Date Noted   Statin myopathy 02/22/2020   Acute respiratory failure with hypoxia ([place]) 07/03/2019   Acute respiratory failure due to COVID-19 ([place]) 07/03/2019   Migraine with aura and without status migrainosus, not intractable 09/02/2016   Diabetic polyneuropathy associated with type 2 diabetes mellitus ([place]) 09/02/2016   Rheumatoid arthritis ([place]) 09/02/2016   Coronary vasospasm ([place]) 04/06/2014   Gallstones 03/10/2012   Abdominal pain 03/09/2012   MI, old    Coronary artery disease    Hypertension    Dyslipidemia     REFERRING DIAG: Referral diagnosis: Wedge compression fracture of T5-T6 vertebra, initial encounter for closed fracture [S22.050A]  THERAPY DIAG:  Unsteadiness on feet  Other abnormalities of gait and mobility  Muscle weakness  PERTINENT HISTORY: Hx of minor HA, CAD, rheumatoid arthritis (somewhat controlled with medication), TII DM  PRECAUTIONS/RESTRICTIONS:   PRECAUTIONS: Other: RA, FALL RISK, kyphoplasty 12/21   WEIGHT BEARING RESTRICTIONS No  SUBJECTIVE:  Pt reports that he is doing OK today.  He went to [facility] and fell in the parking lon on Friday with no reported injury.  He let  his MD know about this Monday.  He is not sure why he fell and does not remember clearly.  Pain:  Are you having pain? Yes Pain location: Generalized rheumatoid arthritis pain in hands, wrists, knees NPRS scale:  highest 8/10 current 5/10  best 5/10 Aggravating factors: no specific Relieving factors: no specific Pain description: constant Severity: high Irritability: low Stage: Chronic Stability: staying the same 24 hour pattern:    OBJECTIVE:  LE MMT: Seated MMT grossly 3+/5 (on eval)   MMT Right 06/28/2021 Left 06/28/2021  Hip flexion (L2, L3)      Knee extension (L3)      Knee flexion      Hip abduction      Hip extension      Hip external rotation      Hip internal rotation      Hip adduction      Ankle  dorsiflexion (L4)      Ankle plantarflexion (S1)      Ankle inversion      Ankle eversion      Great Toe ext (L5)        (Blank rows = not tested, score listed is out of 5 possible points.  N = WNL, D = diminished, C = clear for gross weakness with myotome testing, * = concordant pain with testing)     LE ROM:   ROM Right 06/28/2021 Left 06/28/2021  Hip flexion      Hip extension      Hip abduction      Hip adduction      Hip internal rotation      Hip external rotation      Knee flexion      Knee extension      Ankle dorsiflexion      Ankle plantarflexion      Ankle inversion      Ankle eversion         (Blank rows = not tested)     FUNCTIONAL TESTS:  Progressive balance screen:   Feet together: 10'' Semi Tandem: R in rear 5'', L in rear unable'   10 m max gait speed: 18'', .56 m/s, AD: Y quad cane   30'' STS: 4x    UE used? Y   2 MWT: 180'   TREATMENT 07/12/2021:  Therapeutic Exercise: - nu-step L5 37m while taking subjective and planning session with patient - in //  - heel raises 20x  - hip abd 20x  - march 20x  - hurdle walking fwd and lat - 4 laps ea   Neuromuscular re-ed: - NA  HOME EXERCISE PROGRAM: Access Code: [email or web address] URL: [email or web address] Date: 06/28/2021 Prepared by: William Hunter   Exercises Seated Hip Abduction with Resistance - 2 x daily - 7 x weekly - 3 sets - 10 reps Seated Knee Lifts with Resistance - 2 x daily - 7 x weekly - 3 sets - 10 reps Seated Hip Adduction Isometrics with Ball - 2 x daily - 7 x weekly - 1 sets - 10 reps - 10 hold Seated Long Arc Quad - 1 x daily - 7 x weekly - 3 sets - 10 reps     ASSESSMENT:   CLINICAL IMPRESSION: William Hunter is progressing fair with therapy.  Pt reports no increase in baseline pain following therapy.  Today we concentrated on lower extremity strengthening and increasing activity tolerance.  Pt with high levels of fatigue, but no increase in pain.  Needs cuing to clear  hurdles and  struggles with this when fatigued.  Pt will continue to benefit from skilled physical therapy to address remaining deficits and achieve listed goals.  Continue per POC.    GOALS:   SHORT TERM GOALS:   STG Name Target Date Goal status  1 Verlyn will be >75% HEP compliant to improve carryover between sessions and facilitate independent management of condition   Baseline: No HEP  2/2: somewhat compliant 07/19/2021 Ongoing    LONG TERM GOALS:    LTG Name Target Date Goal status  1 Caley will improve 30'' STS (MCID 2) to >/= 8x (w/ UE?: Y) to show improved LE strength and improved transfers    Baseline: 4x  w/ UE? Y 08/23/2021 INITIAL  2 Senan will improve 2 MWT to > 240 ft to show improved endurance and activity toelrance   Baseline: 180 ft 08/23/2021 INITIAL  3 Artist will improve 10 meter max gait speed to .8 m/s (.1 m/s MCID) to show functional improvement in ambulation    Eval: .56 m/s   Seen norms below:    08/23/2021 INITIAL  4 Julien will report a >/= 60% improvement in ease of completion of ADLs   Baseline: limited 08/23/2021 INITIAL  5 Jaysun will be able to stand for >30'' in semi-tandem stance, to show a significant improvement in balance in order to reduce fall risk    Baseline: 5'' with R in rear, unable with L 08/23/2021 INITIAL    PLAN: PT FREQUENCY: 1-2x/week   PT DURATION: 8 weeks (Ending 08/23/2021)   PLANNED INTERVENTIONS: Therapeutic exercises, Therapeutic activity, Neuro Muscular re-education, Gait training, Patient/Family education, Joint mobilization, Dry Needling, Electrical stimulation, Spinal mobilization and/or manipulation, Moist heat, Taping, Vasopneumatic device, Ionotophoresis 4mg /ml Dexamethasone, and Manual therapy   PLAN FOR NEXT SESSION: progressive standing activity tolerance and global strengthening    Kevan Ny Kentrell Hallahan PT 07/12/2021, 1:00 PM

## 2021-07-17 ENCOUNTER — Encounter: Payer: Self-pay | Admitting: Physical Therapy

## 2021-07-17 ENCOUNTER — Other Ambulatory Visit: Payer: Self-pay

## 2021-07-17 ENCOUNTER — Ambulatory Visit: Payer: Medicare HMO | Admitting: Physical Therapy

## 2021-07-17 VITALS — BP 137/87 | HR 86

## 2021-07-17 DIAGNOSIS — M6281 Muscle weakness (generalized): Secondary | ICD-10-CM

## 2021-07-17 DIAGNOSIS — R2689 Other abnormalities of gait and mobility: Secondary | ICD-10-CM | POA: Diagnosis not present

## 2021-07-17 DIAGNOSIS — R2681 Unsteadiness on feet: Secondary | ICD-10-CM | POA: Diagnosis not present

## 2021-07-17 NOTE — Therapy (Signed)
OUTPATIENT PHYSICAL THERAPY TREATMENT NOTE   Patient Name: William Hunter MRN: [id number] DOB:10/05/1945, 76 y.o., male Today's Date: 07/17/2021  PCP: William Morning, DO REFERRING PROVIDER: [name], DO   PT End of Session - 07/17/21 1312     Visit Number 3    Number of Visits 16    Date for PT Re-Evaluation 08/23/21    Authorization Type [employer] MCR    PT Start Time 1312   pt arrives late   PT Stop Time 1343    PT Time Calculation (min) 31 min    Equipment Utilized During Treatment Gait belt             Past Medical History:  Diagnosis Date   Anginal pain ([place])    Arthritis    "fingers" (03/09/2012)   Cold feet    "from my diabetes; they stay cold" (03/09/2012)   Coronary artery disease    Coronary artery spasm [facility])    since age 85 Dr William Hunter   COVID 07/06/2019   hospitalizied   Dyslipidemia    Femoral DVT (deep venous thrombosis) ([name]    left   Headache(784.0)    Hx of gallstones    Dr William Hunter lap cholecystectomy   Hx of plastic surgery    plastic surgery following a fall off a loading dock ,lost  all his upper teeth    Hypertension    MI, old    at age 58   Migraines    Pneumonia ~ 1962; 1970's   "double"; "regular" (03/09/2012)   Rheumatoid arthritis ([place])    Dr William Hunter    Slipped cervical disc 06/10/2006   Dr William Hunter in the past/ Dr William Hunter , chiropractor in 2008   Thyroid disease    Dr William Hunter   Type II diabetes mellitus [facility])    Past Surgical History:  Procedure Laterality Date   CARDIAC CATHETERIZATION     x 3 with NO stents    CHOLECYSTECTOMY  03/11/2012   Procedure: LAPAROSCOPIC CHOLECYSTECTOMY WITH INTRAOPERATIVE CHOLANGIOGRAM;  Surgeon: William Hector, MD;  Location: [place];  Service: General;  Laterality: N/A;   KYPHOPLASTY N/A 05/30/2021   Procedure: THORACIC FIVE KYPHOPLASTY;  Surgeon: William Pall, MD;  Location: [place];  Service: Neurosurgery;  Laterality: N/A;  THORACIC FIVE KYPHOPLASTY   William Hunter    Patient Active Problem List   Diagnosis Date Noted   Statin myopathy 02/22/2020   Acute respiratory failure with hypoxia ([place]) 07/03/2019   Acute respiratory failure due to COVID-19 ([place]) 07/03/2019   Migraine with aura and without status migrainosus, not intractable 09/02/2016   Diabetic polyneuropathy associated with type 2 diabetes mellitus ([place]) 09/02/2016   Rheumatoid arthritis ([place]) 09/02/2016   Coronary vasospasm ([place]) 04/06/2014   Gallstones 03/10/2012   Abdominal pain 03/09/2012   MI, old    Coronary artery disease    Hypertension    Dyslipidemia     REFERRING DIAG: Referral diagnosis: Wedge compression fracture of T5-T6 vertebra, initial encounter for closed fracture [S22.050A]  THERAPY DIAG:  Unsteadiness on feet  Other abnormalities of gait and mobility  Muscle weakness  PERTINENT HISTORY: Hx of minor HA, CAD, rheumatoid arthritis (somewhat controlled with medication), TII DM  PRECAUTIONS/RESTRICTIONS:   PRECAUTIONS: Other: RA, FALL RISK, kyphoplasty 12/21   WEIGHT BEARING RESTRICTIONS No  SUBJECTIVE:  Pt reports that he is feeling dizzy today.  He had a fall over the weekend while getting in bed with no injury.  He plans on calling  his MD.  Pain:  Are you having pain? Yes Pain location: Generalized rheumatoid arthritis pain in hands, wrists, knees NPRS scale: current 6/10  Aggravating factors: no specific Relieving factors: no specific Pain description: constant Severity: high Irritability: low Stage: Chronic Stability: staying the same 24 hour pattern:    OBJECTIVE:  Today's Vitals   07/17/21 1321  BP: 137/87  Pulse: 86  SpO2: 98%   There is no height or weight on file to calculate BMI.   LE MMT: Seated MMT grossly 3+/5 (on eval)   MMT Right 06/28/2021 Left 06/28/2021  Hip flexion (L2, L3)      Knee extension (L3)      Knee flexion      Hip abduction      Hip extension      Hip external rotation      Hip internal rotation       Hip adduction      Ankle dorsiflexion (L4)      Ankle plantarflexion (S1)      Ankle inversion      Ankle eversion      Great Toe ext (L5)        (Blank rows = not tested, score listed is out of 5 possible points.  N = WNL, D = diminished, C = clear for gross weakness with myotome testing, * = concordant pain with testing)     LE ROM:   ROM Right 06/28/2021 Left 06/28/2021  Hip flexion      Hip extension      Hip abduction      Hip adduction      Hip internal rotation      Hip external rotation      Knee flexion      Knee extension      Ankle dorsiflexion      Ankle plantarflexion      Ankle inversion      Ankle eversion         (Blank rows = not tested)     FUNCTIONAL TESTS:  Progressive balance screen:   Feet together: 10'' Semi Tandem: R in rear 5'', L in rear unable'   10 m max gait speed: 18'', .56 m/s, AD: Y quad cane   30'' STS: 4x    UE used? Y   2 MWT: 180'   TREATMENT 07/17/2021:  Therapeutic Exercise: - nu-step L5 106m while taking subjective and planning session with patient (LE only) - in //  - heel raises 20x  - hip abd 20x (NT)  - march 20x (NT)  - hurdle walking fwd and lat - 5 laps ea  - rocker board DF/PF - 20x   Neuromuscular re-ed: - NA  HOME EXERCISE PROGRAM: Access Code: [email or web address] URL: [email or web address] Date: 06/28/2021 Prepared by: William Hunter   Exercises Seated Hip Abduction with Resistance - 2 x daily - 7 x weekly - 3 sets - 10 reps Seated Knee Lifts with Resistance - 2 x daily - 7 x weekly - 3 sets - 10 reps Seated Hip Adduction Isometrics with Ball - 2 x daily - 7 x weekly - 1 sets - 10 reps - 10 hold Seated Long Arc Quad - 1 x daily - 7 x weekly - 3 sets - 10 reps     ASSESSMENT:   CLINICAL IMPRESSION: William Hunter is progressing fair with therapy.  Pt reports no increase in baseline pain following therapy.  Today we concentrated on lower extremity strengthening, balance/proprioception, and increasing  activity  tolerance.  Pt with high level of fatigue with standing exercises.  His vitals were WNL and he reports no lightheadedness; I encouraged him to call his PCP about the dizziness - he agrees to plan.  Pt will continue to benefit from skilled physical therapy to address remaining deficits and achieve listed goals.  Continue per POC.   GOALS:   SHORT TERM GOALS:   STG Name Target Date Goal status  1 Kassim will be >75% HEP compliant to improve carryover between sessions and facilitate independent management of condition   Baseline: No HEP  2/2: somewhat compliant 07/19/2021 Ongoing    LONG TERM GOALS:    LTG Name Target Date Goal status  1 Mahamed will improve 30'' STS (MCID 2) to >/= 8x (w/ UE?: Y) to show improved LE strength and improved transfers    Baseline: 4x  w/ UE? Y 08/23/2021 INITIAL  2 Jes will improve 2 MWT to > 240 ft to show improved endurance and activity toelrance   Baseline: 180 ft 08/23/2021 INITIAL  3 Fowler will improve 10 meter max gait speed to .8 m/s (.1 m/s MCID) to show functional improvement in ambulation    Eval: .56 m/s   Seen norms below:    08/23/2021 INITIAL  4 Mae will report a >/= 60% improvement in ease of completion of ADLs   Baseline: limited 08/23/2021 INITIAL  5 Grafton will be able to stand for >30'' in semi-tandem stance, to show a significant improvement in balance in order to reduce fall risk    Baseline: 5'' with R in rear, unable with L 08/23/2021 INITIAL    PLAN: PT FREQUENCY: 1-2x/week   PT DURATION: 8 weeks (Ending 08/23/2021)   PLANNED INTERVENTIONS: Therapeutic exercises, Therapeutic activity, Neuro Muscular re-education, Gait training, Patient/Family education, Joint mobilization, Dry Needling, Electrical stimulation, Spinal mobilization and/or manipulation, Moist heat, Taping, Vasopneumatic device, Ionotophoresis 4mg /ml Dexamethasone, and Manual therapy   PLAN FOR NEXT SESSION: progressive standing activity tolerance and  global strengthening    Kevan Ny Jyasia Markoff PT 07/17/2021, 1:24 PM

## 2021-07-19 ENCOUNTER — Encounter: Payer: Self-pay | Admitting: Physical Therapy

## 2021-07-19 ENCOUNTER — Ambulatory Visit: Payer: Medicare HMO | Admitting: Physical Therapy

## 2021-07-19 ENCOUNTER — Other Ambulatory Visit: Payer: Self-pay

## 2021-07-19 DIAGNOSIS — M6281 Muscle weakness (generalized): Secondary | ICD-10-CM | POA: Diagnosis not present

## 2021-07-19 DIAGNOSIS — R2689 Other abnormalities of gait and mobility: Secondary | ICD-10-CM | POA: Diagnosis not present

## 2021-07-19 DIAGNOSIS — R2681 Unsteadiness on feet: Secondary | ICD-10-CM | POA: Diagnosis not present

## 2021-07-19 NOTE — Therapy (Signed)
OUTPATIENT PHYSICAL THERAPY TREATMENT NOTE   Patient Name: William Hunter MRN: [id number] DOB:[birth year], 76 y.o., male Today's Date: 07/19/2021  PCP: William Morning, DO REFERRING PROVIDER: Ashok Pall, MD   PT End of Session - 07/19/21 1136     Visit Number 4    Number of Visits 16    Date for PT Re-Evaluation 08/23/21    Authorization Type [employer] MCR    PT Start Time 1135    PT Stop Time 1215    PT Time Calculation (min) 40 min    Equipment Utilized During Treatment Gait belt             Past Medical History:  Diagnosis Date   Anginal pain ([place])    Arthritis    "fingers" (03/09/2012)   Cold feet    "from my diabetes; they stay cold" (03/09/2012)   Coronary artery disease    Coronary artery spasm [facility])    since age 84 Dr William Hunter   COVID 07/06/2019   hospitalizied   Dyslipidemia    Femoral DVT (deep venous thrombosis) ([name]    left   Headache(784.0)    Hx of gallstones    Dr William Hunter lap cholecystectomy   Hx of plastic surgery    plastic surgery following a fall off a loading dock ,lost  all his upper teeth    Hypertension    MI, old    at age 41   Migraines    Pneumonia ~ 1962; 1970's   "double"; "regular" (03/09/2012)   Rheumatoid arthritis ([place])    Dr William Hunter    Slipped cervical disc 06/10/2006   Dr William Hunter in the past/ Dr William Hunter , chiropractor in 2008   Thyroid disease    Dr William Hunter   Type II diabetes mellitus [facility])    Past Surgical History:  Procedure Laterality Date   CARDIAC CATHETERIZATION     x 3 with NO stents    CHOLECYSTECTOMY  03/11/2012   Procedure: LAPAROSCOPIC CHOLECYSTECTOMY WITH INTRAOPERATIVE CHOLANGIOGRAM;  Surgeon: William Hector, MD;  Location: [place];  Service: General;  Laterality: N/A;   KYPHOPLASTY N/A 05/30/2021   Procedure: THORACIC FIVE KYPHOPLASTY;  Surgeon: William Pall, MD;  Location: [place];  Service: Neurosurgery;  Laterality: N/A;  THORACIC FIVE KYPHOPLASTY   [place]   Patient Active Problem  List   Diagnosis Date Noted   Statin myopathy 02/22/2020   Acute respiratory failure with hypoxia ([place]) 07/03/2019   Acute respiratory failure due to COVID-19 ([place]) 07/03/2019   Migraine with aura and without status migrainosus, not intractable 09/02/2016   Diabetic polyneuropathy associated with type 2 diabetes mellitus ([place]) 09/02/2016   Rheumatoid arthritis ([place]) 09/02/2016   Coronary vasospasm ([place]) 04/06/2014   Gallstones 03/10/2012   Abdominal pain 03/09/2012   MI, old    Coronary artery disease    Hypertension    Dyslipidemia     REFERRING DIAG: Referral diagnosis: Wedge compression fracture of T5-T6 vertebra, initial encounter for closed fracture [S22.050A]  THERAPY DIAG:  Unsteadiness on feet  Other abnormalities of gait and mobility  Muscle weakness  PERTINENT HISTORY: Hx of minor HA, CAD, rheumatoid arthritis (somewhat controlled with medication), TII DM  PRECAUTIONS/RESTRICTIONS:   PRECAUTIONS: Other: RA, FALL RISK, kyphoplasty 12/21   WEIGHT BEARING RESTRICTIONS No  SUBJECTIVE:  Pt reports that he feel the exercise has helped his pain.  Pain:  Are you having pain? Yes Pain location: Generalized rheumatoid arthritis pain in hands, wrists, knees NPRS scale: current  3/10  Aggravating factors: no specific Relieving factors: no specific Pain description: constant Severity: high Irritability: low Stage: Chronic Stability: staying the same 24 hour pattern:    OBJECTIVE:  There were no vitals filed for this visit.  There is no height or weight on file to calculate BMI.   LE MMT: Seated MMT grossly 3+/5 (on eval)   MMT Right 06/28/2021 Left 06/28/2021  Hip flexion (L2, L3)      Knee extension (L3)      Knee flexion      Hip abduction      Hip extension      Hip external rotation      Hip internal rotation      Hip adduction      Ankle dorsiflexion (L4)      Ankle plantarflexion (S1)      Ankle inversion      Ankle eversion      Great Toe ext  (L5)        (Blank rows = not tested, score listed is out of 5 possible points.  N = WNL, D = diminished, C = clear for gross weakness with myotome testing, * = concordant pain with testing)     LE ROM:   ROM Right 06/28/2021 Left 06/28/2021  Hip flexion      Hip extension      Hip abduction      Hip adduction      Hip internal rotation      Hip external rotation      Knee flexion      Knee extension      Ankle dorsiflexion      Ankle plantarflexion      Ankle inversion      Ankle eversion         (Blank rows = not tested)     FUNCTIONAL TESTS:  Progressive balance screen:   Feet together: 10'' Semi Tandem: R in rear 5'', L in rear unable'   10 m max gait speed: 18'', .56 m/s, AD: Y quad cane   30'' STS: 4x    UE used? Y   2 MWT: 180'   TREATMENT 07/19/2021:  Therapeutic Exercise: - nu-step L5 70m while taking subjective and planning session with patient (LE only) - in //  - heel raises 20x  - hip abd 20x   - march 20x   - hip ext 20x  - hurdle walking fwd and lat - 5 laps ea  - standing on foam pad - 3x (stopped d/t onset of some mild dizziness)   Neuromuscular re-ed: - NA  HOME EXERCISE PROGRAM: Access Code: [email or web address] URL: [email or web address] Date: 06/28/2021 Prepared by: William Hunter   Exercises Seated Hip Abduction with Resistance - 2 x daily - 7 x weekly - 3 sets - 10 reps Seated Knee Lifts with Resistance - 2 x daily - 7 x weekly - 3 sets - 10 reps Seated Hip Adduction Isometrics with Ball - 2 x daily - 7 x weekly - 1 sets - 10 reps - 10 hold Seated Long Arc Quad - 1 x daily - 7 x weekly - 3 sets - 10 reps     ASSESSMENT:   CLINICAL IMPRESSION: William Hunter is progressing well with therapy.  Pt reports no increase in baseline pain following therapy.  Today we concentrated on lower extremity strengthening, normalizing gait, and increasing activity tolerance.  Pt with improved activity tolerance today.  He shows better clearance with  hurdle stepping today  and reduced fatigue.  Pt will continue to benefit from skilled physical therapy to address remaining deficits and achieve listed goals.  Continue per POC.   GOALS:   SHORT TERM GOALS:   STG Name Target Date Goal status  1 Shakell will be >75% HEP compliant to improve carryover between sessions and facilitate independent management of condition   Baseline: No HEP  2/2: somewhat compliant 07/19/2021 Ongoing    LONG TERM GOALS:    LTG Name Target Date Goal status  1 Tc will improve 30'' STS (MCID 2) to >/= 8x (w/ UE?: Y) to show improved LE strength and improved transfers    Baseline: 4x  w/ UE? Y 08/23/2021 INITIAL  2 Stedman will improve 2 MWT to > 240 ft to show improved endurance and activity toelrance   Baseline: 180 ft 08/23/2021 INITIAL  3 Arad will improve 10 meter max gait speed to .8 m/s (.1 m/s MCID) to show functional improvement in ambulation    Eval: .56 m/s   Seen norms below:    08/23/2021 INITIAL  4 Clenton will report a >/= 60% improvement in ease of completion of ADLs   Baseline: limited 08/23/2021 INITIAL  5 Kasey will be able to stand for >30'' in semi-tandem stance, to show a significant improvement in balance in order to reduce fall risk    Baseline: 5'' with R in rear, unable with L 08/23/2021 INITIAL    PLAN: PT FREQUENCY: 1-2x/week   PT DURATION: 8 weeks (Ending 08/23/2021)   PLANNED INTERVENTIONS: Therapeutic exercises, Therapeutic activity, Neuro Muscular re-education, Gait training, Patient/Family education, Joint mobilization, Dry Needling, Electrical stimulation, Spinal mobilization and/or manipulation, Moist heat, Taping, Vasopneumatic device, Ionotophoresis 4mg /ml Dexamethasone, and Manual therapy   PLAN FOR NEXT SESSION: progressive standing activity tolerance and global strengthening    Kevan Ny Julicia Krieger PT 07/19/2021, 1:18 PM

## 2021-07-25 ENCOUNTER — Other Ambulatory Visit: Payer: Self-pay

## 2021-07-25 ENCOUNTER — Ambulatory Visit: Payer: Medicare HMO | Admitting: Physical Therapy

## 2021-07-25 ENCOUNTER — Encounter: Payer: Self-pay | Admitting: Physical Therapy

## 2021-07-25 DIAGNOSIS — M6281 Muscle weakness (generalized): Secondary | ICD-10-CM

## 2021-07-25 DIAGNOSIS — R2689 Other abnormalities of gait and mobility: Secondary | ICD-10-CM

## 2021-07-25 DIAGNOSIS — R2681 Unsteadiness on feet: Secondary | ICD-10-CM

## 2021-07-25 NOTE — Therapy (Signed)
OUTPATIENT PHYSICAL THERAPY TREATMENT NOTE   Patient Name: William Hunter MRN: [id number] DOB:Aug 03, 1945, 76 y.o., male Today's Date: 07/25/2021  PCP: [name], DO REFERRING PROVIDER: Ashok Pall, MD   PT End of Session - 07/25/21 1614     Visit Number 5    Number of Visits 16    Date for PT Re-Evaluation 08/23/21    Authorization Type [employer] MCR    PT Start Time 0415    PT Stop Time 0458    PT Time Calculation (min) 43 min    Equipment Utilized During Treatment Gait belt             Past Medical History:  Diagnosis Date   Anginal pain ([place])    Arthritis    "fingers" (03/09/2012)   Cold feet    "from my diabetes; they stay cold" (03/09/2012)   Coronary artery disease    Coronary artery spasm [facility])    since age 41 Dr Glade Lloyd   COVID 07/06/2019   hospitalizied   Dyslipidemia    Femoral DVT (deep venous thrombosis) ([name]    left   Headache(784.0)    Hx of gallstones    Dr Dalbert Batman lap cholecystectomy   Hx of plastic surgery    plastic surgery following a fall off a loading dock ,lost  all his upper teeth    Hypertension    MI, old    at age 67   Migraines    Pneumonia ~ 1962; 1970's   "double"; "regular" (03/09/2012)   Rheumatoid arthritis ([place])    Dr Amil Amen    Slipped cervical disc 06/10/2006   Dr Gaynelle Arabian in the past/ Dr Roderic Scarce , chiropractor in 2008   Thyroid disease    Dr Dwyane Dee   Type II diabetes mellitus [facility])    Past Surgical History:  Procedure Laterality Date   CARDIAC CATHETERIZATION     x 3 with NO stents    CHOLECYSTECTOMY  03/11/2012   Procedure: LAPAROSCOPIC CHOLECYSTECTOMY WITH INTRAOPERATIVE CHOLANGIOGRAM;  Surgeon: Adin Hector, MD;  Location: [place];  Service: General;  Laterality: N/A;   KYPHOPLASTY N/A 05/30/2021   Procedure: THORACIC FIVE KYPHOPLASTY;  Surgeon: Ashok Pall, MD;  Location: [place];  Service: Neurosurgery;  Laterality: N/A;  THORACIC FIVE KYPHOPLASTY   [place]   Patient Active  Problem List   Diagnosis Date Noted   Statin myopathy 02/22/2020   Acute respiratory failure with hypoxia ([place]) 07/03/2019   Acute respiratory failure due to COVID-19 ([place]) 07/03/2019   Migraine with aura and without status migrainosus, not intractable 09/02/2016   Diabetic polyneuropathy associated with type 2 diabetes mellitus ([place]) 09/02/2016   Rheumatoid arthritis ([place]) 09/02/2016   Coronary vasospasm ([place]) 04/06/2014   Gallstones 03/10/2012   Abdominal pain 03/09/2012   MI, old    Coronary artery disease    Hypertension    Dyslipidemia     REFERRING DIAG: Referral diagnosis: Wedge compression fracture of T5-T6 vertebra, initial encounter for closed fracture [S22.050A]  THERAPY DIAG:  Unsteadiness on feet  Other abnormalities of gait and mobility  Muscle weakness  PERTINENT HISTORY: Hx of minor HA, CAD, rheumatoid arthritis (somewhat controlled with medication), TII DM  PRECAUTIONS/RESTRICTIONS:   PRECAUTIONS: Other: RA, FALL RISK, kyphoplasty 12/21   WEIGHT BEARING RESTRICTIONS No  SUBJECTIVE:  Pt reports that he feels ok today.  "I've felt better, and I've felt worse."  Pain:  Are you having pain? Yes Pain location: Generalized rheumatoid arthritis pain in hands, wrists,  knees NPRS scale: current 5/10  Aggravating factors: no specific Relieving factors: no specific Pain description: constant Severity: high Irritability: low Stage: Chronic Stability: staying the same 24 hour pattern:    OBJECTIVE:  There were no vitals filed for this visit.  There is no height or weight on file to calculate BMI.   LE MMT: Seated MMT grossly 3+/5 (on eval)   MMT Right 06/28/2021 Left 06/28/2021  Hip flexion (L2, L3)      Knee extension (L3)      Knee flexion      Hip abduction      Hip extension      Hip external rotation      Hip internal rotation      Hip adduction      Ankle dorsiflexion (L4)      Ankle plantarflexion (S1)      Ankle inversion      Ankle  eversion      Great Toe ext (L5)        (Blank rows = not tested, score listed is out of 5 possible points.  N = WNL, D = diminished, C = clear for gross weakness with myotome testing, * = concordant pain with testing)     LE ROM:   ROM Right 06/28/2021 Left 06/28/2021  Hip flexion      Hip extension      Hip abduction      Hip adduction      Hip internal rotation      Hip external rotation      Knee flexion      Knee extension      Ankle dorsiflexion      Ankle plantarflexion      Ankle inversion      Ankle eversion         (Blank rows = not tested)     FUNCTIONAL TESTS:  Progressive balance screen:   Feet together: 10'' Semi Tandem: R in rear 5'', L in rear unable'   10 m max gait speed: 18'', .56 m/s, AD: Y quad cane   30'' STS: 4x    UE used? Y   2 MWT: 180'   TREATMENT 07/25/2021:  Therapeutic Exercise: - nu-step L6 59m while taking subjective and planning session with patient (LE only) - 10x STS from blue chair with airex pad - walking 180' for endurance (CGA) - in // - 2#  - heel raises 20x  - hip abd 20x   - march 20x   - HS curl 20x  - hip ext 20x  - hurdle walking fwd and lat - 5 laps ea   Neuromuscular re-ed: - NA  HOME EXERCISE PROGRAM: Access Code: [email or web address] URL: [email or web address] Date: 06/28/2021 Prepared by: Shearon Balo   Exercises Seated Hip Abduction with Resistance - 2 x daily - 7 x weekly - 3 sets - 10 reps Seated Knee Lifts with Resistance - 2 x daily - 7 x weekly - 3 sets - 10 reps Seated Hip Adduction Isometrics with Ball - 2 x daily - 7 x weekly - 1 sets - 10 reps - 10 hold Seated Long Arc Quad - 1 x daily - 7 x weekly - 3 sets - 10 reps     ASSESSMENT:   CLINICAL IMPRESSION: William Hunter is progressing well with therapy.  Pt reports no increase in baseline pain following therapy.  Today we concentrated on lower extremity strengthening and hip strengthening.  Pt able to progress standing exercise intensity  with added weight today.  He has less fatigue with standing exercises today indicating improved endurance.  Pt will continue to benefit from skilled physical therapy to address remaining deficits and achieve listed goals.  Continue per POC.   GOALS:   SHORT TERM GOALS:   STG Name Target Date Goal status  1 William Hunter will be >75% HEP compliant to improve carryover between sessions and facilitate independent management of condition   Baseline: No HEP  2/2: somewhat compliant 07/19/2021 Ongoing    LONG TERM GOALS:    LTG Name Target Date Goal status  1 William Hunter will improve 30'' STS (MCID 2) to >/= 8x (w/ UE?: Y) to show improved LE strength and improved transfers    Baseline: 4x  w/ UE? Y 08/23/2021 INITIAL  2 William Hunter will improve 2 MWT to > 240 ft to show improved endurance and activity toelrance   Baseline: 180 ft 08/23/2021 INITIAL  3 William Hunter will improve 10 meter max gait speed to .8 m/s (.1 m/s MCID) to show functional improvement in ambulation    Eval: .56 m/s   Seen norms below:    08/23/2021 INITIAL  4 William Hunter will report a >/= 60% improvement in ease of completion of ADLs   Baseline: limited 08/23/2021 INITIAL  5 William Hunter will be able to stand for >30'' in semi-tandem stance, to show a significant improvement in balance in order to reduce fall risk    Baseline: 5'' with R in rear, unable with L 08/23/2021 INITIAL    PLAN: PT FREQUENCY: 1-2x/week   PT DURATION: 8 weeks (Ending 08/23/2021)   PLANNED INTERVENTIONS: Therapeutic exercises, Therapeutic activity, Neuro Muscular re-education, Gait training, Patient/Family education, Joint mobilization, Dry Needling, Electrical stimulation, Spinal mobilization and/or manipulation, Moist heat, Taping, Vasopneumatic device, Ionotophoresis 4mg /ml Dexamethasone, and Manual therapy   PLAN FOR NEXT SESSION: progressive standing activity tolerance and global strengthening    Kevan Ny Masao Junker PT 07/25/2021, 4:58 PM

## 2021-07-30 DIAGNOSIS — Z125 Encounter for screening for malignant neoplasm of prostate: Secondary | ICD-10-CM | POA: Diagnosis not present

## 2021-07-30 DIAGNOSIS — R296 Repeated falls: Secondary | ICD-10-CM | POA: Diagnosis not present

## 2021-07-30 DIAGNOSIS — E559 Vitamin D deficiency, unspecified: Secondary | ICD-10-CM | POA: Diagnosis not present

## 2021-07-30 DIAGNOSIS — Z794 Long term (current) use of insulin: Secondary | ICD-10-CM | POA: Diagnosis not present

## 2021-07-30 DIAGNOSIS — R42 Dizziness and giddiness: Secondary | ICD-10-CM | POA: Diagnosis not present

## 2021-07-30 DIAGNOSIS — R413 Other amnesia: Secondary | ICD-10-CM | POA: Diagnosis not present

## 2021-07-30 DIAGNOSIS — E538 Deficiency of other specified B group vitamins: Secondary | ICD-10-CM | POA: Diagnosis not present

## 2021-07-30 DIAGNOSIS — E0590 Thyrotoxicosis, unspecified without thyrotoxic crisis or storm: Secondary | ICD-10-CM | POA: Diagnosis not present

## 2021-07-30 DIAGNOSIS — E1140 Type 2 diabetes mellitus with diabetic neuropathy, unspecified: Secondary | ICD-10-CM | POA: Diagnosis not present

## 2021-07-31 ENCOUNTER — Other Ambulatory Visit: Payer: Self-pay | Admitting: Family Medicine

## 2021-07-31 ENCOUNTER — Encounter: Payer: Self-pay | Admitting: Physical Therapy

## 2021-07-31 ENCOUNTER — Other Ambulatory Visit: Payer: Self-pay

## 2021-07-31 ENCOUNTER — Ambulatory Visit: Payer: Medicare HMO | Admitting: Physical Therapy

## 2021-07-31 DIAGNOSIS — E559 Vitamin D deficiency, unspecified: Secondary | ICD-10-CM | POA: Diagnosis not present

## 2021-07-31 DIAGNOSIS — M6281 Muscle weakness (generalized): Secondary | ICD-10-CM

## 2021-07-31 DIAGNOSIS — R2681 Unsteadiness on feet: Secondary | ICD-10-CM | POA: Diagnosis not present

## 2021-07-31 DIAGNOSIS — R42 Dizziness and giddiness: Secondary | ICD-10-CM | POA: Diagnosis not present

## 2021-07-31 DIAGNOSIS — R296 Repeated falls: Secondary | ICD-10-CM

## 2021-07-31 DIAGNOSIS — R413 Other amnesia: Secondary | ICD-10-CM | POA: Diagnosis not present

## 2021-07-31 DIAGNOSIS — E538 Deficiency of other specified B group vitamins: Secondary | ICD-10-CM | POA: Diagnosis not present

## 2021-07-31 DIAGNOSIS — E1140 Type 2 diabetes mellitus with diabetic neuropathy, unspecified: Secondary | ICD-10-CM | POA: Diagnosis not present

## 2021-07-31 DIAGNOSIS — R2689 Other abnormalities of gait and mobility: Secondary | ICD-10-CM

## 2021-07-31 DIAGNOSIS — E0590 Thyrotoxicosis, unspecified without thyrotoxic crisis or storm: Secondary | ICD-10-CM | POA: Diagnosis not present

## 2021-07-31 DIAGNOSIS — Z125 Encounter for screening for malignant neoplasm of prostate: Secondary | ICD-10-CM | POA: Diagnosis not present

## 2021-07-31 LAB — LAB REPORT - SCANNED
A1c: 11
EGFR: 61

## 2021-07-31 NOTE — Therapy (Signed)
OUTPATIENT PHYSICAL THERAPY TREATMENT NOTE   Patient Name: FARLEY MCCARLEY MRN: [id number] DOB:09-07-1945, 76 y.o., male Today's Date: 07/31/2021  PCP: [name], DO REFERRING PROVIDER: [name], DO   PT End of Session - 07/31/21 1300     Visit Number 6    Number of Visits 16    Date for PT Re-Evaluation 08/23/21    Authorization Type [employer] MCR    PT Start Time 1300    PT Stop Time 1340    PT Time Calculation (min) 40 min    Equipment Utilized During Treatment Gait belt             Past Medical History:  Diagnosis Date   Anginal pain ([place])    Arthritis    "fingers" (03/09/2012)   Cold feet    "from my diabetes; they stay cold" (03/09/2012)   Coronary artery disease    Coronary artery spasm [facility])    since age 60 Dr Glade Lloyd   COVID 07/06/2019   hospitalizied   Dyslipidemia    Femoral DVT (deep venous thrombosis) ([name]    left   Headache(784.0)    Hx of gallstones    Dr Dalbert Batman lap cholecystectomy   Hx of plastic surgery    plastic surgery following a fall off a loading dock ,lost  all his upper teeth    Hypertension    MI, old    at age 70   Migraines    Pneumonia ~ 1962; 1970's   "double"; "regular" (03/09/2012)   Rheumatoid arthritis ([place])    Dr Amil Amen    Slipped cervical disc 06/10/2006   Dr Gaynelle Arabian in the past/ Dr Roderic Scarce , chiropractor in 2008   Thyroid disease    Dr Dwyane Dee   Type II diabetes mellitus [facility])    Past Surgical History:  Procedure Laterality Date   CARDIAC CATHETERIZATION     x 3 with NO stents    CHOLECYSTECTOMY  03/11/2012   Procedure: LAPAROSCOPIC CHOLECYSTECTOMY WITH INTRAOPERATIVE CHOLANGIOGRAM;  Surgeon: Adin Hector, MD;  Location: [place];  Service: General;  Laterality: N/A;   KYPHOPLASTY N/A 05/30/2021   Procedure: THORACIC FIVE KYPHOPLASTY;  Surgeon: Ashok Pall, MD;  Location: [place];  Service: Neurosurgery;  Laterality: N/A;  THORACIC FIVE KYPHOPLASTY   [place]   Patient Active  Problem List   Diagnosis Date Noted   Statin myopathy 02/22/2020   Acute respiratory failure with hypoxia ([place]) 07/03/2019   Acute respiratory failure due to COVID-19 ([place]) 07/03/2019   Migraine with aura and without status migrainosus, not intractable 09/02/2016   Diabetic polyneuropathy associated with type 2 diabetes mellitus ([place]) 09/02/2016   Rheumatoid arthritis ([place]) 09/02/2016   Coronary vasospasm ([place]) 04/06/2014   Gallstones 03/10/2012   Abdominal pain 03/09/2012   MI, old    Coronary artery disease    Hypertension    Dyslipidemia     REFERRING DIAG: Referral diagnosis: Wedge compression fracture of T5-T6 vertebra, initial encounter for closed fracture [S22.050A]  THERAPY DIAG:  Unsteadiness on feet  Other abnormalities of gait and mobility  Muscle weakness  PERTINENT HISTORY: Hx of minor HA, CAD, rheumatoid arthritis (somewhat controlled with medication), TII DM  PRECAUTIONS/RESTRICTIONS:   PRECAUTIONS: Other: RA, FALL RISK, kyphoplasty 12/21   WEIGHT BEARING RESTRICTIONS No  SUBJECTIVE:  Pt reports that he is feeling good overall.  He feels more stable in walking and has not had any falls.  Pain:  Are you having pain? Yes Pain location: Generalized  rheumatoid arthritis pain in hands, wrists, knees NPRS scale: current 4/10  Aggravating factors: no specific Relieving factors: no specific Pain description: constant Severity: high Irritability: low Stage: Chronic Stability: staying the same 24 hour pattern:    OBJECTIVE:  There were no vitals filed for this visit.  There is no height or weight on file to calculate BMI.   LE MMT: Seated MMT grossly 3+/5 (on eval)   MMT Right 06/28/2021 Left 06/28/2021  Hip flexion (L2, L3)      Knee extension (L3)      Knee flexion      Hip abduction      Hip extension      Hip external rotation      Hip internal rotation      Hip adduction      Ankle dorsiflexion (L4)      Ankle plantarflexion (S1)       Ankle inversion      Ankle eversion      Great Toe ext (L5)        (Blank rows = not tested, score listed is out of 5 possible points.  N = WNL, D = diminished, C = clear for gross weakness with myotome testing, * = concordant pain with testing)     LE ROM:   ROM Right 06/28/2021 Left 06/28/2021  Hip flexion      Hip extension      Hip abduction      Hip adduction      Hip internal rotation      Hip external rotation      Knee flexion      Knee extension      Ankle dorsiflexion      Ankle plantarflexion      Ankle inversion      Ankle eversion         (Blank rows = not tested)     FUNCTIONAL TESTS:  Progressive balance screen:   Feet together: 10'' Semi Tandem: R in rear 5'', L in rear unable'   10 m max gait speed: 18'', .56 m/s, AD: Y quad cane   30'' STS: 4x    UE used? Y   2 MWT: 180'   TREATMENT 07/31/2021:  Therapeutic Exercise: - nu-step L7 2m while taking subjective and planning session with patient (LE only) - 10x STS from blue chair with airex pad (NT) - walking 180' for endurance (CGA) - in // - 4#  - heel raises 20x2 on airex  - hip abd 20x2  - march 20x (NT)  - HS curl 20x (NT)  - hip ext 20x (NT)  - hurdle walking fwd and lat - 5 laps ea   Neuromuscular re-ed: - NA  HOME EXERCISE PROGRAM: Access Code: [email or web address] URL: [email or web address] Date: 06/28/2021 Prepared by: Shearon Balo   Exercises Seated Hip Abduction with Resistance - 2 x daily - 7 x weekly - 3 sets - 10 reps Seated Knee Lifts with Resistance - 2 x daily - 7 x weekly - 3 sets - 10 reps Seated Hip Adduction Isometrics with Ball - 2 x daily - 7 x weekly - 1 sets - 10 reps - 10 hold Seated Long Arc Quad - 1 x daily - 7 x weekly - 3 sets - 10 reps     ASSESSMENT:   CLINICAL IMPRESSION: Szymon is progressing well with therapy.  Pt reports no increase in baseline pain following therapy.  Today we concentrated on lower extremity strengthening and  balance/proprioception.  Pt with some continued dizziness today during sit to stands.  This resolved with rest but limited completing STS.  Overall his strength and standing endurance is improving.  He must be cued to avoid circumduction around hurdles, but is able to complete with good form after cuing.  Pt will continue to benefit from skilled physical therapy to address remaining deficits and achieve listed goals.  Continue per POC.   GOALS:   SHORT TERM GOALS:   STG Name Target Date Goal status  1 Hipolito will be >75% HEP compliant to improve carryover between sessions and facilitate independent management of condition   Baseline: No HEP  2/2: somewhat compliant 07/19/2021 Ongoing    LONG TERM GOALS:    LTG Name Target Date Goal status  1 Sivan will improve 30'' STS (MCID 2) to >/= 8x (w/ UE?: Y) to show improved LE strength and improved transfers    Baseline: 4x  w/ UE? Y 08/23/2021 INITIAL  2 Jonathon will improve 2 MWT to > 240 ft to show improved endurance and activity toelrance   Baseline: 180 ft 08/23/2021 INITIAL  3 Abdulmohsen will improve 10 meter max gait speed to .8 m/s (.1 m/s MCID) to show functional improvement in ambulation    Eval: .56 m/s   Seen norms below:    08/23/2021 INITIAL  4 Jovonte will report a >/= 60% improvement in ease of completion of ADLs   Baseline: limited 08/23/2021 INITIAL  5 Damilola will be able to stand for >30'' in semi-tandem stance, to show a significant improvement in balance in order to reduce fall risk    Baseline: 5'' with R in rear, unable with L 08/23/2021 INITIAL    PLAN: PT FREQUENCY: 1-2x/week   PT DURATION: 8 weeks (Ending 08/23/2021)   PLANNED INTERVENTIONS: Therapeutic exercises, Therapeutic activity, Neuro Muscular re-education, Gait training, Patient/Family education, Joint mobilization, Dry Needling, Electrical stimulation, Spinal mobilization and/or manipulation, Moist heat, Taping, Vasopneumatic device, Ionotophoresis 4mg /ml  Dexamethasone, and Manual therapy   PLAN FOR NEXT SESSION: progressive standing activity tolerance and global strengthening    Kevan Ny Jamesina Gaugh PT 07/31/2021, 1:12 PM

## 2021-08-01 LAB — LAB REPORT - SCANNED
Creatinine, POC: 39.6 mg/dL
Microalb Creat Ratio: 8
Microalbumin, Urine: 0.3

## 2021-08-02 ENCOUNTER — Ambulatory Visit: Payer: Medicare HMO | Admitting: Physical Therapy

## 2021-08-02 ENCOUNTER — Encounter: Payer: Self-pay | Admitting: Physical Therapy

## 2021-08-02 ENCOUNTER — Other Ambulatory Visit: Payer: Self-pay

## 2021-08-02 DIAGNOSIS — R2681 Unsteadiness on feet: Secondary | ICD-10-CM | POA: Diagnosis not present

## 2021-08-02 DIAGNOSIS — M6281 Muscle weakness (generalized): Secondary | ICD-10-CM

## 2021-08-02 DIAGNOSIS — R2689 Other abnormalities of gait and mobility: Secondary | ICD-10-CM | POA: Diagnosis not present

## 2021-08-02 DIAGNOSIS — Z006 Encounter for examination for normal comparison and control in clinical research program: Secondary | ICD-10-CM

## 2021-08-02 NOTE — Research (Cosign Needed)
Patient seen in the clinic for ORION 4 Month 15. Medications reviewed and updated in Epic and EDC. Denies any recent hospitalizations for cardiovascular events but did have surgery on his back in Dec 2022. No other issues noted at this visit. IP was given in [facility] at 0925 without complications noted. Labs were drawn per the [facility] 4 protocol. Next appointment is Month 21 and scheduled for Aug 22 @ 1pm   Current Outpatient Medications:    acetaminophen (TYLENOL) 500 MG tablet, Take 1,000 mg by mouth every 6 (six) hours as needed for moderate pain., Disp: , Rfl:    diltiazem (TIAZAC) 300 MG 24 hr capsule, Take 1 capsule (300 mg total) by mouth daily., Disp: 90 capsule, Rfl: 3   folic acid (FOLVITE) 1 MG tablet, Take 1 mg by mouth daily., Disp: , Rfl:    HYDROcodone-acetaminophen (NORCO/VICODIN) 5-325 MG tablet, Take 1 tablet by mouth every 6 (six) hours as needed for moderate pain., Disp: , Rfl:    Insulin Glargine (BASAGLAR KWIKPEN) 100 UNIT/ML, Inject 30 Units into the skin at bedtime., Disp: , Rfl:    insulin lispro (HUMALOG) 100 UNIT/ML KwikPen, Inject 20-35 Units into the skin with breakfast, with lunch, and with evening meal., Disp: , Rfl:    loratadine (CLARITIN) 10 MG tablet, Take 10 mg by mouth daily as needed for allergies., Disp: , Rfl:    methotrexate (RHEUMATREX) 2.5 MG tablet, Take 15 mg by mouth every Wednesday. Caution:Chemotherapy. Protect from light., Disp: , Rfl:

## 2021-08-02 NOTE — Therapy (Signed)
OUTPATIENT PHYSICAL THERAPY TREATMENT NOTE   Patient Name: William Hunter MRN: [id number] DOB:December 12, 1945, 76 y.o., male Today's Date: 08/02/2021  PCP: [name], DO REFERRING PROVIDER: Ashok Pall, MD   William Hunter End of Session - 08/02/21 1306     Visit Number 7    Number of Visits 16    Date for William Hunter Re-Evaluation 08/23/21    Authorization Type [employer] MCR    William Hunter Start Time 1302    William Hunter Stop Time 1343    William Hunter Time Calculation (min) 41 min    Equipment Utilized During Treatment Gait belt             Past Medical History:  Diagnosis Date   Anginal pain ([place])    Arthritis    "fingers" (03/09/2012)   Cold feet    "from my diabetes; they stay cold" (03/09/2012)   Coronary artery disease    Coronary artery spasm [facility])    since age 23 Dr Glade Lloyd   COVID 07/06/2019   hospitalizied   Dyslipidemia    Femoral DVT (deep venous thrombosis) ([name]    left   Headache(784.0)    Hx of gallstones    Dr Dalbert Batman lap cholecystectomy   Hx of plastic surgery    plastic surgery following a fall off a loading dock ,lost  all his upper teeth    Hypertension    MI, old    at age 82   Migraines    Pneumonia ~ 1962; 1970's   "double"; "regular" (03/09/2012)   Rheumatoid arthritis ([place])    Dr Amil Amen    Slipped cervical disc 06/10/2006   Dr Gaynelle Arabian in the past/ Dr Roderic Scarce , chiropractor in 2008   Thyroid disease    Dr Dwyane Dee   Type II diabetes mellitus [facility])    Past Surgical History:  Procedure Laterality Date   CARDIAC CATHETERIZATION     x 3 with NO stents    CHOLECYSTECTOMY  03/11/2012   Procedure: LAPAROSCOPIC CHOLECYSTECTOMY WITH INTRAOPERATIVE CHOLANGIOGRAM;  Surgeon: Adin Hector, MD;  Location: [place];  Service: General;  Laterality: N/A;   KYPHOPLASTY N/A 05/30/2021   Procedure: THORACIC FIVE KYPHOPLASTY;  Surgeon: Ashok Pall, MD;  Location: [place];  Service: Neurosurgery;  Laterality: N/A;  THORACIC FIVE KYPHOPLASTY   [place]   Patient Active  Problem List   Diagnosis Date Noted   Statin myopathy 02/22/2020   Acute respiratory failure with hypoxia ([place]) 07/03/2019   Acute respiratory failure due to COVID-19 ([place]) 07/03/2019   Migraine with aura and without status migrainosus, not intractable 09/02/2016   Diabetic polyneuropathy associated with type 2 diabetes mellitus ([place]) 09/02/2016   Rheumatoid arthritis ([place]) 09/02/2016   Coronary vasospasm ([place]) 04/06/2014   Gallstones 03/10/2012   Abdominal pain 03/09/2012   MI, old    Coronary artery disease    Hypertension    Dyslipidemia     REFERRING DIAG: Referral diagnosis: Wedge compression fracture of T5-T6 vertebra, initial encounter for closed fracture [S22.050A]  THERAPY DIAG:  Unsteadiness on feet  Other abnormalities of gait and mobility  Muscle weakness  PERTINENT HISTORY: Hx of minor HA, CAD, rheumatoid arthritis (somewhat controlled with medication), TII DM  PRECAUTIONS/RESTRICTIONS:   PRECAUTIONS: Other: RA, FALL RISK, kyphoplasty 12/21   WEIGHT BEARING RESTRICTIONS No  SUBJECTIVE:  William Hunter reports that he was able to walk 1/2 a mile to an MD appt this morning.  He feels this is an improvement.  He denies falls.  Pain:  Are  you having pain? Yes Pain location: Generalized rheumatoid arthritis pain in hands, wrists, knees NPRS scale: current 4/10  Aggravating factors: no specific Relieving factors: no specific Pain description: constant Severity: high Irritability: low Stage: Chronic   OBJECTIVE:  There were no vitals filed for this visit.  There is no height or weight on file to calculate BMI.   LE MMT: Seated MMT grossly 3+/5 (on eval)   MMT Right 06/28/2021 Left 06/28/2021  Hip flexion (L2, L3)      Knee extension (L3)      Knee flexion      Hip abduction      Hip extension      Hip external rotation      Hip internal rotation      Hip adduction      Ankle dorsiflexion (L4)      Ankle plantarflexion (S1)      Ankle inversion      Ankle  eversion      Great Toe ext (L5)        (Blank rows = not tested, score listed is out of 5 possible points.  N = WNL, D = diminished, C = clear for gross weakness with myotome testing, * = concordant pain with testing)     LE ROM:   ROM Right 06/28/2021 Left 06/28/2021  Hip flexion      Hip extension      Hip abduction      Hip adduction      Hip internal rotation      Hip external rotation      Knee flexion      Knee extension      Ankle dorsiflexion      Ankle plantarflexion      Ankle inversion      Ankle eversion         (Blank rows = not tested)     FUNCTIONAL TESTS:  Progressive balance screen:   Feet together: 10'' Semi Tandem: R in rear 5'', L in rear unable'   10 m max gait speed: 18'', .56 m/s, AD: Y quad cane   30'' STS: 4x    UE used? Y   2 MWT: 180'    ASTERISK SIGNS   Asterisk Signs 2/23       Balance  ~15'' semi tandem (50%) on foam       2 MWT        Gait speed                           TREATMENT 08/02/2021:  Therapeutic Exercise: - nu-step L7 18m while taking subjective and planning session with patient (LE only) - 2x5 STS from blue chair with airex pad  - walking 180' for endurance (CGA) - in // - 3#  - heel raises 20x2 on airex  - hip abd 20x2  - hurdle walking fwd and lat - 4 laps ea   Neuromuscular re-ed: - semi-tandem on airex  HOME EXERCISE PROGRAM: Access Code: [email or web address] URL: [email or web address] Date: 06/28/2021 Prepared by: Shearon Balo   Exercises Seated Hip Abduction with Resistance - 2 x daily - 7 x weekly - 3 sets - 10 reps Seated Knee Lifts with Resistance - 2 x daily - 7 x weekly - 3 sets - 10 reps Seated Hip Adduction Isometrics with Ball - 2 x daily - 7 x weekly - 1 sets - 10 reps - 10 hold Seated Long Arc Quad -  1 x daily - 7 x weekly - 3 sets - 10 reps     ASSESSMENT:   CLINICAL IMPRESSION: William Hunter is progressing well with therapy.  William Hunter reports no increase in baseline pain following  therapy.  Today we concentrated on lower extremity strengthening, hip strengthening, and increasing activity tolerance.  William Hunter continues to show improvements in endurance with reduced need for rest breaks.  He also shows improved clearance in hurdle walking today showing improvement in step height and length compared to start of therapy.  We were able to add back in some balance work.  William Hunter will continue to benefit from skilled physical therapy to address remaining deficits and achieve listed goals.  Continue per POC.   GOALS:   SHORT TERM GOALS:   STG Name Target Date Goal status  1 Munasar will be >75% HEP compliant to improve carryover between sessions and facilitate independent management of condition   Baseline: No HEP  2/2: somewhat compliant 07/19/2021 Ongoing    LONG TERM GOALS:    LTG Name Target Date Goal status  1 Mekael will improve 30'' STS (MCID 2) to >/= 8x (w/ UE?: Y) to show improved LE strength and improved transfers    Baseline: 4x  w/ UE? Y 08/23/2021 INITIAL  2 [place] will improve 2 MWT to > 240 ft to show improved endurance and activity toelrance   Baseline: 180 ft 08/23/2021 INITIAL  3 Jiles will improve 10 meter max gait speed to .8 m/s (.1 m/s MCID) to show functional improvement in ambulation    Eval: .56 m/s   Seen norms below:    08/23/2021 INITIAL  4 [place] will report a >/= 60% improvement in ease of completion of ADLs   Baseline: limited 08/23/2021 INITIAL  5 [place] will be able to stand for >30'' in semi-tandem stance, to show a significant improvement in balance in order to reduce fall risk    Baseline: 5'' with R in rear, unable with L 08/23/2021 INITIAL    PLAN: William Hunter FREQUENCY: 1-2x/week   William Hunter DURATION: 8 weeks (Ending 08/23/2021)   PLANNED INTERVENTIONS: Therapeutic exercises, Therapeutic activity, Neuro Muscular re-education, Gait training, Patient/Family education, Joint mobilization, Dry Needling, Electrical stimulation, Spinal mobilization and/or  manipulation, Moist heat, Taping, Vasopneumatic device, Ionotophoresis 4mg /ml Dexamethasone, and Manual therapy   PLAN FOR NEXT SESSION: progressive standing activity tolerance and global strengthening    Kevan Ny Ashunti Schofield William Hunter 08/02/2021, 1:40 PM

## 2021-08-07 ENCOUNTER — Encounter: Payer: Self-pay | Admitting: Physical Therapy

## 2021-08-07 ENCOUNTER — Other Ambulatory Visit: Payer: Self-pay

## 2021-08-07 ENCOUNTER — Ambulatory Visit: Payer: Medicare HMO | Admitting: Physical Therapy

## 2021-08-07 DIAGNOSIS — R2681 Unsteadiness on feet: Secondary | ICD-10-CM | POA: Diagnosis not present

## 2021-08-07 DIAGNOSIS — M6281 Muscle weakness (generalized): Secondary | ICD-10-CM

## 2021-08-07 DIAGNOSIS — R2689 Other abnormalities of gait and mobility: Secondary | ICD-10-CM

## 2021-08-07 NOTE — Therapy (Signed)
OUTPATIENT PHYSICAL THERAPY TREATMENT NOTE   Patient Name: William Hunter MRN: [id number] DOB:09-05-1945, 76 y.o., male Today's Date: 08/07/2021  PCP: [name], DO REFERRING PROVIDER: [name], DO   PT End of Session - 08/07/21 1305     Visit Number 8    Number of Visits 16    Date for PT Re-Evaluation 08/23/21    Authorization Type [employer] MCR    PT Start Time 0104    PT Stop Time 0144    PT Time Calculation (min) 40 min    Equipment Utilized During Treatment Gait belt             Past Medical History:  Diagnosis Date   Anginal pain ([place])    Arthritis    "fingers" (03/09/2012)   Cold feet    "from my diabetes; they stay cold" (03/09/2012)   Coronary artery disease    Coronary artery spasm [facility])    since age 37 Dr Glade Lloyd   COVID 07/06/2019   hospitalizied   Dyslipidemia    Femoral DVT (deep venous thrombosis) ([name]    left   Headache(784.0)    Hx of gallstones    Dr Dalbert Batman lap cholecystectomy   Hx of plastic surgery    plastic surgery following a fall off a loading dock ,lost  all his upper teeth    Hypertension    MI, old    at age 72   Migraines    Pneumonia ~ 1962; 1970's   "double"; "regular" (03/09/2012)   Rheumatoid arthritis ([place])    Dr Amil Amen    Slipped cervical disc 06/10/2006   Dr Gaynelle Arabian in the past/ Dr Roderic Scarce , chiropractor in 2008   Thyroid disease    Dr Dwyane Dee   Type II diabetes mellitus [facility])    Past Surgical History:  Procedure Laterality Date   CARDIAC CATHETERIZATION     x 3 with NO stents    CHOLECYSTECTOMY  03/11/2012   Procedure: LAPAROSCOPIC CHOLECYSTECTOMY WITH INTRAOPERATIVE CHOLANGIOGRAM;  Surgeon: Adin Hector, MD;  Location: [place];  Service: General;  Laterality: N/A;   KYPHOPLASTY N/A 05/30/2021   Procedure: THORACIC FIVE KYPHOPLASTY;  Surgeon: Ashok Pall, MD;  Location: [place];  Service: Neurosurgery;  Laterality: N/A;  THORACIC FIVE KYPHOPLASTY   [place]   Patient Active  Problem List   Diagnosis Date Noted   Statin myopathy 02/22/2020   Acute respiratory failure with hypoxia ([place]) 07/03/2019   Acute respiratory failure due to COVID-19 ([place]) 07/03/2019   Migraine with aura and without status migrainosus, not intractable 09/02/2016   Diabetic polyneuropathy associated with type 2 diabetes mellitus ([place]) 09/02/2016   Rheumatoid arthritis ([place]) 09/02/2016   Coronary vasospasm ([place]) 04/06/2014   Gallstones 03/10/2012   Abdominal pain 03/09/2012   MI, old    Coronary artery disease    Hypertension    Dyslipidemia     REFERRING DIAG: Referral diagnosis: Wedge compression fracture of T5-T6 vertebra, initial encounter for closed fracture [S22.050A]  THERAPY DIAG:  Unsteadiness on feet  Other abnormalities of gait and mobility  Muscle weakness  PERTINENT HISTORY: Hx of minor HA, CAD, rheumatoid arthritis (somewhat controlled with medication), TII DM  PRECAUTIONS/RESTRICTIONS:   PRECAUTIONS: Other: RA, FALL RISK, kyphoplasty 12/21   WEIGHT BEARING RESTRICTIONS No  SUBJECTIVE:  Pt reports he is having a tough time today with pain and stiffness.  Pain:  Are you having pain? Yes Pain location: Generalized rheumatoid arthritis pain in hands, wrists, knees NPRS  scale: current 4/10, L knee  Aggravating factors: no specific Relieving factors: no specific Pain description: constant Severity: high Irritability: low Stage: Chronic   OBJECTIVE:  There were no vitals filed for this visit.  There is no height or weight on file to calculate BMI.   LE MMT: Seated MMT grossly 3+/5 (on eval)   MMT Right 06/28/2021 Left 06/28/2021  Hip flexion (L2, L3)      Knee extension (L3)      Knee flexion      Hip abduction      Hip extension      Hip external rotation      Hip internal rotation      Hip adduction      Ankle dorsiflexion (L4)      Ankle plantarflexion (S1)      Ankle inversion      Ankle eversion      Great Toe ext (L5)        (Blank  rows = not tested, score listed is out of 5 possible points.  N = WNL, D = diminished, C = clear for gross weakness with myotome testing, * = concordant pain with testing)     LE ROM:   ROM Right 06/28/2021 Left 06/28/2021  Hip flexion      Hip extension      Hip abduction      Hip adduction      Hip internal rotation      Hip external rotation      Knee flexion      Knee extension      Ankle dorsiflexion      Ankle plantarflexion      Ankle inversion      Ankle eversion         (Blank rows = not tested)     FUNCTIONAL TESTS:  Progressive balance screen:   Feet together: 10'' Semi Tandem: R in rear 5'', L in rear unable'   10 m max gait speed: 18'', .56 m/s, AD: Y quad cane   30'' STS: 4x    UE used? Y   2 MWT: 180'    ASTERISK SIGNS   Asterisk Signs 2/23 2/28      Balance  ~15'' semi tandem (50%) on foam Semi tandem (100%) >25'' ea      2 MWT        Gait speed                           TREATMENT 08/07/2021:  Therapeutic Exercise: - nu-step L7 63m while taking subjective and planning session with patient (LE only) - 2x5 STS from blue chair with airex pad (NT) - walking 180' for endurance (CGA) - in // - 4#  - heel raises 20x2 on airex  - hip abd 20x2  - hurdle walking fwd and lat - 4 laps ea   Neuromuscular re-ed: - semi tandem stance 100% - 30'' bouts  HOME EXERCISE PROGRAM: Access Code: [email or web address] URL: [email or web address] Date: 06/28/2021 Prepared by: Shearon Balo   Exercises Seated Hip Abduction with Resistance - 2 x daily - 7 x weekly - 3 sets - 10 reps Seated Knee Lifts with Resistance - 2 x daily - 7 x weekly - 3 sets - 10 reps Seated Hip Adduction Isometrics with Ball - 2 x daily - 7 x weekly - 1 sets - 10 reps - 10 hold Seated Long Arc Quad - 1 x daily -  7 x weekly - 3 sets - 10 reps     ASSESSMENT:   CLINICAL IMPRESSION: William Hunter is progressing well with therapy.  Pt reports no increase in baseline pain following  therapy.  Today we concentrated on balance/proprioception and increasing activity tolerance.  Pt with improved balance today despite higher level of baseline pain; he is close to reaching his balance goal.  We did not complete STS d/t dizziness today.  Pt will continue to benefit from skilled physical therapy to address remaining deficits and achieve listed goals.  Continue per POC.   GOALS:   SHORT TERM GOALS:   STG Name Target Date Goal status  1 William Hunter will be >75% HEP compliant to improve carryover between sessions and facilitate independent management of condition   Baseline: No HEP  2/2: somewhat compliant 07/19/2021 Ongoing    LONG TERM GOALS:    LTG Name Target Date Goal status  1 William Hunter will improve 30'' STS (MCID 2) to >/= 8x (w/ UE?: Y) to show improved LE strength and improved transfers    Baseline: 4x  w/ UE? Y 08/23/2021 INITIAL  2 William Hunter will improve 2 MWT to > 240 ft to show improved endurance and activity toelrance   Baseline: 180 ft 08/23/2021 INITIAL  3 William Hunter will improve 10 meter max gait speed to .8 m/s (.1 m/s MCID) to show functional improvement in ambulation    Eval: .56 m/s   Seen norms below:    08/23/2021 INITIAL  4 William Hunter will report a >/= 60% improvement in ease of completion of ADLs   Baseline: limited 08/23/2021 INITIAL  5 William Hunter will be able to stand for >30'' in semi-tandem stance, to show a significant improvement in balance in order to reduce fall risk    Baseline: 5'' with R in rear, unable with L 08/23/2021 INITIAL    PLAN: PT FREQUENCY: 1-2x/week   PT DURATION: 8 weeks (Ending 08/23/2021)   PLANNED INTERVENTIONS: Therapeutic exercises, Therapeutic activity, Neuro Muscular re-education, Gait training, Patient/Family education, Joint mobilization, Dry Needling, Electrical stimulation, Spinal mobilization and/or manipulation, Moist heat, Taping, Vasopneumatic device, Ionotophoresis 4mg /ml Dexamethasone, and Manual therapy   PLAN FOR NEXT SESSION:  progressive standing activity tolerance and global strengthening    Kevan Ny Dana Dorner PT 08/07/2021, 1:51 PM

## 2021-08-09 ENCOUNTER — Encounter: Payer: Self-pay | Admitting: Physical Therapy

## 2021-08-09 ENCOUNTER — Ambulatory Visit: Payer: Medicare HMO | Attending: Neurosurgery | Admitting: Physical Therapy

## 2021-08-09 ENCOUNTER — Other Ambulatory Visit: Payer: Self-pay

## 2021-08-09 DIAGNOSIS — M6281 Muscle weakness (generalized): Secondary | ICD-10-CM | POA: Insufficient documentation

## 2021-08-09 DIAGNOSIS — R2681 Unsteadiness on feet: Secondary | ICD-10-CM | POA: Insufficient documentation

## 2021-08-09 DIAGNOSIS — R2689 Other abnormalities of gait and mobility: Secondary | ICD-10-CM | POA: Diagnosis not present

## 2021-08-09 NOTE — Therapy (Signed)
OUTPATIENT PHYSICAL THERAPY TREATMENT NOTE   Patient Name: William Hunter MRN: [id number] DOB:09/10/1945, 76 y.o., male Today's Date: 08/09/2021  PCP: [name], DO REFERRING PROVIDER: Ashok Pall, MD   PT End of Session - 08/09/21 1301     Visit Number 9    Number of Visits 16    Date for PT Re-Evaluation 08/23/21    Authorization Type [employer] MCR    PT Start Time 1300    PT Stop Time 1340    PT Time Calculation (min) 40 min    Equipment Utilized During Treatment Gait belt             Past Medical History:  Diagnosis Date   Anginal pain ([place])    Arthritis    "fingers" (03/09/2012)   Cold feet    "from my diabetes; they stay cold" (03/09/2012)   Coronary artery disease    Coronary artery spasm [facility])    since age 63 Dr Glade Lloyd   COVID 07/06/2019   hospitalizied   Dyslipidemia    Femoral DVT (deep venous thrombosis) ([name]    left   Headache(784.0)    Hx of gallstones    Dr Dalbert Batman lap cholecystectomy   Hx of plastic surgery    plastic surgery following a fall off a loading dock ,lost  all his upper teeth    Hypertension    MI, old    at age 25   Migraines    Pneumonia ~ 1962; 1970's   "double"; "regular" (03/09/2012)   Rheumatoid arthritis ([place])    Dr Amil Amen    Slipped cervical disc 06/10/2006   Dr Gaynelle Arabian in the past/ Dr Roderic Scarce , chiropractor in 2008   Thyroid disease    Dr Dwyane Dee   Type II diabetes mellitus [facility])    Past Surgical History:  Procedure Laterality Date   CARDIAC CATHETERIZATION     x 3 with NO stents    CHOLECYSTECTOMY  03/11/2012   Procedure: LAPAROSCOPIC CHOLECYSTECTOMY WITH INTRAOPERATIVE CHOLANGIOGRAM;  Surgeon: Adin Hector, MD;  Location: [facility];  Service: General;  Laterality: N/A;   KYPHOPLASTY N/A 05/30/2021   Procedure: THORACIC FIVE KYPHOPLASTY;  Surgeon: Ashok Pall, MD;  Location: [place];  Service: Neurosurgery;  Laterality: N/A;  THORACIC FIVE KYPHOPLASTY   [place]   Patient Active Problem  List   Diagnosis Date Noted   Statin myopathy 02/22/2020   Acute respiratory failure with hypoxia ([place]) 07/03/2019   Acute respiratory failure due to COVID-19 ([place]) 07/03/2019   Migraine with aura and without status migrainosus, not intractable 09/02/2016   Diabetic polyneuropathy associated with type 2 diabetes mellitus ([place]) 09/02/2016   Rheumatoid arthritis ([place]) 09/02/2016   Coronary vasospasm ([place]) 04/06/2014   Gallstones 03/10/2012   Abdominal pain 03/09/2012   MI, old    Coronary artery disease    Hypertension    Dyslipidemia     REFERRING DIAG: Referral diagnosis: Wedge compression fracture of T5-T6 vertebra, initial encounter for closed fracture [S22.050A]  THERAPY DIAG:  Unsteadiness on feet  Other abnormalities of gait and mobility  Muscle weakness  PERTINENT HISTORY: Hx of minor HA, CAD, rheumatoid arthritis (somewhat controlled with medication), TII DM  PRECAUTIONS/RESTRICTIONS:   PRECAUTIONS: Other: RA, FALL RISK, kyphoplasty 12/21   WEIGHT BEARING RESTRICTIONS No  SUBJECTIVE:  Pt reports that he is doing pretty well considering it is raining.  He is getting an injection in his knees tomorrow.  Pain:  Are you having pain? Yes Pain location:  Generalized rheumatoid arthritis pain in hands, wrists, knees NPRS scale: current 3/10, L knee  Aggravating factors: no specific Relieving factors: no specific Pain description: constant Severity: high Irritability: low Stage: Chronic   OBJECTIVE:  There were no vitals filed for this visit.  There is no height or weight on file to calculate BMI.   LE MMT: Seated MMT grossly 3+/5 (on eval)   MMT Right 06/28/2021 Left 06/28/2021  Hip flexion (L2, L3)      Knee extension (L3)      Knee flexion      Hip abduction      Hip extension      Hip external rotation      Hip internal rotation      Hip adduction      Ankle dorsiflexion (L4)      Ankle plantarflexion (S1)      Ankle inversion      Ankle eversion       Great Toe ext (L5)        (Blank rows = not tested, score listed is out of 5 possible points.  N = WNL, D = diminished, C = clear for gross weakness with myotome testing, * = concordant pain with testing)     LE ROM:   ROM Right 06/28/2021 Left 06/28/2021  Hip flexion      Hip extension      Hip abduction      Hip adduction      Hip internal rotation      Hip external rotation      Knee flexion      Knee extension      Ankle dorsiflexion      Ankle plantarflexion      Ankle inversion      Ankle eversion         (Blank rows = not tested)     FUNCTIONAL TESTS:  Progressive balance screen:   Feet together: 10'' Semi Tandem: R in rear 5'', L in rear unable'   10 m max gait speed: 18'', .56 m/s, AD: Y quad cane   30'' STS: 4x    UE used? Y   2 MWT: 180'    ASTERISK SIGNS   Asterisk Signs 2/23 2/28      Balance  ~15'' semi tandem (50%) on foam Semi tandem (100%) >25'' ea      2 MWT        Gait speed                           TREATMENT 08/09/2021:  Therapeutic Exercise: nu-step L5 36m (LE only) 2x5 STS from blue chair with airex pad (NT) Row 3x10 13# Shoulder ext 13# 3x10 STS 5x blue chair + airex Knee flexion 3x10 55# Knee ext 3x10 15# Leg press 2x10 35# Step ups - 4'' fwd 8x ea with cane Ambulating 180' CGA  Neuromuscular re-ed: (not today) - semi tandem stance 100% - 30'' bouts  HOME EXERCISE PROGRAM: Access Code: [email or web address] URL: [email or web address] Date: 06/28/2021 Prepared by: Shearon Balo   Exercises Seated Hip Abduction with Resistance - 2 x daily - 7 x weekly - 3 sets - 10 reps Seated Knee Lifts with Resistance - 2 x daily - 7 x weekly - 3 sets - 10 reps Seated Hip Adduction Isometrics with Ball - 2 x daily - 7 x weekly - 1 sets - 10 reps - 10 hold Seated Long Arc Quad - 1  x daily - 7 x weekly - 3 sets - 10 reps     ASSESSMENT:   CLINICAL IMPRESSION: William Hunter is progressing well with therapy.  Pt reports no  increase in baseline pain following therapy.  Today we concentrated on lower extremity strengthening, quad strengthening, and increasing activity tolerance.  Pt with some dizziness with step ups today requiring CGA to Min A with brief seated rest breaks.  We concentrated more on isolated strengthening today with leg press and knee ext machine in order to improve general LE to improve functional mobility.  Pt will continue to benefit from skilled physical therapy to address remaining deficits and achieve listed goals.  Continue per POC.   GOALS:   SHORT TERM GOALS:   STG Name Target Date Goal status  1 William Hunter will be >75% HEP compliant to improve carryover between sessions and facilitate independent management of condition   Baseline: No HEP  2/2: somewhat compliant 07/19/2021 Ongoing    LONG TERM GOALS:    LTG Name Target Date Goal status  1 William Hunter will improve 30'' STS (MCID 2) to >/= 8x (w/ UE?: Y) to show improved LE strength and improved transfers    Baseline: 4x  w/ UE? Y 08/23/2021 INITIAL  2 William Hunter will improve 2 MWT to > 240 ft to show improved endurance and activity [place]el[place]   Baseline: 180 ft 08/23/2021 INITIAL  3 William Hunter will improve 10 meter max gait speed to .8 m/s (.1 m/s MCID) to show functional improvement in ambulation    Eval: .56 m/s   Seen norms below:    08/23/2021 INITIAL  4 William Hunter will report a >/= 60% improvement in ease of completion of ADLs   Baseline: limited 08/23/2021 INITIAL  5 William Hunter will be able to stand for >30'' in semi-tandem stance, to show a significant improvement in balance in order to reduce fall risk    Baseline: 5'' with R in rear, unable with L 08/23/2021 INITIAL    PLAN: PT FREQUENCY: 1-2x/week   PT DURATION: 8 weeks (Ending 08/23/2021)   PLANNED INTERVENTIONS: Therapeutic exercises, Therapeutic activity, Neuro Muscular re-education, Gait training, Patient/Family education, Joint mobilization, Dry Needling, Electrical stimulation, Spinal  mobilization and/or manipulation, Moist heat, Taping, Vasopneumatic device, Ionotophoresis 4mg /ml Dexamethasone, and Manual therapy   PLAN FOR NEXT SESSION: progressive standing activity tolerance and global strengthening    Kevan Ny Lorien Shingler PT 08/09/2021, 1:46 PM

## 2021-08-10 DIAGNOSIS — M170 Bilateral primary osteoarthritis of knee: Secondary | ICD-10-CM | POA: Diagnosis not present

## 2021-08-14 ENCOUNTER — Ambulatory Visit: Payer: Medicare HMO | Admitting: Physical Therapy

## 2021-08-16 ENCOUNTER — Ambulatory Visit: Payer: Medicare HMO | Admitting: Physical Therapy

## 2021-08-18 ENCOUNTER — Other Ambulatory Visit: Payer: Medicare HMO

## 2021-08-21 ENCOUNTER — Ambulatory Visit: Payer: Medicare HMO | Admitting: Physical Therapy

## 2021-08-23 ENCOUNTER — Ambulatory Visit: Payer: Medicare HMO | Admitting: Physical Therapy

## 2021-08-28 DIAGNOSIS — Z006 Encounter for examination for normal comparison and control in clinical research program: Secondary | ICD-10-CM

## 2021-08-28 MED ORDER — STUDY - ORION 4 - INCLISIRAN 300 MG/1.5 ML OR PLACEBO SQ INJECTION (PI-STUCKEY): 300.0000 mg | INJECTION | SUBCUTANEOUS | Status: DC

## 2021-08-30 DIAGNOSIS — M25561 Pain in right knee: Secondary | ICD-10-CM | POA: Diagnosis not present

## 2021-08-30 DIAGNOSIS — G8929 Other chronic pain: Secondary | ICD-10-CM | POA: Diagnosis not present

## 2021-08-30 DIAGNOSIS — M25562 Pain in left knee: Secondary | ICD-10-CM | POA: Diagnosis not present

## 2021-08-30 DIAGNOSIS — M170 Bilateral primary osteoarthritis of knee: Secondary | ICD-10-CM | POA: Diagnosis not present

## 2021-08-31 ENCOUNTER — Other Ambulatory Visit: Payer: Medicare HMO

## 2021-09-06 DIAGNOSIS — Z006 Encounter for examination for normal comparison and control in clinical research program: Secondary | ICD-10-CM

## 2021-09-06 MED ORDER — STUDY - ORION 4 - INCLISIRAN 300 MG/1.5 ML OR PLACEBO SQ INJECTION (PI-STUCKEY): 300.0000 mg | INJECTION | SUBCUTANEOUS | 1 refills | Status: DC

## 2021-09-13 DIAGNOSIS — M0609 Rheumatoid arthritis without rheumatoid factor, multiple sites: Secondary | ICD-10-CM | POA: Diagnosis not present

## 2021-09-13 DIAGNOSIS — M170 Bilateral primary osteoarthritis of knee: Secondary | ICD-10-CM | POA: Diagnosis not present

## 2021-09-13 DIAGNOSIS — E1142 Type 2 diabetes mellitus with diabetic polyneuropathy: Secondary | ICD-10-CM | POA: Diagnosis not present

## 2021-10-01 DIAGNOSIS — M170 Bilateral primary osteoarthritis of knee: Secondary | ICD-10-CM | POA: Diagnosis not present

## 2021-10-01 DIAGNOSIS — M0609 Rheumatoid arthritis without rheumatoid factor, multiple sites: Secondary | ICD-10-CM | POA: Diagnosis not present

## 2021-10-01 DIAGNOSIS — M160 Bilateral primary osteoarthritis of hip: Secondary | ICD-10-CM | POA: Diagnosis not present

## 2021-10-01 DIAGNOSIS — M545 Low back pain, unspecified: Secondary | ICD-10-CM | POA: Diagnosis not present

## 2021-10-01 DIAGNOSIS — M25551 Pain in right hip: Secondary | ICD-10-CM | POA: Diagnosis not present

## 2021-10-01 DIAGNOSIS — Z79899 Other long term (current) drug therapy: Secondary | ICD-10-CM | POA: Diagnosis not present

## 2021-10-22 ENCOUNTER — Other Ambulatory Visit: Payer: Self-pay | Admitting: *Deleted

## 2021-10-22 MED ORDER — DILTIAZEM HCL ER BEADS 300 MG PO CP24: 300.0000 mg | ORAL_CAPSULE | Freq: Every day | ORAL | 0 refills | Status: DC

## 2021-11-23 ENCOUNTER — Other Ambulatory Visit: Payer: Self-pay

## 2021-11-23 MED ORDER — DILTIAZEM HCL ER BEADS 300 MG PO CP24: 300.0000 mg | ORAL_CAPSULE | Freq: Every day | ORAL | 0 refills | Status: DC

## 2021-11-24 ENCOUNTER — Emergency Department (HOSPITAL_COMMUNITY): Payer: Medicare HMO

## 2021-11-24 ENCOUNTER — Inpatient Hospital Stay (HOSPITAL_COMMUNITY)
Admission: EM | Admit: 2021-11-24 | Discharge: 2021-11-29 | DRG: 555 | Disposition: A | Discharge status: Skilled Nursing Facility | Payer: Medicare HMO | Attending: Internal Medicine | Admitting: Internal Medicine

## 2021-11-24 DIAGNOSIS — I5032 Chronic diastolic (congestive) heart failure: Secondary | ICD-10-CM | POA: Diagnosis not present

## 2021-11-24 DIAGNOSIS — I82412 Acute embolism and thrombosis of left femoral vein: Secondary | ICD-10-CM | POA: Diagnosis not present

## 2021-11-24 DIAGNOSIS — M069 Rheumatoid arthritis, unspecified: Secondary | ICD-10-CM | POA: Diagnosis not present

## 2021-11-24 DIAGNOSIS — T83490A Other mechanical complication of penile (implanted) prosthesis, initial encounter: Secondary | ICD-10-CM | POA: Diagnosis not present

## 2021-11-24 DIAGNOSIS — R270 Ataxia, unspecified: Secondary | ICD-10-CM | POA: Diagnosis not present

## 2021-11-24 DIAGNOSIS — E1142 Type 2 diabetes mellitus with diabetic polyneuropathy: Secondary | ICD-10-CM | POA: Diagnosis present

## 2021-11-24 DIAGNOSIS — R634 Abnormal weight loss: Secondary | ICD-10-CM | POA: Diagnosis not present

## 2021-11-24 DIAGNOSIS — N281 Cyst of kidney, acquired: Secondary | ICD-10-CM | POA: Diagnosis present

## 2021-11-24 DIAGNOSIS — M0600 Rheumatoid arthritis without rheumatoid factor, unspecified site: Secondary | ICD-10-CM | POA: Diagnosis present

## 2021-11-24 DIAGNOSIS — N289 Disorder of kidney and ureter, unspecified: Secondary | ICD-10-CM | POA: Diagnosis not present

## 2021-11-24 DIAGNOSIS — G9389 Other specified disorders of brain: Secondary | ICD-10-CM | POA: Diagnosis present

## 2021-11-24 DIAGNOSIS — M353 Polymyalgia rheumatica: Secondary | ICD-10-CM | POA: Diagnosis present

## 2021-11-24 DIAGNOSIS — M47814 Spondylosis without myelopathy or radiculopathy, thoracic region: Secondary | ICD-10-CM | POA: Diagnosis present

## 2021-11-24 DIAGNOSIS — N2889 Other specified disorders of kidney and ureter: Secondary | ICD-10-CM | POA: Diagnosis not present

## 2021-11-24 DIAGNOSIS — I10 Essential (primary) hypertension: Secondary | ICD-10-CM | POA: Diagnosis not present

## 2021-11-24 DIAGNOSIS — E1165 Type 2 diabetes mellitus with hyperglycemia: Secondary | ICD-10-CM | POA: Diagnosis not present

## 2021-11-24 DIAGNOSIS — I2510 Atherosclerotic heart disease of native coronary artery without angina pectoris: Secondary | ICD-10-CM | POA: Diagnosis present

## 2021-11-24 DIAGNOSIS — E785 Hyperlipidemia, unspecified: Secondary | ICD-10-CM | POA: Diagnosis not present

## 2021-11-24 DIAGNOSIS — Z794 Long term (current) use of insulin: Secondary | ICD-10-CM

## 2021-11-24 DIAGNOSIS — Z6824 Body mass index (BMI) 24.0-24.9, adult: Secondary | ICD-10-CM

## 2021-11-24 DIAGNOSIS — M170 Bilateral primary osteoarthritis of knee: Secondary | ICD-10-CM | POA: Diagnosis present

## 2021-11-24 DIAGNOSIS — M1990 Unspecified osteoarthritis, unspecified site: Secondary | ICD-10-CM | POA: Diagnosis not present

## 2021-11-24 DIAGNOSIS — I110 Hypertensive heart disease with heart failure: Secondary | ICD-10-CM | POA: Diagnosis present

## 2021-11-24 DIAGNOSIS — R531 Weakness: Secondary | ICD-10-CM | POA: Diagnosis not present

## 2021-11-24 DIAGNOSIS — Z888 Allergy status to other drugs, medicaments and biological substances status: Secondary | ICD-10-CM

## 2021-11-24 DIAGNOSIS — D72829 Elevated white blood cell count, unspecified: Secondary | ICD-10-CM | POA: Diagnosis present

## 2021-11-24 DIAGNOSIS — S22080A Wedge compression fracture of T11-T12 vertebra, initial encounter for closed fracture: Secondary | ICD-10-CM | POA: Diagnosis not present

## 2021-11-24 DIAGNOSIS — M6281 Muscle weakness (generalized): Principal | ICD-10-CM | POA: Diagnosis present

## 2021-11-24 DIAGNOSIS — I82452 Acute embolism and thrombosis of left peroneal vein: Secondary | ICD-10-CM | POA: Diagnosis present

## 2021-11-24 DIAGNOSIS — Z91138 Patient's unintentional underdosing of medication regimen for other reason: Secondary | ICD-10-CM

## 2021-11-24 DIAGNOSIS — Z993 Dependence on wheelchair: Secondary | ICD-10-CM

## 2021-11-24 DIAGNOSIS — I499 Cardiac arrhythmia, unspecified: Secondary | ICD-10-CM | POA: Diagnosis not present

## 2021-11-24 DIAGNOSIS — J9811 Atelectasis: Secondary | ICD-10-CM | POA: Diagnosis not present

## 2021-11-24 DIAGNOSIS — Z808 Family history of malignant neoplasm of other organs or systems: Secondary | ICD-10-CM

## 2021-11-24 DIAGNOSIS — Z86711 Personal history of pulmonary embolism: Secondary | ICD-10-CM

## 2021-11-24 DIAGNOSIS — E669 Obesity, unspecified: Secondary | ICD-10-CM | POA: Diagnosis present

## 2021-11-24 DIAGNOSIS — I82532 Chronic embolism and thrombosis of left popliteal vein: Secondary | ICD-10-CM | POA: Diagnosis present

## 2021-11-24 DIAGNOSIS — Z9689 Presence of other specified functional implants: Secondary | ICD-10-CM | POA: Diagnosis present

## 2021-11-24 DIAGNOSIS — R29898 Other symptoms and signs involving the musculoskeletal system: Secondary | ICD-10-CM | POA: Diagnosis present

## 2021-11-24 DIAGNOSIS — M5124 Other intervertebral disc displacement, thoracic region: Secondary | ICD-10-CM | POA: Diagnosis not present

## 2021-11-24 DIAGNOSIS — M6250 Muscle wasting and atrophy, not elsewhere classified, unspecified site: Secondary | ICD-10-CM | POA: Diagnosis not present

## 2021-11-24 DIAGNOSIS — Z8616 Personal history of COVID-19: Secondary | ICD-10-CM

## 2021-11-24 DIAGNOSIS — M47812 Spondylosis without myelopathy or radiculopathy, cervical region: Secondary | ICD-10-CM | POA: Diagnosis present

## 2021-11-24 DIAGNOSIS — M4807 Spinal stenosis, lumbosacral region: Secondary | ICD-10-CM | POA: Diagnosis not present

## 2021-11-24 DIAGNOSIS — M6259 Muscle wasting and atrophy, not elsewhere classified, multiple sites: Secondary | ICD-10-CM | POA: Diagnosis not present

## 2021-11-24 DIAGNOSIS — K5900 Constipation, unspecified: Secondary | ICD-10-CM | POA: Diagnosis not present

## 2021-11-24 DIAGNOSIS — Z7401 Bed confinement status: Secondary | ICD-10-CM

## 2021-11-24 DIAGNOSIS — Z79899 Other long term (current) drug therapy: Secondary | ICD-10-CM

## 2021-11-24 DIAGNOSIS — M5126 Other intervertebral disc displacement, lumbar region: Secondary | ICD-10-CM | POA: Diagnosis not present

## 2021-11-24 DIAGNOSIS — R9431 Abnormal electrocardiogram [ECG] [EKG]: Secondary | ICD-10-CM | POA: Diagnosis not present

## 2021-11-24 DIAGNOSIS — Z880 Allergy status to penicillin: Secondary | ICD-10-CM

## 2021-11-24 DIAGNOSIS — G8929 Other chronic pain: Secondary | ICD-10-CM | POA: Diagnosis present

## 2021-11-24 DIAGNOSIS — T461X6A Underdosing of calcium-channel blockers, initial encounter: Secondary | ICD-10-CM | POA: Diagnosis present

## 2021-11-24 DIAGNOSIS — M2578 Osteophyte, vertebrae: Secondary | ICD-10-CM | POA: Diagnosis not present

## 2021-11-24 DIAGNOSIS — Z9049 Acquired absence of other specified parts of digestive tract: Secondary | ICD-10-CM

## 2021-11-24 DIAGNOSIS — I2699 Other pulmonary embolism without acute cor pulmonale: Secondary | ICD-10-CM | POA: Diagnosis not present

## 2021-11-24 DIAGNOSIS — M40204 Unspecified kyphosis, thoracic region: Secondary | ICD-10-CM | POA: Diagnosis not present

## 2021-11-24 DIAGNOSIS — Z885 Allergy status to narcotic agent status: Secondary | ICD-10-CM

## 2021-11-24 DIAGNOSIS — I82402 Acute embolism and thrombosis of unspecified deep veins of left lower extremity: Secondary | ICD-10-CM | POA: Diagnosis not present

## 2021-11-24 DIAGNOSIS — M47816 Spondylosis without myelopathy or radiculopathy, lumbar region: Secondary | ICD-10-CM | POA: Diagnosis present

## 2021-11-24 DIAGNOSIS — I5022 Chronic systolic (congestive) heart failure: Secondary | ICD-10-CM | POA: Diagnosis not present

## 2021-11-24 DIAGNOSIS — Z79620 Long term (current) use of immunosuppressive biologic: Secondary | ICD-10-CM

## 2021-11-24 DIAGNOSIS — G459 Transient cerebral ischemic attack, unspecified: Secondary | ICD-10-CM | POA: Diagnosis not present

## 2021-11-24 DIAGNOSIS — I252 Old myocardial infarction: Secondary | ICD-10-CM

## 2021-11-24 DIAGNOSIS — R739 Hyperglycemia, unspecified: Secondary | ICD-10-CM | POA: Diagnosis not present

## 2021-11-24 DIAGNOSIS — Z8249 Family history of ischemic heart disease and other diseases of the circulatory system: Secondary | ICD-10-CM

## 2021-11-24 DIAGNOSIS — Z86718 Personal history of other venous thrombosis and embolism: Secondary | ICD-10-CM | POA: Diagnosis not present

## 2021-11-24 DIAGNOSIS — E118 Type 2 diabetes mellitus with unspecified complications: Secondary | ICD-10-CM | POA: Diagnosis not present

## 2021-11-24 LAB — I-STAT VENOUS BLOOD GAS, ED
Acid-Base Excess: 1 mmol/L (ref 0.0–2.0)
Bicarbonate: 24.4 mmol/L (ref 20.0–28.0)
Calcium, Ion: 1.06 mmol/L — ABNORMAL LOW (ref 1.15–1.40)
HCT: 43 % (ref 39.0–52.0)
Hemoglobin: 14.6 g/dL (ref 13.0–17.0)
O2 Saturation: 100 %
Potassium: 3.8 mmol/L (ref 3.5–5.1)
Sodium: 136 mmol/L (ref 135–145)
TCO2: 25 mmol/L (ref 22–32)
pCO2, Ven: 33.9 mmHg — ABNORMAL LOW (ref 44–60)
pH, Ven: 7.466 — ABNORMAL HIGH (ref 7.25–7.43)
pO2, Ven: 193 mmHg — ABNORMAL HIGH (ref 32–45)

## 2021-11-24 LAB — URINALYSIS, ROUTINE W REFLEX MICROSCOPIC
Bacteria, UA: NONE SEEN
Bilirubin Urine: NEGATIVE
Glucose, UA: >=500 mg/dL — AB
Hgb urine dipstick: NEGATIVE
Ketones, ur: 20 mg/dL — AB
Leukocytes,Ua: NEGATIVE
Nitrite: NEGATIVE
Protein, ur: NEGATIVE mg/dL
Specific Gravity, Urine: 1.032 — ABNORMAL HIGH (ref 1.005–1.030)
pH: 5 (ref 5.0–8.0)

## 2021-11-24 LAB — COMPREHENSIVE METABOLIC PANEL
ALT: 15 U/L (ref 0–44)
AST: 12 U/L — ABNORMAL LOW (ref 15–41)
Albumin: 3.2 g/dL — ABNORMAL LOW (ref 3.5–5.0)
Alkaline Phosphatase: 68 U/L (ref 38–126)
Anion gap: 12 (ref 5–15)
BUN: 23 mg/dL (ref 8–23)
CO2: 21 mmol/L — ABNORMAL LOW (ref 22–32)
Calcium: 8.9 mg/dL (ref 8.9–10.3)
Chloride: 104 mmol/L (ref 98–111)
Creatinine, Ser: 0.68 mg/dL (ref 0.61–1.24)
GFR, Estimated: >60 mL/min (ref >60)
Glucose, Bld: 304 mg/dL — ABNORMAL HIGH (ref 70–99)
Potassium: 3.9 mmol/L (ref 3.5–5.1)
Sodium: 137 mmol/L (ref 135–145)
Total Bilirubin: 0.4 mg/dL (ref 0.3–1.2)
Total Protein: 6.5 g/dL (ref 6.5–8.1)

## 2021-11-24 LAB — CBC WITH DIFFERENTIAL/PLATELET
Abs Immature Granulocytes: 0.05 10*3/uL (ref 0.00–0.07)
Basophils Absolute: 0.1 10*3/uL (ref 0.0–0.1)
Basophils Relative: 1 %
Eosinophils Absolute: 0.1 10*3/uL (ref 0.0–0.5)
Eosinophils Relative: 1 %
HCT: 43.6 % (ref 39.0–52.0)
Hemoglobin: 14.7 g/dL (ref 13.0–17.0)
Immature Granulocytes: 1 %
Lymphocytes Relative: 16 %
Lymphs Abs: 1.7 10*3/uL (ref 0.7–4.0)
MCH: 31.5 pg (ref 26.0–34.0)
MCHC: 33.7 g/dL (ref 30.0–36.0)
MCV: 93.6 fL (ref 80.0–100.0)
Monocytes Absolute: 0.7 10*3/uL (ref 0.1–1.0)
Monocytes Relative: 7 %
Neutro Abs: 8.3 10*3/uL — ABNORMAL HIGH (ref 1.7–7.7)
Neutrophils Relative %: 74 %
Platelets: 263 10*3/uL (ref 150–400)
RBC: 4.66 MIL/uL (ref 4.22–5.81)
RDW: 14.4 % (ref 11.5–15.5)
WBC: 11 10*3/uL — ABNORMAL HIGH (ref 4.0–10.5)
nRBC: 0 % (ref 0.0–0.2)

## 2021-11-24 LAB — CBG MONITORING, ED: Glucose-Capillary: 254 mg/dL — ABNORMAL HIGH (ref 70–99)

## 2021-11-24 LAB — LIPASE, BLOOD: Lipase: 41 U/L (ref 11–51)

## 2021-11-24 LAB — TROPONIN I (HIGH SENSITIVITY): Troponin I (High Sensitivity): 6 ng/L (ref <18)

## 2021-11-24 LAB — MAGNESIUM: Magnesium: 1.7 mg/dL (ref 1.7–2.4)

## 2021-11-24 IMAGING — CT CT HEAD W/O CM
3 series · 15 of 47 positions shown, 18 images · non-contrast
Comparison: [DATE]

CLINICAL DATA: Transient ischemic attack



[Series 4: head 5.0 h30s · axial · 0.49mm/px · z∈[-118,+17]mm · 9 of 33 slices shown, 12 images]
[im 3/33  brain]
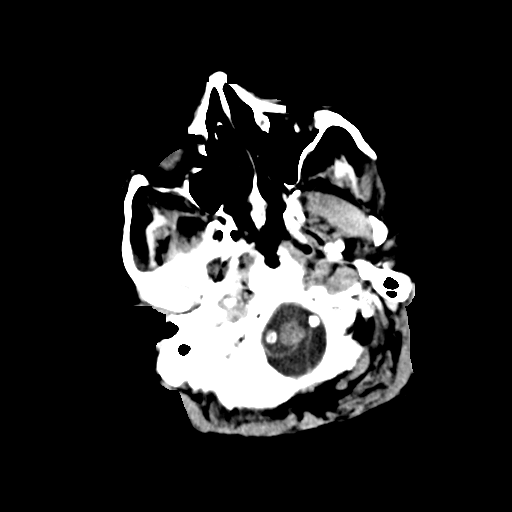
[im 3/33  bone]
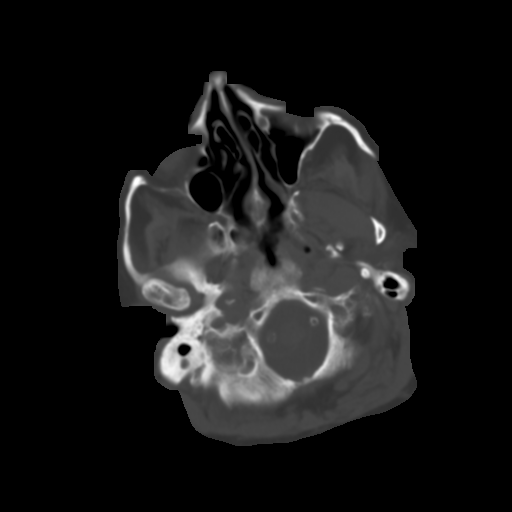
[im 6/33  brain]
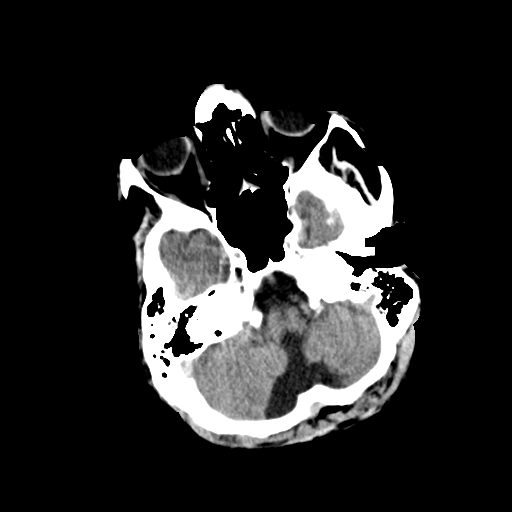
[im 9/33  brain]
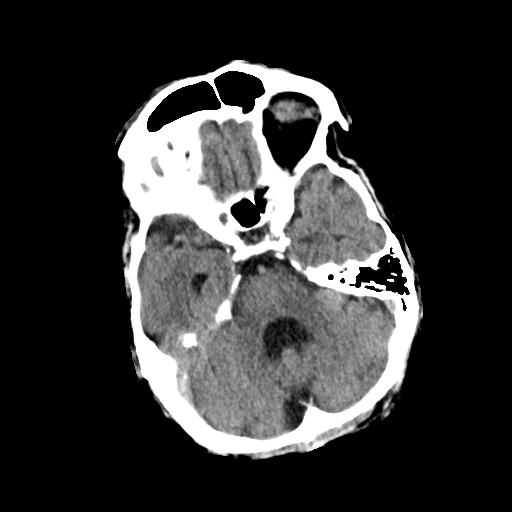
[im 13/33  brain]
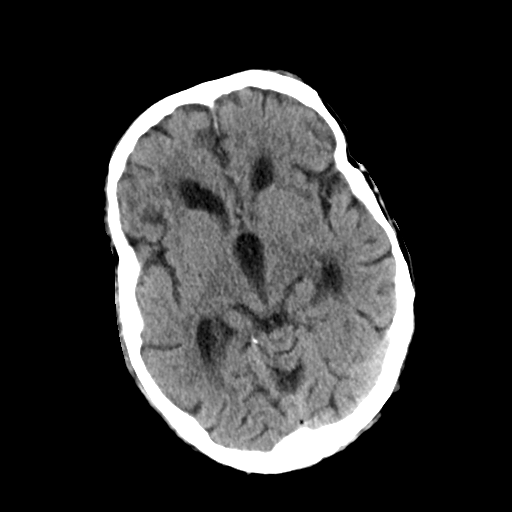
[im 17/33  brain]
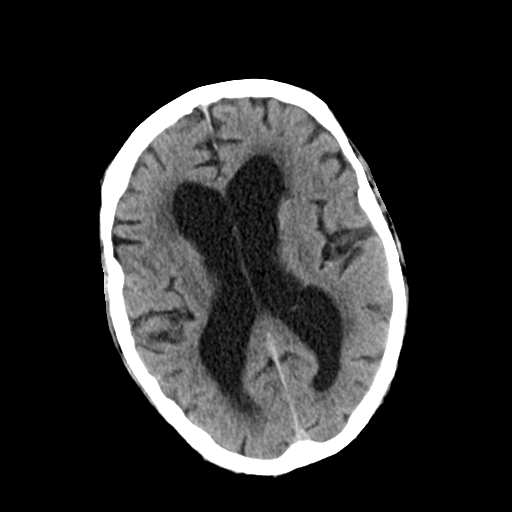
[im 17/33  bone]
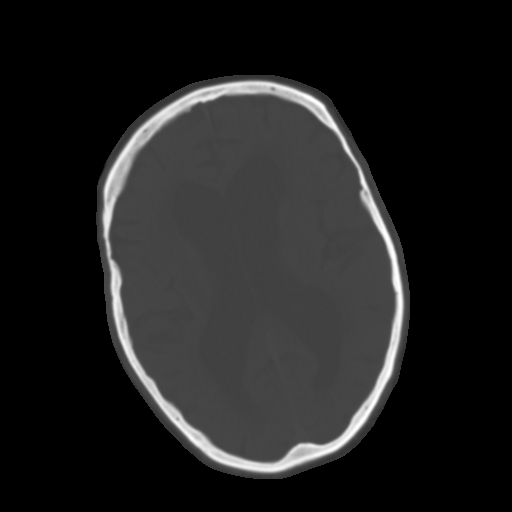
[im 20/33  brain]
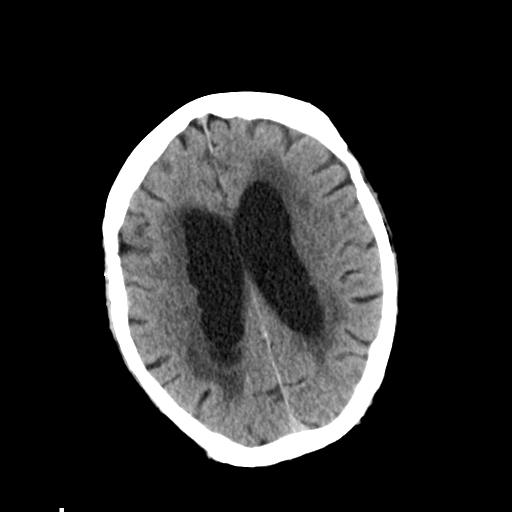
[im 24/33  brain]
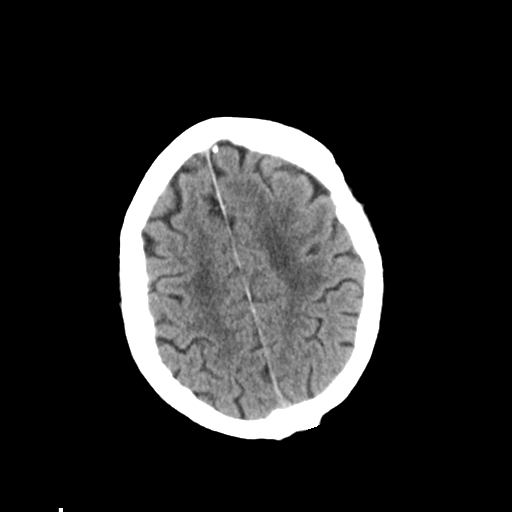
[im 27/33  brain]
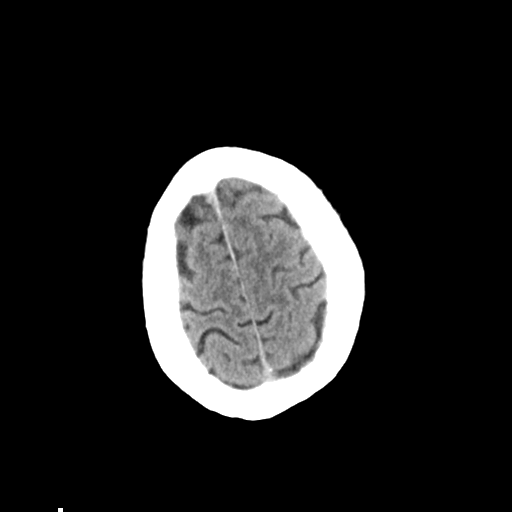
[im 30/33  brain]
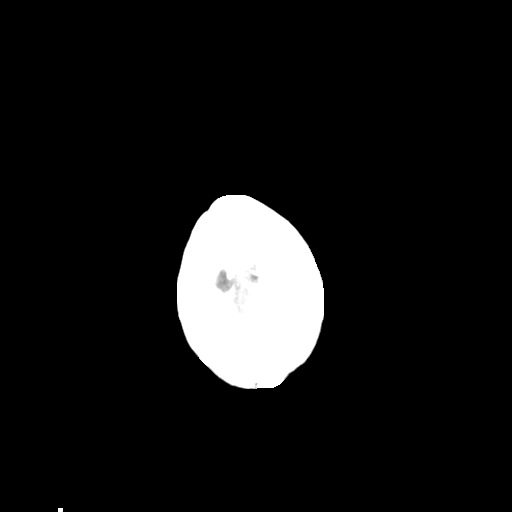
[im 30/33  bone]
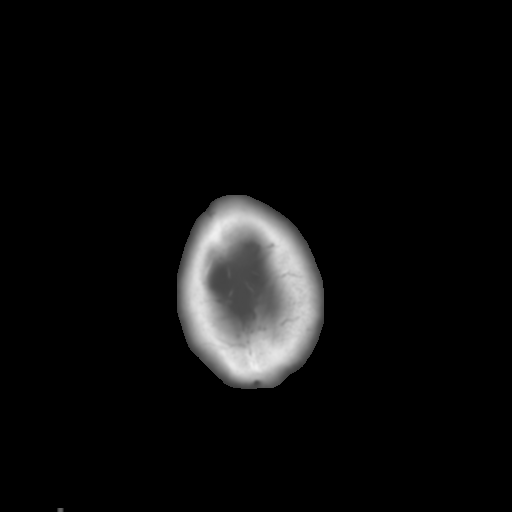

[Series 5: head 3.0 mpr cor · coronal · 0.34mm/px · 3 of 71 slices shown]
[im 24/71  brain]
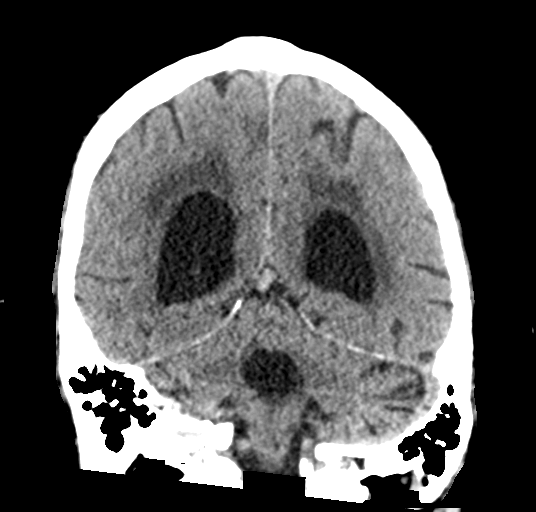
[im 32/71  brain]
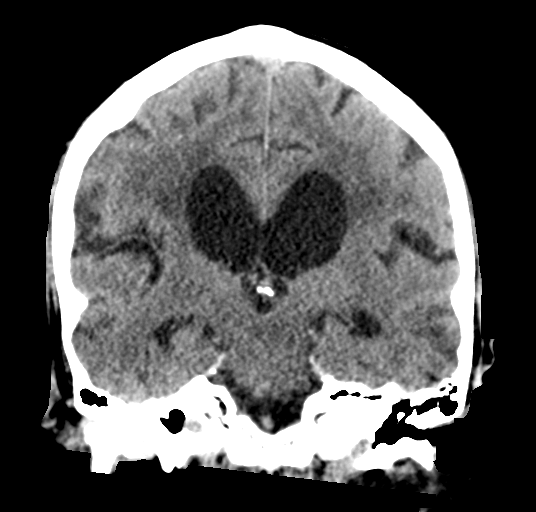
[im 39/71  brain]
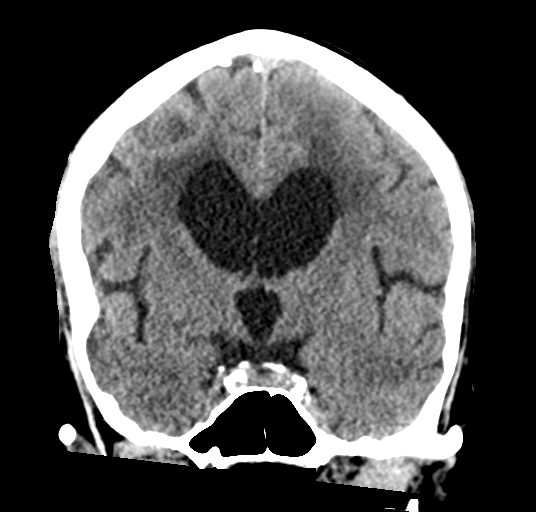

[Series 6: head 3.0 mpr sag · sagittal · 0.33mm/px · 3 of 56 slices shown]
[im 19/56  brain]
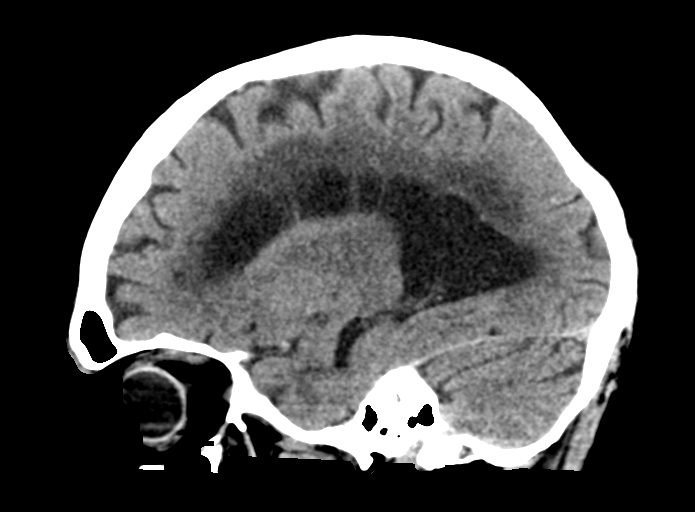
[im 28/56  brain]
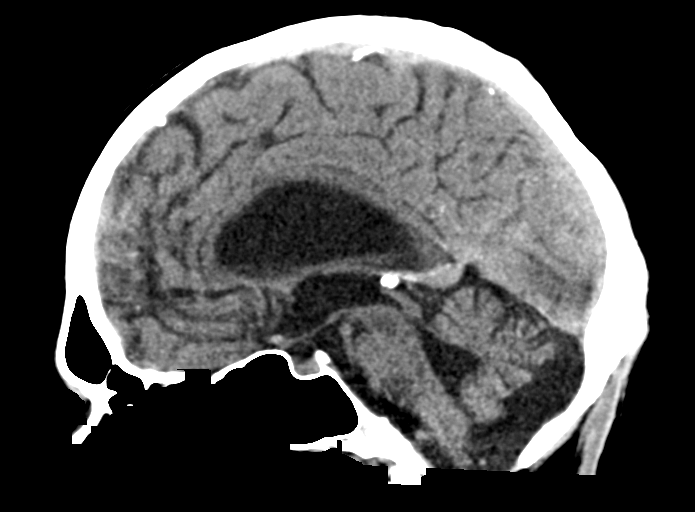
[im 37/56  brain]
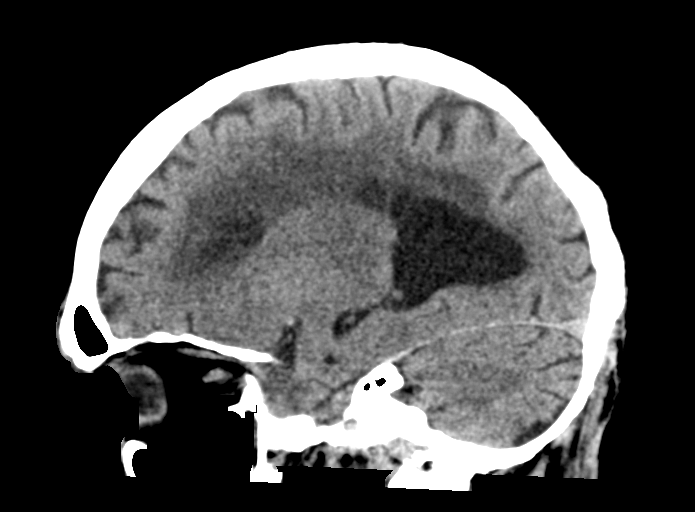

[15 of 47 positions shown; findings below may reference images not displayed]

FINDINGS: Brain: Normal anatomic configuration. Parenchymal volume loss is
commensurate with the patient's age. Moderate periventricular white
matter changes are present likely reflecting the sequela of small
vessel ischemia. No abnormal intra or extra-axial mass lesion or
fluid collection. No abnormal mass effect or midline shift. No
evidence of acute intracranial hemorrhage or infarct. Mild
ventriculomegaly appears stable since prior examination and is
slightly disproportionate to the degree of parenchymal volume loss
suggesting either asymmetric central atrophy or communicating
hydrocephalus. Cerebellum unremarkable.

Vascular: No asymmetric hyperdense vasculature at the skull base.

Skull: Intact

Sinuses/Orbits: Paranasal sinuses are clear. Ocular lenses have been
removed. Orbits are otherwise unremarkable.

Other: Mastoid air cells and middle ear cavities are clear.
IMPRESSION: 1. No acute intracranial hemorrhage or infarct.
2. Stable ventriculomegaly, slightly disproportionate to the degree
of parenchymal volume loss suggesting either asymmetric central
atrophy or communicating hydrocephalus.
3. Stable periventricular white matter changes, likely reflecting
the sequela of small vessel ischemia.

## 2021-11-24 IMAGING — CR DG PELVIS 1-2V
1 series · 1 of 1 positions shown · non-contrast
Comparison: [DATE]

CLINICAL DATA: Weakness, fall

EXAM:
PELVIS - 1-2 VIEW

[pelvis ap]
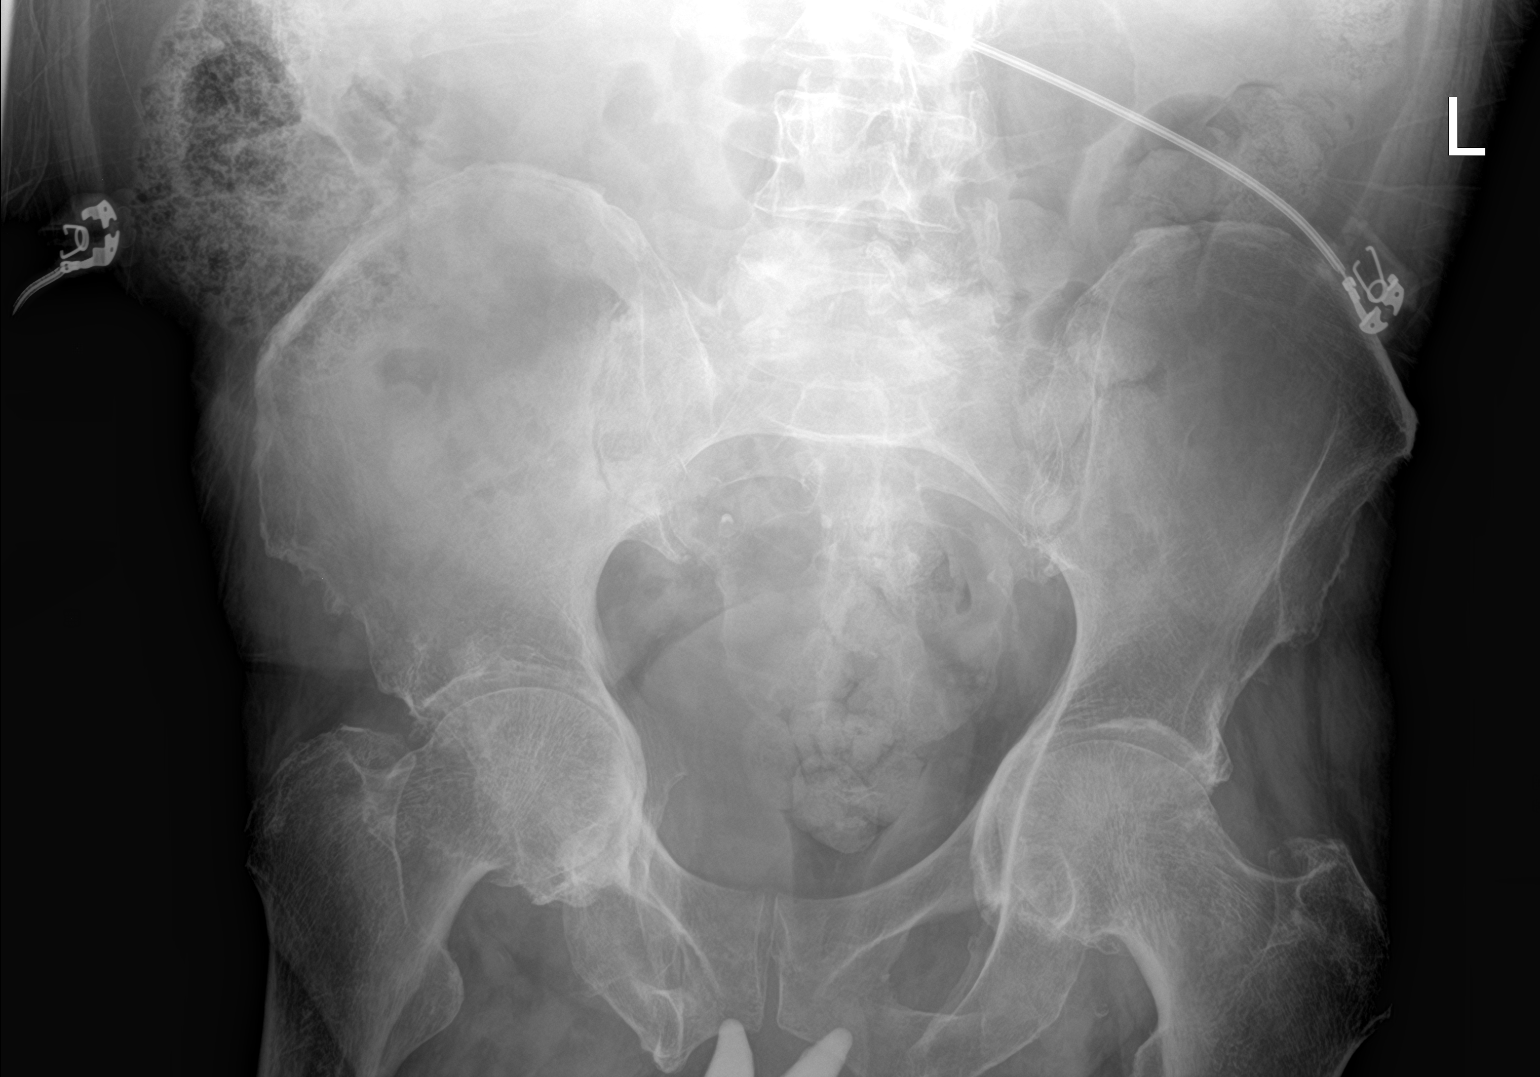

[1 of 1 positions shown; findings below may reference images not displayed]

FINDINGS: There is no evidence of pelvic fracture or diastasis. Advanced
arthropathy of the bilateral hips with acetabular protrusio
deformities. Bones are demineralized.
IMPRESSION: Negative.

## 2021-11-24 MED ORDER — SODIUM CHLORIDE 0.9 % IV BOLUS
1000.0000 mL | Freq: Once | INTRAVENOUS | Status: AC
  Administered 2021-11-24: 1000 mL via INTRAVENOUS

## 2021-11-24 MED ORDER — INSULIN ASPART 100 UNIT/ML IJ SOLN
5.0000 [IU] | Freq: Once | INTRAMUSCULAR | Status: AC
  Administered 2021-11-24: 5 [IU] via SUBCUTANEOUS

## 2021-11-24 NOTE — ED Provider Notes (Signed)
[facility] EMERGENCY DEPARTMENT Provider Note   CSN: [id number] Arrival date & time: 11/24/21  1949    History  Chief Complaint  Patient presents with   Weakness    William Hunter is a 76 y.o. male history of rheumatoid arthritis, polyneuropathy, chronic polymyalgia, diabetes, chronic pain here for evaluation of weakness over the last 3-4 months.  Sounds like he has been discussing with his rheumatologist to get home health to help with PT and ADL or placement.  Patient feels generally weak worse on right side which is chronic in nature over is having worsening lower extremity weakness over the last week..  Was previously using a cane however now using walker/wheelchair.  States he is essentially bedbound. Fall about 3 weeks ago, denies hitting his head.  On chronic Humira for his RA. Fall 3 weeks ago.  States he has been seen by rheumatology and orthopedics multiple times for similar, and they cannot figure out why he is having progressive weakness.  He has not been seen by neurology.Marland Kitchen  He ready has home health coming out per family however they are still unable to take care of him.  Family states essentially patient cannot do anything for himself. Family unable to take care of his at home. They were not able to get him out of his reclining chair today. No fever, chest pain, shortness of breath abdominal pain, back pain, numbness, dysuria or hematuria.  No recent sick contacts.  Denies where patient hurts he states "the normal places."  Family states patient earlier on this year was able to ambulate with minimal assistance however today could not even stand to pivot due to his lower extremity weakness.   Patient stated initially that he would not perform MRI however after discussing with patient he said if he had proper sedation he would attempt  HPI     Home Medications Prior to Admission medications   Medication Sig Start Date End Date Taking? Authorizing Provider   acetaminophen (TYLENOL) 500 MG tablet Take 1,000 mg by mouth every 6 (six) hours as needed for moderate pain.    [provider]  diltiazem (TIAZAC) 300 MG 24 hr capsule Take 1 capsule (300 mg total) by mouth daily. Pt. Needs to schedule overdue appt. With Dr. Anne Fu in order to receive future refills. Thank You. 2nd Attempt. 11/23/21   Jake Bathe, MD  folic acid (FOLVITE) 1 MG tablet Take 1 mg by mouth daily.    [provider]  HYDROcodone-acetaminophen (NORCO/VICODIN) 5-325 MG tablet Take 1 tablet by mouth every 6 (six) hours as needed for moderate pain.    [provider]  Insulin Glargine (BASAGLAR KWIKPEN) 100 UNIT/ML Inject 30 Units into the skin at bedtime.    [provider]  insulin lispro (HUMALOG) 100 UNIT/ML KwikPen Inject 20-35 Units into the skin with breakfast, with lunch, and with evening meal.    [provider]  loratadine (CLARITIN) 10 MG tablet Take 10 mg by mouth daily as needed for allergies.    [provider]  meclizine (ANTIVERT) 25 MG tablet Take 25 mg by mouth 2 (two) times daily. 08/01/21   [provider]  methotrexate (RHEUMATREX) 2.5 MG tablet Take 15 mg by mouth every Wednesday. Caution:Chemotherapy. Protect from light.    [provider]  Study - ORION 4 - inclisiran 300 mg/1.39mL or placebo SQ injection (PI-Stuckey) Inject 1.5 mLs (300 mg total) into the skin every 6 (six) months. 09/06/21   Stuckey,  Arturo Morton, MD      Allergies    Codeine, Penicillins, Prednisone, Wool alcohol [lanolin], and Empagliflozin    Review of Systems   Review of Systems  Constitutional: Negative.   HENT: Negative.    Respiratory: Negative.    Cardiovascular: Negative.   Gastrointestinal: Negative.   Genitourinary: Negative.   Musculoskeletal:  Positive for arthralgias, gait problem and myalgias.  Skin: Negative.   Neurological:  Positive for weakness. Negative for dizziness, tremors, seizures, syncope, facial  asymmetry, speech difficulty, light-headedness, numbness and headaches.  All other systems reviewed and are negative.   Physical Exam Updated Vital Signs BP (!) 143/81   Pulse 73   Temp 98.2 F (36.8 C) (Oral)   Resp 17   SpO2 98%  Physical Exam Vitals and nursing note reviewed.  Constitutional:      General: He is not in acute distress.    Appearance: He is well-developed. He is ill-appearing (chronically ill appearing). He is not toxic-appearing or diaphoretic.  HENT:     Head: Normocephalic and atraumatic.     Nose: Nose normal.     Mouth/Throat:     Mouth: Mucous membranes are moist.  Eyes:     Pupils: Pupils are equal, round, and reactive to light.  Cardiovascular:     Rate and Rhythm: Normal rate and regular rhythm.     Pulses: Normal pulses.     Heart sounds: Normal heart sounds.  Pulmonary:     Effort: Pulmonary effort is normal. No respiratory distress.     Breath sounds: Normal breath sounds.  Abdominal:     General: Bowel sounds are normal. There is no distension.     Palpations: Abdomen is soft.     Tenderness: There is no abdominal tenderness. There is no guarding or rebound.  Musculoskeletal:        General: No swelling, tenderness, deformity or signs of injury. Normal range of motion.     Cervical back: Normal range of motion and neck supple.     Comments: No bony tenderness, moves all 4 extremities  Skin:    General: Skin is warm and dry.     Comments: Sacral wound  Neurological:     General: No focal deficit present.     Mental Status: He is alert and oriented to person, place, and time.     Cranial Nerves: Cranial nerves 2-12 are intact.     Sensory: Sensation is intact.     Motor: Weakness present. No tremor, atrophy or abnormal muscle tone.     Comments: CN 2-12 grossly intact Global weakness 4/5 strength bil, equal hand grip bil Intact sensation Bil Unable to ambulate Neg finger to nose No drift    ED Results / Procedures / Treatments    Labs (all labs ordered are listed, but only abnormal results are displayed) Labs Reviewed  CBC WITH DIFFERENTIAL/PLATELET - Abnormal; Notable for the following components:      Result Value   WBC 11.0 (*)    Neutro Abs 8.3 (*)    All other components within normal limits  COMPREHENSIVE METABOLIC PANEL - Abnormal; Notable for the following components:   CO2 21 (*)    Glucose, Bld 304 (*)    Albumin 3.2 (*)    AST 12 (*)    All other components within normal limits  URINALYSIS, ROUTINE W REFLEX MICROSCOPIC - Abnormal; Notable for the following components:   Specific Gravity, Urine 1.032 (*)    Glucose, UA >=500 (*)  Ketones, ur 20 (*)    All other components within normal limits  I-STAT VENOUS BLOOD GAS, ED - Abnormal; Notable for the following components:   pH, Ven 7.466 (*)    pCO2, Ven 33.9 (*)    pO2, Ven 193 (*)    Calcium, Ion 1.06 (*)    All other components within normal limits  CBG MONITORING, ED - Abnormal; Notable for the following components:   Glucose-Capillary 254 (*)    All other components within normal limits  MAGNESIUM  LIPASE, BLOOD  TROPONIN I (HIGH SENSITIVITY)  TROPONIN I (HIGH SENSITIVITY)    EKG EKG Interpretation  Date/Time:  Saturday November 24 2021 21:20:40 EDT Ventricular Rate:  77 PR Interval:  184 QRS Duration: 79 QT Interval:  363 QTC Calculation: 411 R Axis:   29 Text Interpretation: Sinus rhythm RSR' in V1 or V2, right VCD or RVH No significant change since last tracing Confirmed by Linwood Dibbles [phone]) on 11/24/2021 10:49:13 PM  Radiology DG Pelvis 1-2 Views  Result Date: 11/24/2021 CLINICAL DATA:  Weakness, fall EXAM: PELVIS - 1-2 VIEW COMPARISON:  05/14/2021 FINDINGS: There is no evidence of pelvic fracture or diastasis. 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 Bones are demineralized. IMPRESSION: Negative. Electronically Signed   By: Duanne Guess D.O.   On: 11/24/2021 21:52   DG Chest 2  View  Result Date: 11/24/2021 CLINICAL DATA:  Weakness, fall EXAM: CHEST - 2 VIEW COMPARISON:  07/02/2019 FINDINGS: The heart size and mediastinal contours are within normal limits. Low lung volumes. Streaky left basilar opacity. Right lung is clear. No pleural effusion or pneumothorax. The visualized skeletal structures are unremarkable. IMPRESSION: Low lung volumes with streaky left basilar opacity, atelectasis versus infiltrate. Electronically Signed   By: Duanne Guess D.O.   On: 11/24/2021 21:51   CT HEAD WO CONTRAST ( )  Result Date: 11/24/2021 CLINICAL DATA:  Transient ischemic attack EXAM: CT HEAD WITHOUT CONTRAST TECHNIQUE: Contiguous axial images were obtained from the base of the skull through the vertex without intravenous contrast. RADIATION DOSE REDUCTION: This exam was performed according to the departmental dose-optimization program which includes automated exposure control, adjustment of the mA and/or kV according to patient size and/or use of iterative reconstruction technique. COMPARISON:  05/14/2021 FINDINGS: Brain: Normal anatomic configuration. Parenchymal volume loss is commensurate with the patient's age. Moderate periventricular white matter changes are present likely reflecting the sequela of small vessel ischemia. No abnormal intra or extra-axial mass lesion or fluid collection. No abnormal mass effect or midline shift. No evidence of acute intracranial hemorrhage or infarct. Mild ventriculomegaly appears stable since prior examination and is slightly disproportionate to the degree of parenchymal volume loss suggesting either asymmetric central atrophy or communicating hydrocephalus. Cerebellum unremarkable. Vascular: No asymmetric hyperdense vasculature at the skull base. Skull: Intact Sinuses/Orbits: Paranasal sinuses are clear. Ocular lenses have been removed. Orbits are otherwise unremarkable. Other: Mastoid air cells and middle ear cavities are clear. IMPRESSION: 1. No  acute intracranial hemorrhage or infarct. 2. Stable ventriculomegaly, slightly disproportionate to the degree of parenchymal volume loss suggesting either asymmetric central atrophy or communicating hydrocephalus. 3. Stable periventricular white matter changes, likely reflecting the sequela of small vessel ischemia. Electronically Signed   By: Helyn Numbers M.D.   On: 11/24/2021 21:14    Procedures Procedures    Medications Ordered in ED Medications  sodium chloride 0.9 % bolus 1,000 mL (0 mLs Intravenous Stopped 11/24/21 2249)  insulin aspart (novoLOG) injection 5 Units (5 Units  Subcutaneous Given 11/24/21 2249)   ED Course/ Medical Decision Making/ A&P    76 year old history of RA, chronic pain, polyneuropathy here for evaluation of weakness over the last 3 to 4 months, gradually worsening.  Patient was typically able to walk with a cane however is now using a walker or wheelchair needing assistance for typical ADLs.  Family is unable to take care of him at home.  Did have a fall about 3 weeks ago however none recently. Denies any new right-sided weakness.  He was unable to get him out of his recliner which she typically stays in daily subsequently leading him here.  Currently they have been getting PT, OT at the house.  On exam patient has global weakness however no focal findings aside from severe weakness.  We will plan on labs, imaging and reassess  Labs and imaging personally viewed and interpreted:  CT head without acute findings, does show  enlarged ventricles which are similar to prior CT imaging X-ray chest with low lung volumes, atelectasis versus early infiltrate X-ray pelvis without significant normality CBC leukocytosis 11.0 UA neg for infection  CONSULT with Dr. Otelia Limes with Neuro, see patient at bedside and make recommendations  CONSULT with Dr. Margo Aye with [facility], would defer admission until neurology recommendations.  We will need to place re-consult with [facility] if patient does  need admission.  Care transferred to [facility] who will follow up on Neuro recommendations and disposition.                           Medical Decision Making Amount and/or Complexity of Data Reviewed Independent Historian: caregiver and spouse External Data Reviewed: labs, radiology, ECG and notes. Labs: ordered. Decision-making details documented in ED Course. Radiology: ordered and independent interpretation performed. Decision-making details documented in ED Course. ECG/medicine tests: ordered and independent interpretation performed. Decision-making details documented in ED Course.  Risk OTC drugs. Prescription drug management. Parenteral controlled substances. Diagnosis or treatment significantly limited by social determinants of health.          Final Clinical Impression(s) / ED Diagnoses Final diagnoses:  Weakness  Rheumatoid arthritis, involving unspecified site, unspecified whether rheumatoid factor present [facility])    Rx / DC Orders ED Discharge Orders     None         Santosh Petter A, PA-C 11/24/21 2344    Linwood Dibbles, MD 11/25/21 1501

## 2021-11-24 NOTE — ED Provider Notes (Signed)
Physical Exam  BP (!) 143/79   Pulse 71   Temp 98.2 F (36.8 C) (Oral)   Resp 20   SpO2 98%   Physical Exam  Procedures  Procedures  ED Course / MDM   Clinical Course as of 11/25/21 0711  Sun Nov 25, 2021  1610 Repeat consult to hospitalist Dr. Margo Aye who does not feel patient will require admission but he is agreeable to consulting on the patient in the emergency department.  I appreciate her collaboration in care of this patient. [RS]  [phone] Hospitalist, Dr. Margo Aye has examined this patient and feels he does not meet admission criteria. Will discuss dispo desire with patient and family.  [RS]    Clinical Course User Index [RS] Hibba Schram, Eugene Gavia, PA-C   Medical Decision Making Amount and/or Complexity of Data Reviewed Labs: ordered. Radiology: ordered.  Risk Prescription drug management. Decision regarding hospitalization.    Care of this patient assumed from preceding ED provider, Britni Henderly, PA-C at time of shift change.   Please see her associated note for further insight into the patient's ED course.  In brief, Patient is a 76 year old male with history of RA, polyneuropathy, and chronic polymyalgia who presents with concern for progressively worsening weakness over the last 3 to 4 months having been seen in the outpatient setting multiple times by his rheumatologist and currently undergoing PT and home health evaluation.  He presents to the emergency department today for concern for progressive worsening left lower extremity weakness in addition to chronic right lower extremity weakness.  Was in the past using a cane, however in the past week has declined from being able to use a walker now he is wheelchair-bound.  Today he was unable to rise even from his recliner despite the assistance of 2 separate family members which is new for him.  He is been seen by rheum and orthopedics multiple times for same presentation without clear etiology.  CBC with mild leukocytosis  of 11,000, CMP unremarkable, UA negative for infection.  Magnesium, troponin, and lipase are normal.  CT scan of the head negative new acute intracranial abnormality the patient does have stable ventriculomegaly suggesting either symmetric central atrophy or communicating hydrocephalus.  Given stability presentation on CT doubt acute emergent etiology.  At time of shift change patient was awaiting neurology consultation and recommendations for management and disposition, as hospitalist had initially deferred admission until neurology made recommendations.  Patient evaluated by Dr. Otelia Limes, neurologist at the bedside who recommends medical admission for further workup with MRIs.  Patient will require significant sedation for MRIs given extent of his claustrophobia and MRI protocol may be complicated by history of metal penile implants.  These were placed many years ago, unclear if they are MRI compatible, however patient will require further work-up for his acutely progressive weakness rendering him incapable of standing.  Will also likely benefit from PT/OT, and ultimately likely rehabilitation placement.  I appreciate his collaboration of care of this patient.  Consult to hospitalist pending at this time.  Repeat consult to hospitalist Dr. Margo Aye who examined the patient's bedside and does not feel he would benefit from mission to the hospital.  She is refused admission at this time.  Discussion with hospitalist by myself and attending EDP Dr. Wilkie Aye regarding recommendation by prior ED attending physician and neurologist both for admission to the hospital for completion of work-up.  It is now time of shift change.  Care signed out to oncoming morning ED team, who reconsult hospital  medicine after shift change to secure admission for this patient for completion of his work-up and establishment of safe disposition planning.  Eze and his family voiced understanding of her medical evaluation and treatment  plan. Each of their questions answered to their expressed satisfaction.   This chart was dictated using voice recognition software, Dragon. Despite the best efforts of this provider to proofread and correct errors, errors may still occur which can change documentation meaning.      Paris Lore, PA-C 11/25/21 1308    Shon Baton, MD 11/26/21 [phone]

## 2021-11-24 NOTE — ED Notes (Signed)
This RN entered room to obtain pt's labwork, pt not in room at this time.

## 2021-11-24 NOTE — ED Triage Notes (Signed)
Pt BIB [facility], worsening weakening x1 month, exacerbated today, feeling "shaky".   CBG 366, VSS, NS in. Pt reports compliance w/ insulin.

## 2021-11-25 ENCOUNTER — Observation Stay (HOSPITAL_COMMUNITY): Payer: Medicare HMO

## 2021-11-25 ENCOUNTER — Emergency Department (HOSPITAL_COMMUNITY): Payer: Medicare HMO

## 2021-11-25 ENCOUNTER — Encounter (HOSPITAL_COMMUNITY): Payer: Self-pay

## 2021-11-25 DIAGNOSIS — T83490A Other mechanical complication of penile (implanted) prosthesis, initial encounter: Secondary | ICD-10-CM | POA: Diagnosis not present

## 2021-11-25 DIAGNOSIS — R531 Weakness: Secondary | ICD-10-CM

## 2021-11-25 DIAGNOSIS — M069 Rheumatoid arthritis, unspecified: Secondary | ICD-10-CM | POA: Diagnosis not present

## 2021-11-25 DIAGNOSIS — M6250 Muscle wasting and atrophy, not elsewhere classified, unspecified site: Secondary | ICD-10-CM

## 2021-11-25 DIAGNOSIS — R29898 Other symptoms and signs involving the musculoskeletal system: Secondary | ICD-10-CM | POA: Diagnosis not present

## 2021-11-25 DIAGNOSIS — M5124 Other intervertebral disc displacement, thoracic region: Secondary | ICD-10-CM | POA: Diagnosis not present

## 2021-11-25 DIAGNOSIS — M5126 Other intervertebral disc displacement, lumbar region: Secondary | ICD-10-CM | POA: Diagnosis not present

## 2021-11-25 DIAGNOSIS — M2578 Osteophyte, vertebrae: Secondary | ICD-10-CM | POA: Diagnosis not present

## 2021-11-25 LAB — TROPONIN I (HIGH SENSITIVITY): Troponin I (High Sensitivity): 6 ng/L (ref <18)

## 2021-11-25 LAB — CBG MONITORING, ED
Glucose-Capillary: 245 mg/dL — ABNORMAL HIGH (ref 70–99)
Glucose-Capillary: 255 mg/dL — ABNORMAL HIGH (ref 70–99)
Glucose-Capillary: 259 mg/dL — ABNORMAL HIGH (ref 70–99)
Glucose-Capillary: 325 mg/dL — ABNORMAL HIGH (ref 70–99)

## 2021-11-25 LAB — SEDIMENTATION RATE: Sed Rate: 28 mm/hr — ABNORMAL HIGH (ref 0–16)

## 2021-11-25 LAB — HEPATITIS PANEL, ACUTE
HCV Ab: NONREACTIVE
Hep A IgM: NONREACTIVE
Hep B C IgM: NONREACTIVE
Hepatitis B Surface Ag: NONREACTIVE

## 2021-11-25 LAB — HEMOGLOBIN A1C
Hgb A1c MFr Bld: 9.7 % — ABNORMAL HIGH (ref 4.8–5.6)
Mean Plasma Glucose: 231.69 mg/dL

## 2021-11-25 LAB — FOLATE: Folate: 14.8 ng/mL (ref >5.9)

## 2021-11-25 LAB — CK: Total CK: 20 U/L — ABNORMAL LOW (ref 49–397)

## 2021-11-25 LAB — VITAMIN B12: Vitamin B-12: 353 pg/mL (ref 180–914)

## 2021-11-25 IMAGING — MR MR CERVICAL SPINE WO/W CM
6 of 7 series · 29 of 48 positions shown · IV contrast (gadavist)
Comparison: No prior MRI, correlation is made with CT cervical
spine [DATE], CT thoracic and lumbar spine [DATE] and
[DATE]

CLINICAL DATA: Bilateral leg weakness history of rheumatoid
arthritis, on Humira; progressive chronic right-sided weakness

EXAM:
MRI CERVICAL, THORACIC AND LUMBAR SPINE WITHOUT AND WITH CONTRAST
TECHNIQUE: Multiplanar and multiecho pulse sequences of the cervical spine, to
include the craniocervical junction and cervicothoracic junction,
and thoracic and lumbar spine, were obtained without and with
intravenous contrast.
CONTRAST:  8.5mL GADAVIST GADOBUTROL 1 MMOL/ML IV SOLN

[Series 5: T2 · sagittal · 3.0mm · 0.69mm/px · 5 of 15 slices shown (1 of 2)]
[im 1/15]
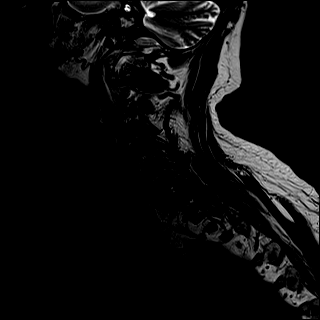
[im 4/15]
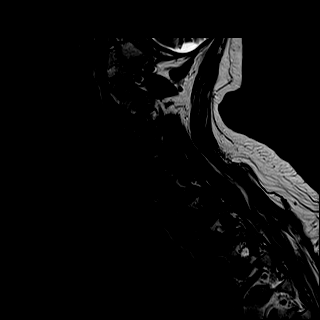
[im 8/15]
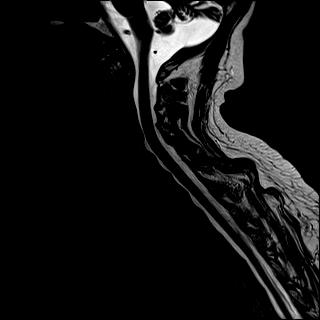
[im 11/15]
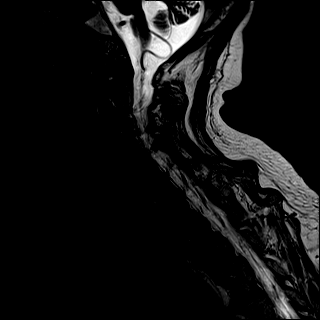
[im 15/15]
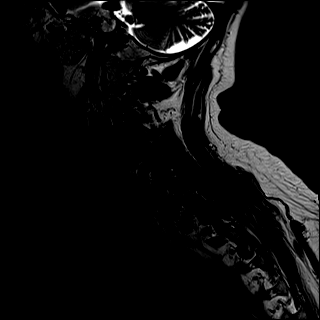

[Series 6: T1 · sagittal · 3.0mm · 0.69mm/px · 4 of 15 slices shown]
[im 1/15]
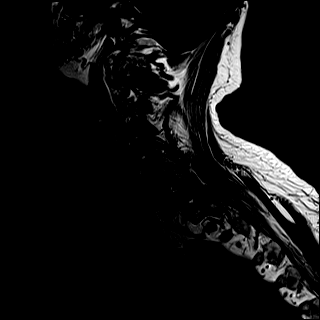
[im 5/15]
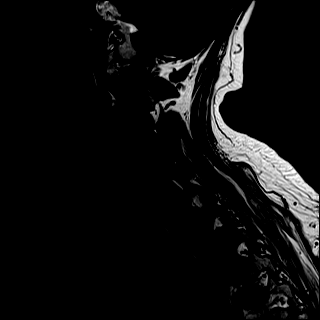
[im 10/15]
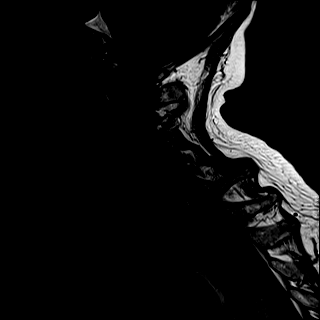
[im 15/15]
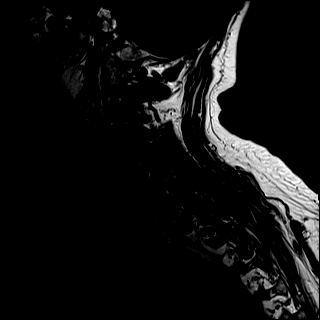

[Series 7: STIR · sagittal · 3.0mm · 0.86mm/px · 4 of 15 slices shown]
[im 1/15]
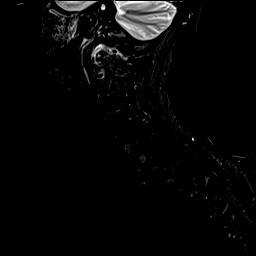
[im 5/15]
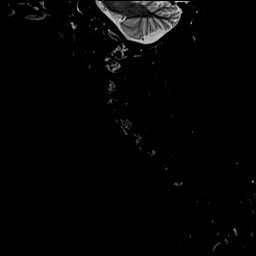
[im 10/15]
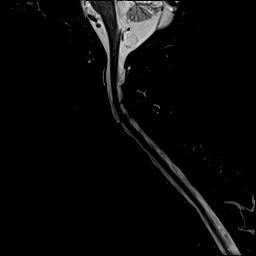
[im 15/15]
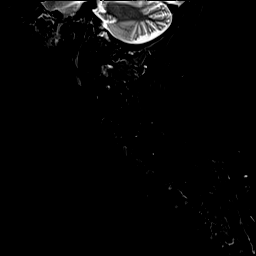

[Series 8: T2 · axial · 3.0mm · 0.66mm/px · z∈[-95,+15]mm · 8 of 40 slices shown (2 of 2)]
[im 1/40]
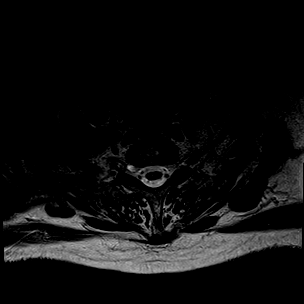
[im 5/40]
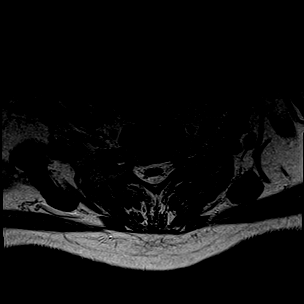
[im 14/40]
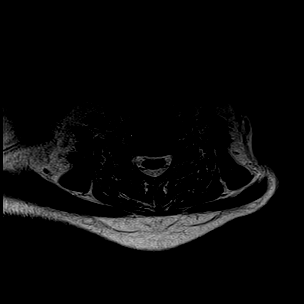
[im 18/40]
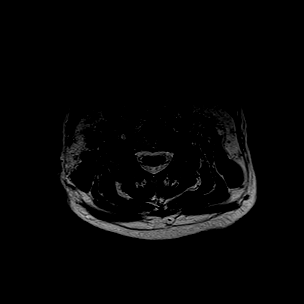
[im 22/40]
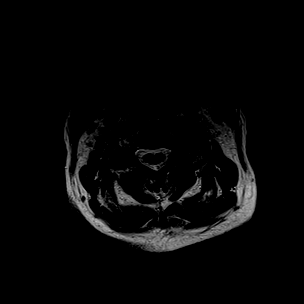
[im 27/40]
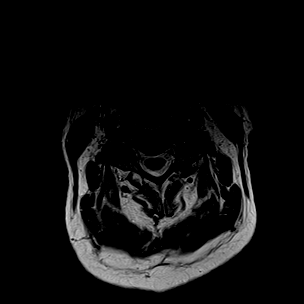
[im 35/40]
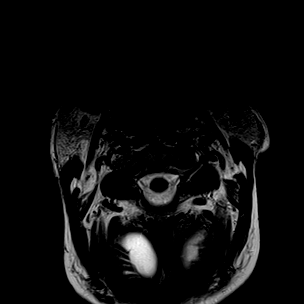
[im 40/40]
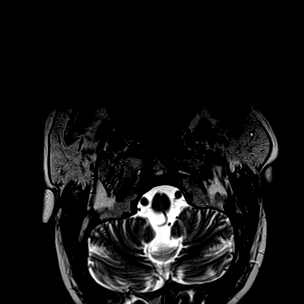

[Series 14: T1 fat-sat post-contrast · sagittal · 3.0mm · 0.43mm/px · 4 of 15 slices shown]
[im 1/15]
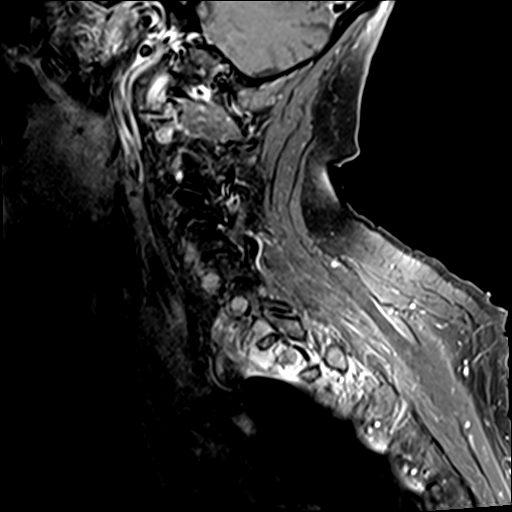
[im 5/15]
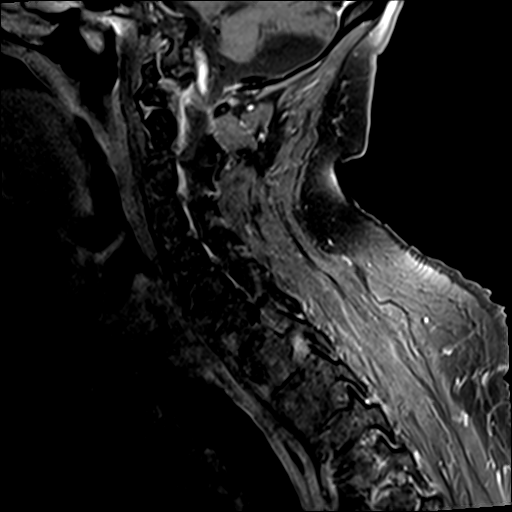
[im 10/15]
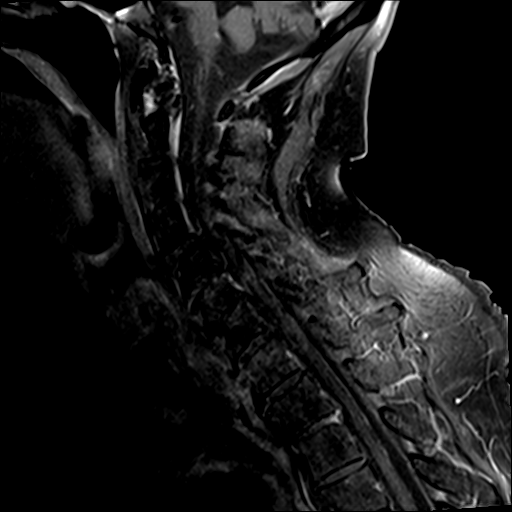
[im 15/15]
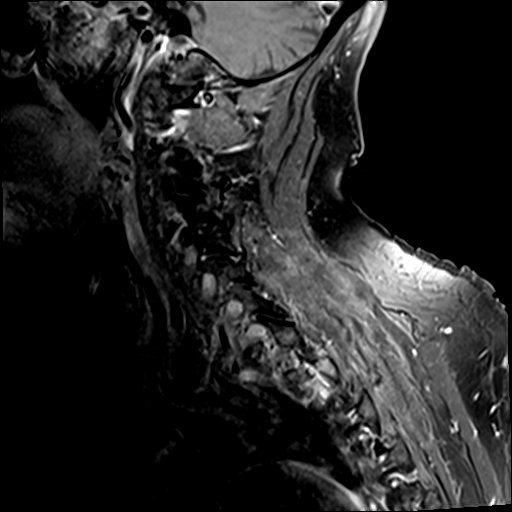

[Series 15: T1 post-contrast · axial · 3.0mm · 0.39mm/px · z∈[-151,-99]mm · 4 of 43 slices shown]
[im 1/43]
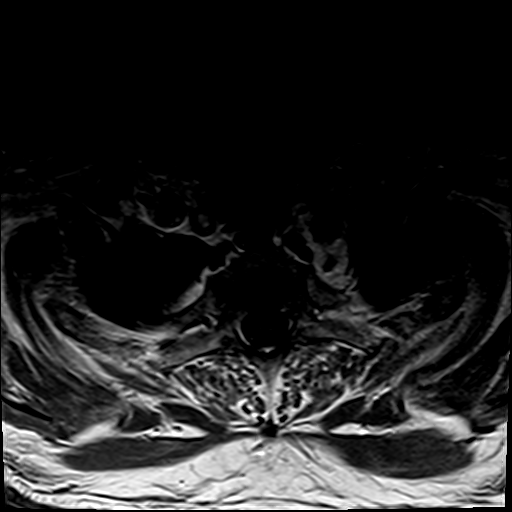
[im 9/43]
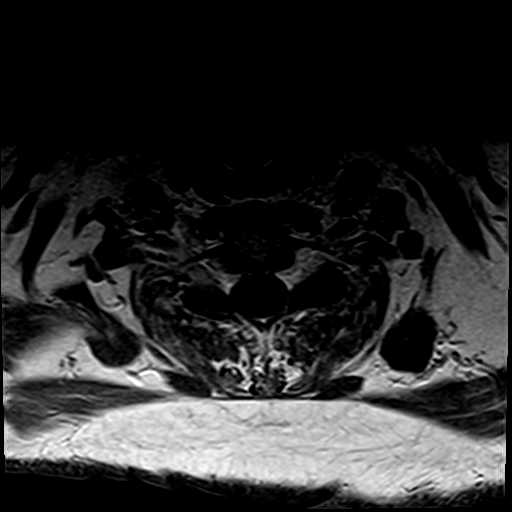
[im 13/43]
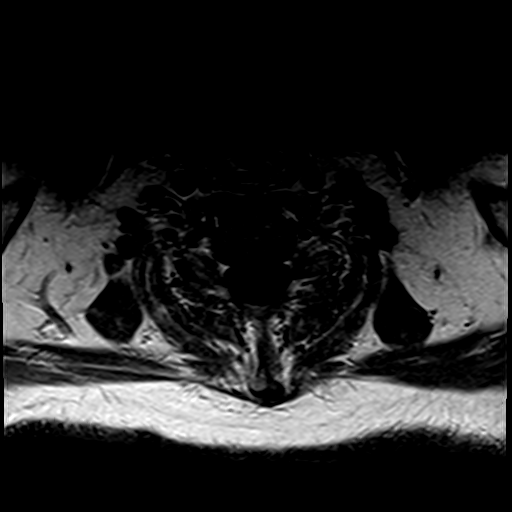
[im 17/43]
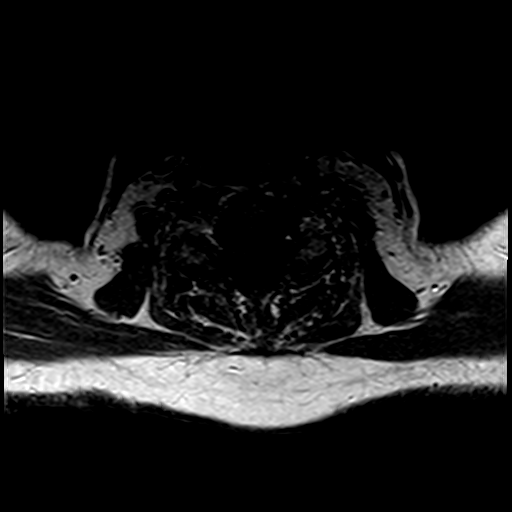

[29 of 48 positions shown; findings below may reference images not displayed]

FINDINGS: MRI CERVICAL SPINE FINDINGS

Alignment: No significant listhesis

Vertebrae: No acute fracture or suspicious osseous lesion. No
abnormal osseous enhancement.

Cord: Normal signal and morphology. No abnormal enhancement. No
epidural collection.

Posterior Fossa, vertebral arteries, paraspinal tissues: Possible
mega cisterna magna versus posterior fossa arachnoid cyst. Otherwise
negative.

Disc levels:

C2-C3: Minimal disc bulge. Left-greater-than-right facet
arthropathy. No spinal canal stenosis. Mild left neural foraminal
narrowing.

C3-C4: Mild disc bulge. Facet arthropathy. No spinal canal stenosis.
Mild bilateral neural foraminal narrowing.

C4-C5: Mild disc bulge. Facet and uncovertebral hypertrophy. No
spinal canal stenosis. Severe left neural foraminal narrowing.

C5-C6: Disc height loss with disc osteophyte complex. Facet and
uncovertebral hypertrophy. No spinal canal stenosis. Mild left
neural foraminal narrowing.

C6-C7: Disc height loss with disc osteophyte complex. Facet and
uncovertebral hypertrophy. No spinal canal stenosis. Mild bilateral
neural foraminal narrowing.

C7-T1: Mild disc bulge. Facet and uncovertebral hypertrophy. No
spinal canal stenosis or neural foraminal narrowing.

MRI THORACIC SPINE FINDINGS

Alignment: No significant listhesis. Slightly exaggerated thoracic
kyphosis. S shaped curvature of the thoracolumbar spine.

Vertebrae: Chronic compression deformity of T5, status post
kyphoplasty. New vertebral body height loss anteriorly at T11, which
does not demonstrate significant increased T2 signal and is favored
to be subacute to chronic. No acute fracture or suspicious osseous
lesion. No evidence of endplate cortical erosion.

Increased T2 signal and contrast enhancement is seen in the
intervertebral discs at T7-T8, T8-T9, T9-T10, T11-T12, and T12-L1.
At T8-T9 and T11-T12, there is also Schmorl's node formation with
increased T2 hyperintense signal and enhancement.

Cord: Normal signal and morphology. No abnormal spinal cord
enhancement. No epidural collection.

Paraspinal and other soft tissues: No acute finding.

Disc levels:

Small disc protrusions or bulges at T6-T7, T7-T8, T8-T9, T9-T10,
T10-T11, and T11-T12, which do not cause significant spinal canal
stenosis. The T7-T8 disc protrusion does indent the thecal sac and
cause mild deformation of the spinal cord (series 17, image 24).
Mild bilateral neural foraminal narrowing at T10-T11.

MRI LUMBAR SPINE FINDINGS

Segmentation:  Standard.

Alignment: S shaped curvature of the thoracolumbar spine, with mild
levocurvature of the lumbar spine. Trace retrolisthesis of L1 on L2.

Vertebrae: No acute fracture or suspicious osseous lesion. No
abnormal enhancement.

Conus medullaris and cauda equina: Conus extends to the L1 level.
Conus and cauda equina appear normal. No abnormal enhancement. No
epidural collection.

Paraspinal and other soft tissues: Right renal cyst.

Disc levels:

T12-L1: No significant disc bulge. No spinal canal stenosis or
neural foraminal narrowing.

L1-L2: Trace retrolisthesis and mild disc bulge. Mild facet
arthropathy. No spinal canal stenosis or neural foraminal narrowing.

L2-L3: No significant disc bulge. Mild facet arthropathy. No spinal
canal stenosis or neural foraminal narrowing.

L3-L4: No significant disc bulge. Mild facet arthropathy. No spinal
canal stenosis or neural foraminal narrowing.

L4-L5: Mild disc bulge with right paracentral and left foraminal
annular fissures. Mild facet arthropathy. Narrowing of the lateral
recesses. No spinal canal stenosis or neural foraminal narrowing.

L5-S1: Mild disc bulge with superimposed central protrusion. Mild
facet arthropathy. No spinal canal stenosis. Mild-to-moderate
bilateral neural foraminal narrowing.
IMPRESSION: CERVICAL SPINE

1. No spinal canal stenosis or spinal cord abnormality.
2. Multilevel uncovertebral and facet arthropathy, which causes
severe left neural foraminal narrowing at C4-C5, mild bilateral
neural foraminal narrowing at C3-C4 and C6-C7, and mild left neural
foraminal narrowing at C2-C3 and C5-C6.

THORACIC SPINE

1. No spinal canal stenosis or spinal cord abnormality.
2. Increased T2 signal and contrast enhancement in the
intervertebral discs from T6-T12, which is nonspecific but favored
to represent degenerative changes and edema given normal signal in
the adjacent endplates and vertebral bodies, although early discitis
can appear similar. Correlate with lab values.
3. Anterior superior endplate deformity at T11 is favored to be
subacute to chronic. No definite acute fracture in the thoracic
spine.
4. Mild bilateral neural foraminal narrowing at T10-T11.

LUMBAR SPINE

1. No spinal canal stenosis or spinal cord abnormality.
2. L5-S1 mild-to-moderate bilateral neural foraminal narrowing.

## 2021-11-25 IMAGING — CT CT L SPINE W/ CM
3 of 4 series · 12 of 33 positions shown, 15 images · IV contrast (agent unspecified)
Comparison: CT of the lumbar spine without contrast [DATE].

CLINICAL DATA: Ataxia, nontraumatic, lumbar pathology suspected.

EXAM:
CT LUMBAR SPINE WITH CONTRAST
TECHNIQUE: Multidetector CT imaging of the lumbar spine was performed with
intravenous contrast administration.

[Series 4: l spine 2.0 st · axial · 0.39mm/px · z∈[+918,+1150]mm · 6 of 152 slices shown, 8 images]
[im 24/152  soft-tissue]
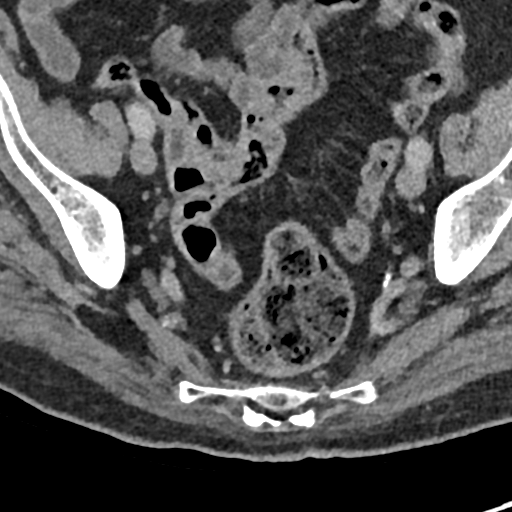
[im 24/152  bone]
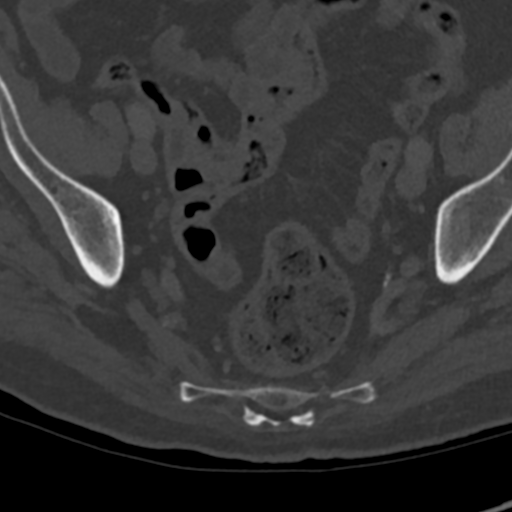
[im 47/152  bone]
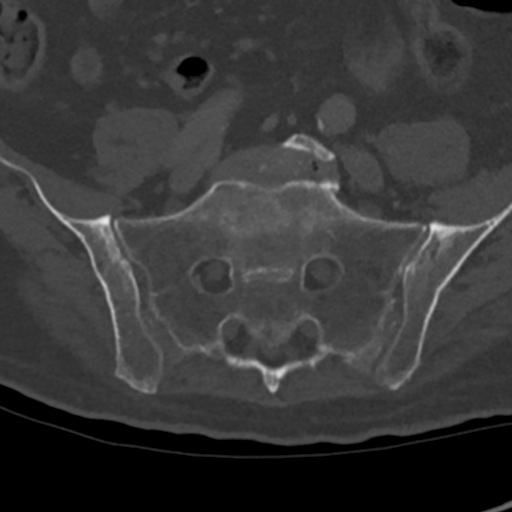
[im 70/152  bone]
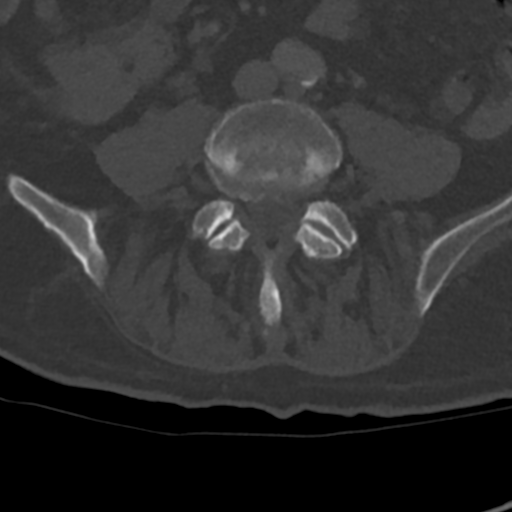
[im 93/152  bone]
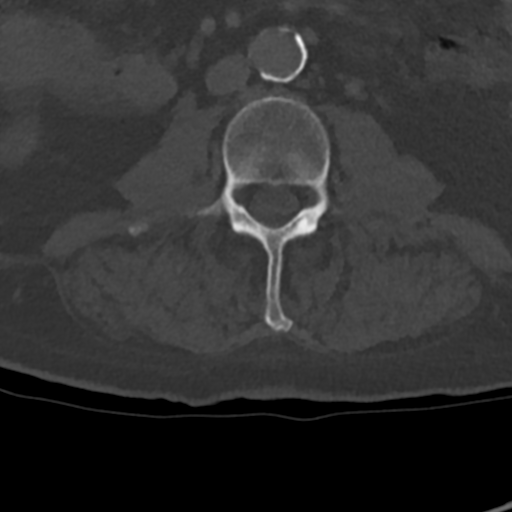
[im 117/152  soft-tissue]
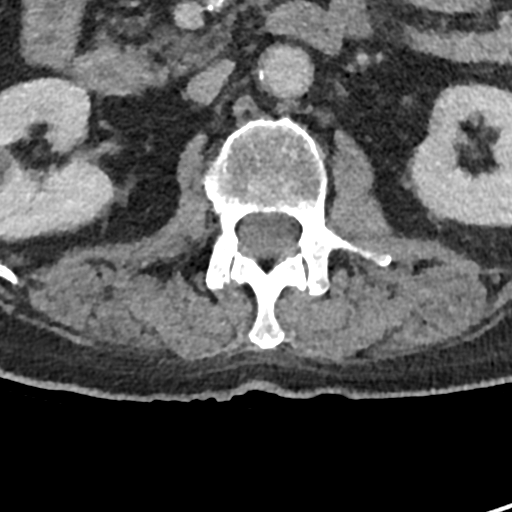
[im 117/152  bone]
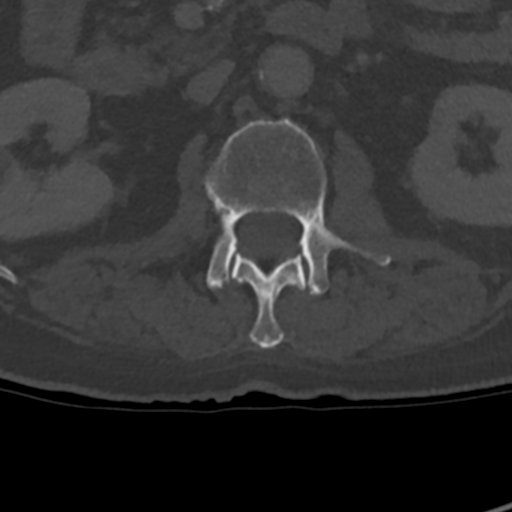
[im 140/152  bone]
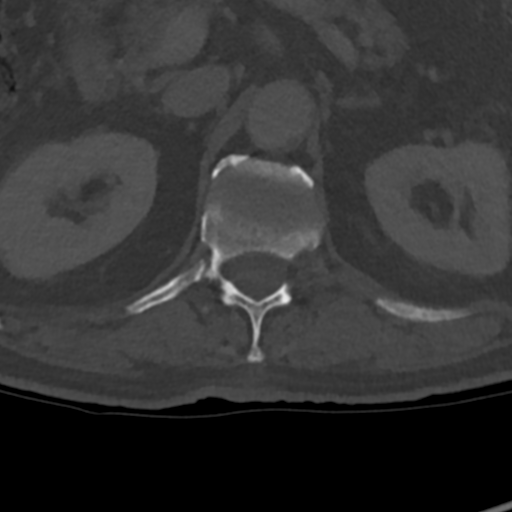

[Series 6: coronal bone · coronal · 0.36mm/px · 1 of 83 slices shown]
[im 42/83  bone]
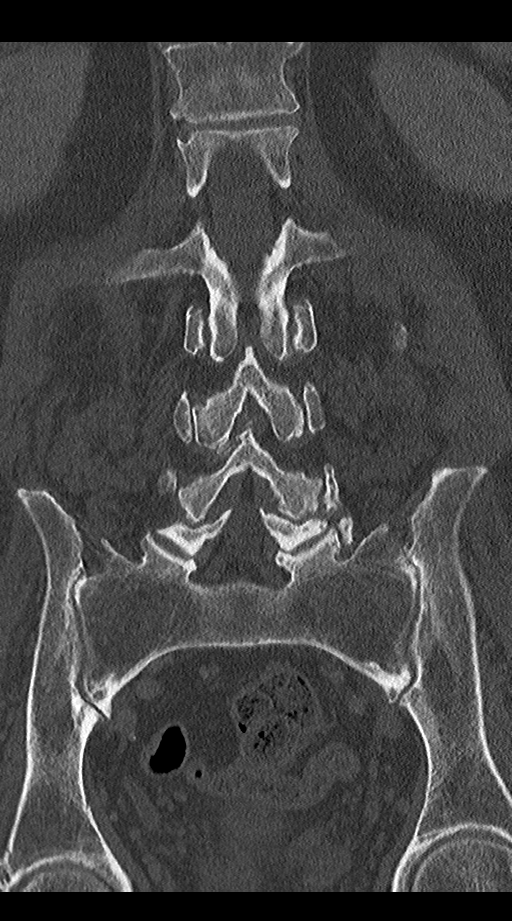

[Series 9: sagittal st · sagittal · 0.41mm/px · 5 of 88 slices shown, 6 images]
[im 30/88  bone]
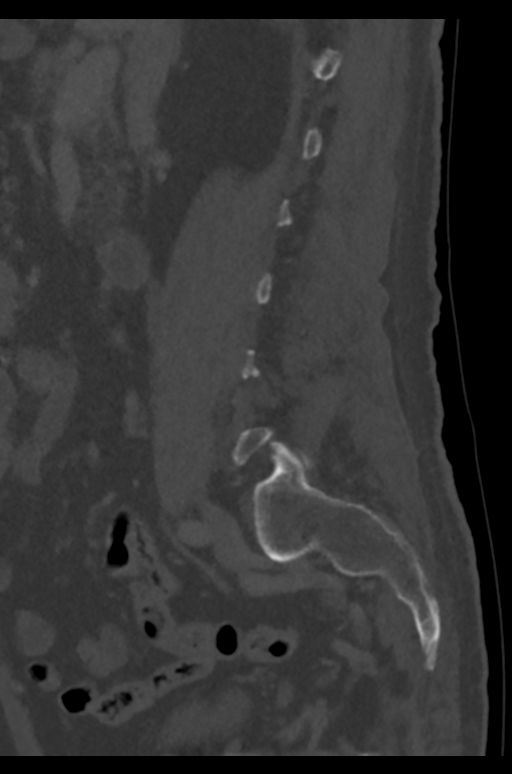
[im 37/88  bone]
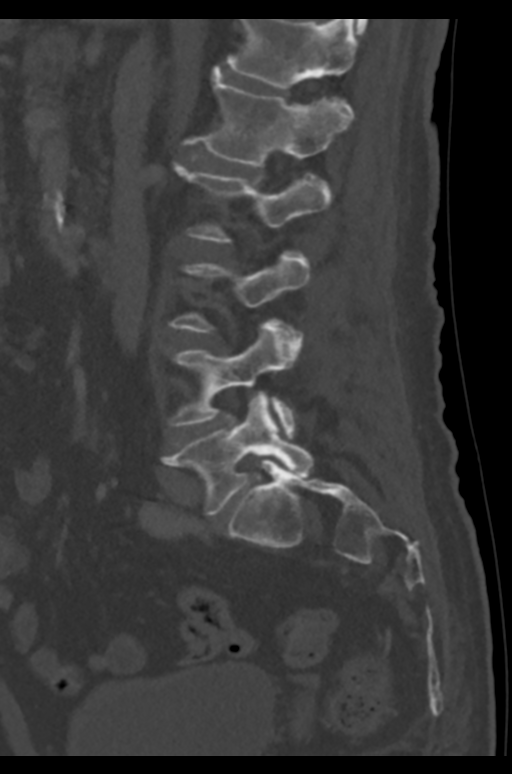
[im 44/88  soft-tissue]
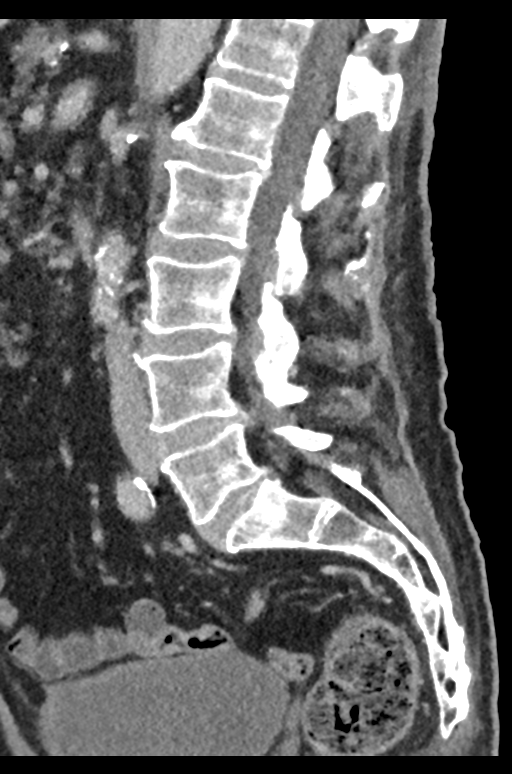
[im 44/88  bone]
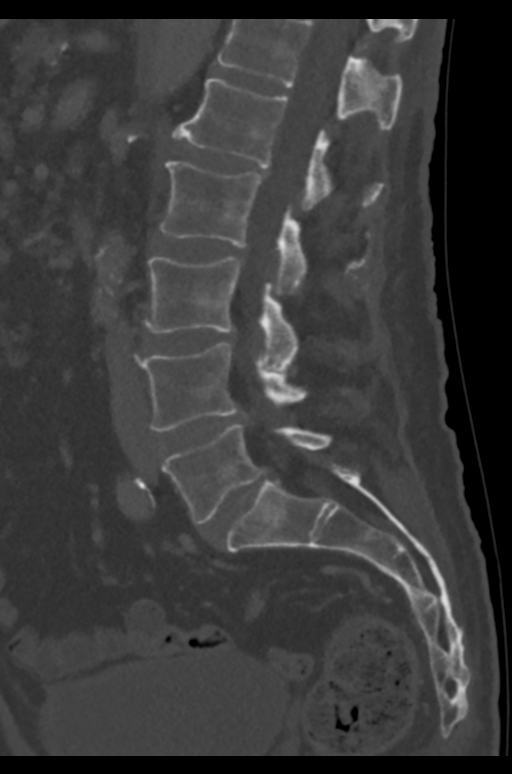
[im 51/88  bone]
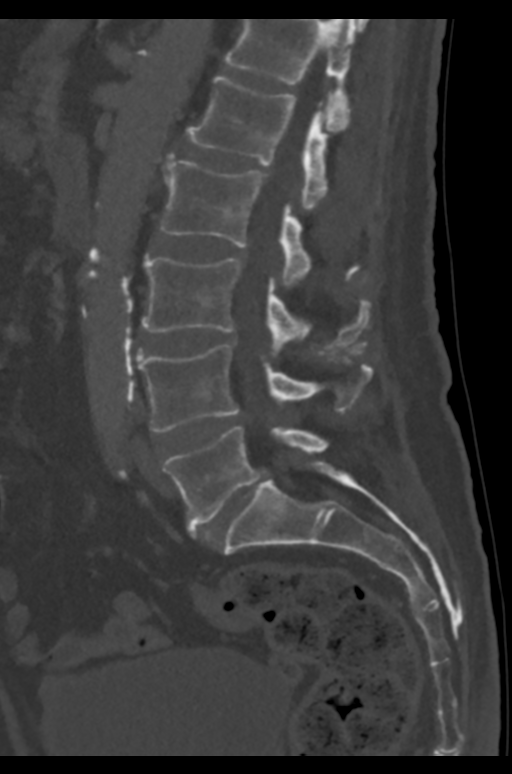
[im 59/88  bone]
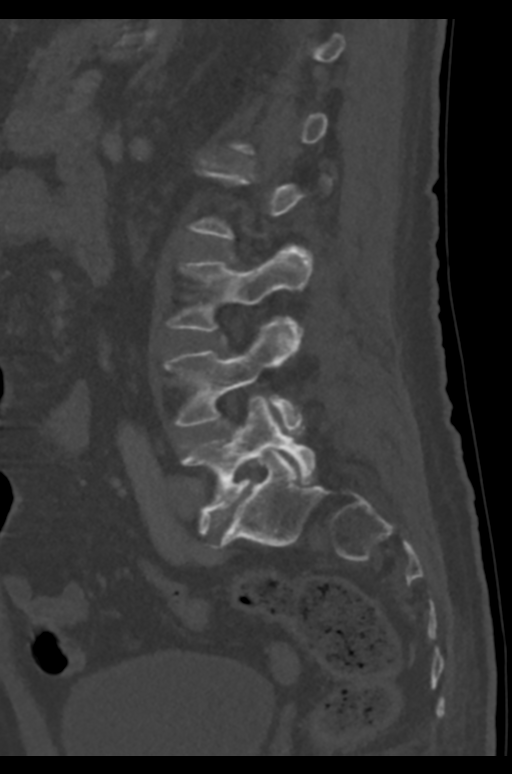

[12 of 33 positions shown; findings below may reference images not displayed]

RADIATION DOSE REDUCTION: This exam was performed according to the
departmental dose-optimization program which includes automated
exposure control, adjustment of the mA and/or kV according to
patient size and/or use of iterative reconstruction technique.

CONTRAST:  100mL OMNIPAQUE IOHEXOL 300 MG/ML  SOLN
FINDINGS: Segmentation: 5 non rib-bearing lumbar type vertebral bodies are
present. The lowest fully formed vertebral body is L5.

Alignment: No significant listhesis is present. Mild straightening
of the scratched at slight leftward curvature is noted.

Vertebrae: Vertebral body heights are maintained. No focal osseous
lesions are present.

Paraspinal and other soft tissues: Atherosclerotic calcifications
are present the aorta and branch vessels, particularly the splenic
artery. No solid organ lesions are present. No significant
adenopathy is present.

Disc levels: L1-2: Mild facet hypertrophy is present. No significant
disc protrusion or stenosis is present.

L2-3: Moderate facet hypertrophy is present. No significant disc
protrusion or stenosis is present.

L3-4: Mild disc bulging is present. Moderate facet hypertrophy is
noted. No significant focal stenosis is present.

L4-5: A broad-based disc protrusion present. Moderate facet
hypertrophy and ligamentum flavum thickening is noted. No
significant stenosis or change is present.

L5-S1: A broad-based disc protrusion is present. Asymmetric
left-sided facet hypertrophy and ligamentum flavum thickening
contributes to mild left subarticular narrowing. Moderate foraminal
stenosis is similar the prior study, left greater than right.
IMPRESSION: 1. Mild left subarticular and moderate foraminal stenosis at L5-S1
secondary to a broad-based disc protrusion and asymmetric left-sided
facet hypertrophy and ligamentum flavum thickening. This is similar
to the prior study, left greater than right.
2. Mild disc bulging and moderate facet hypertrophy at L3-4 and L4-5
without significant stenosis or change.
3. Aortic Atherosclerosis ([FX]-[FX]).

## 2021-11-25 IMAGING — MR MR LUMBAR SPINE WO/W CM
4 of 7 series · 21 of 48 positions shown · IV contrast (gadavist)
Comparison: No prior MRI, correlation is made with CT cervical
spine [DATE], CT thoracic and lumbar spine [DATE] and
[DATE]

CLINICAL DATA: Bilateral leg weakness history of rheumatoid
arthritis, on Humira; progressive chronic right-sided weakness

EXAM:
MRI CERVICAL, THORACIC AND LUMBAR SPINE WITHOUT AND WITH CONTRAST
TECHNIQUE: Multiplanar and multiecho pulse sequences of the cervical spine, to
include the craniocervical junction and cervicothoracic junction,
and thoracic and lumbar spine, were obtained without and with
intravenous contrast.
CONTRAST:  8.5mL GADAVIST GADOBUTROL 1 MMOL/ML IV SOLN

[Series 7: T1 · sagittal · 4.0mm · 0.88mm/px · 4 of 17 slices shown (1 of 2)]
[im 1/17]
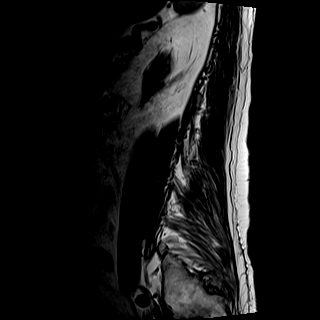
[im 6/17]
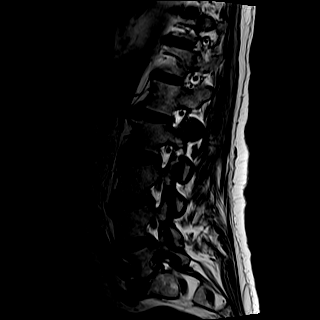
[im 11/17]
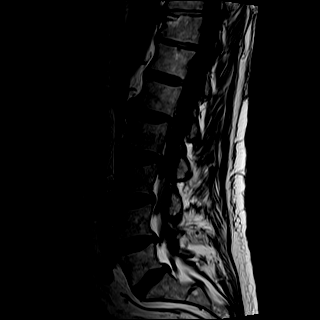
[im 17/17]
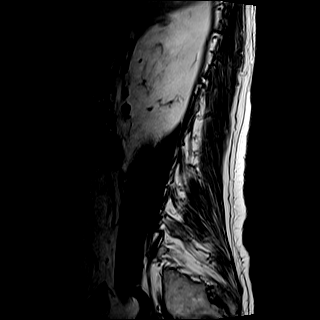

[Series 8: T2 · axial · 4.0mm · 0.57mm/px · z∈[-146,+47]mm · 8 of 37 slices shown (1 of 2)]
[im 1/37]
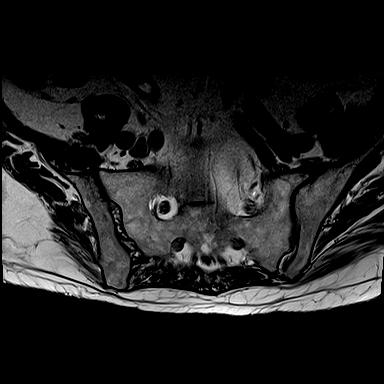
[im 5/37]
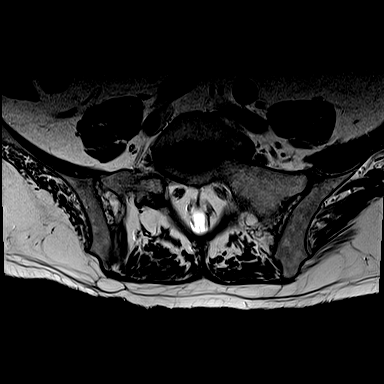
[im 13/37]
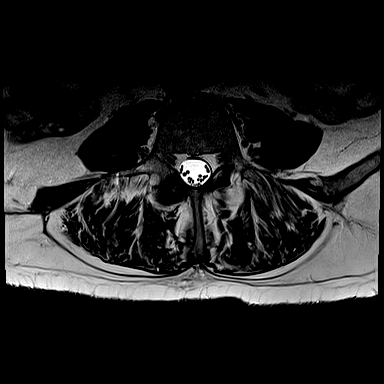
[im 17/37]
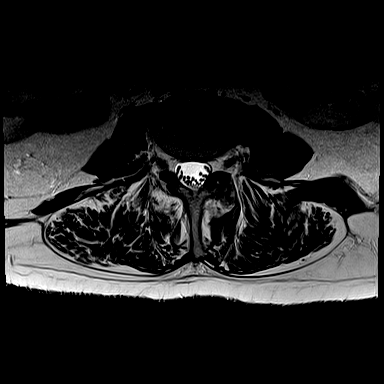
[im 21/37]
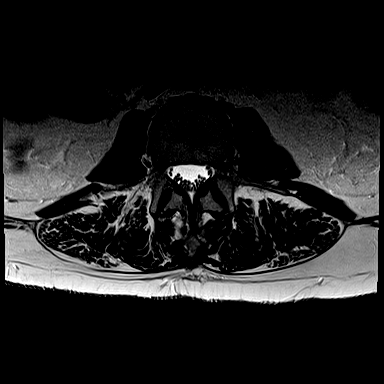
[im 25/37]
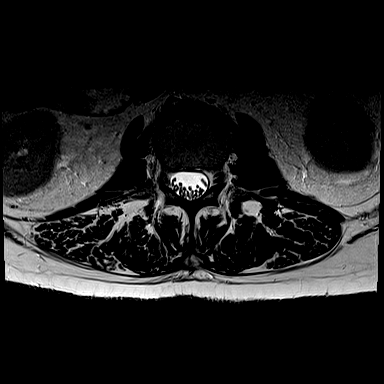
[im 33/37]
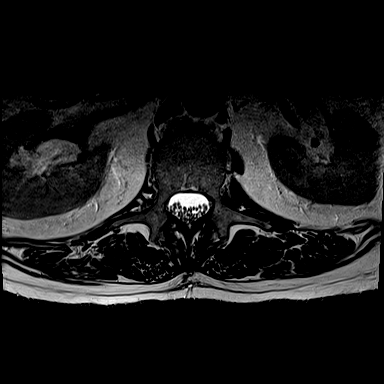
[im 37/37]
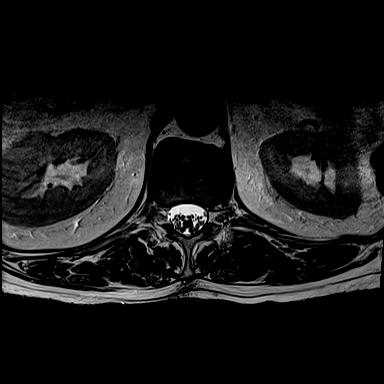

[Series 9: T1 · axial · 4.0mm · 0.34mm/px · z∈[-146,+27]mm · 4 of 37 slices shown (2 of 2)]
[im 1/37]
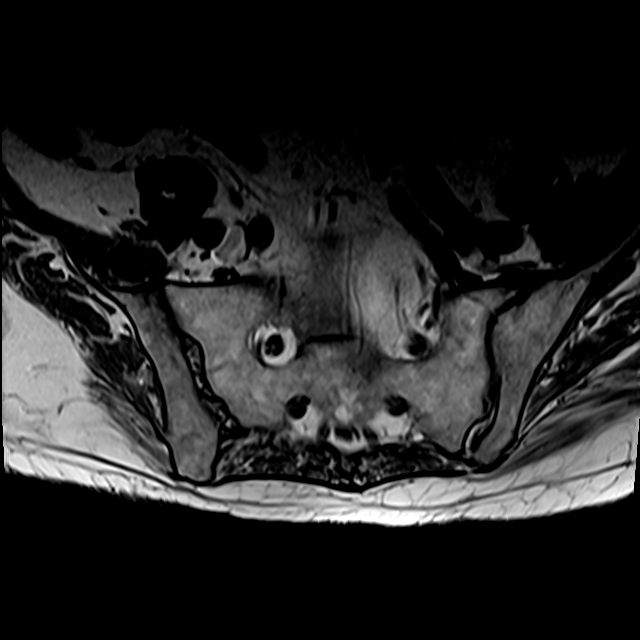
[im 5/37]
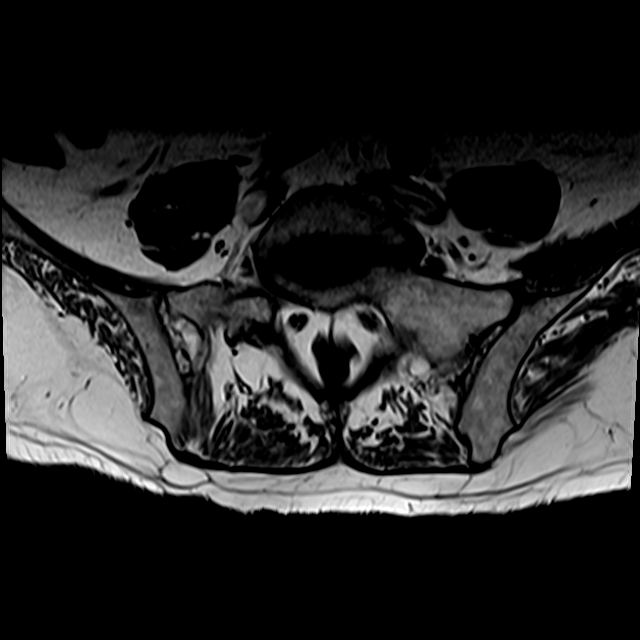
[im 21/37]
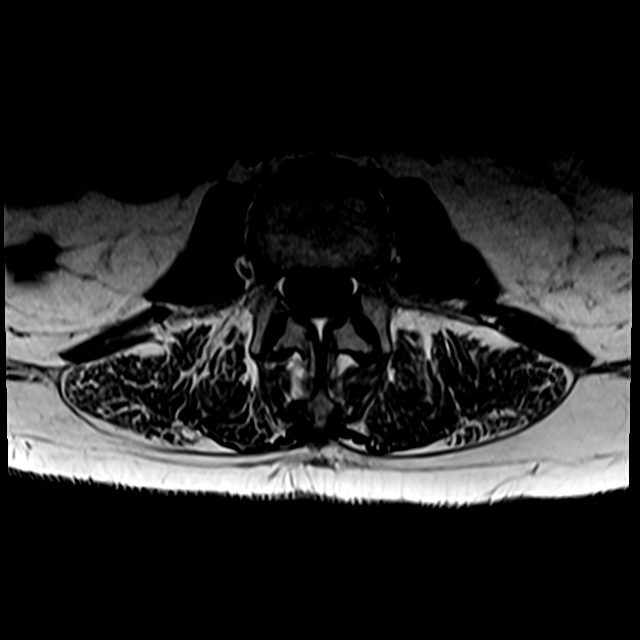
[im 33/37]
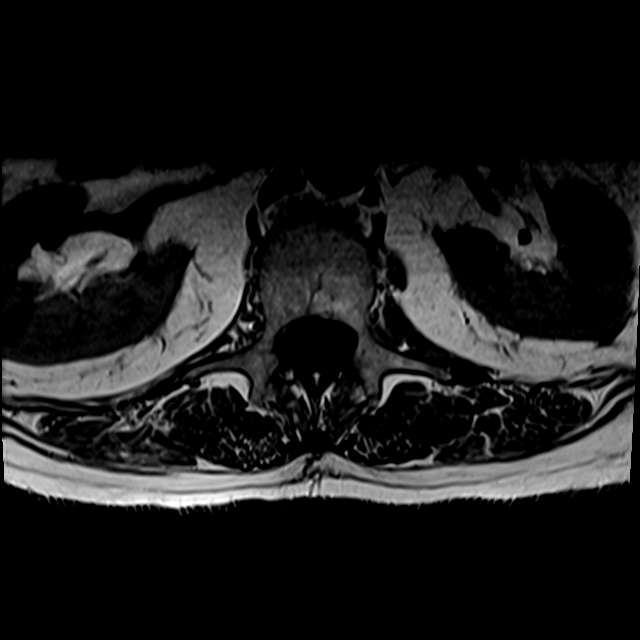

[Series 10: T2 · sagittal · 4.0mm · 0.73mm/px · 5 of 17 slices shown (2 of 2)]
[im 1/17]
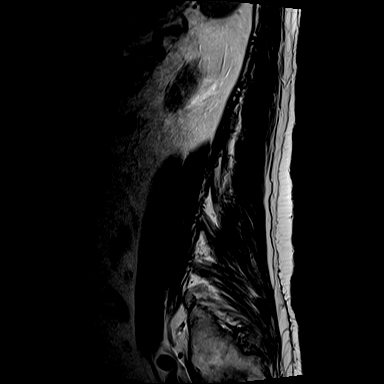
[im 5/17]
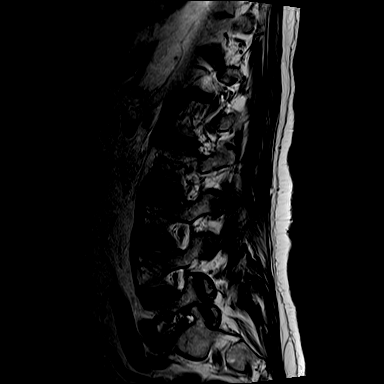
[im 9/17]
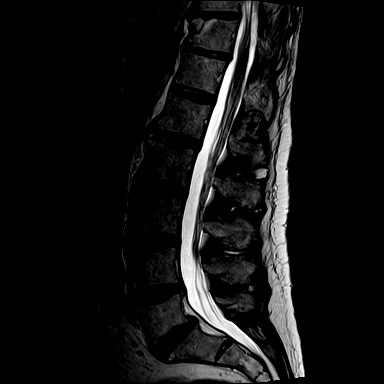
[im 13/17]
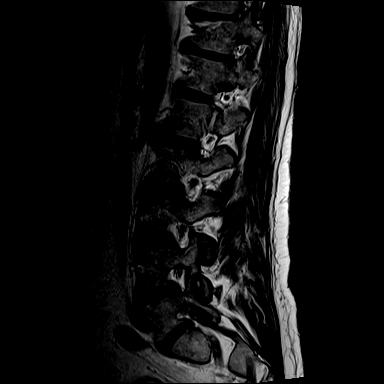
[im 17/17]
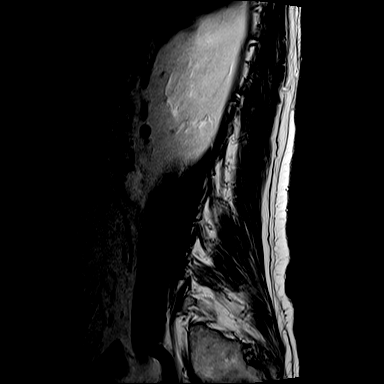

[21 of 48 positions shown; findings below may reference images not displayed]

FINDINGS: MRI CERVICAL SPINE FINDINGS

Alignment: No significant listhesis

Vertebrae: No acute fracture or suspicious osseous lesion. No
abnormal osseous enhancement.

Cord: Normal signal and morphology. No abnormal enhancement. No
epidural collection.

Posterior Fossa, vertebral arteries, paraspinal tissues: Possible
mega cisterna magna versus posterior fossa arachnoid cyst. Otherwise
negative.

Disc levels:

C2-C3: Minimal disc bulge. Left-greater-than-right facet
arthropathy. No spinal canal stenosis. Mild left neural foraminal
narrowing.

C3-C4: Mild disc bulge. Facet arthropathy. No spinal canal stenosis.
Mild bilateral neural foraminal narrowing.

C4-C5: Mild disc bulge. Facet and uncovertebral hypertrophy. No
spinal canal stenosis. Severe left neural foraminal narrowing.

C5-C6: Disc height loss with disc osteophyte complex. Facet and
uncovertebral hypertrophy. No spinal canal stenosis. Mild left
neural foraminal narrowing.

C6-C7: Disc height loss with disc osteophyte complex. Facet and
uncovertebral hypertrophy. No spinal canal stenosis. Mild bilateral
neural foraminal narrowing.

C7-T1: Mild disc bulge. Facet and uncovertebral hypertrophy. No
spinal canal stenosis or neural foraminal narrowing.

MRI THORACIC SPINE FINDINGS

Alignment: No significant listhesis. Slightly exaggerated thoracic
kyphosis. S shaped curvature of the thoracolumbar spine.

Vertebrae: Chronic compression deformity of T5, status post
kyphoplasty. New vertebral body height loss anteriorly at T11, which
does not demonstrate significant increased T2 signal and is favored
to be subacute to chronic. No acute fracture or suspicious osseous
lesion. No evidence of endplate cortical erosion.

Increased T2 signal and contrast enhancement is seen in the
intervertebral discs at T7-T8, T8-T9, T9-T10, T11-T12, and T12-L1.
At T8-T9 and T11-T12, there is also Schmorl's node formation with
increased T2 hyperintense signal and enhancement.

Cord: Normal signal and morphology. No abnormal spinal cord
enhancement. No epidural collection.

Paraspinal and other soft tissues: No acute finding.

Disc levels:

Small disc protrusions or bulges at T6-T7, T7-T8, T8-T9, T9-T10,
T10-T11, and T11-T12, which do not cause significant spinal canal
stenosis. The T7-T8 disc protrusion does indent the thecal sac and
cause mild deformation of the spinal cord (series 17, image 24).
Mild bilateral neural foraminal narrowing at T10-T11.

MRI LUMBAR SPINE FINDINGS

Segmentation:  Standard.

Alignment: S shaped curvature of the thoracolumbar spine, with mild
levocurvature of the lumbar spine. Trace retrolisthesis of L1 on L2.

Vertebrae: No acute fracture or suspicious osseous lesion. No
abnormal enhancement.

Conus medullaris and cauda equina: Conus extends to the L1 level.
Conus and cauda equina appear normal. No abnormal enhancement. No
epidural collection.

Paraspinal and other soft tissues: Right renal cyst.

Disc levels:

T12-L1: No significant disc bulge. No spinal canal stenosis or
neural foraminal narrowing.

L1-L2: Trace retrolisthesis and mild disc bulge. Mild facet
arthropathy. No spinal canal stenosis or neural foraminal narrowing.

L2-L3: No significant disc bulge. Mild facet arthropathy. No spinal
canal stenosis or neural foraminal narrowing.

L3-L4: No significant disc bulge. Mild facet arthropathy. No spinal
canal stenosis or neural foraminal narrowing.

L4-L5: Mild disc bulge with right paracentral and left foraminal
annular fissures. Mild facet arthropathy. Narrowing of the lateral
recesses. No spinal canal stenosis or neural foraminal narrowing.

L5-S1: Mild disc bulge with superimposed central protrusion. Mild
facet arthropathy. No spinal canal stenosis. Mild-to-moderate
bilateral neural foraminal narrowing.
IMPRESSION: CERVICAL SPINE

1. No spinal canal stenosis or spinal cord abnormality.
2. Multilevel uncovertebral and facet arthropathy, which causes
severe left neural foraminal narrowing at C4-C5, mild bilateral
neural foraminal narrowing at C3-C4 and C6-C7, and mild left neural
foraminal narrowing at C2-C3 and C5-C6.

THORACIC SPINE

1. No spinal canal stenosis or spinal cord abnormality.
2. Increased T2 signal and contrast enhancement in the
intervertebral discs from T6-T12, which is nonspecific but favored
to represent degenerative changes and edema given normal signal in
the adjacent endplates and vertebral bodies, although early discitis
can appear similar. Correlate with lab values.
3. Anterior superior endplate deformity at T11 is favored to be
subacute to chronic. No definite acute fracture in the thoracic
spine.
4. Mild bilateral neural foraminal narrowing at T10-T11.

LUMBAR SPINE

1. No spinal canal stenosis or spinal cord abnormality.
2. L5-S1 mild-to-moderate bilateral neural foraminal narrowing.

## 2021-11-25 IMAGING — MR MR THORACIC SPINE WO/W CM
7 of 10 series · 25 of 48 positions shown · IV contrast (gadavist)
Comparison: No prior MRI, correlation is made with CT cervical
spine [DATE], CT thoracic and lumbar spine [DATE] and
[DATE]

CLINICAL DATA: Bilateral leg weakness history of rheumatoid
arthritis, on Humira; progressive chronic right-sided weakness

EXAM:
MRI CERVICAL, THORACIC AND LUMBAR SPINE WITHOUT AND WITH CONTRAST
TECHNIQUE: Multiplanar and multiecho pulse sequences of the cervical spine, to
include the craniocervical junction and cervicothoracic junction,
and thoracic and lumbar spine, were obtained without and with
intravenous contrast.
CONTRAST:  8.5mL GADAVIST GADOBUTROL 1 MMOL/ML IV SOLN

[Series 12: T1 · sagittal · 5.0mm · 1.56mm/px · 1 of 9 slices shown (1 of 4)]
[im 1/9]
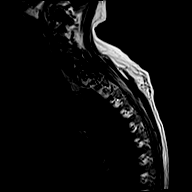

[Series 13: T1 · sagittal · 5.0mm · 1.23mm/px · 1 of 9 slices shown (2 of 4)]
[im 1/9]
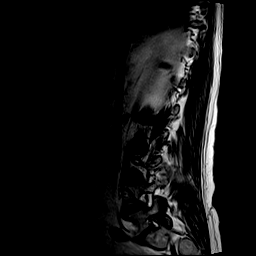

[Series 14: T2 · sagittal · 3.0mm · 0.76mm/px · 4 of 17 slices shown (1 of 2)]
[im 1/17]
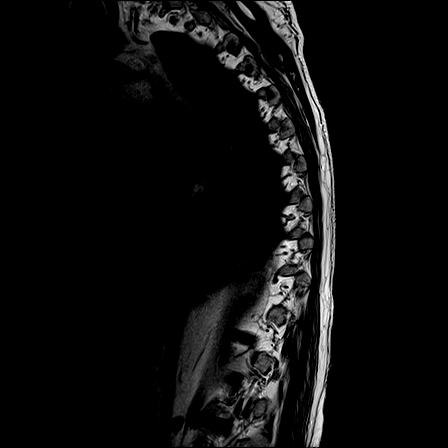
[im 6/17]
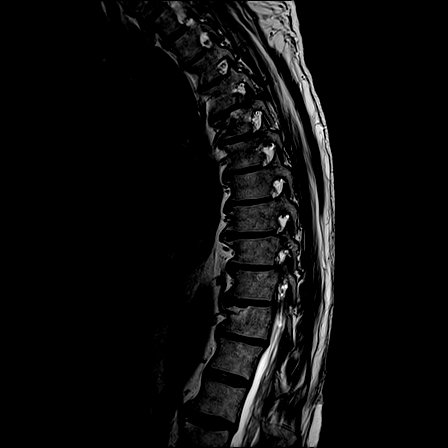
[im 11/17]
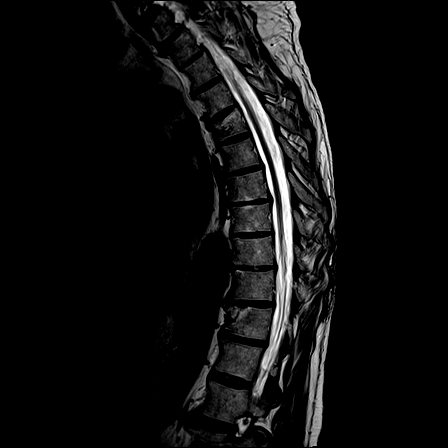
[im 17/17]
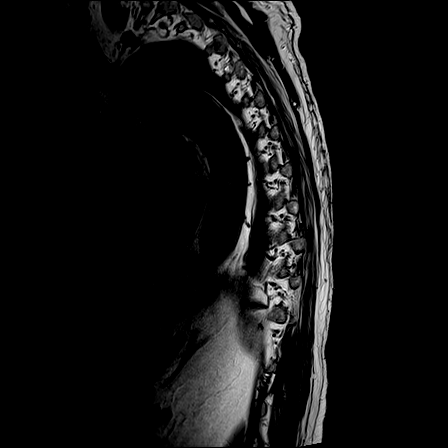

[Series 15: T1 · sagittal · 3.0mm · 0.76mm/px · 4 of 17 slices shown (3 of 4)]
[im 1/17]
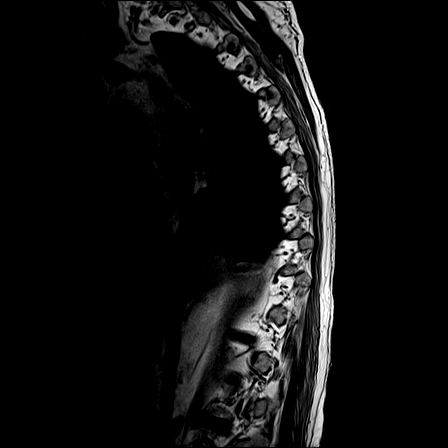
[im 6/17]
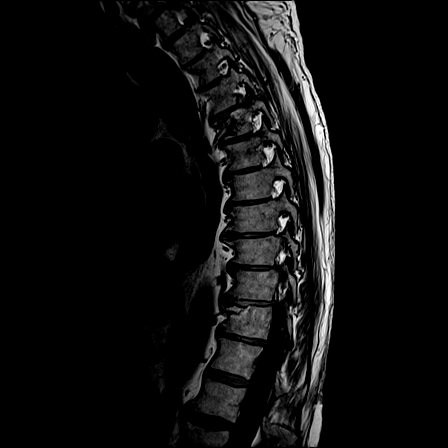
[im 11/17]
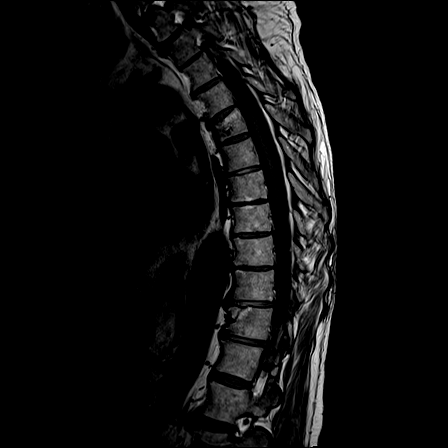
[im 17/17]
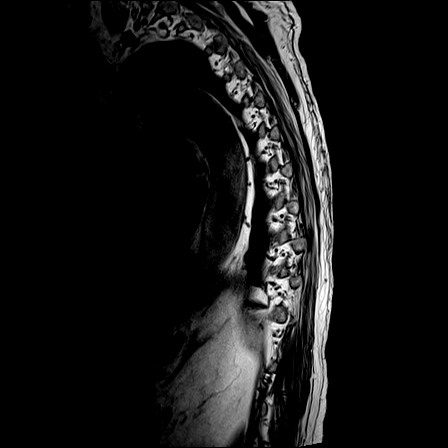

[Series 17: T2 · axial · 4.0mm · 0.59mm/px · z∈[-210,+10]mm · 9 of 40 slices shown (2 of 2)]
[im 1/40]
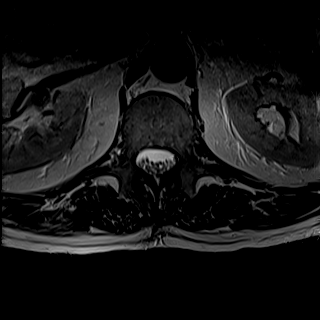
[im 5/40]
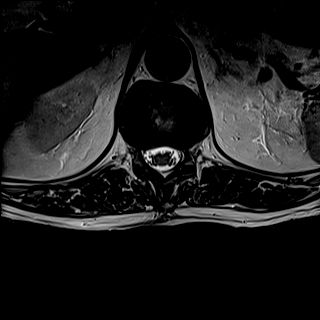
[im 10/40]
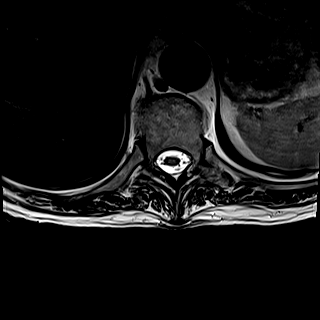
[im 15/40]
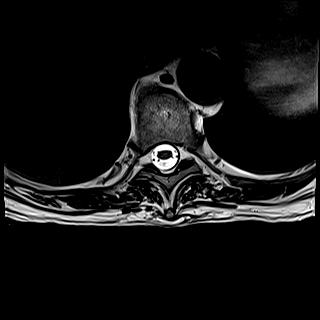
[im 20/40]
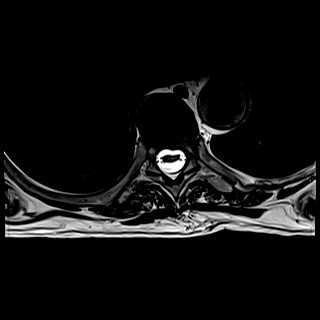
[im 25/40]
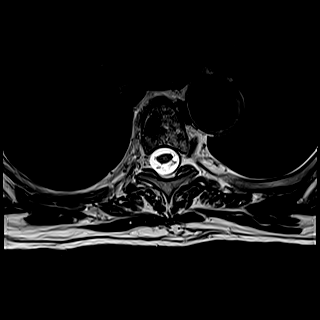
[im 30/40]
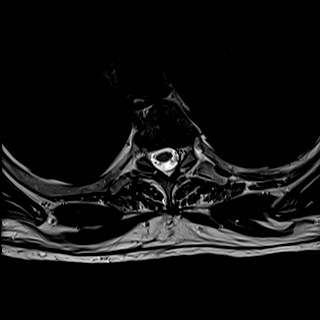
[im 35/40]
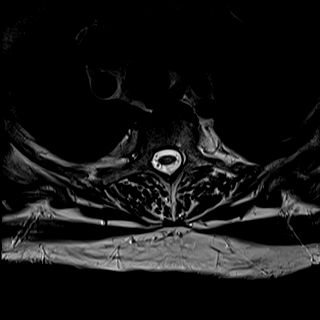
[im 40/40]
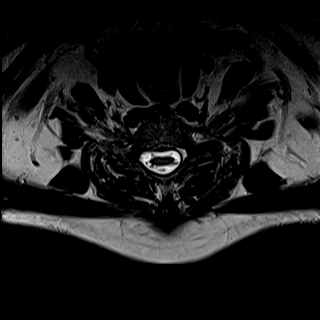

[Series 36: T1 · sagittal · 6.0mm · 1.23mm/px · 2 of 8 slices shown (4 of 4)]
[im 1/8]
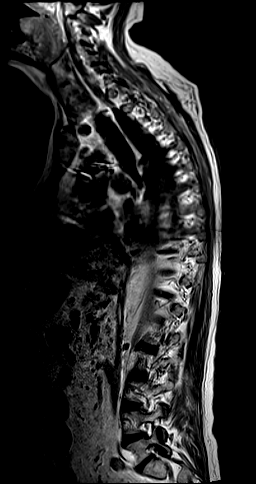
[im 8/8]
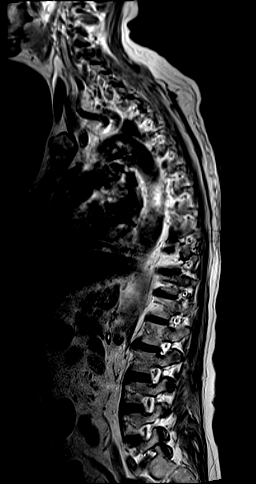

[Series 38: T1 fat-sat post-contrast · sagittal · 3.0mm · 0.76mm/px · 4 of 17 slices shown]
[im 1/17]
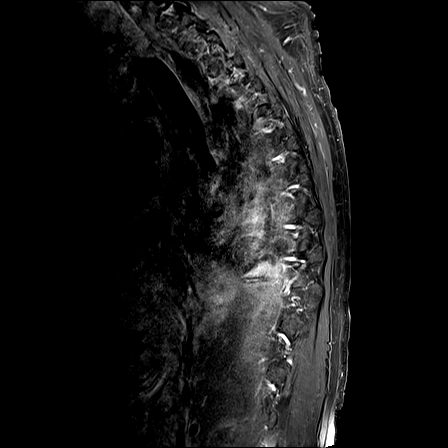
[im 6/17]
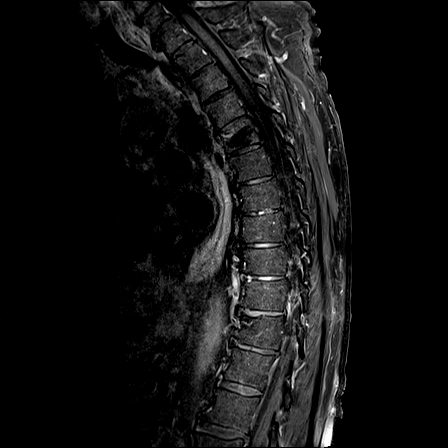
[im 11/17]
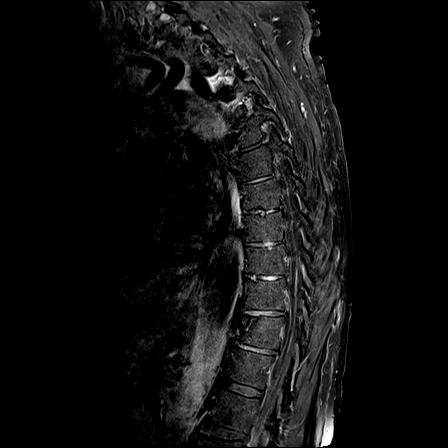
[im 17/17]
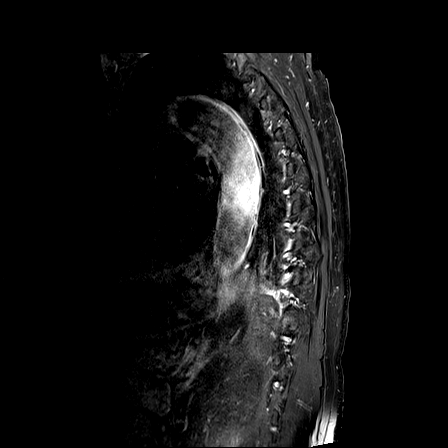

[25 of 48 positions shown; findings below may reference images not displayed]

FINDINGS: MRI CERVICAL SPINE FINDINGS

Alignment: No significant listhesis

Vertebrae: No acute fracture or suspicious osseous lesion. No
abnormal osseous enhancement.

Cord: Normal signal and morphology. No abnormal enhancement. No
epidural collection.

Posterior Fossa, vertebral arteries, paraspinal tissues: Possible
mega cisterna magna versus posterior fossa arachnoid cyst. Otherwise
negative.

Disc levels:

C2-C3: Minimal disc bulge. Left-greater-than-right facet
arthropathy. No spinal canal stenosis. Mild left neural foraminal
narrowing.

C3-C4: Mild disc bulge. Facet arthropathy. No spinal canal stenosis.
Mild bilateral neural foraminal narrowing.

C4-C5: Mild disc bulge. Facet and uncovertebral hypertrophy. No
spinal canal stenosis. Severe left neural foraminal narrowing.

C5-C6: Disc height loss with disc osteophyte complex. Facet and
uncovertebral hypertrophy. No spinal canal stenosis. Mild left
neural foraminal narrowing.

C6-C7: Disc height loss with disc osteophyte complex. Facet and
uncovertebral hypertrophy. No spinal canal stenosis. Mild bilateral
neural foraminal narrowing.

C7-T1: Mild disc bulge. Facet and uncovertebral hypertrophy. No
spinal canal stenosis or neural foraminal narrowing.

MRI THORACIC SPINE FINDINGS

Alignment: No significant listhesis. Slightly exaggerated thoracic
kyphosis. S shaped curvature of the thoracolumbar spine.

Vertebrae: Chronic compression deformity of T5, status post
kyphoplasty. New vertebral body height loss anteriorly at T11, which
does not demonstrate significant increased T2 signal and is favored
to be subacute to chronic. No acute fracture or suspicious osseous
lesion. No evidence of endplate cortical erosion.

Increased T2 signal and contrast enhancement is seen in the
intervertebral discs at T7-T8, T8-T9, T9-T10, T11-T12, and T12-L1.
At T8-T9 and T11-T12, there is also Schmorl's node formation with
increased T2 hyperintense signal and enhancement.

Cord: Normal signal and morphology. No abnormal spinal cord
enhancement. No epidural collection.

Paraspinal and other soft tissues: No acute finding.

Disc levels:

Small disc protrusions or bulges at T6-T7, T7-T8, T8-T9, T9-T10,
T10-T11, and T11-T12, which do not cause significant spinal canal
stenosis. The T7-T8 disc protrusion does indent the thecal sac and
cause mild deformation of the spinal cord (series 17, image 24).
Mild bilateral neural foraminal narrowing at T10-T11.

MRI LUMBAR SPINE FINDINGS

Segmentation:  Standard.

Alignment: S shaped curvature of the thoracolumbar spine, with mild
levocurvature of the lumbar spine. Trace retrolisthesis of L1 on L2.

Vertebrae: No acute fracture or suspicious osseous lesion. No
abnormal enhancement.

Conus medullaris and cauda equina: Conus extends to the L1 level.
Conus and cauda equina appear normal. No abnormal enhancement. No
epidural collection.

Paraspinal and other soft tissues: Right renal cyst.

Disc levels:

T12-L1: No significant disc bulge. No spinal canal stenosis or
neural foraminal narrowing.

L1-L2: Trace retrolisthesis and mild disc bulge. Mild facet
arthropathy. No spinal canal stenosis or neural foraminal narrowing.

L2-L3: No significant disc bulge. Mild facet arthropathy. No spinal
canal stenosis or neural foraminal narrowing.

L3-L4: No significant disc bulge. Mild facet arthropathy. No spinal
canal stenosis or neural foraminal narrowing.

L4-L5: Mild disc bulge with right paracentral and left foraminal
annular fissures. Mild facet arthropathy. Narrowing of the lateral
recesses. No spinal canal stenosis or neural foraminal narrowing.

L5-S1: Mild disc bulge with superimposed central protrusion. Mild
facet arthropathy. No spinal canal stenosis. Mild-to-moderate
bilateral neural foraminal narrowing.
IMPRESSION: CERVICAL SPINE

1. No spinal canal stenosis or spinal cord abnormality.
2. Multilevel uncovertebral and facet arthropathy, which causes
severe left neural foraminal narrowing at C4-C5, mild bilateral
neural foraminal narrowing at C3-C4 and C6-C7, and mild left neural
foraminal narrowing at C2-C3 and C5-C6.

THORACIC SPINE

1. No spinal canal stenosis or spinal cord abnormality.
2. Increased T2 signal and contrast enhancement in the
intervertebral discs from T6-T12, which is nonspecific but favored
to represent degenerative changes and edema given normal signal in
the adjacent endplates and vertebral bodies, although early discitis
can appear similar. Correlate with lab values.
3. Anterior superior endplate deformity at T11 is favored to be
subacute to chronic. No definite acute fracture in the thoracic
spine.
4. Mild bilateral neural foraminal narrowing at T10-T11.

LUMBAR SPINE

1. No spinal canal stenosis or spinal cord abnormality.
2. L5-S1 mild-to-moderate bilateral neural foraminal narrowing.

## 2021-11-25 IMAGING — CT CT T SPINE W/ CM
3 of 4 series · 12 of 33 positions shown, 14 images · IV contrast (agent unspecified)
Comparison: CT of the thoracic spine [DATE]. Thoracic spine
radiographs [DATE].

CLINICAL DATA: Ataxia, nontraumatic, thoracic pathology suspected.

EXAM:
CT THORACIC SPINE WITH CONTRAST
TECHNIQUE: Multidetector CT images of thoracic was performed according to the
standard protocol following intravenous contrast administration.

[Series 4: t-spine 2.0 st · axial · 0.39mm/px · z∈[+1184,+1388]mm · 4 of 154 slices shown, 5 images]
[im 26/154  soft-tissue]
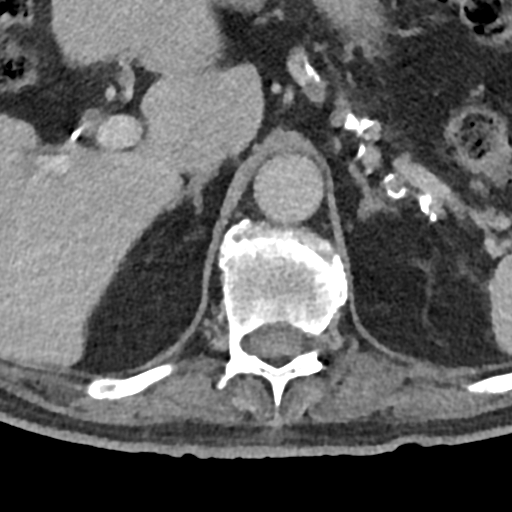
[im 26/154  bone]
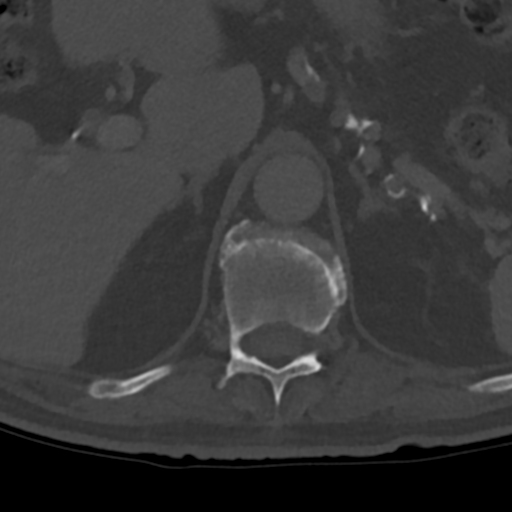
[im 52/154  bone]
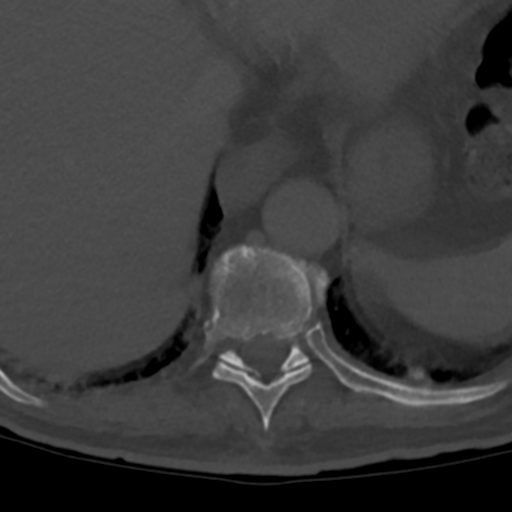
[im 103/154  bone]
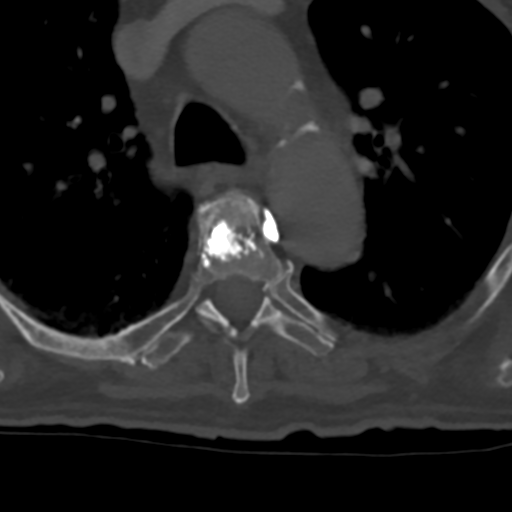
[im 128/154  bone]
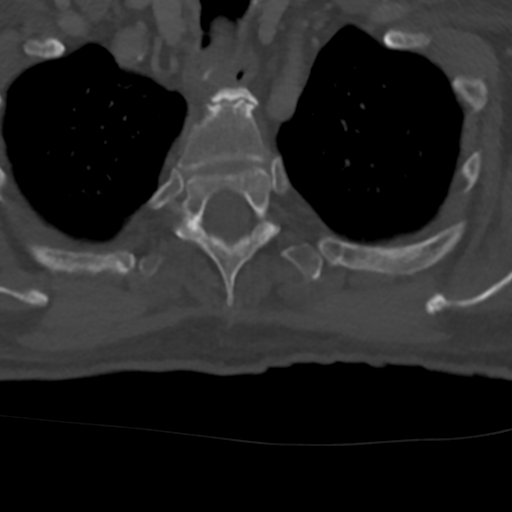

[Series 6: coronal bone · coronal · 0.38mm/px · 3 of 97 slices shown]
[im 20/97  bone]
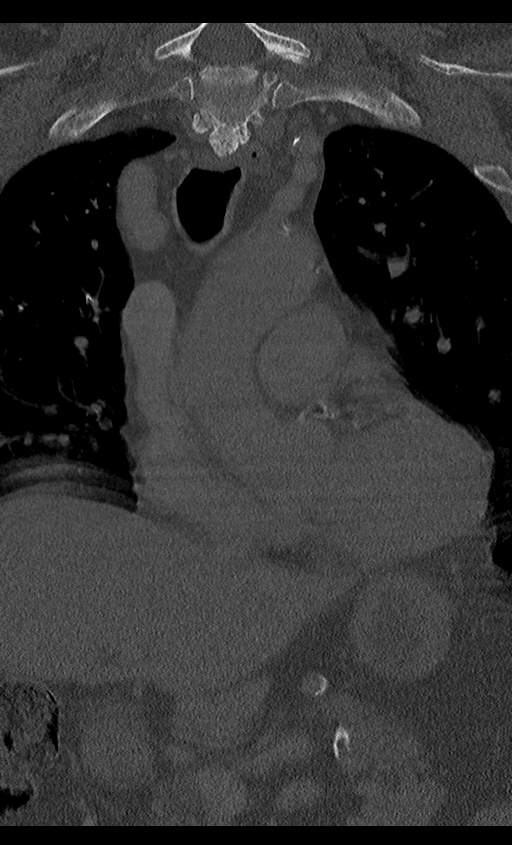
[im 39/97  bone]
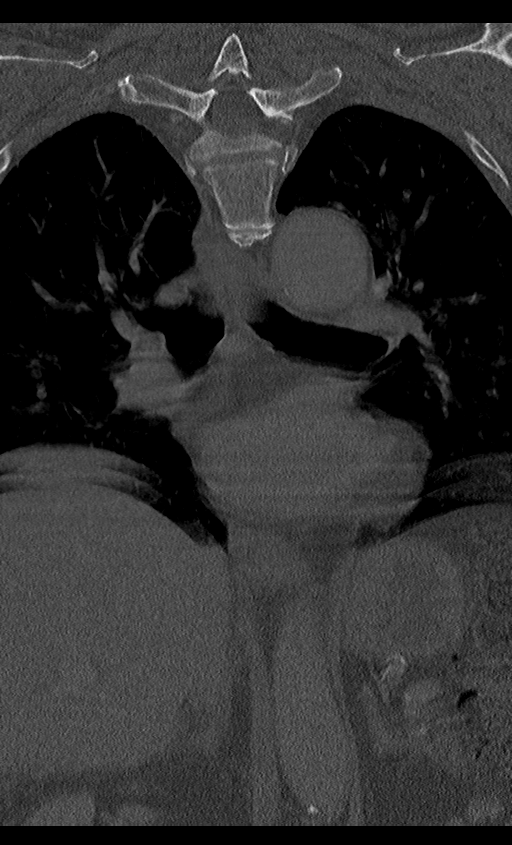
[im 58/97  bone]
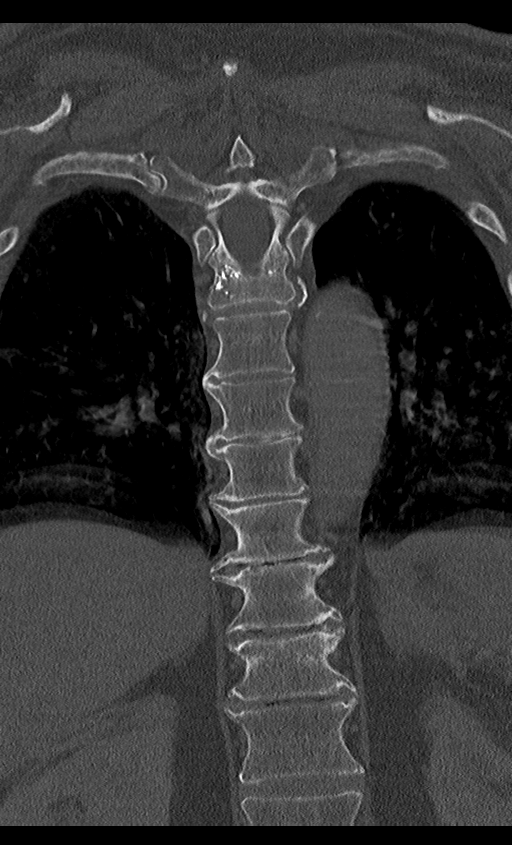

[Series 7: sagittal bone · sagittal · 0.45mm/px · 5 of 61 slices shown, 6 images]
[im 21/61  bone]
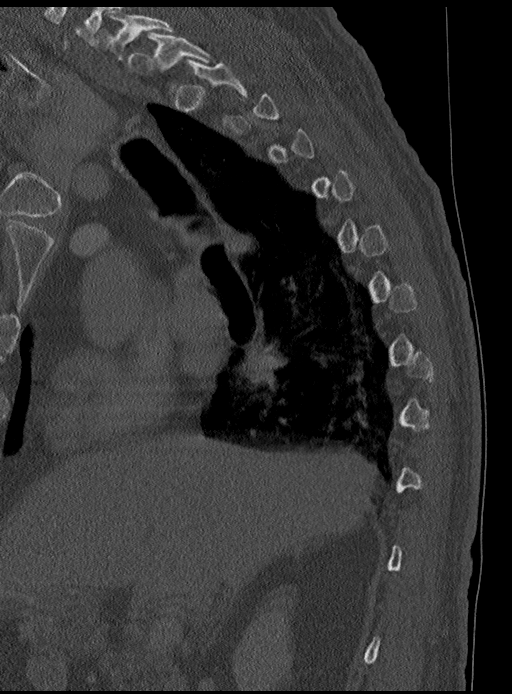
[im 26/61  bone]
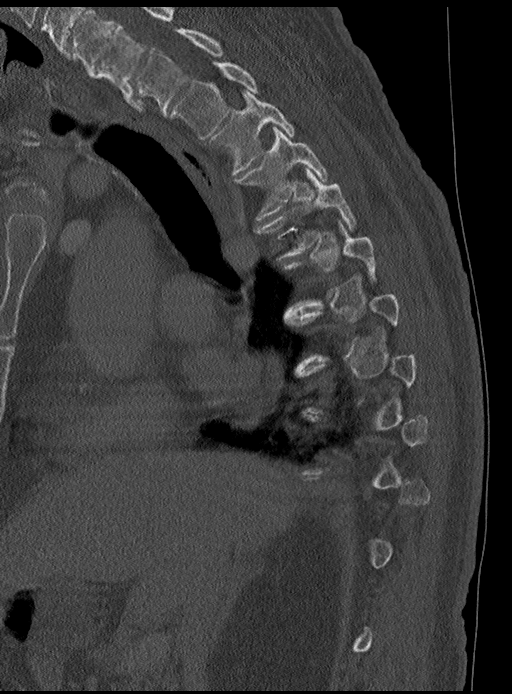
[im 31/61  soft-tissue]
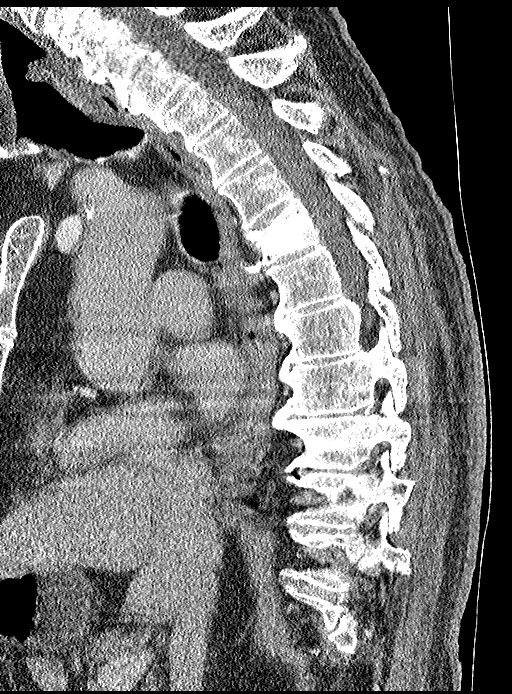
[im 31/61  bone]
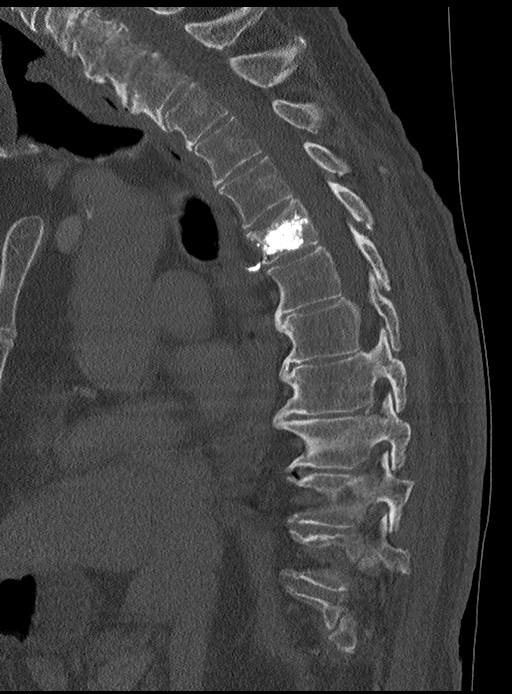
[im 36/61  bone]
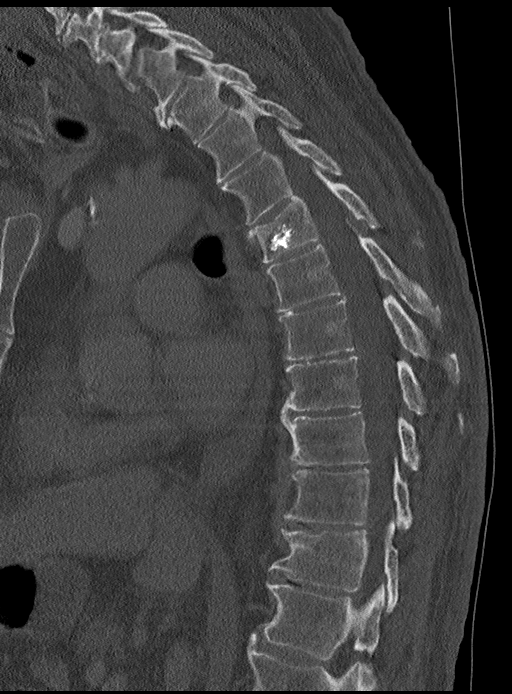
[im 41/61  bone]
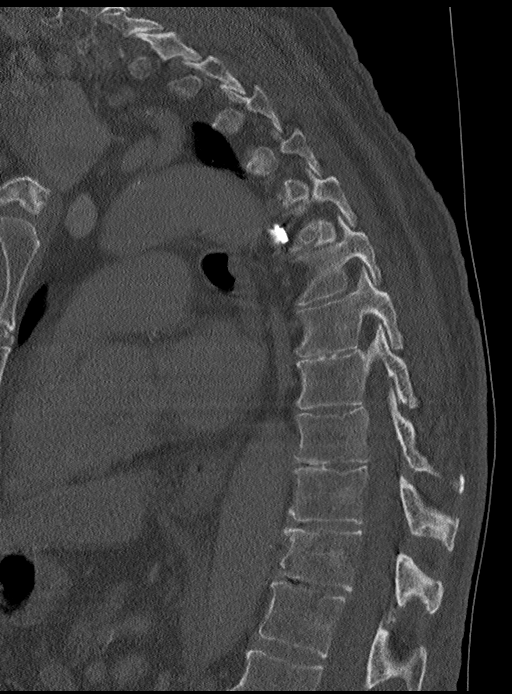

[12 of 33 positions shown; findings below may reference images not displayed]

RADIATION DOSE REDUCTION: This exam was performed according to the
departmental dose-optimization program which includes automated
exposure control, adjustment of the mA and/or kV according to
patient size and/or use of iterative reconstruction technique.

CONTRAST:  100mL OMNIPAQUE IOHEXOL 300 MG/ML  SOLN
FINDINGS: Alignment: No significant listhesis is present. Thoracic kyphosis is
stable.

Vertebrae: Spinal augmentation noted at T5. Anterior superior
endplate fracture with slight loss of height at T11 is new since the
prior exams. For posterior [DATE] of the vertebral body are maintained.
No other fractures are present.

Paraspinal and other soft tissues: Paraspinous soft tissues are
within normal limits. Mild dependent atelectasis is present the
lungs bilaterally. Atherosclerotic calcifications present in the
aorta without aneurysm. Visualized upper abdomen is within normal
limits. Patient is status post cholecystectomy.

Disc levels: No significant thoracic disc disease is present.
Foramina are patent.
IMPRESSION: 1. Anterior superior endplate fracture with slight loss of height at
T11 is new since the prior exams. Acuity is indeterminate.
2. Spinal augmentation at T5.
3. Stable thoracic kyphosis.
4. No significant disc disease or stenosis.
5. Aortic Atherosclerosis ([HG]-[HG]).

## 2021-11-25 MED ORDER — INSULIN ASPART 100 UNIT/ML IJ SOLN: 20.0000 [IU] | INTRAMUSCULAR | Status: DC

## 2021-11-25 MED ORDER — LORAZEPAM 2 MG/ML IJ SOLN
1.0000 mg | Freq: Once | INTRAMUSCULAR | Status: AC | PRN
  Administered 2021-11-25: 1 mg via INTRAVENOUS
  Filled 2021-11-25: qty 1

## 2021-11-25 MED ORDER — INSULIN GLARGINE-YFGN 100 UNIT/ML ~~LOC~~ SOLN
30.0000 [IU] | Freq: Every day | SUBCUTANEOUS | Status: DC
Start: 2021-11-25 — End: 2021-11-26
  Administered 2021-11-25: 30 [IU] via SUBCUTANEOUS
  Filled 2021-11-25 (×2): qty 0.3

## 2021-11-25 MED ORDER — DILTIAZEM HCL ER COATED BEADS 300 MG PO CP24
300.0000 mg | ORAL_CAPSULE | Freq: Every day | ORAL | Status: DC
Start: 2021-11-26 — End: 2021-11-25

## 2021-11-25 MED ORDER — DILTIAZEM HCL ER COATED BEADS 180 MG PO CP24
300.0000 mg | ORAL_CAPSULE | Freq: Every day | ORAL | Status: DC
  Administered 2021-11-25 – 2021-11-29 (×5): 300 mg via ORAL
  Filled 2021-11-25 (×5): qty 1

## 2021-11-25 MED ORDER — INSULIN ASPART 100 UNIT/ML IJ SOLN
0.0000 [IU] | Freq: Three times a day (TID) | INTRAMUSCULAR | Status: DC
  Administered 2021-11-25: 3 [IU] via SUBCUTANEOUS
  Administered 2021-11-25 (×2): 5 [IU] via SUBCUTANEOUS

## 2021-11-25 MED ORDER — ACETAMINOPHEN 325 MG PO TABS
650.0000 mg | ORAL_TABLET | Freq: Four times a day (QID) | ORAL | Status: DC | PRN
  Filled 2021-11-25: qty 2

## 2021-11-25 MED ORDER — INSULIN ASPART 100 UNIT/ML IJ SOLN
0.0000 [IU] | Freq: Every day | INTRAMUSCULAR | Status: DC
  Administered 2021-11-25: 4 [IU] via SUBCUTANEOUS

## 2021-11-25 MED ORDER — METHOTREXATE 2.5 MG PO TABS
15.0000 mg | ORAL_TABLET | ORAL | Status: DC
  Administered 2021-11-28: 15 mg via ORAL
  Filled 2021-11-25: qty 6

## 2021-11-25 MED ORDER — ACETAMINOPHEN 325 MG PO TABS
650.0000 mg | ORAL_TABLET | Freq: Four times a day (QID) | ORAL | Status: DC | PRN
Start: 2021-11-25 — End: 2021-11-29
  Administered 2021-11-25 – 2021-11-28 (×2): 650 mg via ORAL
  Filled 2021-11-25: qty 2

## 2021-11-25 MED ORDER — ALBUTEROL SULFATE (2.5 MG/3ML) 0.083% IN NEBU: 2.5000 mg | INHALATION_SOLUTION | Freq: Four times a day (QID) | RESPIRATORY_TRACT | Status: DC | PRN

## 2021-11-25 MED ORDER — INSULIN LISPRO (1 UNIT DIAL) 100 UNIT/ML (KWIKPEN): 20.0000 [IU] | PEN_INJECTOR | Freq: Three times a day (TID) | SUBCUTANEOUS | Status: DC

## 2021-11-25 MED ORDER — BASAGLAR KWIKPEN 100 UNIT/ML ~~LOC~~ SOPN: 30.0000 [IU] | PEN_INJECTOR | Freq: Every day | SUBCUTANEOUS | Status: DC

## 2021-11-25 MED ORDER — LORATADINE 10 MG PO TABS: 10.0000 mg | ORAL_TABLET | Freq: Every day | ORAL | Status: DC | PRN

## 2021-11-25 MED ORDER — ALBUTEROL SULFATE (2.5 MG/3ML) 0.083% IN NEBU: 2.5000 mg | INHALATION_SOLUTION | Freq: Four times a day (QID) | RESPIRATORY_TRACT | Status: DC

## 2021-11-25 MED ORDER — IOHEXOL 300 MG/ML  SOLN
100.0000 mL | Freq: Once | INTRAMUSCULAR | Status: AC | PRN
Start: 2021-11-25 — End: 2021-11-25
  Administered 2021-11-25: 100 mL via INTRAVENOUS

## 2021-11-25 MED ORDER — ACETAMINOPHEN 650 MG RE SUPP: 650.0000 mg | Freq: Four times a day (QID) | RECTAL | Status: DC | PRN

## 2021-11-25 MED ORDER — ENOXAPARIN SODIUM 40 MG/0.4ML IJ SOSY
40.0000 mg | PREFILLED_SYRINGE | INTRAMUSCULAR | Status: DC
  Administered 2021-11-25: 40 mg via SUBCUTANEOUS
  Filled 2021-11-25: qty 0.4

## 2021-11-25 MED ORDER — GADOBUTROL 1 MMOL/ML IV SOLN
8.5000 mL | Freq: Once | INTRAVENOUS | Status: AC | PRN
  Administered 2021-11-25: 8.5 mL via INTRAVENOUS

## 2021-11-25 MED ORDER — ADALIMUMAB 40 MG/0.4ML ~~LOC~~ AJKT: 0.4000 mL | AUTO-INJECTOR | SUBCUTANEOUS | Status: DC

## 2021-11-25 MED ORDER — HYDRALAZINE HCL 20 MG/ML IJ SOLN: 10.0000 mg | Freq: Four times a day (QID) | INTRAMUSCULAR | Status: DC | PRN

## 2021-11-25 NOTE — ED Notes (Signed)
MD notified of pt HR - Pt reporting no CP or SOB at this time

## 2021-11-25 NOTE — ED Notes (Signed)
PT in room with patient.

## 2021-11-25 NOTE — Consult Note (Addendum)
I was able to look back and find the operative report for Mr. Bejarano dated 07/25/1983.  There is an associated patient information form from [employer].  The patient has an AMS malleable 600.  This should be MRI safe/conditional.  Recommend against removal and instead follow proper protocol for an AMS malleable 600.   H&P Physician requesting consult: Binaya Dahal  Chief Complaint: Concern for penile prosthesis MRI safety profile  History of Present Illness: 76 year old male with a history of penile prosthesis placement by Dr. Patsi Sears.  He presented with progressive weakness over the past 2 years that has become severe over the past 2 weeks.  Recommendation is for MRI.  However, there was concern that he had a malleable prosthesis that was placed in the 80s.  I was consulted in regards to this.  I was able to find his operative report and associated prosthetic information that was scanned into our office system.  He underwent AMS malleable 600 penile prosthesis placement on 07/25/1983.  Past Medical History:  Diagnosis Date   Anginal pain ([name]    Arthritis    "fingers" (03/09/2012)   Cold feet    "from my diabetes; they stay cold" (03/09/2012)   Coronary artery disease    Coronary artery spasm [facility])    since age 50 Dr Aleen Campi   COVID 07/06/2019   hospitalizied   Dyslipidemia    Femoral DVT (deep venous thrombosis) ([name]    left   Headache(784.0)    Hx of gallstones    Dr Derrell Lolling lap cholecystectomy   Hx of plastic surgery    plastic surgery following a fall off a loading dock ,lost  all his upper teeth    Hypertension    MI, old    at age 89   Migraines    Pneumonia ~ 1962; 1970's   "double"; "regular" (03/09/2012)   Rheumatoid arthritis ([name]    Dr Dierdre Forth    Slipped cervical disc 06/10/2006   Dr Patsi Sears in the past/ Dr Larita Fife , chiropractor in 2008   Thyroid disease    Dr Lucianne Muss   Type II diabetes mellitus [facility])    Past Surgical History:  Procedure  Laterality Date   CARDIAC CATHETERIZATION     x 3 with NO stents    CHOLECYSTECTOMY  03/11/2012   Procedure: LAPAROSCOPIC CHOLECYSTECTOMY WITH INTRAOPERATIVE CHOLANGIOGRAM;  Surgeon: Ernestene Mention, MD;  Location: [facility] OR;  Service: General;  Laterality: N/A;   KYPHOPLASTY N/A 05/30/2021   Procedure: THORACIC FIVE KYPHOPLASTY;  Surgeon: Coletta Memos, MD;  Location: [facility] OR;  Service: Neurosurgery;  Laterality: N/A;  THORACIC FIVE KYPHOPLASTY   PENILE PROSTHESIS IMPLANT  1983    Home Medications:  (Not in a hospital admission)  Allergies:  Allergies  Allergen Reactions   Codeine Swelling   Penicillins Hives and Swelling    Has patient had a PCN reaction causing immediate rash, facial/tongue/throat swelling, SOB or lightheadedness with hypotension: Yes Has patient had a PCN reaction causing severe rash involving mucus membranes or skin necrosis: No Has patient had a PCN reaction that required hospitalization No Has patient had a PCN reaction occurring within the last 10 years: No If all of the above answers are "NO", then may proceed with Cephalosporin use.    Prednisone Hives   Wool Alcohol [Lanolin] Hives   Empagliflozin Nausea And Vomiting    Family History  Problem Relation Age of Onset   Heart failure Mother    Cancer - Other Mother  breast   Heart disease Father    Cancer - Other Sister        tongue cancer   Heart attack Sister    Social History:  reports that he has never smoked. He has never used smokeless tobacco. He reports that he does not drink alcohol and does not use drugs.  ROS: A complete review of systems was performed.  All systems are negative except for pertinent findings as noted. ROS   Physical Exam:  Vital signs in last 24 hours: Temp:  [98.2 F (36.8 C)] 98.2 F (36.8 C) (06/17 1958) Pulse Rate:  [66-103] 103 (06/18 1643) Resp:  [16-22] 18 (06/18 1643) BP: (118-163)/(69-103) 147/103 (06/18 1643) SpO2:  [97 %-100 %] 98 % (06/18  1643) General:  Alert and oriented, No acute distress HEENT: Normocephalic, atraumatic Neck: No JVD or lymphadenopathy Cardiovascular: Regular rate and rhythm Lungs: Regular rate and effort Abdomen: Soft, nontender, nondistended, no abdominal masses Back: No CVA tenderness Genitourinary: Circumcised phallus.  Malleable prosthesis in place in the corpora bilaterally.  No erosion.  No concern for infection. Extremities: No edema Neurologic: Grossly intact  Laboratory Data:  Results for orders placed or performed during the hospital encounter of 11/24/21 (from the past 24 hour(s))  Urinalysis, Routine w reflex microscopic Urine, Clean Catch     Status: Abnormal   Collection Time: 11/24/21  8:24 PM  Result Value Ref Range   Color, Urine YELLOW YELLOW   APPearance CLEAR CLEAR   Specific Gravity, Urine 1.032 (H) 1.005 - 1.030   pH 5.0 5.0 - 8.0   Glucose, UA >=500 (A) NEGATIVE mg/dL   Hgb urine dipstick NEGATIVE NEGATIVE   Bilirubin Urine NEGATIVE NEGATIVE   Ketones, ur 20 (A) NEGATIVE mg/dL   Protein, ur NEGATIVE NEGATIVE mg/dL   Nitrite NEGATIVE NEGATIVE   Leukocytes,Ua NEGATIVE NEGATIVE   RBC / HPF 0-5 0 - 5 RBC/hpf   WBC, UA 0-5 0 - 5 WBC/hpf   Bacteria, UA NONE SEEN NONE SEEN   Squamous Epithelial / LPF 0-5 0 - 5   Mucus PRESENT   CBC with Differential     Status: Abnormal   Collection Time: 11/24/21  9:33 PM  Result Value Ref Range   WBC 11.0 (H) 4.0 - 10.5 K/uL   RBC 4.66 4.22 - 5.81 MIL/uL   Hemoglobin 14.7 13.0 - 17.0 g/dL   HCT 43.6 39.0 - 52.0 %   MCV 93.6 80.0 - 100.0 fL   MCH 31.5 26.0 - 34.0 pg   MCHC 33.7 30.0 - 36.0 g/dL   RDW 14.4 11.5 - 15.5 %   Platelets 263 150 - 400 K/uL   nRBC 0.0 0.0 - 0.2 %   Neutrophils Relative % 74 %   Neutro Abs 8.3 (H) 1.7 - 7.7 K/uL   Lymphocytes Relative 16 %   Lymphs Abs 1.7 0.7 - 4.0 K/uL   Monocytes Relative 7 %   Monocytes Absolute 0.7 0.1 - 1.0 K/uL   Eosinophils Relative 1 %   Eosinophils Absolute 0.1 0.0 - 0.5 K/uL    Basophils Relative 1 %   Basophils Absolute 0.1 0.0 - 0.1 K/uL   Immature Granulocytes 1 %   Abs Immature Granulocytes 0.05 0.00 - 0.07 K/uL  Comprehensive metabolic panel     Status: Abnormal   Collection Time: 11/24/21  9:33 PM  Result Value Ref Range   Sodium 137 135 - 145 mmol/L   Potassium 3.9 3.5 - 5.1 mmol/L   Chloride 104 98 -  111 mmol/L   CO2 21 (L) 22 - 32 mmol/L   Glucose, Bld 304 (H) 70 - 99 mg/dL   BUN 23 8 - 23 mg/dL   Creatinine, Ser 8.33 0.61 - 1.24 mg/dL   Calcium 8.9 8.9 - 82.5 mg/dL   Total Protein 6.5 6.5 - 8.1 g/dL   Albumin 3.2 (L) 3.5 - 5.0 g/dL   AST 12 (L) 15 - 41 U/L   ALT 15 0 - 44 U/L   Alkaline Phosphatase 68 38 - 126 U/L   Total Bilirubin 0.4 0.3 - 1.2 mg/dL   GFR, Estimated >05 >39 mL/min   Anion gap 12 5 - 15  Magnesium     Status: None   Collection Time: 11/24/21  9:33 PM  Result Value Ref Range   Magnesium 1.7 1.7 - 2.4 mg/dL  Troponin I (High Sensitivity)     Status: None   Collection Time: 11/24/21  9:33 PM  Result Value Ref Range   Troponin I (High Sensitivity) 6 <18 ng/L  Lipase, blood     Status: None   Collection Time: 11/24/21  9:33 PM  Result Value Ref Range   Lipase 41 11 - 51 U/L  I-Stat venous blood gas, [facility] ED only)     Status: Abnormal   Collection Time: 11/24/21 10:18 PM  Result Value Ref Range   pH, Ven 7.466 (H) 7.25 - 7.43   pCO2, Ven 33.9 (L) 44 - 60 mmHg   pO2, Ven 193 (H) 32 - 45 mmHg   Bicarbonate 24.4 20.0 - 28.0 mmol/L   TCO2 25 22 - 32 mmol/L   O2 Saturation 100 %   Acid-Base Excess 1.0 0.0 - 2.0 mmol/L   Sodium 136 135 - 145 mmol/L   Potassium 3.8 3.5 - 5.1 mmol/L   Calcium, Ion 1.06 (L) 1.15 - 1.40 mmol/L   HCT 43.0 39.0 - 52.0 %   Hemoglobin 14.6 13.0 - 17.0 g/dL   Sample type VENOUS   POC CBG, ED     Status: Abnormal   Collection Time: 11/24/21 10:48 PM  Result Value Ref Range   Glucose-Capillary 254 (H) 70 - 99 mg/dL  Troponin I (High Sensitivity)     Status: None   Collection Time: 11/25/21   8:06 AM  Result Value Ref Range   Troponin I (High Sensitivity) 6 <18 ng/L  Hemoglobin A1c     Status: Abnormal   Collection Time: 11/25/21  8:06 AM  Result Value Ref Range   Hgb A1c MFr Bld 9.7 (H) 4.8 - 5.6 %   Mean Plasma Glucose 231.69 mg/dL  CK     Status: Abnormal   Collection Time: 11/25/21  8:06 AM  Result Value Ref Range   Total CK 20 (L) 49 - 397 U/L  CBG monitoring, ED     Status: Abnormal   Collection Time: 11/25/21  8:14 AM  Result Value Ref Range   Glucose-Capillary 245 (H) 70 - 99 mg/dL  CBG monitoring, ED     Status: Abnormal   Collection Time: 11/25/21 12:31 PM  Result Value Ref Range   Glucose-Capillary 259 (H) 70 - 99 mg/dL  Vitamin J67     Status: None   Collection Time: 11/25/21 12:59 PM  Result Value Ref Range   Vitamin B-12 353 180 - 914 pg/mL  Hepatitis panel, acute     Status: None   Collection Time: 11/25/21 12:59 PM  Result Value Ref Range   Hepatitis B Surface Ag NON REACTIVE  NON REACTIVE   HCV Ab NON REACTIVE NON REACTIVE   Hep A IgM NON REACTIVE NON REACTIVE   Hep B C IgM NON REACTIVE NON REACTIVE  Folate     Status: None   Collection Time: 11/25/21 12:59 PM  Result Value Ref Range   Folate 14.8 >5.9 ng/mL  Sedimentation rate     Status: Abnormal   Collection Time: 11/25/21 12:59 PM  Result Value Ref Range   Sed Rate 28 (H) 0 - 16 mm/hr  CBG monitoring, ED     Status: Abnormal   Collection Time: 11/25/21  5:07 PM  Result Value Ref Range   Glucose-Capillary 255 (H) 70 - 99 mg/dL   No results found for this or any previous visit (from the past 240 hour(s)). Creatinine: Recent Labs    11/24/21 2133  CREATININE 0.68    Impression/Assessment:  Lower extremity weakness Concern for prosthetic interaction with MRI  Plan:  Recommend proceeding with MRI using recommended precautions for the AMS malleable 600.  Recommend against removal.  This can cause complications including bleeding, infection, injury to surrounding structures, penile  shortening, buried penis, among other imponderables.  Marton Redwood, III 11/25/2021, 7:13 PM

## 2021-11-25 NOTE — ED Provider Notes (Signed)
Accepted handoff at shift change from PB PA-C. Please see prior provider note for more detail.   Briefly: Patient is 76 y.o.   Per prior provider  "Please see her associated note for further insight into the patient's ED course.  In brief, Patient is a 76 year old male with history of RA, polyneuropathy, and chronic polymyalgia who presents with concern for progressively worsening weakness over the last 3 to 4 months having been seen in the outpatient setting multiple times by his rheumatologist and currently undergoing PT and home health evaluation.  He presents to the emergency department today for concern for progressive worsening left lower extremity weakness in addition to chronic right lower extremity weakness.  Was in the past using a cane, however in the past week has declined from being able to use a walker now he is wheelchair-bound.  Today he was unable to rise even from his recliner despite the assistance of 2 separate family members which is new for him.  He is been seen by rheum and orthopedics multiple times for same presentation without clear etiology."      Physical Exam  BP (!) 147/103   Pulse (!) 103   Temp 98.2 F (36.8 C) (Oral)   Resp 18   SpO2 98%   Physical Exam  Procedures  Procedures  ED Course / MDM   Clinical Course as of 11/25/21 1721  Sun Nov 25, 2021  0254 Repeat consult to hospitalist Dr. Margo Aye who does not feel patient will require admission but he is agreeable to consulting on the patient in the emergency department.  I appreciate her collaboration in care of this patient. [RS]  [phone] Hospitalist, Dr. Margo Aye has examined this patient and feels he does not meet admission criteria. Will discuss dispo desire with patient and family.  [RS]  0805 I spoke with hospitalist Dr. Ophelia Charter to request that patient receive MRI imaging, PT evaluation, and OT evaluation in the emergency department before any admission for this patient. [PB]  [id number] Patient is unable to  receive MRI due to his penile implant.  Attending patient Dr. Rubin Payor spoke with radiology who confirmed this.  I spoke with Dr. Durene Cal with neurology team to see if there is any other imaging that patient needed at this time.  Dr. Durene Cal reached out to Dr. Otelia Limes, and reported that Dr. Otelia Limes had a prolonged conversation with patient about the penile implant.  Patient is willing to have the penile implant removed as he no longer needs it.  I will reach out to urology for further recommendations on removal of the penile implant. [PB]  [phone] I spoke to Dr. Alvester Morin who reviewed patient's case and make recommendations on possible surgery to remove penile implant. [PB]  1106 Patient ate breakfast and therefore is not a candidate for urology to perform surgery today.  As patient cannot get MRIs at this time neurology recommended CT imaging.  CT imaging was placed.  We will reconsult neurology after imaging is obtained. [PB]  1440 I spoke to Dr. Thomasena Edis with neurology who will make further recommendations on the need for MRI after reviewing his CT imaging. [PB]  1512 Weakness progressive to unable to walk. Neurology recommended MRI and admission.   Bell ? May take out implant for MRI.   Collins will look at CT and call back.  [WF]    Clinical Course User Index [PB] Haskel Schroeder, PA-C [RS] Sponseller, Eugene Gavia, PA-C [WF] Gailen Shelter, [place]   Medical Decision Making Amount  and/or Complexity of Data Reviewed Labs: ordered. Radiology: ordered.  Risk OTC drugs. Prescription drug management. Decision regarding hospitalization.   Discussed with Dr. Thomasena Edis who recommends admission for follow up on labs, obtain MRI (C,T,L spine with and without) and PT/OT  5:22 PM Dr. Alvester Morin - aware of pt. Will talk to scheduler about surgery.   5:29 PM - Discussed with hospitalist who will see pt for admission.      Gailen Shelter, [place] 11/25/21 2022    Charlynne Pander, MD 11/25/21 [phone]

## 2021-11-25 NOTE — Plan of Care (Signed)
Neurology Plan of Care  Larri M Rubert MR# [id number] 11/25/2021  The patient has penile implant from the 1980s and not clear if MRI safe. Urology evaluated for removal however the patient ate breakfast and surgery cannot be done.  Due to weakness without sensory level or upper motor neuron signs in setting of atrophy; ideally, he will need outpatient NCS/EMG with Dr. Yan at [facility].  As long as he is here we will revise plan to add: CT with contrast thoracic and lumbar spine. Labs: ESR/CRP SPAP with IFE Hepatits panel HIV 1/2 Folate B1 B6 B12 Vitamin E  Pending CT results may consider MRI or LP   Electronically signed by:   , MD Page: [phone] 11/25/2021, 11:13 AM    

## 2021-11-25 NOTE — Consult Note (Addendum)
NEURO HOSPITALIST CONSULT NOTE   Requestig physician: Dr. Wilkie Aye  Reason for Consult: Progressive BLE weakness over a 2 year period, with recently accelerating rate of decline.   History obtained from:   Patient and Chart     HPI:                                                                                                                                          William Hunter is an 76 y.o. male with a PMHx of arthritis, CAD, dyslipidemia, DVT headache, HTN, migraines, rheumatoid arthritis and DM2 presenting to the ED with progressive BLE weakness over a 2 year period, with recently accelerating rate of decline. Weakness has become so severe that he has recently begun to fall with knees buckling when trying to get up out of his recliner. He has been confined to his recliner due to this for the past 2 weeks. Denies saddle anesthesia or incontinence.   CT head obtained in the ED (images personally reviewed): 1. No acute intracranial hemorrhage or infarct. 2. Stable ventriculomegaly, slightly disproportionate to the degree of parenchymal volume loss suggesting either asymmetric central atrophy or communicating hydrocephalus. 3. Stable periventricular white matter changes, likely reflecting the sequela of small vessel ischemia.  Past Medical History:  Diagnosis Date   Anginal pain ([name]    Arthritis    "fingers" (03/09/2012)   Cold feet    "from my diabetes; they stay cold" (03/09/2012)   Coronary artery disease    Coronary artery spasm [facility])    since age 68 Dr Aleen Campi   COVID 07/06/2019   hospitalizied   Dyslipidemia    Femoral DVT (deep venous thrombosis) ([name]    left   Headache(784.0)    Hx of gallstones    Dr Derrell Lolling lap cholecystectomy   Hx of plastic surgery    plastic surgery following a fall off a loading dock ,lost  all his upper teeth    Hypertension    MI, old    at age 5   Migraines    Pneumonia ~ 1962; 1970's   "double"; "regular"  (03/09/2012)   Rheumatoid arthritis ([name]    Dr Dierdre Forth    Slipped cervical disc 06/10/2006   Dr Patsi Sears in the past/ Dr Larita Fife , chiropractor in 2008   Thyroid disease    Dr Lucianne Muss   Type II diabetes mellitus [facility])     Past Surgical History:  Procedure Laterality Date   CARDIAC CATHETERIZATION     x 3 with NO stents    CHOLECYSTECTOMY  03/11/2012   Procedure: LAPAROSCOPIC CHOLECYSTECTOMY WITH INTRAOPERATIVE CHOLANGIOGRAM;  Surgeon: Ernestene Mention, MD;  Location: [facility] OR;  Service: General;  Laterality: N/A;   KYPHOPLASTY N/A 05/30/2021   Procedure: THORACIC FIVE KYPHOPLASTY;  Surgeon:  Coletta Memos, MD;  Location: [facility] OR;  Service: Neurosurgery;  Laterality: N/A;  THORACIC FIVE KYPHOPLASTY   PENILE PROSTHESIS IMPLANT  1983    Family History  Problem Relation Age of Onset   Heart failure Mother    Cancer - Other Mother        breast   Heart disease Father    Cancer - Other Sister        tongue cancer   Heart attack Sister              Social History:  reports that he has never smoked. He has never used smokeless tobacco. He reports that he does not drink alcohol and does not use drugs.  Allergies  Allergen Reactions   Codeine Swelling   Penicillins Hives and Swelling    Has patient had a PCN reaction causing immediate rash, facial/tongue/throat swelling, SOB or lightheadedness with hypotension: Yes Has patient had a PCN reaction causing severe rash involving mucus membranes or skin necrosis: No Has patient had a PCN reaction that required hospitalization No Has patient had a PCN reaction occurring within the last 10 years: No If all of the above answers are "NO", then may proceed with Cephalosporin use.    Prednisone Hives   Wool Alcohol [Lanolin] Hives   Empagliflozin Nausea And Vomiting    MEDICATIONS:                                                                                                                     Current Facility-Administered Medications on File  Prior to Encounter  Medication Dose Route Frequency Provider Last Rate Last Admin   Study - ORION 4 - inclisiran 300 mg/1.89mL or placebo SQ injection (PI-Stuckey)  300 mg Subcutaneous Q6 months Herby Abraham, MD       Current Outpatient Medications on File Prior to Encounter  Medication Sig Dispense Refill   diltiazem (TIAZAC) 300 MG 24 hr capsule Take 1 capsule (300 mg total) by mouth daily. Pt. Needs to schedule overdue appt. With Dr. Anne Fu in order to receive future refills. Thank You. 2nd Attempt. 15 capsule 0   folic acid (FOLVITE) 1 MG tablet Take 1 mg by mouth daily.     HUMIRA PEN 40 MG/0.4ML PNKT Inject 0.4 mLs into the muscle every 14 (fourteen) days.     Insulin Glargine (BASAGLAR KWIKPEN) 100 UNIT/ML Inject 30 Units into the skin at bedtime.     insulin lispro (HUMALOG) 100 UNIT/ML KwikPen Inject 20-35 Units into the skin with breakfast, with lunch, and with evening meal.     loratadine (CLARITIN) 10 MG tablet Take 10 mg by mouth daily as needed for allergies.     methotrexate (RHEUMATREX) 2.5 MG tablet Take 15 mg by mouth every Wednesday. Caution:Chemotherapy. Protect from light.     NOVOLOG 100 UNIT/ML injection Inject 20-35 Units into the skin See admin instructions. Per sliding scale     Study - ORION 4 - inclisiran 300 mg/1.36mL or placebo SQ injection (  PI-Stuckey) Inject 1.5 mLs (300 mg total) into the skin every 6 (six) months. 1 mL 1      ROS:                                                                                                                                       As per HPI. Does not endorse any additional complaints.    Blood pressure (!) 163/82, pulse 67, temperature 98.2 F (36.8 C), temperature source Oral, resp. rate 16, SpO2 97 %.   General Examination:                                                                                                       Physical Exam  HEENT-  [place]/AT    Lungs- Respirations unlabored Extremities- No  edema   Neurological Examination Mental Status: Awake, alert and oriented. Speech fluent without evidence of aphasia.  Able to follow all commands without difficulty. Cranial Nerves: II: Temporal visual fields intact with no extinction to DSS. PERRL.  III,IV, VI: No ptosis. EOMI. No nystagmus V: Temp and FT sensation equal bilaterally.  VII: Smile symmetric VIII: Hearing intact to voice IX,X: No hypophonia or hoarseness XI: Symmetric shoulder shrug XII: Midline tongue extension. No tongue atrophy or fasciculations seen. Motor: Decreased muscle bulk x 4, with a proximal-distal gradient of increasing atrophy, worse more distally, including hand intrinsics and muscles of the distal lower extremities below the knees and including the feet bilaterally. No fasciculations seen.  BUE strength is 4/5 proximally and 4-/5 distally, including deltoids, biceps, triceps, grip, finger extension, finger abduction and pincer grasp LLE: 4/5 hip flexion, knee extension, knee flexion; 3-4/5 ADF and APF RLE 3/5 hip flexion, 4-/5 knee extension, knee flexion, 3/5 ADF and APF Sensory: Temp and light touch intact in upper and lower extremities proximally and distally including the hands and feet. Mild proprioceptive deficit to toes bilaterally.  Deep Tendon Reflexes: 1+ bilateral brachioradialis, biceps and patellae. 0 bilateral achilles.  Plantars: Mute bilaterally  Cerebellar: No ataxia with FNF bilaterally. No rest or action tremor noted.  Gait: Unable to assess due to weakness   Lab Results: Basic Metabolic Panel: Recent Labs  Lab 11/24/21 2133 11/24/21 2218  NA 137 136  K 3.9 3.8  CL 104  --   CO2 21*  --   GLUCOSE 304*  --   BUN 23  --   CREATININE 0.68  --   CALCIUM 8.9  --   MG 1.7  --  CBC: Recent Labs  Lab 11/24/21 2133 11/24/21 2218  WBC 11.0*  --   NEUTROABS 8.3*  --   HGB 14.7 14.6  HCT 43.6 43.0  MCV 93.6  --   PLT 263  --     Cardiac Enzymes: No results for input(s):  "CKTOTAL", "CKMB", "CKMBINDEX", "TROPONINI" in the last 168 hours.  Lipid Panel: No results for input(s): "CHOL", "TRIG", "HDL", "CHOLHDL", "VLDL", "LDLCALC" in the last 168 hours.  Imaging: DG Pelvis 1-2 Views  Result Date: 11/24/2021 CLINICAL DATA:  Weakness, fall EXAM: PELVIS - 1-2 VIEW COMPARISON:  05/14/2021 FINDINGS: There is no evidence of pelvic fracture or diastasis. 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 Bones are demineralized. IMPRESSION: Negative. Electronically Signed   By: Duanne Guess D.O.   On: 11/24/2021 21:52   DG Chest 2 View  Result Date: 11/24/2021 CLINICAL DATA:  Weakness, fall EXAM: CHEST - 2 VIEW COMPARISON:  07/02/2019 FINDINGS: The heart size and mediastinal contours are within normal limits. Low lung volumes. Streaky left basilar opacity. Right lung is clear. No pleural effusion or pneumothorax. The visualized skeletal structures are unremarkable. IMPRESSION: Low lung volumes with streaky left basilar opacity, atelectasis versus infiltrate. Electronically Signed   By: Duanne Guess D.O.   On: 11/24/2021 21:51   CT HEAD WO CONTRAST ( )  Result Date: 11/24/2021 CLINICAL DATA:  Transient ischemic attack EXAM: CT HEAD WITHOUT CONTRAST TECHNIQUE: Contiguous axial images were obtained from the base of the skull through the vertex without intravenous contrast. RADIATION DOSE REDUCTION: This exam was performed according to the departmental dose-optimization program which includes automated exposure control, adjustment of the mA and/or kV according to patient size and/or use of iterative reconstruction technique. COMPARISON:  05/14/2021 FINDINGS: Brain: Normal anatomic configuration. Parenchymal volume loss is commensurate with the patient's age. Moderate periventricular white matter changes are present likely reflecting the sequela of small vessel ischemia. No abnormal intra or extra-axial mass lesion or fluid collection. No  abnormal mass effect or midline shift. No evidence of acute intracranial hemorrhage or infarct. Mild ventriculomegaly appears stable since prior examination and is slightly disproportionate to the degree of parenchymal volume loss suggesting either asymmetric central atrophy or communicating hydrocephalus. Cerebellum unremarkable. Vascular: No asymmetric hyperdense vasculature at the skull base. Skull: Intact Sinuses/Orbits: Paranasal sinuses are clear. Ocular lenses have been removed. Orbits are otherwise unremarkable. Other: Mastoid air cells and middle ear cavities are clear. IMPRESSION: 1. No acute intracranial hemorrhage or infarct. 2. Stable ventriculomegaly, slightly disproportionate to the degree of parenchymal volume loss suggesting either asymmetric central atrophy or communicating hydrocephalus. 3. Stable periventricular white matter changes, likely reflecting the sequela of small vessel ischemia. Electronically Signed   By: Helyn Numbers M.D.   On: 11/24/2021 21:14     Assessment: 76 year old male with progressive BLE weakness over a 2 year period, with recently accelerating rate of decline.  1. Exam reveals diffuse limb weakness, worse in the lower extremities than upper extremities, seen in conjunction with diffuse muscle atrophy, worse distally. His leg weakness is asymmetric, right worse than left. No significant sensory loss.  2. CT head: No acute intracranial hemorrhage or infarct. Stable ventriculomegaly, slightly disproportionate to the degree of parenchymal volume loss but overall not militating in favor of NPH. Stable periventricular white matter changes, likely reflecting the sequela of small vessel ischemia. 3. Exam findings best localize to muscle. DDx includes a myelopathy (compressive, inflammatory), inflammatory myositis, severe deconditioning due to decreased mobility, a diffuse motor  neuropathy or multifactorial etiology. He has depressed reflexes but they are only absent at the  achilles tendons, which would not be unusual for an elderly patient with diabetes. No respiratory symptoms, bulbar weakness or fasciculations to suggest ALS.   Recommendations: 1. PT and OT evaluations 2. MRI of brain and cervical spine with and without contrast (ordered) 3. May need a muscle biopsy.  4. CK level (ordered) 5. [facility] myositis panel (ordered) 6. May need outpatient EMG/NCS at [facility] after discharge (cannot be performed inpatient)    Electronically signed: Dr. Caryl Pina 11/25/2021, 7:52 AM

## 2021-11-25 NOTE — ED Notes (Signed)
Pt's family member came up to nurse's station requesting a diet coke for the pt. This RN educated pt's family on POC. Pt's family member returned to room.

## 2021-11-25 NOTE — Progress Notes (Signed)
Inpatient Rehab Admissions Coordinator Note:   Per PT patient was screened for CIR candidacy by Laneka Mcgrory Luvenia Starch, CCC-SLP. Note pt is not inpatient status. If status were to change, CIR admissions team will screen for candidacy.   Wolfgang Phoenix, MS, CCC-SLP Admissions Coordinator [phone] 11/25/21 5:30 PM

## 2021-11-25 NOTE — ED Notes (Signed)
Dr Bell at bedside.

## 2021-11-25 NOTE — Consult Note (Signed)
Initial Consultation Note   Patient: William Hunter EXB:[id number] DOB: 08-23-1945 PCP: Irena Reichmann, DO DOA: 11/24/2021 DOS: the patient was seen and examined on 11/25/2021 Primary service: Shon Baton, MD  Referring physician: Santa Lighter, PA-EDP Reason for consult: Global weakness  Assessment/Plan: Global weakness/rheumatoid arthritis of multiple sites/primary osteoarthritis of both knees -Recommend close follow-up with rheumatology outpatient -Continue home PT OT as planned, 4 days a week instead of 2 days/week -Take your medications as prescribed by your providers. -Continue fall precautions.  Type 2 diabetes with hyperglycemia Last hemoglobin A1c 8.7 on 03/09/2012 Resume home regimen Follow-up with your PCP and repeat hemoglobin A1c outpatient.   TRH will sign off at present, please call us again when needed.    HPI: William Hunter is a 76 y.o. male with past medical history of rheumatoid arthritis of multiple sites on Humira followed by rheumatology at [facility], primary osteoarthritis of both knees followed by orthopedic surgery, type 2 diabetes, hypertension, hyperlipidemia, coronary artery disease, chronic diastolic CHF, who presented to [facility] ED with complaints of progressive chronic right-sided weakness of 2 months duration.  The patient is receiving PT OT at home.  A few days, his insurance approved for more therapies at home instead of 2 days a week, he will be getting 4 days/week of therapy, double with OT and PT.  Per medical record review, his rheumatologist is aware of his decline.  His wife and daughter brought him in to the ED at [facility] hoping that he will be able to go to inpatient rehab.  While in the ED, vital signs and lab results are stable.  The patient is in no acute distress.  Responding to questions appropriately.  Updated the patient's wife and daughter at bedside.  Recommended to follow-up with rheumatologist outpatient and to continue therapies at  home as planned, 4 days/week instead of 2 days/week.  Review of Systems: As mentioned in the history of present illness. All other systems reviewed and are negative. Past Medical History:  Diagnosis Date   Anginal pain ([name]    Arthritis    "fingers" (03/09/2012)   Cold feet    "from my diabetes; they stay cold" (03/09/2012)   Coronary artery disease    Coronary artery spasm [facility])    since age 89 Dr Aleen Campi   COVID 07/06/2019   hospitalizied   Dyslipidemia    Femoral DVT (deep venous thrombosis) ([name]    left   Headache(784.0)    Hx of gallstones    Dr Derrell Lolling lap cholecystectomy   Hx of plastic surgery    plastic surgery following a fall off a loading dock ,lost  all his upper teeth    Hypertension    MI, old    at age 63   Migraines    Pneumonia ~ 1962; 1970's   "double"; "regular" (03/09/2012)   Rheumatoid arthritis ([name]    Dr Dierdre Forth    Slipped cervical disc 06/10/2006   Dr Patsi Sears in the past/ Dr Larita Fife , chiropractor in 2008   Thyroid disease    Dr Lucianne Muss   Type II diabetes mellitus [facility])    Past Surgical History:  Procedure Laterality Date   CARDIAC CATHETERIZATION     x 3 with NO stents    CHOLECYSTECTOMY  03/11/2012   Procedure: LAPAROSCOPIC CHOLECYSTECTOMY WITH INTRAOPERATIVE CHOLANGIOGRAM;  Surgeon: Ernestene Mention, MD;  Location: [facility] OR;  Service: General;  Laterality: N/A;   KYPHOPLASTY N/A 05/30/2021   Procedure: THORACIC FIVE KYPHOPLASTY;  Surgeon: Coletta Memos, MD;  Location: [facility] OR;  Service: Neurosurgery;  Laterality: N/A;  THORACIC FIVE KYPHOPLASTY   PENILE PROSTHESIS IMPLANT  1983   Social History:  reports that he has never smoked. He has never used smokeless tobacco. He reports that he does not drink alcohol and does not use drugs.  Allergies  Allergen Reactions   Codeine Swelling   Penicillins Hives and Swelling    Has patient had a PCN reaction causing immediate rash, facial/tongue/throat swelling, SOB or lightheadedness with hypotension:  Yes Has patient had a PCN reaction causing severe rash involving mucus membranes or skin necrosis: No Has patient had a PCN reaction that required hospitalization No Has patient had a PCN reaction occurring within the last 10 years: No If all of the above answers are "NO", then may proceed with Cephalosporin use.    Prednisone Hives   Wool Alcohol [Lanolin] Hives   Empagliflozin Nausea And Vomiting    Family History  Problem Relation Age of Onset   Heart failure Mother    Cancer - Other Mother        breast   Heart disease Father    Cancer - Other Sister        tongue cancer   Heart attack Sister     Prior to Admission medications   Medication Sig Start Date End Date Taking? Authorizing Provider  acetaminophen (TYLENOL) 500 MG tablet Take 1,000 mg by mouth every 6 (six) hours as needed for moderate pain.    [provider]  diltiazem (TIAZAC) 300 MG 24 hr capsule Take 1 capsule (300 mg total) by mouth daily. Pt. Needs to schedule overdue appt. With Dr. Anne Fu in order to receive future refills. Thank You. 2nd Attempt. 11/23/21   Jake Bathe, MD  folic acid (FOLVITE) 1 MG tablet Take 1 mg by mouth daily.    [provider]  HYDROcodone-acetaminophen (NORCO/VICODIN) 5-325 MG tablet Take 1 tablet by mouth every 6 (six) hours as needed for moderate pain.    [provider]  Insulin Glargine (BASAGLAR KWIKPEN) 100 UNIT/ML Inject 30 Units into the skin at bedtime.    [provider]  insulin lispro (HUMALOG) 100 UNIT/ML KwikPen Inject 20-35 Units into the skin with breakfast, with lunch, and with evening meal.    [provider]  loratadine (CLARITIN) 10 MG tablet Take 10 mg by mouth daily as needed for allergies.    [provider]  meclizine (ANTIVERT) 25 MG tablet Take 25 mg by mouth 2 (two) times daily. 08/01/21   [provider]  methotrexate (RHEUMATREX) 2.5 MG tablet Take 15 mg by mouth every Wednesday.  Caution:Chemotherapy. Protect from light.    [provider]  Study - ORION 4 - inclisiran 300 mg/1.82mL or placebo SQ injection (PI-Stuckey) Inject 1.5 mLs (300 mg total) into the skin every 6 (six) months. 09/06/21   Herby Abraham, MD    Physical Exam: Vitals:   11/25/21 0400 11/25/21 0415 11/25/21 0430 11/25/21 0445  BP: 134/77 (!) 145/74 (!) 142/76 (!) 155/84  Pulse: 74 73 70 74  Resp: 19 18 17 19   Temp:      TempSrc:      SpO2: 97% 97% 97% 98%   The patient is well-developed well-nourished in no acute distress.  He is alert and responds to questions appropriately. Regular rate and rhythm no rubs or gallops. Clear to auscultation no wheezes or rales No tenderness with abdominal palpation.  Bowel sounds present. 4  out of 5 strength in right lower extremity. Mood is appropriate for condition and setting.   Family Communication: Updated the patient's wife and daughter at bedside. Primary team communication: Updated EDP via secure chat.   Thank you very much for involving [place] in the care of your patient.  Author: Darlin Drop, DO 11/25/2021 5:07 AM  For on call review [email or web address].

## 2021-11-25 NOTE — Evaluation (Signed)
Physical Therapy Evaluation Patient Details Name: William Hunter MRN: [id number] DOB: 1946-05-27 Today's Date: 11/25/2021  History of Present Illness  76 y.o. male presents to [facility] hospital 11/24/2021 with progressive weakness over the last 3-4 months, unable to ambulate. PMH includes OA, CAD, HTN, RA, DMII.  Clinical Impression  Pt presents to PT with deficits in functional mobility, gait, balance, strength, endurance, strength, power, motor control. Pt with generalized weakness, more significant in lower extremities than upper. Pt with difficulty maintaining sitting balance this session although ED bed contributing and forcing pt into R lateral lean. Pt appears to have increased RLE extensor tone with lone attempt at transfer initiation, R leg sliding into knee extension. Pt is at a high risk for falls and requires significant assistance to mobilize at this time. PT recommends AIR admission.       Recommendations for follow up therapy are one component of a multi-disciplinary discharge planning process, led by the attending physician.  Recommendations may be updated based on patient status, additional functional criteria and insurance authorization.  Follow Up Recommendations Acute inpatient rehab (3hours/day)    Assistance Recommended at Discharge Frequent or constant Supervision/Assistance  Patient can return home with the following  Two people to help with walking and/or transfers;Two people to help with bathing/dressing/bathroom;Assistance with cooking/housework;Assist for transportation;Help with stairs or ramp for entrance    Equipment Recommendations Other (comment);Hospital bed (hoyer lift)  Recommendations for Other Services  Rehab consult    Functional Status Assessment Patient has had a recent decline in their functional status and demonstrates the ability to make significant improvements in function in a reasonable and predictable amount of time.     Precautions / Restrictions  Precautions Precautions: Fall Restrictions Weight Bearing Restrictions: No      Mobility  Bed Mobility Overal bed mobility: Needs Assistance Bed Mobility: Rolling, Supine to Sit, Sit to Supine Rolling: Mod assist   Supine to sit: Max assist, HOB elevated Sit to supine: Max assist, HOB elevated   General bed mobility comments: use fo bed rails    Transfers                   General transfer comment: attempted to initiate however not safe as pt's RLE sliding into extension and pt unable to maintain balance sitting on edge of ED bed (bed unable to flatten totally leaving pt leaned laterally to right side)    Ambulation/Gait                  Stairs            Wheelchair Mobility    Modified Rankin (Stroke Patients Only)       Balance Overall balance assessment: Needs assistance Sitting-balance support: Single extremity supported, Bilateral upper extremity supported, Feet unsupported Sitting balance-Leahy Scale: Poor Sitting balance - Comments: mod-maxA, bed not flat despite PT attempts. Bed placing pt in R lateral lean Postural control: Right lateral lean                                   Pertinent Vitals/Pain Pain Assessment Pain Assessment: 0-10 Pain Score: 4  Pain Location: BLE Pain Descriptors / Indicators: Aching Pain Intervention(s): Monitored during session    Home Living Family/patient expects to be discharged to:: Private residence Living Arrangements: Spouse/significant other Available Help at Discharge: Family;Available 24 hours/day Type of Home: House Home Access: Stairs to enter Entrance Stairs-Rails: None Entrance  Stairs-Number of Steps: 1   Home Layout: One level Home Equipment: Agricultural consultant (2 wheels);Cane - single point;Wheelchair - manual;Toilet riser      Prior Function Prior Level of Function : Needs assist             Mobility Comments: pt reports he was able to ambulate with a cane 3 months  ago, has declined gradually and is now unable to stand       Hand Dominance        Extremity/Trunk Assessment   Upper Extremity Assessment Upper Extremity Assessment: Generalized weakness    Lower Extremity Assessment Lower Extremity Assessment: Generalized weakness;RLE deficits/detail (grossly 3+/5 BLE) RLE Coordination: decreased gross motor (appears to have extensor tone with attempt at sit to stand)    Cervical / Trunk Assessment Cervical / Trunk Assessment: Normal  Communication   Communication: No difficulties  Cognition Arousal/Alertness: Awake/alert Behavior During Therapy: WFL for tasks assessed/performed Overall Cognitive Status: Within Functional Limits for tasks assessed                                          General Comments General comments (skin integrity, edema, etc.): VSS on RA, pt incontinent of urine upon PT arrival. Pt reports he is able to tell when he has to urinate but at times has increased urgency    Exercises     Assessment/Plan    PT Assessment Patient needs continued PT services  PT Problem List Decreased strength;Decreased activity tolerance;Decreased balance;Decreased mobility;Impaired tone       PT Treatment Interventions Gait training;DME instruction;Functional mobility training;Therapeutic activities;Therapeutic exercise;Balance training;Neuromuscular re-education;Cognitive remediation;Wheelchair mobility training;Patient/family education    PT Goals (Current goals can be found in the Care Plan section)  Acute Rehab PT Goals Patient Stated Goal: to regain strength and mobility PT Goal Formulation: With patient Time For Goal Achievement: 12/09/21 Potential to Achieve Goals: Fair    Frequency Min 3X/week     Co-evaluation               AM-PAC PT "6 Clicks" Mobility  Outcome Measure Help needed turning from your back to your side while in a flat bed without using bedrails?: A Lot Help needed moving from  lying on your back to sitting on the side of a flat bed without using bedrails?: A Lot Help needed moving to and from a bed to a chair (including a wheelchair)?: A Lot Help needed standing up from a chair using your arms (e.g., wheelchair or bedside chair)?: Total Help needed to walk in hospital room?: Total Help needed climbing 3-5 steps with a railing? : Total 6 Click Score: 9    End of Session   Activity Tolerance: Patient tolerated treatment well Patient left: in bed;with call bell/phone within reach Nurse Communication: Mobility status;Need for lift equipment PT Visit Diagnosis: Other abnormalities of gait and mobility (R26.89);Muscle weakness (generalized) (M62.81);Other symptoms and signs involving the nervous system (R29.898)    Time: 0086-7619 PT Time Calculation (min) (ACUTE ONLY): 25 min   Charges:   PT Evaluation $PT Eval Low Complexity: 1 Low          Arlyss Gandy, PT, DPT Acute Rehabilitation Office [phone]   Arlyss Gandy 11/25/2021, 11:53 AM

## 2021-11-25 NOTE — H&P (Signed)
[facility] History and Physical  TOBIAS AVITABILE INO:[id number] DOB: [birth year] DOA: 11/24/2021 PCP: [name], DO  Admitted from: Home Chief Complaint: Progressive bilateral lower extremity weakness  History of Present Illness: CRAVEN CREAN is a 76 y.o. male with PMH of rheumatoid arthritis of multiple sites on Humira followed by rheumatology at [facility], primary osteoarthritis of both knees followed by orthopedic surgery, type 2 diabetes, hypertension, hyperlipidemia, coronary artery disease, chronic diastolic CHF. Patient was brought to the ED by EMS on 6/17 with complaint of progressive chronic right-sided weakness.  He has progressive weakness over 2-year period with recently accelerating rate of decline.  His weakness has become so severe that he has recently begun to follow with knees buckling when trying to get out of bed to recliner.  He has been confined to his recliner due to this for past 2 weeks.  No bowel/bladder incontinence.  He receiving PT OT at home and was in process of getting more hours of physical therapy at home.  Because of accelerating decline, his family brought him to the ED with a hope to get him to CIR.  In the ED, he was hemodynamically stable Initial labs with unremarkable CBC and BMP except for elevated blood glucose to 300s. CT head did not show any acute intracranial abnormality.  Patient was seen by neurology Dr. Cheral Marker.  On his exam, patient noted to have diffuse limb weakness worse in the lower extremities, diffuse muscle atrophy. MRI brain was suggested could not be done because of penile implant in place. Urology Dr. Gloriann Loan was reached out who recommended removing penile implant as patient is no longer using it. Hospitalist service called for further medicine evaluation and management. Patient was seen by 2 hospitalist physicians since this morning.  It seems to me that the work-up was incomplete and plan was not definite at that time  and hence patient was not admitted earlier.  Review of Systems:  All systems were reviewed and were negative unless otherwise mentioned in the HPI   Past medical history: Past Medical History:  Diagnosis Date   Anginal pain ([place])    Arthritis    "fingers" (03/09/2012)   Cold feet    "from my diabetes; they stay cold" (03/09/2012)   Coronary artery disease    Coronary artery spasm [facility])    since age 38 Dr Glade Lloyd   COVID 07/06/2019   hospitalizied   Dyslipidemia    Femoral DVT (deep venous thrombosis) ([name]    left   Headache(784.0)    Hx of gallstones    Dr Dalbert Batman lap cholecystectomy   Hx of plastic surgery    plastic surgery following a fall off a loading dock ,lost  all his upper teeth    Hypertension    MI, old    at age 17   Migraines    Pneumonia ~ 1962; 1970's   "double"; "regular" (03/09/2012)   Rheumatoid arthritis ([place])    Dr Amil Amen    Slipped cervical disc 06/10/2006   Dr Gaynelle Arabian in the past/ Dr Roderic Scarce , chiropractor in 2008   Thyroid disease    Dr Dwyane Dee   Type II diabetes mellitus ([place])     Past surgical history: Past Surgical History:  Procedure Laterality Date   CARDIAC CATHETERIZATION     x 3 with NO stents    CHOLECYSTECTOMY  03/11/2012   Procedure: LAPAROSCOPIC CHOLECYSTECTOMY WITH INTRAOPERATIVE CHOLANGIOGRAM;  Surgeon: Adin Hector, MD;  Location: [place];  Service: General;  Laterality: N/A;   KYPHOPLASTY N/A 05/30/2021   Procedure: THORACIC FIVE KYPHOPLASTY;  Surgeon: Ashok Pall, MD;  Location: [place];  Service: Neurosurgery;  Laterality: N/A;  THORACIC FIVE KYPHOPLASTY   PENILE PROSTHESIS IMPLANT  1983    Social History:  reports that he has never smoked. He has never used smokeless tobacco. He reports that he does not drink alcohol and does not use drugs.  Allergies:  Allergies  Allergen Reactions   Codeine Swelling   Penicillins Hives and Swelling    Has patient had a PCN reaction causing immediate rash, facial/tongue/throat  swelling, SOB or lightheadedness with hypotension: Yes Has patient had a PCN reaction causing severe rash involving mucus membranes or skin necrosis: No Has patient had a PCN reaction that required hospitalization No Has patient had a PCN reaction occurring within the last 10 years: No If all of the above answers are "NO", then may proceed with Cephalosporin use.    Prednisone Hives   Wool Alcohol [Lanolin] Hives   Empagliflozin Nausea And Vomiting   Codeine, Penicillins, Prednisone, Wool alcohol [lanolin], and Empagliflozin   Family history:  Family History  Problem Relation Age of Onset   Heart failure Mother    Cancer - Other Mother        breast   Heart disease Father    Cancer - Other Sister        tongue cancer   Heart attack Sister      Home Meds: Prior to Admission medications   Medication Sig Start Date End Date Taking? Authorizing Provider  diltiazem (TIAZAC) 300 MG 24 hr capsule Take 1 capsule (300 mg total) by mouth daily. Pt. Needs to schedule overdue appt. With Dr. Marlou Porch in order to receive future refills. Thank You. 2nd Attempt. 11/23/21  Yes Jerline Pain, MD  folic acid (FOLVITE) 1 MG tablet Take 1 mg by mouth daily.   Yes [provider]  HUMIRA PEN 40 MG/0.4ML PNKT Inject 0.4 mLs into the muscle every 14 (fourteen) days. 10/03/21  Yes [provider]  insulin lispro (HUMALOG) 100 UNIT/ML KwikPen Inject 20-35 Units into the skin with breakfast, with lunch, and with evening meal.   Yes [provider]  loratadine (CLARITIN) 10 MG tablet Take 10 mg by mouth daily as needed for allergies.   Yes [provider]  methotrexate (RHEUMATREX) 2.5 MG tablet Take 15 mg by mouth every Wednesday. Caution:Chemotherapy. Protect from light.   Yes [provider]  NOVOLOG 100 UNIT/ML injection Inject 20-35 Units into the skin at bedtime. Per sliding scale 10/10/21  Yes [provider]  Study - ORION 4 - inclisiran 300 mg/1.73m or  placebo SQ injection (PI-Stuckey) Inject 1.5 mLs (300 mg total) into the skin every 6 (six) months. 09/06/21  Yes SHillary Bow MD  Insulin Glargine ([facility]) 100 UNIT/ML Inject 30 Units into the skin at bedtime. Patient not taking: Reported on 11/25/2021    [provider]    Physical Exam: Vitals:   11/25/21 1000 11/25/21 1302 11/25/21 1625 11/25/21 1643  BP: 134/79 (!) 149/101  (!) 147/103  Pulse: 86 100 99 (!) 103  Resp: '20 18 19 18  ' Temp:      TempSrc:      SpO2: 97% 98% 97% 98%   Wt Readings from Last 3 Encounters:  05/30/21 84.9 kg  05/14/21 79.4 kg  08/08/20 88.9 kg   There is no height or weight on file to calculate BMI.  General exam:  Not in distress Skin: No rashes, lesions or ulcers. HEENT: Atraumatic, normocephalic, no obvious bleeding Lungs: Clear to auscultation bilaterally CVS: Regular rate and rhythm, no murmur GI/Abd soft, nontender, nondistended, bowel sound present CNS: Alert, awake, oriented, speech fluent, motor exam per neurology note Psychiatry: Mood appropriate Extremities: No pedal edema, no calf tenderness.  Generalized muscle atrophy noted.     Consult Orders  (From admission, onward)           Start     Ordered   11/25/21 1659  Consult to hospitalist  Paged to [facility] by Kalman Shan  Once       Provider:  (Not yet assigned)  Question Answer Comment  Place call to: [facility]   Reason for Consult Admit      11/25/21 1658   11/25/21 0807  OT eval and treat  Routine        11/25/21 0806   11/25/21 0806  Patient needs PT consult as soon as possible.  Discharge is pending PT consult.  Please call  PT eval and treat  Imminent discharge       Comments: Patient needs PT consult as soon as possible.  Discharge is pending PT consult.  Please call  Question:  Reason for PT?  Answer:  Eval and Treat   11/25/21 0806   11/25/21 0742  Consult to hospitalist  Paged to [facility] by Kalman Shan  Once       Provider:  (Not yet assigned)   Question Answer Comment  Place call to: [facility]   Reason for Consult Admit      11/25/21 0741   11/25/21 0424  Consult to hospitalist  Paged [facility] by [place]  Once       Provider:  (Not yet assigned)  Question Answer Comment  Place call to: [facility]   Reason for Consult Admit      11/25/21 0423   11/24/21 2322  Consult to hospitalist  Pg by Darrick Meigs  Once       Provider:  (Not yet assigned)  Question Answer Comment  Place call to: [facility]   Reason for Consult Admit      11/24/21 2321            Labs on Admission:   CBC: Recent Labs  Lab 11/24/21 2133 11/24/21 2218  WBC 11.0*  --   NEUTROABS 8.3*  --   HGB 14.7 14.6  HCT 43.6 43.0  MCV 93.6  --   PLT 263  --     Basic Metabolic Panel: Recent Labs  Lab 11/24/21 2133 11/24/21 2218  NA 137 136  K 3.9 3.8  CL 104  --   CO2 21*  --   GLUCOSE 304*  --   BUN 23  --   CREATININE 0.68  --   CALCIUM 8.9  --   MG 1.7  --     Liver Function Tests: Recent Labs  Lab 11/24/21 2133  AST 12*  ALT 15  ALKPHOS 68  BILITOT 0.4  PROT 6.5  ALBUMIN 3.2*   Recent Labs  Lab 11/24/21 2133  LIPASE 41   No results for input(s): "AMMONIA" in the last 168 hours.  Cardiac Enzymes: Recent Labs  Lab 11/25/21 0806  CKTOTAL 20*    BNP (last 3 results) No results for input(s): "BNP" in the last 8760 hours.  ProBNP (last 3 results) No results for input(s): "PROBNP" in the last 8760 hours.  CBG: Recent Labs  Lab 11/24/21 2248  11/25/21 0814 11/25/21 1231 11/25/21 1707  GLUCAP 254* 245* 259* 255*    Lipase     Component Value Date/Time   LIPASE 41 11/24/2021 2133     Urinalysis    Component Value Date/Time   COLORURINE YELLOW 11/24/2021 2024   APPEARANCEUR CLEAR 11/24/2021 2024   LABSPEC 1.032 (H) 11/24/2021 2024   PHURINE 5.0 11/24/2021 2024   GLUCOSEU >=500 (A) 11/24/2021 2024   HGBUR NEGATIVE 11/24/2021 2024   BILIRUBINUR NEGATIVE 11/24/2021 2024   KETONESUR 20 (A)  11/24/2021 2024   PROTEINUR NEGATIVE 11/24/2021 2024   NITRITE NEGATIVE 11/24/2021 2024   LEUKOCYTESUR NEGATIVE 11/24/2021 2024     Drugs of Abuse  No results found for: "LABOPIA", "COCAINSCRNUR", "LABBENZ", "AMPHETMU", "THCU", "LABBARB"    Radiological Exams on Admission: CT THORACIC SPINE W CONTRAST  Result Date: 11/25/2021 CLINICAL DATA:  Ataxia, nontraumatic, thoracic pathology suspected. EXAM: CT THORACIC SPINE WITH CONTRAST TECHNIQUE: Multidetector CT images of thoracic was performed according to the standard protocol following intravenous contrast administration. RADIATION DOSE REDUCTION: This exam was performed according to the departmental dose-optimization program which includes automated exposure control, adjustment of the mA and/or kV according to patient size and/or use of iterative reconstruction technique. CONTRAST:  128m OMNIPAQUE IOHEXOL 300 MG/ML  SOLN COMPARISON:  CT of the thoracic spine 05/14/2021. Thoracic spine radiographs 06/18/2021. FINDINGS: Alignment: No significant listhesis is present. Thoracic kyphosis is stable. Vertebrae: Spinal augmentation noted at T5. Anterior superior endplate fracture with slight loss of height at T11 is new since the prior exams. For posterior 2/3 of the vertebral body are maintained. No other fractures are present. Paraspinal and other soft tissues: Paraspinous soft tissues are within normal limits. Mild dependent atelectasis is present the lungs bilaterally. Atherosclerotic calcifications present in the aorta without aneurysm. Visualized upper abdomen is within normal limits. Patient is status post cholecystectomy. Disc levels: No significant thoracic disc disease is present. Foramina are patent. IMPRESSION: 1. Anterior superior endplate fracture with slight loss of height at T11 is new since the prior exams. Acuity is indeterminate. 2. Spinal augmentation at T5. 3. Stable thoracic kyphosis. 4. No significant disc disease or stenosis. 5. Aortic  Atherosclerosis (ICD10-I70.0). Electronically Signed   By: CSan MorelleM.D.   On: 11/25/2021 12:49   CT LUMBAR SPINE W CONTRAST  Result Date: 11/25/2021 CLINICAL DATA:  Ataxia, nontraumatic, lumbar pathology suspected. EXAM: CT LUMBAR SPINE WITH CONTRAST TECHNIQUE: Multidetector CT imaging of the lumbar spine was performed with intravenous contrast administration. RADIATION DOSE REDUCTION: This exam was performed according to the departmental dose-optimization program which includes automated exposure control, adjustment of the mA and/or kV according to patient size and/or use of iterative reconstruction technique. CONTRAST:  1034mOMNIPAQUE IOHEXOL 300 MG/ML  SOLN COMPARISON:  CT of the lumbar spine without contrast 05/14/2021. FINDINGS: Segmentation: 5 non rib-bearing lumbar type vertebral bodies are present. The lowest fully formed vertebral body is L5. Alignment: No significant listhesis is present. Mild straightening of the scratched at slight leftward curvature is noted. Vertebrae: Vertebral body heights are maintained. No focal osseous lesions are present. Paraspinal and other soft tissues: Atherosclerotic calcifications are present the aorta and branch vessels, particularly the splenic artery. No solid organ lesions are present. No significant adenopathy is present. Disc levels: L1-2: Mild facet hypertrophy is present. No significant disc protrusion or stenosis is present. L2-3: Moderate facet hypertrophy is present. No significant disc protrusion or stenosis is present. L3-4: Mild disc bulging is present. Moderate facet hypertrophy is noted. No significant focal  stenosis is present. L4-5: A broad-based disc protrusion present. Moderate facet hypertrophy and ligamentum flavum thickening is noted. No significant stenosis or change is present. L5-S1: A broad-based disc protrusion is present. Asymmetric left-sided facet hypertrophy and ligamentum flavum thickening contributes to mild left  subarticular narrowing. Moderate foraminal stenosis is similar the prior study, left greater than right. IMPRESSION: 1. Mild left subarticular and moderate foraminal stenosis at L5-S1 secondary to a broad-based disc protrusion and asymmetric left-sided facet hypertrophy and ligamentum flavum thickening. This is similar to the prior study, left greater than right. 2. Mild disc bulging and moderate facet hypertrophy at L3-4 and L4-5 without significant stenosis or change. 3. Aortic Atherosclerosis (ICD10-I70.0). Electronically Signed   By: San Morelle M.D.   On: 11/25/2021 12:42   DG Pelvis 1-2 Views  Result Date: 11/24/2021 CLINICAL DATA:  Weakness, fall EXAM: PELVIS - 1-2 VIEW COMPARISON:  05/14/2021 FINDINGS: There is no evidence of pelvic fracture or diastasis. 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 Bones are demineralized. IMPRESSION: Negative. Electronically Signed   By: Davina Poke D.O.   On: 11/24/2021 21:52   DG Chest 2 View  Result Date: 11/24/2021 CLINICAL DATA:  Weakness, fall EXAM: CHEST - 2 VIEW COMPARISON:  07/02/2019 FINDINGS: The heart size and mediastinal contours are within normal limits. Low lung volumes. Streaky left basilar opacity. Right lung is clear. No pleural effusion or pneumothorax. The visualized skeletal structures are unremarkable. IMPRESSION: Low lung volumes with streaky left basilar opacity, atelectasis versus infiltrate. Electronically Signed   By: Davina Poke D.O.   On: 11/24/2021 21:51   CT HEAD WO CONTRAST (5MM)  Result Date: 11/24/2021 CLINICAL DATA:  Transient ischemic attack EXAM: CT HEAD WITHOUT CONTRAST TECHNIQUE: Contiguous axial images were obtained from the base of the skull through the vertex without intravenous contrast. RADIATION DOSE REDUCTION: This exam was performed according to the departmental dose-optimization program which includes automated exposure control, adjustment of the mA and/or kV  according to patient size and/or use of iterative reconstruction technique. COMPARISON:  05/14/2021 FINDINGS: Brain: Normal anatomic configuration. Parenchymal volume loss is commensurate with the patient's age. Moderate periventricular white matter changes are present likely reflecting the sequela of small vessel ischemia. No abnormal intra or extra-axial mass lesion or fluid collection. No abnormal mass effect or midline shift. No evidence of acute intracranial hemorrhage or infarct. Mild ventriculomegaly appears stable since prior examination and is slightly disproportionate to the degree of parenchymal volume loss suggesting either asymmetric central atrophy or communicating hydrocephalus. Cerebellum unremarkable. Vascular: No asymmetric hyperdense vasculature at the skull base. Skull: Intact Sinuses/Orbits: Paranasal sinuses are clear. Ocular lenses have been removed. Orbits are otherwise unremarkable. Other: Mastoid air cells and middle ear cavities are clear. IMPRESSION: 1. No acute intracranial hemorrhage or infarct. 2. Stable ventriculomegaly, slightly disproportionate to the degree of parenchymal volume loss suggesting either asymmetric central atrophy or communicating hydrocephalus. 3. Stable periventricular white matter changes, likely reflecting the sequela of small vessel ischemia. Electronically Signed   By: Fidela Salisbury M.D.   On: 11/24/2021 21:14     ------------------------------------------------------------------------------------------------------ Assessment/Plan: Principal Problem:   Leg weakness, bilateral Active Problems:   Bilateral leg weakness  Progressive generalized bilateral lower extremity weakness  -Ongoing for last 2 years, recently accelerating rate of decline.   -In the setting of rheumatoid arthritis of multiple sites, primary osteoarthritis -Follows up with rheumatology as an outpatient -Seen by neurology.  CT head unremarkable. -Patient could get an MRI done if  urology  is able to remove the penile implant.  Per ED physician, case was discussed with urologist Dr. Gloriann Loan earlier. -Once MRI done, will get PT -Neurology also recommended blood work to include ESR/CRP, SPEP, hepatitis panel, HIV, folate, B1, B6, B12 and vitamin E level  Uncontrolled type 2 diabetes mellitus with hyperglycemia -A1c 9.7 on 6/18 -Home meds include Basaglar 30 units nightly, Humalog sliding scale 3 times daily, -Currently blood sugars running elevated over 200.  Will resume long-acting insulin with 30 units of Semglee tonight. Recent Labs  Lab 11/24/21 2248 11/25/21 0814 11/25/21 1231 11/25/21 1707  GLUCAP 254* 245* 259* 255*   Chronic diastolic CHF Essential hypertension -PTA on Cardizem 300 mg daily,  CAD/HLD -PTA on  Rheumatoid arthritis -On Humira pain every 2 weeks, methotrexate 15 mg every Wednesday.  Goals of care - -  Code Status: Prior   Diet:  Diet Order             Diet Heart Room service appropriate? Yes; Fluid consistency: Thin  Diet effective now                  DVT prophylaxis: Lovenox subcu    Antimicrobials: None Fluid: None Consultants: Neurology, urology Family Communication: None at bedside Dispo: The patient is from: Home              Anticipated d/c is to: Pending clinical course  ------------------------------------------------------------------------------------- Severity of Illness: The appropriate patient status for this patient is OBSERVATION. Observation status is judged to be reasonable and necessary in order to provide the required intensity of service to ensure the patient's safety. The patient's presenting symptoms, physical exam findings, and initial radiographic and laboratory data in the context of their medical condition is felt to place them at decreased risk for further clinical deterioration. Furthermore, it is anticipated that the patient will be medically stable for discharge from the hospital within 2  midnights of admission.   Signed, Terrilee Croak, MD [facility] 11/25/2021

## 2021-11-25 NOTE — Progress Notes (Signed)
I was called back by the EP PA; this patient was seen overnight by Dr. Margo Aye and recommended for ER discharge.  Per the PA:  Progressively worsened LE weakness.  Has B neuropathy, sees rheumatology.  Previously able to ambulate, now can't get off commode.  Neurology recommends MRI/PT/OT evaluation.  Dr. Margo Aye saw the patient overnight, did not think he needed admission.   With the current information available, the patient would have to be in Observation status and thus would not qualify for CIR admission (which is what the family voiced to Dr. Margo Aye they are hoping to accomplish).  I have requested that the EDP obtain the recommended MRI and therapy evaluations.  Based on that additional information, I will be happy to reconsider admission.      Georgana Curio, M.D.

## 2021-11-25 NOTE — ED Notes (Signed)
Pt returned from MRI °

## 2021-11-25 NOTE — Progress Notes (Signed)
Pt has a penile implant that was placed in the 1980s. Pt does not have any information about implant. Per Dr. Margo Aye, we are unable to proceed with exam.   Urology able to look back and find the operative report for William Hunter dated 07/25/1983.  There is an associated patient information form from [employer].  The patient has an AMS malleable 600.  This should be MRI safe/conditional.  Recommend against removal and instead follow proper protocol for an AMS malleable 600.

## 2021-11-25 NOTE — ED Notes (Signed)
Pt taken to MRI  

## 2021-11-25 NOTE — ED Provider Notes (Signed)
Care of patient assumed from PA Sponseller at 0630.  Agree with history, physical exam and plan.  See their note for further details. Briefly, 76 year old male with a history of RA, polyneuropathy, chronic polymyalgia who presents with a complaint of progressively worsening weakness of bilateral lower extremities.  Patient has had progressively worsening weakness over the last 3 to 4 months for which she has been followed up by his rheumatologist and has not started physical therapy.  Recently patient had progression from using a cane to ambulate to being wheelchair-bound.   Physical Exam  BP (!) 149/101   Pulse 100   Temp 98.2 F (36.8 C) (Oral)   Resp 18   SpO2 98%   Physical Exam Vitals and nursing note reviewed.  Constitutional:      General: He is not in acute distress.    Appearance: He is not ill-appearing, toxic-appearing or diaphoretic.  HENT:     Head: Normocephalic.  Eyes:     General: No scleral icterus.       Right eye: No discharge.        Left eye: No discharge.  Cardiovascular:     Rate and Rhythm: Normal rate.  Pulmonary:     Effort: Pulmonary effort is normal.  Skin:    General: Skin is warm and dry.  Neurological:     General: No focal deficit present.     Mental Status: He is alert.     GCS: GCS eye subscore is 4. GCS verbal subscore is 5. GCS motor subscore is 6.  Psychiatric:        Behavior: Behavior is cooperative.     Procedures  Procedures  ED Course / MDM   Clinical Course as of 11/25/21 1551  Sun Nov 25, 2021  0086 Repeat consult to hospitalist Dr. Margo Aye who does not feel patient will require admission but he is agreeable to consulting on the patient in the emergency department.  I appreciate her collaboration in care of this patient. [RS]  [phone] Hospitalist, Dr. Margo Aye has examined this patient and feels he does not meet admission criteria. Will discuss dispo desire with patient and family.  [RS]  0805 I spoke with hospitalist Dr. Ophelia Charter to  request that patient receive MRI imaging, PT evaluation, and OT evaluation in the emergency department before any admission for this patient. [PB]  [id number] Patient is unable to receive MRI due to his penile implant.  Attending patient Dr. Rubin Payor spoke with radiology who confirmed this.  I spoke with Dr. Durene Cal with neurology team to see if there is any other imaging that patient needed at this time.  Dr. Durene Cal reached out to Dr. Otelia Limes, and reported that Dr. Otelia Limes had a prolonged conversation with patient about the penile implant.  Patient is willing to have the penile implant removed as he no longer needs it.  I will reach out to urology for further recommendations on removal of the penile implant. [PB]  [phone] I spoke to Dr. Alvester Morin who reviewed patient's case and make recommendations on possible surgery to remove penile implant. [PB]  1106 Patient ate breakfast and therefore is not a candidate for urology to perform surgery today.  As patient cannot get MRIs at this time neurology recommended CT imaging.  CT imaging was placed.  We will reconsult neurology after imaging is obtained. [PB]  1440 I spoke to Dr. Thomasena Edis with neurology who will make further recommendations on the need for MRI after reviewing his CT imaging. [PB]  1512  Weakness progressive to unable to walk. Neurology recommended MRI and admission.   Bell ? May take out implant for MRI.   Collins will look at CT and call back.  [WF]    Clinical Course User Index [PB] Haskel Schroeder, PA-C [RS] Sponseller, Eugene Gavia, PA-C [WF] Gailen Shelter, [place]   Medical Decision Making Amount and/or Complexity of Data Reviewed Labs: ordered. Radiology: ordered.  Risk OTC drugs. Prescription drug management.   At time of handoff PT/OT evaluation and MRI have been recommended by neurology who will see the patient at bedside.  We will reconsult hospitalist team for admission to obtain these modalities for further work-up  I spoke with  Dr. Ophelia Charter who requested PT/OT and MRI imaging to be completed prior to admission.  We are unable to obtain due to patient's penile implant.  I spoke with Dr. Alvester Morin of urology about removing this, he is unable to remove patient's penile plaque today due to patient having a meal earlier this morning.  I spoke with neurology who recommended obtaining CT imaging at this time as I cannot be obtained.  He also placed further lab orders.  After obtaining CT imaging I spoke to Dr. Thomasena Edis who will review the imaging and make further recommendations.  Patient care was discussed with attending Dr. Rubin Payor  Patient care transferred to PA Fondaw at the end of my shift. Patient presentation, ED course, and plan of care discussed with review of all pertinent labs and imaging. Please see his/her note for further details regarding further ED course and disposition.  I spoke to [facility] about his medical evaluation and treatment plan, answering all questions that he had      Berneice Heinrich 11/25/21 1558    Benjiman Core, MD 11/26/21 [phone]

## 2021-11-26 ENCOUNTER — Inpatient Hospital Stay (HOSPITAL_COMMUNITY): Payer: Medicare HMO

## 2021-11-26 ENCOUNTER — Other Ambulatory Visit: Payer: Self-pay

## 2021-11-26 ENCOUNTER — Observation Stay (HOSPITAL_COMMUNITY): Payer: Medicare HMO

## 2021-11-26 ENCOUNTER — Encounter (HOSPITAL_COMMUNITY): Payer: Self-pay | Admitting: Internal Medicine

## 2021-11-26 DIAGNOSIS — M47814 Spondylosis without myelopathy or radiculopathy, thoracic region: Secondary | ICD-10-CM | POA: Diagnosis present

## 2021-11-26 DIAGNOSIS — M069 Rheumatoid arthritis, unspecified: Secondary | ICD-10-CM | POA: Diagnosis not present

## 2021-11-26 DIAGNOSIS — M353 Polymyalgia rheumatica: Secondary | ICD-10-CM | POA: Diagnosis present

## 2021-11-26 DIAGNOSIS — I82412 Acute embolism and thrombosis of left femoral vein: Secondary | ICD-10-CM | POA: Diagnosis present

## 2021-11-26 DIAGNOSIS — Z8616 Personal history of COVID-19: Secondary | ICD-10-CM | POA: Diagnosis not present

## 2021-11-26 DIAGNOSIS — M170 Bilateral primary osteoarthritis of knee: Secondary | ICD-10-CM | POA: Diagnosis present

## 2021-11-26 DIAGNOSIS — E1165 Type 2 diabetes mellitus with hyperglycemia: Secondary | ICD-10-CM | POA: Diagnosis present

## 2021-11-26 DIAGNOSIS — M0600 Rheumatoid arthritis without rheumatoid factor, unspecified site: Secondary | ICD-10-CM | POA: Diagnosis present

## 2021-11-26 DIAGNOSIS — I110 Hypertensive heart disease with heart failure: Secondary | ICD-10-CM | POA: Diagnosis present

## 2021-11-26 DIAGNOSIS — E785 Hyperlipidemia, unspecified: Secondary | ICD-10-CM | POA: Diagnosis present

## 2021-11-26 DIAGNOSIS — J9811 Atelectasis: Secondary | ICD-10-CM | POA: Diagnosis present

## 2021-11-26 DIAGNOSIS — R634 Abnormal weight loss: Secondary | ICD-10-CM | POA: Diagnosis not present

## 2021-11-26 DIAGNOSIS — G9389 Other specified disorders of brain: Secondary | ICD-10-CM | POA: Diagnosis present

## 2021-11-26 DIAGNOSIS — G8929 Other chronic pain: Secondary | ICD-10-CM | POA: Diagnosis present

## 2021-11-26 DIAGNOSIS — I82532 Chronic embolism and thrombosis of left popliteal vein: Secondary | ICD-10-CM | POA: Diagnosis present

## 2021-11-26 DIAGNOSIS — I5032 Chronic diastolic (congestive) heart failure: Secondary | ICD-10-CM | POA: Diagnosis present

## 2021-11-26 DIAGNOSIS — I2699 Other pulmonary embolism without acute cor pulmonale: Secondary | ICD-10-CM

## 2021-11-26 DIAGNOSIS — I82452 Acute embolism and thrombosis of left peroneal vein: Secondary | ICD-10-CM | POA: Diagnosis present

## 2021-11-26 DIAGNOSIS — D72829 Elevated white blood cell count, unspecified: Secondary | ICD-10-CM | POA: Diagnosis present

## 2021-11-26 DIAGNOSIS — R29898 Other symptoms and signs involving the musculoskeletal system: Secondary | ICD-10-CM | POA: Diagnosis not present

## 2021-11-26 DIAGNOSIS — E669 Obesity, unspecified: Secondary | ICD-10-CM | POA: Diagnosis present

## 2021-11-26 DIAGNOSIS — E1142 Type 2 diabetes mellitus with diabetic polyneuropathy: Secondary | ICD-10-CM | POA: Diagnosis present

## 2021-11-26 DIAGNOSIS — Z86718 Personal history of other venous thrombosis and embolism: Secondary | ICD-10-CM | POA: Diagnosis not present

## 2021-11-26 DIAGNOSIS — Z9689 Presence of other specified functional implants: Secondary | ICD-10-CM | POA: Diagnosis present

## 2021-11-26 DIAGNOSIS — R531 Weakness: Secondary | ICD-10-CM | POA: Diagnosis present

## 2021-11-26 DIAGNOSIS — Z86711 Personal history of pulmonary embolism: Secondary | ICD-10-CM | POA: Diagnosis not present

## 2021-11-26 DIAGNOSIS — M6259 Muscle wasting and atrophy, not elsewhere classified, multiple sites: Secondary | ICD-10-CM | POA: Diagnosis present

## 2021-11-26 DIAGNOSIS — M47812 Spondylosis without myelopathy or radiculopathy, cervical region: Secondary | ICD-10-CM | POA: Diagnosis present

## 2021-11-26 DIAGNOSIS — M6281 Muscle weakness (generalized): Secondary | ICD-10-CM | POA: Diagnosis present

## 2021-11-26 LAB — HIGH SENSITIVITY CRP: CRP, High Sensitivity: 3.33 mg/L — ABNORMAL HIGH (ref 0.00–3.00)

## 2021-11-26 LAB — BASIC METABOLIC PANEL
Anion gap: 12 (ref 5–15)
BUN: 20 mg/dL (ref 8–23)
CO2: 21 mmol/L — ABNORMAL LOW (ref 22–32)
Calcium: 8.9 mg/dL (ref 8.9–10.3)
Chloride: 104 mmol/L (ref 98–111)
Creatinine, Ser: 0.86 mg/dL (ref 0.61–1.24)
GFR, Estimated: >60 mL/min (ref >60)
Glucose, Bld: 269 mg/dL — ABNORMAL HIGH (ref 70–99)
Potassium: 3.4 mmol/L — ABNORMAL LOW (ref 3.5–5.1)
Sodium: 137 mmol/L (ref 135–145)

## 2021-11-26 LAB — CBC
HCT: 42.3 % (ref 39.0–52.0)
Hemoglobin: 14.5 g/dL (ref 13.0–17.0)
MCH: 31.7 pg (ref 26.0–34.0)
MCHC: 34.3 g/dL (ref 30.0–36.0)
MCV: 92.4 fL (ref 80.0–100.0)
Platelets: 254 10*3/uL (ref 150–400)
RBC: 4.58 MIL/uL (ref 4.22–5.81)
RDW: 14.3 % (ref 11.5–15.5)
WBC: 10 10*3/uL (ref 4.0–10.5)
nRBC: 0 % (ref 0.0–0.2)

## 2021-11-26 LAB — GLUCOSE, CAPILLARY
Glucose-Capillary: 238 mg/dL — ABNORMAL HIGH (ref 70–99)
Glucose-Capillary: 256 mg/dL — ABNORMAL HIGH (ref 70–99)
Glucose-Capillary: 311 mg/dL — ABNORMAL HIGH (ref 70–99)
Glucose-Capillary: 280 mg/dL — ABNORMAL HIGH (ref 70–99)
Glucose-Capillary: 265 mg/dL — ABNORMAL HIGH (ref 70–99)

## 2021-11-26 LAB — HEPARIN LEVEL (UNFRACTIONATED): Heparin Unfractionated: <0.1 IU/mL — ABNORMAL LOW (ref 0.30–0.70)

## 2021-11-26 LAB — PROTIME-INR
INR: 1 (ref 0.8–1.2)
Prothrombin Time: 13 seconds (ref 11.4–15.2)

## 2021-11-26 LAB — APTT: aPTT: 26 seconds (ref 24–36)

## 2021-11-26 IMAGING — CT CT CHEST-ABD-PELV W/ CM
2 of 5 series · 12 of 46 positions shown, 14 images · IV contrast (agent unspecified)
Comparison: [DATE], MRI cervicothoracic and lumbar spine
[DATE]
COMPARISON: [DATE], MRI cervicothoracic and lumbar spine
[DATE]

Addendum:
CLINICAL DATA: Unintended weight loss. Progressive lower extremity
weakness

EXAM:
CT CHEST, ABDOMEN, AND PELVIS WITH CONTRAST
TECHNIQUE: Multidetector CT imaging of the chest, abdomen and pelvis was
performed following the standard protocol during bolus
administration of intravenous contrast.

[Series 3: cap with · axial · 0.72mm/px · z∈[-471,+89]mm · 9 of 132 slices shown, 11 images]
[im 10/132  soft-tissue]
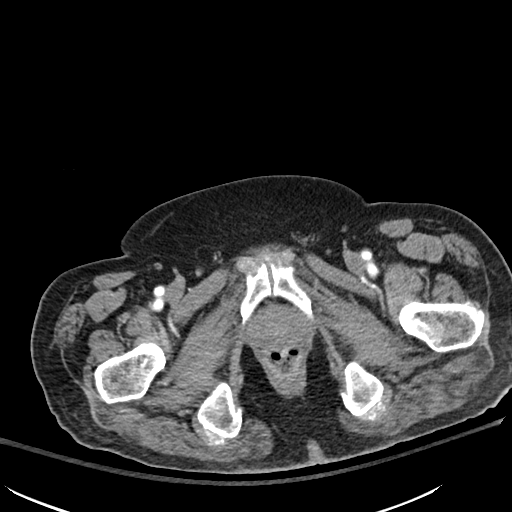
[im 10/132  bone]
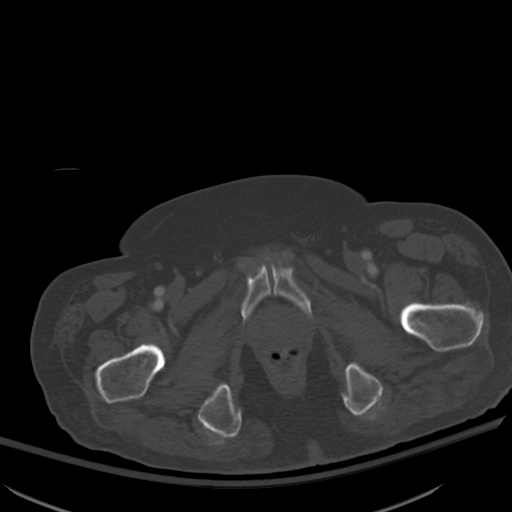
[im 29/132  soft-tissue]
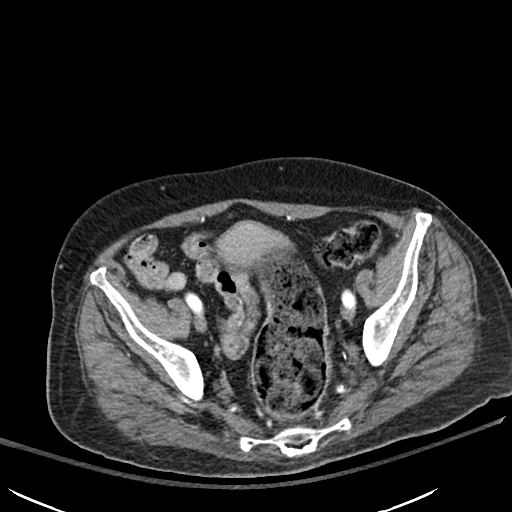
[im 38/132  soft-tissue]
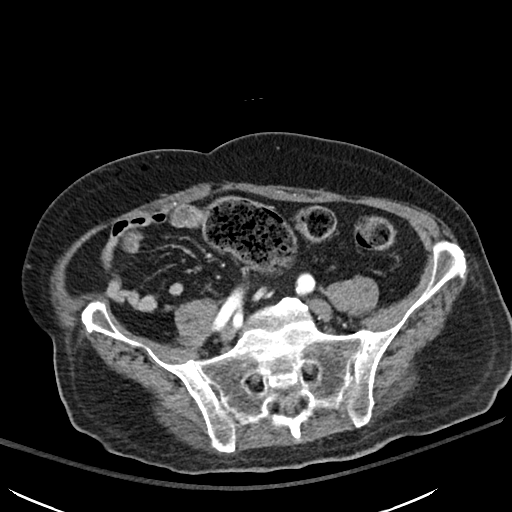
[im 57/132  soft-tissue]
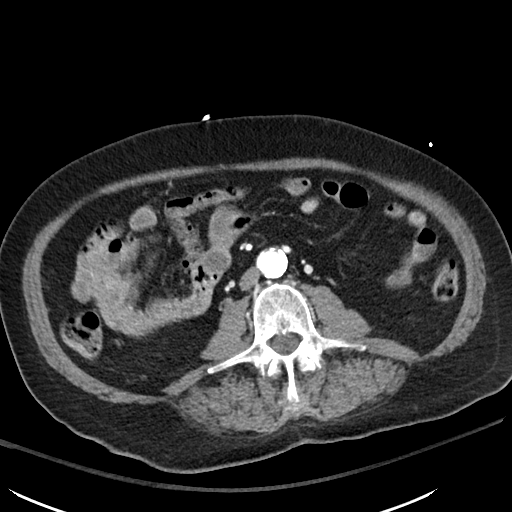
[im 66/132  soft-tissue]
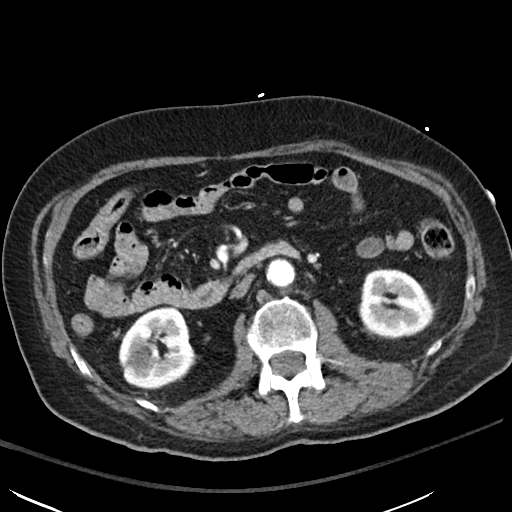
[im 75/132  soft-tissue]
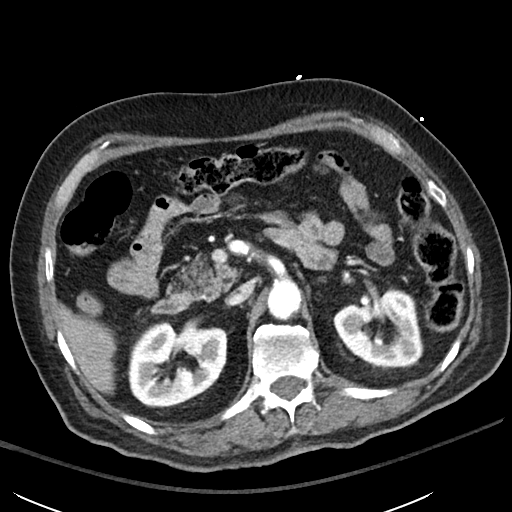
[im 94/132  soft-tissue]
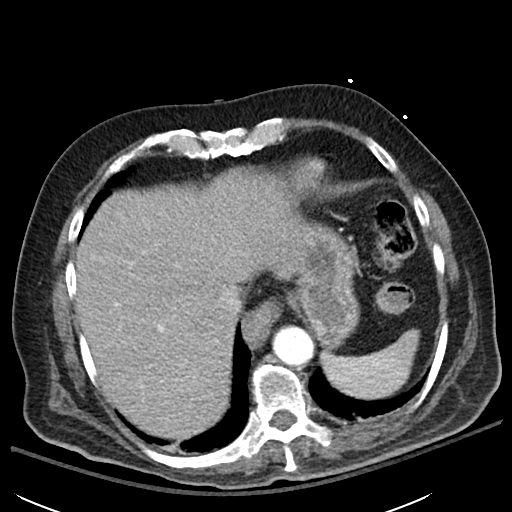
[im 103/132  soft-tissue]
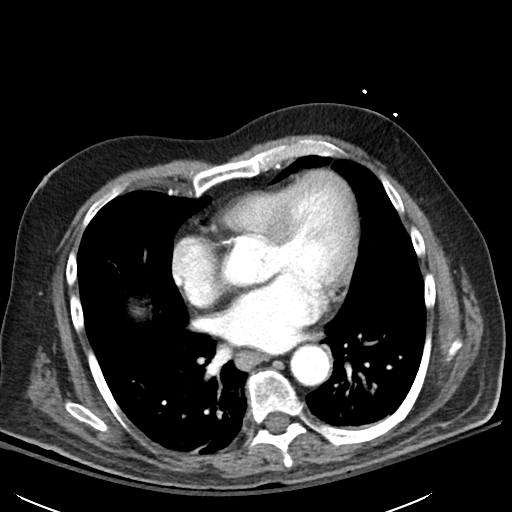
[im 122/132  soft-tissue]
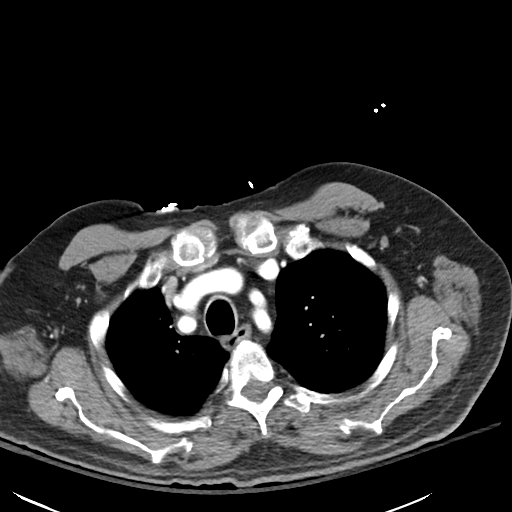
[im 122/132  bone]
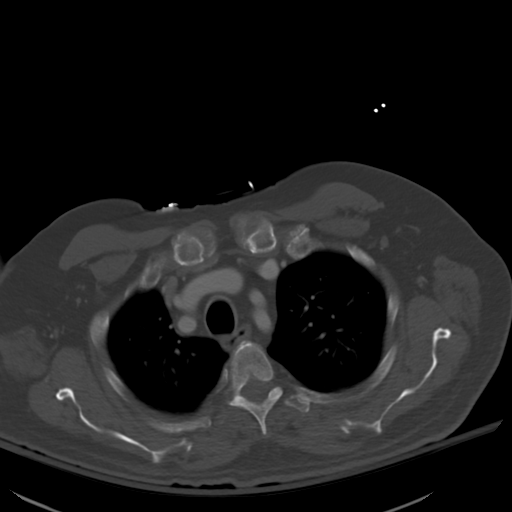

[Series 6: cor · coronal · 0.77mm/px · 3 of 106 slices shown]
[im 36/106  soft-tissue]
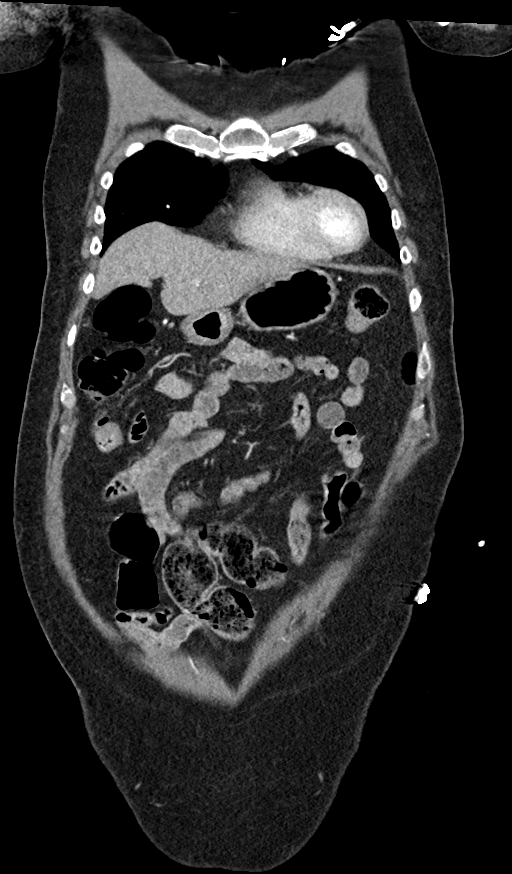
[im 47/106  soft-tissue]
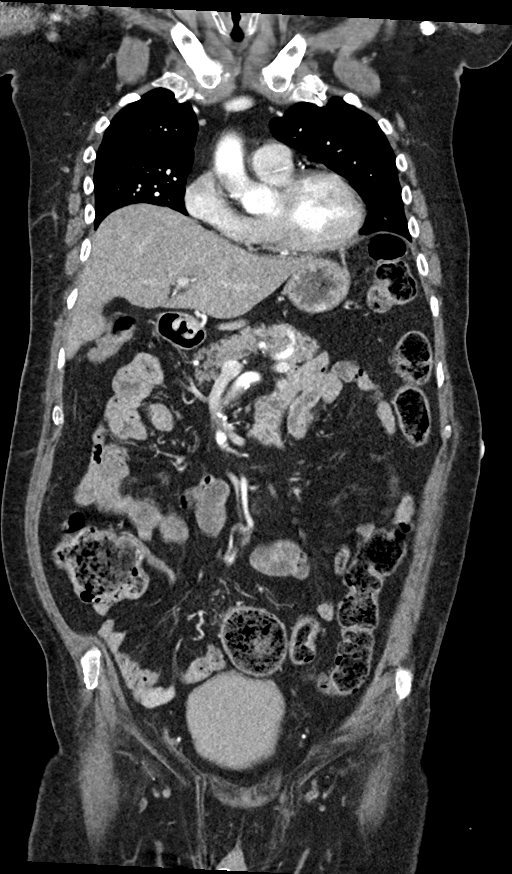
[im 59/106  soft-tissue]
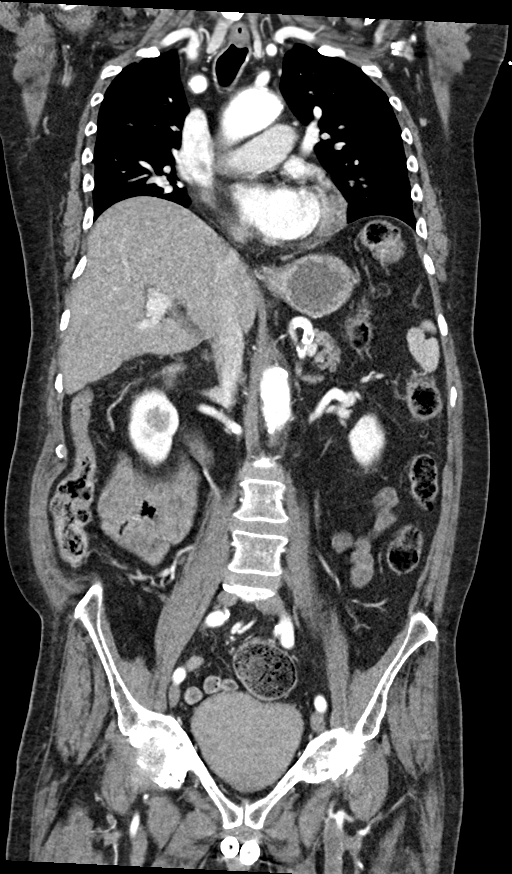

[12 of 46 positions shown; findings below may reference images not displayed]

RADIATION DOSE REDUCTION: This exam was performed according to the
departmental dose-optimization program which includes automated
exposure control, adjustment of the mA and/or kV according to
patient size and/or use of iterative reconstruction technique.

CONTRAST:  100mL OMNIPAQUE IOHEXOL 300 MG/ML  SOLN
FINDINGS: CT CHEST FINDINGS

Cardiovascular: Coronary artery calcification and aortic
atherosclerotic calcification.

Mediastinum/Nodes: No axillary or supraclavicular adenopathy. No
mediastinal or hilar adenopathy. No pericardial fluid. Esophagus
normal.

Lungs/Pleura: Filling defect the proximal RIGHT lower lobe pulmonary
artery (image [DATE]). This thrombosis is partially occlusive. No
additional evidence of pulmonary emboli. no evidence of RIGHT
ventricular strain.

Musculoskeletal: No aggressive osseous lesion.

CT ABDOMEN AND PELVIS FINDINGS

Hepatobiliary: No focal hepatic lesion. No biliary ductal
dilatation. Gallbladder is normal. Common bile duct is normal No
focal hepatic lesion. Postcholecystectomy. No biliary dilatation.

Pancreas: Pancreas is normal. No ductal dilatation. No pancreatic
inflammation.

Spleen: Normal spleen

Adrenals/urinary tract: Adrenal glands are normal. Low-density
lesion in the RIGHT kidney measuring 24 mm is intermediate density
on postcontrast exam. Ureters and bladder normal.

Stomach/Bowel: Stomach, small bowel, appendix, and cecum are normal.
Moderate volume stool in the rectum. LEFT colon normal

Vascular/Lymphatic: Abdominal aorta is normal caliber with
atherosclerotic calcification. There is no retroperitoneal or
periportal lymphadenopathy. No pelvic lymphadenopathy.

Reproductive: Prostate normal.  Penile prosthetic noted

Other: No free fluid.

Musculoskeletal: No aggressive osseous lesion.
IMPRESSION: Chest Impression:

1. Acute pulmonary embolism within the proximal RIGHT lower
pulmonary artery. Overall clot burden is moderate. No RIGHT
ventricular strain.
2. No evidence of malignancy in the thorax.
3. Coronary artery calcification and Aortic Atherosclerosis
([AA]-[AA]).

Abdomen / Pelvis Impression:

1. No evidence of malignancy in the abdomen pelvis.
2. Indeterminate lesion in the RIGHT kidney is favored a benign
cyst. This could be confirmed with renal MRI with contrast.
3. Moderate volume stool in the rectum.

ADDENDUM:
Findings conveyed toFloor nurse SONG on [DATE]  at[DATE].

*** End of Addendum ***
RADIATION DOSE REDUCTION: This exam was performed according to the
departmental dose-optimization program which includes automated
exposure control, adjustment of the mA and/or kV according to
patient size and/or use of iterative reconstruction technique.

CONTRAST:  100mL OMNIPAQUE IOHEXOL 300 MG/ML  SOLN
FINDINGS: CT CHEST FINDINGS

Cardiovascular: Coronary artery calcification and aortic
atherosclerotic calcification.

Mediastinum/Nodes: No axillary or supraclavicular adenopathy. No
mediastinal or hilar adenopathy. No pericardial fluid. Esophagus
normal.

Lungs/Pleura: Filling defect the proximal RIGHT lower lobe pulmonary
artery (image [DATE]). This thrombosis is partially occlusive. No
additional evidence of pulmonary emboli. no evidence of RIGHT
ventricular strain.

Musculoskeletal: No aggressive osseous lesion.

CT ABDOMEN AND PELVIS FINDINGS

Hepatobiliary: No focal hepatic lesion. No biliary ductal
dilatation. Gallbladder is normal. Common bile duct is normal No
focal hepatic lesion. Postcholecystectomy. No biliary dilatation.

Pancreas: Pancreas is normal. No ductal dilatation. No pancreatic
inflammation.

Spleen: Normal spleen

Adrenals/urinary tract: Adrenal glands are normal. Low-density
lesion in the RIGHT kidney measuring 24 mm is intermediate density
on postcontrast exam. Ureters and bladder normal.

Stomach/Bowel: Stomach, small bowel, appendix, and cecum are normal.
Moderate volume stool in the rectum. LEFT colon normal

Vascular/Lymphatic: Abdominal aorta is normal caliber with
atherosclerotic calcification. There is no retroperitoneal or
periportal lymphadenopathy. No pelvic lymphadenopathy.

Reproductive: Prostate normal.  Penile prosthetic noted

Other: No free fluid.

Musculoskeletal: No aggressive osseous lesion.
IMPRESSION: Chest Impression:

1. Acute pulmonary embolism within the proximal RIGHT lower
pulmonary artery. Overall clot burden is moderate. No RIGHT
ventricular strain.
2. No evidence of malignancy in the thorax.
3. Coronary artery calcification and Aortic Atherosclerosis
([AA]-[AA]).

Abdomen / Pelvis Impression:

1. No evidence of malignancy in the abdomen pelvis.
2. Indeterminate lesion in the RIGHT kidney is favored a benign
cyst. This could be confirmed with renal MRI with contrast.
3. Moderate volume stool in the rectum.

## 2021-11-26 IMAGING — MR MR HEAD WO/W CM
14 of 16 series · 40 of 48 positions shown · IV contrast (gadavist)
Comparison: Prior CT imaging

CLINICAL DATA: New bilateral lower leg weakness

EXAM:
MRI HEAD WITHOUT AND WITH CONTRAST
TECHNIQUE: Multiplanar, multiecho pulse sequences of the brain and surrounding
structures were obtained without and with intravenous contrast.
CONTRAST:  8.5mL GADAVIST GADOBUTROL 1 MMOL/ML IV SOLN

[Series 5: DWI · axial · 3.0mm · 0.88mm/px · z∈[-80,+73]mm · 6 of 104 slices shown (1 of 4)]
[im 1/104]
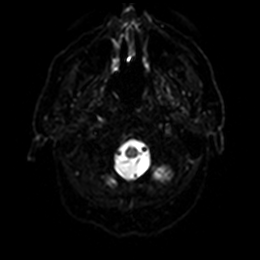
[im 21/104]
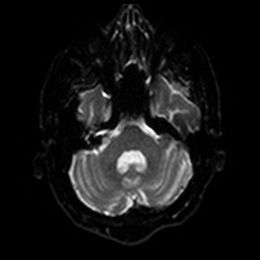
[im 42/104]
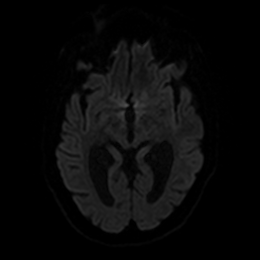
[im 62/104]
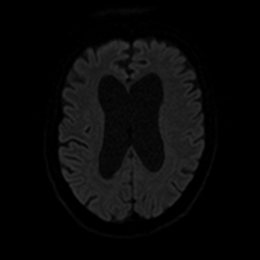
[im 83/104]
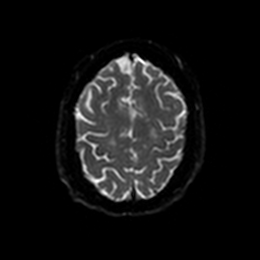
[im 104/104]
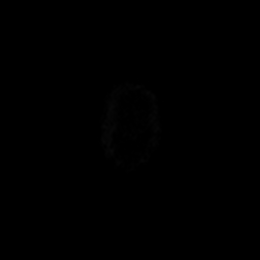

[Series 6: DWI · axial · 3.0mm · 0.88mm/px · z∈[-80,+73]mm · 3 of 52 slices shown (2 of 4)]
[im 1/52]
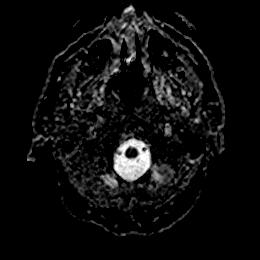
[im 26/52]
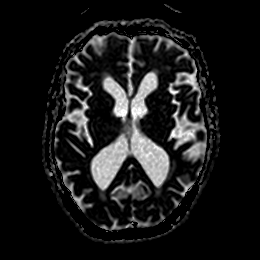
[im 52/52]
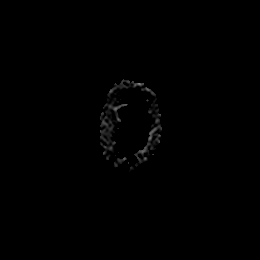

[Series 7: DWI · coronal · 4.0mm · 0.88mm/px · 4 of 64 slices shown (3 of 4)]
[im 1/64]
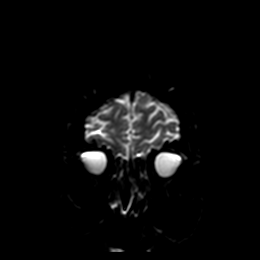
[im 22/64]
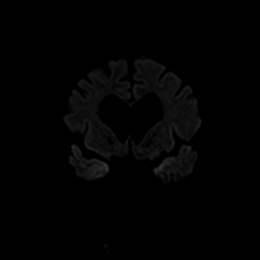
[im 43/64]
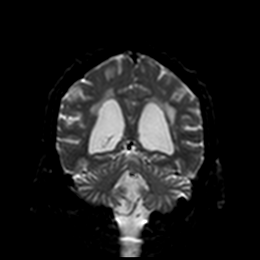
[im 64/64]
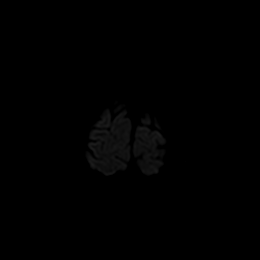

[Series 8: DWI · coronal · 4.0mm · 0.88mm/px · 2 of 32 slices shown (4 of 4)]
[im 1/32]
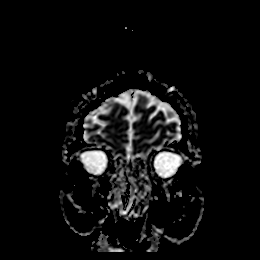
[im 32/32]
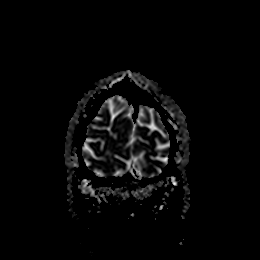

[Series 9: T1 · sagittal · 5.0mm · 0.75mm/px · 1 of 23 slices shown]
[im 1/23]
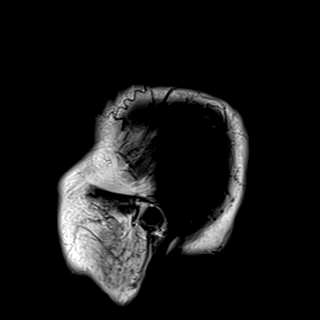

[Series 10: T2 · axial · 5.0mm · 0.72mm/px · z∈[-79,+65]mm · 2 of 25 slices shown]
[im 1/25]
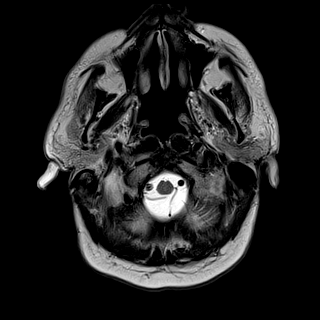
[im 25/25]
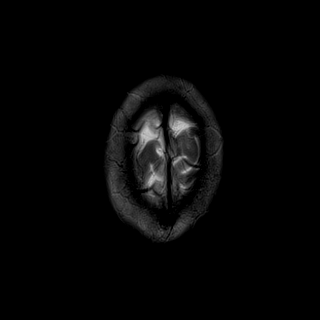

[Series 11: FLAIR · axial · 5.0mm · 0.45mm/px · z∈[-78,+66]mm · 2 of 25 slices shown]
[im 1/25]
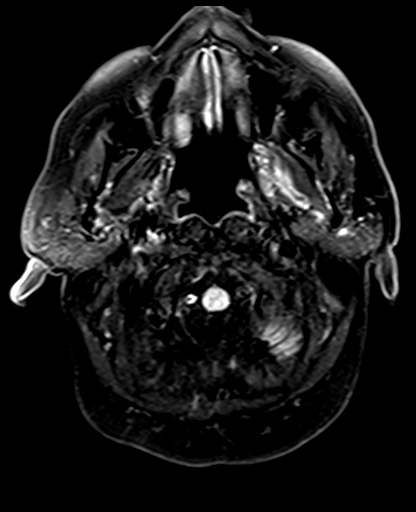
[im 25/25]
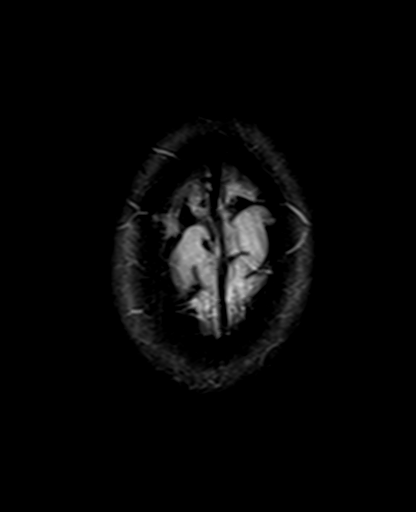

[Series 12: mag_images · axial · 3.0mm · 0.90mm/px · z∈[-91,+86]mm · 4 of 60 slices shown]
[im 1/60]
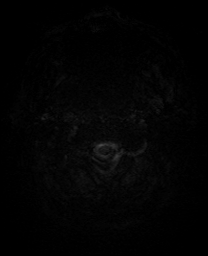
[im 20/60]
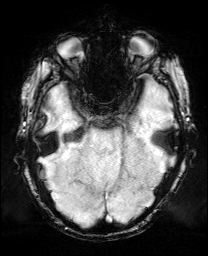
[im 40/60]
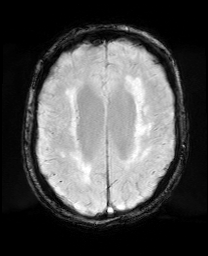
[im 60/60]
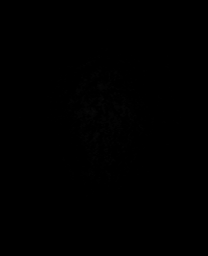

[Series 13: pha_images · axial · 3.0mm · 0.90mm/px · z∈[-91,+83]mm · 4 of 58 slices shown]
[im 1/58]
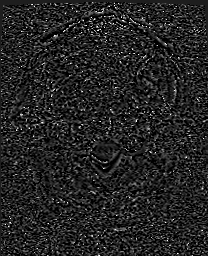
[im 20/58]
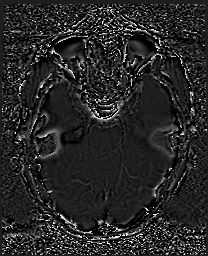
[im 39/58]
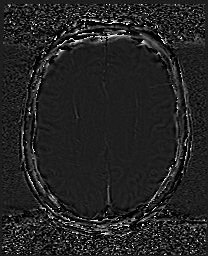
[im 58/58]
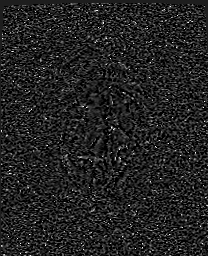

[Series 14: swi_images · axial · 3.0mm · 0.90mm/px · z∈[-91,+86]mm · 4 of 60 slices shown]
[im 1/60]
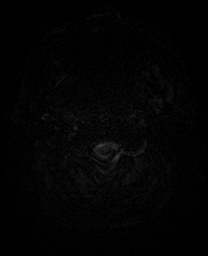
[im 20/60]
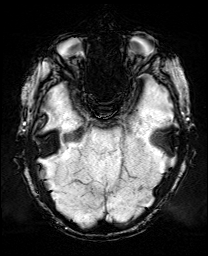
[im 40/60]
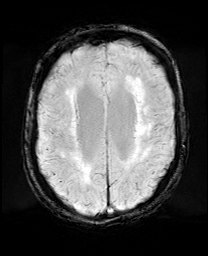
[im 60/60]
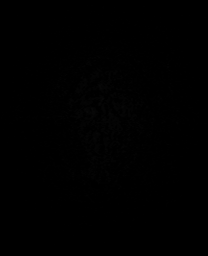

[Series 15: mip_images(sw) · axial · 24.0mm · 0.90mm/px · z∈[-81,+75]mm · 3 of 53 slices shown]
[im 1/53]
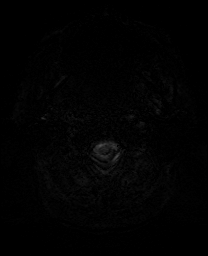
[im 27/53]
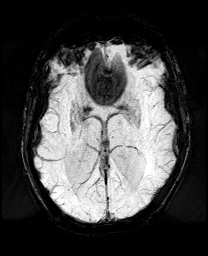
[im 53/53]
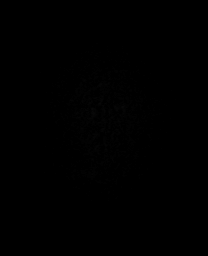

[Series 18: T2 post-contrast · coronal · 5.0mm · 0.72mm/px · 2 of 28 slices shown]
[im 1/28]
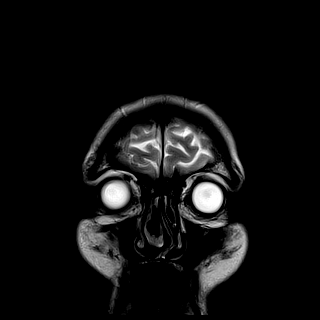
[im 28/28]
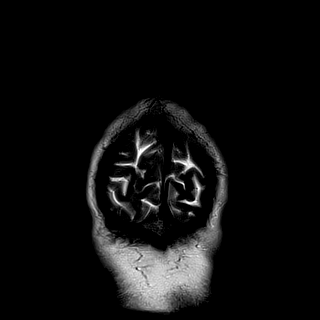

[Series 20: T1 post-contrast · coronal · 5.0mm · 0.34mm/px · 2 of 28 slices shown (1 of 2)]
[im 1/28]
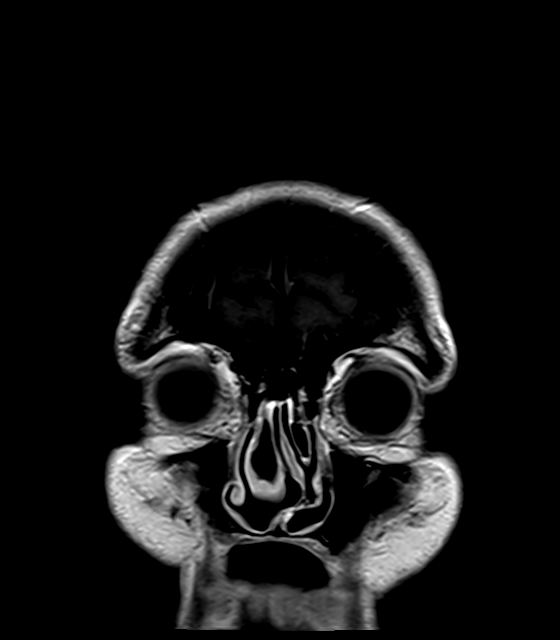
[im 28/28]
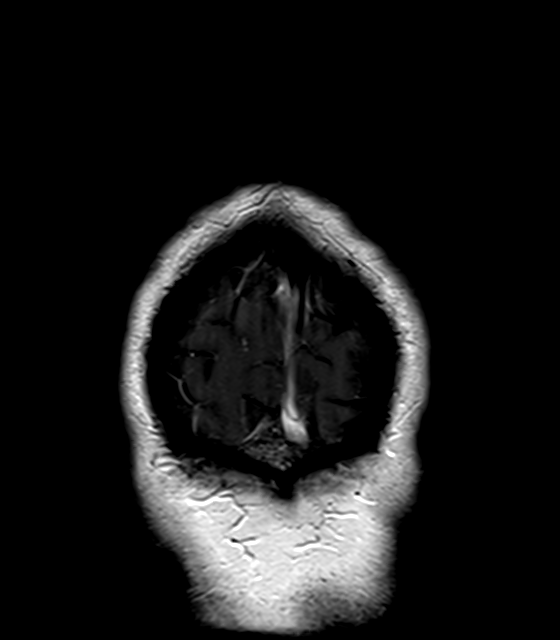

[Series 21: T1 post-contrast · sagittal · 5.0mm · 0.72mm/px · 1 of 23 slices shown (2 of 2)]
[im 1/23]
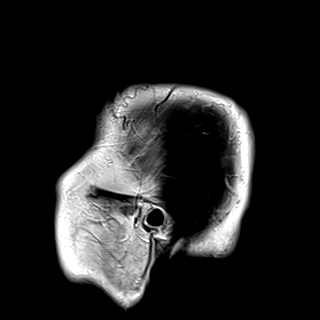

[40 of 48 positions shown; findings below may reference images not displayed]

FINDINGS: Brain: There is no acute infarction or intracranial hemorrhage.
There is no intracranial mass, mass effect, or edema. There is no
hydrocephalus or extra-axial fluid collection. Prominence of the
ventricles and sulci reflects parenchymal volume loss. There is
disproportionate ventricular prominence. Patchy and confluent areas
of T2 hyperintensity in the supratentorial white matter are
nonspecific but may reflect moderate chronic microvascular ischemic
changes. No abnormal enhancement.

Vascular: Major vessel flow voids at the skull base are preserved.

Skull and upper cervical spine: Normal marrow signal is preserved.

Sinuses/Orbits: Paranasal sinuses are aerated. Orbits are
unremarkable.

Other: Sella is unremarkable.  Mastoid air cells are clear.
IMPRESSION: No evidence of recent infarction, hemorrhage, or mass. No abnormal
enhancement.

Moderate chronic microvascular ischemic changes.

Chronic disproportionate ventricular prominence likely reflecting
central volume loss. Normal pressure hydrocephalus is possible in
the appropriate setting.

## 2021-11-26 MED ORDER — LORAZEPAM 2 MG/ML IJ SOLN: 1.0000 mg | Freq: Once | INTRAMUSCULAR | Status: DC | PRN

## 2021-11-26 MED ORDER — NYSTATIN 100000 UNIT/GM EX POWD
Freq: Two times a day (BID) | CUTANEOUS | Status: DC
  Filled 2021-11-26: qty 15

## 2021-11-26 MED ORDER — INSULIN ASPART 100 UNIT/ML IJ SOLN
0.0000 [IU] | Freq: Every day | INTRAMUSCULAR | Status: DC
  Administered 2021-11-26: 3 [IU] via SUBCUTANEOUS
  Administered 2021-11-27: 2 [IU] via SUBCUTANEOUS
  Administered 2021-11-28: 4 [IU] via SUBCUTANEOUS

## 2021-11-26 MED ORDER — INSULIN ASPART 100 UNIT/ML IJ SOLN
0.0000 [IU] | Freq: Three times a day (TID) | INTRAMUSCULAR | Status: DC
  Administered 2021-11-26: 8 [IU] via SUBCUTANEOUS
  Administered 2021-11-26: 11 [IU] via SUBCUTANEOUS
  Administered 2021-11-27: 8 [IU] via SUBCUTANEOUS
  Administered 2021-11-27 (×2): 5 [IU] via SUBCUTANEOUS
  Administered 2021-11-28: 3 [IU] via SUBCUTANEOUS
  Administered 2021-11-28 (×2): 5 [IU] via SUBCUTANEOUS
  Administered 2021-11-29 (×2): 8 [IU] via SUBCUTANEOUS

## 2021-11-26 MED ORDER — INSULIN GLARGINE-YFGN 100 UNIT/ML ~~LOC~~ SOLN
35.0000 [IU] | Freq: Every day | SUBCUTANEOUS | Status: DC
Start: 2021-11-26 — End: 2021-11-28
  Administered 2021-11-26 – 2021-11-27 (×2): 35 [IU] via SUBCUTANEOUS
  Filled 2021-11-26 (×3): qty 0.35

## 2021-11-26 MED ORDER — HEPARIN BOLUS VIA INFUSION
5000.0000 [IU] | Freq: Once | INTRAVENOUS | Status: AC
  Administered 2021-11-26: 5000 [IU] via INTRAVENOUS
  Filled 2021-11-26: qty 5000

## 2021-11-26 MED ORDER — IOHEXOL 9 MG/ML PO SOLN
ORAL | Status: AC
  Filled 2021-11-26: qty 1000

## 2021-11-26 MED ORDER — HEPARIN (PORCINE) 25000 UT/250ML-% IV SOLN
1300.0000 [IU]/h | INTRAVENOUS | Status: AC
  Administered 2021-11-26: 1400 [IU]/h via INTRAVENOUS
  Administered 2021-11-27 – 2021-11-28 (×2): 1300 [IU]/h via INTRAVENOUS
  Filled 2021-11-26 (×3): qty 250

## 2021-11-26 MED ORDER — POTASSIUM CHLORIDE CRYS ER 20 MEQ PO TBCR
40.0000 meq | EXTENDED_RELEASE_TABLET | Freq: Once | ORAL | Status: AC
  Administered 2021-11-26: 40 meq via ORAL
  Filled 2021-11-26: qty 2

## 2021-11-26 MED ORDER — GADOBUTROL 1 MMOL/ML IV SOLN
8.5000 mL | Freq: Once | INTRAVENOUS | Status: AC | PRN
  Administered 2021-11-26: 8.5 mL via INTRAVENOUS

## 2021-11-26 MED ORDER — IOHEXOL 300 MG/ML  SOLN
100.0000 mL | Freq: Once | INTRAMUSCULAR | Status: AC | PRN
  Administered 2021-11-26: 100 mL via INTRAVENOUS

## 2021-11-26 NOTE — Evaluation (Signed)
Occupational Therapy Evaluation Patient Details Name: William Hunter MRN: [id number] DOB: 1945-11-24 Today's Date: 11/26/2021   History of Present Illness 76 y.o. male presents to [facility] 11/24/2021 with progressive weakness over the last 3-4 months, unable to ambulate. PMH includes OA, CAD, HTN, RA, DMII.   Clinical Impression   Pt reports primarily staying his his recliner x 2-3 months with increasing weakness. His wife was assisting with all ADLs and IADLs. Pt with flat affect, but following commands with increased time and oriented. When asked why pt did not come to the hospital or alert his MD of his weakness, pt reported, "I'm stubborn, I guess." Pt presents with flat affect, generalized weakness and poor sitting and standing balance. Pt requires up to max assist for bed mobility. He was able to stand from heightened bed with heavy mod assist and take shuffling steps along EOB with moderate assistance. Pt is agreeable to post acute rehab. Recommending AIR.     Recommendations for follow up therapy are one component of a multi-disciplinary discharge planning process, led by the attending physician.  Recommendations may be updated based on patient status, additional functional criteria and insurance authorization.   Follow Up Recommendations  Acute inpatient rehab (3hours/day)    Assistance Recommended at Discharge Frequent or constant Supervision/Assistance  Patient can return home with the following Two people to help with walking and/or transfers;A lot of help with bathing/dressing/bathroom;Assistance with cooking/housework;Assist for transportation;Help with stairs or ramp for entrance    Functional Status Assessment  Patient has had a recent decline in their functional status and demonstrates the ability to make significant improvements in function in a reasonable and predictable amount of time.  Equipment Recommendations  BSC/3in1;Wheelchair (measurements OT);Wheelchair cushion  (measurements OT)    Recommendations for Other Services       Precautions / Restrictions Precautions Precautions: Fall Restrictions Weight Bearing Restrictions: No      Mobility Bed Mobility Overal bed mobility: Needs Assistance Bed Mobility: Supine to Sit, Sit to Supine     Supine to sit: Max assist Sit to supine: Min assist   General bed mobility comments: pt able to advance LEs over EOB, assist to raise trunk and position hips at EOB,  min assist for LEs back into bed, max assist to pull up in bed with pt using head board to pull    Transfers Overall transfer level: Needs assistance Equipment used: Rolling walker (2 wheels) Transfers: Sit to/from Stand Sit to Stand: Mod assist, From elevated surface           General transfer comment: heavy mod to stand from elevated bed, posterior lean with pt stabilizing back of legs on bed, able to take several shuffling steps toward [facility] with moderate assistance, fatigues easily      Balance Overall balance assessment: Needs assistance Sitting-balance support: Single extremity supported, Bilateral upper extremity supported, Feet unsupported Sitting balance-Leahy Scale: Poor Sitting balance - Comments: progressed from mod to min guard assist, posterio lean     Standing balance-Leahy Scale: Poor                             ADL either performed or assessed with clinical judgement   ADL Overall ADL's : Needs assistance/impaired Eating/Feeding: Set up;Bed level   Grooming: Wash/dry hands;Wash/dry face;Sitting;Min guard   Upper Body Bathing: Moderate assistance;Sitting   Lower Body Bathing: Total assistance;Sit to/from stand   Upper Body Dressing : Moderate assistance;Sitting  Lower Body Dressing: Total assistance;Sit to/from stand       Toileting- Architect and Hygiene: Total assistance;Sit to/from stand               Vision Ability to See in Adequate Light: 0 Adequate Patient Visual  Report: No change from baseline       Perception     Praxis      Pertinent Vitals/Pain Pain Assessment Pain Assessment: No/denies pain     Hand Dominance Left   Extremity/Trunk Assessment Upper Extremity Assessment Upper Extremity Assessment: Generalized weakness   Lower Extremity Assessment Lower Extremity Assessment: Defer to PT evaluation   Cervical / Trunk Assessment Cervical / Trunk Assessment: Other exceptions (weakness)   Communication Communication Communication: No difficulties   Cognition Arousal/Alertness: Awake/alert Behavior During Therapy: Flat affect Overall Cognitive Status: No family/caregiver present to determine baseline cognitive functioning                                 General Comments: pt states he did not come to the hospital because he was stubborn, oriented, following commands with increased time.     General Comments       Exercises     Shoulder Instructions      Home Living Family/patient expects to be discharged to:: Private residence Living Arrangements: Spouse/significant other Available Help at Discharge: Family;Available 24 hours/day Type of Home: House Home Access: Stairs to enter [employer] of Steps: 1 Entrance Stairs-Rails: None Home Layout: One level     Bathroom Shower/Tub: Producer, television/film/video: Standard     Home Equipment: Agricultural consultant (2 wheels);Cane - single point;Wheelchair - manual;Toilet riser          Prior Functioning/Environment Prior Level of Function : Needs assist             Mobility Comments: pt reports he was able to ambulate with a cane 3 months ago, has declined gradually and is now unable to stand ADLs Comments: reports he must mostly sitting in his recliner with his wife assisting him with ADLs and IADLs x 2-3 months        OT Problem List: Impaired balance (sitting and/or standing);Decreased knowledge of use of DME or AE;Decreased  strength;Decreased activity tolerance      OT Treatment/Interventions: Self-care/ADL training;DME and/or AE instruction;Therapeutic activities;Patient/family education;Balance training    OT Goals(Current goals can be found in the care plan section) Acute Rehab OT Goals OT Goal Formulation: With patient Time For Goal Achievement: 12/10/21 Potential to Achieve Goals: Good ADL Goals Pt Will Perform Grooming: with set-up;sitting Pt Will Perform Upper Body Bathing: with min assist;sitting Pt Will Perform Upper Body Dressing: with set-up;sitting Pt Will Transfer to Toilet: with min assist;ambulating;bedside commode Pt/caregiver will Perform Home Exercise Program: Increased strength;Both right and left upper extremity;With minimal assist Additional ADL Goal #1: Pt will perform bed mobility with min assist in preparation for ADLs. Additional ADL Goal #2: Pt will reach outside is BoS in sitting and return to midline without LOB.  OT Frequency: Min 2X/week    Co-evaluation              AM-PAC OT "6 Clicks" Daily Activity     Outcome Measure Help from another person eating meals?: None Help from another person taking care of personal grooming?: A Little Help from another person toileting, which includes using toliet, bedpan, or urinal?: A Lot Help from another  person bathing (including washing, rinsing, drying)?: A Lot Help from another person to put on and taking off regular upper body clothing?: A Lot Help from another person to put on and taking off regular lower body clothing?: Total 6 Click Score: 14   End of Session Equipment Utilized During Treatment: Gait belt;Rolling walker (2 wheels) Nurse Communication: Other (comment) (ok to order breakfast tray)  Activity Tolerance: Patient tolerated treatment well Patient left: in bed;with call bell/phone within reach;with bed alarm set  OT Visit Diagnosis: Unsteadiness on feet (R26.81);Other abnormalities of gait and mobility  (R26.89);Muscle weakness (generalized) (M62.81)                Time: 0912-0929 OT Time Calculation (min): 17 min Charges:  OT General Charges $OT Visit: 1 Visit OT Evaluation $OT Eval Moderate Complexity: 1 Mod  Berna Spare, OTR/L Acute Rehabilitation Services Office: [phone]  Evern Bio 11/26/2021, 9:43 AM

## 2021-11-26 NOTE — Progress Notes (Signed)
PROGRESS NOTE  William Hunter  DOB: 1946-04-23  PCP: [name], DO ZOX:[id number]  DOA: 11/24/2021  LOS: 0 days  Hospital Day: 3  Brief narrative: William Hunter is a 76 y.o. male with PMH significant for rheumatoid arthritis of multiple sites on Humira followed by rheumatology at [facility], primary osteoarthritis of both knees followed by orthopedic surgery, type 2 diabetes, hypertension, hyperlipidemia, coronary artery disease, chronic diastolic CHF. Patient was brought to the ED by EMS on 6/17 with complaint of progressive chronic right-sided weakness.  He has progressive weakness over 2-year period with recently accelerating rate of decline.  His weakness has become so severe that he has recently begun to follow with knees buckling when trying to get out of bed to recliner.  He has been confined to his recliner due to this for past 2 weeks.  No bowel/bladder incontinence.  He receiving PT OT at home and was in process of getting more hours of physical therapy at home.  Because of accelerating decline, his family brought him to the ED with a hope to get him to CIR.   In the ED, he was hemodynamically stable Initial labs with unremarkable CBC and BMP except for elevated blood glucose to 300s. CT head did not show any acute intracranial abnormality.   Patient was seen by neurology Dr. Cheral Marker.  On his exam, patient noted to have diffuse limb weakness worse in the lower extremities, diffuse muscle atrophy. Admitted to hospital service MRI brain, cervical spine, thoracic and lumbar spine were obtained MRI brain:  No evidence of infarction, hemorrhage, mass.  Moderate chronic microvascular ischemic changes.  Chronic disproportionate ventricular prominence likely reflecting central volume loss with possible normal pressure hydrocephalus. MRI cervical/thoracic/lumbar spine: -No evidence of spinal canal stenosis or cord abnormality -Multilevel uncovertebral and facet arthropathy  causing multilevel neuroforaminal narrowing at this cervical, thoracic as well as lumbar level.  See below for details.  Subjective: Patient was seen and examined this morning.   Lying down in bed.  Not in distress.  No new symptoms.   No family at bedside.  Continues to have bilateral lower extremity weakness  Principal Problem:   Leg weakness, bilateral Active Problems:   Bilateral leg weakness   Lower extremity weakness    Assessment and plan: Progressive generalized bilateral lower extremity weakness  -Ongoing for last 2 years, recently accelerating rate of decline.   -In the setting of primary osteoarthritis and rheumatoid arthritis of multiple sites -MRI brain with possibility of normal pressure hydrocephalus.  Neurology following.   -Because of the dramatic nature of his weakness, neurology wants to rule out a malignancy.  CT chest, abdomen pelvis ordered -If CT scan normal, noted a plan for lumbar puncture tomorrow -Neurology also sent another blood work including ESR/CRP, SPEP, hepatitis panel, HIV, folate, B1, B6, B12 and vitamin E level.   Uncontrolled type 2 diabetes mellitus with hyperglycemia -A1c 9.7 on 6/18 -Home meds include Basaglar 30 units nightly, Humalog sliding scale 3 times daily. -Patient is currently on Semglee 30 units nightly along with sliding scale insulin.  Blood sugar level remains elevated over 200.  I will increase Semglee to 35 units for tonight. Recent Labs  Lab 11/25/21 1231 11/25/21 1707 11/25/21 2340 11/26/21 0818 11/26/21 1236  GLUCAP 259* 255* 325* 238* 256*   Sinus tachycardia -Events of last night noted, patient was tachycardic up to 120s.  It was noted that patient had not received his home dose of Cardizem that day.  Once Cardizem was resumed, heart rate improved to normal.  Chronic diastolic CHF Essential hypertension -PTA on Cardizem 300 mg daily,   CAD/HLD -I do not see statin or any antiplatelet/anticoagulant in his  list   Rheumatoid arthritis -On Humira pain every 2 weeks, methotrexate 15 mg every Wednesday.  Penile implant -Patient had a penile implant placed in 1980s.  Urologist after male with reached out on admission to see MRI compatibility of the implant.  MRI was approved.   Goals of care   Code Status: Full Code    Mobility: PT eval ordered  Skin assessment:     Nutritional status:  There is no height or weight on file to calculate BMI.          Diet:  Diet Order             Diet Carb Modified Fluid consistency: Thin; Room service appropriate? No  Diet effective now                   DVT prophylaxis:  enoxaparin (LOVENOX) injection 40 mg Start: 11/25/21 2000   Antimicrobials: None Fluid: None Consultants: Neurology Family Communication: None at bedside  Status is: Observation  Continue in-hospital care because: Further work-up, PT eval.  May need lumbar puncture Level of care: Telemetry Cardiac   Dispo: The patient is from: Home              Anticipated d/c is to: Pending clinical course              Patient currently is not medically stable to d/c.   Difficult to place patient No     Infusions:    Scheduled Meds:  diltiazem  300 mg Oral Daily   enoxaparin (LOVENOX) injection  40 mg Subcutaneous Q24H   insulin aspart  0-15 Units Subcutaneous TID WC   insulin aspart  0-5 Units Subcutaneous QHS   insulin glargine-yfgn  30 Units Subcutaneous QHS   iohexol       [START ON 11/28/2021] methotrexate  15 mg Oral Q Wed   nystatin   Topical BID    PRN meds: acetaminophen **OR** acetaminophen, acetaminophen, albuterol, hydrALAZINE, iohexol, loratadine, LORazepam   Antimicrobials: Anti-infectives (From admission, onward)    None       Objective: Vitals:   11/26/21 0400 11/26/21 0816  BP: 122/85 104/60  Pulse: 77 80  Resp: 18 20  Temp: 98.4 F (36.9 C) 98 F (36.7 C)  SpO2: 95% 94%    Intake/Output Summary (Last 24 hours) at 11/26/2021  1338 Last data filed at 11/26/2021 0955 Gross per 24 hour  Intake --  Output 600 ml  Net -600 ml   There were no vitals filed for this visit. Weight change:  There is no height or weight on file to calculate BMI.   Physical Exam: General exam: Pleasant, elderly Caucasian male.  Not in physical distress Skin: No rashes, lesions or ulcers. HEENT: Atraumatic, normocephalic, no obvious bleeding Lungs: Clear to auscultation bilaterally CVS: Regular rate and rhythm, no murmur GI/Abd soft, nontender, nondistended, bowel sound present CNS: Alert, awake, oriented x3.  Lower extremity weaknesses. Psychiatry: Mood appropriate Extremities: No pedal edema no calf tenderness  Data Review: I have personally reviewed the laboratory data and studies available.  F/u labs ordered Unresulted Labs (From admission, onward)     Start     Ordered   11/25/21 1101  Vitamin B6  Once,   URGENT  11/25/21 1106   11/25/21 1100  Vitamin B1  Once,   URGENT        11/25/21 1106   11/25/21 1100  Multiple Myeloma Panel (SPEP&IFE w/QIG)  Once,   URGENT        11/25/21 1106   11/25/21 1100  Vitamin E  Once,   URGENT        11/25/21 1106   11/25/21 1100  HIV-1/2 AB - differentiation  Once,   URGENT        11/25/21 1106   11/25/21 0757  Miscellaneous [facility] test (send-out)  Once,   URGENT       Question:  Test name / description:  Answer:  [facility] Myositis Panel   11/25/21 0757            Signed, Terrilee Croak, MD [facility] 11/26/2021

## 2021-11-26 NOTE — Progress Notes (Addendum)
ANTICOAGULATION CONSULT NOTE - Initial Consult  Pharmacy Consult for heparin Indication: pulmonary embolus  Allergies  Allergen Reactions   Codeine Swelling   Penicillins Hives and Swelling    Has patient had a PCN reaction causing immediate rash, facial/tongue/throat swelling, SOB or lightheadedness with hypotension: Yes Has patient had a PCN reaction causing severe rash involving mucus membranes or skin necrosis: No Has patient had a PCN reaction that required hospitalization No Has patient had a PCN reaction occurring within the last 10 years: No If all of the above answers are "NO", then may proceed with Cephalosporin use.    Prednisone Hives   Wool Alcohol [Lanolin] Hives   Empagliflozin Nausea And Vomiting    Patient Measurements:   Heparin Dosing Weight: 82.3 kg  Vital Signs: Temp: 98.1 F (36.7 C) (06/19 1946) Temp Source: Oral (06/19 1946) BP: 139/82 (06/19 1946) Pulse Rate: 84 (06/19 1946)  Labs: Recent Labs    11/24/21 2133 11/24/21 2218 11/25/21 0806 11/26/21 0104  HGB 14.7 14.6  --  14.5  HCT 43.6 43.0  --  42.3  PLT 263  --   --  254  CREATININE 0.68  --   --  0.86  CKTOTAL  --   --  20*  --   TROPONINIHS 6  --  6  --     CrCl cannot be calculated (Unknown ideal weight.).   Medical History: Past Medical History:  Diagnosis Date   Anginal pain ([name]    Arthritis    "fingers" (03/09/2012)   Cold feet    "from my diabetes; they stay cold" (03/09/2012)   Coronary artery disease    Coronary artery spasm [facility])    since age 44 Dr Aleen Campi   COVID 07/06/2019   hospitalizied   Dyslipidemia    Femoral DVT (deep venous thrombosis) ([name]    left   Headache(784.0)    Hx of gallstones    Dr Derrell Lolling lap cholecystectomy   Hx of plastic surgery    plastic surgery following a fall off a loading dock ,lost  all his upper teeth    Hypertension    MI, old    at age 65   Migraines    Pneumonia ~ 1962; 1970's   "double"; "regular" (03/09/2012)    Rheumatoid arthritis ([name]    Dr Dierdre Forth    Slipped cervical disc 06/10/2006   Dr Patsi Sears in the past/ Dr Larita Fife , chiropractor in 2008   Thyroid disease    Dr Lucianne Muss   Type II diabetes mellitus ([name]     Medications:  Facility-Administered Medications Prior to Admission  Medication Dose Route Frequency Provider Last Rate Last Admin   Study - ORION 4 - inclisiran 300 mg/1.77mL or placebo SQ injection (PI-Stuckey)  300 mg Subcutaneous Q6 months Herby Abraham, MD       Medications Prior to Admission  Medication Sig Dispense Refill Last Dose   diltiazem (TIAZAC) 300 MG 24 hr capsule Take 1 capsule (300 mg total) by mouth daily. Pt. Needs to schedule overdue appt. With Dr. Anne Fu in order to receive future refills. Thank You. 2nd Attempt. 15 capsule 0 11/24/2021   folic acid (FOLVITE) 1 MG tablet Take 1 mg by mouth daily.   11/24/2021   HUMIRA PEN 40 MG/0.4ML PNKT Inject 0.4 mLs into the muscle every 14 (fourteen) days.   11/20/2021   insulin lispro (HUMALOG) 100 UNIT/ML KwikPen Inject 20-35 Units into the skin with breakfast, with lunch, and with evening meal.  11/24/2021   loratadine (CLARITIN) 10 MG tablet Take 10 mg by mouth daily as needed for allergies.   11/24/2021   methotrexate (RHEUMATREX) 2.5 MG tablet Take 15 mg by mouth every Wednesday. Caution:Chemotherapy. Protect from light.   Past Week   NOVOLOG 100 UNIT/ML injection Inject 20-35 Units into the skin at bedtime. Per sliding scale   11/24/2021   Study - ORION 4 - inclisiran 300 mg/1.74mL or placebo SQ injection (PI-Stuckey) Inject 1.5 mLs (300 mg total) into the skin every 6 (six) months. 1 mL 1 08/28/21   Insulin Glargine (BASAGLAR KWIKPEN) 100 UNIT/ML Inject 30 Units into the skin at bedtime. (Patient not taking: Reported on 11/25/2021)   Not Taking    Assessment: William Hunter is a 76 y.o. male with a PMH including MI at age 19, CAD and DVT. Presented d/t progressive chronic weakness. Neurology doing a full work up. CT  chest/abdomen resulted with an acute pulmonary embolism. Pharmacy consulted to start heparin infusion. Last dose of Lovenox 40 mg given 6/18 @ 2018. CBC wnl. Patient was not on anticoagulation prior to admission.   Goal of Therapy:  Heparin level 0.3-0.7 units/ml Monitor platelets by anticoagulation protocol: Yes   Plan:  F/u BL INR, aPTT, HL Give 5000 units bolus x 1 Start heparin infusion at 1400 units/hr Check anti-Xa level in 6 hours and daily while on heparin Continue to monitor H&H and platelets   Thank you for allowing [place] to participate in this patients care. Signe Colt, PharmD 11/26/2021 8:42 PM  **Pharmacist phone directory can be found on [email or web address] listed under [facility]**

## 2021-11-26 NOTE — Progress Notes (Signed)
Patient arrived to room 5W20 from ED.  Assessment complete, VS obtained, and Admission database began.

## 2021-11-26 NOTE — Progress Notes (Signed)
Physical Therapy Treatment Patient Details Name: William Hunter MRN: [id number] DOB: [birth year] Today's Date: 11/26/2021   History of Present Illness 76 y.o. male presents to [facility] hospital 11/24/2021 with progressive weakness over the last 3-4 months, unable to ambulate. PMH includes OA, CAD, HTN, RA, DMII.    PT Comments    Pt talking to wife on entry. Wife reports pt needs to use bedpan. PT suggest getting to [facility]. Pt limited in safe mobility by decreased awareness of safety in presence of generalized weakness especially in LE, and decreased balance.  Pt requires modA for bed mobility and is not able to come to fully upright with maxAx 2 due to RLE slipping out from under him. Pt unsafe for transfer to [facility], with help of NT pt returned to bed and rolled with modA for placement of bed pan. D/c plans remain appropriate at this time. PT will continue to follow acutely.   Recommendations for follow up therapy are one component of a multi-disciplinary discharge planning process, led by the attending physician.  Recommendations may be updated based on patient status, additional functional criteria and insurance authorization.  Follow Up Recommendations  Acute inpatient rehab (3hours/day)     Assistance Recommended at Discharge Frequent or constant Supervision/Assistance  Patient can return home with the following Two people to help with walking and/or transfers;Two people to help with bathing/dressing/bathroom;Assistance with cooking/housework;Assist for transportation;Help with stairs or ramp for entrance   Equipment Recommendations  Other (comment);Hospital bed (hoyer lift)    Recommendations for Other Services Rehab consult     Precautions / Restrictions Precautions Precautions: Fall Restrictions Weight Bearing Restrictions: No     Mobility  Bed Mobility Overal bed mobility: Needs Assistance Bed Mobility: Supine to Sit, Sit to Supine     Supine to sit: Max assist Sit to supine: Min  assist   General bed mobility comments: pt able to manage LE to EoB but requires maxA for bringing trunk to upright and for pad scoot of hips towards EoB    Transfers Overall transfer level: Needs assistance   Transfers: Sit to/from Stand Sit to Stand: From elevated surface, +2 physical assistance, Max assist           General transfer comment: with maxAx2 pt unable to come to fully upright as R LE sliding out from under him, unable to come to upright to pivot to [facility]    Ambulation/Gait               General Gait Details: unable       Balance Overall balance assessment: Needs assistance Sitting-balance support: Single extremity supported, Bilateral upper extremity supported, Feet unsupported Sitting balance-Leahy Scale: Poor Sitting balance - Comments: progressed from mod to min guard assist, posterio lean     Standing balance-Leahy Scale: Poor Standing balance comment: dependent on outside assist                            Cognition Arousal/Alertness: Awake/alert Behavior During Therapy: Flat affect Overall Cognitive Status: Impaired/Different from baseline Area of Impairment: Problem solving, Awareness, Safety/judgement                         Safety/Judgement: Decreased awareness of safety, Decreased awareness of deficits Awareness: Emergent Problem Solving: Slow processing, Difficulty sequencing, Requires verbal cues, Requires tactile cues, Decreased initiation General Comments: wife reports pt at his baseline, stubborn mainly  General Comments General comments (skin integrity, edema, etc.): VSS on RA, pt reports need to have BM      Pertinent Vitals/Pain Pain Assessment Pain Assessment: No/denies pain       Prior Function            PT Goals (current goals can now be found in the care plan section) Acute Rehab PT Goals PT Goal Formulation: With patient Time For Goal Achievement: 12/09/21 Potential to Achieve  Goals: Fair Progress towards PT goals: Progressing toward goals    Frequency    Min 3X/week      PT Plan Current plan remains appropriate       AM-PAC PT "6 Clicks" Mobility   Outcome Measure  Help needed turning from your back to your side while in a flat bed without using bedrails?: A Lot Help needed moving from lying on your back to sitting on the side of a flat bed without using bedrails?: A Lot Help needed moving to and from a bed to a chair (including a wheelchair)?: Total Help needed standing up from a chair using your arms (e.g., wheelchair or bedside chair)?: Total Help needed to walk in hospital room?: Total Help needed climbing 3-5 steps with a railing? : Total 6 Click Score: 8    End of Session Equipment Utilized During Treatment: Gait belt Activity Tolerance: Patient tolerated treatment well Patient left: in bed;with call bell/phone within reach;with bed alarm set;with family/visitor present Nurse Communication: Mobility status;Need for lift equipment PT Visit Diagnosis: Other abnormalities of gait and mobility (R26.89);Muscle weakness (generalized) (M62.81);Other symptoms and signs involving the nervous system (Y50.354)     Time: 6568-1275 PT Time Calculation (min) (ACUTE ONLY): 16 min  Charges:  $Therapeutic Activity: 8-22 mins                     Josejuan Hoaglin B. Beverely Risen PT, DPT Acute Rehabilitation Services Please use secure chat or  Call Office [phone]    Elon Alas [facility] 11/26/2021, 4:08 PM

## 2021-11-26 NOTE — Progress Notes (Signed)
Inpatient Rehab Admissions Coordinator:   Per therapy recommendations, patient was screened for CIR work up remains pending and I am not able to make an assessment of CIR candidacy yet. I will follow and rescreen once work up is complete. Please contact me any with questions.  Megan Salon, MS, CCC-SLP Rehab Admissions Coordinator  [phone] (celll) [phone] (office)

## 2021-11-26 NOTE — Progress Notes (Signed)
0820: discontinuation of order with placement of new order left me without coverage for AM CBG, Dr. Pola Corn notified, awaiting orders.   0830: MRI postponed until medication order for claustrophobia, Dr. Pola Corn notified, awaiting orders.

## 2021-11-26 NOTE — [place] FL2 (Signed)
[place] MEDICAID FL2 LEVEL OF CARE SCREENING TOOL     IDENTIFICATION  Patient Name: William Hunter Birthdate: 1945/08/05 Sex: male Admission Date (Current Location): 11/24/2021  [facility] and [place] Number:  Producer, television/film/video and Address:  [facility], [address], [place], [place] [address]      Provider Number: [id number]  Attending Physician Name and Address:  Lorin Glass, MD  Relative Name and Phone Number:       Current Level of Care: Hospital Recommended Level of Care: Skilled Nursing Facility Prior Approval Number:    Date Approved/Denied:   PASRR Number: [id number]  Discharge Plan: SNF    Current Diagnoses: Patient Active Problem List   Diagnosis Date Noted   Bilateral leg weakness 11/25/2021   Leg weakness, bilateral 11/25/2021   Lower extremity weakness 11/25/2021   Statin myopathy 02/22/2020   Acute respiratory failure with hypoxia ([name] 07/03/2019   Acute respiratory failure due to COVID-19 [facility]) 07/03/2019   Migraine with aura and without status migrainosus, not intractable 09/02/2016   Diabetic polyneuropathy associated with type 2 diabetes mellitus ([name] 09/02/2016   Rheumatoid arthritis ([name] 09/02/2016   Coronary vasospasm ([name] 04/06/2014   Gallstones 03/10/2012   Abdominal pain 03/09/2012   MI, old    Coronary artery disease    Hypertension    Dyslipidemia     Orientation RESPIRATION BLADDER Height & Weight     Self, Time, Situation, Place  Normal Incontinent, External catheter Weight:   Height:     BEHAVIORAL SYMPTOMS/MOOD NEUROLOGICAL BOWEL NUTRITION STATUS      Continent Diet (See dc summary)  AMBULATORY STATUS COMMUNICATION OF NEEDS Skin   Extensive Assist Verbally Normal                       Personal Care Assistance Level of Assistance  Bathing, Feeding, Dressing Bathing Assistance: Maximum assistance Feeding assistance: Independent Dressing Assistance: Limited assistance     Functional  Limitations Info  Sight Sight Info: Impaired        SPECIAL CARE FACTORS FREQUENCY  PT (By licensed PT), OT (By licensed OT)     PT Frequency: 5x/week OT Frequency: 5x/week            Contractures Contractures Info: Not present    Additional Factors Info  Code Status, Allergies, Insulin Sliding Scale Code Status Info: Full Allergies Info: Codeine, Penicillins, Prednisone, Wool Alcohol (Lanolin), Empagliflozin   Insulin Sliding Scale Info: See dc summary       Current Medications (11/26/2021):  This is the current hospital active medication list Current Facility-Administered Medications  Medication Dose Route Frequency Provider Last Rate Last Admin   acetaminophen (TYLENOL) tablet 650 mg  650 mg Oral Q6H PRN Dahal, Melina Schools, MD       Or   acetaminophen (TYLENOL) suppository 650 mg  650 mg Rectal Q6H PRN Dahal, Melina Schools, MD       acetaminophen (TYLENOL) tablet 650 mg  650 mg Oral Q6H PRN Howerter, Justin B, DO   650 mg at 11/25/21 2018   albuterol (PROVENTIL) (2.5 MG/3ML) 0.083% nebulizer solution 2.5 mg  2.5 mg Nebulization Q6H PRN Howerter, Justin B, DO       diltiazem (CARDIZEM CD) 24 hr capsule 300 mg  300 mg Oral Daily Howerter, Justin B, DO   300 mg at 11/26/21 0841   enoxaparin (LOVENOX) injection 40 mg  40 mg Subcutaneous Q24H Dahal, Melina Schools, MD   40 mg at 11/25/21  2018   hydrALAZINE (APRESOLINE) injection 10 mg  10 mg Intravenous Q6H PRN Dahal, Binaya, MD       insulin aspart (novoLOG) injection 0-15 Units  0-15 Units Subcutaneous TID WC Dahal, Binaya, MD   8 Units at 11/26/21 1246   insulin aspart (novoLOG) injection 0-5 Units  0-5 Units Subcutaneous QHS Dahal, Melina Schools, MD       insulin glargine-yfgn (SEMGLEE) injection 30 Units  30 Units Subcutaneous QHS Benjiman Core, MD   30 Units at 11/25/21 2355   loratadine (CLARITIN) tablet 10 mg  10 mg Oral Daily PRN Haskel Schroeder, PA-C       LORazepam (ATIVAN) injection 1 mg  1 mg Intravenous Once PRN Lorin Glass, MD        [START ON 11/28/2021] methotrexate (RHEUMATREX) tablet 15 mg  15 mg Oral Q Wed Badalamente, Peter R, PA-C       nystatin (MYCOSTATIN/NYSTOP) topical powder   Topical BID Lorin Glass, MD   Given at 11/26/21 [phone]     Discharge Medications: Please see discharge summary for a list of discharge medications.  Relevant Imaging Results:  Relevant Lab Results:   Additional Information SSN [id number]. Pfizer COVID-19 Vaccine 10/11/2019 , 09/16/2019  Mearl Latin, LCSW

## 2021-11-26 NOTE — Progress Notes (Signed)
TRH night cross cover note:   I was notified by RN of the patient's sinus tachycardia, with heart rates into the 120's in the setting of plan for admission to MedSurg for further evaluation and management of bilateral lower extremity weakness.  Per chart review, it appears that this tachycardia is new over the last few hours, progressing over that time.  In concert with my discussions with the admitting hospitalist, decision was made to change patient's level of care from MedSurg to cardiac telemetry.  I subsequently updated the patient's admission order to reflect this.  Per my additional discussions with the patient's RN, his heart rates have not been associated with any chest pain or shortness of breath.  Other vital signs are stable, including afebrile, and without hypotension.   I ordered EKG, which showed sinus tachycardia with heart rate 126, normal intervals, nonspecific T wave inversion in lead III, and no evidence of ST changes, including no evidence of ST elevation.  RN subsequently conveyed the patient's report that he is on daily Cardizem as an outpatient, and that he has not taken this medication yet today.  Per review of existing orders, patient's home Cardizem has been reordered, with first dose to occur tomorrow AM (6/19).  I subsequently updated this order to reflect first dose of home Cardizem to be administered now.    Newton Pigg, DO Hospitalist

## 2021-11-26 NOTE — TOC Initial Note (Signed)
Transition of Care [facility]) - Initial/Assessment Note    Patient Details  Name: William Hunter MRN: [id number] Date of Birth: November 28, 1945  Transition of Care [facility]) CM/SW Contact:    Mearl Latin, LCSW Phone Number: 11/26/2021, 1:01 PM  Clinical Narrative:                 CSW received consult for possible SNF placement at time of discharge if CIR is unable to accept due to Observation status. CSW spoke with patient. Patient reported that patient's spouse is currently unable to care for patient at their home given patient's current physical needs and fall risk. Patient expressed understanding of PT recommendation and is agreeable to SNF placement at time of discharge. Patient reports preference for a facility in [place]. CSW discussed insurance authorization process and will provide Medicare SNF ratings list. Patient has received COVID vaccines. CSW will send out referrals for review.   Skilled Nursing Rehab Facilities-   [email or web address]   Ratings out of 5 possible   Name Address  Phone # Quality Care Staffing Health Inspection Overall  [facility] [address], [place] [phone] 4 5 2 3   [facility]  [address], [place] [phone] 3 2 5 5   [facility] [address] [place], [address] [place] 3 1 1 1   [facility] [address] 3 2 4 4   [facility] [address] [phone] 1 1 2 1   [facility] [phone] N. [address] [phone] 2 1 4 3   [facility] [address] [place] 5 2 3 4   [facility] [address], [facility] [place] 5 2 2 3   [address] ([place]) [address] [facility] 5 1 2 2   [facility] [address], [place] [phone] 4 1 2 1   [facility] [address], [facility] [phone] 4 1 2 1   [facility] ([place]) [address] [facility],  [place] [phone] 4 1 1 1   [address] [facility] 3 2 4 4           [facility] [address], [facility] [place]      [facility] [address] [phone] 4 2 3 3   [facility] [place] [address] [phone] 4 1 5 4   [address], [place] [facility] [address], [place] [phone] 2 1 1 1   [facility] [address], [place] [phone] 2 1 3 2           [address] (no [facility] [place] [facility]) [address], [place] [phone] 4 5 5 5   [facility] (No Humana) [address] [place], [place] 3 1 4 3   [facility] (No UHC) [address], [place] [phone] 5 5 5 5   [facility] [address] [employer] [phone] 3 2 4 4   [facility] [address], [address] [facility] 1 1 2 1   [facility] [address] [phone] 2 1 1 1   [place] [address] [place] [phone] 5 2 4 5   [facility] [address], [place] 3 1 1 1   [facility] [address] [place], [place] [employer] 2 1 2 1           [facility] [address], [place] [phone] 1 1 1 1   [facility] [address] [phone]  [phone] 2 4 2 2   [facility] [place] [address], [place] [phone] 5 2 3 4   [facility] [place] [address] [place] [phone] 2 1 1 1   [facility] (No Humana) [address] [facility], [place] 2 1 3 2   [facility] [address] [phone] 3 1 1 1           [facility] [address] [place], [facility] 5 4 5 5   [facility]  [address] [phone] 2 2 3 3   [facility]) [address] [place] 3 2 4 4   [facility] [place] [address], [place] 4 3 4 4   [address] [place], [place] 3 3 1 1   [facility]) [address] [place] [phone] 2 2 4 4      Expected Discharge Plan: Skilled Nursing Facility Barriers to Discharge: Continued Medical Work up, [place], SNF Pending bed offer   Patient Goals and CMS Choice Patient states their goals for this hospitalization and ongoing recovery are:: Rehab CMS Medicare.gov Compare Post Acute Care list provided to:: Patient Choice offered to / list presented to : Patient  Expected Discharge Plan and Services Expected Discharge Plan: Skilled Nursing Facility In-house Referral: Clinical Social Work   Post Acute Care Choice: Skilled Nursing Facility Living arrangements for the past 2 months: Single Family Home                                      Prior Living Arrangements/Services Living arrangements for the past 2 months: Single Family Home Lives with:: Spouse Patient language and need for interpreter reviewed:: Yes Do you feel safe going back to the place where you live?: Yes      Need for Family Participation in Patient Care: No (Comment) Care giver support system in place?: Yes (comment) Current home services: [facility]) Criminal Activity/Legal Involvement Pertinent to Current Situation/Hospitalization: No - Comment as needed  Activities of Daily Living   ADL Screening (condition at time of admission) Patient's cognitive ability adequate to safely complete daily activities?: Yes Is the patient deaf or have difficulty hearing?: No Does the patient have difficulty seeing, even when wearing glasses/contacts?: No Does the patient have difficulty concentrating, remembering, or making decisions?: No Patient able to express need for assistance with ADLs?: Yes Does the patient have difficulty dressing or bathing?: No Independently performs ADLs?: Yes (appropriate for developmental age) Does the patient have difficulty walking or climbing stairs?: Yes Weakness of Legs: Both Weakness of  Arms/Hands: Both  Permission Sought/Granted Permission sought to share information with : Facility [address], Family Supports Permission granted to share information with : Yes, Verbal Permission Granted  Share Information with NAME: [place]  Permission granted to share info w AGENCY: SNFs  Permission granted to share info w Relationship: Spouse  Permission granted to share info w Contact Information: [phone]  Emotional Assessment Appearance:: Appears stated age Attitude/Demeanor/Rapport: Engaged Affect (typically observed): Accepting, Appropriate Orientation: : Oriented to Self, Oriented to Place, Oriented to  Time, Oriented to Situation Alcohol / Substance Use: Not Applicable Psych Involvement: No (comment)  Admission diagnosis:  Weakness [R53.1] Leg weakness, bilateral [R29.898] Lower extremity weakness [R29.898] Rheumatoid arthritis, involving unspecified site, unspecified whether rheumatoid factor present [facility]) [M06.9] Patient Active Problem List   Diagnosis Date Noted   Bilateral leg weakness 11/25/2021   Leg weakness,  bilateral 11/25/2021   Lower extremity weakness 11/25/2021   Statin myopathy 02/22/2020   Acute respiratory failure with hypoxia ([name] 07/03/2019   Acute respiratory failure due to COVID-19 ([name] 07/03/2019   Migraine with aura and without status migrainosus, not intractable 09/02/2016   Diabetic polyneuropathy associated with type 2 diabetes mellitus ([name] 09/02/2016   Rheumatoid arthritis ([name] 09/02/2016   Coronary vasospasm ([name] 04/06/2014   Gallstones 03/10/2012   Abdominal pain 03/09/2012   MI, old    Coronary artery disease    Hypertension    Dyslipidemia    PCP:  Irena Reichmann, DO Pharmacy:   [facility] [id number] - [address] [place] - [address] [facility] [address] [facility] [address] [place] [address] Phone: [phone] Fax: [phone]  [id number] [place], [place] - [address] [facility] [address] [facility]  ROAD [address] [place] [address] Phone: [phone] Fax: [phone]     Social Determinants of Health (SDOH) Interventions    Readmission Risk Interventions     No data to display

## 2021-11-26 NOTE — Consult Note (Deleted)
NEUROLOGY CONSULTATION NOTE   Date of service: November 26, 2021 Patient Name: William Hunter MRN:  [id number] DOB:  [birth year] Reason for consult: "increasing lower extremity weakness" Requesting physician: "Dr. Pola Corn" _ _ _   _ __   _ __ _ _  __ __   _ __   __ _  History of Present Illness   William Hunter is a 76 y.o. male with PMH significant for  has a past medical history of Anginal pain ([name], Arthritis, Cold feet, Coronary artery disease, Coronary artery spasm ([name], COVID (07/06/2019), Dyslipidemia, Femoral DVT (deep venous thrombosis) ([name], Headache(784.0), gallstones, plastic surgery, Hypertension, MI, old, Migraines, Pneumonia (~ 1962; 1970's), Rheumatoid arthritis ([name], Slipped cervical disc (06/10/2006), Thyroid disease, and Type II diabetes mellitus ([name]. who presents with  increasing weakness, greater in the lower extremities than the upper extremities and worst in the right lower extremity.  Weakness has been present for about two years but has been worsening over the last 2-3 months.  Patient has spent most of his time in his recliner and states that he has recently been having his knees buckle when he attempts to stand up.  He also reports a 70 pound weight loss over the last three months despite normal appetite and normal food intake.  He denies bowel issues and states that he also has had no cognitive issues, trouble speaking or other neurologic symptoms.    ROS   Per HPI: all other systems reviewed and are negative  Past History   I have reviewed the following:  Past Medical History:  Diagnosis Date   Anginal pain ([name]    Arthritis    "fingers" (03/09/2012)   Cold feet    "from my diabetes; they stay cold" (03/09/2012)   Coronary artery disease    Coronary artery spasm [facility])    since age 37 Dr Aleen Campi   COVID 07/06/2019   hospitalizied   Dyslipidemia    Femoral DVT (deep venous thrombosis) ([name]    left   Headache(784.0)    Hx of gallstones    Dr Derrell Lolling lap  cholecystectomy   Hx of plastic surgery    plastic surgery following a fall off a loading dock ,lost  all his upper teeth    Hypertension    MI, old    at age 37   Migraines    Pneumonia ~ 1962; 1970's   "double"; "regular" (03/09/2012)   Rheumatoid arthritis ([name]    Dr Dierdre Forth    Slipped cervical disc 06/10/2006   Dr Patsi Sears in the past/ Dr Larita Fife , chiropractor in 2008   Thyroid disease    Dr Lucianne Muss   Type II diabetes mellitus [facility])    Past Surgical History:  Procedure Laterality Date   CARDIAC CATHETERIZATION     x 3 with NO stents    CHOLECYSTECTOMY  03/11/2012   Procedure: LAPAROSCOPIC CHOLECYSTECTOMY WITH INTRAOPERATIVE CHOLANGIOGRAM;  Surgeon: Ernestene Mention, MD;  Location: [facility] OR;  Service: General;  Laterality: N/A;   KYPHOPLASTY N/A 05/30/2021   Procedure: THORACIC FIVE KYPHOPLASTY;  Surgeon: Coletta Memos, MD;  Location: [facility] OR;  Service: Neurosurgery;  Laterality: N/A;  THORACIC FIVE KYPHOPLASTY   PENILE PROSTHESIS IMPLANT  1983   Family History  Problem Relation Age of Onset   Heart failure Mother    Cancer - Other Mother        breast   Heart disease Father    Cancer - Other Sister  tongue cancer   Heart attack Sister    Social History   Socioeconomic History   Marital status: Married    Spouse name: Talbert Forest   Number of children: 0   Years of education: 14   Highest education level: Not on file  Occupational History    Comment: [employer]  Tobacco Use   Smoking status: Never   Smokeless tobacco: Never  Vaping Use   Vaping Use: Never used  Substance and Sexual Activity   Alcohol use: No   Drug use: No   Sexual activity: Yes  Other Topics Concern   Not on file  Social History Narrative   Lives with wife   Caffeine- Diet Coke 5 daily   Social Determinants of Health   Financial Resource Strain: Not on file  Food Insecurity: Not on file  Transportation Needs: Not on file  Physical Activity: Not on file  Stress: Not on file   Social Connections: Not on file   Allergies  Allergen Reactions   Codeine Swelling   Penicillins Hives and Swelling    Has patient had a PCN reaction causing immediate rash, facial/tongue/throat swelling, SOB or lightheadedness with hypotension: Yes Has patient had a PCN reaction causing severe rash involving mucus membranes or skin necrosis: No Has patient had a PCN reaction that required hospitalization No Has patient had a PCN reaction occurring within the last 10 years: No If all of the above answers are "NO", then may proceed with Cephalosporin use.    Prednisone Hives   Wool Alcohol [Lanolin] Hives   Empagliflozin Nausea And Vomiting    Medications   Facility-Administered Medications Prior to Admission  Medication Dose Route Frequency Provider Last Rate Last Admin   Study - ORION 4 - inclisiran 300 mg/1.34mL or placebo SQ injection (PI-Stuckey)  300 mg Subcutaneous Q6 months Herby Abraham, MD       Medications Prior to Admission  Medication Sig Dispense Refill Last Dose   diltiazem (TIAZAC) 300 MG 24 hr capsule Take 1 capsule (300 mg total) by mouth daily. Pt. Needs to schedule overdue appt. With Dr. Anne Fu in order to receive future refills. Thank You. 2nd Attempt. 15 capsule 0 11/24/2021   folic acid (FOLVITE) 1 MG tablet Take 1 mg by mouth daily.   11/24/2021   HUMIRA PEN 40 MG/0.4ML PNKT Inject 0.4 mLs into the muscle every 14 (fourteen) days.   11/20/2021   insulin lispro (HUMALOG) 100 UNIT/ML KwikPen Inject 20-35 Units into the skin with breakfast, with lunch, and with evening meal.   11/24/2021   loratadine (CLARITIN) 10 MG tablet Take 10 mg by mouth daily as needed for allergies.   11/24/2021   methotrexate (RHEUMATREX) 2.5 MG tablet Take 15 mg by mouth every Wednesday. Caution:Chemotherapy. Protect from light.   Past Week   NOVOLOG 100 UNIT/ML injection Inject 20-35 Units into the skin at bedtime. Per sliding scale   11/24/2021   Study - ORION 4 - inclisiran 300 mg/1.71mL  or placebo SQ injection (PI-Stuckey) Inject 1.5 mLs (300 mg total) into the skin every 6 (six) months. 1 mL 1 08/28/21   Insulin Glargine (BASAGLAR KWIKPEN) 100 UNIT/ML Inject 30 Units into the skin at bedtime. (Patient not taking: Reported on 11/25/2021)   Not Taking      Current Facility-Administered Medications:    acetaminophen (TYLENOL) tablet 650 mg, 650 mg, Oral, Q6H PRN **OR** acetaminophen (TYLENOL) suppository 650 mg, 650 mg, Rectal, Q6H PRN, Dahal, Melina Schools, MD   acetaminophen (TYLENOL) tablet  650 mg, 650 mg, Oral, Q6H PRN, Howerter, Justin B, DO, 650 mg at 11/25/21 2018   albuterol (PROVENTIL) (2.5 MG/3ML) 0.083% nebulizer solution 2.5 mg, 2.5 mg, Nebulization, Q6H PRN, Howerter, Justin B, DO   diltiazem (CARDIZEM CD) 24 hr capsule 300 mg, 300 mg, Oral, Daily, Howerter, Justin B, DO, 300 mg at 11/26/21 0841   enoxaparin (LOVENOX) injection 40 mg, 40 mg, Subcutaneous, Q24H, Dahal, Binaya, MD, 40 mg at 11/25/21 2018   hydrALAZINE (APRESOLINE) injection 10 mg, 10 mg, Intravenous, Q6H PRN, Dahal, Binaya, MD   insulin aspart (novoLOG) injection 0-15 Units, 0-15 Units, Subcutaneous, TID WC, Dahal, Binaya, MD   insulin aspart (novoLOG) injection 0-5 Units, 0-5 Units, Subcutaneous, QHS, Dahal, Binaya, MD   insulin glargine-yfgn (SEMGLEE) injection 30 Units, 30 Units, Subcutaneous, QHS, Pickering, Harrold Donath, MD, 30 Units at 11/25/21 2355   loratadine (CLARITIN) tablet 10 mg, 10 mg, Oral, Daily PRN, Haskel Schroeder, PA-C   LORazepam (ATIVAN) injection 1 mg, 1 mg, Intravenous, Once PRN, Dahal, Melina Schools, MD   [START ON 11/28/2021] methotrexate (RHEUMATREX) tablet 15 mg, 15 mg, Oral, Q Wed, Badalamente, Peter R, PA-C   nystatin (MYCOSTATIN/NYSTOP) topical powder, , Topical, BID, Lorin Glass, MD, Given at 11/26/21 0842  Vitals   Vitals:   11/26/21 0000 11/26/21 0050 11/26/21 0400 11/26/21 0816  BP: (!) 161/86 (!) 150/80 122/85 104/60  Pulse: 90 94 77 80  Resp: Temp:  98.4 F (36.9  C) 98.4 F (36.9 C) 98 F (36.7 C)  TempSrc:  Oral Oral Oral  SpO2: 97% 97% 95% 94%     There is no height or weight on file to calculate BMI.  Physical Exam   Physical Exam Gen: A&O x4, NAD HEENT: Atraumatic, normocephalic;mucous membranes moist; oropharynx clear, tongue without atrophy or fasciculations. Neck: Supple, trachea midline. Extrem: Nml bulk; no cyanosis, clubbing, or edema.  Neuro: MS: A&O x4. Follows multi-step commands.  Speech: fluid, nondysarthric, able to repeat CN:    I: Deferred   II,III: PERRLA, VFF by confrontation   III,IV,VI: EOMI w/o nystagmus, no ptosis   V: Sensation intact from V1 to V3 to LT   VII: Eyelid closure was full.  Smile symmetric.   VIII: Hearing intact to voice   IX,X: Voice normal XII: Tongue protrudes midline, no atrophy or fasciculations   Motor:   Normal bulk.  No tremor, rigidity or bradykinesia. No pronator drift. 5/5 throughout BUE except 4+/5 grip bilat. BLE 4+/5 throughout except 4/5 HF bilat.  Sensory: Intact to light touch throughout. Symmetric. Propioception intact bilat.    Coordination:  Finger-to-nose, rapid alternating motions were intact. Reflexes:  2+ and symmetric throughout without clonus; toes down-going bilat Gait: deferred  Labs   CBC:  Recent Labs  Lab 11/24/21 2133 11/24/21 2218 11/26/21 0104  WBC 11.0*  --  10.0  NEUTROABS 8.3*  --   --   HGB 14.7 14.6 14.5  HCT 43.6 43.0 42.3  MCV 93.6  --  92.4  PLT 263  --  254    Basic Metabolic Panel:  Lab Results  Component Value Date   NA 137 11/26/2021   K 3.4 (L) 11/26/2021   CO2 21 (L) 11/26/2021   GLUCOSE 269 (H) 11/26/2021   BUN 20 11/26/2021   CREATININE 0.86 11/26/2021   CALCIUM 8.9 11/26/2021   GFRNONAA >60 11/26/2021   GFRAA >60 07/05/2019   Lipid Panel:  Lab Results  Component Value Date   LDLCALC 139 (H) 08/08/2020  HgbA1c:  Lab Results  Component Value Date   HGBA1C 9.7 (H) 11/25/2021   Urine Drug Screen: No results found  for: "LABOPIA", "COCAINSCRNUR", "LABBENZ", "AMPHETMU", "THCU", "LABBARB"  Alcohol Level No results found for: "ETH"  CT Head without contrast: No acute hemorrhage or infarct, stable ventriculomegaly, small vessel ischemic disease  CT thoracic and lumbar spine anterior superior endplate fracture at T11, stable thoracic kyphosis moderate L5-S1 foraminal stenosis, mild disc bulging at L3-4 and L4-5  MRI brain/c/t   Impression   76 year old patient with progressive weakness worst in right lower extremity over a two year period.  Patient states that he has lately spent most of his time in his recliner and that his knees buckle when he stands up.  Patient also endorses a 70 pound unintentional weight loss over the last three months despite normal intake and appetite.  Will need workup for malignancy with CT chest, abdomen and pelvis as well as MRI brain and cervical spine.  Differential is broad and includes myelopathy, myositis, deconditioning due to inactivity, malignancy and motor neuropathy.  Recommendations  MRI brain and c-spine PT/OT evaluation and treatment, may need inpatient rehabilitation CT chest, abdomen and pelvis to exclude malignancy If CT CAP negative, lumbar puncture to obtain CSF sample ______________________________________________________________________   Thank you for the opportunity to take part in the care of this patient. If you have any further questions, please contact the neurology consultation attending.  Signed,  Bing Neighbors, MD [facility] [phone]  If 7pm- 7am, please page neurology on call as listed in AMION.   Addendum 2051: Interval data:  MRI brain wwo  No evidence of recent infarction, hemorrhage, or mass. No abnormal enhancement.   Moderate chronic microvascular ischemic changes.   Chronic disproportionate ventricular prominence likely reflecting central volume loss. Not consistent with NPH on my review  MRI SPINE    CERVICAL SPINE   1. No spinal canal stenosis or spinal cord abnormality. 2. Multilevel uncovertebral and facet arthropathy, which causes severe left neural foraminal narrowing at C4-C5, mild bilateral neural foraminal narrowing at C3-C4 and C6-C7, and mild left neural foraminal narrowing at C2-C3 and C5-C6.   THORACIC SPINE   1. No spinal canal stenosis or spinal cord abnormality. 2. Increased T2 signal and contrast enhancement in the intervertebral discs from T6-T12, which is nonspecific but favored to represent degenerative changes and edema given normal signal in the adjacent endplates and vertebral bodies, although early discitis can appear similar. Correlate with lab values. 3. Anterior superior endplate deformity at T11 is favored to be subacute to chronic. No definite acute fracture in the thoracic spine. 4. Mild bilateral neural foraminal narrowing at T10-T11.   LUMBAR SPINE   1. No spinal canal stenosis or spinal cord abnormality. 2. L5-S1 mild-to-moderate bilateral neural foraminal narrowing.  CNS imaging personally reviewed; I agree with above interpretations  No MRI findings to explain progressive BLE weakness. CT c/a/p did not show clear e/o malignancy, possible benign renal finding to be further evaluated with dedicated renal MRI, but did show PE. Patient to be started on heparin gtt. Will hold on LP for now.  Bing Neighbors, MD [facility] [phone]  If 7pm- 7am, please page neurology on call as listed in AMION.

## 2021-11-27 ENCOUNTER — Inpatient Hospital Stay (HOSPITAL_COMMUNITY): Payer: Medicare HMO

## 2021-11-27 DIAGNOSIS — I2699 Other pulmonary embolism without acute cor pulmonale: Secondary | ICD-10-CM | POA: Diagnosis not present

## 2021-11-27 DIAGNOSIS — I5032 Chronic diastolic (congestive) heart failure: Secondary | ICD-10-CM | POA: Diagnosis not present

## 2021-11-27 DIAGNOSIS — Z86718 Personal history of other venous thrombosis and embolism: Secondary | ICD-10-CM

## 2021-11-27 DIAGNOSIS — R29898 Other symptoms and signs involving the musculoskeletal system: Secondary | ICD-10-CM | POA: Diagnosis not present

## 2021-11-27 LAB — ECHOCARDIOGRAM COMPLETE
AR max vel: 3.42 cm2
AV Area VTI: 4.41 cm2
AV Area mean vel: 3.36 cm2
AV Mean grad: 2 mmHg
AV Peak grad: 4 mmHg
Ao pk vel: 1 m/s
Area-P 1/2: 3.6 cm2
Height: 71.5 in
Weight: 2903.02 oz

## 2021-11-27 LAB — HIV-1/HIV-2 QUALITATIVE RNA
Final Interpretation: NEGATIVE
HIV-1 RNA, Qualitative: NONREACTIVE
HIV-2 RNA, Qualitative: NONREACTIVE

## 2021-11-27 LAB — VITAMIN E
Vitamin E (Alpha Tocopherol): 9.8 mg/L (ref 9.0–29.0)
Vitamin E(Gamma Tocopherol): 1.7 mg/L (ref 0.5–4.9)

## 2021-11-27 LAB — HEPARIN LEVEL (UNFRACTIONATED)
Heparin Unfractionated: 0.77 IU/mL — ABNORMAL HIGH (ref 0.30–0.70)
Heparin Unfractionated: 0.75 IU/mL — ABNORMAL HIGH (ref 0.30–0.70)
Heparin Unfractionated: 0.43 IU/mL (ref 0.30–0.70)

## 2021-11-27 LAB — GLUCOSE, CAPILLARY
Glucose-Capillary: 212 mg/dL — ABNORMAL HIGH (ref 70–99)
Glucose-Capillary: 210 mg/dL — ABNORMAL HIGH (ref 70–99)
Glucose-Capillary: 278 mg/dL — ABNORMAL HIGH (ref 70–99)
Glucose-Capillary: 236 mg/dL — ABNORMAL HIGH (ref 70–99)

## 2021-11-27 LAB — HIV-1/2 AB - DIFFERENTIATION
HIV 1 Ab: NONREACTIVE
HIV 2 Ab: NONREACTIVE
Note: NEGATIVE

## 2021-11-27 MED ORDER — PERFLUTREN LIPID MICROSPHERE
1.0000 mL | INTRAVENOUS | Status: AC | PRN
  Administered 2021-11-27: 3 mL via INTRAVENOUS

## 2021-11-27 MED ORDER — SENNOSIDES-DOCUSATE SODIUM 8.6-50 MG PO TABS
1.0000 | ORAL_TABLET | Freq: Every day | ORAL | Status: DC
  Filled 2021-11-27: qty 1

## 2021-11-27 MED ORDER — BISACODYL 5 MG PO TBEC: 5.0000 mg | DELAYED_RELEASE_TABLET | Freq: Every day | ORAL | Status: DC | PRN

## 2021-11-27 MED ORDER — FOLIC ACID 1 MG PO TABS
1.0000 mg | ORAL_TABLET | Freq: Every day | ORAL | Status: DC
  Administered 2021-11-27 – 2021-11-29 (×3): 1 mg via ORAL
  Filled 2021-11-27 (×3): qty 1

## 2021-11-27 MED ORDER — POLYETHYLENE GLYCOL 3350 17 G PO PACK: 17.0000 g | PACK | Freq: Every day | ORAL | Status: DC | PRN

## 2021-11-27 NOTE — Progress Notes (Signed)
Inpatient Diabetes Program Recommendations  AACE/ADA: New Consensus Statement on Inpatient Glycemic Control (2015)  Target Ranges:  Prepandial:   less than 140 mg/dL      Peak postprandial:   less than 180 mg/dL (1-2 hours)      Critically ill patients:  140 - 180 mg/dL   Lab Results  Component Value Date   GLUCAP 210 (H) 11/27/2021   HGBA1C 9.7 (H) 11/25/2021    Review of Glycemic Control  Diabetes history: DM2 Outpatient Diabetes medications: Humalog 20-35 TID, Basaglar 30 QHS Current orders for Inpatient glycemic control: Semglee 40 QHS, Novolog 0-15 units TID with meals and 0-5 HS  CBGs 181, 215 mg/dL UXNA3F - 5.7%  Inpatient Diabetes Program Recommendations:    Add Novolog 3 units TID with meals if eating > 50%.   Will follow.  Thank you. Ailene Ards, RD, LDN, CDE Inpatient Diabetes Coordinator [phone]

## 2021-11-27 NOTE — Progress Notes (Signed)
TRH night cross cover note:   I was notified by RN of radiology report that today's CT chest, abdomen, pelvis with contrast (not CTA) to evaluate for any underlying malignancy contributing to the patient's presenting bilateral lower extremity weakness, has demonstrated evidence of acute pulmonary embolism in the proximal right lower pulmonary artery, with moderate overall clot burden in the absence of any evidence of right heart strain.  No evidence of hypotension, and heart rate noted to be in the 80s.  Patient is currently ordered Lovenox 40 mg subcu daily for DVT prophylaxis.  Otherwise not on any blood thinners at this time.  I subsequently discontinued Lovenox and consulted inpatient pharmacy for initiation of heparin drip.  I have also ordered echocardiogram for the morning.     Newton Pigg, DO Hospitalist

## 2021-11-27 NOTE — Progress Notes (Signed)
ANTICOAGULATION CONSULT NOTE - Follow Up Consult  Pharmacy Consult for Heparin Indication: pulmonary embolus  Allergies  Allergen Reactions   Codeine Swelling   Penicillins Hives and Swelling    Has patient had a PCN reaction causing immediate rash, facial/tongue/throat swelling, SOB or lightheadedness with hypotension: Yes Has patient had a PCN reaction causing severe rash involving mucus membranes or skin necrosis: No Has patient had a PCN reaction that required hospitalization No Has patient had a PCN reaction occurring within the last 10 years: No If all of the above answers are "NO", then may proceed with Cephalosporin use.    Prednisone Hives   Wool Alcohol [Lanolin] Hives   Empagliflozin Nausea And Vomiting    Patient Measurements: Height: 5' 11.5" (181.6 cm) Weight: 82.3 kg (181 lb 7 oz) IBW/kg (Calculated) : 76.45 Heparin Dosing Weight: 82.3 kg  Vital Signs: Temp: 98.8 F (37.1 C) (06/20 1635) Temp Source: Oral (06/20 1635) BP: 121/71 (06/20 1635) Pulse Rate: 88 (06/20 1635)  Labs: Recent Labs    11/24/21 2133 11/24/21 2218 11/25/21 0806 11/26/21 0104 11/26/21 2053 11/26/21 2053 11/27/21 0316 11/27/21 0928 11/27/21 1854  HGB 14.7 14.6  --  14.5  --   --   --   --   --   HCT 43.6 43.0  --  42.3  --   --   --   --   --   PLT 263  --   --  254  --   --   --   --   --   APTT  --   --   --   --  26  --   --   --   --   LABPROT  --   --   --   --  13.0  --   --   --   --   INR  --   --   --   --  1.0  --   --   --   --   HEPARINUNFRC  --   --   --   --  <0.10*   < > 0.77* 0.75* 0.43  CREATININE 0.68  --   --  0.86  --   --   --   --   --   CKTOTAL  --   --  20*  --   --   --   --   --   --   TROPONINIHS 6  --  6  --   --   --   --   --   --    < > = values in this interval not displayed.     Estimated Creatinine Clearance: 82.8 mL/min (by C-G formula based on SCr of 0.86 mg/dL).  Assessment: DMARION PERFECT is a 76 y.o. male with a PMH including MI at  age 68, CAD and DVT. Presented d/t progressive chronic weakness. Neurology doing a full work up. CT chest/abdomen on 11/26/21 resulted with an acute pulmonary embolism. Pharmacy consulted to start heparin infusion. Last dose of Lovenox 40 mg given 6/18 @ 2018. CBC wnl. Patient was not on anticoagulation prior to admission.  Heparin level therapeutic (0.43) on infusion at 1300 units/hr. No bleeding noted.   Goal of Therapy:  Heparin level 0.3-0.7 units/ml Monitor platelets by anticoagulation protocol: Yes   Plan:  Continue heparin drip at 1300 units/hr F/u a.m. heparin level  Christoper Fabian, PharmD, BCPS Please see amion for complete clinical pharmacist phone  list 11/27/2021,8:07 PM

## 2021-11-27 NOTE — Plan of Care (Deleted)
Neurology plan of care  Please see full neurology consult note from yesterday for full findings and recommendations. Patient is on heparin gtt for PE and therefore unable to get LP. I actually think an EMG/NCS would be more useful as the next step in evaluation than a LP would be and EMG can only be done in the outpatient setting. I will arrange outpatient neurology f/u. No further inpatient neurologic workup indicated at this time. Neurology to sign off, but please re-engage if additional neurologic concerns arise.  Bing Neighbors, MD [facility] [phone]  If 7pm- 7am, please page neurology on call as listed in AMION.

## 2021-11-27 NOTE — Progress Notes (Signed)
Lower extremity venous bilateral study completed.  Preliminary results relayed to [place], MD.   See CV Proc for preliminary results report.   Davyd Podgorski, RDMS, RVT  

## 2021-11-27 NOTE — Progress Notes (Signed)
Inpatient Rehab Admissions Coordinator:   Per therapy recommendations, patient was screened for CIR candidacy by Megan Salon, MS, CCC-SLP  At this time, Pt. does not appear to demonstrate medical necessity to justify in hospital rehabilitation/CIR.Marland Kitchen Additionally, per current payor trends, Pt.'s payor will not approve CIR for his diagnoses. I  will not pursue a rehab consult for this Pt.   Recommend other rehab venues to be pursued.  Please contact me with any questions.   Megan Salon, MS, CCC-SLP Rehab Admissions Coordinator  [phone] (celll) [phone] (office)

## 2021-11-27 NOTE — Progress Notes (Signed)
Neurology progress note  S: Strength unchanged. NAEON.   Interval data:  CT c/a/p did not show clear e/o malignancy, possible benign renal finding to be further evaluated with dedicated renal MRI, but did show PE.   MRI brain wwo   No evidence of recent infarction, hemorrhage, or mass. No abnormal enhancement.   Moderate chronic microvascular ischemic changes.   Chronic disproportionate ventricular prominence likely reflecting central volume loss. Not consistent with NPH on my review  MRI SPINE    CERVICAL SPINE   1. No spinal canal stenosis or spinal cord abnormality. 2. Multilevel uncovertebral and facet arthropathy, which causes severe left neural foraminal narrowing at C4-C5, mild bilateral neural foraminal narrowing at C3-C4 and C6-C7, and mild left neural foraminal narrowing at C2-C3 and C5-C6.   THORACIC SPINE   1. No spinal canal stenosis or spinal cord abnormality. 2. Increased T2 signal and contrast enhancement in the intervertebral discs from T6-T12, which is nonspecific but favored to represent degenerative changes and edema given normal signal in the adjacent endplates and vertebral bodies, although early discitis can appear similar. Correlate with lab values. 3. Anterior superior endplate deformity at T11 is favored to be subacute to chronic. No definite acute fracture in the thoracic spine. 4. Mild bilateral neural foraminal narrowing at T10-T11.   LUMBAR SPINE   1. No spinal canal stenosis or spinal cord abnormality. 2. L5-S1 mild-to-moderate bilateral neural foraminal narrowing.   CNS imaging personally reviewed; I agree with above interpretations  O:  Vitals:   11/27/21 1153 11/27/21 1635  BP: 130/84 121/71  Pulse: 84 88  Resp: 17 20  Temp: 98.5 F (36.9 C) 98.8 F (37.1 C)  SpO2: 98% 97%    Physical Exam  HEENT-  [place]/AT    Lungs- Respirations unlabored Extremities- No edema   Neurological Examination Mental Status: Awake, alert and  oriented. Speech fluent without evidence of aphasia.  Able to follow all commands without difficulty. Cranial Nerves: II: Temporal visual fields intact with no extinction to DSS. PERRL.  III,IV, VI: No ptosis. EOMI. No nystagmus V: Temp and FT sensation equal bilaterally.  VII: Smile symmetric VIII: Hearing intact to voice IX,X: No hypophonia or hoarseness XI: Symmetric shoulder shrug XII: Midline tongue extension. No tongue atrophy or fasciculations seen. Motor: Decreased muscle bulk x 4, with a proximal-distal gradient of increasing atrophy, worse more distally, including hand intrinsics and muscles of the distal lower extremities below the knees and including the feet bilaterally. No fasciculations seen.  BUE strength is 4/5 proximally and 4-/5 distally, including deltoids, biceps, triceps, grip, finger extension, finger abduction and pincer grasp LLE: 4/5 hip flexion, knee extension, knee flexion; 3-4/5 ADF and APF RLE 3/5 hip flexion, 4-/5 knee extension, knee flexion, 3/5 ADF and APF Sensory: Temp and light touch intact in upper and lower extremities proximally and distally including the hands and feet. Mild proprioceptive deficit to toes bilaterally.  Deep Tendon Reflexes: 1+ bilateral brachioradialis, biceps and patellae. 0 bilateral achilles.  Plantars: Mute bilaterally  Cerebellar: No ataxia with FNF bilaterally. No rest or action tremor noted.  Gait: Unable to assess due to weakness  Assessment: 76 year old male with progressive BLE weakness over a 2 year period, with recently accelerating rate of decline.  1. Exam reveals diffuse limb weakness, worse in the lower extremities than upper extremities, seen in conjunction with diffuse muscle atrophy, worse distally. His leg weakness is asymmetric, right worse than left. No significant sensory loss.  2. CT head: No acute  intracranial hemorrhage or infarct. Stable ventriculomegaly, slightly disproportionate to the degree of parenchymal  volume loss but overall not militating in favor of NPH. Stable periventricular white matter changes, likely reflecting the sequela of small vessel ischemia. 3. MRI neuroaxis shows no findings to explain progressive BLE weakness. 4. Exam findings best localize to muscle. DDx includes inflammatory myositis, severe deconditioning due to decreased mobility, a diffuse motor neuropathy or multifactorial etiology. He has depressed reflexes but they are only absent at the achilles tendons, which would not be unusual for an elderly patient with diabetes. No respiratory symptoms, bulbar weakness or fasciculations to suggest ALS.  5. CT c/a/p did not show clear e/o malignancy, possible benign renal finding to be further evaluated with dedicated renal MRI, but did show PE.   Patient is now on heparin gtt for PE and therefore unable to get LP. I actually think an EMG/NCS would be more useful as the next step in evaluation than a LP would be and EMG can only be done in the outpatient setting. I will arrange outpatient neurology f/u. No further inpatient neurologic workup indicated at this time. Neurology to sign off, but please re-engage if additional neurologic concerns arise.  Bing Neighbors, MD [facility] [phone]  If 7pm- 7am, please page neurology on call as listed in AMION.

## 2021-11-27 NOTE — Progress Notes (Signed)
ANTICOAGULATION CONSULT NOTE -  Pharmacy Consult for heparin Indication: pulmonary embolus Brief A/P: Heparin level slightly supratherapeutic Continue Heparin at current rate for now  Allergies  Allergen Reactions   Codeine Swelling   Penicillins Hives and Swelling    Has patient had a PCN reaction causing immediate rash, facial/tongue/throat swelling, SOB or lightheadedness with hypotension: Yes Has patient had a PCN reaction causing severe rash involving mucus membranes or skin necrosis: No Has patient had a PCN reaction that required hospitalization No Has patient had a PCN reaction occurring within the last 10 years: No If all of the above answers are "NO", then may proceed with Cephalosporin use.    Prednisone Hives   Wool Alcohol [Lanolin] Hives   Empagliflozin Nausea And Vomiting    Patient Measurements: Weight: 82.3 kg (181 lb 7 oz) Heparin Dosing Weight: 82.3 kg  Vital Signs: Temp: 98.8 F (37.1 C) (06/19 2338) Temp Source: Oral (06/19 2338) BP: 136/84 (06/19 2338) Pulse Rate: 88 (06/19 2338)  Labs: Recent Labs    11/24/21 2133 11/24/21 2218 11/25/21 0806 11/26/21 0104 11/26/21 2053 11/27/21 0316  HGB 14.7 14.6  --  14.5  --   --   HCT 43.6 43.0  --  42.3  --   --   PLT 263  --   --  254  --   --   APTT  --   --   --   --  26  --   LABPROT  --   --   --   --  13.0  --   INR  --   --   --   --  1.0  --   HEPARINUNFRC  --   --   --   --  <0.10* 0.77*  CREATININE 0.68  --   --  0.86  --   --   CKTOTAL  --   --  20*  --   --   --   TROPONINIHS 6  --  6  --   --   --      CrCl cannot be calculated (Unknown ideal weight.).   Assessment: 76 y.o. male with PE for heparin.  Level drawn relatively soon after infusion/bolus begun--expect level to decrease with additional time  Goal of Therapy:  Heparin level 0.3-0.7 units/ml Monitor platelets by anticoagulation protocol: Yes   Plan:  Continue Heparin at current rate for now Recheck heparin level in 6  hours.   Geannie Risen, PharmD, BCPS

## 2021-11-27 NOTE — TOC Progression Note (Signed)
Transition of Care [facility]) - Progression Note    Patient Details  Name: William Hunter MRN: [id number] Date of Birth: [birth year]  Transition of Care [facility]) CM/SW [place], LCSW Phone Number: 11/27/2021, 4:40 PM  Clinical Narrative:    CSW met with patient and his wife at bedside and provided SNF bed offers. They have selected [facility]. CSW will have them start insurance authorization process once dc date is known.    Expected Discharge Plan: [place] Barriers to Discharge: Continued Medical Work up, Ship broker, SNF Pending bed offer  Expected Discharge Plan and Services Expected Discharge Plan: [place] In-house Referral: Clinical Social Work   Post Acute Care Choice: [place] Living arrangements for the past 2 months: Single Family Home                                       Social Determinants of Health (SDOH) Interventions    Readmission Risk Interventions     No data to display

## 2021-11-27 NOTE — Progress Notes (Signed)
PROGRESS NOTE  William Hunter  DOB: 01-10-1946  PCP: [name], DO UTM:[id number]  DOA: 11/24/2021  LOS: 1 day  Hospital Day: 4  Brief narrative: William Hunter is a 76 y.o. male with PMH significant for rheumatoid arthritis of multiple sites on Humira followed by rheumatology at [facility], primary osteoarthritis of both knees followed by orthopedic surgery, type 2 diabetes, hypertension, hyperlipidemia, coronary artery disease, chronic diastolic CHF. Patient was brought to the ED by EMS on 6/17 with complaint of progressive chronic right-sided weakness.  He has progressive weakness over 2-year period with recently accelerating rate of decline.  His weakness has become so severe that he has recently begun to follow with knees buckling when trying to get out of bed to recliner.  He has been confined to his recliner due to this for past 2 weeks.  No bowel/bladder incontinence.  He receiving PT OT at home and was in process of getting more hours of physical therapy at home.  Because of accelerating decline, his family brought him to the ED with a hope to get him to CIR.   In the ED, he was hemodynamically stable Initial labs with unremarkable CBC and BMP except for elevated blood glucose to 300s. CT head did not show any acute intracranial abnormality.   Patient was seen by neurology Dr. Cheral Marker.  On his exam, patient noted to have diffuse limb weakness worse in the lower extremities, diffuse muscle atrophy. Admitted to hospital service MRI brain, cervical spine, thoracic and lumbar spine were obtained MRI brain:  No evidence of infarction, hemorrhage, mass.  Moderate chronic microvascular ischemic changes.  Chronic disproportionate ventricular prominence likely reflecting central volume loss with possible normal pressure hydrocephalus. MRI cervical/thoracic/lumbar spine: -No evidence of spinal canal stenosis or cord abnormality -Multilevel uncovertebral and facet arthropathy causing  multilevel neuroforaminal narrowing at this cervical, thoracic as well as lumbar level.  See below for details.  Subjective: Patient was seen and examined this morning.   Lying down in bed.  Not in distress.  No new symptoms.   No family at bedside.  Events of last night noted. Patient was diagnosed to have moderate burden pulm embolism and was started on heparin drip overnight.  Principal Problem:   Leg weakness, bilateral Active Problems:   Bilateral leg weakness   Lower extremity weakness    Assessment and plan: Progressive bilateral lower extremity weakness  -Ongoing for last 2 years, recently accelerating rate of decline.   -In the setting of primary osteoarthritis and rheumatoid arthritis of multiple sites -MRI brain with possibility of normal pressure hydrocephalus.  Neurology following.   -Because of the dramatic nature of his weakness, neurology wants to rule out a malignancy.  CT chest, abdomen pelvis was obtained.  Did not show any evidence of malignancy. -PT eval obtained.  CIR recommended. -Per neurology, patient could get EMG/NCS as an outpatient.  Acute pulmonary embolism -6/19 CT angio chest abdomen and pelvis was obtained with the primary objective to rule out malignancy.  He did not show any malignancy but showed an acute pulm embolism within the proximal right lower pulmonary artery with moderate clot burden -Patient has been started on heparin drip.  I will continue heparin drip for next 24 hours.  Can consider NOAC prior to discharge.  Uncontrolled type 2 diabetes mellitus with hyperglycemia -A1c 9.7 on 6/18 -Home meds include Basaglar 30 units nightly, Humalog sliding scale 3 times daily. -Patient is currently on Semglee 30 units nightly along with  sliding scale insulin.  Blood sugar level remains elevated over 200.  I will increase Semglee to 35 units for tonight. Recent Labs  Lab 11/26/21 1613 11/26/21 1950 11/26/21 2220 11/27/21 0753 11/27/21 1156   GLUCAP 311* 280* 265* 212* 210*   Chronic diastolic CHF Essential hypertension -Heart rate and blood pressure currently controlled on Cardizem 300 mg daily   CAD/HLD -I do not see statin or any antiplatelet/anticoagulant in his list   Rheumatoid arthritis -On Humira pain every 2 weeks, methotrexate 15 mg every Wednesday.  Penile implant -Patient had a penile implant placed in 1980s.  Urologist after male with reached out on admission to see MRI compatibility of the implant.  MRI was approved.   Goals of care   Code Status: Full Code    Mobility: PT eval ordered  Skin assessment:     Nutritional status:  Body mass index is 24.95 kg/m.          Diet:  Diet Order             Diet Carb Modified Fluid consistency: Thin; Room service appropriate? No  Diet effective now                   DVT prophylaxis:     Antimicrobials: None Fluid: None Consultants: Neurology Family Communication: None at bedside  Status is: Observation  Continue in-hospital care because: Pending CIR Level of care: Telemetry Cardiac   Dispo: The patient is from: Home              Anticipated d/c is to: Pending clinical course              Patient currently is not medically stable to d/c.   Difficult to place patient No     Infusions:   heparin 1,300 Units/hr (11/27/21 1220)    Scheduled Meds:  diltiazem  300 mg Oral Daily   folic acid  1 mg Oral Daily   insulin aspart  0-15 Units Subcutaneous TID WC   insulin aspart  0-5 Units Subcutaneous QHS   insulin glargine-yfgn  35 Units Subcutaneous QHS   [START ON 11/28/2021] methotrexate  15 mg Oral Q Wed   nystatin   Topical BID   senna-docusate  1 tablet Oral QHS    PRN meds: acetaminophen **OR** acetaminophen, acetaminophen, albuterol, bisacodyl, hydrALAZINE, loratadine, LORazepam, perflutren lipid microspheres (DEFINITY) IV suspension, polyethylene glycol   Antimicrobials: Anti-infectives (From admission, onward)     None       Objective: Vitals:   11/27/21 0753 11/27/21 1153  BP: 139/83 130/84  Pulse: 87 84  Resp: 18 17  Temp: 98.3 F (36.8 C) 98.5 F (36.9 C)  SpO2: 97% 98%    Intake/Output Summary (Last 24 hours) at 11/27/2021 1341 Last data filed at 11/27/2021 1203 Gross per 24 hour  Intake --  Output 1100 ml  Net -1100 ml   Filed Weights   11/26/21 2044  Weight: 82.3 kg   Weight change:  Body mass index is 24.95 kg/m.   Physical Exam: General exam: Pleasant, elderly Caucasian male.  Not in physical distress Skin: No rashes, lesions or ulcers. HEENT: Atraumatic, normocephalic, no obvious bleeding Lungs: Clear to auscultation bilaterally CVS: Regular rate and rhythm, no murmur GI/Abd soft, nontender, nondistended, bowel sound present CNS: Alert, awake, oriented x3.  Bilateral lower extremity weaknesses. Psychiatry: Mood appropriate Extremities: No pedal edema no calf tenderness  Data Review: I have personally reviewed the laboratory data and studies available.  F/u  labs ordered [employer] (From admission, onward)     Start     Ordered   11/29/21 0500  CBC  Daily,   R     Question:  Specimen collection method  Answer:  Lab=Lab collect   11/27/21 0825   11/28/21 0500  Heparin level (unfractionated)  Daily,   R     Question:  Specimen collection method  Answer:  Lab=Lab collect   11/26/21 2047   11/28/21 0500  CBC with Differential/Platelet  Tomorrow morning,   R       Question:  Specimen collection method  Answer:  Lab=Lab collect   11/27/21 0751   11/28/21 3448  Basic metabolic panel  Tomorrow morning,   R       Question:  Specimen collection method  Answer:  Lab=Lab collect   11/27/21 0751   11/27/21 1900  Heparin level (unfractionated)  Once-Timed,   TIMED       Question:  Specimen collection method  Answer:  Lab=Lab collect   11/27/21 1049   11/25/21 1101  Vitamin B6  Once,   URGENT        11/25/21 1106   11/25/21 1100  Vitamin B1  Once,   URGENT         11/25/21 1106   11/25/21 1100  Multiple Myeloma Panel (SPEP&IFE w/QIG)  Once,   URGENT        11/25/21 1106   11/25/21 1100  Vitamin E  Once,   URGENT        11/25/21 1106   11/25/21 1100  HIV-1/2 AB - differentiation  Once,   URGENT        11/25/21 1106   11/25/21 0757  Miscellaneous [facility] test (send-out)  Once,   URGENT       Question:  Test name / description:  Answer:  [facility] Myositis Panel   11/25/21 0757            Signed, Terrilee Croak, MD [facility] 11/27/2021

## 2021-11-27 NOTE — Progress Notes (Signed)
ANTICOAGULATION CONSULT NOTE - Follow Up Consult  Pharmacy Consult for Heparin Indication: pulmonary embolus  Allergies  Allergen Reactions   Codeine Swelling   Penicillins Hives and Swelling    Has patient had a PCN reaction causing immediate rash, facial/tongue/throat swelling, SOB or lightheadedness with hypotension: Yes Has patient had a PCN reaction causing severe rash involving mucus membranes or skin necrosis: No Has patient had a PCN reaction that required hospitalization No Has patient had a PCN reaction occurring within the last 10 years: No If all of the above answers are "NO", then may proceed with Cephalosporin use.    Prednisone Hives   Wool Alcohol [Lanolin] Hives   Empagliflozin Nausea And Vomiting    Patient Measurements: Height: 5' 11.5" (181.6 cm) Weight: 82.3 kg (181 lb 7 oz) IBW/kg (Calculated) : 76.45 Heparin Dosing Weight: 82.3 kg  Vital Signs: Temp: 98.3 F (36.8 C) (06/20 0753) Temp Source: Oral (06/20 0753) BP: 139/83 (06/20 0753) Pulse Rate: 87 (06/20 0753)  Labs: Recent Labs    11/24/21 2133 11/24/21 2218 11/25/21 0806 11/26/21 0104 11/26/21 2053 11/27/21 0316 11/27/21 0928  HGB 14.7 14.6  --  14.5  --   --   --   HCT 43.6 43.0  --  42.3  --   --   --   PLT 263  --   --  254  --   --   --   APTT  --   --   --   --  26  --   --   LABPROT  --   --   --   --  13.0  --   --   INR  --   --   --   --  1.0  --   --   HEPARINUNFRC  --   --   --   --  <0.10* 0.77* 0.75*  CREATININE 0.68  --   --  0.86  --   --   --   CKTOTAL  --   --  20*  --   --   --   --   TROPONINIHS 6  --  6  --   --   --   --     Estimated Creatinine Clearance: 82.8 mL/min (by C-G formula based on SCr of 0.86 mg/dL).  Assessment: William Hunter is a 76 y.o. male with a PMH including MI at age 80, CAD and DVT. Presented d/t progressive chronic weakness. Neurology doing a full work up. CT chest/abdomen on 11/26/21 resulted with an acute pulmonary embolism. Pharmacy  consulted to start heparin infusion. Last dose of Lovenox 40 mg given 6/18 @ 2018. CBC wnl. Patient was not on anticoagulation prior to admission.   Initial heparin level (0.77) drawn ~ 5 hours after bolus and infusion begun was slightly supratherpeutic.  Infusion rate of 1400 units/hr continued, as level possibly still reflecting bolus dose to some degree.  Repeat level ~6 hours later is 0.75, still above goal.  Lower extremity duplex pending.  Goal of Therapy:  Heparin level 0.3-0.7 units/ml Monitor platelets by anticoagulation protocol: Yes   Plan:  Decrease heparin drip to 1300 units/hr Heparin level ~8 hrs after rate change. Daily heparin level and CBC while on heparin. Follow up LE duplex.  Dennie Fetters, RPh 11/27/2021,10:54 AM

## 2021-11-27 NOTE — Progress Notes (Signed)
  Echocardiogram 2D Echocardiogram has been performed.  William Hunter 11/27/2021, 11:54 AM

## 2021-11-28 ENCOUNTER — Other Ambulatory Visit (HOSPITAL_COMMUNITY): Payer: Self-pay

## 2021-11-28 DIAGNOSIS — R29898 Other symptoms and signs involving the musculoskeletal system: Secondary | ICD-10-CM | POA: Diagnosis not present

## 2021-11-28 DIAGNOSIS — M069 Rheumatoid arthritis, unspecified: Secondary | ICD-10-CM | POA: Diagnosis not present

## 2021-11-28 LAB — GLUCOSE, CAPILLARY
Glucose-Capillary: 181 mg/dL — ABNORMAL HIGH (ref 70–99)
Glucose-Capillary: 215 mg/dL — ABNORMAL HIGH (ref 70–99)
Glucose-Capillary: 248 mg/dL — ABNORMAL HIGH (ref 70–99)
Glucose-Capillary: 315 mg/dL — ABNORMAL HIGH (ref 70–99)

## 2021-11-28 LAB — CBC WITH DIFFERENTIAL/PLATELET
Abs Immature Granulocytes: 0.08 10*3/uL — ABNORMAL HIGH (ref 0.00–0.07)
Basophils Absolute: 0.1 10*3/uL (ref 0.0–0.1)
Basophils Relative: 0 %
Eosinophils Absolute: 0.2 10*3/uL (ref 0.0–0.5)
Eosinophils Relative: 1 %
HCT: 45.5 % (ref 39.0–52.0)
Hemoglobin: 16 g/dL (ref 13.0–17.0)
Immature Granulocytes: 1 %
Lymphocytes Relative: 12 %
Lymphs Abs: 1.7 10*3/uL (ref 0.7–4.0)
MCH: 32 pg (ref 26.0–34.0)
MCHC: 35.2 g/dL (ref 30.0–36.0)
MCV: 91 fL (ref 80.0–100.0)
Monocytes Absolute: 1 10*3/uL (ref 0.1–1.0)
Monocytes Relative: 7 %
Neutro Abs: 11.4 10*3/uL — ABNORMAL HIGH (ref 1.7–7.7)
Neutrophils Relative %: 79 %
Platelets: 268 10*3/uL (ref 150–400)
RBC: 5 MIL/uL (ref 4.22–5.81)
RDW: 14.2 % (ref 11.5–15.5)
WBC: 14.3 10*3/uL — ABNORMAL HIGH (ref 4.0–10.5)
nRBC: 0 % (ref 0.0–0.2)

## 2021-11-28 LAB — BASIC METABOLIC PANEL
Anion gap: 12 (ref 5–15)
BUN: 15 mg/dL (ref 8–23)
CO2: 22 mmol/L (ref 22–32)
Calcium: 9.1 mg/dL (ref 8.9–10.3)
Chloride: 99 mmol/L (ref 98–111)
Creatinine, Ser: 0.75 mg/dL (ref 0.61–1.24)
GFR, Estimated: >60 mL/min (ref >60)
Glucose, Bld: 181 mg/dL — ABNORMAL HIGH (ref 70–99)
Potassium: 3.5 mmol/L (ref 3.5–5.1)
Sodium: 133 mmol/L — ABNORMAL LOW (ref 135–145)

## 2021-11-28 LAB — HEPARIN LEVEL (UNFRACTIONATED): Heparin Unfractionated: 0.53 IU/mL (ref 0.30–0.70)

## 2021-11-28 LAB — VITAMIN B1: Vitamin B1 (Thiamine): 155.6 nmol/L (ref 66.5–200.0)

## 2021-11-28 MED ORDER — APIXABAN 5 MG PO TABS
10.0000 mg | ORAL_TABLET | Freq: Two times a day (BID) | ORAL | Status: DC
  Administered 2021-11-28 – 2021-11-29 (×3): 10 mg via ORAL
  Filled 2021-11-28 (×3): qty 2

## 2021-11-28 MED ORDER — ONDANSETRON HCL 4 MG/2ML IJ SOLN
4.0000 mg | Freq: Four times a day (QID) | INTRAMUSCULAR | Status: DC | PRN
  Administered 2021-11-28 – 2021-11-29 (×2): 4 mg via INTRAVENOUS
  Filled 2021-11-28 (×2): qty 2

## 2021-11-28 MED ORDER — INSULIN GLARGINE-YFGN 100 UNIT/ML ~~LOC~~ SOLN
40.0000 [IU] | Freq: Every day | SUBCUTANEOUS | Status: DC
Start: 2021-11-28 — End: 2021-11-29
  Administered 2021-11-28: 40 [IU] via SUBCUTANEOUS
  Filled 2021-11-28 (×2): qty 0.4

## 2021-11-28 MED ORDER — APIXABAN 5 MG PO TABS: 5.0000 mg | ORAL_TABLET | Freq: Two times a day (BID) | ORAL | Status: DC

## 2021-11-28 NOTE — TOC Progression Note (Addendum)
Transition of Care [facility]) - Progression Note    Patient Details  Name: William Hunter MRN: [id number] Date of Birth: 15-Feb-1946  Transition [facility]) CM/SW Contact  Mearl Latin, LCSW Phone Number: 11/28/2021, 1:43 PM  Clinical Narrative:    11am-[facility] has started insurance authorization process.   4pm-[facility] has received insurance approval for patient. CSW left voicemail for patient's spouse to go there to do paperwork at 10am tomorrow.   Expected Discharge Plan: Skilled Nursing Facility Barriers to Discharge: Continued Medical Work up, English as a second language teacher, SNF Pending bed offer  Expected Discharge Plan and Services Expected Discharge Plan: Skilled Nursing Facility In-house Referral: Clinical Social Work   Post Acute Care Choice: Skilled Nursing Facility Living arrangements for the past 2 months: Single Family Home                                       Social Determinants of Health (SDOH) Interventions    Readmission Risk Interventions     No data to display

## 2021-11-28 NOTE — Progress Notes (Addendum)
ANTICOAGULATION CONSULT NOTE - Follow Up Consult  Pharmacy Consult for Heparin > Eliquis Indication: pulmonary embolus and DVT LLE  Allergies  Allergen Reactions   Codeine Swelling   Penicillins Hives and Swelling    Has patient had a PCN reaction causing immediate rash, facial/tongue/throat swelling, SOB or lightheadedness with hypotension: Yes Has patient had a PCN reaction causing severe rash involving mucus membranes or skin necrosis: No Has patient had a PCN reaction that required hospitalization No Has patient had a PCN reaction occurring within the last 10 years: No If all of the above answers are "NO", then may proceed with Cephalosporin use.    Prednisone Hives   Wool Alcohol [Lanolin] Hives   Empagliflozin Nausea And Vomiting    Patient Measurements: Height: 5' 11.5" (181.6 cm) Weight: 82.3 kg (181 lb 7 oz) IBW/kg (Calculated) : 76.45 Heparin Dosing Weight: 82.3 kg  Vital Signs: Temp: 98.2 F (36.8 C) (06/21 0816) Temp Source: Oral (06/21 0816) BP: 141/86 (06/21 0816) Pulse Rate: 100 (06/21 0816)  Labs: Recent Labs    11/26/21 0104 11/26/21 2053 11/27/21 0316 11/27/21 0928 11/27/21 1854 11/28/21 0449  HGB 14.5  --   --   --   --  16.0  HCT 42.3  --   --   --   --  45.5  PLT 254  --   --   --   --  268  APTT  --  26  --   --   --   --   LABPROT  --  13.0  --   --   --   --   INR  --  1.0  --   --   --   --   HEPARINUNFRC  --  <0.10*   < > 0.75* 0.43 0.53  CREATININE 0.86  --   --   --   --  0.75   < > = values in this interval not displayed.     Estimated Creatinine Clearance: 89 mL/min (by C-G formula based on SCr of 0.75 mg/dL).  Assessment: William Hunter is a 76 y.o. male with a PMH including MI at age 73, CAD and DVT. Presented d/t progressive chronic weakness. Neurology doing a full work up. CT chest/abdomen on 11/26/21 resulted with an acute pulmonary embolism. Pharmacy consulted to start heparin infusion. Last dose of Lovenox 40 mg given 6/18 @  2018. CBC wnl. Patient was not on anticoagulation prior to admission.   Heparin level remains therapeutic (0.53) on 1300 units/hr. CBC stable.  Age indeterminate LLE DVT per duplex 11/27/21.  Goal of Therapy:  Heparin level 0.3-0.7 units/ml Appropriate Eliquis regimen for indication Monitor platelets by anticoagulation protocol: Yes   Plan:  Continue heparin drip at 1300 units/hr Daily heparin level and CBC while on heparin. Follow up transition to oral agent. Monitor for signs/symptoms of bleeding.  11am addendum: to transition to Eliquis.  - Eliquis 10 mg BID x 7 days, then 5 mg BID.  - stop IV heparin when giving first dose of Eliquis.  Dennie Fetters, RPh 11/28/2021,9:09 AM

## 2021-11-28 NOTE — Progress Notes (Signed)
PROGRESS NOTE        PATIENT DETAILS Name: William Hunter Age: 76 y.o. Sex: male Date of Birth: Oct 12, 1945 Admit Date: 11/24/2021 Admitting Physician William Mura, DO [id number], William Dyer, DO  Brief Summary: William Hunter is a 76 y.o. male with PMH significant for rheumatoid arthritis of multiple sites on Humira followed by rheumatology at [facility], primary osteoarthritis of both knees followed by orthopedic surgery, type 2 diabetes, hypertension, hyperlipidemia, coronary artery disease, chronic diastolic CHF. Pt endorse progressive weakness over 2-year period with recent accelerating rate of decline was brought to the ED by EMS with complaint of progressive chronic right-sided weakness. Neurology was consulted; CT angio chest abdomen and pelvis showed an acute pulm embolism within the proximal right lower pulmonary artery with moderate clot burden and patient was started on heparin drip.  Significant events: 06/19 >>> Pt admitted to the hospital   Significant studies: 06/19 >>> CT Chest Abd / pelvis with Contrast: Acute pulmonary embolism within the proximal RIGHT lower pulmonary artery. Overall clot burden is moderate. No RIGHT ventricular strain.  06/19 >>> CT head did not show any acute intracranial abnormality.  06/19 >>> MRI brain: No evidence of infarction, hemorrhage, mass.  Moderate chronic microvascular ischemic changes. Chronic disproportionate ventricular prominence likely reflecting central volume loss with possible normal pressure hydrocephalus. MRI cervical/thoracic/lumbar spine: No evidence of spinal canal stenosis or cord abnormality. Multilevel uncovertebral and facet arthropathy causing multilevel neuroforaminal narrowing at this cervical, thoracic as well as lumbar level.  Significant microbiology data: Hepatitis A, B and C  negative, non reactive.  HIV tests negative, non reactive  Consults: Neurology  following Urology  Subjective: "I have had better days. I can't get any sleep". Pt lying in bed-denies any chest pain or shortness of breath.  Objective: Vitals: Blood pressure (!) 141/86, pulse 100, temperature 98.2 F (36.8 C), temperature source Oral, resp. rate 20, height 5' 11.5" (1.816 m), weight 82.3 kg, SpO2 96 %.   Exam: Gen Exam: Alert, awake-not in any distress HEENT:atraumatic,  Chest: B/L clear to auscultation anteriorly CVS:S1S2 regular Abdomen: BS x4, abd rounded, soft non tender, non distended Extremities:MAE x4, weakness and drift RLE, no edema Neurology: alert, answer appropriately, Non focal Skin: no rash  Pertinent Labs/Radiology:    Latest Ref Rng & Units 11/28/2021    4:49 AM 11/26/2021    1:04 AM 11/24/2021   10:18 PM  CBC  WBC 4.0 - 10.5 K/uL 14.3  10.0    Hemoglobin 13.0 - 17.0 g/dL 16.0  14.5  14.6   Hematocrit 39.0 - 52.0 % 45.5  42.3  43.0   Platelets 150 - 400 K/uL 268  254      Lab Results  Component Value Date   NA 133 (L) 11/28/2021   K 3.5 11/28/2021   CL 99 11/28/2021   CO2 22 11/28/2021     Assessment and Plan: Progressive generalized bilateral lower extremity weakness  Ongoing for last 2 years, recently accelerating rate of decline.   -In the setting of primary osteoarthritis and rheumatoid arthritis of multiple sites 6/19 > MRI brain with possibility of normal pressure hydrocephalus.  Neurology following.   6/ 19> CT chest, abdomen pelvis shows not malignancy but, acute pulmonary embolism within the proximal RIGHT lower pulmonary artery. Overall clot burden is moderate. No RIGHT ventricular strain. - Continue anticoagulation change  heparin drip to Eliquis. - PT/OT eval and treat  Uncontrolled type 2 diabetes mellitus with hyperglycemia A1c 9.7 on 6/18: Home meds include Basaglar 30 units nightly,  Humalog sliding scale 3 times daily. CBG (last 3)  Recent Labs    11/27/21 1635 11/27/21 2037 11/28/21 0836  GLUCAP 278* 236*  181*    - continue SSI and adjust appropriately -continue Semglee 35 units nightly along with sliding scale insulin.    Sinus tachycardia Pt had an event of tachycardic up to 120's but resolved once Cardizem was resumed, heart rate improved to normal. - Continue cardizem.   Essential hypertension -PTA on Cardizem 300 mg daily  Rheumatoid arthritis -On Humira pain every 2 weeks, methotrexate 15 mg every Wednesday.  CAD/HLD - pt don't have any statin or any antiplatelet/anticoagulant in his list - Check lipid panel - continue eliquis  Penile implant (From 1980's) -Patient had a penile implant, was cleared from urology to have MRI; implant compatible with MRI.   BMI/Obesity: Estimated body mass index is 24.95 kg/m as calculated from the following:   Height as of this encounter: 5' 11.5" (1.816 m).   Weight as of this encounter: 82.3 kg.   Code status:   Code Status: Full Code   DVT Prophylaxis: Eliquis   Family Communication: None at bedside  Disposition Plan: Status is: Inpatient Remains inpatient appropriate because: pending SNF   Planned Discharge Destination:Skilled nursing facility  Diet: Diet Order             Diet Carb Modified Fluid consistency: Thin; Room service appropriate? No  Diet effective now                   Antimicrobial agents: Anti-infectives (From admission, onward)    None       MEDICATIONS: Scheduled Meds:  diltiazem  300 mg Oral Daily   folic acid  1 mg Oral Daily   insulin aspart  0-15 Units Subcutaneous TID WC   insulin aspart  0-5 Units Subcutaneous QHS   insulin glargine-yfgn  35 Units Subcutaneous QHS   methotrexate  15 mg Oral Q Wed   nystatin   Topical BID   senna-docusate  1 tablet Oral QHS   Continuous Infusions:  heparin 1,300 Units/hr (11/28/21 0855)   PRN Meds:.acetaminophen **OR** acetaminophen, acetaminophen, albuterol, bisacodyl, hydrALAZINE, loratadine, LORazepam, polyethylene glycol   I have  personally reviewed following labs and imaging studies  LABORATORY DATA: CBC: Recent Labs  Lab 11/24/21 2133 11/24/21 2218 11/26/21 0104 11/28/21 0449  WBC 11.0*  --  10.0 14.3*  NEUTROABS 8.3*  --   --  11.4*  HGB 14.7 14.6 14.5 16.0  HCT 43.6 43.0 42.3 45.5  MCV 93.6  --  92.4 91.0  PLT 263  --  254 268    Basic Metabolic Panel: Recent Labs  Lab 11/24/21 2133 11/24/21 2218 11/26/21 0104 11/28/21 0449  NA 137 136 137 133*  K 3.9 3.8 3.4* 3.5  CL 104  --  104 99  CO2 21*  --  21* 22  GLUCOSE 304*  --  269* 181*  BUN 23  --  20 15  CREATININE 0.68  --  0.86 0.75  CALCIUM 8.9  --  8.9 9.1  MG 1.7  --   --   --     GFR: Estimated Creatinine Clearance: 89 mL/min (by C-G formula based on SCr of 0.75 mg/dL).  Liver Function Tests: Recent Labs  Lab 11/24/21 2133  AST 12*  ALT 15  ALKPHOS 68  BILITOT 0.4  PROT 6.5  ALBUMIN 3.2*   Recent Labs  Lab 11/24/21 2133  LIPASE 41   No results for input(s): "AMMONIA" in the last 168 hours.  Coagulation Profile: Recent Labs  Lab 11/26/21 2053  INR 1.0    Cardiac Enzymes: Recent Labs  Lab 11/25/21 0806  CKTOTAL 20*    BNP (last 3 results) No results for input(s): "PROBNP" in the last 8760 hours.  Lipid Profile: No results for input(s): "CHOL", "HDL", "LDLCALC", "TRIG", "CHOLHDL", "LDLDIRECT" in the last 72 hours.  Thyroid Function Tests: No results for input(s): "TSH", "T4TOTAL", "FREET4", "T3FREE", "THYROIDAB" in the last 72 hours.  Anemia Panel: Recent Labs    11/25/21 1259  VITAMINB12 353  FOLATE 14.8    Urine analysis:    Component Value Date/Time   COLORURINE YELLOW 11/24/2021 2024   APPEARANCEUR CLEAR 11/24/2021 2024   LABSPEC 1.032 (H) 11/24/2021 2024   PHURINE 5.0 11/24/2021 2024   GLUCOSEU >=500 (A) 11/24/2021 2024   HGBUR NEGATIVE 11/24/2021 2024   BILIRUBINUR NEGATIVE 11/24/2021 2024   KETONESUR 20 (A) 11/24/2021 2024   PROTEINUR NEGATIVE 11/24/2021 2024   NITRITE NEGATIVE  11/24/2021 2024   LEUKOCYTESUR NEGATIVE 11/24/2021 2024    Sepsis Labs: Lactic Acid, Venous    Component Value Date/Time   LATICACIDVEN 1.3 07/02/2019 2037    MICROBIOLOGY: No results found for this or any previous visit (from the past 240 hour(s)).  RADIOLOGY STUDIES/RESULTS: VAS [place] LOWER EXTREMITY VENOUS (DVT)  Result Date: 11/27/2021  Lower Venous DVT Study Patient Name:  William Hunter  Date of Exam:   11/27/2021 Medical Rec #: [id number]      Accession #:    [id number] Date of Birth: 1946/01/26      Patient Gender: M Patient Age:   20 years Exam Location:  [facility] Procedure:      VAS [place] LOWER EXTREMITY VENOUS (DVT) Referring Phys: Terrilee Croak --------------------------------------------------------------------------------  Indications: Pulmonary embolism, and history of left DVT.  Anticoagulation: Heparin. Comparison Study: 07-03-2019 Prior left lower extremity venous was positive for                   acute DVT involving the profunda femoral vein. Performing Technologist: Darlin Coco RDMS, RVT  Examination Guidelines: A complete evaluation includes B-mode imaging, spectral Doppler, color Doppler, and power Doppler as needed of all accessible portions of each vessel. Bilateral testing is considered an integral part of a complete examination. Limited examinations for reoccurring indications may be performed as noted. The reflux portion of the exam is performed with the patient in reverse Trendelenburg.  +---------+---------------+---------+-----------+----------+--------------+ RIGHT    CompressibilityPhasicitySpontaneityPropertiesThrombus Aging +---------+---------------+---------+-----------+----------+--------------+ CFV      Full           Yes      Yes                                 +---------+---------------+---------+-----------+----------+--------------+ SFJ      Full                                                         +---------+---------------+---------+-----------+----------+--------------+ FV Prox  Full                                                        +---------+---------------+---------+-----------+----------+--------------+  FV Mid   Full                                                        +---------+---------------+---------+-----------+----------+--------------+ FV DistalFull                                                        +---------+---------------+---------+-----------+----------+--------------+ PFV      Full                                                        +---------+---------------+---------+-----------+----------+--------------+ POP      Full           Yes      Yes                                 +---------+---------------+---------+-----------+----------+--------------+ PTV      Full                                                        +---------+---------------+---------+-----------+----------+--------------+ PERO     Full                                                        +---------+---------------+---------+-----------+----------+--------------+   +---------+---------------+---------+-----------+----------+-----------------+ LEFT     CompressibilityPhasicitySpontaneityPropertiesThrombus Aging    +---------+---------------+---------+-----------+----------+-----------------+ CFV      Partial        Yes      Yes                  Acute             +---------+---------------+---------+-----------+----------+-----------------+ SFJ      Partial        Yes      Yes                  Acute             +---------+---------------+---------+-----------+----------+-----------------+ FV Prox  None           No       No                   Acute             +---------+---------------+---------+-----------+----------+-----------------+ FV Mid   None           No       No                   Age Indeterminate  +---------+---------------+---------+-----------+----------+-----------------+ FV DistalNone           No  No                   Age Indeterminate +---------+---------------+---------+-----------+----------+-----------------+ PFV      Full                                                           +---------+---------------+---------+-----------+----------+-----------------+ POP      None           No       No                   Age Indeterminate +---------+---------------+---------+-----------+----------+-----------------+ PTV      Full                                                           +---------+---------------+---------+-----------+----------+-----------------+ PERO     None           No       No                   Acute             +---------+---------------+---------+-----------+----------+-----------------+ Gastroc  Full                                                           +---------+---------------+---------+-----------+----------+-----------------+ EIV                     Yes      Yes                                    +---------+---------------+---------+-----------+----------+-----------------+     Summary: RIGHT: - There is no evidence of deep vein thrombosis in the lower extremity.  - No cystic structure found in the popliteal fossa.  LEFT: - Findings consistent with acute deep vein thrombosis involving the left common femoral vein, SF junction, left femoral vein, and left peroneal veins.  - Findings consistent with age indeterminate deep vein thrombosis involving the left popliteal vein.  - No cystic structure found in the popliteal fossa.  - The common femoral vein obstruction does not appear to extend above inguinal ligament.  *See table(s) above for measurements and observations. Electronically signed by Deitra Mayo MD on 11/27/2021 at 4:46:40 PM.    Final    ECHOCARDIOGRAM COMPLETE  Result Date: 11/27/2021    ECHOCARDIOGRAM  REPORT   Patient Name:   William Hunter Date of Exam: 11/27/2021 Medical Rec #:  [id number]     Height:       71.5 in Accession #:    [id number]    Weight:       181.4 lb Date of Birth:  September 01, 1945     BSA:          2.034 m Patient Age:    73 years      BP:  141/70 mmHg Patient Gender: M             HR:           89 bpm. Exam Location:  Inpatient Procedure: 2D Echo, Cardiac Doppler, Color Doppler and Intracardiac            Opacification Agent Indications:    Pulmonary embolus  History:        Patient has prior history of Echocardiogram examinations, most                 recent 02/22/2020. Previous Myocardial Infarction and CAD; Risk                 Factors:Diabetes and Hypertension.  Sonographer:    Ross Ludwig RDCS (AE) Referring Phys: [id number] Angie Fava  Sonographer Comments: No subcostal window, Technically challenging study due to limited acoustic windows, Technically difficult study due to poor echo windows, suboptimal parasternal window and suboptimal apical window. IMPRESSIONS  1. Left ventricular ejection fraction, by estimation, is 60 to 65%. The left ventricle has normal function. The left ventricle has no regional wall motion abnormalities. Left ventricular diastolic parameters are consistent with Grade I diastolic dysfunction (impaired relaxation).  2. Right ventricular systolic function is normal. The right ventricular size is normal. Tricuspid regurgitation signal is inadequate for assessing PA pressure.  3. The mitral valve was not well visualized. No evidence of mitral valve regurgitation.  4. The aortic valve was not well visualized. Aortic valve regurgitation is not visualized. No aortic stenosis is present.  5. Aortic dilatation noted. There is mild dilatation of the ascending aorta, measuring 40 mm.  6. Technically difficult study Comparison(s): No significant change from prior study. FINDINGS  Left Ventricle: Left ventricular ejection fraction, by estimation, is 60 to 65%. The left  ventricle has normal function. The left ventricle has no regional wall motion abnormalities. Definity contrast agent was given IV to delineate the left ventricular  endocardial borders. The left ventricular internal cavity size was normal in size. There is no left ventricular hypertrophy. Left ventricular diastolic parameters are consistent with Grade I diastolic dysfunction (impaired relaxation). Right Ventricle: The right ventricular size is normal. No increase in right ventricular wall thickness. Right ventricular systolic function is normal. Tricuspid regurgitation signal is inadequate for assessing PA pressure. Left Atrium: Left atrial size was normal in size. Right Atrium: Right atrial size was normal in size. Pericardium: There is no evidence of pericardial effusion. Presence of epicardial fat layer. Mitral Valve: The mitral valve was not well visualized. No evidence of mitral valve regurgitation. Tricuspid Valve: The tricuspid valve is normal in structure. Tricuspid valve regurgitation is not demonstrated. No evidence of tricuspid stenosis. Aortic Valve: The aortic valve was not well visualized. Aortic valve regurgitation is not visualized. No aortic stenosis is present. Aortic valve mean gradient measures 2.0 mmHg. Aortic valve peak gradient measures 4.0 mmHg. Aortic valve area, by VTI measures 4.41 cm. Pulmonic Valve: The pulmonic valve was not well visualized. Pulmonic valve regurgitation is not visualized. Aorta: Aortic dilatation noted. There is mild dilatation of the ascending aorta, measuring 40 mm. Venous: The inferior vena cava was not well visualized. IAS/Shunts: No atrial level shunt detected by color flow Doppler.  LEFT VENTRICLE PLAX 2D LVOT diam:     2.30 cm   Diastology LV SV:         78        LV e' medial:    4.13 cm/s LV SV Index:  38        LV E/e' medial:  13.2 LVOT Area:     4.15 cm  LV e' lateral:   8.16 cm/s                          LV E/e' lateral: 6.7  RIGHT VENTRICLE RV Basal  diam:  3.10 cm RV S prime:     14.80 cm/s TAPSE (M-mode): 1.7 cm LEFT ATRIUM             Index        RIGHT ATRIUM           Index LA Vol (A2C):   30.9 ml 15.19 ml/m  RA Area:     12.90 cm LA Vol (A4C):   38.9 ml 19.13 ml/m  RA Volume:   26.20 ml  12.88 ml/m LA Biplane Vol: 38.2 ml 18.78 ml/m  AORTIC VALVE AV Area (Vmax):    3.42 cm AV Area (Vmean):   3.36 cm AV Area (VTI):     4.41 cm AV Vmax:           100.00 cm/s AV Vmean:          72.200 cm/s AV VTI:            0.177 m AV Peak Grad:      4.0 mmHg AV Mean Grad:      2.0 mmHg LVOT Vmax:         82.20 cm/s LVOT Vmean:        58.400 cm/s LVOT VTI:          0.188 m LVOT/AV VTI ratio: 1.06  AORTA Ao Root diam: 3.60 cm Ao Asc diam:  3.90 cm MITRAL VALVE MV Area (PHT): 3.60 cm    SHUNTS MV Decel Time: 211 msec    Systemic VTI:  0.19 m MV E velocity: 54.40 cm/s  Systemic Diam: 2.30 cm MV A velocity: 90.40 cm/s MV E/A ratio:  0.60 Rudean Haskell MD Electronically signed by Rudean Haskell MD Signature Date/Time: 11/27/2021/1:31:59 PM    Final    CT CHEST ABDOMEN PELVIS W CONTRAST  Addendum Date: 11/26/2021   ADDENDUM REPORT: 11/26/2021 20:14 ADDENDUM: Findings conveyed toFloor nurse Matt on 11/26/2021  at20:02. Electronically Signed   By: Suzy Bouchard M.D.   On: 11/26/2021 20:14   Result Date: 11/26/2021 CLINICAL DATA:  Unintended weight loss. Progressive lower extremity weakness EXAM: CT CHEST, ABDOMEN, AND PELVIS WITH CONTRAST TECHNIQUE: Multidetector CT imaging of the chest, abdomen and pelvis was performed following the standard protocol during bolus administration of intravenous contrast. RADIATION DOSE REDUCTION: This exam was performed according to the departmental dose-optimization program which includes automated exposure control, adjustment of the mA and/or kV according to patient size and/or use of iterative reconstruction technique. CONTRAST:  138mL OMNIPAQUE IOHEXOL 300 MG/ML  SOLN COMPARISON:  07/02/19, MRI cervicothoracic and  lumbar spine 11/25/2021 FINDINGS: CT CHEST FINDINGS Cardiovascular: Coronary artery calcification and aortic atherosclerotic calcification. Mediastinum/Nodes: No axillary or supraclavicular adenopathy. No mediastinal or hilar adenopathy. No pericardial fluid. Esophagus normal. Lungs/Pleura: Filling defect the proximal RIGHT lower lobe pulmonary artery (image 25/3). This thrombosis is partially occlusive. No additional evidence of pulmonary emboli. no evidence of RIGHT ventricular strain. Musculoskeletal: No aggressive osseous lesion. CT ABDOMEN AND PELVIS FINDINGS Hepatobiliary: No focal hepatic lesion. No biliary ductal dilatation. Gallbladder is normal. Common bile duct is normal No focal hepatic lesion. Postcholecystectomy. No biliary dilatation. Pancreas: Pancreas is normal.  No ductal dilatation. No pancreatic inflammation. Spleen: Normal spleen Adrenals/urinary tract: Adrenal glands are normal. Low-density lesion in the RIGHT kidney measuring 24 mm is intermediate density on postcontrast exam. Ureters and bladder normal. Stomach/Bowel: Stomach, small bowel, appendix, and cecum are normal. Moderate volume stool in the rectum. LEFT colon normal Vascular/Lymphatic: Abdominal aorta is normal caliber with atherosclerotic calcification. There is no retroperitoneal or periportal lymphadenopathy. No pelvic lymphadenopathy. Reproductive: Prostate normal.  Penile prosthetic noted Other: No free fluid. Musculoskeletal: No aggressive osseous lesion. IMPRESSION: Chest Impression: 1. Acute pulmonary embolism within the proximal RIGHT lower pulmonary artery. Overall clot burden is moderate. No RIGHT ventricular strain. 2. No evidence of malignancy in the thorax. 3. Coronary artery calcification and Aortic Atherosclerosis (ICD10-I70.0). Abdomen / Pelvis Impression: 1. No evidence of malignancy in the abdomen pelvis. 2. Indeterminate lesion in the RIGHT kidney is favored a benign cyst. This could be confirmed with renal MRI  with contrast. 3. Moderate volume stool in the rectum. Electronically Signed: By: Suzy Bouchard M.D. On: 11/26/2021 19:51   MR BRAIN W WO CONTRAST  Result Date: 11/26/2021 CLINICAL DATA:  New bilateral lower leg weakness EXAM: MRI HEAD WITHOUT AND WITH CONTRAST TECHNIQUE: Multiplanar, multiecho pulse sequences of the brain and surrounding structures were obtained without and with intravenous contrast. CONTRAST:  8.65mL GADAVIST GADOBUTROL 1 MMOL/ML IV SOLN COMPARISON:  Prior CT imaging FINDINGS: Brain: There is no acute infarction or intracranial hemorrhage. There is no intracranial mass, mass effect, or edema. There is no hydrocephalus or extra-axial fluid collection. Prominence of the ventricles and sulci reflects parenchymal volume loss. There is disproportionate ventricular prominence. Patchy and confluent areas of T2 hyperintensity in the supratentorial white matter are nonspecific but may reflect moderate chronic microvascular ischemic changes. No abnormal enhancement. Vascular: Major vessel flow voids at the skull base are preserved. Skull and upper cervical spine: Normal marrow signal is preserved. Sinuses/Orbits: Paranasal sinuses are aerated. Orbits are unremarkable. Other: Sella is unremarkable.  Mastoid air cells are clear. IMPRESSION: No evidence of recent infarction, hemorrhage, or mass. No abnormal enhancement. Moderate chronic microvascular ischemic changes. Chronic disproportionate ventricular prominence likely reflecting central volume loss. Normal pressure hydrocephalus is possible in the appropriate setting. Electronically Signed   By: Macy Mis M.D.   On: 11/26/2021 12:14     LOS: 2 days   Tanda Rockers, AGACNP Student   [facility]   11/28/2021, 9:55 AM

## 2021-11-28 NOTE — TOC Benefit Eligibility Note (Signed)
Patient Product/process development scientist completed.    The patient is currently admitted and upon discharge could be taking Eliquis Starter Pack.  The current 30 day co-pay is, $45.00.   The patient is insured through [employer] Medicare Part D     Roland Earl, CPhT Pharmacy Patient Advocate Specialist [facility] Patient Advocate Team Direct Number: [phone]  Fax: [phone]

## 2021-11-28 NOTE — Discharge Instructions (Signed)
Information on my medicine - ELIQUIS (apixaban)  This medication education was reviewed with me or my healthcare representative as part of my discharge preparation.    Why was Eliquis prescribed for you? Eliquis was prescribed to treat blood clots that may have been found in the veins of your legs (deep vein thrombosis) or in your lungs (pulmonary embolism) and to reduce the risk of them occurring again.  What do You need to know about Eliquis ? The starting dose is 10 mg (two 5 mg tablets) taken TWICE daily for the FIRST SEVEN (7) DAYS, then on 12/05/21  the dose is reduced to ONE 5 mg tablet taken TWICE daily.  Eliquis may be taken with or without food.   Try to take the dose about the same time in the morning and in the evening. If you have difficulty swallowing the tablet whole please discuss with your pharmacist how to take the medication safely.  Take Eliquis exactly as prescribed and DO NOT stop taking Eliquis without talking to the doctor who prescribed the medication.  Stopping may increase your risk of developing a new blood clot.  Refill your prescription before you run out.  After discharge, you should have regular check-up appointments with your healthcare provider that is prescribing your Eliquis.    What do you do if you miss a dose? If a dose of ELIQUIS is not taken at the scheduled time, take it as soon as possible on the same day and twice-daily administration should be resumed. The dose should not be doubled to make up for a missed dose.  Important Safety Information A possible side effect of Eliquis is bleeding. You should call your healthcare provider right away if you experience any of the following: Bleeding from an injury or your nose that does not stop. Unusual colored urine (red or dark brown) or unusual colored stools (red or black). Unusual bruising for unknown reasons. A serious fall or if you hit your head (even if there is no bleeding).  Some  medicines may interact with Eliquis and might increase your risk of bleeding or clotting while on Eliquis. To help avoid this, consult your healthcare provider or pharmacist prior to using any new prescription or non-prescription medications, including herbals, vitamins, non-steroidal anti-inflammatory drugs (NSAIDs) and supplements.  This website has more information on Eliquis (apixaban): http://www.eliquis.com/eliquis/home

## 2021-11-28 NOTE — Progress Notes (Signed)
Physical Therapy Treatment Patient Details Name: William Hunter MRN: [id number] DOB: [birth year] Today's Date: 11/28/2021   History of Present Illness 76 y.o. male presents to [facility] hospital 11/24/2021 with progressive weakness over the last 3-4 months, unable to ambulate. PMH includes OA, CAD, HTN, RA, DMII.    PT Comments    Pt received in supine, agreeable to therapy session with encouragement, with fair tolerance for seated balance tasks. Pt limited due to extreme tachycardia with ~5 mins seated tasks and needing up to +2 maxA for bed mobility and lateral scooting along EOB. Pt HR to 149 bpm with minimal scooting activity so defer slide board transfer OOB for safety, plan to hoyer OOB vs Stedy to chair transfer next session if pt vitals remain more stable. Pt continues to benefit from PT services to progress toward functional mobility goals.    Recommendations for follow up therapy are one component of a multi-disciplinary discharge planning process, led by the attending physician.  Recommendations may be updated based on patient status, additional functional criteria and insurance authorization.  Follow Up Recommendations  Skilled nursing-short term rehab (<3 hours/day)     Assistance Recommended at Discharge Frequent or constant Supervision/Assistance  Patient can return home with the following Two people to help with walking and/or transfers;Two people to help with bathing/dressing/bathroom;Assistance with cooking/housework;Assist for transportation;Help with stairs or ramp for entrance   Equipment Recommendations  Other (comment);Hospital bed (hoyer lift, hospital bed)    Recommendations for Other Services       Precautions / Restrictions Precautions Precautions: Fall Precaution Comments: extreme tachy sitting EOB 6/21 Restrictions Weight Bearing Restrictions: No     Mobility  Bed Mobility Overal bed mobility: Needs Assistance Bed Mobility: Supine to Sit, Sit to Supine      Supine to sit: Max assist, HOB elevated Sit to supine: Max assist, +2 for physical assistance   General bed mobility comments: pt able to manage LE nearly to EoB but requires maxA for bringing trunk to upright with cues for propping up on elbow and UE placement and for pad scoot of hips towards EoB; increased assist to return to supine with trunk/BLE assist due to severe fatigue and tachycardia; Pt able to assist with posterior supine scooting toward HOB with +2 maxA bed in trendelenburg pt grasping overhead rails    Transfers Overall transfer level: Needs assistance   Transfers: Bed to chair/wheelchair/BSC            Lateral/Scoot Transfers: Max assist, +2 physical assistance General transfer comment: defer standing, pt too fatigued to attempt slide board transfer OOB so practiced lateral scooting along EOB, pt needing +2 maxA to scoot and quick to fatigue, HR to nearly 150 with scooting so defer further seated/standing tasks    Ambulation/Gait               General Gait Details: unable   Stairs             Wheelchair Mobility    Modified Rankin (Stroke Patients Only)       Balance Overall balance assessment: Needs assistance Sitting-balance support: Single extremity supported, Bilateral upper extremity supported, Feet unsupported Sitting balance-Leahy Scale: Poor Sitting balance - Comments: mod to maxA with heavy R/posterior lean, pt with weak righting reflexes and difficulty maintaining feet on the floor Postural control: Posterior lean, Right lateral lean     Standing balance comment: pt unable  Cognition Arousal/Alertness: Awake/alert Behavior During Therapy: Flat affect Overall Cognitive Status: Impaired/Different from baseline Area of Impairment: Problem solving, Awareness, Safety/judgement                         Safety/Judgement: Decreased awareness of safety, Decreased awareness of  deficits Awareness: Emergent Problem Solving: Slow processing, Difficulty sequencing, Requires verbal cues, Requires tactile cues, Decreased initiation General Comments: wife reports pt at his baseline, stubborn mainly; pt participatory with encouragement        Exercises Other Exercises Other Exercises: verbal/visual demo for LAQ, hip abduction, heel sides x1-3 reps ea for teachback    General Comments General comments (skin integrity, edema, etc.): HR to 147-149 bpm seated EOB attempting to scoot, remains 130's-140 at rest while pt back to supine to eat (bed in chair posture)      Pertinent Vitals/Pain Pain Assessment Pain Assessment: Faces Faces Pain Scale: Hurts little more Pain Location: B knees Pain Descriptors / Indicators: Discomfort, Grimacing, Aching Pain Intervention(s): Limited activity within patient's tolerance, Monitored during session, Repositioned    Home Living                          Prior Function            PT Goals (current goals can now be found in the care plan section) Acute Rehab PT Goals Patient Stated Goal: to regain strength and mobility PT Goal Formulation: With patient Time For Goal Achievement: 12/09/21 Progress towards PT goals: Progressing toward goals    Frequency    Min 3X/week (pending SNF approval)      PT Plan Discharge plan needs to be updated;Equipment recommendations need to be updated       AM-PAC PT "6 Clicks" Mobility   Outcome Measure  Help needed turning from your back to your side while in a flat bed without using bedrails?: A Lot Help needed moving from lying on your back to sitting on the side of a flat bed without using bedrails?: Total Help needed moving to and from a bed to a chair (including a wheelchair)?: Total Help needed standing up from a chair using your arms (e.g., wheelchair or bedside chair)?: Total Help needed to walk in hospital room?: Total Help needed climbing 3-5 steps with a  railing? : Total 6 Click Score: 7    End of Session   Activity Tolerance: Patient limited by fatigue;Treatment limited secondary to medical complications (Comment) (extreme tachycardia limiting mobility progression) Patient left: in bed;with call bell/phone within reach;with bed alarm set;Other (comment) (bed in chair posture, pt set up to eat dinner after tray arrived, heels floated) Nurse Communication: Mobility status;Need for lift equipment (recommend hoyer for OOB to chair, or Stedy depending on participation/HR in next therapy session) PT Visit Diagnosis: Other abnormalities of gait and mobility (R26.89);Muscle weakness (generalized) (M62.81);Other symptoms and signs involving the nervous system (R29.898)     Time: 4098-1191 PT Time Calculation (min) (ACUTE ONLY): 23 min  Charges:  $Therapeutic Activity: 23-37 mins                     William Hartlage P., PTA Acute Rehabilitation Services Secure Chat Preferred 9a-5:30pm Office: [phone]    Dorathy Kinsman [facility] 11/28/2021, 6:04 PM

## 2021-11-29 DIAGNOSIS — I5030 Unspecified diastolic (congestive) heart failure: Secondary | ICD-10-CM | POA: Diagnosis not present

## 2021-11-29 DIAGNOSIS — I82409 Acute embolism and thrombosis of unspecified deep veins of unspecified lower extremity: Secondary | ICD-10-CM | POA: Diagnosis not present

## 2021-11-29 DIAGNOSIS — E785 Hyperlipidemia, unspecified: Secondary | ICD-10-CM | POA: Diagnosis not present

## 2021-11-29 DIAGNOSIS — R29898 Other symptoms and signs involving the musculoskeletal system: Secondary | ICD-10-CM | POA: Diagnosis not present

## 2021-11-29 DIAGNOSIS — K5900 Constipation, unspecified: Secondary | ICD-10-CM | POA: Diagnosis not present

## 2021-11-29 DIAGNOSIS — E1142 Type 2 diabetes mellitus with diabetic polyneuropathy: Secondary | ICD-10-CM

## 2021-11-29 DIAGNOSIS — E118 Type 2 diabetes mellitus with unspecified complications: Secondary | ICD-10-CM | POA: Diagnosis not present

## 2021-11-29 DIAGNOSIS — I201 Angina pectoris with documented spasm: Secondary | ICD-10-CM | POA: Diagnosis not present

## 2021-11-29 DIAGNOSIS — R110 Nausea: Secondary | ICD-10-CM | POA: Diagnosis not present

## 2021-11-29 DIAGNOSIS — R531 Weakness: Secondary | ICD-10-CM | POA: Diagnosis not present

## 2021-11-29 DIAGNOSIS — I2699 Other pulmonary embolism without acute cor pulmonale: Secondary | ICD-10-CM | POA: Diagnosis not present

## 2021-11-29 DIAGNOSIS — R799 Abnormal finding of blood chemistry, unspecified: Secondary | ICD-10-CM | POA: Diagnosis not present

## 2021-11-29 DIAGNOSIS — M6281 Muscle weakness (generalized): Secondary | ICD-10-CM | POA: Diagnosis not present

## 2021-11-29 DIAGNOSIS — H1032 Unspecified acute conjunctivitis, left eye: Secondary | ICD-10-CM | POA: Diagnosis not present

## 2021-11-29 DIAGNOSIS — Z7401 Bed confinement status: Secondary | ICD-10-CM | POA: Diagnosis not present

## 2021-11-29 DIAGNOSIS — N289 Disorder of kidney and ureter, unspecified: Secondary | ICD-10-CM | POA: Diagnosis not present

## 2021-11-29 DIAGNOSIS — I5022 Chronic systolic (congestive) heart failure: Secondary | ICD-10-CM | POA: Diagnosis not present

## 2021-11-29 DIAGNOSIS — M170 Bilateral primary osteoarthritis of knee: Secondary | ICD-10-CM | POA: Diagnosis not present

## 2021-11-29 DIAGNOSIS — I82402 Acute embolism and thrombosis of unspecified deep veins of left lower extremity: Secondary | ICD-10-CM | POA: Diagnosis not present

## 2021-11-29 DIAGNOSIS — M6259 Muscle wasting and atrophy, not elsewhere classified, multiple sites: Secondary | ICD-10-CM | POA: Diagnosis not present

## 2021-11-29 DIAGNOSIS — M069 Rheumatoid arthritis, unspecified: Secondary | ICD-10-CM | POA: Diagnosis not present

## 2021-11-29 DIAGNOSIS — R300 Dysuria: Secondary | ICD-10-CM | POA: Diagnosis not present

## 2021-11-29 DIAGNOSIS — N390 Urinary tract infection, site not specified: Secondary | ICD-10-CM | POA: Diagnosis not present

## 2021-11-29 DIAGNOSIS — M1990 Unspecified osteoarthritis, unspecified site: Secondary | ICD-10-CM | POA: Diagnosis not present

## 2021-11-29 DIAGNOSIS — F5102 Adjustment insomnia: Secondary | ICD-10-CM | POA: Diagnosis not present

## 2021-11-29 DIAGNOSIS — E7800 Pure hypercholesterolemia, unspecified: Secondary | ICD-10-CM | POA: Diagnosis not present

## 2021-11-29 DIAGNOSIS — F4323 Adjustment disorder with mixed anxiety and depressed mood: Secondary | ICD-10-CM | POA: Diagnosis not present

## 2021-11-29 DIAGNOSIS — L89621 Pressure ulcer of left heel, stage 1: Secondary | ICD-10-CM | POA: Diagnosis not present

## 2021-11-29 DIAGNOSIS — I700 Atherosclerosis of aorta: Secondary | ICD-10-CM | POA: Diagnosis not present

## 2021-11-29 DIAGNOSIS — R269 Unspecified abnormalities of gait and mobility: Secondary | ICD-10-CM | POA: Diagnosis not present

## 2021-11-29 DIAGNOSIS — E119 Type 2 diabetes mellitus without complications: Secondary | ICD-10-CM | POA: Diagnosis not present

## 2021-11-29 DIAGNOSIS — I10 Essential (primary) hypertension: Secondary | ICD-10-CM | POA: Diagnosis not present

## 2021-11-29 DIAGNOSIS — M0609 Rheumatoid arthritis without rheumatoid factor, multiple sites: Secondary | ICD-10-CM | POA: Diagnosis not present

## 2021-11-29 DIAGNOSIS — L89620 Pressure ulcer of left heel, unstageable: Secondary | ICD-10-CM | POA: Diagnosis not present

## 2021-11-29 DIAGNOSIS — L97419 Non-pressure chronic ulcer of right heel and midfoot with unspecified severity: Secondary | ICD-10-CM | POA: Diagnosis not present

## 2021-11-29 DIAGNOSIS — Z79899 Other long term (current) drug therapy: Secondary | ICD-10-CM | POA: Diagnosis not present

## 2021-11-29 DIAGNOSIS — Z86711 Personal history of pulmonary embolism: Secondary | ICD-10-CM | POA: Diagnosis not present

## 2021-11-29 LAB — MULTIPLE MYELOMA PANEL, SERUM
Albumin SerPl Elph-Mcnc: 3.1 g/dL (ref 2.9–4.4)
Albumin/Glob SerPl: 1.1 (ref 0.7–1.7)
Alpha 1: 0.2 g/dL (ref 0.0–0.4)
Alpha2 Glob SerPl Elph-Mcnc: 1 g/dL (ref 0.4–1.0)
B-Globulin SerPl Elph-Mcnc: 1.1 g/dL (ref 0.7–1.3)
Gamma Glob SerPl Elph-Mcnc: 0.8 g/dL (ref 0.4–1.8)
Globulin, Total: 3.1 g/dL (ref 2.2–3.9)
IgA: 418 mg/dL (ref 61–437)
IgG (Immunoglobin G), Serum: 830 mg/dL (ref 603–1613)
IgM (Immunoglobulin M), Srm: 56 mg/dL (ref 15–143)
Total Protein ELP: 6.2 g/dL (ref 6.0–8.5)

## 2021-11-29 LAB — GLUCOSE, CAPILLARY
Glucose-Capillary: 273 mg/dL — ABNORMAL HIGH (ref 70–99)
Glucose-Capillary: 294 mg/dL — ABNORMAL HIGH (ref 70–99)

## 2021-11-29 LAB — VITAMIN B6

## 2021-11-29 MED ORDER — POLYETHYLENE GLYCOL 3350 17 G PO PACK: 17.0000 g | PACK | Freq: Every day | ORAL | 0 refills | Status: DC

## 2021-11-29 MED ORDER — ONDANSETRON HCL 4 MG PO TABS: 4.0000 mg | ORAL_TABLET | Freq: Every day | ORAL | 1 refills | Status: DC | PRN

## 2021-11-29 MED ORDER — INSULIN ASPART 100 UNIT/ML IJ SOLN: INTRAMUSCULAR | 11 refills | Status: AC

## 2021-11-29 MED ORDER — NYSTATIN 100000 UNIT/GM EX POWD: Freq: Two times a day (BID) | CUTANEOUS | 0 refills | Status: DC

## 2021-11-29 MED ORDER — SENNOSIDES-DOCUSATE SODIUM 8.6-50 MG PO TABS: 1.0000 | ORAL_TABLET | Freq: Every day | ORAL | Status: DC

## 2021-11-29 MED ORDER — INSULIN GLARGINE-YFGN 100 UNIT/ML ~~LOC~~ SOLN: 40.0000 [IU] | Freq: Every day | SUBCUTANEOUS | 11 refills | Status: DC

## 2021-11-29 MED ORDER — APIXABAN 5 MG PO TABS: ORAL_TABLET | ORAL | Status: DC

## 2021-11-29 NOTE — TOC Transition Note (Signed)
Transition of Care [facility]) - CM/SW Discharge Note   Patient Details  Name: William Hunter MRN: [id number] Date of Birth: [birth year]  Transition of Care [facility] CM/SW Contact:  Mearl Latin, LCSW Phone Number: 11/29/2021, 12:44 PM   Clinical Narrative:    Patient will DC to: [facility] Anticipated DC date: 11/29/21 Family notified: Spouse Transport by: Sharin Mons   Per MD patient ready for DC to [facility]. RN to call report prior to discharge [phone]). RN, patient, patient's family, and facility notified of DC. Discharge Summary and FL2 sent to facility. DC packet on chart. Ambulance transport requested for patient.   CSW will sign off for now as social work intervention is no longer needed. Please consult [place] again if new needs arise.     Final next level of care: Skilled Nursing Facility Barriers to Discharge: Barriers Resolved   Patient Goals and CMS Choice Patient states their goals for this hospitalization and ongoing recovery are:: Rehab CMS Medicare.gov Compare Post Acute Care list provided to:: Patient Choice offered to / list presented to : Patient  Discharge Placement   Existing PASRR number confirmed : 11/29/21          Patient chooses bed at: [facility] Patient to be transferred to facility by: [facility] Name of family member notified: Spouse Patient and family notified of of transfer: 11/29/21  Discharge Plan and Services In-house Referral: Clinical Social Work   Post Acute Care Choice: Skilled Nursing Facility                               Social Determinants of Health (SDOH) Interventions     Readmission Risk Interventions     No data to display

## 2021-11-29 NOTE — TOC Progression Note (Signed)
Transition of Care [facility]) - Progression Note    Patient Details  Name: William Hunter MRN: [id number] Date of Birth: 07/21/1945  Transition of Care [facility]) CM/SW Contact  Mearl Latin, LCSW Phone Number: 11/29/2021, 9:09 AM  Clinical Narrative:    Patient's spouse returned call and stated she would be at [facility] at 10am.    Expected Discharge Plan: Skilled Nursing Facility Barriers to Discharge: Continued Medical Work up, English as a second language teacher, SNF Pending bed offer  Expected Discharge Plan and Services Expected Discharge Plan: Skilled Nursing Facility In-house Referral: Clinical Social Work   Post Acute Care Choice: Skilled Nursing Facility Living arrangements for the past 2 months: Single Family Home                                       Social Determinants of Health (SDOH) Interventions    Readmission Risk Interventions     No data to display

## 2021-11-29 NOTE — Progress Notes (Signed)
Discharge paperwork reviewed with client at this time. IV has been removed. No complaints of pain have been made at this time. Personal belongings have been returned to patient. No further requests have been made.

## 2021-11-29 NOTE — Progress Notes (Signed)
Occupational Therapy Treatment Patient Details Name: William Hunter MRN: [id number] DOB: 04-11-1946 Today's Date: 11/29/2021   History of present illness 76 y.o. male presents to [facility] hospital 11/24/2021 with progressive weakness over the last 3-4 months, unable to ambulate. PMH includes OA, CAD, HTN, RA, DMII.   OT comments  Pt assisted to EOB and supported in sitting x 10 minutes for grooming and self feeding. Fatigues easily. Pt laterally scooted at EOB to reposition prior to return to supine with max assist. Updated d/c recommendation to SNF as pt does not meet criteria for AIR.    Recommendations for follow up therapy are one component of a multi-disciplinary discharge planning process, led by the attending physician.  Recommendations may be updated based on patient status, additional functional criteria and insurance authorization.    Follow Up Recommendations  Skilled nursing-short term rehab (<3 hours/day)    Assistance Recommended at Discharge Frequent or constant Supervision/Assistance  Patient can return home with the following  Two people to help with walking and/or transfers;A lot of help with bathing/dressing/bathroom;Assistance with cooking/housework;Assist for transportation;Help with stairs or ramp for entrance   Equipment Recommendations  BSC/3in1;Wheelchair (measurements OT);Wheelchair cushion (measurements OT)    Recommendations for Other Services      Precautions / Restrictions Precautions Precautions: Fall Precaution Comments: watch HR       Mobility Bed Mobility Overal bed mobility: Needs Assistance Bed Mobility: Supine to Sit, Sit to Supine     Supine to sit: Max assist Sit to supine: Mod assist   General bed mobility comments: max assist to raise trunk, assist for LEs back into bed    Transfers Overall transfer level: Needs assistance                Lateral/Scoot Transfers: Max assist General transfer comment: max assist with use of bed pad to  slide up in bed seated at EOB     Balance Overall balance assessment: Needs assistance   Sitting balance-Leahy Scale: Poor Sitting balance - Comments: posterior lean requiring moderate assistance and one hand on bed                                   ADL either performed or assessed with clinical judgement   ADL Overall ADL's : Needs assistance/impaired Eating/Feeding: Independent;Sitting   Grooming: Wash/dry hands;Wash/dry face;Sitting;Set up                                      Extremity/Trunk Assessment              Vision       Perception     Praxis      Cognition Arousal/Alertness: Awake/alert Behavior During Therapy: Flat affect Overall Cognitive Status: History of cognitive impairments - at baseline                                          Exercises      Shoulder Instructions       General Comments      Pertinent Vitals/ Pain       Pain Assessment Pain Assessment: No/denies pain  Home Living  Prior Functioning/Environment              Frequency  Min 2X/week        Progress Toward Goals  OT Goals(current goals can now be found in the care plan section)  Progress towards OT goals: Progressing toward goals  Acute Rehab OT Goals OT Goal Formulation: With patient Time For Goal Achievement: 12/10/21 Potential to Achieve Goals: Good  Plan Discharge plan needs to be updated    Co-evaluation                 AM-PAC OT "6 Clicks" Daily Activity     Outcome Measure   Help from another person eating meals?: None Help from another person taking care of personal grooming?: A Little Help from another person toileting, which includes using toliet, bedpan, or urinal?: A Lot Help from another person bathing (including washing, rinsing, drying)?: A Lot Help from another person to put on and taking off regular upper body clothing?:  A Lot Help from another person to put on and taking off regular lower body clothing?: Total 6 Click Score: 14    End of Session    OT Visit Diagnosis: Unsteadiness on feet (R26.81);Other abnormalities of gait and mobility (R26.89);Muscle weakness (generalized) (M62.81)   Activity Tolerance Patient tolerated treatment well   Patient Left in bed;with call bell/phone within reach;with bed alarm set   Nurse Communication          Time: [phone] OT Time Calculation (min): 15 min  Charges: OT General Charges $OT Visit: 1 Visit OT Treatments $Self Care/Home Management : 8-22 mins  Berna Spare, OTR/L Acute Rehabilitation Services Office: [phone]   Evern Bio 11/29/2021, 9:30 AM

## 2021-11-29 NOTE — Plan of Care (Signed)

## 2021-11-29 NOTE — Discharge Summary (Signed)
PATIENT DETAILS Name: William Hunter Age: 76 y.o. Sex: male Date of Birth: Apr 26, 1946 MRN: [id number]. Admitting Physician: William Mura, DO William Hunter, William Dyer, DO  Admit Date: 11/24/2021 Discharge date: 11/29/2021  Recommendations for Outpatient Follow-up:  Follow up with PCP in 1-2 weeks Please obtain CMP/CBC in one week Please ensure follow up with neurology. Needs outpatient nerve conduction study/EMG Vitamin B6 levels pending-please follow Multiple myeloma panel pending-please follow-up Indeterminate right kidney lesion (favored to be benign cyst) radiology recommending outpatient MRI.  Admitted From:  Home  Disposition: Skilled nursing facility   Discharge Condition: good  CODE STATUS:   Code Status: Full Code   Diet recommendation:  Diet Order             Diet - low sodium heart healthy           Diet Carb Modified           Diet Carb Modified Fluid consistency: Thin; Room service appropriate? No  Diet effective now                    Brief Summary: Patient is a 76 year old male with history of RA, OA of bilateral knees, DM-2, HTN, HLD, chronic HFpEF-who presented with worsening lower extremity weakness.  He was subsequently admitted to the hospitalist service.  Brief Hospital Course: Progressive bilateral lower extremity weakness: Ongoing for past 2 years-but recently more progressive.  Neuroimaging negative for any structural abnormalities.  CT chest/abdomen/pelvis did not show any malignancy as well.  Neurology recommending outpatient EMG/NCS-plans are to discharge to SNF with close outpatient neurology follow-up.  Pulmonary embolism with left lower extremity DVT: Incidental finding on CT imaging-subsequently found to have LLE DVT without any major swelling of his extremities-was maintained on IV heparin-and subsequently transition to Eliquis on 6/21.     DM-2 (A1c 9.7 on 6/18): CBGs on the higher side-increase Semglee to 40 units daily-continue  SSI.  Attending MD at SNF to optimize further.   Rheumatoid arthritis: Resume Humira postdischarge-continue methotrexate.  Indeterminate right kidney lesion seen on CT abdomen: Radiology suspect this is a benign cyst-suggest outpatient MRI at discretion of PCP to ensure this is not something serious.   BMI Estimated body mass index is 24.95 kg/m as calculated from the following:   Height as of this encounter: 5' 11.5" (1.816 m).   Weight as of this encounter: 82.3 kg.   Discharge Diagnoses:  Principal Problem:   Leg weakness, bilateral Active Problems:   Bilateral leg weakness   Lower extremity weakness   Discharge Instructions:  Activity:  As tolerated with Full fall precautions use walker/cane & assistance as needed  Discharge Instructions     Ambulatory referral to Neurology   Complete by: As directed    An appointment is requested in approximately: 4 wks   Call MD for:  difficulty breathing, headache or visual disturbances   Complete by: As directed    Call MD for:  extreme fatigue   Complete by: As directed    Diet - low sodium heart healthy   Complete by: As directed    Diet Carb Modified   Complete by: As directed    Discharge instructions   Complete by: As directed    Follow with Primary MD  William Morning, DO in 1-2 weeks  Neurology office will give you a call for further work-up/follow-up appointment.  Please get a complete blood count and chemistry panel checked by your Primary MD at your next visit, and  again as instructed by your Primary MD.  Get Medicines reviewed and adjusted: Please take all your medications with you for your next visit with your Primary MD  Laboratory/radiological data: Please request your Primary MD to go over all hospital tests and procedure/radiological results at the follow up, please ask your Primary MD to get all Hospital records sent to his/her office.  In some cases, they will be blood work, cultures and biopsy results  pending at the time of your discharge. Please request that your primary care M.D. follows up on these results.  Also Note the following: If you experience worsening of your admission symptoms, develop shortness of breath, life threatening emergency, suicidal or homicidal thoughts you must seek medical attention immediately by calling 911 or calling your MD immediately  if symptoms less severe.  You must read complete instructions/literature along with all the possible adverse reactions/side effects for all the Medicines you take and that have been prescribed to you. Take any new Medicines after you have completely understood and accpet all the possible adverse reactions/side effects.   Do not drive when taking Pain medications or sleeping medications (Benzodaizepines)  Do not take more than prescribed Pain, Sleep and Anxiety Medications. It is not advisable to combine anxiety,sleep and pain medications without talking with your primary care practitioner  Special Instructions: If you have smoked or chewed Tobacco  in the last 2 yrs please stop smoking, stop any regular Alcohol  and or any Recreational drug use.  Wear Seat belts while driving.  Please note: You were cared for by a hospitalist during your hospital stay. Once you are discharged, your primary care physician will handle any further medical issues. Please note that NO REFILLS for any discharge medications will be authorized once you are discharged, as it is imperative that you return to your primary care physician (or establish a relationship with a primary care physician if you do not have one) for your post hospital discharge needs so that they can reassess your need for medications and monitor your lab values.   Check CBG before meals and at bedtime   Increase activity slowly   Complete by: As directed       Allergies as of 11/29/2021       Reactions   Codeine Swelling   Penicillins Hives, Swelling   Has patient had a PCN  reaction causing immediate rash, facial/tongue/throat swelling, SOB or lightheadedness with hypotension: Yes Has patient had a PCN reaction causing severe rash involving mucus membranes or skin necrosis: No Has patient had a PCN reaction that required hospitalization No Has patient had a PCN reaction occurring within the last 10 years: No If all of the above answers are "NO", then may proceed with Cephalosporin use.   Prednisone Hives   Wool Alcohol [lanolin] Hives   Empagliflozin Nausea And Vomiting        Medication List     STOP taking these medications    Basaglar KwikPen 100 UNIT/ML   insulin lispro 100 UNIT/ML KwikPen Commonly known as: HUMALOG       TAKE these medications    apixaban 5 MG Tabs tablet Commonly known as: ELIQUIS Take 10 mg p.o. twice daily, on  Wed 12/05/21 at 1000-switch to 5 mg p.o. twice daily.   diltiazem 300 MG 24 hr capsule Commonly known as: TIAZAC Take 1 capsule (300 mg total) by mouth daily. Pt. Needs to schedule overdue appt. With Dr. Marlou Porch in order to receive future refills. Thank You. 2nd  Attempt.   folic acid 1 MG tablet Commonly known as: FOLVITE Take 1 mg by mouth daily.   Humira Pen 40 MG/0.4ML Pnkt Generic drug: Adalimumab Inject 0.4 mLs into the muscle every 14 (fourteen) days.   insulin aspart 100 UNIT/ML injection Commonly known as: novoLOG 0-5 Units, Subcutaneous, Daily at bedtime: HS scale CBG < 70: implement hypoglycemia measures CBG 70 - 120: 0 units CBG 121 - 150: 0 units CBG 151 - 200: 0 units CBG 201 - 250: 2 units CBG 251 - 300: 3 units CBG 301 - 350: 4 units CBG 351 - 400: 5 units CBG > 400: call MD and obtain STAT lab verification What changed:  how much to take how to take this when to take this additional instructions   insulin aspart 100 UNIT/ML injection Commonly known as: novoLOG 0-15 Units, Subcutaneous, 3 times daily with meals CBG < 70: Implement Hypoglycemia Standing Orders and refer to Hypoglycemia  Standing Orders sidebar report CBG 70 - 120: 0 units CBG 121 - 150: 2 units CBG 151 - 200: 3 units CBG 201 - 250: 5 units CBG 251 - 300: 8 units CBG 301 - 350: 11 units CBG 351 - 400: 15 units CBG > 400: call MD and obtain STAT lab verification What changed: You were already taking a medication with the same name, and this prescription was added. Make sure you understand how and when to take each.   insulin glargine-yfgn 100 UNIT/ML injection Commonly known as: SEMGLEE Inject 0.4 mLs (40 Units total) into the skin at bedtime.   loratadine 10 MG tablet Commonly known as: CLARITIN Take 10 mg by mouth daily as needed for allergies.   methotrexate 2.5 MG tablet Commonly known as: RHEUMATREX Take 15 mg by mouth every Wednesday. Caution:Chemotherapy. Protect from light.   nystatin powder Commonly known as: MYCOSTATIN/NYSTOP Apply topically 2 (two) times daily. Apply to affected area.   ondansetron 4 MG tablet Commonly known as: Zofran Take 1 tablet (4 mg total) by mouth daily as needed for nausea or vomiting.   ORION 4 inclisiran or placebo 300 mg/1.5 mL SQ injection Inject 1.5 mLs (300 mg total) into the skin every 6 (six) months.   polyethylene glycol 17 g packet Commonly known as: MIRALAX / GLYCOLAX Take 17 g by mouth daily.   senna-docusate 8.6-50 MG tablet Commonly known as: Senokot-S Take 1 tablet by mouth at bedtime.        Contact information for follow-up providers     William Morning, DO. Schedule an appointment as soon as possible for a visit in 1 week(s).   Specialty: Family Medicine Contact information: [address] [place] [address] [phone]         Jerline Pain, MD .   Specialty: Cardiology Contact information: [phone]  [address] [phone]              Contact information for after-discharge care     Destination     [facility] .   Service:  Skilled Nursing Contact information: [place] [address] [phone]                    Allergies  Allergen Reactions   Codeine Swelling   Penicillins Hives and Swelling    Has patient had a PCN reaction causing immediate rash, facial/tongue/throat swelling, SOB or lightheadedness with hypotension: Yes Has patient had a PCN reaction causing severe  rash involving mucus membranes or skin necrosis: No Has patient had a PCN reaction that required hospitalization No Has patient had a PCN reaction occurring within the last 10 years: No If all of the above answers are "NO", then may proceed with Cephalosporin use.    Prednisone Hives   Wool Alcohol [Lanolin] Hives   Empagliflozin Nausea And Vomiting     Other Procedures/Studies: VAS [place] LOWER EXTREMITY VENOUS (DVT)  Result Date: 11/27/2021  Lower Venous DVT Study Patient Name:  William Hunter  Date of Exam:   11/27/2021 Medical Rec #: [id number]      Accession #:    [id number] Date of Birth: Feb 02, 1946      Patient Gender: M Patient Age:   33 years Exam [facility] Procedure:      VAS [place] LOWER EXTREMITY VENOUS (DVT) Referring Phys: Terrilee Croak --------------------------------------------------------------------------------  Indications: Pulmonary embolism, and history of left DVT.  Anticoagulation: Heparin. Comparison Study: 07-03-2019 Prior left lower extremity venous was positive for                   acute DVT involving the profunda femoral vein. Performing Technologist: Darlin Coco RDMS, RVT  Examination Guidelines: A complete evaluation includes B-mode imaging, spectral Doppler, color Doppler, and power Doppler as needed of all accessible portions of each vessel. Bilateral testing is considered an integral part of a complete examination. Limited examinations for reoccurring indications may be performed as noted. The reflux portion of the exam is performed with the patient in reverse  Trendelenburg.  +---------+---------------+---------+-----------+----------+--------------+ RIGHT    CompressibilityPhasicitySpontaneityPropertiesThrombus Aging +---------+---------------+---------+-----------+----------+--------------+ CFV      Full           Yes      Yes                                 +---------+---------------+---------+-----------+----------+--------------+ SFJ      Full                                                        +---------+---------------+---------+-----------+----------+--------------+ FV Prox  Full                                                        +---------+---------------+---------+-----------+----------+--------------+ FV Mid   Full                                                        +---------+---------------+---------+-----------+----------+--------------+ FV DistalFull                                                        +---------+---------------+---------+-----------+----------+--------------+ PFV      Full                                                        +---------+---------------+---------+-----------+----------+--------------+  POP      Full           Yes      Yes                                 +---------+---------------+---------+-----------+----------+--------------+ PTV      Full                                                        +---------+---------------+---------+-----------+----------+--------------+ PERO     Full                                                        +---------+---------------+---------+-----------+----------+--------------+   +---------+---------------+---------+-----------+----------+-----------------+ LEFT     CompressibilityPhasicitySpontaneityPropertiesThrombus Aging    +---------+---------------+---------+-----------+----------+-----------------+ CFV      Partial        Yes      Yes                  Acute              +---------+---------------+---------+-----------+----------+-----------------+ SFJ      Partial        Yes      Yes                  Acute             +---------+---------------+---------+-----------+----------+-----------------+ FV Prox  None           No       No                   Acute             +---------+---------------+---------+-----------+----------+-----------------+ FV Mid   None           No       No                   Age Indeterminate +---------+---------------+---------+-----------+----------+-----------------+ FV DistalNone           No       No                   Age Indeterminate +---------+---------------+---------+-----------+----------+-----------------+ PFV      Full                                                           +---------+---------------+---------+-----------+----------+-----------------+ POP      None           No       No                   Age Indeterminate +---------+---------------+---------+-----------+----------+-----------------+ PTV      Full                                                           +---------+---------------+---------+-----------+----------+-----------------+  PERO     None           No       No                   Acute             +---------+---------------+---------+-----------+----------+-----------------+ Gastroc  Full                                                           +---------+---------------+---------+-----------+----------+-----------------+ EIV                     Yes      Yes                                    +---------+---------------+---------+-----------+----------+-----------------+     Summary: RIGHT: - There is no evidence of deep vein thrombosis in the lower extremity.  - No cystic structure found in the popliteal fossa.  LEFT: - Findings consistent with acute deep vein thrombosis involving the left common femoral vein, SF junction, left femoral vein, and left  peroneal veins.  - Findings consistent with age indeterminate deep vein thrombosis involving the left popliteal vein.  - No cystic structure found in the popliteal fossa.  - The common femoral vein obstruction does not appear to extend above inguinal ligament.  *See table(s) above for measurements and observations. Electronically signed by Deitra Mayo MD on 11/27/2021 at 4:46:40 PM.    Final    ECHOCARDIOGRAM COMPLETE  Result Date: 11/27/2021    ECHOCARDIOGRAM REPORT   Patient Name:   William Hunter Date of Exam: 11/27/2021 Medical Rec #:  [id number]     Height:       71.5 in Accession #:    [id number]    Weight:       181.4 lb Date of Birth:  March 07, 1946     BSA:          2.034 m Patient Age:    50 years      BP:           141/70 mmHg Patient Gender: M             HR:           89 bpm. Exam Location:  Inpatient Procedure: 2D Echo, Cardiac Doppler, Color Doppler and Intracardiac            Opacification Agent Indications:    Pulmonary embolus  History:        Patient has prior history of Echocardiogram examinations, most                 recent 02/22/2020. Previous Myocardial Infarction and CAD; Risk                 Factors:Diabetes and Hypertension.  Sonographer:    Clayton Lefort RDCS (AE) Referring Phys: [id number] William Hunter  Sonographer Comments: No subcostal window, Technically challenging study due to limited acoustic windows, Technically difficult study due to poor echo windows, suboptimal parasternal window and suboptimal apical window. IMPRESSIONS  1. Left ventricular ejection fraction, by estimation, is 60 to 65%. The left ventricle has normal function. The left ventricle has no regional wall motion  abnormalities. Left ventricular diastolic parameters are consistent with Grade I diastolic dysfunction (impaired relaxation).  2. Right ventricular systolic function is normal. The right ventricular size is normal. Tricuspid regurgitation signal is inadequate for assessing PA pressure.  3. The mitral  valve was not well visualized. No evidence of mitral valve regurgitation.  4. The aortic valve was not well visualized. Aortic valve regurgitation is not visualized. No aortic stenosis is present.  5. Aortic dilatation noted. There is mild dilatation of the ascending aorta, measuring 40 mm.  6. Technically difficult study Comparison(s): No significant change from prior study. FINDINGS  Left Ventricle: Left ventricular ejection fraction, by estimation, is 60 to 65%. The left ventricle has normal function. The left ventricle has no regional wall motion abnormalities. Definity contrast agent was given IV to delineate the left ventricular  endocardial borders. The left ventricular internal cavity size was normal in size. There is no left ventricular hypertrophy. Left ventricular diastolic parameters are consistent with Grade I diastolic dysfunction (impaired relaxation). Right Ventricle: The right ventricular size is normal. No increase in right ventricular wall thickness. Right ventricular systolic function is normal. Tricuspid regurgitation signal is inadequate for assessing PA pressure. Left Atrium: Left atrial size was normal in size. Right Atrium: Right atrial size was normal in size. Pericardium: There is no evidence of pericardial effusion. Presence of epicardial fat layer. Mitral Valve: The mitral valve was not well visualized. No evidence of mitral valve regurgitation. Tricuspid Valve: The tricuspid valve is normal in structure. Tricuspid valve regurgitation is not demonstrated. No evidence of tricuspid stenosis. Aortic Valve: The aortic valve was not well visualized. Aortic valve regurgitation is not visualized. No aortic stenosis is present. Aortic valve mean gradient measures 2.0 mmHg. Aortic valve peak gradient measures 4.0 mmHg. Aortic valve area, by VTI measures 4.41 cm. Pulmonic Valve: The pulmonic valve was not well visualized. Pulmonic valve regurgitation is not visualized. Aorta: Aortic dilatation  noted. There is mild dilatation of the ascending aorta, measuring 40 mm. Venous: The inferior vena cava was not well visualized. IAS/Shunts: No atrial level shunt detected by color flow Doppler.  LEFT VENTRICLE PLAX 2D LVOT diam:     2.30 cm   Diastology LV SV:         78        LV e' medial:    4.13 cm/s LV SV Index:   38        LV E/e' medial:  13.2 LVOT Area:     4.15 cm  LV e' lateral:   8.16 cm/s                          LV E/e' lateral: 6.7  RIGHT VENTRICLE RV Basal diam:  3.10 cm RV S prime:     14.80 cm/s TAPSE (M-mode): 1.7 cm LEFT ATRIUM             Index        RIGHT ATRIUM           Index LA Vol (A2C):   30.9 ml 15.19 ml/m  RA Area:     12.90 cm LA Vol (A4C):   38.9 ml 19.13 ml/m  RA Volume:   26.20 ml  12.88 ml/m LA Biplane Vol: 38.2 ml 18.78 ml/m  AORTIC VALVE AV Area (Vmax):    3.42 cm AV Area (Vmean):   3.36 cm AV Area (VTI):     4.41 cm AV Vmax:  100.00 cm/s AV Vmean:          72.200 cm/s AV VTI:            0.177 m AV Peak Grad:      4.0 mmHg AV Mean Grad:      2.0 mmHg LVOT Vmax:         82.20 cm/s LVOT Vmean:        58.400 cm/s LVOT VTI:          0.188 m LVOT/AV VTI ratio: 1.06  AORTA Ao Root diam: 3.60 cm Ao Asc diam:  3.90 cm MITRAL VALVE MV Area (PHT): 3.60 cm    SHUNTS MV Decel Time: 211 msec    Systemic VTI:  0.19 m MV E velocity: 54.40 cm/s  Systemic Diam: 2.30 cm MV A velocity: 90.40 cm/s MV E/A ratio:  0.60 Rudean Haskell MD Electronically signed by Rudean Haskell MD Signature Date/Time: 11/27/2021/1:31:59 PM    Final    CT CHEST ABDOMEN PELVIS W CONTRAST  Addendum Date: 11/26/2021   ADDENDUM REPORT: 11/26/2021 20:14 ADDENDUM: Findings conveyed toFloor nurse Matt on 11/26/2021  at20:02. Electronically Signed   By: Suzy Bouchard M.D.   On: 11/26/2021 20:14   Result Date: 11/26/2021 CLINICAL DATA:  Unintended weight loss. Progressive lower extremity weakness EXAM: CT CHEST, ABDOMEN, AND PELVIS WITH CONTRAST TECHNIQUE: Multidetector CT imaging of the  chest, abdomen and pelvis was performed following the standard protocol during bolus administration of intravenous contrast. RADIATION DOSE REDUCTION: This exam was performed according to the departmental dose-optimization program which includes automated exposure control, adjustment of the mA and/or kV according to patient size and/or use of iterative reconstruction technique. CONTRAST:  168m OMNIPAQUE IOHEXOL 300 MG/ML  SOLN COMPARISON:  07/02/19, MRI cervicothoracic and lumbar spine 11/25/2021 FINDINGS: CT CHEST FINDINGS Cardiovascular: Coronary artery calcification and aortic atherosclerotic calcification. Mediastinum/Nodes: No axillary or supraclavicular adenopathy. No mediastinal or hilar adenopathy. No pericardial fluid. Esophagus normal. Lungs/Pleura: Filling defect the proximal RIGHT lower lobe pulmonary artery (image 25/3). This thrombosis is partially occlusive. No additional evidence of pulmonary emboli. no evidence of RIGHT ventricular strain. Musculoskeletal: No aggressive osseous lesion. CT ABDOMEN AND PELVIS FINDINGS Hepatobiliary: No focal hepatic lesion. No biliary ductal dilatation. Gallbladder is normal. Common bile duct is normal No focal hepatic lesion. Postcholecystectomy. No biliary dilatation. Pancreas: Pancreas is normal. No ductal dilatation. No pancreatic inflammation. Spleen: Normal spleen Adrenals/urinary tract: Adrenal glands are normal. Low-density lesion in the RIGHT kidney measuring 24 mm is intermediate density on postcontrast exam. Ureters and bladder normal. Stomach/Bowel: Stomach, small bowel, appendix, and cecum are normal. Moderate volume stool in the rectum. LEFT colon normal Vascular/Lymphatic: Abdominal aorta is normal caliber with atherosclerotic calcification. There is no retroperitoneal or periportal lymphadenopathy. No pelvic lymphadenopathy. Reproductive: Prostate normal.  Penile prosthetic noted Other: No free fluid. Musculoskeletal: No aggressive osseous lesion.  IMPRESSION: Chest Impression: 1. Acute pulmonary embolism within the proximal RIGHT lower pulmonary artery. Overall clot burden is moderate. No RIGHT ventricular strain. 2. No evidence of malignancy in the thorax. 3. Coronary artery calcification and Aortic Atherosclerosis (ICD10-I70.0). Abdomen / Pelvis Impression: 1. No evidence of malignancy in the abdomen pelvis. 2. Indeterminate lesion in the RIGHT kidney is favored a benign cyst. This could be confirmed with renal MRI with contrast. 3. Moderate volume stool in the rectum. Electronically Signed: By: SSuzy BouchardM.D. On: 11/26/2021 19:51   MR BRAIN W WO CONTRAST  Result Date: 11/26/2021 CLINICAL DATA:  New bilateral lower leg weakness EXAM: MRI  HEAD WITHOUT AND WITH CONTRAST TECHNIQUE: Multiplanar, multiecho pulse sequences of the brain and surrounding structures were obtained without and with intravenous contrast. CONTRAST:  8.75m GADAVIST GADOBUTROL 1 MMOL/ML IV SOLN COMPARISON:  Prior CT imaging FINDINGS: Brain: There is no acute infarction or intracranial hemorrhage. There is no intracranial mass, mass effect, or edema. There is no hydrocephalus or extra-axial fluid collection. Prominence of the ventricles and sulci reflects parenchymal volume loss. There is disproportionate ventricular prominence. Patchy and confluent areas of T2 hyperintensity in the supratentorial white matter are nonspecific but may reflect moderate chronic microvascular ischemic changes. No abnormal enhancement. Vascular: Major vessel flow voids at the skull base are preserved. Skull and upper cervical spine: Normal marrow signal is preserved. Sinuses/Orbits: Paranasal sinuses are aerated. Orbits are unremarkable. Other: Sella is unremarkable.  Mastoid air cells are clear. IMPRESSION: No evidence of recent infarction, hemorrhage, or mass. No abnormal enhancement. Moderate chronic microvascular ischemic changes. Chronic disproportionate ventricular prominence likely reflecting  central volume loss. Normal pressure hydrocephalus is possible in the appropriate setting. Electronically Signed   By: PMacy MisM.D.   On: 11/26/2021 12:14   MR Cervical Spine W or Wo Contrast  Result Date: 11/26/2021 CLINICAL DATA:  Bilateral leg weakness history of rheumatoid arthritis, on Humira; progressive chronic right-sided weakness EXAM: MRI CERVICAL, THORACIC AND LUMBAR SPINE WITHOUT AND WITH CONTRAST TECHNIQUE: Multiplanar and multiecho pulse sequences of the cervical spine, to include the craniocervical junction and cervicothoracic junction, and thoracic and lumbar spine, were obtained without and with intravenous contrast. CONTRAST:  8.571mGADAVIST GADOBUTROL 1 MMOL/ML IV SOLN COMPARISON:  No prior MRI, correlation is made with CT cervical spine 05/14/2021, CT thoracic and lumbar spine 11/25/2021 and 05/14/2021 FINDINGS: MRI CERVICAL SPINE FINDINGS Alignment: No significant listhesis Vertebrae: No acute fracture or suspicious osseous lesion. No abnormal osseous enhancement. Cord: Normal signal and morphology. No abnormal enhancement. No epidural collection. Posterior Fossa, vertebral arteries, paraspinal tissues: Possible mega cisterna magna versus posterior fossa arachnoid cyst. Otherwise negative. Disc levels: C2-C3: Minimal disc bulge. Left-greater-than-right facet arthropathy. No spinal canal stenosis. Mild left neural foraminal narrowing. C3-C4: Mild disc bulge. Facet arthropathy. No spinal canal stenosis. Mild bilateral neural foraminal narrowing. C4-C5: Mild disc bulge. Facet and uncovertebral hypertrophy. No spinal canal stenosis. Severe left neural foraminal narrowing. C5-C6: Disc height loss with disc osteophyte complex. Facet and uncovertebral hypertrophy. No spinal canal stenosis. Mild left neural foraminal narrowing. C6-C7: Disc height loss with disc osteophyte complex. Facet and uncovertebral hypertrophy. No spinal canal stenosis. Mild bilateral neural foraminal narrowing. C7-T1:  Mild disc bulge. Facet and uncovertebral hypertrophy. No spinal canal stenosis or neural foraminal narrowing. MRI THORACIC SPINE FINDINGS Alignment: No significant listhesis. Slightly exaggerated thoracic kyphosis. S shaped curvature of the thoracolumbar spine. Vertebrae: Chronic compression deformity of T5, status post kyphoplasty. New vertebral body height loss anteriorly at T11, which does not demonstrate significant increased T2 signal and is favored to be subacute to chronic. No acute fracture or suspicious osseous lesion. No evidence of endplate cortical erosion. Increased T2 signal and contrast enhancement is seen in the intervertebral discs at T7-T8, T8-T9, T9-T10, T11-T12, and T12-L1. At T8-T9 and T11-T12, there is also Schmorl's node formation with increased T2 hyperintense signal and enhancement. Cord: Normal signal and morphology. No abnormal spinal cord enhancement. No epidural collection. Paraspinal and other soft tissues: No acute finding. Disc levels: Small disc protrusions or bulges at T6-T7, T7-T8, T8-T9, T9-T10, T10-T11, and T11-T12, which do not cause significant spinal canal stenosis. The T7-T8 disc protrusion  does indent the thecal sac and cause mild deformation of the spinal cord (series 17, image 24). Mild bilateral neural foraminal narrowing at T10-T11. MRI LUMBAR SPINE FINDINGS Segmentation:  Standard. Alignment: S shaped curvature of the thoracolumbar spine, with mild levocurvature of the lumbar spine. Trace retrolisthesis of L1 on L2. Vertebrae: No acute fracture or suspicious osseous lesion. No abnormal enhancement. Conus medullaris and cauda equina: Conus extends to the L1 level. Conus and cauda equina appear normal. No abnormal enhancement. No epidural collection. Paraspinal and other soft tissues: Right renal cyst. Disc levels: T12-L1: No significant disc bulge. No spinal canal stenosis or neural foraminal narrowing. L1-L2: Trace retrolisthesis and mild disc bulge. Mild facet  arthropathy. No spinal canal stenosis or neural foraminal narrowing. L2-L3: No significant disc bulge. Mild facet arthropathy. No spinal canal stenosis or neural foraminal narrowing. L3-L4: No significant disc bulge. Mild facet arthropathy. No spinal canal stenosis or neural foraminal narrowing. L4-L5: Mild disc bulge with right paracentral and left foraminal annular fissures. Mild facet arthropathy. Narrowing of the lateral recesses. No spinal canal stenosis or neural foraminal narrowing. L5-S1: Mild disc bulge with superimposed central protrusion. Mild facet arthropathy. No spinal canal stenosis. Mild-to-moderate bilateral neural foraminal narrowing. IMPRESSION: CERVICAL SPINE 1. No spinal canal stenosis or spinal cord abnormality. 2. Multilevel uncovertebral and facet arthropathy, which causes severe left neural foraminal narrowing at C4-C5, mild bilateral neural foraminal narrowing at C3-C4 and C6-C7, and mild left neural foraminal narrowing at C2-C3 and C5-C6. THORACIC SPINE 1. No spinal canal stenosis or spinal cord abnormality. 2. Increased T2 signal and contrast enhancement in the intervertebral discs from T6-T12, which is nonspecific but favored to represent degenerative changes and edema given normal signal in the adjacent endplates and vertebral bodies, although early discitis can appear similar. Correlate with lab values. 3. Anterior superior endplate deformity at A12 is favored to be subacute to chronic. No definite acute fracture in the thoracic spine. 4. Mild bilateral neural foraminal narrowing at T10-T11. LUMBAR SPINE 1. No spinal canal stenosis or spinal cord abnormality. 2. L5-S1 mild-to-moderate bilateral neural foraminal narrowing. Electronically Signed   By: Merilyn Baba M.D.   On: 11/26/2021 00:26   MR THORACIC SPINE W WO CONTRAST  Result Date: 11/26/2021 CLINICAL DATA:  Bilateral leg weakness history of rheumatoid arthritis, on Humira; progressive chronic right-sided weakness EXAM: MRI  CERVICAL, THORACIC AND LUMBAR SPINE WITHOUT AND WITH CONTRAST TECHNIQUE: Multiplanar and multiecho pulse sequences of the cervical spine, to include the craniocervical junction and cervicothoracic junction, and thoracic and lumbar spine, were obtained without and with intravenous contrast. CONTRAST:  8.24m GADAVIST GADOBUTROL 1 MMOL/ML IV SOLN COMPARISON:  No prior MRI, correlation is made with CT cervical spine 05/14/2021, CT thoracic and lumbar spine 11/25/2021 and 05/14/2021 FINDINGS: MRI CERVICAL SPINE FINDINGS Alignment: No significant listhesis Vertebrae: No acute fracture or suspicious osseous lesion. No abnormal osseous enhancement. Cord: Normal signal and morphology. No abnormal enhancement. No epidural collection. Posterior Fossa, vertebral arteries, paraspinal tissues: Possible mega cisterna magna versus posterior fossa arachnoid cyst. Otherwise negative. Disc levels: C2-C3: Minimal disc bulge. Left-greater-than-right facet arthropathy. No spinal canal stenosis. Mild left neural foraminal narrowing. C3-C4: Mild disc bulge. Facet arthropathy. No spinal canal stenosis. Mild bilateral neural foraminal narrowing. C4-C5: Mild disc bulge. Facet and uncovertebral hypertrophy. No spinal canal stenosis. Severe left neural foraminal narrowing. C5-C6: Disc height loss with disc osteophyte complex. Facet and uncovertebral hypertrophy. No spinal canal stenosis. Mild left neural foraminal narrowing. C6-C7: Disc height loss with disc osteophyte complex. Facet  and uncovertebral hypertrophy. No spinal canal stenosis. Mild bilateral neural foraminal narrowing. C7-T1: Mild disc bulge. Facet and uncovertebral hypertrophy. No spinal canal stenosis or neural foraminal narrowing. MRI THORACIC SPINE FINDINGS Alignment: No significant listhesis. Slightly exaggerated thoracic kyphosis. S shaped curvature of the thoracolumbar spine. Vertebrae: Chronic compression deformity of T5, status post kyphoplasty. New vertebral body height  loss anteriorly at T11, which does not demonstrate significant increased T2 signal and is favored to be subacute to chronic. No acute fracture or suspicious osseous lesion. No evidence of endplate cortical erosion. Increased T2 signal and contrast enhancement is seen in the intervertebral discs at T7-T8, T8-T9, T9-T10, T11-T12, and T12-L1. At T8-T9 and T11-T12, there is also Schmorl's node formation with increased T2 hyperintense signal and enhancement. Cord: Normal signal and morphology. No abnormal spinal cord enhancement. No epidural collection. Paraspinal and other soft tissues: No acute finding. Disc levels: Small disc protrusions or bulges at T6-T7, T7-T8, T8-T9, T9-T10, T10-T11, and T11-T12, which do not cause significant spinal canal stenosis. The T7-T8 disc protrusion does indent the thecal sac and cause mild deformation of the spinal cord (series 17, image 24). Mild bilateral neural foraminal narrowing at T10-T11. MRI LUMBAR SPINE FINDINGS Segmentation:  Standard. Alignment: S shaped curvature of the thoracolumbar spine, with mild levocurvature of the lumbar spine. Trace retrolisthesis of L1 on L2. Vertebrae: No acute fracture or suspicious osseous lesion. No abnormal enhancement. Conus medullaris and cauda equina: Conus extends to the L1 level. Conus and cauda equina appear normal. No abnormal enhancement. No epidural collection. Paraspinal and other soft tissues: Right renal cyst. Disc levels: T12-L1: No significant disc bulge. No spinal canal stenosis or neural foraminal narrowing. L1-L2: Trace retrolisthesis and mild disc bulge. Mild facet arthropathy. No spinal canal stenosis or neural foraminal narrowing. L2-L3: No significant disc bulge. Mild facet arthropathy. No spinal canal stenosis or neural foraminal narrowing. L3-L4: No significant disc bulge. Mild facet arthropathy. No spinal canal stenosis or neural foraminal narrowing. L4-L5: Mild disc bulge with right paracentral and left foraminal annular  fissures. Mild facet arthropathy. Narrowing of the lateral recesses. No spinal canal stenosis or neural foraminal narrowing. L5-S1: Mild disc bulge with superimposed central protrusion. Mild facet arthropathy. No spinal canal stenosis. Mild-to-moderate bilateral neural foraminal narrowing. IMPRESSION: CERVICAL SPINE 1. No spinal canal stenosis or spinal cord abnormality. 2. Multilevel uncovertebral and facet arthropathy, which causes severe left neural foraminal narrowing at C4-C5, mild bilateral neural foraminal narrowing at C3-C4 and C6-C7, and mild left neural foraminal narrowing at C2-C3 and C5-C6. THORACIC SPINE 1. No spinal canal stenosis or spinal cord abnormality. 2. Increased T2 signal and contrast enhancement in the intervertebral discs from T6-T12, which is nonspecific but favored to represent degenerative changes and edema given normal signal in the adjacent endplates and vertebral bodies, although early discitis can appear similar. Correlate with lab values. 3. Anterior superior endplate deformity at F68 is favored to be subacute to chronic. No definite acute fracture in the thoracic spine. 4. Mild bilateral neural foraminal narrowing at T10-T11. LUMBAR SPINE 1. No spinal canal stenosis or spinal cord abnormality. 2. L5-S1 mild-to-moderate bilateral neural foraminal narrowing. Electronically Signed   By: Merilyn Baba M.D.   On: 11/26/2021 00:26   MR Lumbar Spine W Wo Contrast  Result Date: 11/26/2021 CLINICAL DATA:  Bilateral leg weakness history of rheumatoid arthritis, on Humira; progressive chronic right-sided weakness EXAM: MRI CERVICAL, THORACIC AND LUMBAR SPINE WITHOUT AND WITH CONTRAST TECHNIQUE: Multiplanar and multiecho pulse sequences of the cervical spine, to include  the craniocervical junction and cervicothoracic junction, and thoracic and lumbar spine, were obtained without and with intravenous contrast. CONTRAST:  8.63m GADAVIST GADOBUTROL 1 MMOL/ML IV SOLN COMPARISON:  No prior MRI,  correlation is made with CT cervical spine 05/14/2021, CT thoracic and lumbar spine 11/25/2021 and 05/14/2021 FINDINGS: MRI CERVICAL SPINE FINDINGS Alignment: No significant listhesis Vertebrae: No acute fracture or suspicious osseous lesion. No abnormal osseous enhancement. Cord: Normal signal and morphology. No abnormal enhancement. No epidural collection. Posterior Fossa, vertebral arteries, paraspinal tissues: Possible mega cisterna magna versus posterior fossa arachnoid cyst. Otherwise negative. Disc levels: C2-C3: Minimal disc bulge. Left-greater-than-right facet arthropathy. No spinal canal stenosis. Mild left neural foraminal narrowing. C3-C4: Mild disc bulge. Facet arthropathy. No spinal canal stenosis. Mild bilateral neural foraminal narrowing. C4-C5: Mild disc bulge. Facet and uncovertebral hypertrophy. No spinal canal stenosis. Severe left neural foraminal narrowing. C5-C6: Disc height loss with disc osteophyte complex. Facet and uncovertebral hypertrophy. No spinal canal stenosis. Mild left neural foraminal narrowing. C6-C7: Disc height loss with disc osteophyte complex. Facet and uncovertebral hypertrophy. No spinal canal stenosis. Mild bilateral neural foraminal narrowing. C7-T1: Mild disc bulge. Facet and uncovertebral hypertrophy. No spinal canal stenosis or neural foraminal narrowing. MRI THORACIC SPINE FINDINGS Alignment: No significant listhesis. Slightly exaggerated thoracic kyphosis. S shaped curvature of the thoracolumbar spine. Vertebrae: Chronic compression deformity of T5, status post kyphoplasty. New vertebral body height loss anteriorly at T11, which does not demonstrate significant increased T2 signal and is favored to be subacute to chronic. No acute fracture or suspicious osseous lesion. No evidence of endplate cortical erosion. Increased T2 signal and contrast enhancement is seen in the intervertebral discs at T7-T8, T8-T9, T9-T10, T11-T12, and T12-L1. At T8-T9 and T11-T12, there is  also Schmorl's node formation with increased T2 hyperintense signal and enhancement. Cord: Normal signal and morphology. No abnormal spinal cord enhancement. No epidural collection. Paraspinal and other soft tissues: No acute finding. Disc levels: Small disc protrusions or bulges at T6-T7, T7-T8, T8-T9, T9-T10, T10-T11, and T11-T12, which do not cause significant spinal canal stenosis. The T7-T8 disc protrusion does indent the thecal sac and cause mild deformation of the spinal cord (series 17, image 24). Mild bilateral neural foraminal narrowing at T10-T11. MRI LUMBAR SPINE FINDINGS Segmentation:  Standard. Alignment: S shaped curvature of the thoracolumbar spine, with mild levocurvature of the lumbar spine. Trace retrolisthesis of L1 on L2. Vertebrae: No acute fracture or suspicious osseous lesion. No abnormal enhancement. Conus medullaris and cauda equina: Conus extends to the L1 level. Conus and cauda equina appear normal. No abnormal enhancement. No epidural collection. Paraspinal and other soft tissues: Right renal cyst. Disc levels: T12-L1: No significant disc bulge. No spinal canal stenosis or neural foraminal narrowing. L1-L2: Trace retrolisthesis and mild disc bulge. Mild facet arthropathy. No spinal canal stenosis or neural foraminal narrowing. L2-L3: No significant disc bulge. Mild facet arthropathy. No spinal canal stenosis or neural foraminal narrowing. L3-L4: No significant disc bulge. Mild facet arthropathy. No spinal canal stenosis or neural foraminal narrowing. L4-L5: Mild disc bulge with right paracentral and left foraminal annular fissures. Mild facet arthropathy. Narrowing of the lateral recesses. No spinal canal stenosis or neural foraminal narrowing. L5-S1: Mild disc bulge with superimposed central protrusion. Mild facet arthropathy. No spinal canal stenosis. Mild-to-moderate bilateral neural foraminal narrowing. IMPRESSION: CERVICAL SPINE 1. No spinal canal stenosis or spinal cord  abnormality. 2. Multilevel uncovertebral and facet arthropathy, which causes severe left neural foraminal narrowing at C4-C5, mild bilateral neural foraminal narrowing at C3-C4 and C6-C7,  and mild left neural foraminal narrowing at C2-C3 and C5-C6. THORACIC SPINE 1. No spinal canal stenosis or spinal cord abnormality. 2. Increased T2 signal and contrast enhancement in the intervertebral discs from T6-T12, which is nonspecific but favored to represent degenerative changes and edema given normal signal in the adjacent endplates and vertebral bodies, although early discitis can appear similar. Correlate with lab values. 3. Anterior superior endplate deformity at X72 is favored to be subacute to chronic. No definite acute fracture in the thoracic spine. 4. Mild bilateral neural foraminal narrowing at T10-T11. LUMBAR SPINE 1. No spinal canal stenosis or spinal cord abnormality. 2. L5-S1 mild-to-moderate bilateral neural foraminal narrowing. Electronically Signed   By: Merilyn Baba M.D.   On: 11/26/2021 00:26   CT THORACIC SPINE W CONTRAST  Result Date: 11/25/2021 CLINICAL DATA:  Ataxia, nontraumatic, thoracic pathology suspected. EXAM: CT THORACIC SPINE WITH CONTRAST TECHNIQUE: Multidetector CT images of thoracic was performed according to the standard protocol following intravenous contrast administration. RADIATION DOSE REDUCTION: This exam was performed according to the departmental dose-optimization program which includes automated exposure control, adjustment of the mA and/or kV according to patient size and/or use of iterative reconstruction technique. CONTRAST:  165m OMNIPAQUE IOHEXOL 300 MG/ML  SOLN COMPARISON:  CT of the thoracic spine 05/14/2021. Thoracic spine radiographs 06/18/2021. FINDINGS: Alignment: No significant listhesis is present. Thoracic kyphosis is stable. Vertebrae: Spinal augmentation noted at T5. Anterior superior endplate fracture with slight loss of height at T11 is new since the prior  exams. For posterior 2/3 of the vertebral body are maintained. No other fractures are present. Paraspinal and other soft tissues: Paraspinous soft tissues are within normal limits. Mild dependent atelectasis is present the lungs bilaterally. Atherosclerotic calcifications present in the aorta without aneurysm. Visualized upper abdomen is within normal limits. Patient is status post cholecystectomy. Disc levels: No significant thoracic disc disease is present. Foramina are patent. IMPRESSION: 1. Anterior superior endplate fracture with slight loss of height at T11 is new since the prior exams. Acuity is indeterminate. 2. Spinal augmentation at T5. 3. Stable thoracic kyphosis. 4. No significant disc disease or stenosis. 5. Aortic Atherosclerosis (ICD10-I70.0). Electronically Signed   By: CSan MorelleM.D.   On: 11/25/2021 12:49   CT LUMBAR SPINE W CONTRAST  Result Date: 11/25/2021 CLINICAL DATA:  Ataxia, nontraumatic, lumbar pathology suspected. EXAM: CT LUMBAR SPINE WITH CONTRAST TECHNIQUE: Multidetector CT imaging of the lumbar spine was performed with intravenous contrast administration. RADIATION DOSE REDUCTION: This exam was performed according to the departmental dose-optimization program which includes automated exposure control, adjustment of the mA and/or kV according to patient size and/or use of iterative reconstruction technique. CONTRAST:  1045mOMNIPAQUE IOHEXOL 300 MG/ML  SOLN COMPARISON:  CT of the lumbar spine without contrast 05/14/2021. FINDINGS: Segmentation: 5 non rib-bearing lumbar type vertebral bodies are present. The lowest fully formed vertebral body is L5. Alignment: No significant listhesis is present. Mild straightening of the scratched at slight leftward curvature is noted. Vertebrae: Vertebral body heights are maintained. No focal osseous lesions are present. Paraspinal and other soft tissues: Atherosclerotic calcifications are present the aorta and branch vessels,  particularly the splenic artery. No solid organ lesions are present. No significant adenopathy is present. Disc levels: L1-2: Mild facet hypertrophy is present. No significant disc protrusion or stenosis is present. L2-3: Moderate facet hypertrophy is present. No significant disc protrusion or stenosis is present. L3-4: Mild disc bulging is present. Moderate facet hypertrophy is noted. No significant focal stenosis is present. L4-5: A  broad-based disc protrusion present. Moderate facet hypertrophy and ligamentum flavum thickening is noted. No significant stenosis or change is present. L5-S1: A broad-based disc protrusion is present. Asymmetric left-sided facet hypertrophy and ligamentum flavum thickening contributes to mild left subarticular narrowing. Moderate foraminal stenosis is similar the prior study, left greater than right. IMPRESSION: 1. Mild left subarticular and moderate foraminal stenosis at L5-S1 secondary to a broad-based disc protrusion and asymmetric left-sided facet hypertrophy and ligamentum flavum thickening. This is similar to the prior study, left greater than right. 2. Mild disc bulging and moderate facet hypertrophy at L3-4 and L4-5 without significant stenosis or change. 3. Aortic Atherosclerosis (ICD10-I70.0). Electronically Signed   By: San Morelle M.D.   On: 11/25/2021 12:42   DG Pelvis 1-2 Views  Result Date: 11/24/2021 CLINICAL DATA:  Weakness, fall EXAM: PELVIS - 1-2 VIEW COMPARISON:  05/14/2021 FINDINGS: There is no evidence of pelvic fracture or diastasis. 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 Bones are demineralized. IMPRESSION: Negative. Electronically Signed   By: Davina Poke D.O.   On: 11/24/2021 21:52   DG Chest 2 View  Result Date: 11/24/2021 CLINICAL DATA:  Weakness, fall EXAM: CHEST - 2 VIEW COMPARISON:  07/02/2019 FINDINGS: The heart size and mediastinal contours are within normal limits. Low lung volumes.  Streaky left basilar opacity. Right lung is clear. No pleural effusion or pneumothorax. The visualized skeletal structures are unremarkable. IMPRESSION: Low lung volumes with streaky left basilar opacity, atelectasis versus infiltrate. Electronically Signed   By: Davina Poke D.O.   On: 11/24/2021 21:51   CT HEAD WO CONTRAST (5MM)  Result Date: 11/24/2021 CLINICAL DATA:  Transient ischemic attack EXAM: CT HEAD WITHOUT CONTRAST TECHNIQUE: Contiguous axial images were obtained from the base of the skull through the vertex without intravenous contrast. RADIATION DOSE REDUCTION: This exam was performed according to the departmental dose-optimization program which includes automated exposure control, adjustment of the mA and/or kV according to patient size and/or use of iterative reconstruction technique. COMPARISON:  05/14/2021 FINDINGS: Brain: Normal anatomic configuration. Parenchymal volume loss is commensurate with the patient's age. Moderate periventricular white matter changes are present likely reflecting the sequela of small vessel ischemia. No abnormal intra or extra-axial mass lesion or fluid collection. No abnormal mass effect or midline shift. No evidence of acute intracranial hemorrhage or infarct. Mild ventriculomegaly appears stable since prior examination and is slightly disproportionate to the degree of parenchymal volume loss suggesting either asymmetric central atrophy or communicating hydrocephalus. Cerebellum unremarkable. Vascular: No asymmetric hyperdense vasculature at the skull base. Skull: Intact Sinuses/Orbits: Paranasal sinuses are clear. Ocular lenses have been removed. Orbits are otherwise unremarkable. Other: Mastoid air cells and middle ear cavities are clear. IMPRESSION: 1. No acute intracranial hemorrhage or infarct. 2. Stable ventriculomegaly, slightly disproportionate to the degree of parenchymal volume loss suggesting either asymmetric central atrophy or communicating  hydrocephalus. 3. Stable periventricular white matter changes, likely reflecting the sequela of small vessel ischemia. Electronically Signed   By: Fidela Salisbury M.D.   On: 11/24/2021 21:14     TODAY-DAY OF DISCHARGE:  Subjective:   William Hunter today has no headache,no chest abdominal pain,no new weakness tingling or numbness, feels much better wants to go home today.   Objective:   Blood pressure (!) 143/83, pulse (!) 106, temperature 98.2 F (36.8 C), temperature source Oral, resp. rate 19, height 5' 11.5" (1.816 m), weight 82.3 kg, SpO2 97 %.  Intake/Output Summary (Last 24 hours) at 11/29/2021 1003 Last data filed  at 11/29/2021 0341 Gross per 24 hour  Intake --  Output 600 ml  Net -600 ml   Filed Weights   11/26/21 2044  Weight: 82.3 kg    Exam: Awake Alert, Oriented *3, No new F.N deficits, Normal affect [place].AT,PERRAL Supple Neck,No JVD, No cervical lymphadenopathy appriciated.  Symmetrical Chest wall movement, Good air movement bilaterally, CTAB RRR,No Gallops,Rubs or new Murmurs, No Parasternal Heave +ve B.Sounds, Abd Soft, Non tender, No organomegaly appriciated, No rebound -guarding or rigidity. No Cyanosis, Clubbing or edema, No new Rash or bruise   PERTINENT RADIOLOGIC STUDIES: VAS [place] LOWER EXTREMITY VENOUS (DVT)  Result Date: 11/27/2021  Lower Venous DVT Study Patient Name:  William Hunter  Date of Exam:   11/27/2021 Medical Rec #: [id number]      Accession #:    [id number] Date of Birth: [birth year]      Patient Gender: M Patient Age:   54 years Exam Location:  [facility] Procedure:      VAS [place] LOWER EXTREMITY VENOUS (DVT) Referring Phys: Terrilee Croak --------------------------------------------------------------------------------  Indications: Pulmonary embolism, and history of left DVT.  Anticoagulation: Heparin. Comparison Study: 07-03-2019 Prior left lower extremity venous was positive for                   acute DVT involving the profunda femoral vein.  Performing Technologist: Darlin Coco RDMS, RVT  Examination Guidelines: A complete evaluation includes B-mode imaging, spectral Doppler, color Doppler, and power Doppler as needed of all accessible portions of each vessel. Bilateral testing is considered an integral part of a complete examination. Limited examinations for reoccurring indications may be performed as noted. The reflux portion of the exam is performed with the patient in reverse Trendelenburg.  +---------+---------------+---------+-----------+----------+--------------+ RIGHT    CompressibilityPhasicitySpontaneityPropertiesThrombus Aging +---------+---------------+---------+-----------+----------+--------------+ CFV      Full           Yes      Yes                                 +---------+---------------+---------+-----------+----------+--------------+ SFJ      Full                                                        +---------+---------------+---------+-----------+----------+--------------+ FV Prox  Full                                                        +---------+---------------+---------+-----------+----------+--------------+ FV Mid   Full                                                        +---------+---------------+---------+-----------+----------+--------------+ FV DistalFull                                                        +---------+---------------+---------+-----------+----------+--------------+  PFV      Full                                                        +---------+---------------+---------+-----------+----------+--------------+ POP      Full           Yes      Yes                                 +---------+---------------+---------+-----------+----------+--------------+ PTV      Full                                                        +---------+---------------+---------+-----------+----------+--------------+ PERO     Full                                                         +---------+---------------+---------+-----------+----------+--------------+   +---------+---------------+---------+-----------+----------+-----------------+ LEFT     CompressibilityPhasicitySpontaneityPropertiesThrombus Aging    +---------+---------------+---------+-----------+----------+-----------------+ CFV      Partial        Yes      Yes                  Acute             +---------+---------------+---------+-----------+----------+-----------------+ SFJ      Partial        Yes      Yes                  Acute             +---------+---------------+---------+-----------+----------+-----------------+ FV Prox  None           No       No                   Acute             +---------+---------------+---------+-----------+----------+-----------------+ FV Mid   None           No       No                   Age Indeterminate +---------+---------------+---------+-----------+----------+-----------------+ FV DistalNone           No       No                   Age Indeterminate +---------+---------------+---------+-----------+----------+-----------------+ PFV      Full                                                           +---------+---------------+---------+-----------+----------+-----------------+ POP      None           No       No  Age Indeterminate +---------+---------------+---------+-----------+----------+-----------------+ PTV      Full                                                           +---------+---------------+---------+-----------+----------+-----------------+ PERO     None           No       No                   Acute             +---------+---------------+---------+-----------+----------+-----------------+ Gastroc  Full                                                           +---------+---------------+---------+-----------+----------+-----------------+ EIV                      Yes      Yes                                    +---------+---------------+---------+-----------+----------+-----------------+     Summary: RIGHT: - There is no evidence of deep vein thrombosis in the lower extremity.  - No cystic structure found in the popliteal fossa.  LEFT: - Findings consistent with acute deep vein thrombosis involving the left common femoral vein, SF junction, left femoral vein, and left peroneal veins.  - Findings consistent with age indeterminate deep vein thrombosis involving the left popliteal vein.  - No cystic structure found in the popliteal fossa.  - The common femoral vein obstruction does not appear to extend above inguinal ligament.  *See table(s) above for measurements and observations. Electronically signed by Deitra Mayo MD on 11/27/2021 at 4:46:40 PM.    Final    ECHOCARDIOGRAM COMPLETE  Result Date: 11/27/2021    ECHOCARDIOGRAM REPORT   Patient Name:   William Hunter Date of Exam: 11/27/2021 Medical Rec #:  [id number]     Height:       71.5 in Accession #:    [id number]    Weight:       181.4 lb Date of Birth:  [birth year]     BSA:          2.034 m Patient Age:    47 years      BP:           141/70 mmHg Patient Gender: M             HR:           89 bpm. Exam Location:  Inpatient Procedure: 2D Echo, Cardiac Doppler, Color Doppler and Intracardiac            Opacification Agent Indications:    Pulmonary embolus  History:        Patient has prior history of Echocardiogram examinations, most                 recent 02/22/2020. Previous Myocardial Infarction and CAD; Risk                 Factors:Diabetes and Hypertension.  Sonographer:  Clayton Lefort RDCS (AE) Referring Phys: [id number] William Hunter  Sonographer Comments: No subcostal window, Technically challenging study due to limited acoustic windows, Technically difficult study due to poor echo windows, suboptimal parasternal window and suboptimal apical window. IMPRESSIONS  1. Left ventricular ejection fraction,  by estimation, is 60 to 65%. The left ventricle has normal function. The left ventricle has no regional wall motion abnormalities. Left ventricular diastolic parameters are consistent with Grade I diastolic dysfunction (impaired relaxation).  2. Right ventricular systolic function is normal. The right ventricular size is normal. Tricuspid regurgitation signal is inadequate for assessing PA pressure.  3. The mitral valve was not well visualized. No evidence of mitral valve regurgitation.  4. The aortic valve was not well visualized. Aortic valve regurgitation is not visualized. No aortic stenosis is present.  5. Aortic dilatation noted. There is mild dilatation of the ascending aorta, measuring 40 mm.  6. Technically difficult study Comparison(s): No significant change from prior study. FINDINGS  Left Ventricle: Left ventricular ejection fraction, by estimation, is 60 to 65%. The left ventricle has normal function. The left ventricle has no regional wall motion abnormalities. Definity contrast agent was given IV to delineate the left ventricular  endocardial borders. The left ventricular internal cavity size was normal in size. There is no left ventricular hypertrophy. Left ventricular diastolic parameters are consistent with Grade I diastolic dysfunction (impaired relaxation). Right Ventricle: The right ventricular size is normal. No increase in right ventricular wall thickness. Right ventricular systolic function is normal. Tricuspid regurgitation signal is inadequate for assessing PA pressure. Left Atrium: Left atrial size was normal in size. Right Atrium: Right atrial size was normal in size. Pericardium: There is no evidence of pericardial effusion. Presence of epicardial fat layer. Mitral Valve: The mitral valve was not well visualized. No evidence of mitral valve regurgitation. Tricuspid Valve: The tricuspid valve is normal in structure. Tricuspid valve regurgitation is not demonstrated. No evidence of  tricuspid stenosis. Aortic Valve: The aortic valve was not well visualized. Aortic valve regurgitation is not visualized. No aortic stenosis is present. Aortic valve mean gradient measures 2.0 mmHg. Aortic valve peak gradient measures 4.0 mmHg. Aortic valve area, by VTI measures 4.41 cm. Pulmonic Valve: The pulmonic valve was not well visualized. Pulmonic valve regurgitation is not visualized. Aorta: Aortic dilatation noted. There is mild dilatation of the ascending aorta, measuring 40 mm. Venous: The inferior vena cava was not well visualized. IAS/Shunts: No atrial level shunt detected by color flow Doppler.  LEFT VENTRICLE PLAX 2D LVOT diam:     2.30 cm   Diastology LV SV:         78        LV e' medial:    4.13 cm/s LV SV Index:   38        LV E/e' medial:  13.2 LVOT Area:     4.15 cm  LV e' lateral:   8.16 cm/s                          LV E/e' lateral: 6.7  RIGHT VENTRICLE RV Basal diam:  3.10 cm RV S prime:     14.80 cm/s TAPSE (M-mode): 1.7 cm LEFT ATRIUM             Index        RIGHT ATRIUM           Index LA Vol (A2C):   30.9 ml 15.19 ml/m  RA  Area:     12.90 cm LA Vol (A4C):   38.9 ml 19.13 ml/m  RA Volume:   26.20 ml  12.88 ml/m LA Biplane Vol: 38.2 ml 18.78 ml/m  AORTIC VALVE AV Area (Vmax):    3.42 cm AV Area (Vmean):   3.36 cm AV Area (VTI):     4.41 cm AV Vmax:           100.00 cm/s AV Vmean:          72.200 cm/s AV VTI:            0.177 m AV Peak Grad:      4.0 mmHg AV Mean Grad:      2.0 mmHg LVOT Vmax:         82.20 cm/s LVOT Vmean:        58.400 cm/s LVOT VTI:          0.188 m LVOT/AV VTI ratio: 1.06  AORTA Ao Root diam: 3.60 cm Ao Asc diam:  3.90 cm MITRAL VALVE MV Area (PHT): 3.60 cm    SHUNTS MV Decel Time: 211 msec    Systemic VTI:  0.19 m MV E velocity: 54.40 cm/s  Systemic Diam: 2.30 cm MV A velocity: 90.40 cm/s MV E/A ratio:  0.60 Rudean Haskell MD Electronically signed by Rudean Haskell MD Signature Date/Time: 11/27/2021/1:31:59 PM    Final      PERTINENT LAB  RESULTS: CBC: Recent Labs    11/28/21 0449  WBC 14.3*  HGB 16.0  HCT 45.5  PLT 268   CMET CMP     Component Value Date/Time   NA 133 (L) 11/28/2021 0449   K 3.5 11/28/2021 0449   CL 99 11/28/2021 0449   CO2 22 11/28/2021 0449   GLUCOSE 181 (H) 11/28/2021 0449   BUN 15 11/28/2021 0449   CREATININE 0.75 11/28/2021 0449   CALCIUM 9.1 11/28/2021 0449   PROT 6.5 11/24/2021 2133   ALBUMIN 3.2 (L) 11/24/2021 2133   AST 12 (L) 11/24/2021 2133   ALT 15 11/24/2021 2133   ALKPHOS 68 11/24/2021 2133   BILITOT 0.4 11/24/2021 2133   GFRNONAA >60 11/28/2021 0449   GFRAA >60 07/05/2019 0537    GFR Estimated Creatinine Clearance: 89 mL/min (by C-G formula based on SCr of 0.75 mg/dL). No results for input(s): "LIPASE", "AMYLASE" in the last 72 hours. No results for input(s): "CKTOTAL", "CKMB", "CKMBINDEX", "TROPONINI" in the last 72 hours. Invalid input(s): "POCBNP" No results for input(s): "DDIMER" in the last 72 hours. No results for input(s): "HGBA1C" in the last 72 hours. No results for input(s): "CHOL", "HDL", "LDLCALC", "TRIG", "CHOLHDL", "LDLDIRECT" in the last 72 hours. No results for input(s): "TSH", "T4TOTAL", "T3FREE", "THYROIDAB" in the last 72 hours.  Invalid input(s): "FREET3" No results for input(s): "VITAMINB12", "FOLATE", "FERRITIN", "TIBC", "IRON", "RETICCTPCT" in the last 72 hours. Coags: Recent Labs    11/26/21 2053  INR 1.0   Microbiology: No results found for this or any previous visit (from the past 240 hour(s)).  FURTHER DISCHARGE INSTRUCTIONS:  Get Medicines reviewed and adjusted: Please take all your medications with you for your next visit with your Primary MD  Laboratory/radiological data: Please request your Primary MD to go over all hospital tests and procedure/radiological results at the follow up, please ask your Primary MD to get all Hospital records sent to his/her office.  In some cases, they will be blood work, cultures and biopsy results  pending at the time of your discharge. Please request that  your primary care M.D. goes through all the records of your hospital data and follows up on these results.  Also Note the following: If you experience worsening of your admission symptoms, develop shortness of breath, life threatening emergency, suicidal or homicidal thoughts you must seek medical attention immediately by calling 911 or calling your MD immediately  if symptoms less severe.  You must read complete instructions/literature along with all the possible adverse reactions/side effects for all the Medicines you take and that have been prescribed to you. Take any new Medicines after you have completely understood and accpet all the possible adverse reactions/side effects.   Do not drive when taking Pain medications or sleeping medications (Benzodaizepines)  Do not take more than prescribed Pain, Sleep and Anxiety Medications. It is not advisable to combine anxiety,sleep and pain medications without talking with your primary care practitioner  Special Instructions: If you have smoked or chewed Tobacco  in the last 2 yrs please stop smoking, stop any regular Alcohol  and or any Recreational drug use.  Wear Seat belts while driving.  Please note: You were cared for by a hospitalist during your hospital stay. Once you are discharged, your primary care physician will handle any further medical issues. Please note that NO REFILLS for any discharge medications will be authorized once you are discharged, as it is imperative that you return to your primary care physician (or establish a relationship with a primary care physician if you do not have one) for your post hospital discharge needs so that they can reassess your need for medications and monitor your lab values.  Total Time spent coordinating discharge including counseling, education and face to face time equals greater than 30 minutes.  SignedOren Binet 11/29/2021 10:03  AM

## 2021-11-29 NOTE — Plan of Care (Signed)
Problem: Education: Goal: Ability to describe self-care measures that may prevent or decrease complications (Diabetes Survival Skills Education) will improve 11/29/2021 1008 by Waynette Buttery, RN Outcome: Adequate for Discharge 11/29/2021 0747 by Waynette Buttery, RN Outcome: Progressing Goal: Individualized Educational Video(s) 11/29/2021 1008 by Waynette Buttery, RN Outcome: Adequate for Discharge 11/29/2021 0747 by Waynette Buttery, RN Outcome: Progressing   Problem: Coping: Goal: Ability to adjust to condition or change in health will improve 11/29/2021 1008 by Waynette Buttery, RN Outcome: Adequate for Discharge 11/29/2021 0747 by Waynette Buttery, RN Outcome: Progressing   Problem: Fluid Volume: Goal: Ability to maintain a balanced intake and output will improve 11/29/2021 1008 by Waynette Buttery, RN Outcome: Adequate for Discharge 11/29/2021 0747 by Waynette Buttery, RN Outcome: Progressing   Problem: Health Behavior/Discharge Planning: Goal: Ability to identify and utilize available resources and services will improve 11/29/2021 1008 by Waynette Buttery, RN Outcome: Adequate for Discharge 11/29/2021 0747 by Waynette Buttery, RN Outcome: Progressing Goal: Ability to manage health-related needs will improve 11/29/2021 1008 by Waynette Buttery, RN Outcome: Adequate for Discharge 11/29/2021 0747 by Waynette Buttery, RN Outcome: Progressing   Problem: Metabolic: Goal: Ability to maintain appropriate glucose levels will improve 11/29/2021 1008 by Waynette Buttery, RN Outcome: Adequate for Discharge 11/29/2021 0747 by Waynette Buttery, RN Outcome: Progressing   Problem: Nutritional: Goal: Maintenance of adequate nutrition will improve 11/29/2021 1008 by Waynette Buttery, RN Outcome: Adequate for Discharge 11/29/2021 0747 by Waynette Buttery, RN Outcome: Progressing Goal: Progress toward achieving an optimal weight will improve 11/29/2021 1008 by Waynette Buttery, RN Outcome: Adequate for Discharge 11/29/2021 0747 by Waynette Buttery, RN Outcome: Progressing   Problem: Skin Integrity: Goal: Risk for impaired skin integrity will decrease 11/29/2021 1008 by Waynette Buttery, RN Outcome: Adequate for Discharge 11/29/2021 0747 by Waynette Buttery, RN Outcome: Progressing   Problem: Tissue Perfusion: Goal: Adequacy of tissue perfusion will improve 11/29/2021 1008 by Waynette Buttery, RN Outcome: Adequate for Discharge 11/29/2021 0747 by Waynette Buttery, RN Outcome: Progressing   Problem: Education: Goal: Knowledge of General Education information will improve Description: Including pain rating scale, medication(s)/side effects and non-pharmacologic comfort measures 11/29/2021 1008 by Waynette Buttery, RN Outcome: Adequate for Discharge 11/29/2021 0747 by Waynette Buttery, RN Outcome: Progressing   Problem: Health Behavior/Discharge Planning: Goal: Ability to manage health-related needs will improve 11/29/2021 1008 by Waynette Buttery, RN Outcome: Adequate for Discharge 11/29/2021 0747 by Waynette Buttery, RN Outcome: Progressing   Problem: Clinical Measurements: Goal: Ability to maintain clinical measurements within normal limits will improve 11/29/2021 1008 by Waynette Buttery, RN Outcome: Adequate for Discharge 11/29/2021 0747 by Waynette Buttery, RN Outcome: Progressing Goal: Will remain free from infection 11/29/2021 1008 by Waynette Buttery, RN Outcome: Adequate for Discharge 11/29/2021 0747 by Waynette Buttery, RN Outcome: Progressing Goal: Diagnostic test results will improve 11/29/2021 1008 by Waynette Buttery, RN Outcome: Adequate for Discharge 11/29/2021 0747 by Waynette Buttery, RN Outcome: Progressing Goal: Respiratory complications will improve 11/29/2021 1008 by Waynette Buttery, RN Outcome: Adequate for Discharge 11/29/2021 0747 by Waynette Buttery, RN Outcome: Progressing Goal: Cardiovascular complication will be avoided 11/29/2021 1008 by Waynette Buttery, RN Outcome: Adequate for Discharge 11/29/2021 0747 by Waynette Buttery, RN Outcome:  Progressing   Problem: Activity: Goal: Risk for activity intolerance will decrease 11/29/2021 1008 by Annaclaire Walsworth A, RN Outcome: Adequate for Discharge  11/29/2021 0747 by Waynette Buttery, RN Outcome: Progressing   Problem: Nutrition: Goal: Adequate nutrition will be maintained 11/29/2021 1008 by Waynette Buttery, RN Outcome: Adequate for Discharge 11/29/2021 0747 by Waynette Buttery, RN Outcome: Progressing   Problem: Coping: Goal: Level of anxiety will decrease 11/29/2021 1008 by Waynette Buttery, RN Outcome: Adequate for Discharge 11/29/2021 0747 by Waynette Buttery, RN Outcome: Progressing   Problem: Elimination: Goal: Will not experience complications related to bowel motility 11/29/2021 1008 by Waynette Buttery, RN Outcome: Adequate for Discharge 11/29/2021 0747 by Waynette Buttery, RN Outcome: Progressing Goal: Will not experience complications related to urinary retention 11/29/2021 1008 by Waynette Buttery, RN Outcome: Adequate for Discharge 11/29/2021 0747 by Waynette Buttery, RN Outcome: Progressing   Problem: Pain Managment: Goal: General experience of comfort will improve 11/29/2021 1008 by Waynette Buttery, RN Outcome: Adequate for Discharge 11/29/2021 0747 by Waynette Buttery, RN Outcome: Progressing   Problem: Safety: Goal: Ability to remain free from injury will improve 11/29/2021 1008 by Waynette Buttery, RN Outcome: Adequate for Discharge 11/29/2021 0747 by Waynette Buttery, RN Outcome: Progressing   Problem: Skin Integrity: Goal: Risk for impaired skin integrity will decrease 11/29/2021 1008 by Waynette Buttery, RN Outcome: Adequate for Discharge 11/29/2021 0747 by Waynette Buttery, RN Outcome: Progressing

## 2021-11-30 DIAGNOSIS — R29898 Other symptoms and signs involving the musculoskeletal system: Secondary | ICD-10-CM | POA: Diagnosis not present

## 2021-11-30 DIAGNOSIS — M069 Rheumatoid arthritis, unspecified: Secondary | ICD-10-CM | POA: Diagnosis not present

## 2021-11-30 DIAGNOSIS — I2699 Other pulmonary embolism without acute cor pulmonale: Secondary | ICD-10-CM | POA: Diagnosis not present

## 2021-11-30 DIAGNOSIS — I82409 Acute embolism and thrombosis of unspecified deep veins of unspecified lower extremity: Secondary | ICD-10-CM | POA: Diagnosis not present

## 2021-12-03 DIAGNOSIS — I10 Essential (primary) hypertension: Secondary | ICD-10-CM | POA: Diagnosis not present

## 2021-12-03 DIAGNOSIS — E119 Type 2 diabetes mellitus without complications: Secondary | ICD-10-CM | POA: Diagnosis not present

## 2021-12-03 DIAGNOSIS — M069 Rheumatoid arthritis, unspecified: Secondary | ICD-10-CM | POA: Diagnosis not present

## 2021-12-03 DIAGNOSIS — M6281 Muscle weakness (generalized): Secondary | ICD-10-CM | POA: Diagnosis not present

## 2021-12-03 DIAGNOSIS — E785 Hyperlipidemia, unspecified: Secondary | ICD-10-CM | POA: Diagnosis not present

## 2021-12-03 DIAGNOSIS — I2699 Other pulmonary embolism without acute cor pulmonale: Secondary | ICD-10-CM | POA: Diagnosis not present

## 2021-12-03 DIAGNOSIS — I5030 Unspecified diastolic (congestive) heart failure: Secondary | ICD-10-CM | POA: Diagnosis not present

## 2021-12-03 DIAGNOSIS — I82402 Acute embolism and thrombosis of unspecified deep veins of left lower extremity: Secondary | ICD-10-CM | POA: Diagnosis not present

## 2021-12-03 DIAGNOSIS — K5900 Constipation, unspecified: Secondary | ICD-10-CM | POA: Diagnosis not present

## 2021-12-04 DIAGNOSIS — E785 Hyperlipidemia, unspecified: Secondary | ICD-10-CM | POA: Diagnosis not present

## 2021-12-04 DIAGNOSIS — E119 Type 2 diabetes mellitus without complications: Secondary | ICD-10-CM | POA: Diagnosis not present

## 2021-12-04 DIAGNOSIS — M069 Rheumatoid arthritis, unspecified: Secondary | ICD-10-CM | POA: Diagnosis not present

## 2021-12-04 DIAGNOSIS — I5030 Unspecified diastolic (congestive) heart failure: Secondary | ICD-10-CM | POA: Diagnosis not present

## 2021-12-04 DIAGNOSIS — I10 Essential (primary) hypertension: Secondary | ICD-10-CM | POA: Diagnosis not present

## 2021-12-09 DIAGNOSIS — Z86711 Personal history of pulmonary embolism: Secondary | ICD-10-CM | POA: Insufficient documentation

## 2021-12-09 DIAGNOSIS — I700 Atherosclerosis of aorta: Secondary | ICD-10-CM | POA: Insufficient documentation

## 2021-12-09 DIAGNOSIS — E7800 Pure hypercholesterolemia, unspecified: Secondary | ICD-10-CM | POA: Insufficient documentation

## 2021-12-09 NOTE — Progress Notes (Addendum)
Cardiology Office Note:    Date:  12/10/2021   ID:  William Hunter, DOB 1946/02/18, MRN [id number]  PCP:  Irena Reichmann, DO  [facility] Providers Cardiologist:  Donato Schultz, MD     Referring MD: Irena Reichmann, DO   Chief Complaint:  F/u for coronary vasospasm    Patient Profile: Coronary artery disease w vasospasm Hx of MI Cath in 1981: no CAD Rheumatoid arthritis  Hyperlipidemia  Intol to statins Hypertension  Diabetes mellitus  Hx of DVT  Recurrent DVT/pulmonary embolism 11/2021 Aortic atherosclerosis   Prior CV Studies: Echocardiogram 11/27/2021 EF 60-65, no RWMA, Gr 1 DD, normal RVSF, mild dilation of ascending aorta (40 mm)  Myoview 03/10/12 EF 54, no ischemia  History of Present Illness:   William Hunter is a 76 y.o. male with the above problem list.  He was last seen by Dr. Anne Fu in March 2022.   He was admitted 11/24/21-11/29/21 with worsening lower ext weakness.  He was evaluated by neurology with recommendations for OP EMG/NCS. He was incidentally noted to have L lower ext DVT and pulmonary embolism in R lower pulmonary artery on CT.  He was placed on Eliquis.  He was DC to SNF.   He returns for f/u.  He is here with his wife.  He is currently staying at [facility].  He is supposed to see Neurology but does not have an appt until the end of August. He feels he is getting somewhat stronger.  He has not had chest pain, significant shortness of breath, syncope, orthopnea, leg edema.        Past Medical History:  Diagnosis Date   Anginal pain ([name]    Arthritis    "fingers" (03/09/2012)   Cold feet    "from my diabetes; they stay cold" (03/09/2012)   Coronary artery disease    Coronary artery spasm [facility])    since age 89 Dr Aleen Campi   COVID 07/06/2019   hospitalizied   Dyslipidemia    Femoral DVT (deep venous thrombosis) ([name]    left   Headache(784.0)    Hx of gallstones    Dr Derrell Lolling lap cholecystectomy   Hx of plastic surgery    plastic  surgery following a fall off a loading dock ,lost  all his upper teeth    Hypertension    MI, old    at age 31   Migraines    Pneumonia ~ 1962; 1970's   "double"; "regular" (03/09/2012)   Rheumatoid arthritis ([name]    Dr Dierdre Forth    Slipped cervical disc 06/10/2006   Dr Patsi Sears in the past/ Dr Larita Fife , chiropractor in 2008   Thyroid disease    Dr Lucianne Muss   Type II diabetes mellitus ([name]    Current Medications: Current Meds  Medication Sig   apixaban (ELIQUIS) 5 MG TABS tablet Take 10 mg p.o. twice daily, on  Wed 12/05/21 at 1000-switch to 5 mg p.o. twice daily.   folic acid (FOLVITE) 1 MG tablet Take 1 mg by mouth daily.   HUMIRA PEN 40 MG/0.4ML PNKT Inject 0.4 mLs into the muscle every 14 (fourteen) days.   insulin aspart (NOVOLOG) 100 UNIT/ML injection 0-5 Units, Subcutaneous, Daily at bedtime: HS scale CBG < 70: implement hypoglycemia measures CBG 70 - 120: 0 units CBG 121 - 150: 0 units CBG 151 - 200: 0 units CBG 201 - 250: 2 units CBG 251 - 300: 3 units CBG 301 - 350: 4 units CBG 351 -  400: 5 units CBG > 400: call MD and obtain STAT lab verification   insulin aspart (NOVOLOG) 100 UNIT/ML injection 0-15 Units, Subcutaneous, 3 times daily with meals CBG < 70: Implement Hypoglycemia Standing Orders and refer to Hypoglycemia Standing Orders sidebar report CBG 70 - 120: 0 units CBG 121 - 150: 2 units CBG 151 - 200: 3 units CBG 201 - 250: 5 units CBG 251 - 300: 8 units CBG 301 - 350: 11 units CBG 351 - 400: 15 units CBG > 400: call MD and obtain STAT lab verification   insulin glargine-yfgn (SEMGLEE) 100 UNIT/ML injection Inject 0.4 mLs (40 Units total) into the skin at bedtime.   loratadine (CLARITIN) 10 MG tablet Take 10 mg by mouth daily as needed for allergies.   methotrexate (RHEUMATREX) 2.5 MG tablet Take 15 mg by mouth every Wednesday. Caution:Chemotherapy. Protect from light.   nystatin (MYCOSTATIN/NYSTOP) powder Apply topically 2 (two) times daily. Apply to affected area.    ondansetron (ZOFRAN) 4 MG tablet Take 1 tablet (4 mg total) by mouth daily as needed for nausea or vomiting.   polyethylene glycol (MIRALAX / GLYCOLAX) 17 g packet Take 17 g by mouth daily.   senna-docusate (SENOKOT-S) 8.6-50 MG tablet Take 1 tablet by mouth at bedtime.   Study - ORION 4 - inclisiran 300 mg/1.29mL or placebo SQ injection (PI-Stuckey) Inject 1.5 mLs (300 mg total) into the skin every 6 (six) months.   [DISCONTINUED] diltiazem (TIAZAC) 300 MG 24 hr capsule Take 1 capsule (300 mg total) by mouth daily. Pt. Needs to schedule overdue appt. With Dr. Anne Fu in order to receive future refills. Thank You. 2nd Attempt.   Current Facility-Administered Medications for the 12/10/21 encounter (Office Visit) with Beatrice Lecher, PA-C  Medication   Study - ORION 4 - inclisiran 300 mg/1.49mL or placebo SQ injection (PI-Stuckey)    Allergies:   Codeine, Penicillins, Prednisone, Wool alcohol [lanolin], Empagliflozin, Penicillin g, and Statins   Social History   Tobacco Use   Smoking status: Never   Smokeless tobacco: Never  Vaping Use   Vaping Use: Never used  Substance Use Topics   Alcohol use: No   Drug use: No    Family Hx: The patient's family history includes Cancer - Other in his mother and sister; Heart attack in his sister; Heart disease in his father; Heart failure in his mother.  Review of Systems  Musculoskeletal:  Positive for joint swelling.  Gastrointestinal:  Negative for hematochezia.  Genitourinary:  Negative for hematuria.     EKGs/Labs/Other Test Reviewed:    EKG:  EKG is not ordered today.  The ekg ordered today demonstrates n/a  Recent Labs: 11/24/2021: ALT 15; Magnesium 1.7 11/28/2021: BUN 15; Creatinine, Ser 0.75; Hemoglobin 16.0; Platelets 268; Potassium 3.5; Sodium 133   Recent Lipid Panel No results for input(s): "CHOL", "TRIG", "HDL", "VLDL", "LDLCALC", "LDLDIRECT" in the last 8760 hours.   Risk Assessment/Calculations/Metrics:              Physical  Exam:    VS:  BP 120/60   Pulse 97   Ht 5\' 11"  (1.803 m)   SpO2 97%   BMI 25.31 kg/m     Wt Readings from Last 3 Encounters:  11/26/21 181 lb 7 oz (82.3 kg)  05/30/21 187 lb 3.2 oz (84.9 kg)  05/14/21 175 lb (79.4 kg)    Constitutional:      Appearance: Healthy appearance. Not in distress.  Pulmonary:     Effort: Pulmonary effort  is normal.     Breath sounds: No wheezing. No rales.  Cardiovascular:     Normal rate. Regular rhythm. Normal S1. Normal S2.      Murmurs: There is no murmur.  Edema:    Peripheral edema absent.  Abdominal:     General: There is no distension.     Palpations: Abdomen is soft.  Musculoskeletal:     Cervical back: Neck supple. Skin:    General: Skin is warm and dry.  Neurological:     General: No focal deficit present.     Mental Status: Alert and oriented to person, place and time.         ASSESSMENT & PLAN:   Coronary vasospasm ([name] He is overall doing well without anginal symptoms.  He is toleration his current dose of Diltiazem.  Continue current Rx.  F/u 1 year.   Hypertension The patient's blood pressure is controlled on his current regimen.  Continue current therapy.    Aortic atherosclerosis ([name] He is not on ASA as he is on Eliquis.  He is enrolled in the ORION-4 Trial for cholesterol.   History of pulmonary embolism Management of anticoagulation per PCP.  Pure hypercholesterolemia He remains in the ORION-4 Trial.           Dispo:  Return in about 1 year (around 12/11/2022) for Routine Follow Up w/ Dr. Anne Fu.   Medication Adjustments/Labs and Tests Ordered: Current medicines are reviewed at length with the patient today.  Concerns regarding medicines are outlined above.  Tests Ordered: No orders of the defined types were placed in this encounter.  Medication Changes: Meds ordered this encounter  Medications   diltiazem (TIAZAC) 300 MG 24 hr capsule    Sig: Take 1 capsule (300 mg total) by mouth daily.    Dispense:  90  capsule    Refill:  3   Signed, Tereso Newcomer, PA-C  12/10/2021 11:42 AM    [facility] [address] [place], [place], [place]  [address] Phone: [phone]; Fax: [phone]

## 2021-12-10 ENCOUNTER — Encounter: Payer: Self-pay | Admitting: Physician Assistant

## 2021-12-10 ENCOUNTER — Ambulatory Visit: Payer: Medicare HMO | Admitting: Physician Assistant

## 2021-12-10 VITALS — BP 120/60 | HR 97 | Ht 71.0 in

## 2021-12-10 DIAGNOSIS — Z86711 Personal history of pulmonary embolism: Secondary | ICD-10-CM | POA: Diagnosis not present

## 2021-12-10 DIAGNOSIS — I10 Essential (primary) hypertension: Secondary | ICD-10-CM | POA: Diagnosis not present

## 2021-12-10 DIAGNOSIS — I700 Atherosclerosis of aorta: Secondary | ICD-10-CM | POA: Diagnosis not present

## 2021-12-10 DIAGNOSIS — E7800 Pure hypercholesterolemia, unspecified: Secondary | ICD-10-CM | POA: Diagnosis not present

## 2021-12-10 DIAGNOSIS — I201 Angina pectoris with documented spasm: Secondary | ICD-10-CM

## 2021-12-10 MED ORDER — DILTIAZEM HCL ER BEADS 300 MG PO CP24: 300.0000 mg | ORAL_CAPSULE | Freq: Every day | ORAL | 3 refills | Status: DC

## 2021-12-10 NOTE — Assessment & Plan Note (Signed)
He remains in the ORION-4 Trial.

## 2021-12-10 NOTE — Assessment & Plan Note (Signed)
He is overall doing well without anginal symptoms.  He is toleration his current dose of Diltiazem.  Continue current Rx.  F/u 1 year.

## 2021-12-10 NOTE — Assessment & Plan Note (Signed)
He is not on ASA as he is on Eliquis.  He is enrolled in the ORION-4 Trial for cholesterol.

## 2021-12-10 NOTE — Assessment & Plan Note (Signed)
The patient's blood pressure is controlled on his current regimen.  Continue current therapy.  

## 2021-12-10 NOTE — Assessment & Plan Note (Signed)
Management of anticoagulation per PCP.

## 2021-12-10 NOTE — Patient Instructions (Signed)
Medication Instructions:  Your physician recommends that you continue on your current medications as directed. Please refer to the Current Medication list given to you today.  *If you need a refill on your cardiac medications before your next appointment, please call your pharmacy*   Lab Work: None ordered  If you have labs (blood work) drawn today and your tests are completely normal, you will receive your results only by: MyChart Message (if you have MyChart) OR A paper copy in the mail If you have any lab test that is abnormal or we need to change your treatment, we will call you to review the results.   Testing/Procedures: None ordered   Follow-Up: At [facility], you and your health needs are our priority.  As part of our continuing mission to provide you with exceptional heart care, we have created designated Provider Care Teams.  These Care Teams include your primary Cardiologist (physician) and Advanced Practice Providers (APPs -  Physician Assistants and Nurse Practitioners) who all work together to provide you with the care you need, when you need it.  We recommend signing up for the patient portal called "MyChart".  Sign up information is provided on this After Visit Summary.  MyChart is used to connect with patients for Virtual Visits (Telemedicine).  Patients are able to view lab/test results, encounter notes, upcoming appointments, etc.  Non-urgent messages can be sent to your provider as well.   To learn more about what you can do with MyChart, go to [email or web address].    Your next appointment:   12 month(s)  The format for your next appointment:   In Person  Provider:   Mark Skains, MD    Other Instructions   Important Information About Sugar       

## 2021-12-11 DIAGNOSIS — F4323 Adjustment disorder with mixed anxiety and depressed mood: Secondary | ICD-10-CM | POA: Diagnosis not present

## 2021-12-12 ENCOUNTER — Other Ambulatory Visit: Payer: Self-pay

## 2021-12-12 DIAGNOSIS — E119 Type 2 diabetes mellitus without complications: Secondary | ICD-10-CM | POA: Diagnosis not present

## 2021-12-12 DIAGNOSIS — M6281 Muscle weakness (generalized): Secondary | ICD-10-CM | POA: Diagnosis not present

## 2021-12-12 DIAGNOSIS — I10 Essential (primary) hypertension: Secondary | ICD-10-CM | POA: Diagnosis not present

## 2021-12-12 DIAGNOSIS — I5030 Unspecified diastolic (congestive) heart failure: Secondary | ICD-10-CM | POA: Diagnosis not present

## 2021-12-12 MED ORDER — DILTIAZEM HCL ER BEADS 300 MG PO CP24: 300.0000 mg | ORAL_CAPSULE | Freq: Every day | ORAL | 3 refills | Status: DC

## 2021-12-13 DIAGNOSIS — L89621 Pressure ulcer of left heel, stage 1: Secondary | ICD-10-CM | POA: Diagnosis not present

## 2021-12-18 DIAGNOSIS — I10 Essential (primary) hypertension: Secondary | ICD-10-CM | POA: Diagnosis not present

## 2021-12-18 DIAGNOSIS — M6281 Muscle weakness (generalized): Secondary | ICD-10-CM | POA: Diagnosis not present

## 2021-12-18 DIAGNOSIS — R110 Nausea: Secondary | ICD-10-CM | POA: Diagnosis not present

## 2021-12-18 DIAGNOSIS — I5030 Unspecified diastolic (congestive) heart failure: Secondary | ICD-10-CM | POA: Diagnosis not present

## 2021-12-20 DIAGNOSIS — L97419 Non-pressure chronic ulcer of right heel and midfoot with unspecified severity: Secondary | ICD-10-CM | POA: Diagnosis not present

## 2021-12-20 DIAGNOSIS — R300 Dysuria: Secondary | ICD-10-CM | POA: Diagnosis not present

## 2021-12-20 DIAGNOSIS — I10 Essential (primary) hypertension: Secondary | ICD-10-CM | POA: Diagnosis not present

## 2021-12-20 DIAGNOSIS — E119 Type 2 diabetes mellitus without complications: Secondary | ICD-10-CM | POA: Diagnosis not present

## 2021-12-20 DIAGNOSIS — I5030 Unspecified diastolic (congestive) heart failure: Secondary | ICD-10-CM | POA: Diagnosis not present

## 2021-12-20 LAB — MISC LABCORP TEST (SEND OUT): Labcorp test code: 9985

## 2021-12-21 DIAGNOSIS — N390 Urinary tract infection, site not specified: Secondary | ICD-10-CM | POA: Diagnosis not present

## 2021-12-21 DIAGNOSIS — R29898 Other symptoms and signs involving the musculoskeletal system: Secondary | ICD-10-CM | POA: Diagnosis not present

## 2021-12-21 DIAGNOSIS — I82409 Acute embolism and thrombosis of unspecified deep veins of unspecified lower extremity: Secondary | ICD-10-CM | POA: Diagnosis not present

## 2021-12-21 DIAGNOSIS — I2699 Other pulmonary embolism without acute cor pulmonale: Secondary | ICD-10-CM | POA: Diagnosis not present

## 2021-12-21 DIAGNOSIS — I5030 Unspecified diastolic (congestive) heart failure: Secondary | ICD-10-CM | POA: Diagnosis not present

## 2021-12-21 DIAGNOSIS — M069 Rheumatoid arthritis, unspecified: Secondary | ICD-10-CM | POA: Diagnosis not present

## 2021-12-21 DIAGNOSIS — M6281 Muscle weakness (generalized): Secondary | ICD-10-CM | POA: Diagnosis not present

## 2021-12-21 DIAGNOSIS — I10 Essential (primary) hypertension: Secondary | ICD-10-CM | POA: Diagnosis not present

## 2021-12-25 DIAGNOSIS — R110 Nausea: Secondary | ICD-10-CM | POA: Diagnosis not present

## 2021-12-25 DIAGNOSIS — I5030 Unspecified diastolic (congestive) heart failure: Secondary | ICD-10-CM | POA: Diagnosis not present

## 2021-12-25 DIAGNOSIS — N390 Urinary tract infection, site not specified: Secondary | ICD-10-CM | POA: Diagnosis not present

## 2021-12-25 DIAGNOSIS — I10 Essential (primary) hypertension: Secondary | ICD-10-CM | POA: Diagnosis not present

## 2021-12-25 DIAGNOSIS — E119 Type 2 diabetes mellitus without complications: Secondary | ICD-10-CM | POA: Diagnosis not present

## 2021-12-27 DIAGNOSIS — L89620 Pressure ulcer of left heel, unstageable: Secondary | ICD-10-CM | POA: Diagnosis not present

## 2022-01-03 DIAGNOSIS — R29898 Other symptoms and signs involving the musculoskeletal system: Secondary | ICD-10-CM | POA: Diagnosis not present

## 2022-01-03 DIAGNOSIS — R110 Nausea: Secondary | ICD-10-CM | POA: Diagnosis not present

## 2022-01-08 DIAGNOSIS — F4323 Adjustment disorder with mixed anxiety and depressed mood: Secondary | ICD-10-CM | POA: Diagnosis not present

## 2022-01-10 DIAGNOSIS — L89620 Pressure ulcer of left heel, unstageable: Secondary | ICD-10-CM | POA: Diagnosis not present

## 2022-01-15 DIAGNOSIS — I5030 Unspecified diastolic (congestive) heart failure: Secondary | ICD-10-CM | POA: Diagnosis not present

## 2022-01-15 DIAGNOSIS — I10 Essential (primary) hypertension: Secondary | ICD-10-CM | POA: Diagnosis not present

## 2022-01-15 DIAGNOSIS — M069 Rheumatoid arthritis, unspecified: Secondary | ICD-10-CM | POA: Diagnosis not present

## 2022-01-17 DIAGNOSIS — L89620 Pressure ulcer of left heel, unstageable: Secondary | ICD-10-CM | POA: Diagnosis not present

## 2022-01-18 DIAGNOSIS — I10 Essential (primary) hypertension: Secondary | ICD-10-CM | POA: Diagnosis not present

## 2022-01-18 DIAGNOSIS — R29898 Other symptoms and signs involving the musculoskeletal system: Secondary | ICD-10-CM | POA: Diagnosis not present

## 2022-01-18 DIAGNOSIS — I2699 Other pulmonary embolism without acute cor pulmonale: Secondary | ICD-10-CM | POA: Diagnosis not present

## 2022-01-18 DIAGNOSIS — R110 Nausea: Secondary | ICD-10-CM | POA: Diagnosis not present

## 2022-01-18 DIAGNOSIS — I82409 Acute embolism and thrombosis of unspecified deep veins of unspecified lower extremity: Secondary | ICD-10-CM | POA: Diagnosis not present

## 2022-01-18 DIAGNOSIS — I5030 Unspecified diastolic (congestive) heart failure: Secondary | ICD-10-CM | POA: Diagnosis not present

## 2022-01-28 DIAGNOSIS — M170 Bilateral primary osteoarthritis of knee: Secondary | ICD-10-CM | POA: Diagnosis not present

## 2022-01-28 DIAGNOSIS — Z006 Encounter for examination for normal comparison and control in clinical research program: Secondary | ICD-10-CM

## 2022-01-28 DIAGNOSIS — Z79899 Other long term (current) drug therapy: Secondary | ICD-10-CM | POA: Diagnosis not present

## 2022-01-28 DIAGNOSIS — M0609 Rheumatoid arthritis without rheumatoid factor, multiple sites: Secondary | ICD-10-CM | POA: Diagnosis not present

## 2022-01-28 NOTE — Research (Signed)
Called patient to remind him of his appointment for ORION 4 trial on 01/29/22 and had to leave a message. His spouse called back and stated that he is in rehab and has been there since 11/29/21. She asked that we try to call back in a month to see if he has come home at that time. His spouse feels like he might be there awhile since he is still weak.

## 2022-01-29 DIAGNOSIS — I5030 Unspecified diastolic (congestive) heart failure: Secondary | ICD-10-CM | POA: Diagnosis not present

## 2022-01-29 DIAGNOSIS — M069 Rheumatoid arthritis, unspecified: Secondary | ICD-10-CM | POA: Diagnosis not present

## 2022-01-29 DIAGNOSIS — I10 Essential (primary) hypertension: Secondary | ICD-10-CM | POA: Diagnosis not present

## 2022-01-29 DIAGNOSIS — M6281 Muscle weakness (generalized): Secondary | ICD-10-CM | POA: Diagnosis not present

## 2022-01-31 ENCOUNTER — Ambulatory Visit: Payer: Medicare HMO | Admitting: Neurology

## 2022-01-31 ENCOUNTER — Encounter: Payer: Self-pay | Admitting: Neurology

## 2022-01-31 ENCOUNTER — Other Ambulatory Visit: Payer: Self-pay | Admitting: Neurology

## 2022-01-31 VITALS — BP 106/74 | HR 103 | Ht 70.0 in

## 2022-01-31 DIAGNOSIS — L89620 Pressure ulcer of left heel, unstageable: Secondary | ICD-10-CM | POA: Diagnosis not present

## 2022-01-31 DIAGNOSIS — R269 Unspecified abnormalities of gait and mobility: Secondary | ICD-10-CM

## 2022-01-31 NOTE — Progress Notes (Signed)
Chief Complaint  Patient presents with   New Patient (Initial Visit)    Rm 14. Accompanied by wife and daughter. NX Dr. Penumalli-l/s 2018/needs to be scheduled w/ Dr. Krista Blue to eval for possible EMG/internal referral for weakness of both lower extremities.      ASSESSMENT AND PLAN  William Hunter is a 76 y.o. male   Weakness, Gait abnormality  This happened in the setting of long-term sedentary lifestyle, deconditioning, chronic pain, rheumatoid arthritis, poorly controlled diabetes,   There is evidence of length-dependent sensory changes, most suggestive of diabetic peripheral neuropathy  Thyroid functional test  EMG nerve conduction study  Continue rehabilitation   DIAGNOSTIC DATA (LABS, IMAGING, TESTING) - I reviewed patient records, labs, notes, testing and imaging myself where available.   MEDICAL HISTORY:  William Hunter, is a 76 year old male, seen in request by his [facility] care doctor   [name] for evaluation of weakness, he is accompanied by his wife and daughter at today's clinical visit January 31, 2022  I reviewed and summarized the referring note. PMHX DVT, PE, on Eliquis RA--under the care of Rheumatologist Dr. Tana Coast at [facility] HTN DM CAD HLD Kyphoplasty in Dec 2022.   He had hospital admission in June 2023, for progressive lower extremity weakness, gait abnormalities, to the point that he was confined to recliner prior to hospital admission,  He had extensive evaluation, I personally reviewed MRI of the brain with without contrast, chronic disproportional ventriculomegaly, moderate small vessel disease, I also reviewed previous multiple CT head, he was noted to have ventriculomegaly as far back in 2006  MRI of cervical spine, mild degenerative changes no evidence of cervical compression  MRI of thoracic spine chronic compression deformity of T5, status post kyphoplasty, no cord compression  MRI of the lumbar spine mild degenerative changes  no significant canal foraminal narrowing  Extensive laboratory evaluation: Normal hemoglobin 15.8, C-reactive protein, ESR, HIV, B6, folic acid, hepatitis panel, vitamin D, protein electrophoresis, B1, B12, CPK, lipase, magnesium 1.7, CMP normal creatinine, elevated glucose 215, 1C was 9.7  Patient used to work as a Secondary school teacher, 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23, when he was walking, he needs to stand up, driving, talk with people, was functioning okay, since retirement he become very sedentary, especially after he suffered COVID infection in January 2021, acute respiratory failure due to COVID-pneumonia requiring hospitalization,  He was found to have acute DVT at the left lower extremity, put on Eliquis treatment,  Since that hospital admission, he lied in bed in couple weeks could not even get up to her wife, then become more sedentary, spent most of the time in a sitting position watching TV, also suffered significant rheumatoid arthritis, multiple joints pain, especially knee pain, worry about fall injury.  He had few years of bilateral feet numbness mainly involving plantar surface and toes, denies bowel or bladder incontinence, chronic low back pain, compression fracture of T5 require kyphoplasty December 2022, over the past 6 months he has lost significant weight  With rehabilitation, he made some progress, he can ambulate with his walker now, still complains of generalized weakness  Family also noticed since June admission, he had a tendency to flex his neck,  PHYSICAL EXAM:   Vitals:   01/31/22 1602  BP: 106/74  Pulse: (!) 103  Height: '5\' 10"'  (1.778 m)     Body mass index is 26.03 kg/m.  PHYSICAL EXAMNIATION:  Gen: NAD, conversant, well nourised, well groomed  Cardiovascular: Regular rate rhythm, no peripheral edema, warm, nontender. Eyes: Conjunctivae clear without exudates or hemorrhage Neck: Tendency for neck flexion, maximum erection did not pass coronal  plane Pulmonary: Clear to auscultation bilaterally   NEUROLOGICAL EXAM:  MENTAL STATUS: Speech/cognition: Awake, alert, oriented to history taking and casual conversation CRANIAL NERVES: CN II: Visual fields are full to confrontation. Pupils are round equal and briskly reactive to light. CN III, IV, VI: extraocular movement are normal. No ptosis. CN V: Facial sensation is intact to light touch CN VII: Face is symmetric with normal eye closure  CN VIII: Hearing is normal to causal conversation. CN IX, X: Phonation is normal. CN XI: Head turning and shoulder shrug are intact  MOTOR: Generalized weakness, deformity of bilateral toes, only has mild bilateral toe flexion extension weakness  REFLEXES: Areflexia  SENSORY: Vibratory sensation to ankle level, preserved toe and fingertip proprioception, length-dependent decreased light touch pinprick to mid spine level, in the mid forearm level  COORDINATION: There is no trunk or limb dysmetria noted.  GAIT/STANCE: Deferred  REVIEW OF SYSTEMS:  Full 14 system review of systems performed and notable only for as above All other review of systems were negative.   ALLERGIES: Allergies  Allergen Reactions   Codeine Swelling    Other reaction(s): rash   Penicillins Hives and Swelling    Has patient had a PCN reaction causing immediate rash, facial/tongue/throat swelling, SOB or lightheadedness with hypotension: Yes Has patient had a PCN reaction causing severe rash involving mucus membranes or skin necrosis: No Has patient had a PCN reaction that required hospitalization No Has patient had a PCN reaction occurring within the last 10 years: No If all of the above answers are "NO", then may proceed with Cephalosporin use.    Prednisone Hives    Other reaction(s): hives   Wool Alcohol [Lanolin] Hives   Empagliflozin Nausea And Vomiting    Other reaction(s): nausea and vomiting   Penicillin G     Other reaction(s): rash   Statins      Other reaction(s): myopathy    HOME MEDICATIONS: Current Outpatient Medications  Medication Sig Dispense Refill   apixaban (ELIQUIS) 5 MG TABS tablet Take 10 mg p.o. twice daily, on  Wed 12/05/21 at 1000-switch to 5 mg p.o. twice daily. 60 tablet    diltiazem (TIAZAC) 300 MG 24 hr capsule Take 1 capsule (300 mg total) by mouth daily. 90 capsule 3   folic acid (FOLVITE) 1 MG tablet Take 1 mg by mouth daily.     HUMIRA PEN 40 MG/0.4ML PNKT Inject 0.4 mLs into the muscle every 14 (fourteen) days.     hydroxychloroquine (PLAQUENIL) 200 MG tablet Take 200 mg by mouth daily.     ibuprofen (ADVIL) 200 MG tablet Take 200 mg by mouth daily.     insulin aspart (NOVOLOG) 100 UNIT/ML injection 0-5 Units, Subcutaneous, Daily at bedtime: HS scale CBG < 70: implement hypoglycemia measures CBG 70 - 120: 0 units CBG 121 - 150: 0 units CBG 151 - 200: 0 units CBG 201 - 250: 2 units CBG 251 - 300: 3 units CBG 301 - 350: 4 units CBG 351 - 400: 5 units CBG > 400: call MD and obtain STAT lab verification 10 mL 11   insulin aspart (NOVOLOG) 100 UNIT/ML injection 0-15 Units, Subcutaneous, 3 times daily with meals CBG < 70: Implement Hypoglycemia Standing Orders and refer to Hypoglycemia Standing Orders sidebar report CBG 70 - 120: 0 units CBG 121 - 150:  2 units CBG 151 - 200: 3 units CBG 201 - 250: 5 units CBG 251 - 300: 8 units CBG 301 - 350: 11 units CBG 351 - 400: 15 units CBG > 400: call MD and obtain STAT lab verification 10 mL 11   meclizine (ANTIVERT) 25 MG tablet Take 25 mg by mouth 3 (three) times daily as needed for dizziness.     methotrexate (RHEUMATREX) 2.5 MG tablet Take 6 tablets by mouth every Wednesday. Caution:Chemotherapy. Protect from light.     Study - ORION 4 - inclisiran 300 mg/1.28m or placebo SQ injection (PI-Stuckey) Inject 1.5 mLs (300 mg total) into the skin every 6 (six) months. 1 mL 1   Current Facility-Administered Medications  Medication Dose Route Frequency Provider Last Rate Last Admin    Study - ORION 4 - inclisiran 300 mg/1.556mor placebo SQ injection (PI-Stuckey)  300 mg Subcutaneous Q6 months StHillary BowMD        PAST MEDICAL HISTORY: Past Medical History:  Diagnosis Date   Anginal pain (HCCove   Arthritis    "fingers" (03/09/2012)   Cold feet    "from my diabetes; they stay cold" (03/09/2012)   Coronary artery disease    [facility]   since age [id number] [place] COVID 07/06/2019   hospitalizied   Dyslipidemia    Femoral DVT (deep venous thrombosis) ([name]    left   Headache(784.0)    Hx of gallstones    Dr InDalbert Batmanap cholecystectomy   Hx of plastic surgery    plastic surgery following a fall off a loading dock ,lost  all his upper teeth    Hypertension    MI, old    at age 76 Migraines    Pneumonia ~ 1962; 1970's   "double"; "regular" (03/09/2012)   [place]   Dr BeAmil Amen  Slipped cervical disc 06/10/2006   Dr TaGaynelle Arabiann the past/ Dr SpRoderic Scarce chiropractor in 2008   Thyroid disease    Dr kuDwyane Dee Type II diabetes mellitus ([facility]    PAST SURGICAL HISTORY: Past Surgical History:  Procedure Laterality Date   CARDIAC CATHETERIZATION     x 3 with NO stents    CHOLECYSTECTOMY  03/11/2012   Procedure: LAPAROSCOPIC CHOLECYSTECTOMY WITH INTRAOPERATIVE CHOLANGIOGRAM;  Surgeon: HaAdin HectorMD;  [facility]: General;  Laterality: N/A;   KYPHOPLASTY N/A 05/30/2021   Procedure: THORACIC FIVE KYPHOPLASTY;  Surgeon: CaAshok PallMD;  [facility] Neurosurgery;  Laterality: N/A;  THORACIC FIVE KYPHOPLASTY   PENILE PROSTHESIS IMPLANT  1983    FAMILY HISTORY: Family History  Problem Relation Age of Onset   Heart failure Mother    Cancer - Other Mother        breast   Heart disease Father    Cancer - Other Sister        tongue cancer   Heart attack Sister     SOCIAL HISTORY: Social History   Socioeconomic History   Marital status: Married    Spouse name: ShEnid Derry Number  of children: 0   Years of education: 14   Highest education level: Not on file  Occupational History    Comment: [employer]  Tobacco Use   Smoking status: Never   Smokeless tobacco: Never  Vaping Use   Vaping Use: Never used  Substance and Sexual Activity   Alcohol use: No  Drug use: No   Sexual activity: Yes  Other Topics Concern   Not on file  Social History Narrative   Lives with wife   Caffeine- Diet Coke 5 daily   Social Determinants of Health   Financial Resource Strain: Not on file  Food Insecurity: Not on file  Transportation Needs: Not on file  Physical Activity: Not on file  Stress: Not on file  Social Connections: Not on file  Intimate Partner Violence: Not on file      Marcial Pacas, M.D. Ph.D.  [facility] [address], [place], [place] [address] Ph: [phone] Fax: [phone]  CC:  Derek Jack, MD [address] [place],   [address]  [name], DO

## 2022-02-01 LAB — THYROID PANEL WITH TSH
Free Thyroxine Index: 3.6 (ref 1.2–4.9)
T3 Uptake Ratio: 32 % (ref 24–39)
T4, Total: 11.2 ug/dL (ref 4.5–12.0)
TSH: 0.007 u[IU]/mL — ABNORMAL LOW (ref 0.450–4.500)

## 2022-02-05 ENCOUNTER — Encounter (INDEPENDENT_AMBULATORY_CARE_PROVIDER_SITE_OTHER): Payer: Medicare HMO | Admitting: Neurology

## 2022-02-05 DIAGNOSIS — R269 Unspecified abnormalities of gait and mobility: Secondary | ICD-10-CM | POA: Diagnosis not present

## 2022-02-05 DIAGNOSIS — F4323 Adjustment disorder with mixed anxiety and depressed mood: Secondary | ICD-10-CM | POA: Diagnosis not present

## 2022-02-05 DIAGNOSIS — R29898 Other symptoms and signs involving the musculoskeletal system: Secondary | ICD-10-CM

## 2022-02-05 DIAGNOSIS — F5102 Adjustment insomnia: Secondary | ICD-10-CM | POA: Diagnosis not present

## 2022-02-05 DIAGNOSIS — E1142 Type 2 diabetes mellitus with diabetic polyneuropathy: Secondary | ICD-10-CM

## 2022-02-06 DIAGNOSIS — M6281 Muscle weakness (generalized): Secondary | ICD-10-CM | POA: Diagnosis not present

## 2022-02-06 DIAGNOSIS — H1032 Unspecified acute conjunctivitis, left eye: Secondary | ICD-10-CM | POA: Diagnosis not present

## 2022-02-06 DIAGNOSIS — I10 Essential (primary) hypertension: Secondary | ICD-10-CM | POA: Diagnosis not present

## 2022-02-06 DIAGNOSIS — I5030 Unspecified diastolic (congestive) heart failure: Secondary | ICD-10-CM | POA: Diagnosis not present

## 2022-02-07 DIAGNOSIS — L89620 Pressure ulcer of left heel, unstageable: Secondary | ICD-10-CM | POA: Diagnosis not present

## 2022-02-08 NOTE — Telephone Encounter (Signed)
  Thank you for your message seeking medical advice.* My assessment and recommendation are as follows: William Hunter:  The enlarged ventricle " fluid on the brain" has been present since 2006 by reviewing all his previous brain scan. It is a chronic condition.   Sometimes, we offer surgery for patient to drain the fluid, but I do not think he needs it. The enlarged ventricle is a chronic condition. I do not think the surgical drain would provide benefit.  I am sorry we have such long waiting list for EMG/NCS, which is essentially an extension of neurological exam, to better understand the potential cause and degree of his neuropathy and weakness.  We already has a lot to work on based on current information. He has evidence of diabetic peripheral neuropathy, generalized weakness due to deconditioning, chronic pain, multiple joints pain from his rheumatoid arthritis, poorly controlled diabetes, and his long-term sedentary lifestyle with acute illness.  By reviewing the history and exam from last visit, he has slowly declined over the past few years, with acute worsening since this hospital admission in June 2023  We will try to put him on waiting list for EMG nerve conduction study, if possible, move up his study, but this will not change the fact that he is still dealing with the weakness, gait abnormality.  The best way to make him more functional is to continue exercise, better control of diabetes, gradually increase his mobility.  These all take times.  Sincerely,  William Feinstein, MD Ph.D.   *This exchange required the expertise of a doctor, nurse practitioner, physician assistant, optometrist or certified nurse midwife and qualifies as a Medical Advice Message, please visit [email or web address] for more details.  will bill your insurance on your behalf; copays and deductibles may apply. Questions? Reply to this message.

## 2022-02-12 DIAGNOSIS — I5030 Unspecified diastolic (congestive) heart failure: Secondary | ICD-10-CM | POA: Diagnosis not present

## 2022-02-12 DIAGNOSIS — I10 Essential (primary) hypertension: Secondary | ICD-10-CM | POA: Diagnosis not present

## 2022-02-12 DIAGNOSIS — H1032 Unspecified acute conjunctivitis, left eye: Secondary | ICD-10-CM | POA: Diagnosis not present

## 2022-02-12 DIAGNOSIS — M069 Rheumatoid arthritis, unspecified: Secondary | ICD-10-CM | POA: Diagnosis not present

## 2022-02-14 DIAGNOSIS — L89620 Pressure ulcer of left heel, unstageable: Secondary | ICD-10-CM | POA: Diagnosis not present

## 2022-02-21 DIAGNOSIS — L89620 Pressure ulcer of left heel, unstageable: Secondary | ICD-10-CM | POA: Diagnosis not present

## 2022-02-26 ENCOUNTER — Telehealth: Payer: Self-pay | Admitting: Neurology

## 2022-02-26 NOTE — Telephone Encounter (Signed)
Pt's wife called wanting to know if the provider can send her some information regarding the pt's dx of fluid in the brain. She said she would not mind if the provider called her or mailed information to her but she is needing to know what to look forward to in the future with the pt. Please advise.

## 2022-03-14 DIAGNOSIS — M0609 Rheumatoid arthritis without rheumatoid factor, multiple sites: Secondary | ICD-10-CM | POA: Diagnosis not present

## 2022-03-14 DIAGNOSIS — I2699 Other pulmonary embolism without acute cor pulmonale: Secondary | ICD-10-CM | POA: Diagnosis not present

## 2022-03-14 DIAGNOSIS — I82402 Acute embolism and thrombosis of unspecified deep veins of left lower extremity: Secondary | ICD-10-CM | POA: Diagnosis not present

## 2022-03-15 DIAGNOSIS — R531 Weakness: Secondary | ICD-10-CM | POA: Diagnosis not present

## 2022-03-15 DIAGNOSIS — I82402 Acute embolism and thrombosis of unspecified deep veins of left lower extremity: Secondary | ICD-10-CM | POA: Diagnosis not present

## 2022-03-15 DIAGNOSIS — M059 Rheumatoid arthritis with rheumatoid factor, unspecified: Secondary | ICD-10-CM | POA: Diagnosis not present

## 2022-03-15 DIAGNOSIS — Z23 Encounter for immunization: Secondary | ICD-10-CM | POA: Diagnosis not present

## 2022-03-15 DIAGNOSIS — Z09 Encounter for follow-up examination after completed treatment for conditions other than malignant neoplasm: Secondary | ICD-10-CM | POA: Diagnosis not present

## 2022-03-27 DIAGNOSIS — E118 Type 2 diabetes mellitus with unspecified complications: Secondary | ICD-10-CM | POA: Diagnosis not present

## 2022-03-27 DIAGNOSIS — E0590 Thyrotoxicosis, unspecified without thyrotoxic crisis or storm: Secondary | ICD-10-CM | POA: Diagnosis not present

## 2022-04-10 ENCOUNTER — Ambulatory Visit: Payer: Medicare HMO | Admitting: Neurology

## 2022-04-10 ENCOUNTER — Ambulatory Visit (INDEPENDENT_AMBULATORY_CARE_PROVIDER_SITE_OTHER): Payer: Medicare HMO | Admitting: Neurology

## 2022-04-10 VITALS — BP 120/83 | HR 94 | Ht 70.6 in | Wt 181.0 lb

## 2022-04-10 DIAGNOSIS — G629 Polyneuropathy, unspecified: Secondary | ICD-10-CM | POA: Diagnosis not present

## 2022-04-10 DIAGNOSIS — R9089 Other abnormal findings on diagnostic imaging of central nervous system: Secondary | ICD-10-CM | POA: Diagnosis not present

## 2022-04-10 DIAGNOSIS — R269 Unspecified abnormalities of gait and mobility: Secondary | ICD-10-CM | POA: Diagnosis not present

## 2022-04-10 DIAGNOSIS — E1142 Type 2 diabetes mellitus with diabetic polyneuropathy: Secondary | ICD-10-CM

## 2022-04-10 MED ORDER — CARBIDOPA-LEVODOPA 25-100 MG PO TABS: 1.0000 | ORAL_TABLET | Freq: Three times a day (TID) | ORAL | 6 refills | Status: AC

## 2022-04-10 NOTE — Progress Notes (Signed)
ASSESSMENT AND PLAN  William Hunter is a 76 y.o. male   Weakness, Gait abnormality  This happened in the setting of long-term sedentary lifestyle, deconditioning, chronic pain, rheumatoid arthritis, poorly controlled diabetes, A1c was 9.7,    EMG nerve conduction study confirmed severe length-dependent axonal sensorimotor polyneuropathy, with marked mixed demyelinating features, no evidence of intrinsic muscle disease    Thyroid functional test showed significantly suppressed TSH, but normal total T4, free thyroxine index, under supervision of his primary care physician,  Extensive evaluation in the past failed to demonstrate etiology other than poorly controlled diabetes,  Abnormal MRI of the brain  We again personally reviewed MRI of the brain in June 2023, significant ventriculomegaly, that is out of proportion to mild generalized atrophy, reviewed multiple previous scans, ventriculomegaly was present on previous CT scan  Repeat MRI of the brain early next year, follow-up visit after that  He has no typical history of rapid progressive dementia or incontinence gait abnormality, his weakness, sensory loss slow progressive, less likely normal pressure hydrocephalus  He has mild rigidity, bradykinesia, will give him a trial of Sinemet 25/100 mg    Return To Clinic With NP In 6 Months    DIAGNOSTIC DATA (LABS, IMAGING, TESTING) - I reviewed patient records, labs, notes, testing and imaging myself where available.   MEDICAL HISTORY:  William Hunter, is a 76 year old male, seen in request by his [facility] care doctor   [name] for evaluation of weakness, he is accompanied by his wife and daughter at today's clinical visit January 31, 2022  I reviewed and summarized the referring note. PMHX DVT, PE, on Eliquis RA--under the care of Rheumatologist Dr. Tana Coast at [facility] HTN DM CAD HLD Kyphoplasty in Dec 2022.   He had hospital admission in June 2023, for progressive lower  extremity weakness, gait abnormalities, to the point that he was confined to recliner prior to hospital admission,  He had extensive evaluation, I personally reviewed MRI of the brain with without contrast, chronic disproportional ventriculomegaly, moderate small vessel disease, I also reviewed previous multiple CT head, he was noted to have ventriculomegaly as far back in 2006  MRI of cervical spine, mild degenerative changes no evidence of cervical compression  MRI of thoracic spine chronic compression deformity of T5, status post kyphoplasty, no cord compression  MRI of the lumbar spine mild degenerative changes no significant canal foraminal narrowing  Extensive laboratory evaluation: Normal hemoglobin 15.8, C-reactive protein, ESR, HIV, B6, folic acid, hepatitis panel, vitamin D, protein electrophoresis, B1, B12, CPK, lipase, magnesium 1.7, CMP normal creatinine, elevated glucose 215, A1C was 9.7  Patient used to work as a Secondary school teacher, 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21, when he was walking, he needs to stand up, driving, talk with people, was functioning okay, since retirement he become very sedentary, especially after he suffered COVID infection in January 2021, acute respiratory failure due to COVID-pneumonia requiring hospitalization,  He was found to have acute DVT at the left lower extremity, put on Eliquis treatment,  Since that hospital admission, he lied in bed in couple weeks could not even get up to her wife, then become more sedentary, spent most of the time in a sitting position watching TV, also suffered significant rheumatoid arthritis, multiple joints pain, especially knee pain, worry about fall injury.  He had few years of bilateral feet numbness mainly involving plantar surface and toes, denies bowel or bladder incontinence, chronic low back pain, compression fracture of T5 require kyphoplasty December 2022, over  the past 6 months he has lost significant weight  With rehabilitation, he  made some progress, he can ambulate with his walker now, still complains of generalized weakness  Family also noticed since June admission, he had a tendency to flex his neck,  Update April 10, 2022: He return for electrodiagnostic study today which showed evidence of severe length-dependent axonal sensorimotor polyneuropathy, with some demyelinating features, in addition there is evidence of moderately severe right carpal tunnel syndromes.  There is no evidence of intrinsic muscle disease  He is now receiving home physical therapy, couple times each week, made significant improvement, walk with a walker today, from parking lot to the exam room, last visit he was in wheelchair    PHYSICAL EXAM:   120/83, heart rate of 94  PHYSICAL EXAMNIATION:  Gen: NAD, conversant, well nourised, well groomed                     Cardiovascular: Regular rate rhythm, no peripheral edema, warm, nontender. Eyes: Conjunctivae clear without exudates or hemorrhage Neck: Tendency for neck flexion, maximum erection did not pass coronal plane Pulmonary: Clear to auscultation bilaterally   NEUROLOGICAL EXAM:  MENTAL STATUS: Speech/cognition: Awake, alert, oriented to history taking and casual conversation CRANIAL NERVES: CN II: Visual fields are full to confrontation. Pupils are round equal and briskly reactive to light. CN III, IV, VI: extraocular movement are normal. No ptosis. CN V: Facial sensation is intact to light touch CN VII: Face is symmetric with normal eye closure  CN VIII: Hearing is normal to causal conversation. CN IX, X: Phonation is normal. CN XI: Head turning and shoulder shrug are intact  MOTOR: No significant bilateral upper extremity proximal and distal muscle weakness, no significant bilateral lower extremity proximal muscle weakness, fixed contraction of bilateral Achilles tendon, maximum dorsiflexion 90 degree, distal weakness, hammertoes, moderate ankle  dorsiflexion,/plantarflexion weakness  REFLEXES: Areflexia  SENSORY: Decreased vibratory sensation, pinprick, light touch to knee level  COORDINATION: There is no trunk or limb dysmetria noted.  GAIT/STANCE: Push-up from seated position, rely on his walker, mildly unsteady,  REVIEW OF SYSTEMS:  Full 14 system review of systems performed and notable only for as above All other review of systems were negative.   ALLERGIES: Allergies  Allergen Reactions   Codeine Swelling    Other reaction(s): rash   Penicillins Hives and Swelling    Has patient had a PCN reaction causing immediate rash, facial/tongue/throat swelling, SOB or lightheadedness with hypotension: Yes Has patient had a PCN reaction causing severe rash involving mucus membranes or skin necrosis: No Has patient had a PCN reaction that required hospitalization No Has patient had a PCN reaction occurring within the last 10 years: No If all of the above answers are "NO", then may proceed with Cephalosporin use.    Prednisone Hives    Other reaction(s): hives   Wool Alcohol [Lanolin] Hives   Empagliflozin Nausea And Vomiting    Other reaction(s): nausea and vomiting   Penicillin G     Other reaction(s): rash   Statins     Other reaction(s): myopathy    HOME MEDICATIONS: Current Outpatient Medications  Medication Sig Dispense Refill   apixaban (ELIQUIS) 5 MG TABS tablet Take 10 mg p.o. twice daily, on  Wed 12/05/21 at 1000-switch to 5 mg p.o. twice daily. 60 tablet    diltiazem (TIAZAC) 300 MG 24 hr capsule Take 1 capsule (300 mg total) by mouth daily. 90 capsule 3   folic acid (  FOLVITE) 1 MG tablet Take 1 mg by mouth daily.     HUMIRA PEN 40 MG/0.4ML PNKT Inject 0.4 mLs into the muscle every 14 (fourteen) days.     hydroxychloroquine (PLAQUENIL) 200 MG tablet Take 200 mg by mouth daily.     ibuprofen (ADVIL) 200 MG tablet Take 200 mg by mouth daily.     insulin aspart (NOVOLOG) 100 UNIT/ML injection 0-5 Units,  Subcutaneous, Daily at bedtime: HS scale CBG < 70: implement hypoglycemia measures CBG 70 - 120: 0 units CBG 121 - 150: 0 units CBG 151 - 200: 0 units CBG 201 - 250: 2 units CBG 251 - 300: 3 units CBG 301 - 350: 4 units CBG 351 - 400: 5 units CBG > 400: call MD and obtain STAT lab verification 10 mL 11   insulin aspart (NOVOLOG) 100 UNIT/ML injection 0-15 Units, Subcutaneous, 3 times daily with meals CBG < 70: Implement Hypoglycemia Standing Orders and refer to Hypoglycemia Standing Orders sidebar report CBG 70 - 120: 0 units CBG 121 - 150: 2 units CBG 151 - 200: 3 units CBG 201 - 250: 5 units CBG 251 - 300: 8 units CBG 301 - 350: 11 units CBG 351 - 400: 15 units CBG > 400: call MD and obtain STAT lab verification 10 mL 11   meclizine (ANTIVERT) 25 MG tablet Take 25 mg by mouth 3 (three) times daily as needed for dizziness.     methotrexate (RHEUMATREX) 2.5 MG tablet Take 6 tablets by mouth every Wednesday. Caution:Chemotherapy. Protect from light.     Study - ORION 4 - inclisiran 300 mg/1.70m or placebo SQ injection (PI-Stuckey) Inject 1.5 mLs (300 mg total) into the skin every 6 (six) months. 1 mL 1   Current Facility-Administered Medications  Medication Dose Route Frequency Provider Last Rate Last Admin   Study - ORION 4 - inclisiran 300 mg/1.552mor placebo SQ injection (PI-Stuckey)  300 mg Subcutaneous Q6 months StHillary BowMD        PAST MEDICAL HISTORY: Past Medical History:  Diagnosis Date   Anginal pain ([facility]   Arthritis    "fingers" (03/09/2012)   Cold feet    "from my diabetes; they stay cold" (03/09/2012)   Coronary artery disease    Coronary artery spasm ([facility]   since age [id number] [place] COVID 07/06/2019   hospitalizied   Dyslipidemia    Femoral DVT (deep venous thrombosis) ([name]    left   Headache(784.0)    Hx of gallstones    Dr InDalbert Batmanap cholecystectomy   Hx of plastic surgery    plastic surgery following a fall off a loading dock ,lost  all his upper teeth     Hypertension    MI, old    at age 76 Migraines    Pneumonia ~ 1962; 1970's   "double"; "regular" (03/09/2012)   Rheumatoid arthritis ([facility]   Dr BeAmil Amen  Slipped cervical disc 06/10/2006   Dr TaGaynelle Arabiann the past/ Dr SpRoderic Scarce chiropractor in 2008   Thyroid disease    Dr kuDwyane Dee Type II diabetes mellitus ([facility]    PAST SURGICAL HISTORY: Past Surgical History:  Procedure Laterality Date   CARDIAC CATHETERIZATION     x 3 with NO stents    CHOLECYSTECTOMY  03/11/2012   Procedure: LAPAROSCOPIC CHOLECYSTECTOMY WITH INTRAOPERATIVE CHOLANGIOGRAM;  Surgeon: HaAdin HectorMD;  [facility] Service: General;  Laterality: N/A;  KYPHOPLASTY N/A 05/30/2021   Procedure: THORACIC FIVE KYPHOPLASTY;  Surgeon: Ashok Pall, MD;  Location: [place];  Service: Neurosurgery;  Laterality: N/A;  THORACIC FIVE KYPHOPLASTY   PENILE PROSTHESIS IMPLANT  1983    FAMILY HISTORY: Family History  Problem Relation Age of Onset   Heart failure Mother    Cancer - Other Mother        breast   Heart disease Father    Cancer - Other Sister        tongue cancer   Heart attack Sister     SOCIAL HISTORY: Social History   Socioeconomic History   Marital status: Married    Spouse name: Enid Derry   Number of children: 0   Years of education: 14   Highest education level: Not on file  Occupational History    Comment: [employer]  Tobacco Use   Smoking status: Never   Smokeless tobacco: Never  Vaping Use   Vaping Use: Never used  Substance and Sexual Activity   Alcohol use: No   Drug use: No   Sexual activity: Yes  Other Topics Concern   Not on file  Social History Narrative   Lives with wife   Caffeine- Diet Coke 5 daily   Social Determinants of Health   Financial Resource Strain: Not on file  Food Insecurity: Not on file  Transportation Needs: Not on file  Physical Activity: Not on file  Stress: Not on file  Social Connections: Not on file  Intimate Partner Violence: Not  on file      Marcial Pacas, M.D. Ph.D.  [facility] [address], [place], [place] [address] Ph: [phone] Fax: [phone]  CC:  [name], [place],  [place] [address]  [name], DO

## 2022-04-10 NOTE — Procedures (Signed)
Full Name: Boy Delamater Gender: Male MRN #: [id number] Date of Birth: 1945-12-06    Visit Date: 04/10/2022 11:45 Age: 76 Years Examining Physician: Marcial Pacas Referring Physician: Marcial Pacas Height: 5 feet 10 inch History: 76 year old male with history of rheumatoid arthritis diffuse body achy pain, diabetes, presenting with progressive lower extremity weakness, sensory loss, gait abnormality  Summary of the test:  Nerve conduction study:  Right sural, median, ulnar sensory responses were absent.  Right radial sensory response was within normal limits  Right peroneal to EDB motor responses were absent.  Right tibial motor responses showed significantly decreased CMAP amplitude, significantly prolonged distal latency and F-wave latency.  Right ulnar motor responses showed mildly prolonged distal latency, F-wave latency  Right median motor responses showed significantly prolonged distal latency, moderately slowed conduction velocity and F-wave latency.  Electromyography:  Selected needle examinations were performed at bilateral lower extremity muscles, lumbosacral paraspinal muscles; right upper extremity muscles and right cervical paraspinal muscles.    There are evidence of chronic neuropathic changes at bilateral lower extremity muscles, distal more than proximal muscles.  There is also evidence of increased insertional activity, mild spontaneous activity, polyphasic neuropathic changes at the lower lumbar paraspinal muscles  Selected needle examination of right upper extremity muscles also showed chronic neuropathic changes, most noticeable at distal limb muscles.  Conclusion: This is an abnormal study.  There is electrodiagnostic evidence of length-dependent severe sensorimotor polyneuropathy, mainly axonal with mild mixed myelinating features.  There are also superimposed moderate to severe right carpal tunnel syndromes.  There is no evidence of intrinsic muscle  disease.    ------------------------------- Marcial Pacas M.D. Ph.D.  [facility] [address] [place], [place] [address] Tel: [phone] Fax: [phone]  Verbal informed consent was obtained from the patient, patient was informed of potential risk of procedure, including bruising, bleeding, hematoma formation, infection, muscle weakness, muscle pain, numbness, among others.        [place]    Nerve / Sites Muscle Latency Ref. Amplitude Ref. Rel Amp Segments Distance Velocity Ref. Area    ms ms mV mV %  cm m/s m/s mVms  R Median - APB     Wrist APB 5.8 ?4.4 4.8 ?4.0 100 Wrist - APB 7   20.5     Upper arm APB 12.9  1.4  28.8 Upper arm - Wrist 25 35 ?49 4.7  R Ulnar - ADM     Wrist ADM 3.5 ?3.3 6.9 ?6.0 100 Wrist - ADM 7   21.7     B.Elbow ADM 7.0  6.8  98.9 B.Elbow - Wrist 20 58 ?49 20.1     A.Elbow ADM 10.5  5.9  86.7 A.Elbow - B.Elbow 18 51 ?49 17.6  R Peroneal - EDB     Ankle EDB NR ?6.5 NR ?2.0 NR Ankle - EDB 9   NR         Pop fossa - Ankle      R Tibial - AH     Ankle AH 8.8 ?5.8 1.4 ?4.0 100 Ankle - AH 9   4.7     Pop fossa AH 23.0  0.8  58.4 Pop fossa - Ankle 50 35 ?41 4.2             SNC    Nerve / Sites Rec. Site Peak Lat Ref.  Amp Ref. Segments Distance    ms ms V V  cm  R Radial - Anatomical snuff  box (Forearm)     Forearm Wrist 2.6 ?2.9 17 ?15 Forearm - Wrist 10  R Sural - Ankle (Calf)     Calf Ankle NR ?4.4 NR ?6 Calf - Ankle 14  R Median - Orthodromic (Dig II, Mid palm)     Dig II Wrist NR ?3.4 NR ?10 Dig II - Wrist 13  R Ulnar - Orthodromic, (Dig V, Mid palm)     Dig V Wrist NR ?3.1 NR ?5 Dig V - Wrist 27             F  Wave    Nerve F Lat Ref.   ms ms  R Ulnar - ADM 33.5 ?32.0  R Median - APB 38.0 ?31.0  R Tibial - AH 74.0 ?56.0           EMG Summary Table    Spontaneous MUAP Recruitment  Muscle IA Fib PSW Fasc Other Amp Dur. Poly Pattern  R. Tibialis anterior Increased 1+ None None _______ Increased Increased 1+  Reduced  R. Tibialis posterior Increased 1+ 1+ None _______ Increased Increased 1+ Reduced  R. Peroneus longus Increased 1+ 1+ None _______ Normal Increased 1+ Reduced  R. Gastrocnemius (Medial head) Increased None None None _______ Increased Increased 1+ Reduced  R. Vastus lateralis Increased None None None _______ Normal Normal Normal Reduced  L. Tibialis posterior Increased 1+ 1+ None _______ Increased Increased 1+ Reduced  L. Peroneus longus Increased 1+ 1+ None _______ Increased Increased 1+ Reduced  L. Vastus lateralis Increased None None None _______ Normal Normal Normal Reduced  L. Gastrocnemius (Medial head) Increased 1+ 1+ None _______ Increased Increased 1+ Reduced  R. Lumbar paraspinals (low) Increased 1+ None None _______ Normal Normal Normal Normal  R. Lumbar paraspinals (mid) Normal None None None _______ Normal Normal Normal Normal  L. Lumbar paraspinals (low) Increased 1+ None None _______ Normal Normal Normal Normal  L. Lumbar paraspinals (mid) Normal None None None _______ Normal Normal Normal Normal  R. First dorsal interosseous Increased 1+ None None _______ Increased Increased 1+ Reduced  R.  Abductor pollicis brevis Increased 1+ None None _______ Increased Increased Normal Reduced  R. Abductor digiti minimi (manus) Increased 1+ None None _______ Increased Increased 1+ Reduced  R. Pronator teres Normal None None None _______ Normal Normal Normal Normal  R. Biceps brachii Normal None None None _______ Normal Normal Normal Normal  R. Deltoid Normal None None None _______ Normal Normal Normal Normal  R. Brachioradialis Normal None None None _______ Normal Normal Normal Normal  R. Extensor digitorum communis Increased None None None _______ Increased Increased 1+ Reduced  R. Cervical paraspinals Normal None None None _______ Normal Normal Normal Normal  R. Abductor pollicis brevis Increased 1+ None None _______ Increased Increased 1+ Reduced

## 2022-04-13 NOTE — Progress Notes (Signed)
EMG report is unde procedure tab

## 2022-04-29 DIAGNOSIS — M170 Bilateral primary osteoarthritis of knee: Secondary | ICD-10-CM | POA: Diagnosis not present

## 2022-04-30 ENCOUNTER — Encounter: Payer: Self-pay | Admitting: *Deleted

## 2022-04-30 DIAGNOSIS — Z006 Encounter for examination for normal comparison and control in clinical research program: Secondary | ICD-10-CM

## 2022-04-30 NOTE — Research (Signed)
Spoke with patient and significant other on phone.  Patient is making progress from his hospitalization.  He was in for 5 days due to weakness and PE.  Patient decided to only be followed by phone due to can't drive and hard to get around Last injection was 08/02/2021 Meds updated in edc   Current Outpatient Medications:    apixaban (ELIQUIS) 5 MG TABS tablet, Take 10 mg p.o. twice daily, on  Wed 12/05/21 at 1000-switch to 5 mg p.o. twice daily., Disp: 60 tablet, Rfl:    carbidopa-levodopa (SINEMET IR) 25-100 MG tablet, Take 1 tablet by mouth 3 (three) times daily., Disp: 90 tablet, Rfl: 6   diltiazem (TIAZAC) 300 MG 24 hr capsule, Take 1 capsule (300 mg total) by mouth daily., Disp: 90 capsule, Rfl: 3   folic acid (FOLVITE) 1 MG tablet, Take 1 mg by mouth daily., Disp: , Rfl:    HUMIRA PEN 40 MG/0.4ML PNKT, Inject 0.4 mLs into the muscle every 14 (fourteen) days., Disp: , Rfl:    hydroxychloroquine (PLAQUENIL) 200 MG tablet, Take 200 mg by mouth daily., Disp: , Rfl:    ibuprofen (ADVIL) 200 MG tablet, Take 200 mg by mouth daily., Disp: , Rfl:    insulin aspart (NOVOLOG) 100 UNIT/ML injection, 0-5 Units, Subcutaneous, Daily at bedtime: HS scale CBG < 70: implement hypoglycemia measures CBG 70 - 120: 0 units CBG 121 - 150: 0 units CBG 151 - 200: 0 units CBG 201 - 250: 2 units CBG 251 - 300: 3 units CBG 301 - 350: 4 units CBG 351 - 400: 5 units CBG > 400: call MD and obtain STAT lab verification, Disp: 10 mL, Rfl: 11   insulin aspart (NOVOLOG) 100 UNIT/ML injection, 0-15 Units, Subcutaneous, 3 times daily with meals CBG < 70: Implement Hypoglycemia Standing Orders and refer to Hypoglycemia Standing Orders sidebar report CBG 70 - 120: 0 units CBG 121 - 150: 2 units CBG 151 - 200: 3 units CBG 201 - 250: 5 units CBG 251 - 300: 8 units CBG 301 - 350: 11 units CBG 351 - 400: 15 units CBG > 400: call MD and obtain STAT lab verification, Disp: 10 mL, Rfl: 11   meclizine (ANTIVERT) 25 MG tablet, Take 25 mg by  mouth 3 (three) times daily as needed for dizziness., Disp: , Rfl:    methotrexate (RHEUMATREX) 2.5 MG tablet, Take 6 tablets by mouth every Wednesday. Caution:Chemotherapy. Protect from light., Disp: , Rfl:    Study - ORION 4 - inclisiran 300 mg/1.104mL or placebo SQ injection (PI-Stuckey), Inject 1.5 mLs (300 mg total) into the skin every 6 (six) months., Disp: 1 mL, Rfl: 1  Current Facility-Administered Medications:    Study - ORION 4 - inclisiran 300 mg/1.31mL or placebo SQ injection (PI-Stuckey), 300 mg, Subcutaneous, Q6 months, Herby Abraham, MD

## 2022-05-06 ENCOUNTER — Ambulatory Visit: Payer: Medicare HMO | Attending: Family Medicine | Admitting: Physical Therapy

## 2022-05-06 DIAGNOSIS — M6281 Muscle weakness (generalized): Secondary | ICD-10-CM | POA: Insufficient documentation

## 2022-05-06 DIAGNOSIS — Z9181 History of falling: Secondary | ICD-10-CM | POA: Insufficient documentation

## 2022-05-06 DIAGNOSIS — R2681 Unsteadiness on feet: Secondary | ICD-10-CM | POA: Insufficient documentation

## 2022-05-06 DIAGNOSIS — R2689 Other abnormalities of gait and mobility: Secondary | ICD-10-CM | POA: Insufficient documentation

## 2022-05-06 NOTE — Therapy (Incomplete)
OUTPATIENT PHYSICAL THERAPY NEURO EVALUATION   Patient Name: William Hunter MRN: [id number] DOB:[birth year], 76 y.o., male Today's Date: 05/06/2022   PCP: Marland Kitchen REFERRING PROVIDER: [name], DO  END OF SESSION:   Past Medical History:  Diagnosis Date   Anginal pain ([place])    Arthritis    "fingers" (03/09/2012)   Cold feet    "from my diabetes; they stay cold" (03/09/2012)   Coronary artery disease    Coronary artery spasm [facility])    since age 25 Dr Glade Lloyd   COVID 07/06/2019   hospitalizied   Dyslipidemia    Femoral DVT (deep venous thrombosis) ([name]    left   Headache(784.0)    Hx of gallstones    Dr Dalbert Batman lap cholecystectomy   Hx of plastic surgery    plastic surgery following a fall off a loading dock ,lost  all his upper teeth    Hypertension    MI, old    at age 24   Migraines    Pneumonia ~ 1962; 1970's   "double"; "regular" (03/09/2012)   Rheumatoid arthritis ([place])    Dr Amil Amen    Slipped cervical disc 06/10/2006   Dr Gaynelle Arabian in the past/ Dr Roderic Scarce , chiropractor in 2008   Thyroid disease    Dr Dwyane Dee   Type II diabetes mellitus [facility])    Past Surgical History:  Procedure Laterality Date   CARDIAC CATHETERIZATION     x 3 with NO stents    CHOLECYSTECTOMY  03/11/2012   Procedure: LAPAROSCOPIC CHOLECYSTECTOMY WITH INTRAOPERATIVE CHOLANGIOGRAM;  Surgeon: Adin Hector, MD;  Location: [place];  Service: General;  Laterality: N/A;   KYPHOPLASTY N/A 05/30/2021   Procedure: THORACIC FIVE KYPHOPLASTY;  Surgeon: Ashok Pall, MD;  Location: [place];  Service: Neurosurgery;  Laterality: N/A;  THORACIC FIVE KYPHOPLASTY   [place]   Patient Active Problem List   Diagnosis Date Noted   Neuropathy 04/10/2022   Abnormal finding on MRI of brain 04/10/2022   Gait abnormality 01/31/2022   Aortic atherosclerosis ([place]) 12/09/2021   History of pulmonary embolism 12/09/2021   Pure hypercholesterolemia 12/09/2021   Bilateral leg weakness  11/25/2021   Leg weakness, bilateral 11/25/2021   Lower extremity weakness 11/25/2021   Statin myopathy 02/22/2020   Acute respiratory failure with hypoxia ([place]) 07/03/2019   Acute respiratory failure due to COVID-19 ([place]) 07/03/2019   Migraine with aura and without status migrainosus, not intractable 09/02/2016   Diabetic polyneuropathy associated with type 2 diabetes mellitus ([place]) 09/02/2016   Rheumatoid arthritis ([place]) 09/02/2016   Coronary vasospasm ([place]) 04/06/2014   Gallstones 03/10/2012   Abdominal pain 03/09/2012   MI, old    Coronary artery disease    Hypertension    Dyslipidemia     ONSET DATE: 04/26/2022  REFERRING DIAG: G62.9 (ICD-10-CM) - Polyneuropathy, unspecified  THERAPY DIAG:  No diagnosis found.  Rationale for Evaluation and Treatment: Rehabilitation  SUBJECTIVE:  SUBJECTIVE STATEMENT: ***  Pt accompanied by: {accompnied:27141}  PERTINENT HISTORY: long-term sedentary lifestyle, deconditioning, chronic pain, rheumatoid arthritis, poorly controlled diabetes, OA, CAD, HTN  PAIN:  Are you having pain? {OPRCPAIN:27236}  PRECAUTIONS: {Therapy precautions:24002}  WEIGHT BEARING RESTRICTIONS: {Yes ***/No:24003}  FALLS: Has patient fallen in last 6 months? {fallsyesno:27318}  LIVING ENVIRONMENT: Lives with: {OPRC lives with:25569::"lives with their family"} Lives in: {Lives in:25570} Stairs: {opstairs:27293} Has following equipment at home: {Assistive devices:23999}  PLOF: {PLOF:24004}  PATIENT GOALS: ***  OBJECTIVE:   DIAGNOSTIC FINDINGS: Brain MRI 11/26/2021 IMPRESSION: No evidence of recent infarction, hemorrhage, or mass. No abnormal enhancement.   Moderate chronic microvascular ischemic changes.   Chronic disproportionate ventricular prominence likely  reflecting central volume loss. Normal pressure hydrocephalus is possible in the appropriate setting.  EMG nerve conduction study confirmed severe length-dependent axonal sensorimotor polyneuropathy, with marked mixed demyelinating features, no evidence of intrinsic muscle disease  COGNITION: Overall cognitive status: {cognition:24006}   SENSATION: {sensation:27233}  COORDINATION: ***  EDEMA:  {edema:24020}  MUSCLE TONE: {LE tone:25568}  MUSCLE LENGTH: Hamstrings: Right *** deg; Left *** deg Thomas test: Right *** deg; Left *** deg  DTRs:  {DTR SITE:24025}  POSTURE: {posture:25561}  LOWER EXTREMITY ROM:     {AROM/PROM:27142}  Right Eval Left Eval  Hip flexion    Hip extension    Hip abduction    Hip adduction    Hip internal rotation    Hip external rotation    Knee flexion    Knee extension    Ankle dorsiflexion    Ankle plantarflexion    Ankle inversion    Ankle eversion     (Blank rows = not tested)  LOWER EXTREMITY MMT:    MMT Right Eval Left Eval  Hip flexion    Hip extension    Hip abduction    Hip adduction    Hip internal rotation    Hip external rotation    Knee flexion    Knee extension    Ankle dorsiflexion    Ankle plantarflexion    Ankle inversion    Ankle eversion    (Blank rows = not tested)  BED MOBILITY:  {Bed mobility:24027}  TRANSFERS: Assistive device utilized: {Assistive devices:23999}  Sit to stand: {Levels of assistance:24026} Stand to sit: {Levels of assistance:24026} Chair to chair: {Levels of assistance:24026} Floor: {Levels of assistance:24026}  RAMP:  Level of Assistance: {Levels of assistance:24026} Assistive device utilized: {Assistive devices:23999} Ramp Comments: ***  CURB:  Level of Assistance: {Levels of assistance:24026} Assistive device utilized: {Assistive devices:23999} Curb Comments: ***  STAIRS: Level of Assistance: {Levels of assistance:24026} Stair Negotiation Technique: {Stair  Technique:27161} with {Rail Assistance:27162} Number of Stairs: ***  Height of Stairs: ***  Comments: ***  GAIT: Gait pattern: {gait characteristics:25376} Distance walked: *** Assistive device utilized: {Assistive devices:23999} Level of assistance: {Levels of assistance:24026} Comments: ***  FUNCTIONAL TESTS:  {Functional tests:24029}  PATIENT SURVEYS:  {rehab surveys:24030}  TODAY'S TREATMENT:                                                                                                                              ***  PATIENT EDUCATION: Education details: *** Person educated: {Person educated:25204} Education method: {Education Method:25205} Education comprehension: {Education Comprehension:25206}  HOME EXERCISE PROGRAM: ***  GOALS: Goals reviewed with patient? {yes/no:20286}  SHORT TERM GOALS: Target date: ***  *** Baseline: Goal status: {GOALSTATUS:25110}  2.  *** Baseline:  Goal status: {GOALSTATUS:25110}  3.  *** Baseline:  Goal status: {GOALSTATUS:25110}  4.  *** Baseline:  Goal status: {GOALSTATUS:25110}  5.  *** Baseline:  Goal status: {GOALSTATUS:25110}  6.  *** Baseline:  Goal status: {GOALSTATUS:25110}  LONG TERM GOALS: Target date: ***  *** Baseline:  Goal status: {GOALSTATUS:25110}  2.  *** Baseline:  Goal status: {GOALSTATUS:25110}  3.  *** Baseline:  Goal status: {GOALSTATUS:25110}  4.  *** Baseline:  Goal status: {GOALSTATUS:25110}  5.  *** Baseline:  Goal status: {GOALSTATUS:25110}  6.  *** Baseline:  Goal status: {GOALSTATUS:25110}  ASSESSMENT:  CLINICAL IMPRESSION: Patient is a *** year old *** referred to Neuro OPPT for***.   Pt's PMH is significant for: *** The following deficits were present during the exam: ***. Based on ***, pt is an incr risk for falls. Pt would benefit from skilled PT to address these impairments and functional limitations to maximize functional mobility  independence   OBJECTIVE IMPAIRMENTS: {opptimpairments:25111}.   ACTIVITY LIMITATIONS: {activitylimitations:27494}  PARTICIPATION LIMITATIONS: {participationrestrictions:25113}  PERSONAL FACTORS: {Personal factors:25162} are also affecting patient's functional outcome.   REHAB POTENTIAL: {rehabpotential:25112}  CLINICAL DECISION MAKING: {clinical decision making:25114}  EVALUATION COMPLEXITY: {Evaluation complexity:25115}  PLAN:  PT FREQUENCY: {rehab frequency:25116}  PT DURATION: {rehab duration:25117}  PLANNED INTERVENTIONS: {rehab planned interventions:25118::"Therapeutic exercises","Therapeutic activity","Neuromuscular re-education","Balance training","Gait training","Patient/Family education","Self Care","Joint mobilization"}  PLAN FOR NEXT SESSION: ***   Peter Congo, PT, DPT, CSRS 05/06/2022, 8:33 AM

## 2022-05-07 ENCOUNTER — Encounter: Payer: Self-pay | Admitting: Physical Therapy

## 2022-05-07 ENCOUNTER — Ambulatory Visit: Payer: Medicare HMO | Admitting: Physical Therapy

## 2022-05-07 DIAGNOSIS — Z9181 History of falling: Secondary | ICD-10-CM

## 2022-05-07 DIAGNOSIS — M6281 Muscle weakness (generalized): Secondary | ICD-10-CM | POA: Diagnosis not present

## 2022-05-07 DIAGNOSIS — R2681 Unsteadiness on feet: Secondary | ICD-10-CM

## 2022-05-07 DIAGNOSIS — R2689 Other abnormalities of gait and mobility: Secondary | ICD-10-CM | POA: Diagnosis not present

## 2022-05-07 NOTE — Therapy (Signed)
OUTPATIENT PHYSICAL THERAPY NEURO EVALUATION   Patient Name: William Hunter MRN: [id number] DOB:September 27, 1945, 76 y.o., male Today's Date: 05/07/2022   PCP: Irena Reichmann DO REFERRING PROVIDER: Irena Reichmann DO   END OF SESSION:  PT End of Session - 05/07/22 1330     Visit Number 1    Number of Visits 13    Date for PT Re-Evaluation 06/18/22    Authorization Type [employer] MCR    Authorization Time Period 05/07/22 to 06/18/22    Progress Note Due on Visit 10    PT Start Time 1231    PT Stop Time 1311    PT Time Calculation (min) 40 min    Activity Tolerance Patient tolerated treatment well;Patient limited by fatigue    Behavior During Therapy [facility] for tasks assessed/performed;Anxious             Past Medical History:  Diagnosis Date   Anginal pain ([name]    Arthritis    "fingers" (03/09/2012)   Cold feet    "from my diabetes; they stay cold" (03/09/2012)   Coronary artery disease    Coronary artery spasm [facility])    since age 89 Dr Aleen Campi   COVID 07/06/2019   hospitalizied   Dyslipidemia    Femoral DVT (deep venous thrombosis) ([name]    left   Headache(784.0)    Hx of gallstones    Dr Derrell Lolling lap cholecystectomy   Hx of plastic surgery    plastic surgery following a fall off a loading dock ,lost  all his upper teeth    Hypertension    MI, old    at age 14   Migraines    Pneumonia ~ 1962; 1970's   "double"; "regular" (03/09/2012)   Rheumatoid arthritis ([name]    Dr Dierdre Forth    Slipped cervical disc 06/10/2006   Dr Patsi Sears in the past/ Dr Larita Fife , chiropractor in 2008   Thyroid disease    Dr Lucianne Muss   Type II diabetes mellitus [facility])    Past Surgical History:  Procedure Laterality Date   CARDIAC CATHETERIZATION     x 3 with NO stents    CHOLECYSTECTOMY  03/11/2012   Procedure: LAPAROSCOPIC CHOLECYSTECTOMY WITH INTRAOPERATIVE CHOLANGIOGRAM;  Surgeon: Ernestene Mention, MD;  Location: [facility] OR;  Service: General;  Laterality: N/A;   KYPHOPLASTY N/A 05/30/2021    Procedure: THORACIC FIVE KYPHOPLASTY;  Surgeon: Coletta Memos, MD;  Location: [facility] OR;  Service: Neurosurgery;  Laterality: N/A;  THORACIC FIVE KYPHOPLASTY   PENILE PROSTHESIS IMPLANT  1983   Patient Active Problem List   Diagnosis Date Noted   Neuropathy 04/10/2022   Abnormal finding on MRI of brain 04/10/2022   Gait abnormality 01/31/2022   Aortic atherosclerosis ([name] 12/09/2021   History of pulmonary embolism 12/09/2021   Pure hypercholesterolemia 12/09/2021   Bilateral leg weakness 11/25/2021   Leg weakness, bilateral 11/25/2021   Lower extremity weakness 11/25/2021   Statin myopathy 02/22/2020   Acute respiratory failure with hypoxia ([name] 07/03/2019   Acute respiratory failure due to COVID-19 ([name] 07/03/2019   Migraine with aura and without status migrainosus, not intractable 09/02/2016   Diabetic polyneuropathy associated with type 2 diabetes mellitus ([name] 09/02/2016   Rheumatoid arthritis ([name] 09/02/2016   Coronary vasospasm ([name] 04/06/2014   Gallstones 03/10/2012   Abdominal pain 03/09/2012   MI, old    Coronary artery disease    Hypertension    Dyslipidemia     ONSET DATE: 04/26/2022  REFERRING DIAG: G62.9 (ICD-10-CM) - Polyneuropathy, unspecified  THERAPY DIAG:  Unsteadiness on feet  Other abnormalities of gait and mobility  Muscle weakness  History of falling  Rationale for Evaluation and Treatment: Rehabilitation  SUBJECTIVE:                                                                                                                                                                                             SUBJECTIVE STATEMENT:  I was just discharged by HHPT last week. This all started when I was hospitalized this summer, I lost a lot of my mobility. I had a fall Sunday trying to get a coke out of the fridge, I was able to control my descent by sliding down the wall, no injuries other than a skinned elbow. I have RA, they keep telling me I have  fluid on the brain, according to Dr. Terrace Arabia they found severe nerve damage in both legs I've had it since I came home from rehab.  Got the shot in my knees 2 weeks ago, steroid shots.   Pt accompanied by: self  PERTINENT HISTORY:   PAIN:  Are you having pain? Yes: NPRS scale: 3/10; can get up to 7/10  Pain location: B knees  Pain description: throbbing  Aggravating factors: nothing  Relieving factors: shots   PRECAUTIONS: Fall  WEIGHT BEARING RESTRICTIONS: No  FALLS: Has patient fallen in last 6 months? Yes. Number of falls 1 on Sunday as above; (+) FOF, not to the point that he is hesitant to leave the house   LIVING ENVIRONMENT: Lives with: lives with their spouse (cannot physically assist) Lives in: Other duplex  Stairs: 1 STE no rails (about 4 inches tall); no steps inside the home  Has following equipment at home: Quad cane small base, Quad cane large base, Walker - 2 wheeled, shower chair, Grab bars, and hand held shower, bed rail, WC    PLOF: Independent, Independent with basic ADLs, and Requires assistive device for independence  PATIENT GOALS: be able to get up and move with cane, get rid of walker   OBJECTIVE:   DIAGNOSTIC FINDINGS: Conclusion: This is an abnormal study.  There is electrodiagnostic evidence of length-dependent severe sensorimotor polyneuropathy, mainly axonal with mild mixed myelinating features.  There are also superimposed moderate to severe right carpal tunnel syndromes.  There is no evidence of intrinsic muscle disease.  COGNITION: Overall cognitive status: Within functional limits for tasks assessed   SENSATION: Neuropathic pain in BLEs   COORDINATION:  RAM WNL BLEs and BUEs   EDEMA:    MUSCLE TONE:   MUSCLE LENGTH:   DTRs:    POSTURE:   LOWER  EXTREMITY ROM:     Active  Right Eval Left Eval  Hip flexion    Hip extension    Hip abduction    Hip adduction    Hip internal rotation    Hip external rotation    Knee flexion     Knee extension    Ankle dorsiflexion    Ankle plantarflexion    Ankle inversion    Ankle eversion     (Blank rows = not tested)  LOWER EXTREMITY MMT:    MMT Right Eval Left Eval  Hip flexion 3 3  Hip extension    Hip abduction 2+ 2+  Hip adduction    Hip internal rotation    Hip external rotation    Knee flexion 4- 4  Knee extension 4 4+  Ankle dorsiflexion 3+ 3+  Ankle plantarflexion    Ankle inversion    Ankle eversion    (Blank rows = not tested)  BED MOBILITY:  Sit to supine SBA Supine to sit SBA Rolling to Right Min A Rolling to Left Min A  TRANSFERS: Assistive device utilized: Environmental consultant - 2 wheeled  Sit to stand: Modified independence Stand to sit: Modified independence Chair to chair: Modified independence Floor:   RAMP:   CURB:   STAIRS:   GAIT: Gait pattern: step to pattern, decreased arm swing- Right, decreased arm swing- Left, decreased step length- Right, decreased step length- Left, decreased stance time- Right, decreased stance time- Left, decreased stride length, decreased ankle dorsiflexion- Right, decreased ankle dorsiflexion- Left, trendelenburg, decreased trunk rotation, and trunk flexed Distance walked: 63ft; gait speed 0.57ft/s with RW  Assistive device utilized: Environmental consultant - 2 wheeled Level of assistance: SBA Comments: slow but steady tends to push RW too far ahead   FUNCTIONAL TESTS:  5 times sit to stand: 53 seconds using BUEs on chair  Timed up and go (TUG): 34 seconds with RW  : 142ft with RW limited by fatigue   PATIENT SURVEYS:    TODAY'S TREATMENT:                                                                                                                              DATE:   Eval- objective measures and education     PATIENT EDUCATION: Education details: exam findings, POC Person educated: Patient Education method: Explanation Education comprehension: verbalized understanding  HOME EXERCISE PROGRAM: Will give  2nd session   GOALS: Goals reviewed with patient? Yes  SHORT TERM GOALS: Target date: 05/28/2022    Will be compliant with appropriate progressive HEP  Baseline: Goal status: INITIAL  2.  Will be able to complete 5x STS in 35 seconds or less to show improved functional mobility and LE strength  Baseline:  Goal status: INITIAL  3.  Will be able to complete TUG in 20 seconds or less with RW to show improved functional mobility and balance  Baseline:  Goal status: INITIAL  4.  Will be able  to name 3 ways to reduce fall risk at home and in community  Baseline:  Goal status: INITIAL   LONG TERM GOALS: Target date: 06/18/2022    MMT to improve by at least 1 grade in all weak groups  Baseline:  Goal status: INITIAL  2.  Will be able to ambulate at least 368ft in with LRAD to show improved functional mobility and community access  Baseline:  Goal status: INITIAL  3.  Will be able to ambulate household distances over level surfaces  with [facility] with no more than min guard  Baseline:  Goal status: INITIAL  4.  Will demonstrate ability to navigate single step with [facility] and no more than min guard  Baseline:  Goal status: INITIAL   ASSESSMENT:  CLINICAL IMPRESSION: Patient is a 77 y.o. M who was seen today for physical therapy evaluation and treatment for polyneuropathy. Of note he does have a complex medical history and appears to have lost a lot of mobility during his hospital stay earlier this year- received rehab in SNF setting, followed by HHPT prior to transition to OP PT. Very high fall risk with poor safety awareness. Able to transfer from standard height chair with Mod(I) and increased time but required ModAx1/Min guard of 1 to stand from low mat table due to FOF during more challenging transfer from low surface. Has very poor functional activity tolerance. Will benefit from skilled PT services to address limitations, reduce fall risk, and optimize level of function  moving forward.   OBJECTIVE IMPAIRMENTS: Abnormal gait, decreased activity tolerance, decreased knowledge of use of DME, decreased mobility, difficulty walking, decreased strength, decreased safety awareness, impaired perceived functional ability, impaired sensation, postural dysfunction, and pain.   ACTIVITY LIMITATIONS: standing, squatting, stairs, transfers, bed mobility, and locomotion level  PARTICIPATION LIMITATIONS: shopping, community activity, yard work, and church  PERSONAL FACTORS: Age, Behavior pattern, Education, Fitness, Past/current experiences, Social background, and Time since onset of injury/illness/exacerbation are also affecting patient's functional outcome.   REHAB POTENTIAL: Good  CLINICAL DECISION MAKING: Evolving/moderate complexity  EVALUATION COMPLEXITY: Moderate  PLAN:  PT FREQUENCY: 2x/week  PT DURATION: 6 weeks  PLANNED INTERVENTIONS: Therapeutic exercises, Therapeutic activity, Neuromuscular re-education, Balance training, Gait training, Patient/Family education, Self Care, Aquatic Therapy, and Manual therapy  PLAN FOR NEXT SESSION: needs HEP updates; general focus on strength, balance, endurance, he really wants to get away from RW if possible but right now this is not safe    Ledell Codrington U PT DPT PN2  05/07/2022, 1:32 PM

## 2022-05-14 ENCOUNTER — Ambulatory Visit: Payer: Medicare HMO | Admitting: Physical Therapy

## 2022-05-16 ENCOUNTER — Ambulatory Visit: Payer: Medicare HMO | Admitting: Physical Therapy

## 2022-05-21 ENCOUNTER — Ambulatory Visit: Payer: Medicare HMO | Admitting: Physical Therapy

## 2022-05-23 ENCOUNTER — Ambulatory Visit: Payer: Medicare HMO | Admitting: Physical Therapy

## 2022-05-24 DIAGNOSIS — Z79631 Long term (current) use of antimetabolite agent: Secondary | ICD-10-CM | POA: Diagnosis not present

## 2022-05-24 DIAGNOSIS — Z79899 Other long term (current) drug therapy: Secondary | ICD-10-CM | POA: Diagnosis not present

## 2022-05-24 DIAGNOSIS — G8220 Paraplegia, unspecified: Secondary | ICD-10-CM | POA: Diagnosis not present

## 2022-05-24 DIAGNOSIS — R29898 Other symptoms and signs involving the musculoskeletal system: Secondary | ICD-10-CM | POA: Diagnosis not present

## 2022-05-24 DIAGNOSIS — G629 Polyneuropathy, unspecified: Secondary | ICD-10-CM | POA: Diagnosis not present

## 2022-05-24 DIAGNOSIS — R269 Unspecified abnormalities of gait and mobility: Secondary | ICD-10-CM | POA: Diagnosis not present

## 2022-05-24 DIAGNOSIS — E1142 Type 2 diabetes mellitus with diabetic polyneuropathy: Secondary | ICD-10-CM | POA: Diagnosis not present

## 2022-05-24 DIAGNOSIS — M0609 Rheumatoid arthritis without rheumatoid factor, multiple sites: Secondary | ICD-10-CM | POA: Diagnosis not present

## 2022-05-28 ENCOUNTER — Ambulatory Visit: Payer: Medicare HMO | Admitting: Physical Therapy

## 2022-05-30 ENCOUNTER — Ambulatory Visit: Payer: Medicare HMO | Admitting: Physical Therapy

## 2022-05-30 DIAGNOSIS — G43909 Migraine, unspecified, not intractable, without status migrainosus: Secondary | ICD-10-CM | POA: Diagnosis not present

## 2022-05-30 DIAGNOSIS — M170 Bilateral primary osteoarthritis of knee: Secondary | ICD-10-CM | POA: Diagnosis not present

## 2022-05-30 DIAGNOSIS — I10 Essential (primary) hypertension: Secondary | ICD-10-CM | POA: Diagnosis not present

## 2022-05-30 DIAGNOSIS — I25118 Atherosclerotic heart disease of native coronary artery with other forms of angina pectoris: Secondary | ICD-10-CM | POA: Diagnosis not present

## 2022-06-05 ENCOUNTER — Ambulatory Visit: Payer: Medicare HMO | Admitting: Physical Therapy

## 2022-06-07 ENCOUNTER — Ambulatory Visit: Payer: Medicare HMO | Admitting: Physical Therapy

## 2022-06-11 ENCOUNTER — Ambulatory Visit: Payer: Medicare HMO | Admitting: Physical Therapy

## 2022-06-13 ENCOUNTER — Ambulatory Visit: Payer: Medicare HMO | Admitting: Physical Therapy

## 2022-06-18 ENCOUNTER — Ambulatory Visit: Payer: Medicare HMO | Admitting: Physical Therapy

## 2022-06-20 ENCOUNTER — Ambulatory Visit: Payer: Medicare HMO | Admitting: Physical Therapy

## 2022-09-04 DIAGNOSIS — M069 Rheumatoid arthritis, unspecified: Secondary | ICD-10-CM | POA: Diagnosis not present

## 2022-09-04 DIAGNOSIS — G629 Polyneuropathy, unspecified: Secondary | ICD-10-CM | POA: Diagnosis not present

## 2022-09-04 DIAGNOSIS — R29898 Other symptoms and signs involving the musculoskeletal system: Secondary | ICD-10-CM | POA: Diagnosis not present

## 2022-09-04 DIAGNOSIS — M170 Bilateral primary osteoarthritis of knee: Secondary | ICD-10-CM | POA: Diagnosis not present

## 2022-09-25 NOTE — Progress Notes (Signed)
Office Visit Note  Patient: William Hunter             Date of Birth: 25-May-1946           MRN: [id number]             PCP: Irena Reichmann, DO Referring: Irena Reichmann, DO Visit Date: 09/26/2022   Subjective:  New Patient (Initial Visit) (Patient's wife states the patient fell three weeks ago and has been worse since then. Patient states his left side is worse. Patient states he hurts everywhere.)   History of Present Illness: William Hunter is a 77 y.o. male here for seronegative rheumatoid arthritis on humira and plaquenil. Previously was also on methotrexate but out of medication due to too many months since last follow up visit with his previous office.  Prior to establishing care there in 2022 he had seen rheumatology in [place] for longstanding disease over many years.  He has been doing particularly poorly since last year had significant worsening with lower extremity decreased mobility and increased frequency of falls.  He has known severe bilateral knee osteoarthritis was not thought to be a good surgical candidate due to cardiovascular comorbidities.  Has been treated with repeated injections both steroids and viscosupplementation.  Neurology assessment in August findings thought multifactorial with deconditioning, uncontrolled arthritis, and longstanding poorly controlled diabetes with peripheral neuropathy.  He is on Eliquis anticoagulation for the incidentally found venous thrombosis. Recently he is still doing poorly he is having falls every few weeks with extensive bruising.  In the past 1 week has weakness has been worse than usual also having increased cough and shortness of breath.  He thinks this is more related to seasonal allergic rhinitis and drainage.  Previous DMARDs SSZ Prednisone - intolerance  11/2021 MyoMarker 3 panel neg  08/2020 RF neg CCP neg HBV/HCV neg SPEP wnl  Activities of Daily Living:  Patient reports morning stiffness for 30 minutes.   Patient  Reports nocturnal pain.  Difficulty dressing/grooming: Reports Difficulty climbing stairs: Reports Difficulty getting out of chair: Reports Difficulty using hands for taps, buttons, cutlery, and/or writing: Reports  Review of Systems  Constitutional:  Positive for fatigue.  HENT:  Negative for mouth sores and mouth dryness.   Eyes:  Negative for dryness.  Respiratory:  Positive for shortness of breath.   Cardiovascular:  Positive for chest pain. Negative for palpitations.  Gastrointestinal:  Negative for blood in stool, constipation and diarrhea.  Endocrine: Positive for increased urination.  Genitourinary:  Positive for involuntary urination.  Musculoskeletal:  Positive for joint pain, gait problem, joint pain, joint swelling, myalgias, muscle weakness, morning stiffness, muscle tenderness and myalgias.  Skin:  Positive for rash and sensitivity to sunlight. Negative for color change and hair loss.  Allergic/Immunologic: Negative for susceptible to infections.  Neurological:  Positive for dizziness and headaches.  Hematological:  Negative for swollen glands.  Psychiatric/Behavioral:  Positive for depressed mood. Negative for sleep disturbance. The patient is nervous/anxious.     PMFS History:  Patient Active Problem List   Diagnosis Date Noted   Elevated troponin 09/30/2022   Chest pain 09/29/2022   High risk medication use 09/26/2022   Neuropathy 04/10/2022   Abnormal finding on MRI of brain 04/10/2022   Gait abnormality 01/31/2022   Aortic atherosclerosis ([name] 12/09/2021   History of pulmonary embolism 12/09/2021   Pure hypercholesterolemia 12/09/2021   Bilateral leg weakness 11/25/2021   Leg weakness, bilateral 11/25/2021   Lower extremity weakness 11/25/2021  Statin myopathy 02/22/2020   Acute respiratory failure with hypoxia ([name] 07/03/2019   Acute respiratory failure due to COVID-19 [facility]) 07/03/2019   Migraine with aura and without status migrainosus, not intractable  09/02/2016   Diabetic polyneuropathy associated with type 2 diabetes mellitus ([name] 09/02/2016   Rheumatoid arthritis ([name] 09/02/2016   Coronary vasospasm ([name] 04/06/2014   Gallstones 03/10/2012   Abdominal pain 03/09/2012   NSTEMI (non-ST elevation myocardial infarction) [facility])    Coronary artery disease    Hypertension    Dyslipidemia     Past Medical History:  Diagnosis Date   Anginal pain ([name]    Arthritis    "fingers" (03/09/2012)   Cold feet    "from my diabetes; they stay cold" (03/09/2012)   Coronary artery disease    Coronary artery spasm [facility])    since age 26 Dr Aleen Campi   COVID 07/06/2019   hospitalizied   Dyslipidemia    Femoral DVT (deep venous thrombosis) ([name]    left   Headache(784.0)    Hx of gallstones    Dr Derrell Lolling lap cholecystectomy   Hx of plastic surgery    plastic surgery following a fall off a loading dock ,lost  all his upper teeth    Hypertension    MI, old    at age 27   Migraines    Pneumonia ~ 1962; 1970's   "double"; "regular" (03/09/2012)   Rheumatoid arthritis ([name]    Dr Dierdre Forth    Slipped cervical disc 06/10/2006   Dr Patsi Sears in the past/ Dr Larita Fife , chiropractor in 2008   Thyroid disease    Dr Lucianne Muss   Type II diabetes mellitus ([name]     Family History  Problem Relation Age of Onset   Heart failure Mother    Cancer - Other Mother        breast   Heart disease Father    Cancer - Other Sister        tongue cancer   Heart attack Sister    Past Surgical History:  Procedure Laterality Date   CARDIAC CATHETERIZATION     x 3 with NO stents    CHOLECYSTECTOMY  03/11/2012   Procedure: LAPAROSCOPIC CHOLECYSTECTOMY WITH INTRAOPERATIVE CHOLANGIOGRAM;  Surgeon: Ernestene Mention, MD;  Location: [facility] OR;  Service: General;  Laterality: N/A;   CORONARY STENT INTERVENTION N/A 10/02/2022   Procedure: CORONARY STENT INTERVENTION;  Surgeon: Corky Crafts, MD;  Location: [facility] INVASIVE CV LAB;  Service: Cardiovascular;  Laterality: N/A;    CORONARY ULTRASOUND/IVUS N/A 10/02/2022   Procedure: Coronary Ultrasound/IVUS;  Surgeon: Corky Crafts, MD;  Location: [facility] INVASIVE CV LAB;  Service: Cardiovascular;  Laterality: N/A;   KYPHOPLASTY N/A 05/30/2021   Procedure: THORACIC FIVE KYPHOPLASTY;  Surgeon: Coletta Memos, MD;  Location: [facility] OR;  Service: Neurosurgery;  Laterality: N/A;  THORACIC FIVE KYPHOPLASTY   LEFT HEART CATH AND CORONARY ANGIOGRAPHY N/A 09/30/2022   Procedure: LEFT HEART CATH AND CORONARY ANGIOGRAPHY;  Surgeon: Corky Crafts, MD;  Location: [facility] INVASIVE CV LAB;  Service: Cardiovascular;  Laterality: N/A;   PENILE PROSTHESIS IMPLANT  1983   Social History   Social History Narrative   Lives with wife   Caffeine- Diet Coke 5 daily   Immunization History  Administered Date(s) Administered   PFIZER(Purple Top)SARS-COV-2 Vaccination 09/16/2019, 10/11/2019     Objective: Vital Signs: BP 127/75 (BP Location: Right Arm, Patient Position: Sitting, Cuff Size: Normal)   Pulse (!) 111   Resp 12   Ht  5\' 10"  (1.778 m) Comment: patient in wheelchair  Wt 173 lb (78.5 kg) Comment: Patient in wheelchair  BMI 24.82 kg/m    Physical Exam Constitutional:      Comments: In wheelchair Chronically ill-appearing  Eyes:     Conjunctiva/sclera: Conjunctivae normal.  Cardiovascular:     Rate and Rhythm: Regular rhythm. Tachycardia present.  Pulmonary:     Effort: Pulmonary effort is normal.     Breath sounds: Normal breath sounds.  Musculoskeletal:     Right lower leg: No edema.     Left lower leg: No edema.  Skin:    General: Skin is warm and dry.     Findings: Bruising present.     Comments: Few bruises and abrasions on left forearm  Neurological:     Mental Status: He is alert.      Musculoskeletal Exam: Bilateral shoulder tenderness left side is much worse with significant pain and guarding against abduction at or above horizontal level and an external rotation Elbows full ROM no tenderness or  swelling Wrists full ROM no tenderness or swelling Fingers tenderness worst around second through fourth MCP joints of the left hand, Heberden's nodes in both hands no palpable synovitis Knees restriction range of motion bilaterally and some joint line tenderness to palpation with no effusions   CDAI Exam: CDAI Score: 14  Patient Global: 50 mm; Provider Global: 20 mm Swollen: 0 ; Tender: 7  Joint Exam 09/26/2022      Right  Left  Glenohumeral   Tender   Tender  MCP 2      Tender  MCP 3      Tender  MCP 4      Tender  Knee   Tender   Tender      Investigation: No additional findings.  Imaging: XR Hand 2 View Right  Result Date: 10/17/2022 X-ray right hand 2 views Radiocarpal joint space appears normal.  There degenerative first [facility]MC joint arthritis in PIP and DIP joints.  No erosions or abnormal calcifications seen.  Bone mineralization appears normal. Impression Mild to moderate generalized osteoarthritis changes no visible erosions  XR Hand 2 View Left  Result Date: 10/17/2022 X-ray left hand 2 views Radiocarpal joint space appears normal.  Multiple cystic changes present throughout the carpal bones moderate first [facility] joint degenerative changes.  MCP joints appear normal.  Mild distal joint degenerative arthritis with a few subchondral cyst.  Some erosive appearing lesions at the fifth finger distal tuft.  No other calcifications seen. Impression Mild arthritis changes throughout and erosions at tip of fifth digit  CARDIAC CATHETERIZATION  Addendum Date: 10/02/2022     Mid LAD lesion is 75% stenosed.  A drug-eluting stent was successfully placed using a SYNERGY XD 3.50X38, postdilated to 3.75 mm and optimized with intravascular ultrasound.   Post intervention, there is a 0% residual stenosis.   Ramus-2 lesion is 75% stenosed.   Ramus-1 lesion is 80% stenosed.   Ost Cx to Prox Cx lesion is 50% stenosed- eccentric, most notable in caudal view.   Mid LM to Ost LAD lesion is 40%  stenosed.  Smallest Cross-sectional area 6.6 mm by intravascular ultrasound, not significant.   Ost LAD to Prox LAD lesion is 50% stenosed.  Cross-sectional area 4.2 mm, not significant, by intravascular ultrasound for a very short segment, just before the left main.  The remainder of the LAD cross-sectional area is significantly larger.   A drug-eluting stent was successfully placed using a SYNERGY XD 3.50X38.  Continue dual antiplatelet therapy along with aggressive secondary prevention.  Okay to switch to Brilinta if that has less drug interactions with his other neurologic medicines.  Will check with pharmacy prior to discharge.  Result Date: 10/02/2022   Mid LAD lesion is 75% stenosed.  A drug-eluting stent was successfully placed using a SYNERGY XD 3.50X38, postdilated to 3.75 mm and optimized with intravascular ultrasound.   Post intervention, there is a 0% residual stenosis.   Ramus-2 lesion is 75% stenosed.   Ramus-1 lesion is 80% stenosed.   Ost Cx to Prox Cx lesion is 50% stenosed- eccentric, most notable in caudal view.   Mid LM to Ost LAD lesion is 40% stenosed.  Smallest Cross-sectional area 6.6 mm by intravascular ultrasound.   Ost LAD to Prox LAD lesion is 50% stenosed.  Cross-sectional area 4.2 mm by intravascular ultrasound for a very short segment, just before the left main.  The remainder of the LAD cross-sectional area is significantly   A drug-eluting stent was successfully placed using a SYNERGY XD 3.50X38. Continue dual antiplatelet therapy along with aggressive secondary prevention.  Okay to switch to Brilinta if that has less drug interactions with his other neurologic medicines.  Will check with pharmacy prior to discharge.   ECHOCARDIOGRAM COMPLETE  Result Date: 09/30/2022    ECHOCARDIOGRAM REPORT   Patient Name:   BERTICE HELMICH Date of Exam: 09/30/2022 Medical Rec #:  [id number]     Height:       70.0 in Accession #:    [id number]    Weight:       173.7 lb Date of Birth:   [birth year]     BSA:          1.966 m Patient Age:    76 years      BP:           106/65 mmHg Patient Gender: M             HR:           76 bpm. Exam Location:  Inpatient Procedure: 2D Echo, Cardiac Doppler and Color Doppler Indications:    Chest pain R07.9  History:        Patient has prior history of Echocardiogram examinations, most                 recent 11/27/2021. CAD, Signs/Symptoms:Chest Pain; Risk                 Factors:Dyslipidemia, Non-Smoker and Diabetes. H/O PE.  Sonographer:    Dondra Prader RVT Referring Phys: [id number] Parke Poisson  Sonographer Comments: Technically challenging study due to limited acoustic windows, Technically difficult study due to poor echo windows, suboptimal parasternal window, suboptimal apical window and suboptimal subcostal window. Patient had a hard time tolerating exam; declined Definity IMPRESSIONS  1. Left ventricular ejection fraction, by estimation, is 60 to 65%. The left ventricle has normal function. The left ventricle has no regional wall motion abnormalities. There is mild concentric left ventricular hypertrophy. Left ventricular diastolic parameters are consistent with Grade I diastolic dysfunction (impaired relaxation).  2. Right ventricular systolic function is normal. The right ventricular size is normal.  3. Left atrial size was mildly dilated.  4. The mitral valve is normal in structure. No evidence of mitral valve regurgitation. No evidence of mitral stenosis.  5. The aortic valve is tricuspid. There is mild calcification of the aortic valve. Aortic valve regurgitation is not visualized. Aortic valve sclerosis/calcification is present, without  any evidence of aortic stenosis. Aortic valve mean gradient measures 3.0 mmHg. Aortic valve Vmax measures 1.13 m/s.  6. The inferior vena cava is normal in size with greater than 50% respiratory variability, suggesting right atrial pressure of 3 mmHg. FINDINGS  Left Ventricle: Left ventricular ejection fraction, by  estimation, is 60 to 65%. The left ventricle has normal function. The left ventricle has no regional wall motion abnormalities. The left ventricular internal cavity size was normal in size. There is  mild concentric left ventricular hypertrophy. Left ventricular diastolic parameters are consistent with Grade I diastolic dysfunction (impaired relaxation). Right Ventricle: The right ventricular size is normal. No increase in right ventricular wall thickness. Right ventricular systolic function is normal. Left Atrium: Left atrial size was mildly dilated. Right Atrium: Right atrial size was normal in size. Pericardium: There is no evidence of pericardial effusion. Mitral Valve: The mitral valve is normal in structure. No evidence of mitral valve regurgitation. No evidence of mitral valve stenosis. Tricuspid Valve: The tricuspid valve is normal in structure. Tricuspid valve regurgitation is trivial. No evidence of tricuspid stenosis. Aortic Valve: The aortic valve is tricuspid. There is mild calcification of the aortic valve. Aortic valve regurgitation is not visualized. Aortic valve sclerosis/calcification is present, without any evidence of aortic stenosis. Aortic valve mean gradient measures 3.0 mmHg. Aortic valve peak gradient measures 5.1 mmHg. Pulmonic Valve: The pulmonic valve was normal in structure. Pulmonic valve regurgitation is not visualized. No evidence of pulmonic stenosis. Aorta: The aortic root is normal in size and structure. Venous: The inferior vena cava is normal in size with greater than 50% respiratory variability, suggesting right atrial pressure of 3 mmHg. IAS/Shunts: No atrial level shunt detected by color flow Doppler.  LEFT VENTRICLE PLAX 2D LVIDd:         4.60 cm   Diastology LVIDs:         3.50 cm   LV e' medial:    5.48 cm/s LV PW:         1.20 cm   LV E/e' medial:  14.3 LV IVS:        1.10 cm   LV e' lateral:   6.30 cm/s LVOT diam:     2.00 cm   LV E/e' lateral: 12.4 LVOT Area:     3.14  cm  RIGHT VENTRICLE            IVC RV Basal diam:  3.00 cm    IVC diam: 1.30 cm RV S prime:     6.90 cm/s TAPSE (M-mode): 1.6 cm LEFT ATRIUM             Index LA diam:        3.40 cm 1.73 cm/m LA Vol (A2C):   51.7 ml 26.30 ml/m LA Vol (A4C):   52.7 ml 26.81 ml/m LA Biplane Vol: 52.6 ml 26.75 ml/m  AORTIC VALVE              PULMONIC VALVE AV Vmax:      113.00 cm/s PV Vmax:       0.68 m/s AV Vmean:     85.000 cm/s PV Peak grad:  1.8 mmHg AV VTI:       0.254 m AV Peak Grad: 5.1 mmHg AV Mean Grad: 3.0 mmHg  AORTA Ao Root diam: 3.50 cm Ao Asc diam:  3.70 cm Ao Arch diam: 3.6 cm MITRAL VALVE MV Area (PHT): 2.73 cm     SHUNTS MV Decel Time: 278 msec  Systemic Diam: 2.00 cm MV E velocity: 78.10 cm/s MV A velocity: 105.00 cm/s MV E/A ratio:  0.74 Arvilla Meres MD Electronically signed by Arvilla Meres MD Signature Date/Time: 09/30/2022/3:48:16 PM    Final    CARDIAC CATHETERIZATION  Result Date: 09/30/2022   Ramus-2 lesion is 75% stenosed.   Ramus-1 lesion is 80% stenosed.   Mid LAD lesion is 75% stenosed.   Ost LAD to Prox LAD lesion is 50% stenosed.   Ost Cx to Prox Cx lesion is 60% stenosed.   Mid LM to Ost LAD lesion is 40% stenosed.   Ost RCA to Prox RCA lesion is 50% stenosed.   RPDA lesion is 75% stenosed.   LV end diastolic pressure is normal.   There is no aortic valve stenosis.   Severe right subclavian tortuosity.  85 cm destination sheath not available today.  Right groin approach used. Moderate disease in the distal left main, ostial LAD and ostial circumflex.  Severe disease in the large ramus and mid LAD.  Will obtain surgical consultation.  I discussed the findings with the wife and specifically asked about the DNR status.  It was meant to be more related to 'he would not want to live on a vent if he had brain damage.' If he was not a candidate for CABG due to other comorbidities, could consider PCI of the mid LAD and medical therapy of the ramus vessel and other moderate disease.   CT  Angio Chest PE W and/or Wo Contrast  Result Date: 09/29/2022 CLINICAL DATA:  Pulmonary embolism (PE) suspected, low to intermediate prob, positive D-dimer EXAM: CT ANGIOGRAPHY CHEST WITH CONTRAST TECHNIQUE: Multidetector CT imaging of the chest was performed using the standard protocol during bolus administration of intravenous contrast. Multiplanar CT image reconstructions and MIPs were obtained to evaluate the vascular anatomy. RADIATION DOSE REDUCTION: This exam was performed according to the departmental dose-optimization program which includes automated exposure control, adjustment of the mA and/or kV according to patient size and/or use of iterative reconstruction technique. CONTRAST:  75mL OMNIPAQUE IOHEXOL 350 MG/ML SOLN COMPARISON:  11/26/2021 FINDINGS: Cardiovascular: Satisfactory opacification of the pulmonary arteries to the segmental level. No evidence of pulmonary embolism. Thoracic aorta is nonaneurysmal. Atherosclerotic vascular calcifications of the aorta and coronary arteries. Normal heart size. No pericardial effusion. Mediastinum/Nodes: No enlarged mediastinal, hilar, or axillary lymph nodes. Thyroid gland and trachea demonstrate no significant findings. Distal esophagus is mildly thickened. Lungs/Pleura: Passive atelectasis within the dependent lung fields. No focal consolidation. No pleural effusion or pneumothorax. Upper Abdomen: No acute abnormality. Musculoskeletal: No chest wall abnormality. No acute or significant osseous findings. Prior cement augmentation of the T5 vertebral body. Review of the MIP images confirms the above findings. IMPRESSION: 1. No evidence of pulmonary embolism or other acute intrathoracic findings. 2. Mild thickening of the distal esophagus, which can be seen in the setting of esophagitis. 3. Aortic and coronary artery atherosclerosis (ICD10-I70.0). Electronically Signed   By: Duanne Guess D.O.   On: 09/29/2022 09:15   DG Chest Port 1 View  Result Date:  09/29/2022 CLINICAL DATA:  Sepsis. EXAM: PORTABLE CHEST 1 VIEW COMPARISON:  11/24/2021 FINDINGS: The heart size and mediastinal contours are within normal limits. Both lungs are clear. Previous midthoracic spine vertebroplasty noted. IMPRESSION: No active disease. Electronically Signed   By: Danae Orleans M.D.   On: 09/29/2022 08:03    Recent Labs: Lab Results  Component Value Date   WBC 9.0 10/03/2022   HGB 13.4 10/03/2022  PLT 285 10/03/2022   NA 132 (L) 10/03/2022   K 3.8 10/03/2022   CL 100 10/03/2022   CO2 21 (L) 10/03/2022   GLUCOSE 205 (H) 10/03/2022   BUN 13 10/03/2022   CREATININE 0.79 10/03/2022   BILITOT 0.6 10/03/2022   ALKPHOS 65 10/03/2022   AST 25 10/03/2022   ALT 6 10/03/2022   PROT 6.8 10/03/2022   ALBUMIN 2.7 (L) 10/03/2022   CALCIUM 8.5 (L) 10/03/2022   GFRAA >60 07/05/2019   QFTBGOLDPLUS NEGATIVE 09/26/2022    Speciality Comments: No specialty comments available.  Procedures:  No procedures performed Allergies: Codeine, Penicillins, Prednisone, Wool alcohol [lanolin], Empagliflozin, Penicillin g, and Statins   Assessment / Plan:     Visit Diagnoses: Rheumatoid arthritis, involving unspecified site, unspecified whether rheumatoid factor present ([name] - Plan: XR Hand 2 View Right, XR Hand 2 View Left, Sedimentation rate, C-reactive protein, hydroxychloroquine (PLAQUENIL) 200 MG tablet, methotrexate (RHEUMATREX) 2.5 MG tablet, folic acid (FOLVITE) 1 MG tablet, DISCONTINUED: HUMIRA, 2 PEN, 40 MG/0.4ML PNKT  Significant pain complaint although there is not much peripheral joint synovitis appreciable on exam today.  Overall doing very poorly multifactorial with recent repeated falls significant deconditioning and currently complaining of upper airway symptoms he attributes to seasonal allergic rhinitis.  Checking sed rate and CRP as well for disease activity assessment.  X-rays of bilateral hands shows generalized osteoarthritis change only visible erosion in the left  fifth digit.  Plan to continue on his Humira and hydroxychloroquine we will resume methotrexate at 15 mg p.o. weekly and folic acid 2 mg daily.  Diabetic polyneuropathy associated with type 2 diabetes mellitus ([name]  Some peripheral pain in hands and feet may be associated with peripheral neuropathy also suspected as contribution with his instability falls and leg weakness.  Coronary artery disease due to lipid rich plaque  Would not be a good candidate for Jak inhibitor also not a good candidate for nonsteroidal anti-inflammatory medications as well as from long-term anticoagulation.  History of pulmonary embolism  On Eliquis anticoagulation for PE and DVT.  Does have some bruising associated with his falls but no major bleeding complication.  High risk medication use - Plan: CBC with Differential/Platelet, COMPLETE METABOLIC PANEL WITH GFR, QuantiFERON-TB Gold Plus  Checking CBC CMP and QuantiFERON for medication monitoring continuing on Humira and Plaquenil and resuming methotrexate treatment.  Reviewed risks of medications including cytopenias hepatotoxicity injection reactions infections malignancy and exacerbation of congestive heart failure.  Has been tolerating medication previously for a few years without incident.  Orders: Orders Placed This Encounter  Procedures   XR Hand 2 View Right   XR Hand 2 View Left   Sedimentation rate   C-reactive protein   CBC with Differential/Platelet   COMPLETE METABOLIC PANEL WITH GFR   QuantiFERON-TB Gold Plus   Meds ordered this encounter  Medications   DISCONTD: HUMIRA, 2 PEN, 40 MG/0.4ML PNKT    Sig: Inject 0.4 mLs into the muscle every 14 (fourteen) days.    Dispense:  2 each    Refill:  2   hydroxychloroquine (PLAQUENIL) 200 MG tablet    Sig: Take 1 tablet (200 mg total) by mouth daily.    Dispense:  90 tablet    Refill:  0   methotrexate (RHEUMATREX) 2.5 MG tablet    Sig: Take 6 tablets (15 mg total) by mouth once a week.  Caution:Chemotherapy. Protect from light.    Dispense:  72 tablet    Refill:  0   folic  acid (FOLVITE) 1 MG tablet    Sig: Take 1 tablet (1 mg total) by mouth daily.    Dispense:  90 tablet    Refill:  3     Follow-Up Instructions: Return in about 6 weeks (around 11/07/2022) for New pt RA on ADA/MTX/HCQ f/u 6wks.   Fuller Plan, MD  Note - This record has been created using [employer].  Chart creation errors have been sought, but may not always  have been located. Such creation errors do not reflect on  the standard of medical care.

## 2022-09-26 ENCOUNTER — Ambulatory Visit: Payer: Medicare HMO | Attending: Internal Medicine | Admitting: Internal Medicine

## 2022-09-26 ENCOUNTER — Encounter: Payer: Self-pay | Admitting: Internal Medicine

## 2022-09-26 ENCOUNTER — Ambulatory Visit: Payer: Medicare HMO

## 2022-09-26 ENCOUNTER — Telehealth: Payer: Self-pay | Admitting: Pharmacist

## 2022-09-26 ENCOUNTER — Ambulatory Visit (INDEPENDENT_AMBULATORY_CARE_PROVIDER_SITE_OTHER): Payer: Medicare HMO

## 2022-09-26 VITALS — BP 127/75 | HR 111 | Resp 12 | Ht 70.0 in | Wt 173.0 lb

## 2022-09-26 DIAGNOSIS — M069 Rheumatoid arthritis, unspecified: Secondary | ICD-10-CM | POA: Diagnosis not present

## 2022-09-26 DIAGNOSIS — Z79899 Other long term (current) drug therapy: Secondary | ICD-10-CM

## 2022-09-26 DIAGNOSIS — Z86711 Personal history of pulmonary embolism: Secondary | ICD-10-CM

## 2022-09-26 DIAGNOSIS — I2583 Coronary atherosclerosis due to lipid rich plaque: Secondary | ICD-10-CM | POA: Diagnosis not present

## 2022-09-26 DIAGNOSIS — M79641 Pain in right hand: Secondary | ICD-10-CM

## 2022-09-26 DIAGNOSIS — M79642 Pain in left hand: Secondary | ICD-10-CM

## 2022-09-26 DIAGNOSIS — I2510 Atherosclerotic heart disease of native coronary artery without angina pectoris: Secondary | ICD-10-CM

## 2022-09-26 DIAGNOSIS — E1142 Type 2 diabetes mellitus with diabetic polyneuropathy: Secondary | ICD-10-CM | POA: Diagnosis not present

## 2022-09-26 LAB — CBC WITH DIFFERENTIAL/PLATELET
HCT: 46.3 % (ref 38.5–50.0)
Hemoglobin: 15.9 g/dL (ref 13.2–17.1)
Lymphs Abs: 941 cells/uL (ref 850–3900)
MCH: 31.5 pg (ref 27.0–33.0)
MCHC: 34.3 g/dL (ref 32.0–36.0)
Monocytes Relative: 6.7 %
Neutrophils Relative %: 88.4 %
RDW: 12.8 % (ref 11.0–15.0)

## 2022-09-26 MED ORDER — FOLIC ACID 1 MG PO TABS
1.0000 mg | ORAL_TABLET | Freq: Every day | ORAL | 3 refills | Status: AC
Start: 2022-09-26 — End: ?

## 2022-09-26 MED ORDER — METHOTREXATE SODIUM 2.5 MG PO TABS
15.0000 mg | ORAL_TABLET | ORAL | 0 refills | Status: AC
Start: 2022-09-26 — End: ?

## 2022-09-26 MED ORDER — HUMIRA (2 PEN) 40 MG/0.4ML ~~LOC~~ AJKT
0.4000 mL | AUTO-INJECTOR | SUBCUTANEOUS | 2 refills | Status: DC
Start: 2022-09-26 — End: 2022-09-30

## 2022-09-26 MED ORDER — HYDROXYCHLOROQUINE SULFATE 200 MG PO TABS
200.0000 mg | ORAL_TABLET | Freq: Every day | ORAL | 0 refills | Status: AC
Start: 2022-09-26 — End: ?

## 2022-09-26 NOTE — Telephone Encounter (Addendum)
Pending OV note from today, please start Humira BIV. It appears he was previously on Humira   Chesley Mires, PharmD, MPH, BCPS, CPP Clinical Pharmacist (Rheumatology and Pulmonology)  ----- Message from Metta Clines, RT sent at 09/26/2022  9:38 AM EDT ----- Regarding: New Start Humira

## 2022-09-27 LAB — COMPLETE METABOLIC PANEL WITH GFR
ALT: 13 U/L (ref 9–46)
Chloride: 101 mmol/L (ref 98–110)
Globulin: 3.7 g/dL (calc) (ref 1.9–3.7)
Potassium: 3.9 mmol/L (ref 3.5–5.3)
Sodium: 135 mmol/L (ref 135–146)
Total Bilirubin: 0.8 mg/dL (ref 0.2–1.2)

## 2022-09-27 LAB — CBC WITH DIFFERENTIAL/PLATELET
Absolute Monocytes: 1400 cells/uL — ABNORMAL HIGH (ref 200–950)
Basophils Relative: 0.3 %
MPV: 10.7 fL (ref 7.5–12.5)
RBC: 5.05 10*6/uL (ref 4.20–5.80)
Total Lymphocyte: 4.5 %
WBC: 20.9 10*3/uL — ABNORMAL HIGH (ref 3.8–10.8)

## 2022-09-27 LAB — C-REACTIVE PROTEIN: CRP: 77.9 mg/L — ABNORMAL HIGH (ref <8.0)

## 2022-09-28 LAB — COMPLETE METABOLIC PANEL WITH GFR
AG Ratio: 1.2 (calc) (ref 1.0–2.5)
AST: 23 U/L (ref 10–35)
Albumin: 4.3 g/dL (ref 3.6–5.1)
Alkaline phosphatase (APISO): 80 U/L (ref 35–144)
BUN: 20 mg/dL (ref 7–25)
CO2: 24 mmol/L (ref 20–32)
Calcium: 8.9 mg/dL (ref 8.6–10.3)
Creat: 0.91 mg/dL (ref 0.70–1.28)
Glucose, Bld: 182 mg/dL — ABNORMAL HIGH (ref 65–99)
Total Protein: 8 g/dL (ref 6.1–8.1)
eGFR: 88 mL/min/{1.73_m2} (ref >=60)

## 2022-09-28 LAB — CBC WITH DIFFERENTIAL/PLATELET
Basophils Absolute: 63 cells/uL (ref 0–200)
Eosinophils Absolute: 21 cells/uL (ref 15–500)
Eosinophils Relative: 0.1 %
MCV: 91.7 fL (ref 80.0–100.0)
Neutro Abs: 18476 cells/uL — ABNORMAL HIGH (ref 1500–7800)
Platelets: 301 10*3/uL (ref 140–400)

## 2022-09-28 LAB — QUANTIFERON-TB GOLD PLUS
Mitogen-NIL: 1.81 IU/mL
NIL: 0.02 IU/mL
QuantiFERON-TB Gold Plus: NEGATIVE
TB1-NIL: 0 IU/mL
TB2-NIL: 0 IU/mL

## 2022-09-28 LAB — SEDIMENTATION RATE: Sed Rate: 38 mm/h — ABNORMAL HIGH (ref 0–20)

## 2022-09-29 ENCOUNTER — Inpatient Hospital Stay (HOSPITAL_COMMUNITY)
Admission: EM | Admit: 2022-09-29 | Discharge: 2022-10-03 | DRG: 322 | Disposition: A | Discharge status: Skilled Nursing Facility | Payer: Medicare HMO | Attending: Internal Medicine | Admitting: Internal Medicine

## 2022-09-29 ENCOUNTER — Emergency Department (HOSPITAL_COMMUNITY): Payer: Medicare HMO

## 2022-09-29 ENCOUNTER — Other Ambulatory Visit: Payer: Self-pay

## 2022-09-29 DIAGNOSIS — Z794 Long term (current) use of insulin: Secondary | ICD-10-CM | POA: Diagnosis not present

## 2022-09-29 DIAGNOSIS — M6281 Muscle weakness (generalized): Secondary | ICD-10-CM | POA: Diagnosis not present

## 2022-09-29 DIAGNOSIS — E876 Hypokalemia: Secondary | ICD-10-CM | POA: Diagnosis present

## 2022-09-29 DIAGNOSIS — I2510 Atherosclerotic heart disease of native coronary artery without angina pectoris: Secondary | ICD-10-CM | POA: Diagnosis present

## 2022-09-29 DIAGNOSIS — R2689 Other abnormalities of gait and mobility: Secondary | ICD-10-CM | POA: Diagnosis not present

## 2022-09-29 DIAGNOSIS — I214 Non-ST elevation (NSTEMI) myocardial infarction: Principal | ICD-10-CM | POA: Diagnosis present

## 2022-09-29 DIAGNOSIS — Z888 Allergy status to other drugs, medicaments and biological substances status: Secondary | ICD-10-CM | POA: Diagnosis not present

## 2022-09-29 DIAGNOSIS — T45516A Underdosing of anticoagulants, initial encounter: Secondary | ICD-10-CM | POA: Diagnosis present

## 2022-09-29 DIAGNOSIS — Z880 Allergy status to penicillin: Secondary | ICD-10-CM

## 2022-09-29 DIAGNOSIS — Z91148 Patient's other noncompliance with medication regimen for other reason: Secondary | ICD-10-CM | POA: Diagnosis not present

## 2022-09-29 DIAGNOSIS — Z8616 Personal history of COVID-19: Secondary | ICD-10-CM | POA: Diagnosis not present

## 2022-09-29 DIAGNOSIS — Z955 Presence of coronary angioplasty implant and graft: Secondary | ICD-10-CM

## 2022-09-29 DIAGNOSIS — R2681 Unsteadiness on feet: Secondary | ICD-10-CM | POA: Diagnosis not present

## 2022-09-29 DIAGNOSIS — R000 Tachycardia, unspecified: Secondary | ICD-10-CM | POA: Diagnosis not present

## 2022-09-29 DIAGNOSIS — Z8249 Family history of ischemic heart disease and other diseases of the circulatory system: Secondary | ICD-10-CM

## 2022-09-29 DIAGNOSIS — K209 Esophagitis, unspecified without bleeding: Secondary | ICD-10-CM | POA: Diagnosis present

## 2022-09-29 DIAGNOSIS — R0789 Other chest pain: Secondary | ICD-10-CM | POA: Diagnosis not present

## 2022-09-29 DIAGNOSIS — M069 Rheumatoid arthritis, unspecified: Secondary | ICD-10-CM | POA: Diagnosis present

## 2022-09-29 DIAGNOSIS — Z66 Do not resuscitate: Secondary | ICD-10-CM | POA: Diagnosis present

## 2022-09-29 DIAGNOSIS — E785 Hyperlipidemia, unspecified: Secondary | ICD-10-CM | POA: Diagnosis not present

## 2022-09-29 DIAGNOSIS — Y92009 Unspecified place in unspecified non-institutional (private) residence as the place of occurrence of the external cause: Secondary | ICD-10-CM

## 2022-09-29 DIAGNOSIS — Z79620 Long term (current) use of immunosuppressive biologic: Secondary | ICD-10-CM

## 2022-09-29 DIAGNOSIS — Z885 Allergy status to narcotic agent status: Secondary | ICD-10-CM | POA: Diagnosis not present

## 2022-09-29 DIAGNOSIS — D72829 Elevated white blood cell count, unspecified: Secondary | ICD-10-CM | POA: Diagnosis not present

## 2022-09-29 DIAGNOSIS — E1165 Type 2 diabetes mellitus with hyperglycemia: Secondary | ICD-10-CM | POA: Diagnosis not present

## 2022-09-29 DIAGNOSIS — E8809 Other disorders of plasma-protein metabolism, not elsewhere classified: Secondary | ICD-10-CM | POA: Diagnosis present

## 2022-09-29 DIAGNOSIS — Z7901 Long term (current) use of anticoagulants: Secondary | ICD-10-CM

## 2022-09-29 DIAGNOSIS — E871 Hypo-osmolality and hyponatremia: Secondary | ICD-10-CM | POA: Diagnosis not present

## 2022-09-29 DIAGNOSIS — I4891 Unspecified atrial fibrillation: Secondary | ICD-10-CM | POA: Diagnosis not present

## 2022-09-29 DIAGNOSIS — R778 Other specified abnormalities of plasma proteins: Secondary | ICD-10-CM | POA: Diagnosis not present

## 2022-09-29 DIAGNOSIS — I252 Old myocardial infarction: Secondary | ICD-10-CM

## 2022-09-29 DIAGNOSIS — R079 Chest pain, unspecified: Principal | ICD-10-CM

## 2022-09-29 DIAGNOSIS — Z86718 Personal history of other venous thrombosis and embolism: Secondary | ICD-10-CM

## 2022-09-29 DIAGNOSIS — R7989 Other specified abnormal findings of blood chemistry: Secondary | ICD-10-CM | POA: Diagnosis present

## 2022-09-29 DIAGNOSIS — R41841 Cognitive communication deficit: Secondary | ICD-10-CM | POA: Diagnosis not present

## 2022-09-29 DIAGNOSIS — Z86711 Personal history of pulmonary embolism: Secondary | ICD-10-CM | POA: Diagnosis not present

## 2022-09-29 DIAGNOSIS — R1312 Dysphagia, oropharyngeal phase: Secondary | ICD-10-CM | POA: Diagnosis not present

## 2022-09-29 DIAGNOSIS — I10 Essential (primary) hypertension: Secondary | ICD-10-CM | POA: Diagnosis present

## 2022-09-29 DIAGNOSIS — I700 Atherosclerosis of aorta: Secondary | ICD-10-CM | POA: Diagnosis not present

## 2022-09-29 DIAGNOSIS — J9811 Atelectasis: Secondary | ICD-10-CM | POA: Diagnosis not present

## 2022-09-29 DIAGNOSIS — Z79899 Other long term (current) drug therapy: Secondary | ICD-10-CM

## 2022-09-29 DIAGNOSIS — E1142 Type 2 diabetes mellitus with diabetic polyneuropathy: Secondary | ICD-10-CM | POA: Diagnosis not present

## 2022-09-29 DIAGNOSIS — Z803 Family history of malignant neoplasm of breast: Secondary | ICD-10-CM

## 2022-09-29 DIAGNOSIS — Z808 Family history of malignant neoplasm of other organs or systems: Secondary | ICD-10-CM

## 2022-09-29 DIAGNOSIS — I201 Angina pectoris with documented spasm: Secondary | ICD-10-CM | POA: Diagnosis present

## 2022-09-29 DIAGNOSIS — A419 Sepsis, unspecified organism: Secondary | ICD-10-CM | POA: Diagnosis not present

## 2022-09-29 DIAGNOSIS — R531 Weakness: Secondary | ICD-10-CM | POA: Diagnosis not present

## 2022-09-29 DIAGNOSIS — Z9049 Acquired absence of other specified parts of digestive tract: Secondary | ICD-10-CM

## 2022-09-29 DIAGNOSIS — Z7401 Bed confinement status: Secondary | ICD-10-CM | POA: Diagnosis not present

## 2022-09-29 DIAGNOSIS — R296 Repeated falls: Secondary | ICD-10-CM | POA: Diagnosis present

## 2022-09-29 DIAGNOSIS — I959 Hypotension, unspecified: Secondary | ICD-10-CM | POA: Diagnosis not present

## 2022-09-29 LAB — RESPIRATORY PANEL BY PCR

## 2022-09-29 LAB — CBC WITH DIFFERENTIAL/PLATELET
Abs Immature Granulocytes: 0.04 10*3/uL (ref 0.00–0.07)
Basophils Absolute: 0 10*3/uL (ref 0.0–0.1)
Basophils Relative: 0 %
Eosinophils Absolute: 0 10*3/uL (ref 0.0–0.5)
Eosinophils Relative: 0 %
HCT: 42.5 % (ref 39.0–52.0)
Hemoglobin: 14.8 g/dL (ref 13.0–17.0)
Immature Granulocytes: 0 %
Lymphocytes Relative: 8 %
Lymphs Abs: 0.8 10*3/uL (ref 0.7–4.0)
MCH: 31.5 pg (ref 26.0–34.0)
MCHC: 34.8 g/dL (ref 30.0–36.0)
MCV: 90.4 fL (ref 80.0–100.0)
Monocytes Absolute: 0.5 10*3/uL (ref 0.1–1.0)
Monocytes Relative: 6 %
Neutro Abs: 8.1 10*3/uL — ABNORMAL HIGH (ref 1.7–7.7)
Neutrophils Relative %: 86 %
Platelets: 277 10*3/uL (ref 150–400)
RBC: 4.7 MIL/uL (ref 4.22–5.81)
RDW: 13.1 % (ref 11.5–15.5)
WBC: 9.5 10*3/uL (ref 4.0–10.5)
nRBC: 0 % (ref 0.0–0.2)

## 2022-09-29 LAB — COMPREHENSIVE METABOLIC PANEL
ALT: 19 U/L (ref 0–44)
AST: 22 U/L (ref 15–41)
Albumin: 3.1 g/dL — ABNORMAL LOW (ref 3.5–5.0)
Alkaline Phosphatase: 59 U/L (ref 38–126)
Anion gap: 9 (ref 5–15)
BUN: 21 mg/dL (ref 8–23)
CO2: 24 mmol/L (ref 22–32)
Calcium: 8.5 mg/dL — ABNORMAL LOW (ref 8.9–10.3)
Chloride: 101 mmol/L (ref 98–111)
Creatinine, Ser: 0.9 mg/dL (ref 0.61–1.24)
GFR, Estimated: >60 mL/min (ref >60)
Glucose, Bld: 185 mg/dL — ABNORMAL HIGH (ref 70–99)
Potassium: 3.4 mmol/L — ABNORMAL LOW (ref 3.5–5.1)
Sodium: 134 mmol/L — ABNORMAL LOW (ref 135–145)
Total Bilirubin: 0.6 mg/dL (ref 0.3–1.2)
Total Protein: 7.4 g/dL (ref 6.5–8.1)

## 2022-09-29 LAB — CBG MONITORING, ED
Glucose-Capillary: 129 mg/dL — ABNORMAL HIGH (ref 70–99)
Glucose-Capillary: 107 mg/dL — ABNORMAL HIGH (ref 70–99)

## 2022-09-29 LAB — HEPARIN LEVEL (UNFRACTIONATED): Heparin Unfractionated: <0.1 IU/mL — ABNORMAL LOW (ref 0.30–0.70)

## 2022-09-29 LAB — APTT
aPTT: 26 seconds (ref 24–36)
aPTT: 66 seconds — ABNORMAL HIGH (ref 24–36)

## 2022-09-29 LAB — TROPONIN I (HIGH SENSITIVITY)
Troponin I (High Sensitivity): 534 ng/L (ref <18)
Troponin I (High Sensitivity): 357 ng/L (ref <18)

## 2022-09-29 LAB — D-DIMER, QUANTITATIVE: D-Dimer, Quant: 0.97 ug/mL-FEU — ABNORMAL HIGH (ref 0.00–0.50)

## 2022-09-29 LAB — HEMOGLOBIN A1C
Hgb A1c MFr Bld: 6.7 % — ABNORMAL HIGH (ref 4.8–5.6)
Mean Plasma Glucose: 145.59 mg/dL

## 2022-09-29 LAB — LACTIC ACID, PLASMA
Lactic Acid, Venous: 1.9 mmol/L (ref 0.5–1.9)
Lactic Acid, Venous: 1.5 mmol/L (ref 0.5–1.9)

## 2022-09-29 LAB — PROTIME-INR
INR: 1 (ref 0.8–1.2)
Prothrombin Time: 13.3 seconds (ref 11.4–15.2)

## 2022-09-29 LAB — TSH: TSH: 0.13 u[IU]/mL — ABNORMAL LOW (ref 0.350–4.500)

## 2022-09-29 LAB — BRAIN NATRIURETIC PEPTIDE: B Natriuretic Peptide: 195.4 pg/mL — ABNORMAL HIGH (ref 0.0–100.0)

## 2022-09-29 MED ORDER — SODIUM CHLORIDE 0.9 % WEIGHT BASED INFUSION: 3.0000 mL/kg/h | INTRAVENOUS | Status: DC

## 2022-09-29 MED ORDER — SODIUM CHLORIDE 0.9 % IV SOLN: 250.0000 mL | INTRAVENOUS | Status: DC | PRN

## 2022-09-29 MED ORDER — HEPARIN BOLUS VIA INFUSION
4000.0000 [IU] | Freq: Once | INTRAVENOUS | Status: AC
  Administered 2022-09-29: 4000 [IU] via INTRAVENOUS
  Filled 2022-09-29: qty 4000

## 2022-09-29 MED ORDER — ONDANSETRON HCL 4 MG/2ML IJ SOLN
4.0000 mg | Freq: Once | INTRAMUSCULAR | Status: AC
  Administered 2022-09-29: 4 mg via INTRAVENOUS
  Filled 2022-09-29: qty 2

## 2022-09-29 MED ORDER — NITROGLYCERIN 0.4 MG SL SUBL
0.4000 mg | SUBLINGUAL_TABLET | SUBLINGUAL | Status: DC | PRN
  Filled 2022-09-29: qty 1

## 2022-09-29 MED ORDER — HEPARIN (PORCINE) 25000 UT/250ML-% IV SOLN
1250.0000 [IU]/h | INTRAVENOUS | Status: DC
  Administered 2022-09-29: 950 [IU]/h via INTRAVENOUS
  Administered 2022-09-30: 1250 [IU]/h via INTRAVENOUS
  Filled 2022-09-29 (×2): qty 250

## 2022-09-29 MED ORDER — LACTATED RINGERS IV BOLUS (SEPSIS)
1000.0000 mL | Freq: Once | INTRAVENOUS | Status: AC
  Administered 2022-09-29: 1000 mL via INTRAVENOUS

## 2022-09-29 MED ORDER — MECLIZINE HCL 25 MG PO TABS: 25.0000 mg | ORAL_TABLET | Freq: Three times a day (TID) | ORAL | Status: DC | PRN

## 2022-09-29 MED ORDER — SODIUM CHLORIDE 0.9 % WEIGHT BASED INFUSION
1.0000 mL/kg/h | INTRAVENOUS | Status: DC
  Administered 2022-09-30: 1 mL/kg/h via INTRAVENOUS

## 2022-09-29 MED ORDER — ASPIRIN 325 MG PO TABS: 325.0000 mg | ORAL_TABLET | Freq: Every day | ORAL | Status: DC

## 2022-09-29 MED ORDER — INSULIN ASPART 100 UNIT/ML IJ SOLN
0.0000 [IU] | Freq: Three times a day (TID) | INTRAMUSCULAR | Status: DC
  Administered 2022-09-30: 1 [IU] via SUBCUTANEOUS

## 2022-09-29 MED ORDER — AMLODIPINE BESYLATE 5 MG PO TABS
5.0000 mg | ORAL_TABLET | Freq: Every day | ORAL | Status: DC
  Administered 2022-09-29 – 2022-10-03 (×5): 5 mg via ORAL
  Filled 2022-09-29 (×5): qty 1

## 2022-09-29 MED ORDER — ASPIRIN 325 MG PO TABS
325.0000 mg | ORAL_TABLET | Freq: Every day | ORAL | Status: DC
  Administered 2022-09-29: 325 mg via ORAL
  Filled 2022-09-29: qty 1

## 2022-09-29 MED ORDER — SODIUM CHLORIDE 0.9% FLUSH
3.0000 mL | Freq: Two times a day (BID) | INTRAVENOUS | Status: DC
  Administered 2022-10-01 – 2022-10-02 (×2): 3 mL via INTRAVENOUS

## 2022-09-29 MED ORDER — ASPIRIN 81 MG PO CHEW
81.0000 mg | CHEWABLE_TABLET | ORAL | Status: AC
  Administered 2022-09-30: 81 mg via ORAL
  Filled 2022-09-29: qty 1

## 2022-09-29 MED ORDER — IOHEXOL 350 MG/ML SOLN
75.0000 mL | Freq: Once | INTRAVENOUS | Status: AC | PRN
  Administered 2022-09-29: 75 mL via INTRAVENOUS

## 2022-09-29 MED ORDER — SODIUM CHLORIDE 0.9% FLUSH: 3.0000 mL | INTRAVENOUS | Status: DC | PRN

## 2022-09-29 MED ORDER — HEPARIN BOLUS VIA INFUSION
2000.0000 [IU] | Freq: Once | INTRAVENOUS | Status: AC
  Administered 2022-09-29: 2000 [IU] via INTRAVENOUS
  Filled 2022-09-29: qty 2000

## 2022-09-29 MED ORDER — FENTANYL CITRATE PF 50 MCG/ML IJ SOSY
50.0000 ug | PREFILLED_SYRINGE | Freq: Once | INTRAMUSCULAR | Status: AC
  Administered 2022-09-29: 50 ug via INTRAVENOUS
  Filled 2022-09-29: qty 1

## 2022-09-29 MED ORDER — MIRABEGRON ER 25 MG PO TB24
25.0000 mg | ORAL_TABLET | Freq: Every day | ORAL | Status: DC
  Administered 2022-09-29 – 2022-10-03 (×5): 25 mg via ORAL
  Filled 2022-09-29 (×5): qty 1

## 2022-09-29 MED ORDER — POTASSIUM CHLORIDE CRYS ER 20 MEQ PO TBCR
40.0000 meq | EXTENDED_RELEASE_TABLET | Freq: Once | ORAL | Status: AC
  Administered 2022-09-29: 40 meq via ORAL
  Filled 2022-09-29: qty 2

## 2022-09-29 MED ORDER — ACETAMINOPHEN 325 MG PO TABS
650.0000 mg | ORAL_TABLET | ORAL | Status: DC | PRN
  Administered 2022-09-30 – 2022-10-01 (×3): 650 mg via ORAL
  Filled 2022-09-29 (×3): qty 2

## 2022-09-29 MED ORDER — HYDROXYCHLOROQUINE SULFATE 200 MG PO TABS
200.0000 mg | ORAL_TABLET | Freq: Every day | ORAL | Status: DC
  Administered 2022-09-29 – 2022-10-03 (×5): 200 mg via ORAL
  Filled 2022-09-29 (×5): qty 1

## 2022-09-29 MED ORDER — CARBIDOPA-LEVODOPA 25-100 MG PO TABS
1.0000 | ORAL_TABLET | Freq: Three times a day (TID) | ORAL | Status: DC
  Administered 2022-09-29 – 2022-10-03 (×13): 1 via ORAL
  Filled 2022-09-29 (×15): qty 1

## 2022-09-29 MED ORDER — FOLIC ACID 1 MG PO TABS
1.0000 mg | ORAL_TABLET | Freq: Every day | ORAL | Status: DC
  Administered 2022-09-29 – 2022-10-03 (×5): 1 mg via ORAL
  Filled 2022-09-29 (×5): qty 1

## 2022-09-29 NOTE — H&P (Signed)
History and Physical    TRUST LEH ZOX:[id number] DOB: February 26, 1946 DOA: 09/29/2022  PCP: Irena Reichmann, DO   Patient coming from: home Chief Complaint  Patient presents with   Chest Pain     HPI: William Hunter is 77 year old male with history of DVT/PE in 2021, has not had Eliquis since 2 weeks-prescription expired, history of CAD/coronary artery spasm had cardiac cath >>> vasospasm, hypertension, migraine diabetes mellitus,rheumatoid arthritis, slipped vertebral cervical disc presenting with chest pain over the last few weeks. He woke up from sleep earlier this a.m. and episode of chest pain, his usual angina with associated sweating lasting for an hour and resolved.  Pain lasted for 2 hours today and came to ED,  In ED temp 100.9, heart rate 113 BP in 140s to 160s systolic, saturating on room air Labs showed mild hyponatremia, hypokalemia with a stable renal function, troponin elevated at 534 357>, lactic acid 1.9, normal CBC, D-dimer 0.97.Reviewed chest x-ray clear.  CT angio chest no evidence of PE or acute intrathoracic finding, possible esophagitis, aortic and coronary artery atherosclerosis noted Cardiology was consulted, aspirin 81 placed on heparin drip and admission was requested for further management. He currently feels terrible, had chills and headaches but no chest pain now. Patient otherwise denies any nausea, vomiting, shortness of breath, focal weakness, numbness tingling, speech difficulties  Patient reports he believed his wife has called his doctor for refills of his Eliquis on Monday, reports he has had 3 episodes of blood clot.  Assessment/Plan  Chest pain Elevated Troponin: concerning for nstemi CAD-coronary artery atherosclerosis-coronary spasm history: Cardio consulted.  Now on heparin drip, obtain echocardiogram duplex of the leg trend troponin  Esophagitis per CT scan:add PPI  Low-grade fever: Chest x-ray unremarkable, check UA, respiratory virus  panel  Hypokalemia replete po  Hypertension: BP stable.Resume home meds-Cardizem, further meds as per cardiology as above  Dyslipidemia: Check fasting lipid profile   History of pulmonary embolism/VTE/ positive D-dimer:Now on heparin drip, d dimer slightly up. Will get duplex scan.  T2DM: add ssi, check hba1c.  RA: Seems he is on Humira and Plaquenil, Foley cath will resume after med rec  Code status:DNR confirmed with patient.  Med rec pending  Body mass index is 24.82 kg/m.   Severity of Illness: The appropriate patient status for this patient is OBSERVATION. Observation status is judged to be reasonable and necessary in order to provide the required intensity of service to ensure the patient's safety. The patient's presenting symptoms, physical exam findings, and initial radiographic and laboratory data in the context of their medical condition is felt to place them at decreased risk for further clinical deterioration. Furthermore, it is anticipated that the patient will be medically stable for discharge from the hospital within 2 midnights of admission.    DVT prophylaxis: heparin Code Status:   Code Status: DNR  Family Communication: Admission, patients condition and plan of care including tests being ordered have been discussed with the patient  who indicate understanding and agree with the plan and Code Status.  Consults called:  Cardiology  Review of Systems: All systems were reviewed and were negative except as mentioned in HPI above. Negative for fever Negative for bleeding Negative for shortness of breath  Past Medical History:  Diagnosis Date   Anginal pain    Arthritis    "fingers" (03/09/2012)   Cold feet    "from my diabetes; they stay cold" (03/09/2012)   Coronary artery disease    Coronary artery spasm  since age 40 Dr Aleen Campi   COVID 07/06/2019   hospitalizied   Dyslipidemia    Femoral DVT (deep venous thrombosis)    left   Headache(784.0)    Hx  of gallstones    Dr Derrell Lolling lap cholecystectomy   Hx of plastic surgery    plastic surgery following a fall off a loading dock ,lost  all his upper teeth    Hypertension    MI, old    at age 25   Migraines    Pneumonia ~ 1962; 1970's   "double"; "regular" (03/09/2012)   Rheumatoid arthritis    Dr Dierdre Forth    Slipped cervical disc 06/10/2006   Dr Patsi Sears in the past/ Dr Larita Fife , chiropractor in 2008   Thyroid disease    Dr Lucianne Muss   Type II diabetes mellitus     Past Surgical History:  Procedure Laterality Date   CARDIAC CATHETERIZATION     x 3 with NO stents    CHOLECYSTECTOMY  03/11/2012   Procedure: LAPAROSCOPIC CHOLECYSTECTOMY WITH INTRAOPERATIVE CHOLANGIOGRAM;  Surgeon: Ernestene Mention, MD;  Location: [facility] OR;  Service: General;  Laterality: N/A;   KYPHOPLASTY N/A 05/30/2021   Procedure: THORACIC FIVE KYPHOPLASTY;  Surgeon: Coletta Memos, MD;  Location: [facility] OR;  Service: Neurosurgery;  Laterality: N/A;  THORACIC FIVE KYPHOPLASTY   PENILE PROSTHESIS IMPLANT  1983     reports that he has never smoked. He has never been exposed to tobacco smoke. He has never used smokeless tobacco. He reports that he does not drink alcohol and does not use drugs.  Allergies  Allergen Reactions   Codeine Swelling    Other reaction(s): rash   Penicillins Hives and Swelling    Has patient had a PCN reaction causing immediate rash, facial/tongue/throat swelling, SOB or lightheadedness with hypotension: Yes Has patient had a PCN reaction causing severe rash involving mucus membranes or skin necrosis: No Has patient had a PCN reaction that required hospitalization No Has patient had a PCN reaction occurring within the last 10 years: No If all of the above answers are "NO", then may proceed with Cephalosporin use.    Prednisone Hives    Other reaction(s): hives   Wool Alcohol [Lanolin] Hives   Empagliflozin Nausea And Vomiting    Other reaction(s): nausea and vomiting   Penicillin G     Other  reaction(s): rash   Statins     Other reaction(s): myopathy   Family History  Problem Relation Age of Onset   Heart failure Mother    Cancer - Other Mother        breast   Heart disease Father    Cancer - Other Sister        tongue cancer   Heart attack Sister    Prior to Admission medications   Medication Sig Start Date End Date Taking? Authorizing Provider  apixaban (ELIQUIS) 5 MG TABS tablet Take 10 mg p.o. twice daily, on  Wed 12/05/21 at 1000-switch to 5 mg p.o. twice daily. 11/29/21   Ghimire, Werner Lean, MD  BD PEN NEEDLE MICRO U/F 32G X 6 MM MISC Inject into the skin 3 (three) times daily. 09/16/22   [provider]  BD PEN NEEDLE NANO 2ND GEN 32G X 4 MM MISC  06/07/22   [provider]  carbidopa-levodopa (SINEMET IR) 25-100 MG tablet Take 1 tablet by mouth 3 (three) times daily. 04/10/22   Levert Feinstein, MD  Continuous Glucose Sensor (FREESTYLE LIBRE 2 SENSOR) MISC Place onto  the skin. 09/18/22   [provider]  diltiazem (TIAZAC) 300 MG 24 hr capsule Take 1 capsule (300 mg total) by mouth daily. Patient not taking: Reported on 09/26/2022 12/12/21   Jake Bathe, MD  folic acid (FOLVITE) 1 MG tablet Take 1 tablet (1 mg total) by mouth daily. 09/26/22   Rice, Jamesetta Orleans, MD  GVOKE HYPOPEN 2-PACK 1 MG/0.2ML SOAJ Inject into the skin. 04/11/22   [provider]  HUMIRA, 2 PEN, 40 MG/0.4ML PNKT Inject 0.4 mLs into the muscle every 14 (fourteen) days. 09/26/22   Rice, Jamesetta Orleans, MD  hydroxychloroquine (PLAQUENIL) 200 MG tablet Take 1 tablet (200 mg total) by mouth daily. 09/26/22   Fuller Plan, MD  ibuprofen (ADVIL) 200 MG tablet Take 200 mg by mouth daily.    [provider]  insulin aspart (NOVOLOG) 100 UNIT/ML injection 0-5 Units, Subcutaneous, Daily at bedtime: HS scale CBG < 70: implement hypoglycemia measures CBG 70 - 120: 0 units CBG 121 - 150: 0 units CBG 151 - 200: 0 units CBG 201 - 250: 2 units CBG 251 - 300: 3 units CBG 301 -  350: 4 units CBG 351 - 400: 5 units CBG > 400: call MD and obtain STAT lab verification 11/29/21   Maretta Bees, MD  insulin aspart (NOVOLOG) 100 UNIT/ML injection 0-15 Units, Subcutaneous, 3 times daily with meals CBG < 70: Implement Hypoglycemia Standing Orders and refer to Hypoglycemia Standing Orders sidebar report CBG 70 - 120: 0 units CBG 121 - 150: 2 units CBG 151 - 200: 3 units CBG 201 - 250: 5 units CBG 251 - 300: 8 units CBG 301 - 350: 11 units CBG 351 - 400: 15 units CBG > 400: call MD and obtain STAT lab verification 11/29/21   Maretta Bees, MD  meclizine (ANTIVERT) 25 MG tablet Take 25 mg by mouth 3 (three) times daily as needed for dizziness.    [provider]  methotrexate (RHEUMATREX) 2.5 MG tablet Take 6 tablets (15 mg total) by mouth once a week. Caution:Chemotherapy. Protect from light. 09/26/22   Fuller Plan, MD  MYRBETRIQ 25 MG TB24 tablet Take 25 mg by mouth daily. 09/12/22   [provider]  Study - ORION 4 - inclisiran 300 mg/1.14mL or placebo SQ injection (PI-Stuckey) Inject 1.5 mLs (300 mg total) into the skin every 6 (six) months. Patient not taking: Reported on 09/26/2022 09/06/21   Herby Abraham, MD   Physical Exam: Vitals:   09/29/22 0845 09/29/22 0915 09/29/22 0930 09/29/22 1030  BP:    135/69  Pulse: (!) 119  (!) 117 (!) 114  Resp: (!) 24  (!) 23 (!) 23  Temp:      TempSrc:      SpO2: 98% 98% 98% 96%  Weight:      Height:      General exam: AAOx3,NAD, weak appearing. HEENT:Oral mucosa moist, Ear/Nose WNL grossly, dentition normal. Respiratory system: bilaterally clear,no wheezing or crackles,no use of accessory muscle Cardiovascular system: S1 & S2 +, No JVD. Gastrointestinal system: Abdomen soft, NT,ND, BS+ Nervous System:Alert, awake, moving extremities and grossly nonfocal Extremities: No edema, distal peripheral pulses palpable.  Skin: No rashes,no icterus. MSK: Normal muscle bulk,tone, power   Labs on Admission: I  have personally reviewed following labs and imaging studies  CBC: Recent Labs  Lab 09/26/22 0939 09/29/22 0654  WBC 20.9* 9.5  NEUTROABS 18,476* 8.1*  HGB 15.9 14.8  HCT 46.3 42.5  MCV 91.7  90.4  PLT 301 277   Basic Metabolic Panel: Recent Labs  Lab 09/26/22 0939 09/29/22 0654  NA 135 134*  K 3.9 3.4*  CL 101 101  CO2 24 24  GLUCOSE 182* 185*  BUN 20 21  CREATININE 0.91 0.90  CALCIUM 8.9 8.5*   GFR: Estimated Creatinine Clearance: 74.4 mL/min (by C-G formula based on SCr of 0.9 mg/dL). Liver Function Tests: Recent Labs  Lab 09/26/22 0939 09/29/22 0654  AST 23 22  ALT 13 19  ALKPHOS  --  59  BILITOT 0.8 0.6  PROT 8.0 7.4  ALBUMIN  --  3.1*   Recent Labs  Lab 09/29/22 0654  INR 1.0  Anemia Panel: No results for input(s): "VITAMINB12", "FOLATE", "FERRITIN", "TIBC", "IRON", "RETICCTPCT" in the last 72 hours. Urine analysis:    Component Value Date/Time   COLORURINE YELLOW 11/24/2021 2024   APPEARANCEUR CLEAR 11/24/2021 2024   LABSPEC 1.032 (H) 11/24/2021 2024   PHURINE 5.0 11/24/2021 2024   GLUCOSEU >=500 (A) 11/24/2021 2024   HGBUR NEGATIVE 11/24/2021 2024   BILIRUBINUR NEGATIVE 11/24/2021 2024   KETONESUR 20 (A) 11/24/2021 2024   PROTEINUR NEGATIVE 11/24/2021 2024   NITRITE NEGATIVE 11/24/2021 2024   LEUKOCYTESUR NEGATIVE 11/24/2021 2024    Radiological Exams on Admission: CT Angio Chest PE W and/or Wo Contrast  Result Date: 09/29/2022 CLINICAL DATA:  Pulmonary embolism (PE) suspected, low to intermediate prob, positive D-dimer EXAM: CT ANGIOGRAPHY CHEST WITH CONTRAST TECHNIQUE: Multidetector CT imaging of the chest was performed using the standard protocol during bolus administration of intravenous contrast. Multiplanar CT image reconstructions and MIPs were obtained to evaluate the vascular anatomy. RADIATION DOSE REDUCTION: This exam was performed according to the departmental dose-optimization program which includes automated exposure control,  adjustment of the mA and/or kV according to patient size and/or use of iterative reconstruction technique. CONTRAST:  75mL OMNIPAQUE IOHEXOL 350 MG/ML SOLN COMPARISON:  11/26/2021 FINDINGS: Cardiovascular: Satisfactory opacification of the pulmonary arteries to the segmental level. No evidence of pulmonary embolism. Thoracic aorta is nonaneurysmal. Atherosclerotic vascular calcifications of the aorta and coronary arteries. Normal heart size. No pericardial effusion. Mediastinum/Nodes: No enlarged mediastinal, hilar, or axillary lymph nodes. Thyroid gland and trachea demonstrate no significant findings. Distal esophagus is mildly thickened. Lungs/Pleura: Passive atelectasis within the dependent lung fields. No focal consolidation. No pleural effusion or pneumothorax. Upper Abdomen: No acute abnormality. Musculoskeletal: No chest wall abnormality. No acute or significant osseous findings. Prior cement augmentation of the T5 vertebral body. Review of the MIP images confirms the above findings. IMPRESSION: 1. No evidence of pulmonary embolism or other acute intrathoracic findings. 2. Mild thickening of the distal esophagus, which can be seen in the setting of esophagitis. 3. Aortic and coronary artery atherosclerosis (ICD10-I70.0). Electronically Signed   By: Duanne Guess D.O.   On: 09/29/2022 09:15   DG Chest Port 1 View  Result Date: 09/29/2022 CLINICAL DATA:  Sepsis. EXAM: PORTABLE CHEST 1 VIEW COMPARISON:  11/24/2021 FINDINGS: The heart size and mediastinal contours are within normal limits. Both lungs are clear. Previous midthoracic spine vertebroplasty noted. IMPRESSION: No active disease. Electronically Signed   By: Danae Orleans M.D.   On: 09/29/2022 08:03    Lanae Boast MD [facility]  If 7PM-7AM, please contact night-coverage [email or web address]  09/29/2022, 11:07 AM

## 2022-09-29 NOTE — H&P (View-Only) (Signed)
 Cardiology Consultation   Patient ID: William Hunter MRN: [id number]; DOB: 06/28/1945  Admit date: 09/29/2022 Date of Consult: 09/29/2022  PCP:  Collins, Dana, DO   [facility] Providers Cardiologist:  Mark Skains, MD        Patient Profile:   William Hunter is a 77 y.o. male with a hx of MI in 1981 with no CAD at cath >> vasospasm, recurrent DVT/PE, DM, HTN, HLD intol statins, aortic atherosclerosis, who is being seen 09/29/2022 for the evaluation of chest pain and elevated troponin at the request of Dr. Gray.  History of Present Illness:   [name] was last seen by cardiology last July.  At that time, he was staying in the SNF because of weakness as well as LLE DVT/PE.  He was having no anginal symptoms and generally doing well from a cardiac standpoint.  [name] gets chest pain occasionally.  The last episode was at least a week ago.  He was awakened from sleep early this a.m. by an episode of 5-7/10 chest pain, his usual angina, associated with shaking.  The shaking is a new symptom.  He has not had any acute illness no change in his general medical condition.  He was a little short of breath, mild nausea, no vomiting or diaphoresis.  He did not take any medications for the pain.  The shaking lasted for an hour, and resolved.  The chest pain lasted for about 2 hours.  He came to the ER where he was given aspirin 325 mg, fentanyl 50 mcg.  The chest pain has resolved.  His troponin came back elevated so he was started on heparin.  He was prescribed Eliquis for the DVT/PE, but ran out 2 weeks ago and quit taking it.  Of note, he has been falling at times recently, the last 1 was a couple weeks ago.  He has multiple superficial soft tissue injuries, healing without infection.    Past Medical History:  Diagnosis Date   Anginal pain    Arthritis    "fingers" (03/09/2012)   Cold feet    "from my diabetes; they stay cold" (03/09/2012)   Coronary artery disease     Coronary artery spasm    since age 33 Dr Tysinger   COVID 07/06/2019   hospitalizied   Dyslipidemia    Femoral DVT (deep venous thrombosis)    left   Headache(784.0)    Hx of gallstones    Dr Ingram lap cholecystectomy   Hx of plastic surgery    plastic surgery following a fall off a loading dock ,lost  all his upper teeth    Hypertension    MI, old    at age 34   Migraines    Pneumonia ~ 1962; 1970's   "double"; "regular" (03/09/2012)   Rheumatoid arthritis    Dr Beekman    Slipped cervical disc 06/10/2006   Dr Tannenbaum in the past/ Dr Speller , chiropractor in 2008   Thyroid disease    Dr kumar   Type II diabetes mellitus     Past Surgical History:  Procedure Laterality Date   CARDIAC CATHETERIZATION     x 3 with NO stents    CHOLECYSTECTOMY  03/11/2012   Procedure: LAPAROSCOPIC CHOLECYSTECTOMY WITH INTRAOPERATIVE CHOLANGIOGRAM;  Surgeon: Haywood M Ingram, MD;  Location: [facility] OR;  Service: General;  Laterality: N/A;   KYPHOPLASTY N/A 05/30/2021   Procedure: THORACIC FIVE KYPHOPLASTY;  Surgeon: Cabbell, Kyle, MD;  Location: [facility] OR;    Service: Neurosurgery;  Laterality: N/A;  THORACIC FIVE KYPHOPLASTY   PENILE PROSTHESIS IMPLANT  1983     Home Medications:  Prior to Admission medications   Medication Sig Start Date End Date Taking? Authorizing Provider  apixaban (ELIQUIS) 5 MG TABS tablet Take 10 mg p.o. twice daily, on  Wed 12/05/21 at 1000-switch to 5 mg p.o. twice daily. 11/29/21   Ghimire, Shanker M, MD  BD PEN NEEDLE MICRO U/F 32G X 6 MM MISC Inject into the skin 3 (three) times daily. 09/16/22   [provider]  BD PEN NEEDLE NANO 2ND GEN 32G X 4 MM MISC  06/07/22   [provider]  carbidopa-levodopa (SINEMET IR) 25-100 MG tablet Take 1 tablet by mouth 3 (three) times daily. 04/10/22   Yan, Yijun, MD  Continuous Glucose Sensor (FREESTYLE LIBRE 2 SENSOR) MISC Place onto the skin. 09/18/22   [provider]  diltiazem (TIAZAC) 300 MG 24 hr capsule  Take 1 capsule (300 mg total) by mouth daily. Patient not taking: Reported on 09/26/2022 12/12/21   Skains, Mark C, MD  folic acid (FOLVITE) 1 MG tablet Take 1 tablet (1 mg total) by mouth daily. 09/26/22   Rice, Christopher W, MD  GVOKE HYPOPEN 2-PACK 1 MG/0.2ML SOAJ Inject into the skin. 04/11/22   [provider]  HUMIRA, 2 PEN, 40 MG/0.4ML PNKT Inject 0.4 mLs into the muscle every 14 (fourteen) days. 09/26/22   Rice, Christopher W, MD  hydroxychloroquine (PLAQUENIL) 200 MG tablet Take 1 tablet (200 mg total) by mouth daily. 09/26/22   Rice, Christopher W, MD  ibuprofen (ADVIL) 200 MG tablet Take 200 mg by mouth daily.    [provider]  insulin aspart (NOVOLOG) 100 UNIT/ML injection 0-5 Units, Subcutaneous, Daily at bedtime: HS scale CBG < 70: implement hypoglycemia measures CBG 70 - 120: 0 units CBG 121 - 150: 0 units CBG 151 - 200: 0 units CBG 201 - 250: 2 units CBG 251 - 300: 3 units CBG 301 - 350: 4 units CBG 351 - 400: 5 units CBG > 400: call MD and obtain STAT lab verification 11/29/21   Ghimire, Shanker M, MD  insulin aspart (NOVOLOG) 100 UNIT/ML injection 0-15 Units, Subcutaneous, 3 times daily with meals CBG < 70: Implement Hypoglycemia Standing Orders and refer to Hypoglycemia Standing Orders sidebar report CBG 70 - 120: 0 units CBG 121 - 150: 2 units CBG 151 - 200: 3 units CBG 201 - 250: 5 units CBG 251 - 300: 8 units CBG 301 - 350: 11 units CBG 351 - 400: 15 units CBG > 400: call MD and obtain STAT lab verification 11/29/21   Ghimire, Shanker M, MD  meclizine (ANTIVERT) 25 MG tablet Take 25 mg by mouth 3 (three) times daily as needed for dizziness.    [provider]  methotrexate (RHEUMATREX) 2.5 MG tablet Take 6 tablets (15 mg total) by mouth once a week. Caution:Chemotherapy. Protect from light. 09/26/22   Rice, Christopher W, MD  MYRBETRIQ 25 MG TB24 tablet Take 25 mg by mouth daily. 09/12/22   [provider]  Study - ORION 4 - inclisiran 300 mg/1.5mL or  placebo SQ injection (PI-Stuckey) Inject 1.5 mLs (300 mg total) into the skin every 6 (six) months. Patient not taking: Reported on 09/26/2022 09/06/21   Stuckey, Thomas D, MD    Inpatient Medications: Scheduled Meds:  aspirin  325 mg Oral Daily   ORION 4 inclisiran or placebo  300 mg Subcutaneous Q6 months     Continuous Infusions:  heparin 950 Units/hr (09/29/22 1003)   PRN Meds:   Allergies:    Allergies  Allergen Reactions   Codeine Swelling    Other reaction(s): rash   Penicillins Hives and Swelling    Has patient had a PCN reaction causing immediate rash, facial/tongue/throat swelling, SOB or lightheadedness with hypotension: Yes Has patient had a PCN reaction causing severe rash involving mucus membranes or skin necrosis: No Has patient had a PCN reaction that required hospitalization No Has patient had a PCN reaction occurring within the last 10 years: No If all of the above answers are "NO", then may proceed with Cephalosporin use.    Prednisone Hives    Other reaction(s): hives   Wool Alcohol [Lanolin] Hives   Empagliflozin Nausea And Vomiting    Other reaction(s): nausea and vomiting   Penicillin G     Other reaction(s): rash   Statins     Other reaction(s): myopathy    Social History:   Social History   Socioeconomic History   Marital status: Married    Spouse name: Shirley   Number of children: 0   Years of education: 14   Highest education level: Not on file  Occupational History    Comment: [employer]  Tobacco Use   Smoking status: Never    Passive exposure: Never   Smokeless tobacco: Never  Vaping Use   Vaping Use: Never used  Substance and Sexual Activity   Alcohol use: No   Drug use: No   Sexual activity: Yes  Other Topics Concern   Not on file  Social History Narrative   Lives with wife   Caffeine- Diet Coke 5 daily   Social Determinants of Health   Financial Resource Strain: Not on file  Food Insecurity: Not on file   Transportation Needs: Not on file  Physical Activity: Not on file  Stress: Not on file  Social Connections: Not on file  Intimate Partner Violence: Not on file    Family History:  Family History  Problem Relation Age of Onset   Heart failure Mother    Cancer - Other Mother        breast   Heart disease Father    Cancer - Other Sister        tongue cancer   Heart attack Sister      ROS:  Please see the history of present illness.  All other ROS reviewed and negative.     Physical Exam/Data:   Vitals:   09/29/22 0830 09/29/22 0845 09/29/22 0915 09/29/22 0930  BP: (!) 164/88     Pulse: (!) 119 (!) 119  (!) 117  Resp: (!) 22 (!) 24  (!) 23  Temp:      TempSrc:      SpO2: 99% 98% 98% 98%  Weight:      Height:       No intake or output data in the 24 hours ending 09/29/22 1032    09/29/2022    6:42 AM 09/26/2022    8:08 AM 04/10/2022   11:27 AM  Last 3 Weights  Weight (lbs) 173 lb 173 lb 181 lb  Weight (kg) 78.472 kg 78.472 kg 82.101 kg     Body mass index is 24.82 kg/m.  General: Frail, chronically ill-appearing male, in no acute distress HEENT: normal Neck: no JVD Vascular: No carotid bruits; Distal pulses 2+ bilaterally Cardiac:  normal S1, S2; RRR; no murmur  Lungs:  clear to auscultation bilaterally, no   wheezing, rhonchi or rales  Abd: soft, nontender, no hepatomegaly  Ext: no edema Musculoskeletal:  No deformities, BUE and BLE strength normal and equal Skin: warm and dry, multiple healing lesions on extremities Neuro:  CNs 2-12 intact, no focal abnormalities noted Psych:  Normal affect   EKG:  The EKG was personally reviewed and demonstrates:  EMS ECG: Sinus tach, heart rate 106, no acute ischemic changes (during pain) ER ECG: Sinus tach, heart rate 107, no acute ischemic changes  Telemetry:  Telemetry was personally reviewed and demonstrates: Sinus tach/sinus rhythm  Relevant CV Studies:  DVT STUDY: 11/27/2021 Summary:  RIGHT:  - There is no  evidence of deep vein thrombosis in the lower extremity.    - No cystic structure found in the popliteal fossa.    LEFT:  - Findings consistent with acute deep vein thrombosis involving the left  common femoral vein, SF junction, left femoral vein, and left peroneal  veins.    - Findings consistent with age indeterminate deep vein thrombosis  involving the left popliteal vein.    - No cystic structure found in the popliteal fossa.    - The common femoral vein obstruction does not appear to extend above  inguinal ligament.   ECHO: 11/27/2021 Sonographer Comments: No subcostal window, Technically challenging study  due to limited acoustic windows, Technically difficult study due to poor  echo windows, suboptimal parasternal window and suboptimal apical window.  IMPRESSIONS   1. Left ventricular ejection fraction, by estimation, is 60 to 65%. The left ventricle has normal function. The left ventricle has no regional wall motion abnormalities. Left ventricular diastolic parameters are  consistent with Grade I diastolic  dysfunction (impaired relaxation).   2. Right ventricular systolic function is normal. The right ventricular size is normal. Tricuspid regurgitation signal is inadequate for assessing  PA pressure.   3. The mitral valve was not well visualized. No evidence of mitral valve regurgitation.   4. The aortic valve was not well visualized. Aortic valve regurgitation is not visualized. No aortic stenosis is present.   5. Aortic dilatation noted. There is mild dilatation of the ascending aorta, measuring  40 mm.   6. Technically difficult study   Comparison(s): No significant change from prior study.   Laboratory Data:  High Sensitivity Troponin:   Recent Labs  Lab 09/29/22 0654 09/29/22 0914  TROPONINIHS 534* 357*     Chemistry Recent Labs  Lab 09/26/22 0939 09/29/22 0654  NA 135 134*  K 3.9 3.4*  CL 101 101  CO2 24 24  GLUCOSE 182* 185*  BUN 20 21   CREATININE 0.91 0.90  CALCIUM 8.9 8.5*  GFRNONAA  --  >60  ANIONGAP  --  9    Recent Labs  Lab 09/26/22 0939 09/29/22 0654  PROT 8.0 7.4  ALBUMIN  --  3.1*  AST 23 22  ALT 13 19  ALKPHOS  --  59  BILITOT 0.8 0.6   Lipids No results for input(s): "CHOL", "TRIG", "HDL", "LABVLDL", "LDLCALC", "CHOLHDL" in the last 168 hours.  Hematology Recent Labs  Lab 09/26/22 0939 09/29/22 0654  WBC 20.9* 9.5  RBC 5.05 4.70  HGB 15.9 14.8  HCT 46.3 42.5  MCV 91.7 90.4  MCH 31.5 31.5  MCHC 34.3 34.8  RDW 12.8 13.1  PLT 301 277   Thyroid  Lab Results  Component Value Date   TSH 0.007 (L) 01/31/2022    DDimer  Recent Labs  Lab 09/29/22 0654  DDIMER 0.97*     Lab Results  Component Value Date   HGBA1C 9.7 (H) 11/25/2021    Radiology/Studies:  CT Angio Chest PE W and/or Wo Contrast  Result Date: 09/29/2022 CLINICAL DATA:  Pulmonary embolism (PE) suspected, low to intermediate prob, positive D-dimer EXAM: CT ANGIOGRAPHY CHEST WITH CONTRAST TECHNIQUE: Multidetector CT imaging of the chest was performed using the standard protocol during bolus administration of intravenous contrast. Multiplanar CT image reconstructions and MIPs were obtained to evaluate the vascular anatomy. RADIATION DOSE REDUCTION: This exam was performed according to the departmental dose-optimization program which includes automated exposure control, adjustment of the mA and/or kV according to patient size and/or use of iterative reconstruction technique. CONTRAST:  75mL OMNIPAQUE IOHEXOL 350 MG/ML SOLN COMPARISON:  11/26/2021 FINDINGS: Cardiovascular: Satisfactory opacification of the pulmonary arteries to the segmental level. No evidence of pulmonary embolism. Thoracic aorta is nonaneurysmal. Atherosclerotic vascular calcifications of the aorta and coronary arteries. Normal heart size. No pericardial effusion. Mediastinum/Nodes: No enlarged mediastinal, hilar, or axillary lymph nodes. Thyroid gland and trachea  demonstrate no significant findings. Distal esophagus is mildly thickened. Lungs/Pleura: Passive atelectasis within the dependent lung fields. No focal consolidation. No pleural effusion or pneumothorax. Upper Abdomen: No acute abnormality. Musculoskeletal: No chest wall abnormality. No acute or significant osseous findings. Prior cement augmentation of the T5 vertebral body. Review of the MIP images confirms the above findings. IMPRESSION: 1. No evidence of pulmonary embolism or other acute intrathoracic findings. 2. Mild thickening of the distal esophagus, which can be seen in the setting of esophagitis. 3. Aortic and coronary artery atherosclerosis (ICD10-I70.0). Electronically Signed   By: Nicholas  Plundo D.O.   On: 09/29/2022 09:15   DG Chest Port 1 View  Result Date: 09/29/2022 CLINICAL DATA:  Sepsis. EXAM: PORTABLE CHEST 1 VIEW COMPARISON:  11/24/2021 FINDINGS: The heart size and mediastinal contours are within normal limits. Both lungs are clear. Previous midthoracic spine vertebroplasty noted. IMPRESSION: No active disease. Electronically Signed   By: John A Stahl M.D.   On: 09/29/2022 08:03     Assessment and Plan:   Chest pain: -Troponin is elevated, ECG nonacute.   - Symptoms have resolved. -Repeat troponin is in process -Follow-up on second troponin, check echo -Coronary atherosclerosis seen on CT, no ischemic eval in more than 10 years -Discuss with MD cardiac CT versus cath  2.  History of DVT and PE in 2021 -See lower extremity Dopplers from 11/2021, DVT was present -Has not had Eliquis in at least 2 weeks (prescription has expired) -No PE on chest CT -Currently on heparin -Management of this and other issues per IM   Risk Assessment/Risk Scores:     TIMI Risk Score for Unstable Angina or Non-ST Elevation MI:   The patient's TIMI risk score is 3, which indicates a 13% risk of all cause mortality, new or recurrent myocardial infarction or need for urgent revascularization  in the next 14 days.   For questions or updates, please contact [facility] Please consult [email or web address] for contact info under    Signed, Rhonda Barrett, PA-C  09/29/2022 10:32 AM  Patient seen and examined with Rhonda Barrett PA-C.  Agree as above, with the following exceptions and changes as noted below.  Patient admitted with elevated troponins and chest pain though troponins are downtrending.  Blood pressure is relatively elevated, this could be contributing.  Nevertheless review of CT chest demonstrates severe coronary artery calcifications including left main, LAD, and RCA. Gen: NAD, CV: RRR, no murmurs, Lungs: clear, Abd: soft, Extrem: Warm, well   perfused, no edema, Neuro/Psych: alert and oriented x 3, normal mood and affect. All available labs, radiology testing, previous records reviewed.  Discussed the merits of noninvasive testing versus invasive coronary angiography.  Given the patient's degree of coronary artery calcifications, intermittent chest pain, elevated troponin, and risk factors of rheumatoid arthritis, hypertension, diabetes, hyperlipidemia, it may be most efficient to obtain coronary angiography.  High probability of obstructive CAD.  Patient has been off of his Eliquis as reported, currently on heparin.  Renal function stable, CBC normal.  Hypokalemic, needs repletion.  CRP elevated, was seen by his rheumatologist on Thursday.  INFORMED CONSENT: I have reviewed the risks, indications, and alternatives to cardiac catheterization, possible angioplasty, and stenting with the patient. Risks include but are not limited to bleeding, infection, vascular injury, stroke, myocardial infarction, arrhythmia, kidney injury, radiation-related injury in the case of prolonged fluoroscopy use, emergency cardiac surgery, and death. The patient understands the risks of serious complication is 1-2 in 1000 with diagnostic cardiac cath and 1-2% or less with angioplasty/stenting.     Tremayne Sheldon A Kirin Pastorino, MD 09/29/22 2:18 PM   

## 2022-09-29 NOTE — Consult Note (Addendum)
Cardiology Consultation   Patient ID: [name] MRN: [id number]; DOB: 1945/10/28  Admit date: 09/29/2022 Date of Consult: 09/29/2022  PCP:  Irena Reichmann, DO   [facility] Providers Cardiologist:  Donato Schultz, MD        Patient Profile:   William Hunter is a 77 y.o. male with a hx of MI in 1981 with no CAD at cath >> vasospasm, recurrent DVT/PE, DM, HTN, HLD intol statins, aortic atherosclerosis, who is being seen 09/29/2022 for the evaluation of chest pain and elevated troponin at the request of Dr. Wallace Cullens.  History of Present Illness:   William Hunter was last seen by cardiology last July.  At that time, he was staying in the SNF because of weakness as well as LLE DVT/PE.  He was having no anginal symptoms and generally doing well from a cardiac standpoint.  William Hunter gets chest pain occasionally.  The last episode was at least a week ago.  He was awakened from sleep early this a.m. by an episode of 5-7/10 chest pain, his usual angina, associated with shaking.  The shaking is a new symptom.  He has not had any acute illness no change in his general medical condition.  He was a little short of breath, mild nausea, no vomiting or diaphoresis.  He did not take any medications for the pain.  The shaking lasted for an hour, and resolved.  The chest pain lasted for about 2 hours.  He came to the ER where he was given aspirin 325 mg, fentanyl 50 mcg.  The chest pain has resolved.  His troponin came back elevated so he was started on heparin.  He was prescribed Eliquis for the DVT/PE, but ran out 2 weeks ago and quit taking it.  Of note, he has been falling at times recently, the last 1 was a couple weeks ago.  He has multiple superficial soft tissue injuries, healing without infection.    Past Medical History:  Diagnosis Date   Anginal pain    Arthritis    "fingers" (03/09/2012)   Cold feet    "from my diabetes; they stay cold" (03/09/2012)   Coronary artery disease     Coronary artery spasm    since age 66 Dr Aleen Campi   COVID 07/06/2019   hospitalizied   Dyslipidemia    Femoral DVT (deep venous thrombosis)    left   Headache(784.0)    Hx of gallstones    Dr Derrell Lolling lap cholecystectomy   Hx of plastic surgery    plastic surgery following a fall off a loading dock ,lost  all his upper teeth    Hypertension    MI, old    at age 57   Migraines    Pneumonia ~ 1962; 1970's   "double"; "regular" (03/09/2012)   Rheumatoid arthritis    Dr Dierdre Forth    Slipped cervical disc 06/10/2006   Dr Patsi Sears in the past/ Dr Larita Fife , chiropractor in 2008   Thyroid disease    Dr Lucianne Muss   Type II diabetes mellitus     Past Surgical History:  Procedure Laterality Date   CARDIAC CATHETERIZATION     x 3 with NO stents    CHOLECYSTECTOMY  03/11/2012   Procedure: LAPAROSCOPIC CHOLECYSTECTOMY WITH INTRAOPERATIVE CHOLANGIOGRAM;  Surgeon: Ernestene Mention, MD;  Location: [facility] OR;  Service: General;  Laterality: N/A;   KYPHOPLASTY N/A 05/30/2021   Procedure: THORACIC FIVE KYPHOPLASTY;  Surgeon: Coletta Memos, MD;  Location: [facility] OR;  Service: Neurosurgery;  Laterality: N/A;  THORACIC FIVE KYPHOPLASTY   PENILE PROSTHESIS IMPLANT  1983     Home Medications:  Prior to Admission medications   Medication Sig Start Date End Date Taking? Authorizing Provider  apixaban (ELIQUIS) 5 MG TABS tablet Take 10 mg p.o. twice daily, on  Wed 12/05/21 at 1000-switch to 5 mg p.o. twice daily. 11/29/21   Ghimire, Werner Lean, MD  BD PEN NEEDLE MICRO U/F 32G X 6 MM MISC Inject into the skin 3 (three) times daily. 09/16/22   [provider]  BD PEN NEEDLE NANO 2ND GEN 32G X 4 MM MISC  06/07/22   [provider]  carbidopa-levodopa (SINEMET IR) 25-100 MG tablet Take 1 tablet by mouth 3 (three) times daily. 04/10/22   Levert Feinstein, MD  Continuous Glucose Sensor (FREESTYLE LIBRE 2 SENSOR) MISC Place onto the skin. 09/18/22   [provider]  diltiazem (TIAZAC) 300 MG 24 hr capsule  Take 1 capsule (300 mg total) by mouth daily. Patient not taking: Reported on 09/26/2022 12/12/21   Jake Bathe, MD  folic acid (FOLVITE) 1 MG tablet Take 1 tablet (1 mg total) by mouth daily. 09/26/22   Rice, Jamesetta Orleans, MD  GVOKE HYPOPEN 2-PACK 1 MG/0.2ML SOAJ Inject into the skin. 04/11/22   [provider]  HUMIRA, 2 PEN, 40 MG/0.4ML PNKT Inject 0.4 mLs into the muscle every 14 (fourteen) days. 09/26/22   Rice, Jamesetta Orleans, MD  hydroxychloroquine (PLAQUENIL) 200 MG tablet Take 1 tablet (200 mg total) by mouth daily. 09/26/22   Fuller Plan, MD  ibuprofen (ADVIL) 200 MG tablet Take 200 mg by mouth daily.    [provider]  insulin aspart (NOVOLOG) 100 UNIT/ML injection 0-5 Units, Subcutaneous, Daily at bedtime: HS scale CBG < 70: implement hypoglycemia measures CBG 70 - 120: 0 units CBG 121 - 150: 0 units CBG 151 - 200: 0 units CBG 201 - 250: 2 units CBG 251 - 300: 3 units CBG 301 - 350: 4 units CBG 351 - 400: 5 units CBG > 400: call MD and obtain STAT lab verification 11/29/21   Maretta Bees, MD  insulin aspart (NOVOLOG) 100 UNIT/ML injection 0-15 Units, Subcutaneous, 3 times daily with meals CBG < 70: Implement Hypoglycemia Standing Orders and refer to Hypoglycemia Standing Orders sidebar report CBG 70 - 120: 0 units CBG 121 - 150: 2 units CBG 151 - 200: 3 units CBG 201 - 250: 5 units CBG 251 - 300: 8 units CBG 301 - 350: 11 units CBG 351 - 400: 15 units CBG > 400: call MD and obtain STAT lab verification 11/29/21   Maretta Bees, MD  meclizine (ANTIVERT) 25 MG tablet Take 25 mg by mouth 3 (three) times daily as needed for dizziness.    [provider]  methotrexate (RHEUMATREX) 2.5 MG tablet Take 6 tablets (15 mg total) by mouth once a week. Caution:Chemotherapy. Protect from light. 09/26/22   Fuller Plan, MD  MYRBETRIQ 25 MG TB24 tablet Take 25 mg by mouth daily. 09/12/22   [provider]  Study - ORION 4 - inclisiran 300 mg/1.33mL or  placebo SQ injection (PI-Stuckey) Inject 1.5 mLs (300 mg total) into the skin every 6 (six) months. Patient not taking: Reported on 09/26/2022 09/06/21   Herby Abraham, MD    Inpatient Medications: Scheduled Meds:  aspirin  325 mg Oral Daily   ORION 4 inclisiran or placebo  300 mg Subcutaneous Q6 months  Continuous Infusions:  heparin 950 Units/hr (09/29/22 1003)   PRN Meds:   Allergies:    Allergies  Allergen Reactions   Codeine Swelling    Other reaction(s): rash   Penicillins Hives and Swelling    Has patient had a PCN reaction causing immediate rash, facial/tongue/throat swelling, SOB or lightheadedness with hypotension: Yes Has patient had a PCN reaction causing severe rash involving mucus membranes or skin necrosis: No Has patient had a PCN reaction that required hospitalization No Has patient had a PCN reaction occurring within the last 10 years: No If all of the above answers are "NO", then may proceed with Cephalosporin use.    Prednisone Hives    Other reaction(s): hives   Wool Alcohol [Lanolin] Hives   Empagliflozin Nausea And Vomiting    Other reaction(s): nausea and vomiting   Penicillin G     Other reaction(s): rash   Statins     Other reaction(s): myopathy    Social History:   Social History   Socioeconomic History   Marital status: Married    Spouse name: William Hunter   Number of children: 0   Years of education: 14   Highest education level: Not on file  Occupational History    Comment: [employer]  Tobacco Use   Smoking status: Never    Passive exposure: Never   Smokeless tobacco: Never  Vaping Use   Vaping Use: Never used  Substance and Sexual Activity   Alcohol use: No   Drug use: No   Sexual activity: Yes  Other Topics Concern   Not on file  Social History Narrative   Lives with wife   Caffeine- Diet Coke 5 daily   Social Determinants of Health   Financial Resource Strain: Not on file  Food Insecurity: Not on file   Transportation Needs: Not on file  Physical Activity: Not on file  Stress: Not on file  Social Connections: Not on file  Intimate Partner Violence: Not on file    Family History:  Family History  Problem Relation Age of Onset   Heart failure Mother    Cancer - Other Mother        breast   Heart disease Father    Cancer - Other Sister        tongue cancer   Heart attack Sister      ROS:  Please see the history of present illness.  All other ROS reviewed and negative.     Physical Exam/Data:   Vitals:   09/29/22 0830 09/29/22 0845 09/29/22 0915 09/29/22 0930  BP: (!) 164/88     Pulse: (!) 119 (!) 119  (!) 117  Resp: (!) 22 (!) 24  (!) 23  Temp:      TempSrc:      SpO2: 99% 98% 98% 98%  Weight:      Height:       No intake or output data in the 24 hours ending 09/29/22 1032    09/29/2022    6:42 AM 09/26/2022    8:08 AM 04/10/2022   11:27 AM  Last 3 Weights  Weight (lbs) 173 lb 173 lb 181 lb  Weight (kg) 78.472 kg 78.472 kg 82.101 kg     Body mass index is 24.82 kg/m.  General: Frail, chronically ill-appearing male, in no acute distress HEENT: normal Neck: no JVD Vascular: No carotid bruits; Distal pulses 2+ bilaterally Cardiac:  normal S1, S2; RRR; no murmur  Lungs:  clear to auscultation bilaterally, no  wheezing, rhonchi or rales  Abd: soft, nontender, no hepatomegaly  Ext: no edema Musculoskeletal:  No deformities, BUE and BLE strength normal and equal Skin: warm and dry, multiple healing lesions on extremities Neuro:  CNs 2-12 intact, no focal abnormalities noted Psych:  Normal affect   EKG:  The EKG was personally reviewed and demonstrates:  EMS ECG: Sinus tach, heart rate 106, no acute ischemic changes (during pain) ER ECG: Sinus tach, heart rate 107, no acute ischemic changes  Telemetry:  Telemetry was personally reviewed and demonstrates: Sinus tach/sinus rhythm  Relevant CV Studies:  DVT STUDY: 11/27/2021 Summary:  RIGHT:  - There is no  evidence of deep vein thrombosis in the lower extremity.    - No cystic structure found in the popliteal fossa.    LEFT:  - Findings consistent with acute deep vein thrombosis involving the left  common femoral vein, SF junction, left femoral vein, and left peroneal  veins.    - Findings consistent with age indeterminate deep vein thrombosis  involving the left popliteal vein.    - No cystic structure found in the popliteal fossa.    - The common femoral vein obstruction does not appear to extend above  inguinal ligament.   ECHO: 11/27/2021 Sonographer Comments: No subcostal window, Technically challenging study  due to limited acoustic windows, Technically difficult study due to poor  echo windows, suboptimal parasternal window and suboptimal apical window.  IMPRESSIONS   1. Left ventricular ejection fraction, by estimation, is 60 to 65%. The left ventricle has normal function. The left ventricle has no regional wall motion abnormalities. Left ventricular diastolic parameters are  consistent with Grade I diastolic  dysfunction (impaired relaxation).   2. Right ventricular systolic function is normal. The right ventricular size is normal. Tricuspid regurgitation signal is inadequate for assessing  PA pressure.   3. The mitral valve was not well visualized. No evidence of mitral valve regurgitation.   4. The aortic valve was not well visualized. Aortic valve regurgitation is not visualized. No aortic stenosis is present.   5. Aortic dilatation noted. There is mild dilatation of the ascending aorta, measuring  40 mm.   6. Technically difficult study   Comparison(s): No significant change from prior study.   Laboratory Data:  High Sensitivity Troponin:   Recent Labs  Lab 09/29/22 0654 09/29/22 0914  TROPONINIHS 534* 357*     Chemistry Recent Labs  Lab 09/26/22 0939 09/29/22 0654  NA 135 134*  K 3.9 3.4*  CL 101 101  CO2 24 24  GLUCOSE 182* 185*  BUN 20 21   CREATININE 0.91 0.90  CALCIUM 8.9 8.5*  GFRNONAA  --  >60  ANIONGAP  --  9    Recent Labs  Lab 09/26/22 0939 09/29/22 0654  PROT 8.0 7.4  ALBUMIN  --  3.1*  AST 23 22  ALT 13 19  ALKPHOS  --  59  BILITOT 0.8 0.6   Lipids No results for input(s): "CHOL", "TRIG", "HDL", "LABVLDL", "LDLCALC", "CHOLHDL" in the last 168 hours.  Hematology Recent Labs  Lab 09/26/22 0939 09/29/22 0654  WBC 20.9* 9.5  RBC 5.05 4.70  HGB 15.9 14.8  HCT 46.3 42.5  MCV 91.7 90.4  MCH 31.5 31.5  MCHC 34.3 34.8  RDW 12.8 13.1  PLT 301 277   Thyroid  Lab Results  Component Value Date   TSH 0.007 (L) 01/31/2022    DDimer  Recent Labs  Lab 09/29/22 0654  DDIMER 0.97*  Lab Results  Component Value Date   HGBA1C 9.7 (H) 11/25/2021    Radiology/Studies:  CT Angio Chest PE W and/or Wo Contrast  Result Date: 09/29/2022 CLINICAL DATA:  Pulmonary embolism (PE) suspected, low to intermediate prob, positive D-dimer EXAM: CT ANGIOGRAPHY CHEST WITH CONTRAST TECHNIQUE: Multidetector CT imaging of the chest was performed using the standard protocol during bolus administration of intravenous contrast. Multiplanar CT image reconstructions and MIPs were obtained to evaluate the vascular anatomy. RADIATION DOSE REDUCTION: This exam was performed according to the departmental dose-optimization program which includes automated exposure control, adjustment of the mA and/or kV according to patient size and/or use of iterative reconstruction technique. CONTRAST:  75mL OMNIPAQUE IOHEXOL 350 MG/ML SOLN COMPARISON:  11/26/2021 FINDINGS: Cardiovascular: Satisfactory opacification of the pulmonary arteries to the segmental level. No evidence of pulmonary embolism. Thoracic aorta is nonaneurysmal. Atherosclerotic vascular calcifications of the aorta and coronary arteries. Normal heart size. No pericardial effusion. Mediastinum/Nodes: No enlarged mediastinal, hilar, or axillary lymph nodes. Thyroid gland and trachea  demonstrate no significant findings. Distal esophagus is mildly thickened. Lungs/Pleura: Passive atelectasis within the dependent lung fields. No focal consolidation. No pleural effusion or pneumothorax. Upper Abdomen: No acute abnormality. Musculoskeletal: No chest wall abnormality. No acute or significant osseous findings. Prior cement augmentation of the T5 vertebral body. Review of the MIP images confirms the above findings. IMPRESSION: 1. No evidence of pulmonary embolism or other acute intrathoracic findings. 2. Mild thickening of the distal esophagus, which can be seen in the setting of esophagitis. 3. Aortic and coronary artery atherosclerosis (ICD10-I70.0). Electronically Signed   By: Duanne Guess D.O.   On: 09/29/2022 09:15   DG Chest Port 1 View  Result Date: 09/29/2022 CLINICAL DATA:  Sepsis. EXAM: PORTABLE CHEST 1 VIEW COMPARISON:  11/24/2021 FINDINGS: The heart size and mediastinal contours are within normal limits. Both lungs are clear. Previous midthoracic spine vertebroplasty noted. IMPRESSION: No active disease. Electronically Signed   By: Danae Orleans M.D.   On: 09/29/2022 08:03     Assessment and Plan:   Chest pain: -Troponin is elevated, ECG nonacute.   - Symptoms have resolved. -Repeat troponin is in process -Follow-up on second troponin, check echo -Coronary atherosclerosis seen on CT, no ischemic eval in more than 10 years -Discuss with MD cardiac CT versus cath  2.  History of DVT and PE in 2021 -See lower extremity Dopplers from 11/2021, DVT was present -Has not had Eliquis in at least 2 weeks (prescription has expired) -No PE on chest CT -Currently on heparin -Management of this and other issues per IM   Risk Assessment/Risk Scores:     TIMI Risk Score for Unstable Angina or Non-ST Elevation MI:   The patient's TIMI risk score is 3, which indicates a 13% risk of all cause mortality, new or recurrent myocardial infarction or need for urgent revascularization  in the next 14 days.   For questions or updates, please contact [facility] Please consult [email or web address] for contact info under    Signed, Theodore Demark, PA-C  09/29/2022 10:32 AM  Patient seen and examined with Theodore Demark PA-C.  Agree as above, with the following exceptions and changes as noted below.  Patient admitted with elevated troponins and chest pain though troponins are downtrending.  Blood pressure is relatively elevated, this could be contributing.  Nevertheless review of CT chest demonstrates severe coronary artery calcifications including left main, LAD, and RCA. Gen: NAD, CV: RRR, no murmurs, Lungs: clear, Abd: soft, Extrem: Warm, well  perfused, no edema, Neuro/Psych: alert and oriented x 3, normal mood and affect. All available labs, radiology testing, previous records reviewed.  Discussed the merits of noninvasive testing versus invasive coronary angiography.  Given the patient's degree of coronary artery calcifications, intermittent chest pain, elevated troponin, and risk factors of rheumatoid arthritis, hypertension, diabetes, hyperlipidemia, it may be most efficient to obtain coronary angiography.  High probability of obstructive CAD.  Patient has been off of his Eliquis as reported, currently on heparin.  Renal function stable, CBC normal.  Hypokalemic, needs repletion.  CRP elevated, was seen by his rheumatologist on Thursday.  INFORMED CONSENT: I have reviewed the risks, indications, and alternatives to cardiac catheterization, possible angioplasty, and stenting with the patient. Risks include but are not limited to bleeding, infection, vascular injury, stroke, myocardial infarction, arrhythmia, kidney injury, radiation-related injury in the case of prolonged fluoroscopy use, emergency cardiac surgery, and death. The patient understands the risks of serious complication is 1-2 in 1000 with diagnostic cardiac cath and 1-2% or less with angioplasty/stenting.     Parke Poisson, MD 09/29/22 2:18 PM

## 2022-09-29 NOTE — ED Notes (Signed)
Pt soiled in urine, linen changed, brief changed. Pt cleaned with no rinse spray, condom cath applied. New gown applied. Pt repositioned in bed. All needs addressed. Pt stable and NADN. Care plan continued.

## 2022-09-29 NOTE — ED Triage Notes (Signed)
Pt arrives EMS with intermittent chest pain over the last few weeks. Pt reports pain got worse today due to feeling shaky. Pt reports increased weakness as well since stopping therapy. Pt reports pain into stomach as well.

## 2022-09-29 NOTE — ED Provider Notes (Signed)
EMERGENCY DEPARTMENT AT [facility] Provider Note   CSN: [id number] Arrival date & time: 09/29/22  1191     History  Chief Complaint  Patient presents with   Chest Pain    William Hunter is a 77 y.o. male.  Patient presents to the emergency department complaint of substernal chest pain which began at 130 this morning.  The pain awakened him from sleep.  He states pain was initially 7 out of 10.  It was described as a pressure, "something sitting on my chest".  He states that his body "hurts all over", coinciding with onset of chest pain, but is unable to pinpoint any specific radiation of pain.  He does endorse mild associated shortness of breath.  He denies feeling sick recently.  He denies cough, fevers noted at home.  He does endorse mild nausea associated with the chest pain.  He denies any aggravating or alleviating factors.  Past medical history significant for rheumatoid arthritis, hypertension, coronary vasospasm, aortic atherosclerosis, myocardial infarction age 51, history of PE, type II DM The patient has a history of DVT and is supposed to be taking Eliquis but stopped 2 weeks ago due to running out.  HPI     Home Medications Prior to Admission medications   Medication Sig Start Date End Date Taking? Authorizing Provider  apixaban (ELIQUIS) 5 MG TABS tablet Take 10 mg p.o. twice daily, on  Wed 12/05/21 at 1000-switch to 5 mg p.o. twice daily. 11/29/21   Ghimire, Werner Lean, MD  BD PEN NEEDLE MICRO U/F 32G X 6 MM MISC Inject into the skin 3 (three) times daily. 09/16/22   [provider]  BD PEN NEEDLE NANO 2ND GEN 32G X 4 MM MISC  06/07/22   [provider]  carbidopa-levodopa (SINEMET IR) 25-100 MG tablet Take 1 tablet by mouth 3 (three) times daily. 04/10/22   Levert Feinstein, MD  Continuous Glucose Sensor (FREESTYLE LIBRE 2 SENSOR) MISC Place onto the skin. 09/18/22   [provider]  diltiazem (TIAZAC) 300 MG 24 hr capsule Take 1  capsule (300 mg total) by mouth daily. Patient not taking: Reported on 09/26/2022 12/12/21   Jake Bathe, MD  folic acid (FOLVITE) 1 MG tablet Take 1 tablet (1 mg total) by mouth daily. 09/26/22   Rice, Jamesetta Orleans, MD  GVOKE HYPOPEN 2-PACK 1 MG/0.2ML SOAJ Inject into the skin. 04/11/22   [provider]  HUMIRA, 2 PEN, 40 MG/0.4ML PNKT Inject 0.4 mLs into the muscle every 14 (fourteen) days. 09/26/22   Rice, Jamesetta Orleans, MD  hydroxychloroquine (PLAQUENIL) 200 MG tablet Take 1 tablet (200 mg total) by mouth daily. 09/26/22   Fuller Plan, MD  ibuprofen (ADVIL) 200 MG tablet Take 200 mg by mouth daily.    [provider]  insulin aspart (NOVOLOG) 100 UNIT/ML injection 0-5 Units, Subcutaneous, Daily at bedtime: HS scale CBG < 70: implement hypoglycemia measures CBG 70 - 120: 0 units CBG 121 - 150: 0 units CBG 151 - 200: 0 units CBG 201 - 250: 2 units CBG 251 - 300: 3 units CBG 301 - 350: 4 units CBG 351 - 400: 5 units CBG > 400: call MD and obtain STAT lab verification 11/29/21   Maretta Bees, MD  insulin aspart (NOVOLOG) 100 UNIT/ML injection 0-15 Units, Subcutaneous, 3 times daily with meals CBG < 70: Implement Hypoglycemia Standing Orders and refer to Hypoglycemia Standing Orders sidebar report CBG 70 - 120: 0 units CBG  121 - 150: 2 units CBG 151 - 200: 3 units CBG 201 - 250: 5 units CBG 251 - 300: 8 units CBG 301 - 350: 11 units CBG 351 - 400: 15 units CBG > 400: call MD and obtain STAT lab verification 11/29/21   Maretta Bees, MD  meclizine (ANTIVERT) 25 MG tablet Take 25 mg by mouth 3 (three) times daily as needed for dizziness.    [provider]  methotrexate (RHEUMATREX) 2.5 MG tablet Take 6 tablets (15 mg total) by mouth once a week. Caution:Chemotherapy. Protect from light. 09/26/22   Fuller Plan, MD  MYRBETRIQ 25 MG TB24 tablet Take 25 mg by mouth daily. 09/12/22   [provider]  Study - ORION 4 - inclisiran 300 mg/1.18mL or placebo SQ  injection (PI-Stuckey) Inject 1.5 mLs (300 mg total) into the skin every 6 (six) months. Patient not taking: Reported on 09/26/2022 09/06/21   Herby Abraham, MD      Allergies    Codeine, Penicillins, Prednisone, Wool alcohol [lanolin], Empagliflozin, Penicillin g, and Statins    Review of Systems   Review of Systems  Physical Exam Updated Vital Signs BP 135/69   Pulse (!) 114   Temp (!) 100.9 F (38.3 C) (Oral)   Resp (!) 23   Ht  (1.778 m)   Wt 78.5 kg   SpO2 96%   BMI 24.82 kg/m  Physical Exam Vitals and nursing note reviewed.  Constitutional:      General: He is not in acute distress.    Appearance: He is well-developed.  HENT:     Head: Normocephalic and atraumatic.  Eyes:     Conjunctiva/sclera: Conjunctivae normal.  Cardiovascular:     Rate and Rhythm: Regular rhythm. Tachycardia present.  Pulmonary:     Effort: Pulmonary effort is normal. No respiratory distress.     Breath sounds: Normal breath sounds.  Chest:     Chest wall: No mass, deformity, tenderness or crepitus.  Abdominal:     Palpations: Abdomen is soft.     Tenderness: There is no abdominal tenderness.  Musculoskeletal:        General: No swelling.     Cervical back: Neck supple.     Right lower leg: No edema.     Left lower leg: No edema.  Skin:    General: Skin is warm and dry.     Capillary Refill: Capillary refill takes less than 2 seconds.  Neurological:     Mental Status: He is alert.  Psychiatric:        Mood and Affect: Mood normal.     ED Results / Procedures / Treatments   Labs (all labs ordered are listed, but only abnormal results are displayed) Labs Reviewed  COMPREHENSIVE METABOLIC PANEL - Abnormal; Notable for the following components:      Result Value   Sodium 134 (*)    Potassium 3.4 (*)    Glucose, Bld 185 (*)    Calcium 8.5 (*)    Albumin 3.1 (*)    All other components within normal limits  CBC WITH DIFFERENTIAL/PLATELET - Abnormal; Notable for the  following components:   Neutro Abs 8.1 (*)    All other components within normal limits  D-DIMER, QUANTITATIVE - Abnormal; Notable for the following components:   D-Dimer, Quant 0.97 (*)    All other components within normal limits  TROPONIN I (HIGH SENSITIVITY) - Abnormal; Notable for the following components:   Troponin I (High  Sensitivity) 534 (*)    All other components within normal limits  TROPONIN I (HIGH SENSITIVITY) - Abnormal; Notable for the following components:   Troponin I (High Sensitivity) 357 (*)    All other components within normal limits  CULTURE, BLOOD (ROUTINE X 2)  CULTURE, BLOOD (ROUTINE X 2)  LACTIC ACID, PLASMA  PROTIME-INR  APTT  LACTIC ACID, PLASMA  URINALYSIS, ROUTINE W REFLEX MICROSCOPIC  HEPARIN LEVEL (UNFRACTIONATED)  APTT    EKG EKG Interpretation  Date/Time:  Sunday September 29 2022 06:45:17 EDT Ventricular Rate:  107 PR Interval:  188 QRS Duration: 79 QT Interval:  306 QTC Calculation: 409 R Axis:   60 Text Interpretation: Sinus tachycardia with irregular rate Confirmed by Tanda Rockers ([id number]) on 09/29/2022 8:25:54 AM  Radiology CT Angio Chest PE W and/or Wo Contrast  Result Date: 09/29/2022 CLINICAL DATA:  Pulmonary embolism (PE) suspected, low to intermediate prob, positive D-dimer EXAM: CT ANGIOGRAPHY CHEST WITH CONTRAST TECHNIQUE: Multidetector CT imaging of the chest was performed using the standard protocol during bolus administration of intravenous contrast. Multiplanar CT image reconstructions and MIPs were obtained to evaluate the vascular anatomy. RADIATION DOSE REDUCTION: This exam was performed according to the departmental dose-optimization program which includes automated exposure control, adjustment of the mA and/or kV according to patient size and/or use of iterative reconstruction technique. CONTRAST:  75mL OMNIPAQUE IOHEXOL 350 MG/ML SOLN COMPARISON:  11/26/2021 FINDINGS: Cardiovascular: Satisfactory opacification of the pulmonary  arteries to the segmental level. No evidence of pulmonary embolism. Thoracic aorta is nonaneurysmal. Atherosclerotic vascular calcifications of the aorta and coronary arteries. Normal heart size. No pericardial effusion. Mediastinum/Nodes: No enlarged mediastinal, hilar, or axillary lymph nodes. Thyroid gland and trachea demonstrate no significant findings. Distal esophagus is mildly thickened. Lungs/Pleura: Passive atelectasis within the dependent lung fields. No focal consolidation. No pleural effusion or pneumothorax. Upper Abdomen: No acute abnormality. Musculoskeletal: No chest wall abnormality. No acute or significant osseous findings. Prior cement augmentation of the T5 vertebral body. Review of the MIP images confirms the above findings. IMPRESSION: 1. No evidence of pulmonary embolism or other acute intrathoracic findings. 2. Mild thickening of the distal esophagus, which can be seen in the setting of esophagitis. 3. Aortic and coronary artery atherosclerosis (ICD10-I70.0). Electronically Signed   By: Duanne Guess D.O.   On: 09/29/2022 09:15   DG Chest Port 1 View  Result Date: 09/29/2022 CLINICAL DATA:  Sepsis. EXAM: PORTABLE CHEST 1 VIEW COMPARISON:  11/24/2021 FINDINGS: The heart size and mediastinal contours are within normal limits. Both lungs are clear. Previous midthoracic spine vertebroplasty noted. IMPRESSION: No active disease. Electronically Signed   By: Danae Orleans M.D.   On: 09/29/2022 08:03    Procedures .Critical Care  Performed by: Darrick Grinder, PA-C Authorized by: Darrick Grinder, PA-C   Critical care provider statement:    Critical care time (minutes):  30   Critical care was necessary to treat or prevent imminent or life-threatening deterioration of the following conditions:  Cardiac failure   Critical care was time spent personally by me on the following activities:  Development of treatment plan with patient or surrogate, discussions with consultants,  evaluation of patient's response to treatment, examination of patient, ordering and review of laboratory studies, ordering and review of radiographic studies, ordering and performing treatments and interventions, pulse oximetry, re-evaluation of patient's condition and review of old charts   Care discussed with: admitting provider       Medications Ordered in ED Medications  aspirin tablet  325 mg (325 mg Oral Given 09/29/22 0836)  heparin ADULT infusion 100 units/mL (25000 units/276mL) (950 Units/hr Intravenous New Bag/Given 09/29/22 1003)  lactated ringers bolus 1,000 mL (1,000 mLs Intravenous New Bag/Given 09/29/22 0728)  fentaNYL (SUBLIMAZE) injection 50 mcg (50 mcg Intravenous Given 09/29/22 0837)  iohexol (OMNIPAQUE) 350 MG/ML injection 75 mL (75 mLs Intravenous Contrast Given 09/29/22 0907)  heparin bolus via infusion 4,000 Units (4,000 Units Intravenous Bolus from Bag 09/29/22 1003)  ondansetron (ZOFRAN) injection 4 mg (4 mg Intravenous Given 09/29/22 1027)    ED Course/ Medical Decision Making/ A&P                             Medical Decision Making Amount and/or Complexity of Data Reviewed Labs: ordered. Radiology: ordered. ECG/medicine tests: ordered.  Risk OTC drugs. Prescription drug management.   This patient presents to the ED for concern of chest pain with shortness of breath, this involves an extensive number of treatment options, and is a complaint that carries with it a high risk of complications and morbidity.  The differential diagnosis includes ACS, PE, pneumonia, sepsis, others   Co morbidities that complicate the patient evaluation  Hx MI, PE, HTN, DVT   Additional history obtained:  Additional history obtained from EMS External records from outside source obtained and reviewed including rheumatology notes, patient with RA, on Plaquenil and Humira   Lab Tests:  I Ordered, and personally interpreted labs.  The pertinent results include: Initial troponin  534, D-dimer 0.97, potassium 3.4, unremarkable CBC, normal lactic acid   Imaging Studies ordered:  I ordered imaging studies including CT angio chest PE study, chest x-ray I independently visualized and interpreted imaging which showed  1. No evidence of pulmonary embolism or other acute intrathoracic  findings.  2. Mild thickening of the distal esophagus, which can be seen in the  setting of esophagitis.  3. Aortic and coronary artery atherosclerosis   No acute findings on chest x-ray I agree with the radiologist interpretation   Cardiac Monitoring: / EKG:  The patient was maintained on a cardiac monitor.  I personally viewed and interpreted the cardiac monitored which showed an underlying rhythm of: Sinus tachycardia   Consultations Obtained:  I requested consultation with the cardiologist,  and discussed lab and imaging findings as well as pertinent plan - they recommend: admission to medicine. Continue heparin. Plan for echo, DVT study  I requested consultation with the hospitalist service for admission, Dr. Lanae Boast agrees to see for admission   Problem List / ED Course / Critical interventions / Medication management   I ordered medication including fentanyl for pain, heparin for anticoagulation, aspirin for antiplatelet, LR bolus for fluid resuscitation Reevaluation of the patient after these medicines showed that the patient improved I have reviewed the patients home medicines and have made adjustments as needed    Test / Admission - Considered:  Patient with chest pain and elevated troponin levels. Recently discontinued Eliquis due to "running out." Needs admission for heparin, echo, observation.          Final Clinical Impression(s) / ED Diagnoses Final diagnoses:  Chest pain, unspecified type  Elevated troponin    Rx / DC Orders ED Discharge Orders     None         Pamala Duffel 09/29/22 1048    Tanda Rockers A, DO 09/29/22  1556

## 2022-09-29 NOTE — Progress Notes (Signed)
ANTICOAGULATION CONSULT NOTE   Pharmacy Consult for heparin Indication: chest pain/ACS  Allergies  Allergen Reactions   Codeine Swelling    Other reaction(s): rash   Penicillins Hives and Swelling    Has patient had a PCN reaction causing immediate rash, facial/tongue/throat swelling, SOB or lightheadedness with hypotension: Yes Has patient had a PCN reaction causing severe rash involving mucus membranes or skin necrosis: No Has patient had a PCN reaction that required hospitalization No Has patient had a PCN reaction occurring within the last 10 years: No If all of the above answers are "NO", then may proceed with Cephalosporin use.    Prednisone Hives    Other reaction(s): hives   Wool Alcohol [Lanolin] Hives   Empagliflozin Nausea And Vomiting    Other reaction(s): nausea and vomiting   Penicillin G     Other reaction(s): rash   Statins     Other reaction(s): myopathy    Patient Measurements: Height:  (177.8 cm) Weight: 78.5 kg (173 lb) IBW/kg (Calculated) : 73 Heparin Dosing Weight: TBW  Vital Signs: Temp: 99 F (37.2 C) (04/21 1230) Temp Source: Oral (04/21 1230) BP: 163/90 (04/21 1500) Pulse Rate: 107 (04/21 1515)  Labs: Recent Labs    09/29/22 0654 09/29/22 0914 09/29/22 1800  HGB 14.8  --   --   HCT 42.5  --   --   PLT 277  --   --   APTT 26  --   --   LABPROT 13.3  --   --   INR 1.0  --   --   HEPARINUNFRC  --   --  <0.10*  CREATININE 0.90  --   --   TROPONINIHS 534* 357*  --      Estimated Creatinine Clearance: 74.4 mL/min (by C-G formula based on SCr of 0.9 mg/dL).   Medical History: Past Medical History:  Diagnosis Date   Anginal pain    Arthritis    "fingers" (03/09/2012)   Cold feet    "from my diabetes; they stay cold" (03/09/2012)   Coronary artery disease    Coronary artery spasm    since age 69 Dr Aleen Campi   COVID 07/06/2019   hospitalizied   Dyslipidemia    Femoral DVT (deep venous thrombosis)    left    Headache(784.0)    Hx of gallstones    Dr Derrell Lolling lap cholecystectomy   Hx of plastic surgery    plastic surgery following a fall off a loading dock ,lost  all his upper teeth    Hypertension    MI, old    at age 6   Migraines    Pneumonia ~ 1962; 1970's   "double"; "regular" (03/09/2012)   Rheumatoid arthritis    Dr Dierdre Forth    Slipped cervical disc 06/10/2006   Dr Patsi Sears in the past/ Dr Larita Fife , chiropractor in 2008   Thyroid disease    Dr Lucianne Muss   Type II diabetes mellitus     Assessment: 77 YOM presenting with CP, elevated troponin, CBC wnl, has been on Eliquis for hx of PE however has run out of this and states he has not taken in at least 3 weeks.  Initial heparin level undetectable on 950 units/hr  Goal of Therapy:  Heparin level 0.3-0.7 units/ml Monitor platelets by anticoagulation protocol: Yes   Plan:  Heparin 2000 units IV x 1, and gtt increase to 1100 units/hr F/u 8 hour heparin level  Daylene Posey, PharmD, [facility] Clinical Pharmacist ED Pharmacist  Phone # [phone] 09/29/2022 7:10 PM

## 2022-09-29 NOTE — Hospital Course (Addendum)
77 year old male with history of DVT/PE in 2021, has not had Eliquis since 2 weeks-prescription expired, history of CAD/coronary artery spasm had cardiac cath >>> vasospasm, hypertension, migraine diabetes mellitus,rheumatoid arthritis, slipped vertebral cervical disc presenting with chest pain over the last few weeks. He woke up from sleep earlier this a.m. and episode of chest pain, his usual angina with associated sweating lasting for an hour and resolved.  Pain lasted for 2 hours today and came to ED, In ED: temp 100.9, heart rate 113 BP in 140s to 160s systolic, saturating on room air Labs showed mild hyponatremia, hypokalemia with a stable renal function, troponin elevated at 534 357>, lactic acid 1.9, normal CBC, D-dimer 0.97.Reviewed chest x-ray clear.  CT angio chest no evidence of PE or acute intrathoracic finding, possible esophagitis, aortic and coronary artery atherosclerosis noted Cardiology was consulted, aspirin 81 placed on heparin drip and admission was requested for further management. Patient was admitted for NSTEMI Underwent left heart cath that showed severe three-vessel disease Seen by cardio thoracic surgery> but given his comorbidities felt that he would have serious limitations for recovery following CABG, although limited revascularization best option is probably PCI and medical therapy per [facility]. Underwent PCI with DES to M LAD, and patient is now on Plavix and Eliquis, toprol started 10/03/22. PT OT working with him and recommending skilled nursing facility.

## 2022-09-29 NOTE — ED Notes (Signed)
Lynnae January (Spouse) of Antwann called for update. her number is [phone].

## 2022-09-29 NOTE — Progress Notes (Signed)
ANTICOAGULATION CONSULT NOTE - Initial Consult  Pharmacy Consult for heparin Indication: chest pain/ACS  Allergies  Allergen Reactions   Codeine Swelling    Other reaction(s): rash   Penicillins Hives and Swelling    Has patient had a PCN reaction causing immediate rash, facial/tongue/throat swelling, SOB or lightheadedness with hypotension: Yes Has patient had a PCN reaction causing severe rash involving mucus membranes or skin necrosis: No Has patient had a PCN reaction that required hospitalization No Has patient had a PCN reaction occurring within the last 10 years: No If all of the above answers are "NO", then may proceed with Cephalosporin use.    Prednisone Hives    Other reaction(s): hives   Wool Alcohol [Lanolin] Hives   Empagliflozin Nausea And Vomiting    Other reaction(s): nausea and vomiting   Penicillin G     Other reaction(s): rash   Statins     Other reaction(s): myopathy    Patient Measurements: Height:  (177.8 cm) Weight: 78.5 kg (173 lb) IBW/kg (Calculated) : 73 Heparin Dosing Weight: TBW  Vital Signs: Temp: 100.9 F (38.3 C) (04/21 0643) Temp Source: Oral (04/21 0643) BP: 164/88 (04/21 0830) Pulse Rate: 117 (04/21 0930)  Labs: Recent Labs    09/29/22 0654  HGB 14.8  HCT 42.5  PLT 277  APTT 26  LABPROT 13.3  INR 1.0  CREATININE 0.90  TROPONINIHS 534*    Estimated Creatinine Clearance: 74.4 mL/min (by C-G formula based on SCr of 0.9 mg/dL).   Medical History: Past Medical History:  Diagnosis Date   Anginal pain    Arthritis    "fingers" (03/09/2012)   Cold feet    "from my diabetes; they stay cold" (03/09/2012)   Coronary artery disease    Coronary artery spasm    since age 77 Dr Aleen Campi   COVID 07/06/2019   hospitalizied   Dyslipidemia    Femoral DVT (deep venous thrombosis)    left   Headache(784.0)    Hx of gallstones    Dr Derrell Lolling lap cholecystectomy   Hx of plastic surgery    plastic surgery following a fall off a  loading dock ,lost  all his upper teeth    Hypertension    MI, old    at age 77   Migraines    Pneumonia ~ 77; 1970's   "double"; "regular" (03/09/2012)   Rheumatoid arthritis    Dr Dierdre Forth    Slipped cervical disc 06/10/2006   Dr Patsi Sears in the past/ Dr Larita Fife , chiropractor in 2008   Thyroid disease    Dr Lucianne Muss   Type II diabetes mellitus     Assessment: 77 YOM presenting with CP, elevated troponin, CBC wnl, has been on Eliquis for hx of PE however has run out of this and states he has not taken in at least 3 weeks.  Goal of Therapy:  Heparin level 0.3-0.7 units/ml Monitor platelets by anticoagulation protocol: Yes   Plan:  Heparin 4000 units IV x 1, and gtt at 950 units/hr F/u 8 hour heparin level F/u cards eval and recs  Daylene Posey, PharmD, [facility] Clinical Pharmacist ED Pharmacist Phone # [phone] 09/29/2022 9:49 AM

## 2022-09-29 NOTE — ED Notes (Signed)
.ED TO INPATIENT HANDOFF REPORT  ED Nurse Name and Phone #: [phone]  S Name/Age/Gender William Hunter 77 y.o. male Room/Bed: 027C/027C  Code Status   Code Status: DNR  Home/SNF/Other Home Patient oriented to: self, place, time, and situation Is this baseline? Yes   Triage Complete: Triage complete  Chief Complaint Chest pain [R07.9]  Triage Note Pt arrives EMS with intermittent chest pain over the last few weeks. Pt reports pain got worse today due to feeling shaky. Pt reports increased weakness as well since stopping therapy. Pt reports pain into stomach as well.    Allergies Allergies  Allergen Reactions   Codeine Swelling    Other reaction(s): rash   Penicillins Hives and Swelling    Has patient had a PCN reaction causing immediate rash, facial/tongue/throat swelling, SOB or lightheadedness with hypotension: Yes Has patient had a PCN reaction causing severe rash involving mucus membranes or skin necrosis: No Has patient had a PCN reaction that required hospitalization No Has patient had a PCN reaction occurring within the last 10 years: No If all of the above answers are "NO", then may proceed with Cephalosporin use.    Prednisone Hives    Other reaction(s): hives   Wool Alcohol [Lanolin] Hives   Empagliflozin Nausea And Vomiting    Other reaction(s): nausea and vomiting   Penicillin G     Other reaction(s): rash   Statins     Other reaction(s): myopathy    Level of Care/Admitting Diagnosis ED Disposition     ED Disposition  Admit   Condition  --   Comment  Hospital Area: [facility] [[id number]]  Level of Care: Progressive [102]  Admit to Progressive based on following criteria: CARDIOVASCULAR & THORACIC of moderate stability with acute coronary syndrome symptoms/low risk myocardial infarction/hypertensive urgency/arrhythmias/heart failure potentially compromising stability and stable post cardiovascular intervention patients.  May place  patient in observation at [facility] or [facility] if equivalent level of care is available:: No  Covid Evaluation: Asymptomatic - no recent exposure (last 10 days) testing not required  Diagnosis: Chest pain [191478]  Admitting Physician: Lanae Boast [[id number]]  Attending Physician: Lanae Boast [[id number]]          B Medical/Surgery History Past Medical History:  Diagnosis Date   Anginal pain    Arthritis    "fingers" (03/09/2012)   Cold feet    "from my diabetes; they stay cold" (03/09/2012)   Coronary artery disease    Coronary artery spasm    since age 80 Dr Aleen Campi   COVID 07/06/2019   hospitalizied   Dyslipidemia    Femoral DVT (deep venous thrombosis)    left   Headache(784.0)    Hx of gallstones    Dr Derrell Lolling lap cholecystectomy   Hx of plastic surgery    plastic surgery following a fall off a loading dock ,lost  all his upper teeth    Hypertension    MI, old    at age 52   Migraines    Pneumonia ~ 1962; 1970's   "double"; "regular" (03/09/2012)   Rheumatoid arthritis    Dr Dierdre Forth    Slipped cervical disc 06/10/2006   Dr Patsi Sears in the past/ Dr Larita Fife , chiropractor in 2008   Thyroid disease    Dr Lucianne Muss   Type II diabetes mellitus    Past Surgical History:  Procedure Laterality Date   CARDIAC CATHETERIZATION     x 3 with NO stents    CHOLECYSTECTOMY  03/11/2012   Procedure: LAPAROSCOPIC CHOLECYSTECTOMY WITH INTRAOPERATIVE CHOLANGIOGRAM;  Surgeon: Ernestene Mention, MD;  Location: [facility] OR;  Service: General;  Laterality: N/A;   KYPHOPLASTY N/A 05/30/2021   Procedure: THORACIC FIVE KYPHOPLASTY;  Surgeon: Coletta Memos, MD;  Location: [facility] OR;  Service: Neurosurgery;  Laterality: N/A;  THORACIC FIVE KYPHOPLASTY   PENILE PROSTHESIS IMPLANT  1983     A IV Location/Drains/Wounds Patient Lines/Drains/Airways Status     Active Line/Drains/Airways     Name Placement date Placement time Site Days   Peripheral IV 09/29/22 Anterior;Distal;Right Forearm 09/29/22   0702  Forearm  less than 1   Peripheral IV 09/29/22 20 G Anterior;Proximal;Right Forearm 09/29/22  0900  Forearm  less than 1   Incision (Closed) 05/30/21 Back 05/30/21  1450  -- 487            Intake/Output Last 24 hours No intake or output data in the 24 hours ending 09/29/22 1847  Labs/Imaging Results for orders placed or performed during the hospital encounter of 09/29/22 (from the past 48 hour(s))  Lactic acid, plasma     Status: None   Collection Time: 09/29/22  6:54 AM  Result Value Ref Range   Lactic Acid, Venous 1.9 0.5 - 1.9 mmol/L    Comment: Performed at [facility], [address] [place], [place] [address]  Comprehensive metabolic panel     Status: Abnormal   Collection Time: 09/29/22  6:54 AM  Result Value Ref Range   Sodium 134 (L) 135 - 145 mmol/L   Potassium 3.4 (L) 3.5 - 5.1 mmol/L   Chloride 101 98 - 111 mmol/L   CO2 24 22 - 32 mmol/L   Glucose, Bld 185 (H) 70 - 99 mg/dL    Comment: Glucose reference range applies only to samples taken after fasting for at least 8 hours.   BUN 21 8 - 23 mg/dL   Creatinine, Ser 6.04 0.61 - 1.24 mg/dL   Calcium 8.5 (L) 8.9 - 10.3 mg/dL   Total Protein 7.4 6.5 - 8.1 g/dL   Albumin 3.1 (L) 3.5 - 5.0 g/dL   AST 22 15 - 41 U/L   ALT 19 0 - 44 U/L   Alkaline Phosphatase 59 38 - 126 U/L   Total Bilirubin 0.6 0.3 - 1.2 mg/dL   GFR, Estimated >54 >09 mL/min    Comment: (NOTE) Calculated using the CKD-EPI Creatinine Equation (2021)    Anion gap 9 5 - 15    Comment: Performed at [facility], [address], [place], [place] [address]  CBC with Differential     Status: Abnormal   Collection Time: 09/29/22  6:54 AM  Result Value Ref Range   WBC 9.5 4.0 - 10.5 K/uL   RBC 4.70 4.22 - 5.81 MIL/uL   Hemoglobin 14.8 13.0 - 17.0 g/dL   HCT 47.8 29.5 - 62.1 %   MCV 90.4 80.0 - 100.0 fL   MCH 31.5 26.0 - 34.0 pg   MCHC 34.8 30.0 - 36.0 g/dL   RDW 30.8 65.7 - 84.6 %   Platelets 277 150 - 400 K/uL   nRBC 0.0 0.0 -  0.2 %   Neutrophils Relative % 86 %   Neutro Abs 8.1 (H) 1.7 - 7.7 K/uL   Lymphocytes Relative 8 %   Lymphs Abs 0.8 0.7 - 4.0 K/uL   Monocytes Relative 6 %   Monocytes Absolute 0.5 0.1 - 1.0 K/uL   Eosinophils Relative 0 %  Eosinophils Absolute 0.0 0.0 - 0.5 K/uL   Basophils Relative 0 %   Basophils Absolute 0.0 0.0 - 0.1 K/uL   Immature Granulocytes 0 %   Abs Immature Granulocytes 0.04 0.00 - 0.07 K/uL    Comment: Performed at [facility], [address], [place], [place] [address]  Protime-INR     Status: None   Collection Time: 09/29/22  6:54 AM  Result Value Ref Range   Prothrombin Time 13.3 11.4 - 15.2 seconds   INR 1.0 0.8 - 1.2    Comment: (NOTE) INR goal varies based on device and disease states. Performed at [facility], [address] [place], [place] [address]   APTT     Status: None   Collection Time: 09/29/22  6:54 AM  Result Value Ref Range   aPTT 26 24 - 36 seconds    Comment: Performed at [facility], [address], [place], [place] [address]  Troponin I (High Sensitivity)     Status: Abnormal   Collection Time: 09/29/22  6:54 AM  Result Value Ref Range   Troponin I (High Sensitivity) 534 (HH) <18 ng/L    Comment: CRITICAL RESULT CALLED TO, READ BACK BY AND VERIFIED WITH M. Janeah Kovacich RN 09/29/22  BY J. WHITE (NOTE) Elevated high sensitivity troponin I (hsTnI) values and significant  changes across serial measurements may suggest ACS but many other  chronic and acute conditions are known to elevate hsTnI results.  Refer to the "Links" section for chest pain algorithms and additional  guidance. Performed at [facility], [address] [place], [place] [address]   D-dimer, quantitative     Status: Abnormal   Collection Time: 09/29/22  6:54 AM  Result Value Ref Range   D-Dimer, Quant 0.97 (H) 0.00 - 0.50 ug/mL-FEU    Comment: (NOTE) At the manufacturer cut-off value of 0.5 g/mL FEU, this assay has a negative predictive  value of 95-100%.This assay is intended for use in conjunction with a clinical pretest probability (PTP) assessment model to exclude pulmonary embolism (PE) and deep venous thrombosis (DVT) in outpatients suspected of PE or DVT. Results should be correlated with clinical presentation. Performed at [facility], [address], [place], [place] [address]   Troponin I (High Sensitivity)     Status: Abnormal   Collection Time: 09/29/22  9:14 AM  Result Value Ref Range   Troponin I (High Sensitivity) 357 (HH) <18 ng/L    Comment: CRITICAL VALUE NOTED. VALUE IS CONSISTENT WITH PREVIOUSLY REPORTED/CALLED VALUE (NOTE) Elevated high sensitivity troponin I (hsTnI) values and significant  changes across serial measurements may suggest ACS but many other  chronic and acute conditions are known to elevate hsTnI results.  Refer to the "Links" section for chest pain algorithms and additional  guidance. Performed at [facility], [address], [place], [place] [address]   CBG monitoring, ED     Status: Abnormal   Collection Time: 09/29/22 12:23 PM  Result Value Ref Range   Glucose-Capillary 129 (H) 70 - 99 mg/dL    Comment: Glucose reference range applies only to samples taken after fasting for at least 8 hours.  TSH     Status: Abnormal   Collection Time: 09/29/22 12:24 PM  Result Value Ref Range   TSH 0.130 (L) 0.350 - 4.500 uIU/mL    Comment: Performed by a 3rd Generation assay with a functional sensitivity of <=0.01 uIU/mL. Performed at [facility]  Lab, [address], [place], [place] [address]   CBG monitoring, ED     Status: Abnormal   Collection Time: 09/29/22  5:35 PM  Result Value Ref Range   Glucose-Capillary 107 (H) 70 - 99 mg/dL    Comment: Glucose reference range applies only to samples taken after fasting for at least 8 hours.  Heparin level (unfractionated)     Status: Abnormal   Collection Time: 09/29/22  6:00 PM  Result Value Ref Range   Heparin  Unfractionated <0.10 (L) 0.30 - 0.70 IU/mL    Comment: (NOTE) The clinical reportable range upper limit is being lowered to >1.10 to align with the FDA approved guidance for the current laboratory assay.  If heparin results are below expected values, and patient dosage has  been confirmed, suggest follow up testing of antithrombin III levels. Performed at [facility], [address] [place], [place] [address]    CT Angio Chest PE W and/or Wo Contrast  Result Date: 09/29/2022 CLINICAL DATA:  Pulmonary embolism (PE) suspected, low to intermediate prob, positive D-dimer EXAM: CT ANGIOGRAPHY CHEST WITH CONTRAST TECHNIQUE: Multidetector CT imaging of the chest was performed using the standard protocol during bolus administration of intravenous contrast. Multiplanar CT image reconstructions and MIPs were obtained to evaluate the vascular anatomy. RADIATION DOSE REDUCTION: This exam was performed according to the departmental dose-optimization program which includes automated exposure control, adjustment of the mA and/or kV according to patient size and/or use of iterative reconstruction technique. CONTRAST:  75mL OMNIPAQUE IOHEXOL 350 MG/ML SOLN COMPARISON:  11/26/2021 FINDINGS: Cardiovascular: Satisfactory opacification of the pulmonary arteries to the segmental level. No evidence of pulmonary embolism. Thoracic aorta is nonaneurysmal. Atherosclerotic vascular calcifications of the aorta and coronary arteries. Normal heart size. No pericardial effusion. Mediastinum/Nodes: No enlarged mediastinal, hilar, or axillary lymph nodes. Thyroid gland and trachea demonstrate no significant findings. Distal esophagus is mildly thickened. Lungs/Pleura: Passive atelectasis within the dependent lung fields. No focal consolidation. No pleural effusion or pneumothorax. Upper Abdomen: No acute abnormality. Musculoskeletal: No chest wall abnormality. No acute or significant osseous findings. Prior cement augmentation  of the T5 vertebral body. Review of the MIP images confirms the above findings. IMPRESSION: 1. No evidence of pulmonary embolism or other acute intrathoracic findings. 2. Mild thickening of the distal esophagus, which can be seen in the setting of esophagitis. 3. Aortic and coronary artery atherosclerosis (ICD10-I70.0). Electronically Signed   By: Duanne Guess D.O.   On: 09/29/2022 09:15   DG Chest Port 1 View  Result Date: 09/29/2022 CLINICAL DATA:  Sepsis. EXAM: PORTABLE CHEST 1 VIEW COMPARISON:  11/24/2021 FINDINGS: The heart size and mediastinal contours are within normal limits. Both lungs are clear. Previous midthoracic spine vertebroplasty noted. IMPRESSION: No active disease. Electronically Signed   By: Danae Orleans M.D.   On: 09/29/2022 08:03    Pending Labs Unresulted Labs (From admission, onward)     Start     Ordered   09/30/22 0500  Heparin level (unfractionated)  Daily,   R     See Hyperspace for full Linked Orders Report.   09/29/22 0952   09/30/22 0500  CBC  Daily,   R     See Hyperspace for full Linked Orders Report.   09/29/22 0952   09/30/22 0500  Comprehensive metabolic panel  Daily,   R      09/29/22 1045   09/30/22 0500  CBC  Daily,   R      09/29/22 1045  09/29/22 1800  APTT  Once-Timed,   STAT       See Hyperspace for full Linked Orders Report.   09/29/22 0952   09/29/22 1115  Respiratory (~20 pathogens) panel by PCR  (Respiratory panel by PCR (~20 pathogens, ~24 hr TAT)  w precautions)  Once,   R        09/29/22 1114   09/29/22 1103  Hemoglobin A1c  (Glycemic Control (SSI)  Q 4 Hours / Glycemic Control (SSI)  AC +/- HS)  Add-on,   AD       Comments: To assess prior glycemic control    09/29/22 1104   09/29/22 1103  Brain natriuretic peptide  Once,   R        09/29/22 1104   09/29/22 0714  Lactic acid, plasma  (Undifferentiated presentation (screening labs and basic nursing orders))  Now then every 2 hours,   R (with STAT occurrences)      09/29/22 0714    09/29/22 0714  Blood Culture (routine x 2)  (Undifferentiated presentation (screening labs and basic nursing orders))  BLOOD CULTURE X 2,   STAT      09/29/22 0714   09/29/22 0714  Urinalysis, Routine w reflex microscopic -Urine, Clean Catch  (Undifferentiated presentation (screening labs and basic nursing orders))  ONCE - URGENT,   URGENT       Question:  Specimen Source  Answer:  Urine, Clean Catch   09/29/22 0714            Vitals/Pain Today's Vitals   09/29/22 1445 09/29/22 1500 09/29/22 1515 09/29/22 1655  BP:  (!) 163/90    Pulse: (!) 116 (!) 110 (!) 107   Resp: 15 (!) 21 19   Temp:      TempSrc:      SpO2: 100% 99% 100%   Weight:      Height:      PainSc:    0-No pain    Isolation Precautions Droplet precaution  Medications Medications  aspirin tablet 325 mg (325 mg Oral Given 09/29/22 0836)  heparin ADULT infusion 100 units/mL (25000 units/244mL) (950 Units/hr Intravenous New Bag/Given 09/29/22 1003)  insulin aspart (novoLOG) injection 0-9 Units ( Subcutaneous Not Given 09/29/22 1737)  nitroGLYCERIN (NITROSTAT) SL tablet 0.4 mg (has no administration in time range)  acetaminophen (TYLENOL) tablet 650 mg (has no administration in time range)  amLODipine (NORVASC) tablet 5 mg (5 mg Oral Given 09/29/22 1648)  carbidopa-levodopa (SINEMET IR) 25-100 MG per tablet immediate release 1 tablet (1 tablet Oral Given 09/29/22 1649)  folic acid (FOLVITE) tablet 1 mg (1 mg Oral Given 09/29/22 1649)  hydroxychloroquine (PLAQUENIL) tablet 200 mg (200 mg Oral Given 09/29/22 1658)  meclizine (ANTIVERT) tablet 25 mg (has no administration in time range)  mirabegron ER (MYRBETRIQ) tablet 25 mg (25 mg Oral Given 09/29/22 1657)  lactated ringers bolus 1,000 mL (0 mLs Intravenous Stopped 09/29/22 1117)  fentaNYL (SUBLIMAZE) injection 50 mcg (50 mcg Intravenous Given 09/29/22 0837)  iohexol (OMNIPAQUE) 350 MG/ML injection 75 mL (75 mLs Intravenous Contrast Given 09/29/22 0907)  heparin bolus via  infusion 4,000 Units (4,000 Units Intravenous Bolus from Bag 09/29/22 1003)  ondansetron (ZOFRAN) injection 4 mg (4 mg Intravenous Given 09/29/22 1027)  potassium chloride SA (KLOR-CON M) CR tablet 40 mEq (40 mEq Oral Given 09/29/22 1648)    Mobility non-ambulatory     Focused Assessments Cardiac Assessment Handoff:  Cardiac Rhythm: Normal sinus rhythm Lab Results  Component Value Date  CKTOTAL 20 (L) 11/25/2021   CKMB 1.8 08/07/2007   TROPONINI <0.03 05/02/2016   Lab Results  Component Value Date   DDIMER 0.97 (H) 09/29/2022   Does the Patient currently have chest pain? No   , Neuro Assessment Handoff:  Swallow screen pass? Yes  Cardiac Rhythm: Normal sinus rhythm       Neuro Assessment:   Neuro Checks:      Has TPA been given? No If patient is a Neuro Trauma and patient is going to OR before floor call report to 4N Charge nurse: [phone] or [phone]   R Recommendations: See Admitting Provider Note  Report given to:   Additional Notes:

## 2022-09-30 ENCOUNTER — Inpatient Hospital Stay (HOSPITAL_COMMUNITY)
Admission: EM | Disposition: A | Discharge status: Skilled Nursing Facility | Payer: Self-pay | Source: Home / Self Care | Attending: Internal Medicine

## 2022-09-30 ENCOUNTER — Other Ambulatory Visit: Payer: Self-pay | Admitting: *Deleted

## 2022-09-30 ENCOUNTER — Observation Stay (HOSPITAL_COMMUNITY): Payer: Medicare HMO

## 2022-09-30 ENCOUNTER — Telehealth: Payer: Self-pay

## 2022-09-30 ENCOUNTER — Encounter (HOSPITAL_COMMUNITY): Payer: Self-pay | Admitting: Interventional Cardiology

## 2022-09-30 DIAGNOSIS — Y92009 Unspecified place in unspecified non-institutional (private) residence as the place of occurrence of the external cause: Secondary | ICD-10-CM | POA: Diagnosis not present

## 2022-09-30 DIAGNOSIS — E871 Hypo-osmolality and hyponatremia: Secondary | ICD-10-CM | POA: Diagnosis present

## 2022-09-30 DIAGNOSIS — I214 Non-ST elevation (NSTEMI) myocardial infarction: Principal | ICD-10-CM

## 2022-09-30 DIAGNOSIS — M069 Rheumatoid arthritis, unspecified: Secondary | ICD-10-CM

## 2022-09-30 DIAGNOSIS — I2510 Atherosclerotic heart disease of native coronary artery without angina pectoris: Secondary | ICD-10-CM

## 2022-09-30 DIAGNOSIS — R7989 Other specified abnormal findings of blood chemistry: Secondary | ICD-10-CM | POA: Diagnosis present

## 2022-09-30 DIAGNOSIS — Z91148 Patient's other noncompliance with medication regimen for other reason: Secondary | ICD-10-CM | POA: Diagnosis not present

## 2022-09-30 DIAGNOSIS — E1165 Type 2 diabetes mellitus with hyperglycemia: Secondary | ICD-10-CM | POA: Diagnosis present

## 2022-09-30 DIAGNOSIS — Z888 Allergy status to other drugs, medicaments and biological substances status: Secondary | ICD-10-CM | POA: Diagnosis not present

## 2022-09-30 DIAGNOSIS — E8809 Other disorders of plasma-protein metabolism, not elsewhere classified: Secondary | ICD-10-CM | POA: Diagnosis present

## 2022-09-30 DIAGNOSIS — Z66 Do not resuscitate: Secondary | ICD-10-CM | POA: Diagnosis present

## 2022-09-30 DIAGNOSIS — E876 Hypokalemia: Secondary | ICD-10-CM | POA: Diagnosis present

## 2022-09-30 DIAGNOSIS — T45516A Underdosing of anticoagulants, initial encounter: Secondary | ICD-10-CM | POA: Diagnosis present

## 2022-09-30 DIAGNOSIS — K209 Esophagitis, unspecified without bleeding: Secondary | ICD-10-CM | POA: Diagnosis present

## 2022-09-30 DIAGNOSIS — Z8616 Personal history of COVID-19: Secondary | ICD-10-CM | POA: Diagnosis not present

## 2022-09-30 DIAGNOSIS — D72829 Elevated white blood cell count, unspecified: Secondary | ICD-10-CM | POA: Diagnosis present

## 2022-09-30 DIAGNOSIS — R079 Chest pain, unspecified: Secondary | ICD-10-CM

## 2022-09-30 DIAGNOSIS — Z86718 Personal history of other venous thrombosis and embolism: Secondary | ICD-10-CM | POA: Diagnosis not present

## 2022-09-30 DIAGNOSIS — Z794 Long term (current) use of insulin: Secondary | ICD-10-CM | POA: Diagnosis not present

## 2022-09-30 DIAGNOSIS — I10 Essential (primary) hypertension: Secondary | ICD-10-CM

## 2022-09-30 DIAGNOSIS — E785 Hyperlipidemia, unspecified: Secondary | ICD-10-CM | POA: Diagnosis present

## 2022-09-30 DIAGNOSIS — Z79899 Other long term (current) drug therapy: Secondary | ICD-10-CM | POA: Diagnosis not present

## 2022-09-30 DIAGNOSIS — I700 Atherosclerosis of aorta: Secondary | ICD-10-CM | POA: Diagnosis present

## 2022-09-30 DIAGNOSIS — Z79620 Long term (current) use of immunosuppressive biologic: Secondary | ICD-10-CM | POA: Diagnosis not present

## 2022-09-30 DIAGNOSIS — Z885 Allergy status to narcotic agent status: Secondary | ICD-10-CM | POA: Diagnosis not present

## 2022-09-30 DIAGNOSIS — Z86711 Personal history of pulmonary embolism: Secondary | ICD-10-CM

## 2022-09-30 DIAGNOSIS — Z880 Allergy status to penicillin: Secondary | ICD-10-CM | POA: Diagnosis not present

## 2022-09-30 HISTORY — PX: LEFT HEART CATH AND CORONARY ANGIOGRAPHY: CATH118249

## 2022-09-30 LAB — COMPREHENSIVE METABOLIC PANEL
ALT: 5 U/L (ref 0–44)
AST: 17 U/L (ref 15–41)
Albumin: 2.7 g/dL — ABNORMAL LOW (ref 3.5–5.0)
Alkaline Phosphatase: 62 U/L (ref 38–126)
Anion gap: 15 (ref 5–15)
BUN: 12 mg/dL (ref 8–23)
CO2: 22 mmol/L (ref 22–32)
Calcium: 8.7 mg/dL — ABNORMAL LOW (ref 8.9–10.3)
Chloride: 97 mmol/L — ABNORMAL LOW (ref 98–111)
Creatinine, Ser: 0.8 mg/dL (ref 0.61–1.24)
GFR, Estimated: >60 mL/min (ref >60)
Glucose, Bld: 119 mg/dL — ABNORMAL HIGH (ref 70–99)
Potassium: 3.6 mmol/L (ref 3.5–5.1)
Sodium: 134 mmol/L — ABNORMAL LOW (ref 135–145)
Total Bilirubin: 0.8 mg/dL (ref 0.3–1.2)
Total Protein: 6.6 g/dL (ref 6.5–8.1)

## 2022-09-30 LAB — CULTURE, BLOOD (ROUTINE X 2): Culture: NO GROWTH

## 2022-09-30 LAB — GLUCOSE, CAPILLARY
Glucose-Capillary: 110 mg/dL — ABNORMAL HIGH (ref 70–99)
Glucose-Capillary: 115 mg/dL — ABNORMAL HIGH (ref 70–99)
Glucose-Capillary: 123 mg/dL — ABNORMAL HIGH (ref 70–99)
Glucose-Capillary: 235 mg/dL — ABNORMAL HIGH (ref 70–99)
Glucose-Capillary: 219 mg/dL — ABNORMAL HIGH (ref 70–99)

## 2022-09-30 LAB — CBC
HCT: 39.9 % (ref 39.0–52.0)
Hemoglobin: 13.9 g/dL (ref 13.0–17.0)
MCH: 31.8 pg (ref 26.0–34.0)
MCHC: 34.8 g/dL (ref 30.0–36.0)
MCV: 91.3 fL (ref 80.0–100.0)
Platelets: 218 10*3/uL (ref 150–400)
RBC: 4.37 MIL/uL (ref 4.22–5.81)
RDW: 13.1 % (ref 11.5–15.5)
WBC: 15.5 10*3/uL — ABNORMAL HIGH (ref 4.0–10.5)
nRBC: 0 % (ref 0.0–0.2)

## 2022-09-30 LAB — ECHOCARDIOGRAM COMPLETE
AV Mean grad: 3 mmHg
AV Peak grad: 5.1 mmHg
Ao pk vel: 1.13 m/s
Area-P 1/2: 2.73 cm2
Height: 70 in
S' Lateral: 3.5 cm
Weight: 2779.56 oz

## 2022-09-30 LAB — OCCULT BLOOD X 1 CARD TO LAB, STOOL: Fecal Occult Bld: POSITIVE — AB

## 2022-09-30 LAB — HEPARIN LEVEL (UNFRACTIONATED): Heparin Unfractionated: 0.2 IU/mL — ABNORMAL LOW (ref 0.30–0.70)

## 2022-09-30 SURGERY — LEFT HEART CATH AND CORONARY ANGIOGRAPHY
Anesthesia: LOCAL

## 2022-09-30 MED ORDER — ACETAMINOPHEN 325 MG PO TABS: 650.0000 mg | ORAL_TABLET | ORAL | Status: DC | PRN

## 2022-09-30 MED ORDER — FENTANYL CITRATE (PF) 100 MCG/2ML IJ SOLN
INTRAMUSCULAR | Status: DC | PRN
  Administered 2022-09-30: 25 ug via INTRAVENOUS

## 2022-09-30 MED ORDER — SODIUM CHLORIDE 0.9 % IV SOLN: INTRAVENOUS | Status: AC

## 2022-09-30 MED ORDER — FENTANYL CITRATE (PF) 100 MCG/2ML IJ SOLN
INTRAMUSCULAR | Status: AC
  Filled 2022-09-30: qty 2

## 2022-09-30 MED ORDER — SODIUM CHLORIDE 0.9 % IV SOLN: 250.0000 mL | INTRAVENOUS | Status: DC | PRN

## 2022-09-30 MED ORDER — VERAPAMIL HCL 2.5 MG/ML IV SOLN
INTRAVENOUS | Status: DC | PRN
  Administered 2022-09-30: 10 mL via INTRA_ARTERIAL

## 2022-09-30 MED ORDER — HEPARIN SODIUM (PORCINE) 1000 UNIT/ML IJ SOLN
INTRAMUSCULAR | Status: AC
  Filled 2022-09-30: qty 10

## 2022-09-30 MED ORDER — INSULIN ASPART 100 UNIT/ML IJ SOLN
0.0000 [IU] | Freq: Three times a day (TID) | INTRAMUSCULAR | Status: DC
  Administered 2022-10-01: 2 [IU] via SUBCUTANEOUS
  Administered 2022-10-01: 5 [IU] via SUBCUTANEOUS
  Administered 2022-10-01: 3 [IU] via SUBCUTANEOUS
  Administered 2022-10-02 (×2): 2 [IU] via SUBCUTANEOUS
  Administered 2022-10-02: 1 [IU] via SUBCUTANEOUS
  Administered 2022-10-03: 5 [IU] via SUBCUTANEOUS
  Administered 2022-10-03: 3 [IU] via SUBCUTANEOUS

## 2022-09-30 MED ORDER — LIDOCAINE HCL (PF) 1 % IJ SOLN
INTRAMUSCULAR | Status: DC | PRN
  Administered 2022-09-30: 15 mL
  Administered 2022-09-30: 2 mL

## 2022-09-30 MED ORDER — LABETALOL HCL 5 MG/ML IV SOLN: 10.0000 mg | INTRAVENOUS | Status: AC | PRN

## 2022-09-30 MED ORDER — HEPARIN (PORCINE) IN NACL 1000-0.9 UT/500ML-% IV SOLN
INTRAVENOUS | Status: DC | PRN
  Administered 2022-09-30 (×2): 500 mL

## 2022-09-30 MED ORDER — IOHEXOL 350 MG/ML SOLN
INTRAVENOUS | Status: DC | PRN
  Administered 2022-09-30: 50 mL

## 2022-09-30 MED ORDER — MIDAZOLAM HCL 2 MG/2ML IJ SOLN
INTRAMUSCULAR | Status: AC
  Filled 2022-09-30: qty 2

## 2022-09-30 MED ORDER — SODIUM CHLORIDE 0.9% FLUSH: 3.0000 mL | INTRAVENOUS | Status: DC | PRN

## 2022-09-30 MED ORDER — ASPIRIN 81 MG PO CHEW
81.0000 mg | CHEWABLE_TABLET | Freq: Every day | ORAL | Status: DC
  Administered 2022-10-01: 81 mg via ORAL
  Filled 2022-09-30 (×2): qty 1

## 2022-09-30 MED ORDER — MIDAZOLAM HCL 2 MG/2ML IJ SOLN
INTRAMUSCULAR | Status: DC | PRN
  Administered 2022-09-30 (×2): 1 mg via INTRAVENOUS

## 2022-09-30 MED ORDER — ONDANSETRON HCL 4 MG/2ML IJ SOLN: 4.0000 mg | Freq: Four times a day (QID) | INTRAMUSCULAR | Status: DC | PRN

## 2022-09-30 MED ORDER — VERAPAMIL HCL 2.5 MG/ML IV SOLN
INTRAVENOUS | Status: AC
  Filled 2022-09-30: qty 2

## 2022-09-30 MED ORDER — HYDRALAZINE HCL 20 MG/ML IJ SOLN: 10.0000 mg | INTRAMUSCULAR | Status: AC | PRN

## 2022-09-30 MED ORDER — LIDOCAINE HCL (PF) 1 % IJ SOLN
INTRAMUSCULAR | Status: AC
  Filled 2022-09-30: qty 30

## 2022-09-30 MED ORDER — HUMIRA (2 PEN) 40 MG/0.4ML ~~LOC~~ AJKT
0.4000 mL | AUTO-INJECTOR | SUBCUTANEOUS | 2 refills | Status: DC
Start: 2022-09-30 — End: 2023-03-17

## 2022-09-30 MED ORDER — INSULIN ASPART 100 UNIT/ML IJ SOLN
0.0000 [IU] | Freq: Every day | INTRAMUSCULAR | Status: DC
  Administered 2022-09-30 – 2022-10-01 (×2): 2 [IU] via SUBCUTANEOUS

## 2022-09-30 MED ORDER — SODIUM CHLORIDE 0.9% FLUSH
3.0000 mL | Freq: Two times a day (BID) | INTRAVENOUS | Status: DC
  Administered 2022-10-01 – 2022-10-02 (×4): 3 mL via INTRAVENOUS

## 2022-09-30 SURGICAL SUPPLY — 17 items
CATH 5FR JL3.5 JR4 ANG PIG MP (CATHETERS) IMPLANT
CATH INFINITI 5FR JL4 (CATHETERS) IMPLANT
CLOSURE MYNX CONTROL 5F (Vascular Products) IMPLANT
DEVICE RAD COMP TR BAND LRG (VASCULAR PRODUCTS) IMPLANT
GLIDESHEATH SLEND SS 6F .021 (SHEATH) IMPLANT
GUIDEWIRE INQWIRE 1.5J.035X260 (WIRE) IMPLANT
INQWIRE 1.5J .035X260CM (WIRE) ×1
KIT HEART LEFT (KITS) ×2 IMPLANT
KIT MICROPUNCTURE NIT STIFF (SHEATH) IMPLANT
PACK CARDIAC CATHETERIZATION (CUSTOM PROCEDURE TRAY) ×2 IMPLANT
SHEATH 6FR 75 DEST SLENDER (SHEATH) IMPLANT
SHEATH PINNACLE 5F 10CM (SHEATH) IMPLANT
SHEATH PROBE COVER 6X72 (BAG) IMPLANT
TRANSDUCER W/STOPCOCK (MISCELLANEOUS) ×2 IMPLANT
TUBING CIL FLEX 10 FLL-RA (TUBING) ×2 IMPLANT
WIRE EMERALD 3MM-J .035X150CM (WIRE) IMPLANT
WIRE HI TORQ VERSACORE-J 145CM (WIRE) IMPLANT

## 2022-09-30 NOTE — Interval H&P Note (Signed)
Cath Lab Visit (complete for each Cath Lab visit)  Clinical Evaluation Leading to the Procedure:   ACS: Yes.    Non-ACS:    Anginal Classification: CCS IV  Anti-ischemic medical therapy: Minimal Therapy (1 class of medications)  Non-Invasive Test Results: No non-invasive testing performed  Prior CABG: No previous CABG   Coronary artery calficiation   History and Physical Interval Note:  09/30/2022 9:06 AM  William Hunter  has presented today for surgery, with the diagnosis of NSTEMI.  The various methods of treatment have been discussed with the patient and family. After consideration of risks, benefits and other options for treatment, the patient has consented to  Procedure(s): LEFT HEART CATH AND CORONARY ANGIOGRAPHY (N/A) as a surgical intervention.  The patient's history has been reviewed, patient examined, no change in status, stable for surgery.  I have reviewed the patient's chart and labs.  Questions were answered to the patient's satisfaction.     Lance Muss

## 2022-09-30 NOTE — Progress Notes (Signed)
Lab results showed very high markers of inflammation se rate 38 and CRP 77.9. White blood cell count was also high at 20.9. Metabolic panel normal and negative TB screening so no specific issue for continuing his RA medications.  This very high CRP is suggestive for possibly having some kind of infection contributing to his feeling poorly or in some cases can indicate a vascular problems. I see he was also being evaluated at the ED, if needing to take antibiotics would wait to complete them before taking next Humira dose.

## 2022-09-30 NOTE — H&P (Deleted)
PROGRESS NOTE William Hunter  NWG:[id number] DOB: Feb 29, 1946 DOA: 09/29/2022 PCP: Irena Reichmann, DO  Brief Narrative/Hospital Course: 77 year old male with history of DVT/PE in 2021, has not had Eliquis since 2 weeks-prescription expired, history of CAD/coronary artery spasm had cardiac cath >>> vasospasm, hypertension, migraine diabetes mellitus,rheumatoid arthritis, slipped vertebral cervical disc presenting with chest pain over the last few weeks. He woke up from sleep earlier this a.m. and episode of chest pain, his usual angina with associated sweating lasting for an hour and resolved.  Pain lasted for 2 hours today and came to ED, In ED: temp 100.9, heart rate 113 BP in 140s to 160s systolic, saturating on room air Labs showed mild hyponatremia, hypokalemia with a stable renal function, troponin elevated at 534 357>, lactic acid 1.9, normal CBC, D-dimer 0.97.Reviewed chest x-ray clear.  CT angio chest no evidence of PE or acute intrathoracic finding, possible esophagitis, aortic and coronary artery atherosclerosis noted Cardiology was consulted, aspirin 81 placed on heparin drip and admission was requested for further management. Followed closely cardiology with plan for left heart cath     Subjective: Seen and examined this morning no chest pain resting comfortably On the way to cardiac cath No acute events overnight Assessment and Plan: Principal Problem:   Chest pain Active Problems:   Coronary artery disease   Hypertension   Dyslipidemia   Coronary vasospasm ([name]   History of pulmonary embolism   NSTEMI CAD-coronary artery atherosclerosis-coronary spasm history: Elevated Troponin: concerning for nstemi, On admission placed on heparin drip seen by cardiology, this morning underwent cardiac cath > that showed multiple CAD lesions moderate disease in the distal left main ostial LAD and ostial circumflex severe disease in the large ramus and mid LAD, surgical consultation requested  for CABG. continue on aspirin 81, check lipid profile, follow-up echocardiogram and further cardiology plan  Dyslipidemia: Check fasting lipid profile   Hypertension: BP well-controlled continue amlodipine    Dyslipidemia: Check fasting lipid profile   Esophagitis per CT scan: Continue PPI   Low-grade fever: Unclear etiology respiratory virus panel unremarkable chest x-ray and CT imaging no acute lung findings.  It has resolved.  He does not have respiratory symptoms. Monitor.     Hypokalemia repleted   History of pulmonary embolism/VTE/ positive D-dimer:Now on heparin drip, d dimer slightly up> currently on heparin drip precath and held   T2DM: A1c 6.7, blood sugar well-controlled continue SSI.   Recent Labs  Lab 09/29/22 1223 09/29/22 1735 09/29/22 1926 09/30/22 0841  GLUCAP 129* 107*  --  110*  HGBA1C  --   --  6.7*  --      RA: On Humira and Plaquenil followed by rheumatology as outpatient  Code status:DNR.  DVT prophylaxis: heparin gtt Code Status:   Code Status: DNR Family Communication: plan of care discussed with patient at bedside. Patient status is:  inpatient  because of further cardiac workup Level of care: Progressive   Dispo: The patient is from: home            Anticipated disposition: tbd Objective: Vitals last 24 hrs: Vitals:   09/29/22 1915 09/30/22 0045 09/30/22 0406 09/30/22 0802  BP: 139/78 139/84 139/85 131/85  Pulse: (!) 102 92 94 87  Resp: Temp: 98.5 F (36.9 C) 98 F (36.7 C) 98.6 F (37 C) 98 F (36.7 C)  TempSrc: Oral Oral Oral Oral  SpO2: 95% 93% 95% 96%  Weight:   78.8 kg   Height:  Weight change: 0.328 kg  Physical Examination:  General exam: alert awake, older than stated age HEENT:Oral mucosa moist, Ear/Nose WNL grossly Respiratory system: bilaterally clear BS, no use of accessory muscle Cardiovascular system: S1 & S2 +, No JVD. Gastrointestinal system: Abdomen soft,NT,ND, BS+ Nervous System:Alert, awake,  moving extremities. Extremities: LE edema neg,distal peripheral pulses palpable.  Skin: No rashes,no icterus. MSK: Normal muscle bulk,tone, power  Medications reviewed:  Scheduled Meds:  amLODipine  5 mg Oral Daily   [START ON 10/01/2022] aspirin  325 mg Oral Daily   carbidopa-levodopa  1 tablet Oral TID   folic acid  1 mg Oral Daily   hydroxychloroquine  200 mg Oral Daily   insulin aspart  0-9 Units Subcutaneous TID WC   mirabegron ER  25 mg Oral Daily   sodium chloride flush  3 mL Intravenous Q12H   Continuous Infusions:  sodium chloride     sodium chloride 1 mL/kg/hr (09/30/22 0531)   heparin 1,250 Units/hr (09/30/22 0539)    Diet Order             Diet NPO time specified Except for: Sips with Meds  Diet effective midnight                  Intake/Output Summary (Last 24 hours) at 09/30/2022 0850 Last data filed at 09/30/2022 0600 Gross per 24 hour  Intake 539.8 ml  Output 280 ml  Net 259.8 ml   Net IO Since Admission: 259.8 mL [09/30/22 0850]  Wt Readings from Last 3 Encounters:  09/30/22 78.8 kg  09/26/22 78.5 kg  04/10/22 82.1 kg     Unresulted Labs (From admission, onward)     Start     Ordered   09/30/22 1230  Heparin level (unfractionated)  Once-Timed,   TIMED       Question:  Specimen collection method  Answer:  Lab=Lab collect   09/30/22 0409   09/30/22 0500  Heparin level (unfractionated)  Daily,   R     See Hyperspace for full Linked Orders Report.   09/29/22 0952   09/30/22 0500  CBC  Daily,   R     See Hyperspace for full Linked Orders Report.   09/29/22 0952   09/30/22 0500  Comprehensive metabolic panel  Daily,   R      09/29/22 1045   09/30/22 0500  CBC  Daily,   R      09/29/22 1045   09/29/22 0714  Urinalysis, Routine w reflex microscopic -Urine, Clean Catch  (Undifferentiated presentation (screening labs and basic nursing orders))  ONCE - URGENT,   URGENT       Question:  Specimen Source  Answer:  Urine, Clean Catch   09/29/22 0714           Data Reviewed: I have personally reviewed following labs and imaging studies CBC: Recent Labs  Lab 09/26/22 0939 09/29/22 0654 09/30/22 0241  WBC 20.9* 9.5 15.5*  NEUTROABS 18,476* 8.1*  --   HGB 15.9 14.8 13.9  HCT 46.3 42.5 39.9  MCV 91.7 90.4 91.3  PLT 301 277 218   Basic Metabolic Panel: Recent Labs  Lab 09/26/22 0939 09/29/22 0654 09/30/22 0241  NA 135 134* 134*  K 3.9 3.4* 3.6  CL 101 101 97*  CO2 24 24 22   GLUCOSE 182* 185* 119*  BUN 20 21 12   CREATININE 0.91 0.90 0.80  CALCIUM 8.9 8.5* 8.7*   GFR: Estimated Creatinine Clearance: 83.6 mL/min (by C-G formula based  on SCr of 0.8 mg/dL). Liver Function Tests: Recent Labs  Lab 09/26/22 0939 09/29/22 0654 09/30/22 0241  AST 23 22 17   ALT 13 19 5   ALKPHOS  --  59 62  BILITOT 0.8 0.6 0.8  PROT 8.0 7.4 6.6  ALBUMIN  --  3.1* 2.7*   No results for input(s): "LIPASE", "AMYLASE" in the last 168 hours. No results for input(s): "AMMONIA" in the last 168 hours. Coagulation Profile: Recent Labs  Lab 09/29/22 0654  INR 1.0   BNP (last 3 results) No results for input(s): "PROBNP" in the last 8760 hours. HbA1C: Recent Labs    09/29/22 1926  HGBA1C 6.7*   CBG: Recent Labs  Lab 09/29/22 1223 09/29/22 1735 09/30/22 0841  GLUCAP 129* 107* 110*   Lipid Profile: No results for input(s): "CHOL", "HDL", "LDLCALC", "TRIG", "CHOLHDL", "LDLDIRECT" in the last 72 hours. Thyroid Function Tests: Recent Labs    09/29/22 1224  TSH 0.130*   Sepsis Labs: Recent Labs  Lab 09/29/22 0654 09/29/22 1926  LATICACIDVEN 1.9 1.5    Recent Results (from the past 240 hour(s))  Blood Culture (routine x 2)     Status: None (Preliminary result)   Collection Time: 09/29/22  6:55 AM   Specimen: BLOOD RIGHT FOREARM  Result Value Ref Range Status   Specimen Description BLOOD RIGHT FOREARM  Final   Special Requests   Final    BOTTLES DRAWN AEROBIC AND ANAEROBIC Blood Culture adequate volume   Culture   Final     NO GROWTH < 24 HOURS Performed at [facility], [address] [place], [place] [address]    Report Status PENDING  Incomplete  Blood Culture (routine x 2)     Status: None (Preliminary result)   Collection Time: 09/29/22  8:56 AM   Specimen: BLOOD  Result Value Ref Range Status   Specimen Description BLOOD SITE NOT SPECIFIED  Final   Special Requests   Final    BOTTLES DRAWN AEROBIC AND ANAEROBIC Blood Culture results may not be optimal due to an inadequate volume of blood received in culture bottles   Culture   Final    NO GROWTH < 24 HOURS Performed at [facility], [address] [place], [place] [address]    Report Status PENDING  Incomplete  Respiratory (~20 pathogens) panel by PCR     Status: None   Collection Time: 09/29/22  5:38 PM   Specimen: Nasopharyngeal Swab; Respiratory  Result Value Ref Range Status   Adenovirus NOT DETECTED NOT DETECTED Final   Coronavirus 229E NOT DETECTED NOT DETECTED Final    Comment: (NOTE) The Coronavirus on the Respiratory Panel, DOES NOT test for the novel  Coronavirus (2019 nCoV)    Coronavirus HKU1 NOT DETECTED NOT DETECTED Final   Coronavirus NL63 NOT DETECTED NOT DETECTED Final   Coronavirus OC43 NOT DETECTED NOT DETECTED Final   Metapneumovirus NOT DETECTED NOT DETECTED Final   Rhinovirus / Enterovirus NOT DETECTED NOT DETECTED Final   Influenza A NOT DETECTED NOT DETECTED Final   Influenza B NOT DETECTED NOT DETECTED Final   Parainfluenza Virus 1 NOT DETECTED NOT DETECTED Final   Parainfluenza Virus 2 NOT DETECTED NOT DETECTED Final   Parainfluenza Virus 3 NOT DETECTED NOT DETECTED Final   Parainfluenza Virus 4 NOT DETECTED NOT DETECTED Final   Respiratory Syncytial Virus NOT DETECTED NOT DETECTED Final   Bordetella pertussis NOT DETECTED NOT DETECTED Final   Bordetella Parapertussis NOT DETECTED NOT DETECTED Final  Chlamydophila pneumoniae NOT DETECTED NOT DETECTED Final   Mycoplasma pneumoniae NOT DETECTED NOT  DETECTED Final    Comment: Performed at [facility], [address] [place], [place] [address]    Antimicrobials: Anti-infectives (From admission, onward)    Start     Dose/Rate Route Frequency Ordered Stop   09/29/22 1430  hydroxychloroquine (PLAQUENIL) tablet 200 mg        200 mg Oral Daily 09/29/22 1421        Culture/Microbiology    Component Value Date/Time   SDES BLOOD SITE NOT SPECIFIED 09/29/2022 0856   SPECREQUEST  09/29/2022 0856    BOTTLES DRAWN AEROBIC AND ANAEROBIC Blood Culture results may not be optimal due to an inadequate volume of blood received in culture bottles   CULT  09/29/2022 0856    NO GROWTH < 24 HOURS Performed at [facility], [address] [place], [place] [address]    REPTSTATUS PENDING 09/29/2022 0981  Radiology Studies: CT Angio Chest PE W and/or Wo Contrast  Result Date: 09/29/2022 CLINICAL DATA:  Pulmonary embolism (PE) suspected, low to intermediate prob, positive D-dimer EXAM: CT ANGIOGRAPHY CHEST WITH CONTRAST TECHNIQUE: Multidetector CT imaging of the chest was performed using the standard protocol during bolus administration of intravenous contrast. Multiplanar CT image reconstructions and MIPs were obtained to evaluate the vascular anatomy. RADIATION DOSE REDUCTION: This exam was performed according to the departmental dose-optimization program which includes automated exposure control, adjustment of the mA and/or kV according to patient size and/or use of iterative reconstruction technique. CONTRAST:  75mL OMNIPAQUE IOHEXOL 350 MG/ML SOLN COMPARISON:  11/26/2021 FINDINGS: Cardiovascular: Satisfactory opacification of the pulmonary arteries to the segmental level. No evidence of pulmonary embolism. Thoracic aorta is nonaneurysmal. Atherosclerotic vascular calcifications of the aorta and coronary arteries. Normal heart size. No pericardial effusion. Mediastinum/Nodes: No enlarged mediastinal, hilar, or axillary lymph nodes. Thyroid  gland and trachea demonstrate no significant findings. Distal esophagus is mildly thickened. Lungs/Pleura: Passive atelectasis within the dependent lung fields. No focal consolidation. No pleural effusion or pneumothorax. Upper Abdomen: No acute abnormality. Musculoskeletal: No chest wall abnormality. No acute or significant osseous findings. Prior cement augmentation of the T5 vertebral body. Review of the MIP images confirms the above findings. IMPRESSION: 1. No evidence of pulmonary embolism or other acute intrathoracic findings. 2. Mild thickening of the distal esophagus, which can be seen in the setting of esophagitis. 3. Aortic and coronary artery atherosclerosis (ICD10-I70.0). Electronically Signed   By: Duanne Guess D.O.   On: 09/29/2022 09:15   DG Chest Port 1 View  Result Date: 09/29/2022 CLINICAL DATA:  Sepsis. EXAM: PORTABLE CHEST 1 VIEW COMPARISON:  11/24/2021 FINDINGS: The heart size and mediastinal contours are within normal limits. Both lungs are clear. Previous midthoracic spine vertebroplasty noted. IMPRESSION: No active disease. Electronically Signed   By: Danae Orleans M.D.   On: 09/29/2022 08:03     LOS: 0 days   Lanae Boast, MD [facility]  09/30/2022, 8:50 AM

## 2022-09-30 NOTE — Progress Notes (Signed)
ANTICOAGULATION CONSULT NOTE   Pharmacy Consult for heparin Indication: chest pain/ACS  Allergies  Allergen Reactions   Codeine Swelling    Other reaction(s): rash   Penicillins Hives and Swelling    Has patient had a PCN reaction causing immediate rash, facial/tongue/throat swelling, SOB or lightheadedness with hypotension: Yes Has patient had a PCN reaction causing severe rash involving mucus membranes or skin necrosis: No Has patient had a PCN reaction that required hospitalization No Has patient had a PCN reaction occurring within the last 10 years: No If all of the above answers are "NO", then may proceed with Cephalosporin use.    Prednisone Hives    Other reaction(s): hives   Wool Alcohol [Lanolin] Hives   Empagliflozin Nausea And Vomiting    Other reaction(s): nausea and vomiting   Penicillin G     Other reaction(s): rash   Statins     Other reaction(s): myopathy    Patient Measurements: Height:  (177.8 cm) Weight: 78.5 kg (173 lb) IBW/kg (Calculated) : 73 Heparin Dosing Weight: TBW  Vital Signs: Temp: 98 F (36.7 C) (04/22 0045) Temp Source: Oral (04/22 0045) BP: 139/84 (04/22 0045) Pulse Rate: 92 (04/22 0045)  Labs: Recent Labs    09/29/22 0654 09/29/22 0914 09/29/22 1800 09/29/22 2150 09/30/22 0241  HGB 14.8  --   --   --  13.9  HCT 42.5  --   --   --  39.9  PLT 277  --   --   --  218  APTT 26  --   --  66*  --   LABPROT 13.3  --   --   --   --   INR 1.0  --   --   --   --   HEPARINUNFRC  --   --  <0.10*  --  0.20*  CREATININE 0.90  --   --   --  0.80  TROPONINIHS 534* 357*  --   --   --      Estimated Creatinine Clearance: 83.6 mL/min (by C-G formula based on SCr of 0.8 mg/dL).   Medical History: Past Medical History:  Diagnosis Date   Anginal pain    Arthritis    "fingers" (03/09/2012)   Cold feet    "from my diabetes; they stay cold" (03/09/2012)   Coronary artery disease    Coronary artery spasm    since age 49 Dr Aleen Campi    COVID 07/06/2019   hospitalizied   Dyslipidemia    Femoral DVT (deep venous thrombosis)    left   Headache(784.0)    Hx of gallstones    Dr Derrell Lolling lap cholecystectomy   Hx of plastic surgery    plastic surgery following a fall off a loading dock ,lost  all his upper teeth    Hypertension    MI, old    at age 77   Migraines    Pneumonia ~ 1962; 1970's   "double"; "regular" (03/09/2012)   Rheumatoid arthritis    Dr Dierdre Forth    Slipped cervical disc 06/10/2006   Dr Patsi Sears in the past/ Dr Larita Fife , chiropractor in 2008   Thyroid disease    Dr Lucianne Muss   Type II diabetes mellitus     Assessment: 77 YOM presenting with CP, elevated troponin, CBC wnl, has been on Eliquis for hx of PE however has run out of this and states he has not taken in at least 3 weeks.  Heparin level is below goal this  AM at 0.2.  No known issues with IV infusion.  Goal of Therapy:  Heparin level 0.3-0.7 units/ml Monitor platelets by anticoagulation protocol: Yes   Plan:  Increase IV heparin gtt to 1250 units/hr Repeat heparin level in 8 hrs. Daily heparin level and CBC.  Reece Leader, Colon Flattery, BCCP Clinical Pharmacist  09/30/2022 4:10 AM   [facility] pharmacy phone numbers are listed on [email or web address]

## 2022-09-30 NOTE — Consult Note (Addendum)
[address]       [place] [address]             [phone]        ISSAAC SHIPPER [facility] Medical Record #[id number] Date of Birth: Sep 23, 1945  Referring: No ref. provider found Primary Care: Irena Reichmann, DO Primary Cardiologist:Mark Anne Fu, MD  Chief Complaint:    Chief Complaint  Patient presents with   Chest Pain    History of Present Illness: We are asked to see this 77 year old male in cardiothoracic surgical consultation for consideration of coronary artery surgical revascularization.  The patient has a history of cardiac disease dating back to 20 at which time he had a myocardial infarction which was felt to be secondary to vasospasm as catheterization at that time was unremarkable.  He does have multiple cardiac risk factors including hypertension, hyperlipidemia with intolerance to statins, history of aortic atherosclerosis and type 2 diabetes mellitus.  He was last seen by cardiology in July of this year which time he was staying in a skilled nursing facility because of weakness as well as a left lower extremity DVT/PE.  He was not having any anginal symptoms at that time and was felt to be doing generally well from a cardiac standpoint.  On the date of admission he was awoken with a 5-7/10 chest pain which was associated with some shaking.  He does have occasional chest pain but this shaking was new.  Typical chest pain for him is midsternal with some radiation to the left shoulder.   This event was associated with some shortness of breath as well as mild nausea.  The chest pain itself lasted for about 2 hours and he presented to the emergency room at which time it had been completely resolved.  A chest CTA showed no evidence of PE or acute intrathoracic finding except possible esophagitis.  There was some evidence of aortic and coronary artery atherosclerosis.  Initial troponins were elevated and he was started on heparin.  He was admitted for further  diagnostic evaluation and treatment.  High-sensitivity troponin peaked at 534.  An echocardiogram has been performed but results are currently pending.  Cardiac catheterization has revealed severe three-vessel coronary artery disease.  Of interest, the patient is noted to have a history of couple recent falls.    Current Activity/ Functional Status: Patient was independent with mobility/ambulation, transfers, ADL's, IADL's.   Zubrod Score: At the time of surgery this patient's most appropriate activity status/level should be described as:     0    Normal activity, no symptoms     1    Restricted in physical strenuous activity but ambulatory, able to do out light work     2    Ambulatory and capable of self care, unable to do work activities, up and about                 more than 50%  Of the time                                3    Only limited self care, in bed greater than 50% of waking hours     4    Completely disabled, no self care, confined to bed or chair     5    Moribund  Past Medical History:  Diagnosis Date   Anginal pain  Arthritis    "fingers" (03/09/2012)   Cold feet    "from my diabetes; they stay cold" (03/09/2012)   Coronary artery disease    Coronary artery spasm    since age 65 Dr Aleen Campi   COVID 07/06/2019   hospitalizied   Dyslipidemia    Femoral DVT (deep venous thrombosis)    left   Headache(784.0)    Hx of gallstones    Dr Derrell Lolling lap cholecystectomy   Hx of plastic surgery    plastic surgery following a fall off a loading dock ,lost  all his upper teeth    Hypertension    MI, old    at age 67   Migraines    Pneumonia ~ 1962; 1970's   "double"; "regular" (03/09/2012)   Rheumatoid arthritis    Dr Dierdre Forth    Slipped cervical disc 06/10/2006   Dr Patsi Sears in the past/ Dr Larita Fife , chiropractor in 2008   Thyroid disease    Dr Lucianne Muss   Type II diabetes mellitus     Past Surgical History:  Procedure Laterality Date   CARDIAC  CATHETERIZATION     x 3 with NO stents    CHOLECYSTECTOMY  03/11/2012   Procedure: LAPAROSCOPIC CHOLECYSTECTOMY WITH INTRAOPERATIVE CHOLANGIOGRAM;  Surgeon: Ernestene Mention, MD;  Location: [facility] OR;  Service: General;  Laterality: N/A;   KYPHOPLASTY N/A 05/30/2021   Procedure: THORACIC FIVE KYPHOPLASTY;  Surgeon: Coletta Memos, MD;  Location: [facility] OR;  Service: Neurosurgery;  Laterality: N/A;  THORACIC FIVE KYPHOPLASTY   LEFT HEART CATH AND CORONARY ANGIOGRAPHY N/A 09/30/2022   Procedure: LEFT HEART CATH AND CORONARY ANGIOGRAPHY;  Surgeon: Corky Crafts, MD;  Location: [facility] INVASIVE CV LAB;  Service: Cardiovascular;  Laterality: N/A;   PENILE PROSTHESIS IMPLANT  1983    Social History   Tobacco Use  Smoking Status Never   Passive exposure: Never  Smokeless Tobacco Never    Social History   Substance and Sexual Activity  Alcohol Use No     Allergies  Allergen Reactions   Codeine Swelling    Other reaction(s): rash   Penicillins Hives and Swelling    Has patient had a PCN reaction causing immediate rash, facial/tongue/throat swelling, SOB or lightheadedness with hypotension: Yes Has patient had a PCN reaction causing severe rash involving mucus membranes or skin necrosis: No Has patient had a PCN reaction that required hospitalization No Has patient had a PCN reaction occurring within the last 10 years: No If all of the above answers are "NO", then may proceed with Cephalosporin use.    Prednisone Hives    Other reaction(s): hives   Wool Alcohol [Lanolin] Hives   Empagliflozin Nausea And Vomiting    Other reaction(s): nausea and vomiting   Penicillin G     Other reaction(s): rash   Statins     Other reaction(s): myopathy    Current Facility-Administered Medications  Medication Dose Route Frequency Provider Last Rate Last Admin   0.9 %  sodium chloride infusion   Intravenous Continuous Corky Crafts, MD 100 mL/hr at 09/30/22 1034 Rate Change at 09/30/22 1034   0.9  %  sodium chloride infusion  250 mL Intravenous PRN Corky Crafts, MD       acetaminophen (TYLENOL) tablet 650 mg  650 mg Oral Q4H PRN Corky Crafts, MD       amLODipine (NORVASC) tablet 5 mg  5 mg Oral Daily Corky Crafts, MD   5 mg at 09/30/22 1610   [  START ON 10/01/2022] aspirin chewable tablet 81 mg  81 mg Oral Daily Corky Crafts, MD       carbidopa-levodopa (SINEMET IR) 25-100 MG per tablet immediate release 1 tablet  1 tablet Oral TID Corky Crafts, MD   1 tablet at 09/30/22 0844   folic acid (FOLVITE) tablet 1 mg  1 mg Oral Daily Corky Crafts, MD   1 mg at 09/30/22 0844   hydrALAZINE (APRESOLINE) injection 10 mg  10 mg Intravenous Q20 Min PRN Corky Crafts, MD       hydroxychloroquine (PLAQUENIL) tablet 200 mg  200 mg Oral Daily Corky Crafts, MD   200 mg at 09/30/22 0844   insulin aspart (novoLOG) injection 0-9 Units  0-9 Units Subcutaneous TID WC Corky Crafts, MD       labetalol (NORMODYNE) injection 10 mg  10 mg Intravenous Q10 min PRN Corky Crafts, MD       meclizine (ANTIVERT) tablet 25 mg  25 mg Oral TID PRN Corky Crafts, MD       mirabegron ER [facility]) tablet 25 mg  25 mg Oral Daily Corky Crafts, MD   25 mg at 09/30/22 0844   nitroGLYCERIN (NITROSTAT) SL tablet 0.4 mg  0.4 mg Sublingual Q5 Min x 3 PRN Corky Crafts, MD       ondansetron [facility]) injection 4 mg  4 mg Intravenous Q6H PRN Corky Crafts, MD       sodium chloride flush (NS) 0.9 % injection 3 mL  3 mL Intravenous Q12H Corky Crafts, MD       sodium chloride flush (NS) 0.9 % injection 3 mL  3 mL Intravenous Q12H Corky Crafts, MD       sodium chloride flush (NS) 0.9 % injection 3 mL  3 mL Intravenous PRN Corky Crafts, MD        Facility-Administered Medications Prior to Admission  Medication Dose Route Frequency Provider Last Rate Last Admin   Study - ORION 4 - inclisiran 300 mg/1.52mL or placebo  SQ injection (PI-Stuckey)  300 mg Subcutaneous Q6 months Herby Abraham, MD       Medications Prior to Admission  Medication Sig Dispense Refill Last Dose   apixaban (ELIQUIS) 5 MG TABS tablet Take 10 mg p.o. twice daily, on  Wed 12/05/21 at 1000-switch to 5 mg p.o. twice daily. 60 tablet  Past Month   carbidopa-levodopa (SINEMET IR) 25-100 MG tablet Take 1 tablet by mouth 3 (three) times daily. 90 tablet 6 09/28/2022   folic acid (FOLVITE) 1 MG tablet Take 1 tablet (1 mg total) by mouth daily. 90 tablet 3 09/28/2022   GVOKE HYPOPEN 2-PACK 1 MG/0.2ML SOAJ Inject into the skin.      HUMIRA, 2 PEN, 40 MG/0.4ML PNKT Inject 0.4 mLs into the muscle every 14 (fourteen) days. 2 each 2 Past Month   hydroxychloroquine (PLAQUENIL) 200 MG tablet Take 1 tablet (200 mg total) by mouth daily. 90 tablet 0 09/28/2022   insulin aspart (NOVOLOG) 100 UNIT/ML injection 0-5 Units, Subcutaneous, Daily at bedtime: HS scale CBG < 70: implement hypoglycemia measures CBG 70 - 120: 0 units CBG 121 - 150: 0 units CBG 151 - 200: 0 units CBG 201 - 250: 2 units CBG 251 - 300: 3 units CBG 301 - 350: 4 units CBG 351 - 400: 5 units CBG > 400: call MD and obtain STAT lab verification 10 mL 11 09/28/2022   insulin aspart (  NOVOLOG) 100 UNIT/ML injection 0-15 Units, Subcutaneous, 3 times daily with meals CBG < 70: Implement Hypoglycemia Standing Orders and refer to Hypoglycemia Standing Orders sidebar report CBG 70 - 120: 0 units CBG 121 - 150: 2 units CBG 151 - 200: 3 units CBG 201 - 250: 5 units CBG 251 - 300: 8 units CBG 301 - 350: 11 units CBG 351 - 400: 15 units CBG > 400: call MD and obtain STAT lab verification 10 mL 11 09/28/2022   meclizine (ANTIVERT) 25 MG tablet Take 25 mg by mouth 3 (three) times daily as needed for dizziness.      methotrexate (RHEUMATREX) 2.5 MG tablet Take 6 tablets (15 mg total) by mouth once a week. Caution:Chemotherapy. Protect from light. 72 tablet 0 Past Week   MYRBETRIQ 25 MG TB24 tablet Take 25 mg by  mouth daily.   09/28/2022   BD PEN NEEDLE MICRO U/F 32G X 6 MM MISC Inject into the skin 3 (three) times daily.      BD PEN NEEDLE NANO 2ND GEN 32G X 4 MM MISC       Continuous Glucose Sensor (FREESTYLE LIBRE 2 SENSOR) MISC Place onto the skin.      diltiazem (TIAZAC) 300 MG 24 hr capsule Take 1 capsule (300 mg total) by mouth daily. (Patient not taking: Reported on 09/26/2022) 90 capsule 3    Study - ORION 4 - inclisiran 300 mg/1.44mL or placebo SQ injection (PI-Stuckey) Inject 1.5 mLs (300 mg total) into the skin every 6 (six) months. (Patient not taking: Reported on 09/26/2022) 1 mL 1     Family History  Problem Relation Age of Onset   Heart failure Mother    Cancer - Other Mother        breast   Heart disease Father    Cancer - Other Sister        tongue cancer   Heart attack Sister      Review of Systems:   Review of Systems  Constitutional:  Positive for malaise/fatigue.  HENT: Negative.    Eyes:        Previous right cataract surgery  Respiratory:  Positive for shortness of breath.   Cardiovascular:  Positive for chest pain and palpitations.  Gastrointestinal: Negative.   Genitourinary: Negative.   Musculoskeletal:  Positive for falls.       He has fallen 3 times in the past 6 months.  He did answer unpredictable but are associated with dizziness  Skin: Negative.   Neurological:  Positive for dizziness and headaches.  Endo/Heme/Allergies:  Bruises/bleeds easily.  Psychiatric/Behavioral: Negative.            Physical Exam: BP 106/65 (BP Location: Left Arm)   Pulse 80   Temp 98.1 F (36.7 C) (Oral)   Resp 18   Ht  (1.778 m)   Wt 78.8 kg   SpO2 97%   BMI 24.93 kg/m    General appearance: alert, cooperative, distracted, and no distress Head: Normocephalic, without obvious abnormality, atraumatic Neck: no adenopathy, no carotid bruit, no JVD, supple, symmetrical, trachea midline, and thyroid not enlarged, symmetric, no tenderness/mass/nodules Lymph nodes:  Cervical, supraclavicular, and axillary nodes normal. Resp: clear to auscultation bilaterally Back: symmetric, no curvature. ROM normal. No CVA tenderness. Cardio: regular rate and rhythm, S1, S2 normal, no murmur, click, rub or gallop GI: Soft, nonstented, he does have some left upper quadrant tenderness to palpation with out definite mass.  Positive bowel sounds, no organomegaly Extremities: Pedal pulses  are absent on the right and weak on the left.  Legs are very thin with superficial veins. Neurologic: Grossly normal Mouth: Edentulous on the upper, fair dentition lower  Diagnostic Studies & Laboratory data:     Recent Radiology Findings:   CARDIAC CATHETERIZATION  Result Date: 09/30/2022   Ramus-2 lesion is 75% stenosed.   Ramus-1 lesion is 80% stenosed.   Mid LAD lesion is 75% stenosed.   Ost LAD to Prox LAD lesion is 50% stenosed.   Ost Cx to Prox Cx lesion is 60% stenosed.   Mid LM to Ost LAD lesion is 40% stenosed.   Ost RCA to Prox RCA lesion is 50% stenosed.   RPDA lesion is 75% stenosed.   LV end diastolic pressure is normal.   There is no aortic valve stenosis.   Severe right subclavian tortuosity.  85 cm destination sheath not available today.  Right groin approach used. Moderate disease in the distal left main, ostial LAD and ostial circumflex.  Severe disease in the large ramus and mid LAD.  Will obtain surgical consultation.  I discussed the findings with the wife and specifically asked about the DNR status.  It was meant to be more related to 'he would not want to live on a vent if he had brain damage.' If he was not a candidate for CABG due to other comorbidities, could consider PCI of the mid LAD and medical therapy of the ramus vessel and other moderate disease.   CT Angio Chest PE W and/or Wo Contrast  Result Date: 09/29/2022 CLINICAL DATA:  Pulmonary embolism (PE) suspected, low to intermediate prob, positive D-dimer EXAM: CT ANGIOGRAPHY CHEST WITH CONTRAST TECHNIQUE:  Multidetector CT imaging of the chest was performed using the standard protocol during bolus administration of intravenous contrast. Multiplanar CT image reconstructions and MIPs were obtained to evaluate the vascular anatomy. RADIATION DOSE REDUCTION: This exam was performed according to the departmental dose-optimization program which includes automated exposure control, adjustment of the mA and/or kV according to patient size and/or use of iterative reconstruction technique. CONTRAST:  75mL OMNIPAQUE IOHEXOL 350 MG/ML SOLN COMPARISON:  11/26/2021 FINDINGS: Cardiovascular: Satisfactory opacification of the pulmonary arteries to the segmental level. No evidence of pulmonary embolism. Thoracic aorta is nonaneurysmal. Atherosclerotic vascular calcifications of the aorta and coronary arteries. Normal heart size. No pericardial effusion. Mediastinum/Nodes: No enlarged mediastinal, hilar, or axillary lymph nodes. Thyroid gland and trachea demonstrate no significant findings. Distal esophagus is mildly thickened. Lungs/Pleura: Passive atelectasis within the dependent lung fields. No focal consolidation. No pleural effusion or pneumothorax. Upper Abdomen: No acute abnormality. Musculoskeletal: No chest wall abnormality. No acute or significant osseous findings. Prior cement augmentation of the T5 vertebral body. Review of the MIP images confirms the above findings. IMPRESSION: 1. No evidence of pulmonary embolism or other acute intrathoracic findings. 2. Mild thickening of the distal esophagus, which can be seen in the setting of esophagitis. 3. Aortic and coronary artery atherosclerosis (ICD10-I70.0). Electronically Signed   By: Duanne Guess D.O.   On: 09/29/2022 09:15   DG Chest Port 1 View  Result Date: 09/29/2022 CLINICAL DATA:  Sepsis. EXAM: PORTABLE CHEST 1 VIEW COMPARISON:  11/24/2021 FINDINGS: The heart size and mediastinal contours are within normal limits. Both lungs are clear. Previous midthoracic spine  vertebroplasty noted. IMPRESSION: No active disease. Electronically Signed   By: Danae Orleans M.D.   On: 09/29/2022 08:03     I have independently reviewed the above radiologic studies and discussed with the patient  Recent Lab Findings: Lab Results  Component Value Date   WBC 15.5 (H) 09/30/2022   HGB 13.9 09/30/2022   HCT 39.9 09/30/2022   PLT 218 09/30/2022   GLUCOSE 119 (H) 09/30/2022   CHOL 248 (H) 08/08/2020   TRIG 426 (H) 08/08/2020   HDL 31 (L) 08/08/2020   LDLCALC 139 (H) 08/08/2020   ALT 5 09/30/2022   AST 17 09/30/2022   NA 134 (L) 09/30/2022   K 3.6 09/30/2022   CL 97 (L) 09/30/2022   CREATININE 0.80 09/30/2022   BUN 12 09/30/2022   CO2 22 09/30/2022   TSH 0.130 (L) 09/29/2022   INR 1.0 09/29/2022   HGBA1C 6.7 (H) 09/29/2022      Assessment / Plan: Severe three-vessel coronary artery disease in the setting of non-STEMI.  History of previous MI in 1981 related to "spasm" Possible esophagitis per CT scan. Hyperlipidemia, history of intolerance to statins Hypertension History of DVT/PE-left sided DVTs, multiple Rheumatoid arthritis on Jak inhibitor and methotrexate History of migraine headaches   Plan: The patient and studies will be reviewed by the surgeon for consideration of CABG.  I  spent 30 minutes counseling the patient face to face.   Rowe Clack, PA-C  09/30/2022 11:46 AM  Patient seen and examined, history and physical performed. Cath images reviewed.  77 yo man who presented with a non-STEMI and has severe 3 vessel CAD. He has surgical anatomy but unfortunately has co-morbid issues which severely complicate mattters.  He is unable to support himself on legs without assistance.  Uses a walker but can only go about 50'. This is a long standing issue and unlikely to improve significantly in the short term.  It will impede his ability to recover from CABG and CABG would likely be a major setback for him.  Although limited revascularization, best  option is probably PCI and medical therapy.  Will d/w Cardiology  Viviann Spare C. Dorris Fetch, MD [facility] [phone]

## 2022-09-30 NOTE — Progress Notes (Signed)
PROGRESS NOTE JAKEB LAMPING  ZOX:[id number] DOB: Feb 18, 1946 DOA: 09/29/2022 PCP: Irena Reichmann, DO  Brief Narrative/Hospital Course: 77 year old male with history of DVT/PE in 2021, has not had Eliquis since 2 weeks-prescription expired, history of CAD/coronary artery spasm had cardiac cath >>> vasospasm, hypertension, migraine diabetes mellitus,rheumatoid arthritis, slipped vertebral cervical disc presenting with chest pain over the last few weeks. He woke up from sleep earlier this a.m. and episode of chest pain, his usual angina with associated sweating lasting for an hour and resolved.  Pain lasted for 2 hours today and came to ED, In ED: temp 100.9, heart rate 113 BP in 140s to 160s systolic, saturating on room air Labs showed mild hyponatremia, hypokalemia with a stable renal function, troponin elevated at 534 357>, lactic acid 1.9, normal CBC, D-dimer 0.97.Reviewed chest x-ray clear.  CT angio chest no evidence of PE or acute intrathoracic finding, possible esophagitis, aortic and coronary artery atherosclerosis noted Cardiology was consulted, aspirin 81 placed on heparin drip and admission was requested for further management. Followed closely cardiology with plan for left heart cath  Subjective: Seen and examined this morning no chest pain resting comfortably On the way to cardiac cath No acute events overnight  Assessment and Plan: Principal Problem:   Non-ST elevation (NSTEMI) myocardial infarction Active Problems:   Coronary artery disease   Hypertension   Dyslipidemia   Coronary vasospasm ([name]   History of pulmonary embolism   Chest pain   Elevated troponin   NSTEMI CAD-coronary artery atherosclerosis-coronary spasm history: Elevated Troponin: concerning for nstemi, On admission placed on heparin drip seen by cardiology, this morning underwent cardiac cath > that showed multiple CAD lesions moderate disease in the distal left main ostial LAD and ostial circumflex severe  disease in the large ramus and mid LAD, surgical consultation requested for CABG. continue on aspirin 81, check lipid profile, follow-up echocardiogram and further cardiology plan  Dyslipidemia: Check fasting lipid profile   Hypertension: BP well-controlled continue amlodipine    Dyslipidemia: Check fasting lipid profile   Esophagitis per CT scan: Continue PPI   Low-grade fever: Unclear etiology respiratory virus panel unremarkable chest x-ray and CT imaging no acute lung findings.  It has resolved.  He does not have respiratory symptoms. Monitor.     Hypokalemia repleted   History of pulmonary embolism/VTE/ positive D-dimer:Now on heparin drip, d dimer slightly up> currently on heparin drip precath and held   T2DM: A1c 6.7, blood sugar well-controlled continue SSI.   Recent Labs  Lab 09/29/22 1223 09/29/22 1735 09/29/22 1926 09/30/22 0841  GLUCAP 129* 107*  --  110*  HGBA1C  --   --  6.7*  --       RA: On Humira and Plaquenil followed by rheumatology as outpatient  Code status:DNR.  DVT prophylaxis: heparin gtt Code Status:   Code Status: DNR Family Communication: plan of care discussed with patient at bedside. Patient status is:  inpatient  because of further cardiac workup Level of care: Progressive   Dispo: The patient is from: home            Anticipated disposition: tbd Objective: Vitals last 24 hrs: Vitals:   09/30/22 0802 09/30/22 0917 09/30/22 1029 09/30/22 1234  BP: 131/85  106/65 121/74  Pulse: 87  80 69  Resp: 17  18 18   Temp: 98 F (36.7 C)  98.1 F (36.7 C) 98.2 F (36.8 C)  TempSrc: Oral  Oral Oral  SpO2: 96% 99% 97% 96%  Weight:  Height:       Weight change: 0.328 kg  Physical Examination:  General exam: alert awake, older than stated age HEENT:Oral mucosa moist, Ear/Nose WNL grossly Respiratory system: bilaterally clear BS, no use of accessory muscle Cardiovascular system: S1 & S2 +, No JVD. Gastrointestinal system: Abdomen soft,NT,ND,  BS+ Nervous System:Alert, awake, moving extremities. Extremities: LE edema neg,distal peripheral pulses palpable.  Skin: No rashes,no icterus. MSK: Normal muscle bulk,tone, power  Medications reviewed:  Scheduled Meds:  amLODipine  5 mg Oral Daily   [START ON 10/01/2022] aspirin  81 mg Oral Daily   carbidopa-levodopa  1 tablet Oral TID   folic acid  1 mg Oral Daily   hydroxychloroquine  200 mg Oral Daily   insulin aspart  0-9 Units Subcutaneous TID WC   mirabegron ER  25 mg Oral Daily   sodium chloride flush  3 mL Intravenous Q12H   sodium chloride flush  3 mL Intravenous Q12H   Continuous Infusions:  sodium chloride 100 mL/hr at 09/30/22 1034   sodium chloride      Diet Order             Diet Carb Modified Fluid consistency: Thin; Room service appropriate? Yes  Diet effective now                  Intake/Output Summary (Last 24 hours) at 09/30/2022 1238 Last data filed at 09/30/2022 0600 Gross per 24 hour  Intake 539.8 ml  Output 280 ml  Net 259.8 ml    Net IO Since Admission: 259.8 mL [09/30/22 1238]  Wt Readings from Last 3 Encounters:  09/30/22 78.8 kg  09/26/22 78.5 kg  04/10/22 82.1 kg     Unresulted Labs (From admission, onward)     Start     Ordered   10/01/22 0500  Basic metabolic panel  Tomorrow morning,   R       Question:  Specimen collection method  Answer:  Lab=Lab collect   09/30/22 1031   10/01/22 0500  CBC  Tomorrow morning,   R       Question:  Specimen collection method  Answer:  Lab=Lab collect   09/30/22 1031   10/01/22 0500  Lipoprotein A (LPA)  Tomorrow morning,   R       Question:  Specimen collection method  Answer:  Lab=Lab collect   09/30/22 1031   10/01/22 0500  Basic metabolic panel  Daily,   R     Question:  Specimen collection method  Answer:  Lab=Lab collect   09/30/22 1145   10/01/22 0500  CBC  Daily,   R     Question:  Specimen collection method  Answer:  Lab=Lab collect   09/30/22 1145   09/30/22 0500  Comprehensive  metabolic panel  Daily,   R      09/29/22 1045   09/30/22 0500  CBC  Daily,   R      09/29/22 1045   09/29/22 0714  Urinalysis, Routine w reflex microscopic -Urine, Clean Catch  (Undifferentiated presentation (screening labs and basic nursing orders))  ONCE - URGENT,   URGENT       Question:  Specimen Source  Answer:  Urine, Clean Catch   09/29/22 0714          Data Reviewed: I have personally reviewed following labs and imaging studies CBC: Recent Labs  Lab 09/26/22 0939 09/29/22 0654 09/30/22 0241  WBC 20.9* 9.5 15.5*  NEUTROABS 18,476* 8.1*  --  HGB 15.9 14.8 13.9  HCT 46.3 42.5 39.9  MCV 91.7 90.4 91.3  PLT 301 277 218    Basic Metabolic Panel: Recent Labs  Lab 09/26/22 0939 09/29/22 0654 09/30/22 0241  NA 135 134* 134*  K 3.9 3.4* 3.6  CL 101 101 97*  CO2 GLUCOSE 182* 185* 119*  BUN CREATININE 0.91 0.90 0.80  CALCIUM 8.9 8.5* 8.7*    GFR: Estimated Creatinine Clearance: 83.6 mL/min (by C-G formula based on SCr of 0.8 mg/dL). Liver Function Tests: Recent Labs  Lab 09/26/22 0939 09/29/22 0654 09/30/22 0241  AST ALT ALKPHOS  --  59 62  BILITOT 0.8 0.6 0.8  PROT 8.0 7.4 6.6  ALBUMIN  --  3.1* 2.7*    No results for input(s): "LIPASE", "AMYLASE" in the last 168 hours. No results for input(s): "AMMONIA" in the last 168 hours. Coagulation Profile: Recent Labs  Lab 09/29/22 0654  INR 1.0    BNP (last 3 results) No results for input(s): "PROBNP" in the last 8760 hours. HbA1C: Recent Labs    09/29/22 1926  HGBA1C 6.7*    CBG: Recent Labs  Lab 09/29/22 1223 09/29/22 1735 09/30/22 0841  GLUCAP 129* 107* 110*    Lipid Profile: No results for input(s): "CHOL", "HDL", "LDLCALC", "TRIG", "CHOLHDL", "LDLDIRECT" in the last 72 hours. Thyroid Function Tests: Recent Labs    09/29/22 1224  TSH 0.130*    Sepsis Labs: Recent Labs  Lab 09/29/22 0654 09/29/22 1926  LATICACIDVEN 1.9 1.5      Recent Results (from the past 240 hour(s))  Blood Culture (routine x 2)     Status: None (Preliminary result)   Collection Time: 09/29/22  6:55 AM   Specimen: BLOOD RIGHT FOREARM  Result Value Ref Range Status   Specimen Description BLOOD RIGHT FOREARM  Final   Special Requests   Final    BOTTLES DRAWN AEROBIC AND ANAEROBIC Blood Culture adequate volume   Culture   Final    NO GROWTH < 24 HOURS Performed at [facility], [address], [place], [place] [address]    Report Status PENDING  Incomplete  Blood Culture (routine x 2)     Status: None (Preliminary result)   Collection Time: 09/29/22  8:56 AM   Specimen: BLOOD  Result Value Ref Range Status   Specimen Description BLOOD SITE NOT SPECIFIED  Final   Special Requests   Final    BOTTLES DRAWN AEROBIC AND ANAEROBIC Blood Culture results may not be optimal due to an inadequate volume of blood received in culture bottles   Culture   Final    NO GROWTH < 24 HOURS Performed at [facility], [address], [place], [place] [address]    Report Status PENDING  Incomplete  Respiratory (~20 pathogens) panel by PCR     Status: None   Collection Time: 09/29/22  5:38 PM   Specimen: Nasopharyngeal Swab; Respiratory  Result Value Ref Range Status   Adenovirus NOT DETECTED NOT DETECTED Final   Coronavirus 229E NOT DETECTED NOT DETECTED Final    Comment: (NOTE) The Coronavirus on the Respiratory Panel, DOES NOT test for the novel  Coronavirus (2019 nCoV)    Coronavirus HKU1 NOT DETECTED NOT DETECTED Final   Coronavirus NL63 NOT DETECTED NOT DETECTED Final   Coronavirus OC43 NOT DETECTED NOT DETECTED Final   Metapneumovirus NOT DETECTED NOT DETECTED Final  Rhinovirus / Enterovirus NOT DETECTED NOT DETECTED Final   Influenza A NOT DETECTED NOT DETECTED Final   Influenza B NOT DETECTED NOT DETECTED Final   Parainfluenza Virus 1 NOT DETECTED NOT DETECTED Final   Parainfluenza Virus 2 NOT DETECTED NOT DETECTED Final    Parainfluenza Virus 3 NOT DETECTED NOT DETECTED Final   Parainfluenza Virus 4 NOT DETECTED NOT DETECTED Final   Respiratory Syncytial Virus NOT DETECTED NOT DETECTED Final   Bordetella pertussis NOT DETECTED NOT DETECTED Final   Bordetella Parapertussis NOT DETECTED NOT DETECTED Final   Chlamydophila pneumoniae NOT DETECTED NOT DETECTED Final   Mycoplasma pneumoniae NOT DETECTED NOT DETECTED Final    Comment: Performed at [facility], [address] [place], [place] [address]    Antimicrobials: Anti-infectives (From admission, onward)    Start     Dose/Rate Route Frequency Ordered Stop   09/29/22 1430  hydroxychloroquine (PLAQUENIL) tablet 200 mg        200 mg Oral Daily 09/29/22 1421        Culture/Microbiology    Component Value Date/Time   SDES BLOOD SITE NOT SPECIFIED 09/29/2022 0856   SPECREQUEST  09/29/2022 0856    BOTTLES DRAWN AEROBIC AND ANAEROBIC Blood Culture results may not be optimal due to an inadequate volume of blood received in culture bottles   CULT  09/29/2022 0856    NO GROWTH < 24 HOURS Performed at [facility], [address] [place], [place] [address]    REPTSTATUS PENDING 09/29/2022 0981  Radiology Studies: CARDIAC CATHETERIZATION  Result Date: 09/30/2022   Ramus-2 lesion is 75% stenosed.   Ramus-1 lesion is 80% stenosed.   Mid LAD lesion is 75% stenosed.   Ost LAD to Prox LAD lesion is 50% stenosed.   Ost Cx to Prox Cx lesion is 60% stenosed.   Mid LM to Ost LAD lesion is 40% stenosed.   Ost RCA to Prox RCA lesion is 50% stenosed.   RPDA lesion is 75% stenosed.   LV end diastolic pressure is normal.   There is no aortic valve stenosis.   Severe right subclavian tortuosity.  85 cm destination sheath not available today.  Right groin approach used. Moderate disease in the distal left main, ostial LAD and ostial circumflex.  Severe disease in the large ramus and mid LAD.  Will obtain surgical consultation.  I discussed the findings with the wife  and specifically asked about the DNR status.  It was meant to be more related to 'he would not want to live on a vent if he had brain damage.' If he was not a candidate for CABG due to other comorbidities, could consider PCI of the mid LAD and medical therapy of the ramus vessel and other moderate disease.   CT Angio Chest PE W and/or Wo Contrast  Result Date: 09/29/2022 CLINICAL DATA:  Pulmonary embolism (PE) suspected, low to intermediate prob, positive D-dimer EXAM: CT ANGIOGRAPHY CHEST WITH CONTRAST TECHNIQUE: Multidetector CT imaging of the chest was performed using the standard protocol during bolus administration of intravenous contrast. Multiplanar CT image reconstructions and MIPs were obtained to evaluate the vascular anatomy. RADIATION DOSE REDUCTION: This exam was performed according to the departmental dose-optimization program which includes automated exposure control, adjustment of the mA and/or kV according to patient size and/or use of iterative reconstruction technique. CONTRAST:  75mL OMNIPAQUE IOHEXOL 350 MG/ML SOLN COMPARISON:  11/26/2021 FINDINGS: Cardiovascular: Satisfactory opacification of the pulmonary arteries to the segmental level. No evidence  of pulmonary embolism. Thoracic aorta is nonaneurysmal. Atherosclerotic vascular calcifications of the aorta and coronary arteries. Normal heart size. No pericardial effusion. Mediastinum/Nodes: No enlarged mediastinal, hilar, or axillary lymph nodes. Thyroid gland and trachea demonstrate no significant findings. Distal esophagus is mildly thickened. Lungs/Pleura: Passive atelectasis within the dependent lung fields. No focal consolidation. No pleural effusion or pneumothorax. Upper Abdomen: No acute abnormality. Musculoskeletal: No chest wall abnormality. No acute or significant osseous findings. Prior cement augmentation of the T5 vertebral body. Review of the MIP images confirms the above findings. IMPRESSION: 1. No evidence of pulmonary  embolism or other acute intrathoracic findings. 2. Mild thickening of the distal esophagus, which can be seen in the setting of esophagitis. 3. Aortic and coronary artery atherosclerosis (ICD10-I70.0). Electronically Signed   By: Duanne Guess D.O.   On: 09/29/2022 09:15   DG Chest Port 1 View  Result Date: 09/29/2022 CLINICAL DATA:  Sepsis. EXAM: PORTABLE CHEST 1 VIEW COMPARISON:  11/24/2021 FINDINGS: The heart size and mediastinal contours are within normal limits. Both lungs are clear. Previous midthoracic spine vertebroplasty noted. IMPRESSION: No active disease. Electronically Signed   By: Danae Orleans M.D.   On: 09/29/2022 08:03     LOS: 0 days   Lanae Boast, MD [facility]  09/30/2022, 12:38 PM

## 2022-09-30 NOTE — Progress Notes (Signed)
Rounding Note    Patient Name: William Hunter Date of Encounter: 09/30/2022  [facility] Cardiologist: Donato Schultz, MD   Subjective   Denies any chest pain or dyspnea  Inpatient Medications    Scheduled Meds:  amLODipine  5 mg Oral Daily   [START ON 10/01/2022] aspirin  81 mg Oral Daily   carbidopa-levodopa  1 tablet Oral TID   folic acid  1 mg Oral Daily   hydroxychloroquine  200 mg Oral Daily   insulin aspart  0-9 Units Subcutaneous TID WC   mirabegron ER  25 mg Oral Daily   sodium chloride flush  3 mL Intravenous Q12H   sodium chloride flush  3 mL Intravenous Q12H   Continuous Infusions:  sodium chloride 100 mL/hr at 09/30/22 1034   sodium chloride     PRN Meds: sodium chloride, acetaminophen, hydrALAZINE, labetalol, meclizine, nitroGLYCERIN, ondansetron (ZOFRAN) IV, sodium chloride flush   Vital Signs    Vitals:   09/30/22 0406 09/30/22 0802 09/30/22 0917 09/30/22 1029  BP: 139/85 131/85  106/65  Pulse: 94 87  80  Resp: Temp: 98.6 F (37 C) 98 F (36.7 C)  98.1 F (36.7 C)  TempSrc: Oral Oral  Oral  SpO2: 95% 96% 99% 97%  Weight: 78.8 kg     Height:        Intake/Output Summary (Last 24 hours) at 09/30/2022 1042 Last data filed at 09/30/2022 0600 Gross per 24 hour  Intake 539.8 ml  Output 280 ml  Net 259.8 ml      09/30/2022    4:06 AM 09/29/2022    6:42 AM 09/26/2022    8:08 AM  Last 3 Weights  Weight (lbs) 173 lb 11.6 oz 173 lb 173 lb  Weight (kg) 78.8 kg 78.472 kg 78.472 kg      Telemetry    NSR - Personally Reviewed  ECG    No new ECG - Personally Reviewed  Physical Exam   GEN: No acute distress.   Neck: No JVD Cardiac: RRR, no murmurs, rubs, or gallops.  Respiratory: Clear to auscultation bilaterally. GI: Soft, nontender, non-distended  MS: No edema; No deformity. Neuro:  Nonfocal  Psych: Normal affect   Labs    High Sensitivity Troponin:   Recent Labs  Lab 09/29/22 0654 09/29/22 0914  TROPONINIHS  534* 357*     Chemistry Recent Labs  Lab 09/26/22 0939 09/29/22 0654 09/30/22 0241  NA 135 134* 134*  K 3.9 3.4* 3.6  CL 101 101 97*  CO2 GLUCOSE 182* 185* 119*  BUN CREATININE 0.91 0.90 0.80  CALCIUM 8.9 8.5* 8.7*  PROT 8.0 7.4 6.6  ALBUMIN  --  3.1* 2.7*  AST ALT ALKPHOS  --  59 62  BILITOT 0.8 0.6 0.8  GFRNONAA  --  >60 >60  ANIONGAP  --  9 15    Lipids No results for input(s): "CHOL", "TRIG", "HDL", "LABVLDL", "LDLCALC", "CHOLHDL" in the last 168 hours.  Hematology Recent Labs  Lab 09/26/22 0939 09/29/22 0654 09/30/22 0241  WBC 20.9* 9.5 15.5*  RBC 5.05 4.70 4.37  HGB 15.9 14.8 13.9  HCT 46.3 42.5 39.9  MCV 91.7 90.4 91.3  MCH 31.5 31.5 31.8  MCHC 34.3 34.8 34.8  RDW 12.8 13.1 13.1  PLT 301 277 218   Thyroid  Recent Labs  Lab 09/29/22 1224  TSH 0.130*  BNP Recent Labs  Lab 09/29/22 1926  BNP 195.4*    DDimer  Recent Labs  Lab 09/29/22 0654  DDIMER 0.97*     Radiology    CARDIAC CATHETERIZATION  Result Date: 09/30/2022   Ramus-2 lesion is 75% stenosed.   Ramus-1 lesion is 80% stenosed.   Mid LAD lesion is 75% stenosed.   Ost LAD to Prox LAD lesion is 50% stenosed.   Ost Cx to Prox Cx lesion is 60% stenosed.   Mid LM to Ost LAD lesion is 40% stenosed.   Ost RCA to Prox RCA lesion is 50% stenosed.   RPDA lesion is 75% stenosed.   LV end diastolic pressure is normal.   There is no aortic valve stenosis. Moderate disease in the distal left main, ostial LAD and ostial circumflex.  Severe disease in the large ramus and mid LAD.  Will obtain surgical consultation.  I discussed the findings with the wife and specifically asked about the DNR status.  It was meant to be more related to 'he would not want to live on a vent if he had brain damage.' If he was not a candidate for CABG due to other comorbidities, could consider PCI of the mid LAD and medical therapy of the ramus vessel and other moderate disease.   CT  Angio Chest PE W and/or Wo Contrast  Result Date: 09/29/2022 CLINICAL DATA:  Pulmonary embolism (PE) suspected, low to intermediate prob, positive D-dimer EXAM: CT ANGIOGRAPHY CHEST WITH CONTRAST TECHNIQUE: Multidetector CT imaging of the chest was performed using the standard protocol during bolus administration of intravenous contrast. Multiplanar CT image reconstructions and MIPs were obtained to evaluate the vascular anatomy. RADIATION DOSE REDUCTION: This exam was performed according to the departmental dose-optimization program which includes automated exposure control, adjustment of the mA and/or kV according to patient size and/or use of iterative reconstruction technique. CONTRAST:  75mL OMNIPAQUE IOHEXOL 350 MG/ML SOLN COMPARISON:  11/26/2021 FINDINGS: Cardiovascular: Satisfactory opacification of the pulmonary arteries to the segmental level. No evidence of pulmonary embolism. Thoracic aorta is nonaneurysmal. Atherosclerotic vascular calcifications of the aorta and coronary arteries. Normal heart size. No pericardial effusion. Mediastinum/Nodes: No enlarged mediastinal, hilar, or axillary lymph nodes. Thyroid gland and trachea demonstrate no significant findings. Distal esophagus is mildly thickened. Lungs/Pleura: Passive atelectasis within the dependent lung fields. No focal consolidation. No pleural effusion or pneumothorax. Upper Abdomen: No acute abnormality. Musculoskeletal: No chest wall abnormality. No acute or significant osseous findings. Prior cement augmentation of the T5 vertebral body. Review of the MIP images confirms the above findings. IMPRESSION: 1. No evidence of pulmonary embolism or other acute intrathoracic findings. 2. Mild thickening of the distal esophagus, which can be seen in the setting of esophagitis. 3. Aortic and coronary artery atherosclerosis (ICD10-I70.0). Electronically Signed   By: Duanne Guess D.O.   On: 09/29/2022 09:15   DG Chest Port 1 View  Result Date:  09/29/2022 CLINICAL DATA:  Sepsis. EXAM: PORTABLE CHEST 1 VIEW COMPARISON:  11/24/2021 FINDINGS: The heart size and mediastinal contours are within normal limits. Both lungs are clear. Previous midthoracic spine vertebroplasty noted. IMPRESSION: No active disease. Electronically Signed   By: Danae Orleans M.D.   On: 09/29/2022 08:03    Cardiac Studies     Patient Profile     77 y.o. male with a hx of MI in 1981 with no CAD at cath >> vasospasm, recurrent DVT/PE, DM, HTN, HLD intol statins, aortic atherosclerosis, who is being seen for the evaluation of  chest pain and elevated troponin   Assessment & Plan   NSTEMI: Presented with chest pain.  Troponin 534 > 357.  Currently chest pain-free -Echocardiogram -LHC today showed severe multivessel CAD (80% ramus, 75% mid LAD, 50% ostial to proximal LAD, 60% ostial to proximal LCX, 40% left main extending to ostial LAD, 50% ostial to proximal RCA, 75% RPDA).  Cardiac surgery consulted for CABG evaluation -Continue aspirin 81 mg daily, restart heparin drip once OK after cath -Unable to tolerate statins.  Will need referral to lipid clinic as outpatient for PCSK9 inhibitor  Hypertension: On amlodipine 5 mg daily, appears controlled.  Rheumatoid arthritis: On Humira and Plaquenil  History of PE/DVT: Has been off Eliquis.  Currently on heparin drip as above.  CTPA on admission showed no PE  For questions or updates, please contact [facility] Please consult [email or web address] for contact info under        Signed, Little Ishikawa, MD  09/30/2022, 10:42 AM

## 2022-09-30 NOTE — Telephone Encounter (Signed)
Patient's wife called and states that patient is currently admitted in the hospital and has several heart blockages which will require bypass surgery.   Patient's wife states he receives his humira from [employer] [employer]. (I have added that to preferred pharmacies).

## 2022-10-01 ENCOUNTER — Encounter (HOSPITAL_COMMUNITY): Payer: Self-pay | Admitting: Internal Medicine

## 2022-10-01 DIAGNOSIS — Z86711 Personal history of pulmonary embolism: Secondary | ICD-10-CM | POA: Diagnosis not present

## 2022-10-01 DIAGNOSIS — I10 Essential (primary) hypertension: Secondary | ICD-10-CM | POA: Diagnosis not present

## 2022-10-01 DIAGNOSIS — I214 Non-ST elevation (NSTEMI) myocardial infarction: Secondary | ICD-10-CM | POA: Diagnosis not present

## 2022-10-01 LAB — COMPREHENSIVE METABOLIC PANEL
ALT: <5 U/L (ref 0–44)
AST: 16 U/L (ref 15–41)
Albumin: 2.5 g/dL — ABNORMAL LOW (ref 3.5–5.0)
Alkaline Phosphatase: 61 U/L (ref 38–126)
Anion gap: 10 (ref 5–15)
BUN: 15 mg/dL (ref 8–23)
CO2: 25 mmol/L (ref 22–32)
Calcium: 8.6 mg/dL — ABNORMAL LOW (ref 8.9–10.3)
Chloride: 99 mmol/L (ref 98–111)
Creatinine, Ser: 0.85 mg/dL (ref 0.61–1.24)
GFR, Estimated: >60 mL/min (ref >60)
Glucose, Bld: 170 mg/dL — ABNORMAL HIGH (ref 70–99)
Potassium: 3.3 mmol/L — ABNORMAL LOW (ref 3.5–5.1)
Sodium: 134 mmol/L — ABNORMAL LOW (ref 135–145)
Total Bilirubin: 0.6 mg/dL (ref 0.3–1.2)
Total Protein: 6.7 g/dL (ref 6.5–8.1)

## 2022-10-01 LAB — GLUCOSE, CAPILLARY
Glucose-Capillary: 185 mg/dL — ABNORMAL HIGH (ref 70–99)
Glucose-Capillary: 238 mg/dL — ABNORMAL HIGH (ref 70–99)
Glucose-Capillary: 263 mg/dL — ABNORMAL HIGH (ref 70–99)
Glucose-Capillary: 210 mg/dL — ABNORMAL HIGH (ref 70–99)

## 2022-10-01 LAB — CBC
HCT: 39.7 % (ref 39.0–52.0)
Hemoglobin: 13.4 g/dL (ref 13.0–17.0)
MCH: 31.1 pg (ref 26.0–34.0)
MCHC: 33.8 g/dL (ref 30.0–36.0)
MCV: 92.1 fL (ref 80.0–100.0)
Platelets: 258 10*3/uL (ref 150–400)
RBC: 4.31 MIL/uL (ref 4.22–5.81)
RDW: 13 % (ref 11.5–15.5)
WBC: 9.2 10*3/uL (ref 4.0–10.5)
nRBC: 0 % (ref 0.0–0.2)

## 2022-10-01 LAB — HEPARIN LEVEL (UNFRACTIONATED)
Heparin Unfractionated: 0.16 IU/mL — ABNORMAL LOW (ref 0.30–0.70)
Heparin Unfractionated: 0.25 IU/mL — ABNORMAL LOW (ref 0.30–0.70)

## 2022-10-01 MED ORDER — HEPARIN (PORCINE) 25000 UT/250ML-% IV SOLN
1700.0000 [IU]/h | INTRAVENOUS | Status: DC
  Administered 2022-10-01: 1250 [IU]/h via INTRAVENOUS
  Administered 2022-10-01: 1700 [IU]/h via INTRAVENOUS
  Filled 2022-10-01 (×2): qty 250

## 2022-10-01 MED ORDER — ISOSORBIDE MONONITRATE ER 30 MG PO TB24
15.0000 mg | ORAL_TABLET | Freq: Every day | ORAL | Status: DC
  Administered 2022-10-01 – 2022-10-03 (×3): 15 mg via ORAL
  Filled 2022-10-01 (×3): qty 1

## 2022-10-01 MED ORDER — POTASSIUM CHLORIDE CRYS ER 20 MEQ PO TBCR
40.0000 meq | EXTENDED_RELEASE_TABLET | Freq: Once | ORAL | Status: AC
  Administered 2022-10-01: 40 meq via ORAL
  Filled 2022-10-01: qty 2

## 2022-10-01 MED ORDER — SODIUM CHLORIDE 0.9% FLUSH
3.0000 mL | Freq: Two times a day (BID) | INTRAVENOUS | Status: DC
  Administered 2022-10-01 – 2022-10-03 (×5): 3 mL via INTRAVENOUS

## 2022-10-01 MED FILL — Heparin Sodium (Porcine) Inj 1000 Unit/ML: INTRAMUSCULAR | Qty: 10 | Status: AC

## 2022-10-01 NOTE — Progress Notes (Addendum)
Rounding Note    Patient Name: William Hunter Date of Encounter: 10/01/2022  [facility] Cardiologist: Donato Schultz, MD   Subjective   He had some chest pain last night and this morning, both episodes relieved with tylenol. No current chest pain, can consider uptitration of medical therapy  Inpatient Medications    Scheduled Meds:  amLODipine  5 mg Oral Daily   aspirin  81 mg Oral Daily   carbidopa-levodopa  1 tablet Oral TID   folic acid  1 mg Oral Daily   hydroxychloroquine  200 mg Oral Daily   insulin aspart  0-5 Units Subcutaneous QHS   insulin aspart  0-9 Units Subcutaneous TID WC   mirabegron ER  25 mg Oral Daily   sodium chloride flush  3 mL Intravenous Q12H   sodium chloride flush  3 mL Intravenous Q12H   Continuous Infusions:  sodium chloride     heparin 1,250 Units/hr (10/01/22 0322)   PRN Meds: sodium chloride, acetaminophen, meclizine, nitroGLYCERIN, ondansetron (ZOFRAN) IV, sodium chloride flush   Vital Signs    Vitals:   09/30/22 1604 09/30/22 1933 10/01/22 0312 10/01/22 0800  BP: 139/85 126/69 (!) 141/80 136/77  Pulse: 89 92 71 74  Resp: Temp: 98.1 F (36.7 C) 98 F (36.7 C) 97.6 F (36.4 C) 98.4 F (36.9 C)  TempSrc: Oral Oral Oral Oral  SpO2: 95% 96% 98% 96%  Weight:      Height:        Intake/Output Summary (Last 24 hours) at 10/01/2022 1112 Last data filed at 10/01/2022 6962 Gross per 24 hour  Intake 757.48 ml  Output 1200 ml  Net -442.52 ml      09/30/2022    4:06 AM 09/29/2022    6:42 AM 09/26/2022    8:08 AM  Last 3 Weights  Weight (lbs) 173 lb 11.6 oz 173 lb 173 lb  Weight (kg) 78.8 kg 78.472 kg 78.472 kg      Telemetry    Sinus rhythm with HR 70 - Personally Reviewed  ECG    No new tracings - Personally Reviewed  Physical Exam   GEN: No acute distress.   Neck: No JVD Cardiac: RRR, no murmurs, rubs, or gallops.  Respiratory: Clear to auscultation bilaterally. GI: Soft, nontender, non-distended   MS: No edema; No deformity. Neuro:  Nonfocal  Psych: Normal affect  Cath site C/D?I  Labs    High Sensitivity Troponin:   Recent Labs  Lab 09/29/22 0654 09/29/22 0914  TROPONINIHS 534* 357*     Chemistry Recent Labs  Lab 09/29/22 0654 09/30/22 0241 10/01/22 0307  NA 134* 134* 134*  K 3.4* 3.6 3.3*  CL 101 97* 99  CO2 GLUCOSE 185* 119* 170*  BUN CREATININE 0.90 0.80 0.85  CALCIUM 8.5* 8.7* 8.6*  PROT 7.4 6.6 6.7  ALBUMIN 3.1* 2.7* 2.5*  AST ALT 19 5 <5  ALKPHOS 59 62 61  BILITOT 0.6 0.8 0.6  GFRNONAA >60 >60 >60  ANIONGAP Lipids No results for input(s): "CHOL", "TRIG", "HDL", "LABVLDL", "LDLCALC", "CHOLHDL" in the last 168 hours.  Hematology Recent Labs  Lab 09/29/22 0654 09/30/22 0241 10/01/22 0307  WBC 9.5 15.5* 9.2  RBC 4.70 4.37 4.31  HGB 14.8 13.9 13.4  HCT 42.5 39.9 39.7  MCV 90.4 91.3 92.1  MCH 31.5 31.8 31.1  MCHC 34.8 34.8 33.8  RDW 13.1 13.1 13.0  PLT 277 218 258   Thyroid  Recent Labs  Lab 09/29/22 1224  TSH 0.130*    BNP Recent Labs  Lab 09/29/22 1926  BNP 195.4*    DDimer  Recent Labs  Lab 09/29/22 0654  DDIMER 0.97*     Radiology    CARDIAC CATHETERIZATION  Result Date: 09/30/2022   Ramus-2 lesion is 75% stenosed.   Ramus-1 lesion is 80% stenosed.   Mid LAD lesion is 75% stenosed.   Ost LAD to Prox LAD lesion is 50% stenosed.   Ost Cx to Prox Cx lesion is 60% stenosed.   Mid LM to Ost LAD lesion is 40% stenosed.   Ost RCA to Prox RCA lesion is 50% stenosed.   RPDA lesion is 75% stenosed.   LV end diastolic pressure is normal.   There is no aortic valve stenosis.   Severe right subclavian tortuosity.  85 cm destination sheath not available today.  Right groin approach used. Moderate disease in the distal left main, ostial LAD and ostial circumflex.  Severe disease in the large ramus and mid LAD.  Will obtain surgical consultation.  I discussed the findings with the wife and specifically  asked about the DNR status.  It was meant to be more related to 'he would not want to live on a vent if he had brain damage.' If he was not a candidate for CABG due to other comorbidities, could consider PCI of the mid LAD and medical therapy of the ramus vessel and other moderate disease.    Cardiac Studies   Echo final read pending  LHC 09/30/22:   Ramus-2 lesion is 75% stenosed.   Ramus-1 lesion is 80% stenosed.   Mid LAD lesion is 75% stenosed.   Ost LAD to Prox LAD lesion is 50% stenosed.   Ost Cx to Prox Cx lesion is 60% stenosed.   Mid LM to Ost LAD lesion is 40% stenosed.   Ost RCA to Prox RCA lesion is 50% stenosed.   RPDA lesion is 75% stenosed.   LV end diastolic pressure is normal.   There is no aortic valve stenosis.   Severe right subclavian tortuosity.  85 cm destination sheath not available today.  Right groin approach used.   Moderate disease in the distal left main, ostial LAD and ostial circumflex.  Severe disease in the large ramus and mid LAD.  Will obtain surgical consultation.  I discussed the findings with the wife and specifically asked about the DNR status.  It was meant to be more related to 'he would not want to live on a vent if he had brain damage.'    If he was not a candidate for CABG due to other comorbidities, could consider PCI of the mid LAD and medical therapy of the ramus vessel and other moderate disease.    Patient Profile     77 y.o. male  with a hx of MI in 73 with no CAD at cath >> vasospasm, recurrent DVT/PE, DM, HTN, HLD intol statins, aortic atherosclerosis, who is being seen for the evaluation of chest pain and elevated troponin.  Assessment & Plan    NSTEMI with severe multivessel diseae Chest pain, HS troponin 534 --> 357 Echo today Heart cath yesterday wit severe multivessel disease recommended for evaluation by CT surgery for CABG Continue ASA and heparin gtt CT surgery saw yesterday and recommended PCI and medical therapy given  poor mobility - will add imdur given ongoing CP -  15 mg imdur - Plan for PCI of mid LAD lesion tomorrow   Hyperlipidemia with LDL goal < 70 Will update a lipid panel Statin intolerant Will need PCSK9i / lipid clinic referral   Hypertension Continue 5 mg amlodipine   RA Humira, plaquenil at home   Hx of PE/DVT Off eliquis, now on heparin gtt   Disposition Will order PT/OT since his mobility is limited Also, wife is pending surgery tomorrow, no one at home to help him May need rehab      For questions or updates, please contact [facility] Please consult [email or web address] for contact info under        Signed, Marcelino Duster, PA  10/01/2022, 11:12 AM     Patient seen and examined.  Agree with above documentation.  On exam, patient is alert and oriented, regular rate and rhythm, no murmurs, lungs CTAB, no LE edema or JVD.  Seen by CT surgery, not felt to be surgical candidate.  PCI recommended.  Dr. Eldridge Dace had recommended PCI of mid LAD if not a CABG candidate.  Will plan for PCI tomorrow.  Little Ishikawa, MD

## 2022-10-01 NOTE — Progress Notes (Signed)
ANTICOAGULATION CONSULT NOTE  Pharmacy Consult for heparin Indication: chest pain/ACS  Allergies  Allergen Reactions   Codeine Swelling    Other reaction(s): rash   Penicillins Hives and Swelling    Has patient had a PCN reaction causing immediate rash, facial/tongue/throat swelling, SOB or lightheadedness with hypotension: Yes Has patient had a PCN reaction causing severe rash involving mucus membranes or skin necrosis: No Has patient had a PCN reaction that required hospitalization No Has patient had a PCN reaction occurring within the last 10 years: No If all of the above answers are "NO", then may proceed with Cephalosporin use.    Prednisone Hives    Other reaction(s): hives   Wool Alcohol [Lanolin] Hives   Empagliflozin Nausea And Vomiting    Other reaction(s): nausea and vomiting   Penicillin G     Other reaction(s): rash   Statins     Other reaction(s): myopathy    Patient Measurements: Height: 5\' 10"  (177.8 cm) Weight: 78.8 kg (173 lb 11.6 oz) IBW/kg (Calculated) : 73 Heparin Dosing Weight: TBW  Vital Signs: Temp: 98.7 F (37.1 C) (04/23 1933) Temp Source: Oral (04/23 1933) BP: 114/66 (04/23 1933) Pulse Rate: 80 (04/23 1933)  Labs: Recent Labs    09/29/22 0654 09/29/22 0914 09/29/22 1800 09/29/22 2150 09/30/22 0241 10/01/22 0307 10/01/22 1028 10/01/22 1930  HGB 14.8  --   --   --  13.9 13.4  --   --   HCT 42.5  --   --   --  39.9 39.7  --   --   PLT 277  --   --   --  218 258  --   --   APTT 26  --   --  66*  --   --   --   --   LABPROT 13.3  --   --   --   --   --   --   --   INR 1.0  --   --   --   --   --   --   --   HEPARINUNFRC  --   --    < >  --  0.20*  --  0.16* 0.25*  CREATININE 0.90  --   --   --  0.80 0.85  --   --   TROPONINIHS 534* 357*  --   --   --   --   --   --    < > = values in this interval not displayed.     Estimated Creatinine Clearance: 78.7 mL/min (by C-G formula based on SCr of 0.85 mg/dL).   Medical History: Past  Medical History:  Diagnosis Date   Anginal pain    Arthritis    "fingers" (03/09/2012)   Cold feet    "from my diabetes; they stay cold" (03/09/2012)   Coronary artery disease    Coronary artery spasm    since age 23 Dr Aleen Campi   COVID 07/06/2019   hospitalizied   Dyslipidemia    Femoral DVT (deep venous thrombosis)    left   Headache(784.0)    Hx of gallstones    Dr Derrell Lolling lap cholecystectomy   Hx of plastic surgery    plastic surgery following a fall off a loading dock ,lost  all his upper teeth    Hypertension    MI, old    at age 89   Migraines    Pneumonia ~ 1962; 1970's   "double"; "regular" (03/09/2012)  Rheumatoid arthritis    Dr Dierdre Forth    Slipped cervical disc 06/10/2006   Dr Patsi Sears in the past/ Dr Larita Fife , chiropractor in 2008   Thyroid disease    Dr Lucianne Muss   Type II diabetes mellitus     Medications:  Facility-Administered Medications Prior to Admission  Medication Dose Route Frequency Provider Last Rate Last Admin   Study - ORION 4 - inclisiran 300 mg/1.26mL or placebo SQ injection (PI-Stuckey)  300 mg Subcutaneous Q6 months Herby Abraham, MD       Medications Prior to Admission  Medication Sig Dispense Refill Last Dose   apixaban (ELIQUIS) 5 MG TABS tablet Take 10 mg p.o. twice daily, on  Wed 12/05/21 at 1000-switch to 5 mg p.o. twice daily. 60 tablet  Past Month   carbidopa-levodopa (SINEMET IR) 25-100 MG tablet Take 1 tablet by mouth 3 (three) times daily. 90 tablet 6 09/28/2022   folic acid (FOLVITE) 1 MG tablet Take 1 tablet (1 mg total) by mouth daily. 90 tablet 3 09/28/2022   GVOKE HYPOPEN 2-PACK 1 MG/0.2ML SOAJ Inject into the skin.      hydroxychloroquine (PLAQUENIL) 200 MG tablet Take 1 tablet (200 mg total) by mouth daily. 90 tablet 0 09/28/2022   insulin aspart (NOVOLOG) 100 UNIT/ML injection 0-5 Units, Subcutaneous, Daily at bedtime: HS scale CBG < 70: implement hypoglycemia measures CBG 70 - 120: 0 units CBG 121 - 150: 0 units CBG 151 - 200:  0 units CBG 201 - 250: 2 units CBG 251 - 300: 3 units CBG 301 - 350: 4 units CBG 351 - 400: 5 units CBG > 400: call MD and obtain STAT lab verification 10 mL 11 09/28/2022   insulin aspart (NOVOLOG) 100 UNIT/ML injection 0-15 Units, Subcutaneous, 3 times daily with meals CBG < 70: Implement Hypoglycemia Standing Orders and refer to Hypoglycemia Standing Orders sidebar report CBG 70 - 120: 0 units CBG 121 - 150: 2 units CBG 151 - 200: 3 units CBG 201 - 250: 5 units CBG 251 - 300: 8 units CBG 301 - 350: 11 units CBG 351 - 400: 15 units CBG > 400: call MD and obtain STAT lab verification 10 mL 11 09/28/2022   meclizine (ANTIVERT) 25 MG tablet Take 25 mg by mouth 3 (three) times daily as needed for dizziness.      methotrexate (RHEUMATREX) 2.5 MG tablet Take 6 tablets (15 mg total) by mouth once a week. Caution:Chemotherapy. Protect from light. 72 tablet 0 Past Week   MYRBETRIQ 25 MG TB24 tablet Take 25 mg by mouth daily.   09/28/2022   BD PEN NEEDLE MICRO U/F 32G X 6 MM MISC Inject into the skin 3 (three) times daily.      BD PEN NEEDLE NANO 2ND GEN 32G X 4 MM MISC       Continuous Glucose Sensor (FREESTYLE LIBRE 2 SENSOR) MISC Place onto the skin.      diltiazem (TIAZAC) 300 MG 24 hr capsule Take 1 capsule (300 mg total) by mouth daily. (Patient not taking: Reported on 09/26/2022) 90 capsule 3    Study - ORION 4 - inclisiran 300 mg/1.75mL or placebo SQ injection (PI-Stuckey) Inject 1.5 mLs (300 mg total) into the skin every 6 (six) months. (Patient not taking: Reported on 09/26/2022) 1 mL 1     Assessment: 76 YOM presenting with CP, elevated troponin, CBC wnl, has been on Eliquis for hx of PE however has run out of this and states he has not  taken in at least 3 weeks. Heparin was stopped prior to cath 4/22. Pharmacy consulted to restart.  Heparin level uptrend however remains subtherapeutic s/p rate increase to 1500 units/hr  Goal of Therapy:  Heparin level 0.3-0.7 units/ml Monitor platelets by  anticoagulation protocol: Yes   Plan:  Increase heparin gtt to 1700 units/hr F/u 8 hour heparin level with AM labs  Daylene Posey, PharmD, [facility] Clinical Pharmacist ED Pharmacist Phone # [phone] 10/01/2022 8:38 PM

## 2022-10-01 NOTE — Progress Notes (Signed)
ANTICOAGULATION CONSULT NOTE - Initial Consult  Pharmacy Consult for heparin Indication: chest pain/ACS  Allergies  Allergen Reactions   Codeine Swelling    Other reaction(s): rash   Penicillins Hives and Swelling    Has patient had a PCN reaction causing immediate rash, facial/tongue/throat swelling, SOB or lightheadedness with hypotension: Yes Has patient had a PCN reaction causing severe rash involving mucus membranes or skin necrosis: No Has patient had a PCN reaction that required hospitalization No Has patient had a PCN reaction occurring within the last 10 years: No If all of the above answers are "NO", then may proceed with Cephalosporin use.    Prednisone Hives    Other reaction(s): hives   Wool Alcohol [Lanolin] Hives   Empagliflozin Nausea And Vomiting    Other reaction(s): nausea and vomiting   Penicillin G     Other reaction(s): rash   Statins     Other reaction(s): myopathy    Patient Measurements: Height:  (177.8 cm) Weight: 78.8 kg (173 lb 11.6 oz) IBW/kg (Calculated) : 73 Heparin Dosing Weight: TBW  Vital Signs: Temp: 98 F (36.7 C) (04/22 1933) Temp Source: Oral (04/22 1933) BP: 126/69 (04/22 1933) Pulse Rate: 92 (04/22 1933)  Labs: Recent Labs    09/29/22 0654 09/29/22 0914 09/29/22 1800 09/29/22 2150 09/30/22 0241  HGB 14.8  --   --   --  13.9  HCT 42.5  --   --   --  39.9  PLT 277  --   --   --  218  APTT 26  --   --  66*  --   LABPROT 13.3  --   --   --   --   INR 1.0  --   --   --   --   HEPARINUNFRC  --   --  <0.10*  --  0.20*  CREATININE 0.90  --   --   --  0.80  TROPONINIHS 534* 357*  --   --   --     Estimated Creatinine Clearance: 83.6 mL/min (by C-G formula based on SCr of 0.8 mg/dL).   Medical History: Past Medical History:  Diagnosis Date   Anginal pain    Arthritis    "fingers" (03/09/2012)   Cold feet    "from my diabetes; they stay cold" (03/09/2012)   Coronary artery disease    Coronary artery spasm     since age 32 Dr Aleen Campi   COVID 07/06/2019   hospitalizied   Dyslipidemia    Femoral DVT (deep venous thrombosis)    left   Headache(784.0)    Hx of gallstones    Dr Derrell Lolling lap cholecystectomy   Hx of plastic surgery    plastic surgery following a fall off a loading dock ,lost  all his upper teeth    Hypertension    MI, old    at age 89   Migraines    Pneumonia ~ 1962; 1970's   "double"; "regular" (03/09/2012)   Rheumatoid arthritis    Dr Dierdre Forth    Slipped cervical disc 06/10/2006   Dr Patsi Sears in the past/ Dr Larita Fife , chiropractor in 2008   Thyroid disease    Dr Lucianne Muss   Type II diabetes mellitus     Medications:  Facility-Administered Medications Prior to Admission  Medication Dose Route Frequency Provider Last Rate Last Admin   Study - ORION 4 - inclisiran 300 mg/1.56mL or placebo SQ injection (PI-Stuckey)  300 mg Subcutaneous Q6 months Stuckey,  Arturo Morton, MD       Medications Prior to Admission  Medication Sig Dispense Refill Last Dose   apixaban (ELIQUIS) 5 MG TABS tablet Take 10 mg p.o. twice daily, on  Wed 12/05/21 at 1000-switch to 5 mg p.o. twice daily. 60 tablet  Past Month   carbidopa-levodopa (SINEMET IR) 25-100 MG tablet Take 1 tablet by mouth 3 (three) times daily. 90 tablet 6 09/28/2022   folic acid (FOLVITE) 1 MG tablet Take 1 tablet (1 mg total) by mouth daily. 90 tablet 3 09/28/2022   GVOKE HYPOPEN 2-PACK 1 MG/0.2ML SOAJ Inject into the skin.      hydroxychloroquine (PLAQUENIL) 200 MG tablet Take 1 tablet (200 mg total) by mouth daily. 90 tablet 0 09/28/2022   insulin aspart (NOVOLOG) 100 UNIT/ML injection 0-5 Units, Subcutaneous, Daily at bedtime: HS scale CBG < 70: implement hypoglycemia measures CBG 70 - 120: 0 units CBG 121 - 150: 0 units CBG 151 - 200: 0 units CBG 201 - 250: 2 units CBG 251 - 300: 3 units CBG 301 - 350: 4 units CBG 351 - 400: 5 units CBG > 400: call MD and obtain STAT lab verification 10 mL 11 09/28/2022   insulin aspart (NOVOLOG) 100 UNIT/ML  injection 0-15 Units, Subcutaneous, 3 times daily with meals CBG < 70: Implement Hypoglycemia Standing Orders and refer to Hypoglycemia Standing Orders sidebar report CBG 70 - 120: 0 units CBG 121 - 150: 2 units CBG 151 - 200: 3 units CBG 201 - 250: 5 units CBG 251 - 300: 8 units CBG 301 - 350: 11 units CBG 351 - 400: 15 units CBG > 400: call MD and obtain STAT lab verification 10 mL 11 09/28/2022   meclizine (ANTIVERT) 25 MG tablet Take 25 mg by mouth 3 (three) times daily as needed for dizziness.      methotrexate (RHEUMATREX) 2.5 MG tablet Take 6 tablets (15 mg total) by mouth once a week. Caution:Chemotherapy. Protect from light. 72 tablet 0 Past Week   MYRBETRIQ 25 MG TB24 tablet Take 25 mg by mouth daily.   09/28/2022   BD PEN NEEDLE MICRO U/F 32G X 6 MM MISC Inject into the skin 3 (three) times daily.      BD PEN NEEDLE NANO 2ND GEN 32G X 4 MM MISC       Continuous Glucose Sensor (FREESTYLE LIBRE 2 SENSOR) MISC Place onto the skin.      diltiazem (TIAZAC) 300 MG 24 hr capsule Take 1 capsule (300 mg total) by mouth daily. (Patient not taking: Reported on 09/26/2022) 90 capsule 3    Study - ORION 4 - inclisiran 300 mg/1.63mL or placebo SQ injection (PI-Stuckey) Inject 1.5 mLs (300 mg total) into the skin every 6 (six) months. (Patient not taking: Reported on 09/26/2022) 1 mL 1     Assessment: 77 YOM presenting with CP, elevated troponin, CBC wnl, has been on Eliquis for hx of PE however has run out of this and states he has not taken in at least 3 weeks. Heparin was stopped prior to cath 4/22. Pharmacy consulted to restart.  Goal of Therapy:  Heparin level 0.3-0.7 units/ml Monitor platelets by anticoagulation protocol: Yes   Plan:  Restart heparin infusion at 1250 units/hr Check anti-Xa level in 6-8 hours and daily while on heparin Continue to monitor H&H and platelets    Thank you for allowing [place] to participate in this patients care. Signe Colt, PharmD 10/01/2022 2:51  AM  **Pharmacist phone directory  can be found on [email or web address] listed under [facility]**

## 2022-10-01 NOTE — Progress Notes (Signed)
PROGRESS NOTE William Hunter  EAV:[id number] DOB: 18-Sep-1945 DOA: 09/29/2022 PCP: Irena Reichmann, DO  Brief Narrative/Hospital Course: 77 year old male with history of DVT/PE in 2021, has not had Eliquis since 2 weeks-prescription expired, history of CAD/coronary artery spasm had cardiac cath >>> vasospasm, hypertension, migraine diabetes mellitus,rheumatoid arthritis, slipped vertebral cervical disc presenting with chest pain over the last few weeks. He woke up from sleep earlier this a.m. and episode of chest pain, his usual angina with associated sweating lasting for an hour and resolved.  Pain lasted for 2 hours today and came to ED, In ED: temp 100.9, heart rate 113 BP in 140s to 160s systolic, saturating on room air Labs showed mild hyponatremia, hypokalemia with a stable renal function, troponin elevated at 534 357>, lactic acid 1.9, normal CBC, D-dimer 0.97.Reviewed chest x-ray clear.  CT angio chest no evidence of PE or acute intrathoracic finding, possible esophagitis, aortic and coronary artery atherosclerosis noted Cardiology was consulted, aspirin 81 placed on heparin drip and admission was requested for further management. Patient was admitted for NSTEMI Underwent left heart cath that showed severe three-vessel disease Seen by cardio thoracic surgery> but given his comorbidities felt that he would have serious limitations for recovery following CABG, although limited revascularization best option is probably PCI and medical therapy per [facility]   Subjective: Seen and examined. Had some chest pain earlier Past 24 hours patient has been afebrile.  BP stable. Labs with mild hypokalemia  Assessment and Plan: Principal Problem:   Non-ST elevation (NSTEMI) myocardial infarction Active Problems:   Coronary artery disease   Hypertension   Dyslipidemia   Coronary vasospasm ([name]   History of pulmonary embolism   Chest pain   Elevated troponin   NSTEMI CAD-coronary artery  atherosclerosis-coronary spasm history: Found to have showed severe three-vessel disease IN lhc Seen by cardio thoracic surgery> but given his comorbidities felt that he would have serious limitations for recovery following CABG, although limited revascularization best option is probably PCI and medical therapy per [facility] Follow-up repeat echo.  Continue aspirin 81,await cardiology plan regarding further management  Hypertension: BP stable on amlodipine    Dyslipidemia: Lpa pending  Esophagitis per CT scan: Continue PPI   Low-grade fever: Unclear etiology respiratory virus panel unremarkable chest x-ray and CT imaging no acute lung findings.  Resolved no recurrence   Hypokalemia replete   History of pulmonary embolism/VTE/ positive D-dimer:Now on heparin drip, d dimer slightly up> currently on heparin drip precath and held  ?Blood in pad-FOBT+: Hemoglobin remains stable no drop.  Patient denies any bleeding.Monitor> he will need to follow-up with GI as outpatient Recent Labs  Lab 09/26/22 0939 09/29/22 0654 09/30/22 0241 10/01/22 0307  HGB 15.9 14.8 13.9 13.4  HCT 46.3 42.5 39.9 39.7    T2DM: A1c 6.7, blood sugar well-controlled continue SSI.   Recent Labs  Lab 09/29/22 1926 09/30/22 0841 09/30/22 1421 09/30/22 1603 09/30/22 2107 09/30/22 2226 10/01/22 0759  GLUCAP  --    < > 115* 123* 235* 219* 185*  HGBA1C 6.7*  --   --   --   --   --   --    < > = values in this interval not displayed.     RA: On Humira and Plaquenil followed by rheumatology as outpatient   Code status:DNR.  DVT prophylaxis: heparin gtt Code Status:   Code Status: DNR Family Communication: plan of care discussed with patient at bedside. Patient status is:  inpatient  because of further cardiac workup  Level of care: Progressive   Dispo: The patient is from: home            Anticipated disposition: tbd Objective: Vitals last 24 hrs: Vitals:   09/30/22 1604 09/30/22 1933 10/01/22 0312 10/01/22  0800  BP: 139/85 126/69 (!) 141/80 136/77  Pulse: 89 92 71 74  Resp: 18 16 15 16   Temp: 98.1 F (36.7 C) 98 F (36.7 C) 97.6 F (36.4 C) 98.4 F (36.9 C)  TempSrc: Oral Oral Oral Oral  SpO2: 95% 96% 98% 96%  Weight:      Height:       Weight change:   Physical Examination: General exam: AAox3, weak,older appearing HEENT:Oral mucosa moist, Ear/Nose WNL grossly, dentition normal. Respiratory system: bilaterally clear BS, no use of accessory muscle Cardiovascular system: S1 & S2 +, regular rate. Gastrointestinal system: Abdomen soft, NT,ND,BS+ Nervous System:Alert, awake, moving extremities and grossly nonfocal Extremities: LE ankle edema neg, lower extremities warm Skin: No rashes,no icterus. MSK: Normal muscle bulk,tone, power   Medications reviewed:  Scheduled Meds:  amLODipine  5 mg Oral Daily   aspirin  81 mg Oral Daily   carbidopa-levodopa  1 tablet Oral TID   folic acid  1 mg Oral Daily   hydroxychloroquine  200 mg Oral Daily   insulin aspart  0-5 Units Subcutaneous QHS   insulin aspart  0-9 Units Subcutaneous TID WC   mirabegron ER  25 mg Oral Daily   sodium chloride flush  3 mL Intravenous Q12H   sodium chloride flush  3 mL Intravenous Q12H   Continuous Infusions:  sodium chloride     heparin 1,250 Units/hr (10/01/22 0322)    Diet Order             Diet Carb Modified Fluid consistency: Thin; Room service appropriate? Yes  Diet effective now                  Intake/Output Summary (Last 24 hours) at 10/01/2022 0837 Last data filed at 10/01/2022 8413 Gross per 24 hour  Intake 757.48 ml  Output 1450 ml  Net -692.52 ml   Net IO Since Admission: -432.72 mL [10/01/22 0837]  Wt Readings from Last 3 Encounters:  09/30/22 78.8 kg  09/26/22 78.5 kg  04/10/22 82.1 kg     Unresulted Labs (From admission, onward)     Start     Ordered   10/01/22 1100  Heparin level (unfractionated)  Once-Timed,   TIMED       Question:  Specimen collection method  Answer:   Lab=Lab collect   10/01/22 0302   10/01/22 0500  Lipoprotein A (LPA)  Tomorrow morning,   R       Question:  Specimen collection method  Answer:  Lab=Lab collect   09/30/22 1031   10/01/22 0500  Basic metabolic panel  Daily,   R     Question:  Specimen collection method  Answer:  Lab=Lab collect   09/30/22 1145   10/01/22 0500  CBC  Daily,   R     Question:  Specimen collection method  Answer:  Lab=Lab collect   09/30/22 1145   09/30/22 0500  Comprehensive metabolic panel  Daily,   R      09/29/22 1045   09/30/22 0500  CBC  Daily,   R      09/29/22 1045   09/29/22 0714  Urinalysis, Routine w reflex microscopic -Urine, Clean Catch  (Undifferentiated presentation (screening labs and basic nursing orders))  ONCE -  URGENT,   URGENT       Question:  Specimen Source  Answer:  Urine, Clean Catch   09/29/22 0714          Data Reviewed: I have personally reviewed following labs and imaging studies CBC: Recent Labs  Lab 09/26/22 0939 09/29/22 0654 09/30/22 0241 10/01/22 0307  WBC 20.9* 9.5 15.5* 9.2  NEUTROABS 18,476* 8.1*  --   --   HGB 15.9 14.8 13.9 13.4  HCT 46.3 42.5 39.9 39.7  MCV 91.7 90.4 91.3 92.1  PLT 301 277 218 258   Basic Metabolic Panel: Recent Labs  Lab 09/26/22 0939 09/29/22 0654 09/30/22 0241 10/01/22 0307  NA 135 134* 134* 134*  K 3.9 3.4* 3.6 3.3*  CL 101 101 97* 99  CO2 GLUCOSE 182* 185* 119* 170*  BUN CREATININE 0.91 0.90 0.80 0.85  CALCIUM 8.9 8.5* 8.7* 8.6*   GFR: Estimated Creatinine Clearance: 78.7 mL/min (by C-G formula based on SCr of 0.85 mg/dL). Liver Function Tests: Recent Labs  Lab 09/26/22 0939 09/29/22 0654 09/30/22 0241 10/01/22 0307  AST ALT <5  ALKPHOS  --  59 62 61  BILITOT 0.8 0.6 0.8 0.6  PROT 8.0 7.4 6.6 6.7  ALBUMIN  --  3.1* 2.7* 2.5*   No results for input(s): "LIPASE", "AMYLASE" in the last 168 hours. No results for input(s): "AMMONIA" in the last 168  hours. Coagulation Profile: Recent Labs  Lab 09/29/22 0654  INR 1.0   BNP (last 3 results) No results for input(s): "PROBNP" in the last 8760 hours. HbA1C: Recent Labs    09/29/22 1926  HGBA1C 6.7*   CBG: Recent Labs  Lab 09/30/22 1421 09/30/22 1603 09/30/22 2107 09/30/22 2226 10/01/22 0759  GLUCAP 115* 123* 235* 219* 185*   Lipid Profile: No results for input(s): "CHOL", "HDL", "LDLCALC", "TRIG", "CHOLHDL", "LDLDIRECT" in the last 72 hours. Thyroid Function Tests: Recent Labs    09/29/22 1224  TSH 0.130*   Sepsis Labs: Recent Labs  Lab 09/29/22 0654 09/29/22 1926  LATICACIDVEN 1.9 1.5    Recent Results (from the past 240 hour(s))  Blood Culture (routine x 2)     Status: None (Preliminary result)   Collection Time: 09/29/22  6:55 AM   Specimen: BLOOD RIGHT FOREARM  Result Value Ref Range Status   Specimen Description BLOOD RIGHT FOREARM  Final   Special Requests   Final    BOTTLES DRAWN AEROBIC AND ANAEROBIC Blood Culture adequate volume   Culture   Final    NO GROWTH 2 DAYS Performed at [facility], [address] [place], [place] [address]    Report Status PENDING  Incomplete  Blood Culture (routine x 2)     Status: None (Preliminary result)   Collection Time: 09/29/22  8:56 AM   Specimen: BLOOD  Result Value Ref Range Status   Specimen Description BLOOD SITE NOT SPECIFIED  Final   Special Requests   Final    BOTTLES DRAWN AEROBIC AND ANAEROBIC Blood Culture results may not be optimal due to an inadequate volume of blood received in culture bottles   Culture   Final    NO GROWTH 2 DAYS Performed at [facility], [address], [place], [place] [address]    Report Status PENDING  Incomplete  Respiratory (~20 pathogens) panel by PCR     Status: None   Collection Time:  09/29/22  5:38 PM   Specimen: Nasopharyngeal Swab; Respiratory  Result Value Ref Range Status   Adenovirus NOT DETECTED NOT DETECTED Final   Coronavirus 229E NOT  DETECTED NOT DETECTED Final    Comment: (NOTE) The Coronavirus on the Respiratory Panel, DOES NOT test for the novel  Coronavirus (2019 nCoV)    Coronavirus HKU1 NOT DETECTED NOT DETECTED Final   Coronavirus NL63 NOT DETECTED NOT DETECTED Final   Coronavirus OC43 NOT DETECTED NOT DETECTED Final   Metapneumovirus NOT DETECTED NOT DETECTED Final   Rhinovirus / Enterovirus NOT DETECTED NOT DETECTED Final   Influenza A NOT DETECTED NOT DETECTED Final   Influenza B NOT DETECTED NOT DETECTED Final   Parainfluenza Virus 1 NOT DETECTED NOT DETECTED Final   Parainfluenza Virus 2 NOT DETECTED NOT DETECTED Final   Parainfluenza Virus 3 NOT DETECTED NOT DETECTED Final   Parainfluenza Virus 4 NOT DETECTED NOT DETECTED Final   Respiratory Syncytial Virus NOT DETECTED NOT DETECTED Final   Bordetella pertussis NOT DETECTED NOT DETECTED Final   Bordetella Parapertussis NOT DETECTED NOT DETECTED Final   Chlamydophila pneumoniae NOT DETECTED NOT DETECTED Final   Mycoplasma pneumoniae NOT DETECTED NOT DETECTED Final    Comment: Performed at [facility], [address] [place], [place] [address]    Antimicrobials: Anti-infectives (From admission, onward)    Start     Dose/Rate Route Frequency Ordered Stop   09/29/22 1430  hydroxychloroquine (PLAQUENIL) tablet 200 mg        200 mg Oral Daily 09/29/22 1421        Culture/Microbiology    Component Value Date/Time   SDES BLOOD SITE NOT SPECIFIED 09/29/2022 0856   SPECREQUEST  09/29/2022 0856    BOTTLES DRAWN AEROBIC AND ANAEROBIC Blood Culture results may not be optimal due to an inadequate volume of blood received in culture bottles   CULT  09/29/2022 0856    NO GROWTH 2 DAYS Performed at [facility], [address], [place], [place] [address]    REPTSTATUS PENDING 09/29/2022 8295  Radiology Studies: CARDIAC CATHETERIZATION  Result Date: 09/30/2022   Ramus-2 lesion is 75% stenosed.   Ramus-1 lesion is 80% stenosed.   Mid LAD  lesion is 75% stenosed.   Ost LAD to Prox LAD lesion is 50% stenosed.   Ost Cx to Prox Cx lesion is 60% stenosed.   Mid LM to Ost LAD lesion is 40% stenosed.   Ost RCA to Prox RCA lesion is 50% stenosed.   RPDA lesion is 75% stenosed.   LV end diastolic pressure is normal.   There is no aortic valve stenosis.   Severe right subclavian tortuosity.  85 cm destination sheath not available today.  Right groin approach used. Moderate disease in the distal left main, ostial LAD and ostial circumflex.  Severe disease in the large ramus and mid LAD.  Will obtain surgical consultation.  I discussed the findings with the wife and specifically asked about the DNR status.  It was meant to be more related to 'he would not want to live on a vent if he had brain damage.' If he was not a candidate for CABG due to other comorbidities, could consider PCI of the mid LAD and medical therapy of the ramus vessel and other moderate disease.   CT Angio Chest PE W and/or Wo Contrast  Result Date: 09/29/2022 CLINICAL DATA:  Pulmonary embolism (PE) suspected, low to intermediate prob, positive D-dimer EXAM: CT ANGIOGRAPHY CHEST WITH CONTRAST  TECHNIQUE: Multidetector CT imaging of the chest was performed using the standard protocol during bolus administration of intravenous contrast. Multiplanar CT image reconstructions and MIPs were obtained to evaluate the vascular anatomy. RADIATION DOSE REDUCTION: This exam was performed according to the departmental dose-optimization program which includes automated exposure control, adjustment of the mA and/or kV according to patient size and/or use of iterative reconstruction technique. CONTRAST:  75mL OMNIPAQUE IOHEXOL 350 MG/ML SOLN COMPARISON:  11/26/2021 FINDINGS: Cardiovascular: Satisfactory opacification of the pulmonary arteries to the segmental level. No evidence of pulmonary embolism. Thoracic aorta is nonaneurysmal. Atherosclerotic vascular calcifications of the aorta and coronary  arteries. Normal heart size. No pericardial effusion. Mediastinum/Nodes: No enlarged mediastinal, hilar, or axillary lymph nodes. Thyroid gland and trachea demonstrate no significant findings. Distal esophagus is mildly thickened. Lungs/Pleura: Passive atelectasis within the dependent lung fields. No focal consolidation. No pleural effusion or pneumothorax. Upper Abdomen: No acute abnormality. Musculoskeletal: No chest wall abnormality. No acute or significant osseous findings. Prior cement augmentation of the T5 vertebral body. Review of the MIP images confirms the above findings. IMPRESSION: 1. No evidence of pulmonary embolism or other acute intrathoracic findings. 2. Mild thickening of the distal esophagus, which can be seen in the setting of esophagitis. 3. Aortic and coronary artery atherosclerosis (ICD10-I70.0). Electronically Signed   By: Duanne Guess D.O.   On: 09/29/2022 09:15     LOS: 1 day   Lanae Boast, MD [facility]  10/01/2022, 8:37 AM

## 2022-10-01 NOTE — Progress Notes (Signed)
Pt having 5/10 chest pain. Refused any nitro because he "doesn't like the way it makes him feel". Pt given tylenol instead. The 2 previous pain episodes were a 3/10 pain and tylenol stopped the pain. Tylenol given with the current pain episode and EKG completed. EKG on pt chart. Will continue to monitor.

## 2022-10-01 NOTE — H&P (View-Only) (Signed)
 Rounding Note    Patient Name: William Hunter Date of Encounter: 10/01/2022   [facility] Cardiologist: Mark Skains, MD   Subjective   He had some chest pain last night and this morning, both episodes relieved with tylenol. No current chest pain, can consider uptitration of medical therapy  Inpatient Medications    Scheduled Meds:  amLODipine  5 mg Oral Daily   aspirin  81 mg Oral Daily   carbidopa-levodopa  1 tablet Oral TID   folic acid  1 mg Oral Daily   hydroxychloroquine  200 mg Oral Daily   insulin aspart  0-5 Units Subcutaneous QHS   insulin aspart  0-9 Units Subcutaneous TID WC   mirabegron ER  25 mg Oral Daily   sodium chloride flush  3 mL Intravenous Q12H   sodium chloride flush  3 mL Intravenous Q12H   Continuous Infusions:  sodium chloride     heparin 1,250 Units/hr (10/01/22 0322)   PRN Meds: sodium chloride, acetaminophen, meclizine, nitroGLYCERIN, ondansetron (ZOFRAN) IV, sodium chloride flush   Vital Signs    Vitals:   09/30/22 1604 09/30/22 1933 10/01/22 0312 10/01/22 0800  BP: 139/85 126/69 (!) 141/80 136/77  Pulse: 89 92 71 74  Resp: 18 16 15 16  Temp: 98.1 F (36.7 C) 98 F (36.7 C) 97.6 F (36.4 C) 98.4 F (36.9 C)  TempSrc: Oral Oral Oral Oral  SpO2: 95% 96% 98% 96%  Weight:      Height:        Intake/Output Summary (Last 24 hours) at 10/01/2022 1112 Last data filed at 10/01/2022 0623 Gross per 24 hour  Intake 757.48 ml  Output 1200 ml  Net -442.52 ml      09/30/2022    4:06 AM 09/29/2022    6:42 AM 09/26/2022    8:08 AM  Last 3 Weights  Weight (lbs) 173 lb 11.6 oz 173 lb 173 lb  Weight (kg) 78.8 kg 78.472 kg 78.472 kg      Telemetry    Sinus rhythm with HR 70 - Personally Reviewed  ECG    No new tracings - Personally Reviewed  Physical Exam   GEN: No acute distress.   Neck: No JVD Cardiac: RRR, no murmurs, rubs, or gallops.  Respiratory: Clear to auscultation bilaterally. GI: Soft, nontender, non-distended   MS: No edema; No deformity. Neuro:  Nonfocal  Psych: Normal affect  Cath site C/D?I  Labs    High Sensitivity Troponin:   Recent Labs  Lab 09/29/22 0654 09/29/22 0914  TROPONINIHS 534* 357*     Chemistry Recent Labs  Lab 09/29/22 0654 09/30/22 0241 10/01/22 0307  NA 134* 134* 134*  K 3.4* 3.6 3.3*  CL 101 97* 99  CO2 24 22 25  GLUCOSE 185* 119* 170*  BUN 21 12 15  CREATININE 0.90 0.80 0.85  CALCIUM 8.5* 8.7* 8.6*  PROT 7.4 6.6 6.7  ALBUMIN 3.1* 2.7* 2.5*  AST 22 17 16  ALT 19 5 <5  ALKPHOS 59 62 61  BILITOT 0.6 0.8 0.6  GFRNONAA >60 >60 >60  ANIONGAP 9 15 10    Lipids No results for input(s): "CHOL", "TRIG", "HDL", "LABVLDL", "LDLCALC", "CHOLHDL" in the last 168 hours.  Hematology Recent Labs  Lab 09/29/22 0654 09/30/22 0241 10/01/22 0307  WBC 9.5 15.5* 9.2  RBC 4.70 4.37 4.31  HGB 14.8 13.9 13.4  HCT 42.5 39.9 39.7  MCV 90.4 91.3 92.1  MCH 31.5 31.8 31.1  MCHC 34.8 34.8 33.8    RDW 13.1 13.1 13.0  PLT 277 218 258   Thyroid  Recent Labs  Lab 09/29/22 1224  TSH 0.130*    BNP Recent Labs  Lab 09/29/22 1926  BNP 195.4*    DDimer  Recent Labs  Lab 09/29/22 0654  DDIMER 0.97*     Radiology    CARDIAC CATHETERIZATION  Result Date: 09/30/2022   Ramus-2 lesion is 75% stenosed.   Ramus-1 lesion is 80% stenosed.   Mid LAD lesion is 75% stenosed.   Ost LAD to Prox LAD lesion is 50% stenosed.   Ost Cx to Prox Cx lesion is 60% stenosed.   Mid LM to Ost LAD lesion is 40% stenosed.   Ost RCA to Prox RCA lesion is 50% stenosed.   RPDA lesion is 75% stenosed.   LV end diastolic pressure is normal.   There is no aortic valve stenosis.   Severe right subclavian tortuosity.  85 cm destination sheath not available today.  Right groin approach used. Moderate disease in the distal left main, ostial LAD and ostial circumflex.  Severe disease in the large ramus and mid LAD.  Will obtain surgical consultation.  I discussed the findings with the wife and specifically  asked about the DNR status.  It was meant to be more related to 'he would not want to live on a vent if he had brain damage.' If he was not a candidate for CABG due to other comorbidities, could consider PCI of the mid LAD and medical therapy of the ramus vessel and other moderate disease.    Cardiac Studies   Echo final read pending  LHC 09/30/22:   Ramus-2 lesion is 75% stenosed.   Ramus-1 lesion is 80% stenosed.   Mid LAD lesion is 75% stenosed.   Ost LAD to Prox LAD lesion is 50% stenosed.   Ost Cx to Prox Cx lesion is 60% stenosed.   Mid LM to Ost LAD lesion is 40% stenosed.   Ost RCA to Prox RCA lesion is 50% stenosed.   RPDA lesion is 75% stenosed.   LV end diastolic pressure is normal.   There is no aortic valve stenosis.   Severe right subclavian tortuosity.  85 cm destination sheath not available today.  Right groin approach used.   Moderate disease in the distal left main, ostial LAD and ostial circumflex.  Severe disease in the large ramus and mid LAD.  Will obtain surgical consultation.  I discussed the findings with the wife and specifically asked about the DNR status.  It was meant to be more related to 'he would not want to live on a vent if he had brain damage.'    If he was not a candidate for CABG due to other comorbidities, could consider PCI of the mid LAD and medical therapy of the ramus vessel and other moderate disease.    Patient Profile     76 y.o. male  with a hx of MI in 1981 with no CAD at cath >> vasospasm, recurrent DVT/PE, DM, HTN, HLD intol statins, aortic atherosclerosis, who is being seen for the evaluation of chest pain and elevated troponin.  Assessment & Plan    NSTEMI with severe multivessel diseae Chest pain, HS troponin 534 --> 357 Echo today Heart cath yesterday wit severe multivessel disease recommended for evaluation by CT surgery for CABG Continue ASA and heparin gtt CT surgery saw yesterday and recommended PCI and medical therapy given  poor mobility - will add imdur given ongoing CP -   15 mg imdur - Plan for PCI of mid LAD lesion tomorrow   Hyperlipidemia with LDL goal < 70 Will update a lipid panel Statin intolerant Will need PCSK9i / lipid clinic referral   Hypertension Continue 5 mg amlodipine   RA Humira, plaquenil at home   Hx of PE/DVT Off eliquis, now on heparin gtt   Disposition Will order PT/OT since his mobility is limited Also, wife is pending surgery tomorrow, no one at home to help him May need rehab      For questions or updates, please contact [facility] Please consult [email or web address] for contact info under        Signed, Angela Nicole Duke, PA  10/01/2022, 11:12 AM     Patient seen and examined.  Agree with above documentation.  On exam, patient is alert and oriented, regular rate and rhythm, no murmurs, lungs CTAB, no LE edema or JVD.  Seen by CT surgery, not felt to be surgical candidate.  PCI recommended.  Dr. Varanasi had recommended PCI of mid LAD if not a CABG candidate.  Will plan for PCI tomorrow.  Redell Nazir L Taisei Bonnette, MD   

## 2022-10-01 NOTE — Progress Notes (Signed)
ANTICOAGULATION CONSULT NOTE  Pharmacy Consult for heparin Indication: chest pain/ACS  Allergies  Allergen Reactions   Codeine Swelling    Other reaction(s): rash   Penicillins Hives and Swelling    Has patient had a PCN reaction causing immediate rash, facial/tongue/throat swelling, SOB or lightheadedness with hypotension: Yes Has patient had a PCN reaction causing severe rash involving mucus membranes or skin necrosis: No Has patient had a PCN reaction that required hospitalization No Has patient had a PCN reaction occurring within the last 10 years: No If all of the above answers are "NO", then may proceed with Cephalosporin use.    Prednisone Hives    Other reaction(s): hives   Wool Alcohol [Lanolin] Hives   Empagliflozin Nausea And Vomiting    Other reaction(s): nausea and vomiting   Penicillin G     Other reaction(s): rash   Statins     Other reaction(s): myopathy    Patient Measurements: Height:  (177.8 cm) Weight: 78.8 kg (173 lb 11.6 oz) IBW/kg (Calculated) : 73 Heparin Dosing Weight: TBW  Vital Signs: Temp: 98.4 F (36.9 C) (04/23 0800) Temp Source: Oral (04/23 0800) BP: 136/77 (04/23 0800) Pulse Rate: 74 (04/23 0800)  Labs: Recent Labs    09/29/22 0654 09/29/22 0914 09/29/22 1800 09/29/22 2150 09/30/22 0241 10/01/22 0307 10/01/22 1028  HGB 14.8  --   --   --  13.9 13.4  --   HCT 42.5  --   --   --  39.9 39.7  --   PLT 277  --   --   --  218 258  --   APTT 26  --   --  66*  --   --   --   LABPROT 13.3  --   --   --   --   --   --   INR 1.0  --   --   --   --   --   --   HEPARINUNFRC  --   --  <0.10*  --  0.20*  --  0.16*  CREATININE 0.90  --   --   --  0.80 0.85  --   TROPONINIHS 534* 357*  --   --   --   --   --      Estimated Creatinine Clearance: 78.7 mL/min (by C-G formula based on SCr of 0.85 mg/dL).   Medical History: Past Medical History:  Diagnosis Date   Anginal pain    Arthritis    "fingers" (03/09/2012)   Cold feet     "from my diabetes; they stay cold" (03/09/2012)   Coronary artery disease    Coronary artery spasm    since age 34 Dr Aleen Campi   COVID 07/06/2019   hospitalizied   Dyslipidemia    Femoral DVT (deep venous thrombosis)    left   Headache(784.0)    Hx of gallstones    Dr Derrell Lolling lap cholecystectomy   Hx of plastic surgery    plastic surgery following a fall off a loading dock ,lost  all his upper teeth    Hypertension    MI, old    at age 42   Migraines    Pneumonia ~ 1962; 1970's   "double"; "regular" (03/09/2012)   Rheumatoid arthritis    Dr Dierdre Forth    Slipped cervical disc 06/10/2006   Dr Patsi Sears in the past/ Dr Larita Fife , chiropractor in 2008   Thyroid disease    Dr Lucianne Muss   Type  II diabetes mellitus     Medications:  Facility-Administered Medications Prior to Admission  Medication Dose Route Frequency Provider Last Rate Last Admin   Study - ORION 4 - inclisiran 300 mg/1.13mL or placebo SQ injection (PI-Stuckey)  300 mg Subcutaneous Q6 months Herby Abraham, MD       Medications Prior to Admission  Medication Sig Dispense Refill Last Dose   apixaban (ELIQUIS) 5 MG TABS tablet Take 10 mg p.o. twice daily, on  Wed 12/05/21 at 1000-switch to 5 mg p.o. twice daily. 60 tablet  Past Month   carbidopa-levodopa (SINEMET IR) 25-100 MG tablet Take 1 tablet by mouth 3 (three) times daily. 90 tablet 6 09/28/2022   folic acid (FOLVITE) 1 MG tablet Take 1 tablet (1 mg total) by mouth daily. 90 tablet 3 09/28/2022   GVOKE HYPOPEN 2-PACK 1 MG/0.2ML SOAJ Inject into the skin.      hydroxychloroquine (PLAQUENIL) 200 MG tablet Take 1 tablet (200 mg total) by mouth daily. 90 tablet 0 09/28/2022   insulin aspart (NOVOLOG) 100 UNIT/ML injection 0-5 Units, Subcutaneous, Daily at bedtime: HS scale CBG < 70: implement hypoglycemia measures CBG 70 - 120: 0 units CBG 121 - 150: 0 units CBG 151 - 200: 0 units CBG 201 - 250: 2 units CBG 251 - 300: 3 units CBG 301 - 350: 4 units CBG 351 - 400: 5 units CBG >  400: call MD and obtain STAT lab verification 10 mL 11 09/28/2022   insulin aspart (NOVOLOG) 100 UNIT/ML injection 0-15 Units, Subcutaneous, 3 times daily with meals CBG < 70: Implement Hypoglycemia Standing Orders and refer to Hypoglycemia Standing Orders sidebar report CBG 70 - 120: 0 units CBG 121 - 150: 2 units CBG 151 - 200: 3 units CBG 201 - 250: 5 units CBG 251 - 300: 8 units CBG 301 - 350: 11 units CBG 351 - 400: 15 units CBG > 400: call MD and obtain STAT lab verification 10 mL 11 09/28/2022   meclizine (ANTIVERT) 25 MG tablet Take 25 mg by mouth 3 (three) times daily as needed for dizziness.      methotrexate (RHEUMATREX) 2.5 MG tablet Take 6 tablets (15 mg total) by mouth once a week. Caution:Chemotherapy. Protect from light. 72 tablet 0 Past Week   MYRBETRIQ 25 MG TB24 tablet Take 25 mg by mouth daily.   09/28/2022   BD PEN NEEDLE MICRO U/F 32G X 6 MM MISC Inject into the skin 3 (three) times daily.      BD PEN NEEDLE NANO 2ND GEN 32G X 4 MM MISC       Continuous Glucose Sensor (FREESTYLE LIBRE 2 SENSOR) MISC Place onto the skin.      diltiazem (TIAZAC) 300 MG 24 hr capsule Take 1 capsule (300 mg total) by mouth daily. (Patient not taking: Reported on 09/26/2022) 90 capsule 3    Study - ORION 4 - inclisiran 300 mg/1.70mL or placebo SQ injection (PI-Stuckey) Inject 1.5 mLs (300 mg total) into the skin every 6 (six) months. (Patient not taking: Reported on 09/26/2022) 1 mL 1     Assessment: 76 YOM presenting with CP, elevated troponin, CBC wnl, has been on Eliquis for hx of PE however has run out of this and states he has not taken in at least 3 weeks. Heparin was stopped prior to cath 4/22. Pharmacy consulted to restart.  Initial heparin level subtherapeutic at 0.16, CBC wnl this am, no reported bleeding or infusion issues.  Goal of Therapy:  Heparin level 0.3-0.7 units/ml Monitor platelets by anticoagulation protocol: Yes   Plan:  Increase heparin to 1500 units/h Recheck heparin level in  8h  Fredonia Highland, PharmD, [place], [facility] Clinical Pharmacist [phone] Please check AMION for all [facility] Pharmacy numbers 10/01/2022

## 2022-10-01 NOTE — Evaluation (Signed)
Physical Therapy Evaluation Patient Details Name: William Hunter MRN: [id number] DOB: June 08, 1946 Today's Date: 10/01/2022  History of Present Illness  Pt is 77 year old presented [facility] on  09/29/22 for chest pain. Pt with NSTEMI. Cardiac cath 4/22 showed severe multivessel CAD. Pt not surgical candidate. Plans for stent on 10/02/22. PMH - OA, CAD, HTN, RA, DMII, DVT/PE  Clinical Impression  Pt admitted with above diagnosis and presents to PT with functional limitations due to deficits listed below (See PT problem list). Pt needs skilled PT to maximize independence and safety. Pt currently requiring significant assist for all mobility and wife having surgery and unavailable to assist. Patient will benefit from continued post acute follow up therapy, <3 hours/day          Recommendations for follow up therapy are one component of a multi-disciplinary discharge planning process, led by the attending physician.  Recommendations may be updated based on patient status, additional functional criteria and insurance authorization.  Follow Up Recommendations Can patient physically be transported by private vehicle: No     Assistance Recommended at Discharge Frequent or constant Supervision/Assistance  Patient can return home with the following  Two people to help with walking and/or transfers;A lot of help with bathing/dressing/bathroom;Assist for transportation;Assistance with cooking/housework;Help with stairs or ramp for entrance    Equipment Recommendations None recommended by PT  Recommendations for Other Services       Functional Status Assessment Patient has had a recent decline in their functional status and demonstrates the ability to make significant improvements in function in a reasonable and predictable amount of time.     Precautions / Restrictions Precautions Precautions: Fall Restrictions Weight Bearing Restrictions: No      Mobility  Bed Mobility Overal bed mobility:  Needs Assistance Bed Mobility: Supine to Sit, Sit to Supine     Supine to sit: Max assist Sit to supine: Max assist   General bed mobility comments: Assist to bring legs off of bed, elevate trunk into sitting and bring hips to EOB. Assist to lower trunk and bring legs back up into bed    Transfers Overall transfer level: Needs assistance Equipment used: Ambulation equipment used Transfers: Sit to/from Stand Sit to Stand: Mod assist, From elevated surface           General transfer comment: Used Stedy to stand from EOB. Mod assist to power up and for balance. Pt unable to fully extend hips and trunk.    Ambulation/Gait                  Stairs            Wheelchair Mobility    Modified Rankin (Stroke Patients Only)       Balance Overall balance assessment: Needs assistance Sitting-balance support: Bilateral upper extremity supported, Feet supported Sitting balance-Leahy Scale: Poor Sitting balance - Comments: Min assist for static sitting EOB Postural control: Posterior lean Standing balance support: Bilateral upper extremity supported, During functional activity, Reliant on assistive device for balance Standing balance-Leahy Scale: Poor Standing balance comment: Mod assist for standing with Stedy x 30-40 secs. Pt incontinent of stool so standing for pericare                             Pertinent Vitals/Pain Pain Assessment Pain Assessment: Faces Faces Pain Scale: No hurt    Home Living Family/patient expects to be discharged to:: Private residence Living Arrangements: Spouse/significant other  Available Help at Discharge: Other (Comment) (wife is having surgery very soon and won't be available to help) Type of Home: House Home Access: Stairs to enter Entrance Stairs-Rails: None Entrance Stairs-Number of Steps: 1   Home Layout: One level Home Equipment: Agricultural consultant (2 wheels);Cane - single point;Wheelchair - manual;Toilet riser;Grab  bars - tub/shower;Grab bars - toilet      Prior Function Prior Level of Function : Needs assist             Mobility Comments: Pt reports he was amb with RW. Needed assist some of the time and at other times able to amb modified independent       Hand Dominance   Dominant Hand: Left    Extremity/Trunk Assessment   Upper Extremity Assessment Upper Extremity Assessment: Defer to OT evaluation    Lower Extremity Assessment Lower Extremity Assessment: Generalized weakness       Communication   Communication: No difficulties  Cognition Arousal/Alertness: Awake/alert Behavior During Therapy: Flat affect Overall Cognitive Status: Within Functional Limits for tasks assessed                                          General Comments General comments (skin integrity, edema, etc.): Pt reported dizziness several times while up. BP 120's/80's.    Exercises     Assessment/Plan    PT Assessment Patient needs continued PT services  PT Problem List Decreased strength;Decreased activity tolerance;Decreased balance;Decreased mobility       PT Treatment Interventions DME instruction;Gait training;Functional mobility training;Therapeutic activities;Therapeutic exercise;Balance training;Patient/family education    PT Goals (Current goals can be found in the Care Plan section)  Acute Rehab PT Goals Patient Stated Goal: go to rehab PT Goal Formulation: With patient Time For Goal Achievement: 10/15/22 Potential to Achieve Goals: Fair    Frequency Min 1X/week     Co-evaluation               AM-PAC PT "6 Clicks" Mobility  Outcome Measure Help needed turning from your back to your side while in a flat bed without using bedrails?: A Lot Help needed moving from lying on your back to sitting on the side of a flat bed without using bedrails?: A Lot Help needed moving to and from a bed to a chair (including a wheelchair)?: Total Help needed standing up from  a chair using your arms (e.g., wheelchair or bedside chair)?: A Lot Help needed to walk in hospital room?: Total Help needed climbing 3-5 steps with a railing? : Total 6 Click Score: 9    End of Session Equipment Utilized During Treatment: Gait belt Activity Tolerance: Patient limited by fatigue Patient left: in bed;with call bell/phone within reach;with bed alarm set Nurse Communication: Mobility status;Need for lift equipment PT Visit Diagnosis: Other abnormalities of gait and mobility (R26.89);Unsteadiness on feet (R26.81);Muscle weakness (generalized) (M62.81);History of falling (Z91.81)    Time: 1357-1430 PT Time Calculation (min) (ACUTE ONLY): 33 min   Charges:   PT Evaluation $PT Eval Moderate Complexity: 1 Mod PT Treatments $Therapeutic Activity: 8-22 mins        [facility] PT Acute Rehabilitation Services Office [phone]   Angelina Ok [facility] 10/01/2022, 4:58 PM

## 2022-10-02 ENCOUNTER — Other Ambulatory Visit (HOSPITAL_COMMUNITY): Payer: Self-pay

## 2022-10-02 ENCOUNTER — Inpatient Hospital Stay (HOSPITAL_COMMUNITY)
Admission: EM | Disposition: A | Discharge status: Skilled Nursing Facility | Payer: Self-pay | Source: Home / Self Care | Attending: Internal Medicine

## 2022-10-02 DIAGNOSIS — E785 Hyperlipidemia, unspecified: Secondary | ICD-10-CM | POA: Diagnosis not present

## 2022-10-02 DIAGNOSIS — I2510 Atherosclerotic heart disease of native coronary artery without angina pectoris: Secondary | ICD-10-CM | POA: Diagnosis not present

## 2022-10-02 DIAGNOSIS — I214 Non-ST elevation (NSTEMI) myocardial infarction: Secondary | ICD-10-CM | POA: Diagnosis not present

## 2022-10-02 DIAGNOSIS — I10 Essential (primary) hypertension: Secondary | ICD-10-CM | POA: Diagnosis not present

## 2022-10-02 HISTORY — PX: CORONARY ULTRASOUND/IVUS: CATH118244

## 2022-10-02 HISTORY — PX: CORONARY STENT INTERVENTION: CATH118234

## 2022-10-02 LAB — COMPREHENSIVE METABOLIC PANEL
ALT: 7 U/L (ref 0–44)
AST: 14 U/L — ABNORMAL LOW (ref 15–41)
Albumin: 2.5 g/dL — ABNORMAL LOW (ref 3.5–5.0)
Alkaline Phosphatase: 58 U/L (ref 38–126)
Anion gap: 8 (ref 5–15)
BUN: 20 mg/dL (ref 8–23)
CO2: 23 mmol/L (ref 22–32)
Calcium: 8.2 mg/dL — ABNORMAL LOW (ref 8.9–10.3)
Chloride: 102 mmol/L (ref 98–111)
Creatinine, Ser: 0.81 mg/dL (ref 0.61–1.24)
GFR, Estimated: >60 mL/min (ref >60)
Glucose, Bld: 174 mg/dL — ABNORMAL HIGH (ref 70–99)
Potassium: 3.7 mmol/L (ref 3.5–5.1)
Sodium: 133 mmol/L — ABNORMAL LOW (ref 135–145)
Total Bilirubin: 0.6 mg/dL (ref 0.3–1.2)
Total Protein: 6.5 g/dL (ref 6.5–8.1)

## 2022-10-02 LAB — CBC
HCT: 36.3 % — ABNORMAL LOW (ref 39.0–52.0)
Hemoglobin: 12.9 g/dL — ABNORMAL LOW (ref 13.0–17.0)
MCH: 31.8 pg (ref 26.0–34.0)
MCHC: 35.5 g/dL (ref 30.0–36.0)
MCV: 89.4 fL (ref 80.0–100.0)
Platelets: 247 10*3/uL (ref 150–400)
RBC: 4.06 MIL/uL — ABNORMAL LOW (ref 4.22–5.81)
RDW: 13 % (ref 11.5–15.5)
WBC: 8.6 10*3/uL (ref 4.0–10.5)
nRBC: 0 % (ref 0.0–0.2)

## 2022-10-02 LAB — POCT ACTIVATED CLOTTING TIME
Activated Clotting Time: 304 seconds
Activated Clotting Time: 266 seconds

## 2022-10-02 LAB — CULTURE, BLOOD (ROUTINE X 2): Special Requests: ADEQUATE

## 2022-10-02 LAB — GLUCOSE, CAPILLARY
Glucose-Capillary: 186 mg/dL — ABNORMAL HIGH (ref 70–99)
Glucose-Capillary: 147 mg/dL — ABNORMAL HIGH (ref 70–99)
Glucose-Capillary: 198 mg/dL — ABNORMAL HIGH (ref 70–99)
Glucose-Capillary: 177 mg/dL — ABNORMAL HIGH (ref 70–99)

## 2022-10-02 LAB — HEPARIN LEVEL (UNFRACTIONATED): Heparin Unfractionated: 0.47 IU/mL (ref 0.30–0.70)

## 2022-10-02 SURGERY — CORONARY STENT INTERVENTION
Anesthesia: LOCAL

## 2022-10-02 MED ORDER — SODIUM CHLORIDE 0.9 % WEIGHT BASED INFUSION
3.0000 mL/kg/h | INTRAVENOUS | Status: DC
  Administered 2022-10-02: 3 mL/kg/h via INTRAVENOUS

## 2022-10-02 MED ORDER — LIDOCAINE HCL (PF) 1 % IJ SOLN
INTRAMUSCULAR | Status: AC
  Filled 2022-10-02: qty 30

## 2022-10-02 MED ORDER — SODIUM CHLORIDE 0.9 % WEIGHT BASED INFUSION
1.0000 mL/kg/h | INTRAVENOUS | Status: DC
  Administered 2022-10-02: 1 mL/kg/h via INTRAVENOUS

## 2022-10-02 MED ORDER — ACETAMINOPHEN 325 MG PO TABS: 650.0000 mg | ORAL_TABLET | ORAL | Status: DC | PRN

## 2022-10-02 MED ORDER — SODIUM CHLORIDE 0.9 % WEIGHT BASED INFUSION
1.0000 mL/kg/h | INTRAVENOUS | Status: DC
Start: 2022-10-03 — End: 2022-10-02

## 2022-10-02 MED ORDER — ASPIRIN 81 MG PO TBEC
81.0000 mg | DELAYED_RELEASE_TABLET | Freq: Every day | ORAL | Status: DC
  Administered 2022-10-03: 81 mg via ORAL
  Filled 2022-10-02: qty 1

## 2022-10-02 MED ORDER — ONDANSETRON HCL 4 MG/2ML IJ SOLN: 4.0000 mg | Freq: Four times a day (QID) | INTRAMUSCULAR | Status: DC | PRN

## 2022-10-02 MED ORDER — MIDAZOLAM HCL 2 MG/2ML IJ SOLN
INTRAMUSCULAR | Status: DC | PRN
  Administered 2022-10-02: 2 mg via INTRAVENOUS

## 2022-10-02 MED ORDER — LABETALOL HCL 5 MG/ML IV SOLN: 10.0000 mg | INTRAVENOUS | Status: AC | PRN

## 2022-10-02 MED ORDER — VERAPAMIL HCL 2.5 MG/ML IV SOLN
INTRAVENOUS | Status: DC | PRN
  Administered 2022-10-02 (×2): 10 mL via INTRA_ARTERIAL

## 2022-10-02 MED ORDER — SODIUM CHLORIDE 0.9% FLUSH: 3.0000 mL | INTRAVENOUS | Status: DC | PRN

## 2022-10-02 MED ORDER — HEPARIN (PORCINE) IN NACL 1000-0.9 UT/500ML-% IV SOLN
INTRAVENOUS | Status: DC | PRN
  Administered 2022-10-02 (×2): 500 mL via INTRA_ARTERIAL

## 2022-10-02 MED ORDER — SODIUM CHLORIDE 0.9 % IV SOLN: 250.0000 mL | INTRAVENOUS | Status: DC | PRN

## 2022-10-02 MED ORDER — SODIUM CHLORIDE 0.9 % IV SOLN: INTRAVENOUS | Status: AC

## 2022-10-02 MED ORDER — CLOPIDOGREL BISULFATE 75 MG PO TABS
600.0000 mg | ORAL_TABLET | Freq: Once | ORAL | Status: AC
  Administered 2022-10-02: 600 mg via ORAL
  Filled 2022-10-02 (×2): qty 8

## 2022-10-02 MED ORDER — HYDRALAZINE HCL 20 MG/ML IJ SOLN: 10.0000 mg | INTRAMUSCULAR | Status: AC | PRN

## 2022-10-02 MED ORDER — SODIUM CHLORIDE 0.9 % WEIGHT BASED INFUSION
3.0000 mL/kg/h | INTRAVENOUS | Status: DC
Start: 2022-10-03 — End: 2022-10-02

## 2022-10-02 MED ORDER — LIDOCAINE HCL (PF) 1 % IJ SOLN
INTRAMUSCULAR | Status: DC | PRN
  Administered 2022-10-02: 2 mL

## 2022-10-02 MED ORDER — IOHEXOL 350 MG/ML SOLN
INTRAVENOUS | Status: DC | PRN
  Administered 2022-10-02: 75 mL

## 2022-10-02 MED ORDER — ASPIRIN 81 MG PO CHEW
81.0000 mg | CHEWABLE_TABLET | ORAL | Status: AC
  Administered 2022-10-02: 81 mg via ORAL

## 2022-10-02 MED ORDER — CLOPIDOGREL BISULFATE 75 MG PO TABS
75.0000 mg | ORAL_TABLET | Freq: Every day | ORAL | Status: DC
  Administered 2022-10-03: 75 mg via ORAL
  Filled 2022-10-02: qty 1

## 2022-10-02 MED ORDER — SODIUM CHLORIDE 0.9% FLUSH
3.0000 mL | Freq: Two times a day (BID) | INTRAVENOUS | Status: DC
  Administered 2022-10-02 – 2022-10-03 (×2): 3 mL via INTRAVENOUS

## 2022-10-02 MED ORDER — MIDAZOLAM HCL 2 MG/2ML IJ SOLN
INTRAMUSCULAR | Status: AC
  Filled 2022-10-02: qty 2

## 2022-10-02 MED ORDER — FENTANYL CITRATE (PF) 100 MCG/2ML IJ SOLN
INTRAMUSCULAR | Status: AC
  Filled 2022-10-02: qty 2

## 2022-10-02 MED ORDER — NITROGLYCERIN 1 MG/10 ML FOR IR/CATH LAB
INTRA_ARTERIAL | Status: AC
  Filled 2022-10-02: qty 10

## 2022-10-02 MED ORDER — HEPARIN SODIUM (PORCINE) 1000 UNIT/ML IJ SOLN
INTRAMUSCULAR | Status: AC
  Filled 2022-10-02: qty 10

## 2022-10-02 MED ORDER — HEPARIN SODIUM (PORCINE) 1000 UNIT/ML IJ SOLN
INTRAMUSCULAR | Status: DC | PRN
  Administered 2022-10-02: 9000 [IU] via INTRAVENOUS
  Administered 2022-10-02: 2000 [IU] via INTRAVENOUS

## 2022-10-02 MED ORDER — FENTANYL CITRATE (PF) 100 MCG/2ML IJ SOLN
INTRAMUSCULAR | Status: DC | PRN
  Administered 2022-10-02: 25 ug via INTRAVENOUS

## 2022-10-02 MED ORDER — ASPIRIN 81 MG PO CHEW: 81.0000 mg | CHEWABLE_TABLET | ORAL | Status: DC

## 2022-10-02 MED ORDER — VERAPAMIL HCL 2.5 MG/ML IV SOLN
INTRAVENOUS | Status: AC
  Filled 2022-10-02: qty 2

## 2022-10-02 SURGICAL SUPPLY — 23 items
BALL SAPPHIRE NC24 3.75X12 (BALLOONS) ×1
BALLN EMERGE MR 2.5X15 (BALLOONS) ×1
BALLOON EMERGE MR 2.5X15 (BALLOONS) IMPLANT
BALLOON SAPPHIRE NC24 3.75X12 (BALLOONS) IMPLANT
CATH INFINITI JR4 5F (CATHETERS) IMPLANT
CATH LAUNCHER 6FR EBU3.5 (CATHETERS) IMPLANT
CATH OPTICROSS HD (CATHETERS) IMPLANT
DEVICE RAD COMP TR BAND LRG (VASCULAR PRODUCTS) IMPLANT
ELECT DEFIB PAD ADLT CADENCE (PAD) IMPLANT
GLIDESHEATH SLEND SS 6F .021 (SHEATH) IMPLANT
GUIDEWIRE INQWIRE 1.5J.035X260 (WIRE) IMPLANT
INQWIRE 1.5J .035X260CM (WIRE) ×1
KIT ENCORE 26 ADVANTAGE (KITS) IMPLANT
KIT HEART LEFT (KITS) ×2 IMPLANT
KIT HEMO VALVE WATCHDOG (MISCELLANEOUS) IMPLANT
PACK CARDIAC CATHETERIZATION (CUSTOM PROCEDURE TRAY) ×2 IMPLANT
SHEATH PROBE COVER 6X72 (BAG) IMPLANT
SLED PULL BACK IVUS (MISCELLANEOUS) IMPLANT
STENT SYNERGY XD 3.50X38 (Permanent Stent) IMPLANT
SYNERGY XD 3.50X38 (Permanent Stent) ×1 IMPLANT
TRANSDUCER W/STOPCOCK (MISCELLANEOUS) ×2 IMPLANT
TUBING CIL FLEX 10 FLL-RA (TUBING) ×2 IMPLANT
WIRE RUNTHROUGH .014X180CM (WIRE) IMPLANT

## 2022-10-02 NOTE — [place] FL2 (Signed)
[place] MEDICAID FL2 LEVEL OF CARE FORM     IDENTIFICATION  Patient Name: William Hunter Birthdate: 1945/06/13 Sex: male Admission Date (Current Location): 09/29/2022  [facility] and [place] Number:  Producer, television/film/video and Address:  [facility] [place], [address], [place], [place] [address]      Provider Number: [id number]  Attending Physician Name and Address:  Lanae Boast, MD  Relative Name and Phone Number:  Talbert Forest (Spouse) [phone]    Current Level of Care: Hospital Recommended Level of Care: Skilled Nursing Facility Prior Approval Number:    Date Approved/Denied:   PASRR Number: [id number]  Discharge Plan: SNF    Current Diagnoses: Patient Active Problem List   Diagnosis Date Noted   Elevated troponin 09/30/2022   Chest pain 09/29/2022   High risk medication use 09/26/2022   Neuropathy 04/10/2022   Abnormal finding on MRI of brain 04/10/2022   Gait abnormality 01/31/2022   Aortic atherosclerosis 12/09/2021   History of pulmonary embolism 12/09/2021   Pure hypercholesterolemia 12/09/2021   Bilateral leg weakness 11/25/2021   Leg weakness, bilateral 11/25/2021   Lower extremity weakness 11/25/2021   Statin myopathy 02/22/2020   Acute respiratory failure with hypoxia 07/03/2019   Acute respiratory failure due to COVID-19 07/03/2019   Migraine with aura and without status migrainosus, not intractable 09/02/2016   Diabetic polyneuropathy associated with type 2 diabetes mellitus 09/02/2016   Rheumatoid arthritis 09/02/2016   Coronary vasospasm ([name] 04/06/2014   Gallstones 03/10/2012   Abdominal pain 03/09/2012   Non-ST elevation (NSTEMI) myocardial infarction    Coronary artery disease    Hypertension    Dyslipidemia     Orientation RESPIRATION BLADDER Height & Weight     Self, Time, Situation, Place (WDL)  Normal Incontinent Weight: 180 lb 12.4 oz (82 kg) Height:   (177.8 cm)  BEHAVIORAL SYMPTOMS/MOOD NEUROLOGICAL BOWEL  NUTRITION STATUS      Incontinent Diet (Please see discharge summary)  AMBULATORY STATUS COMMUNICATION OF NEEDS Skin   Extensive Assist Verbally Other (Comment) (Ecchymosis,arm,Bil.,Wound/Incision LDAs)                       Personal Care Assistance Level of Assistance  Bathing, Feeding, Dressing Bathing Assistance: Maximum assistance Feeding assistance: Independent Dressing Assistance: Maximum assistance     Functional Limitations Info  Sight, Hearing, Speech Sight Info:  (WDL) Hearing Info: Adequate Speech Info: Adequate    SPECIAL CARE FACTORS FREQUENCY  PT (By licensed PT), OT (By licensed OT)     PT Frequency: 5x min weekly OT Frequency: 5x min weekly            Contractures Contractures Info: Not present    Additional Factors Info  Code Status, Allergies, Insulin Sliding Scale Code Status Info: DNR Allergies Info: Codeine,Penicillins,Prednisone,Wool Alcohol (lanolin),Empagliflozin,Penicillin G,Statins   Insulin Sliding Scale Info: insulin aspart (novoLOG) injection 0-5 Units daily at bedtime,insulin aspart (novoLOG) injection 0-9 Units 3 times daily with meals       Current Medications (10/02/2022):  This is the current hospital active medication list Current Facility-Administered Medications  Medication Dose Route Frequency Provider Last Rate Last Admin   0.9 %  sodium chloride infusion  250 mL Intravenous PRN Corky Crafts, MD       0.9 %  sodium chloride infusion   Intravenous Continuous Corky Crafts, MD 100 mL/hr at 10/02/22 1328 New Bag at 10/02/22 1328   0.9 %  sodium chloride infusion  250  mL Intravenous PRN Corky Crafts, MD       acetaminophen (TYLENOL) tablet 650 mg  650 mg Oral Q4H PRN Corky Crafts, MD   650 mg at 10/01/22 1834   amLODipine (NORVASC) tablet 5 mg  5 mg Oral Daily Corky Crafts, MD   5 mg at 10/02/22 0826   [START ON 10/03/2022] aspirin EC tablet 81 mg  81 mg Oral Daily Corky Crafts, MD        carbidopa-levodopa (SINEMET IR) 25-100 MG per tablet immediate release 1 tablet  1 tablet Oral TID Corky Crafts, MD   1 tablet at 10/02/22 1326   [START ON 10/03/2022] clopidogrel (PLAVIX) tablet 75 mg  75 mg Oral Q breakfast Corky Crafts, MD       folic acid (FOLVITE) tablet 1 mg  1 mg Oral Daily Corky Crafts, MD   1 mg at 10/02/22 7829   hydrALAZINE (APRESOLINE) injection 10 mg  10 mg Intravenous Q20 Min PRN Corky Crafts, MD       hydroxychloroquine (PLAQUENIL) tablet 200 mg  200 mg Oral Daily Corky Crafts, MD   200 mg at 10/02/22 0826   insulin aspart (novoLOG) injection 0-5 Units  0-5 Units Subcutaneous QHS Corky Crafts, MD   2 Units at 10/01/22 2200   insulin aspart (novoLOG) injection 0-9 Units  0-9 Units Subcutaneous TID WC Corky Crafts, MD   1 Units at 10/02/22 1326   isosorbide mononitrate (IMDUR) 24 hr tablet 15 mg  15 mg Oral Daily Corky Crafts, MD   15 mg at 10/02/22 5621   labetalol (NORMODYNE) injection 10 mg  10 mg Intravenous Q10 min PRN Corky Crafts, MD       meclizine (ANTIVERT) tablet 25 mg  25 mg Oral TID PRN Corky Crafts, MD       mirabegron ER [facility]) tablet 25 mg  25 mg Oral Daily Corky Crafts, MD   25 mg at 10/02/22 0825   nitroGLYCERIN (NITROSTAT) SL tablet 0.4 mg  0.4 mg Sublingual Q5 Min x 3 PRN Corky Crafts, MD       ondansetron [facility]) injection 4 mg  4 mg Intravenous Q6H PRN Corky Crafts, MD       sodium chloride flush (NS) 0.9 % injection 3 mL  3 mL Intravenous Q12H Lance Muss S, MD   3 mL at 10/01/22 2200   sodium chloride flush (NS) 0.9 % injection 3 mL  3 mL Intravenous Q12H Corky Crafts, MD   3 mL at 10/02/22 0829   sodium chloride flush (NS) 0.9 % injection 3 mL  3 mL Intravenous PRN Corky Crafts, MD       sodium chloride flush (NS) 0.9 % injection 3 mL  3 mL Intravenous Q12H Corky Crafts, MD   3 mL at 10/02/22 0828    sodium chloride flush (NS) 0.9 % injection 3 mL  3 mL Intravenous Q12H Corky Crafts, MD       sodium chloride flush (NS) 0.9 % injection 3 mL  3 mL Intravenous PRN Corky Crafts, MD         Discharge Medications: Please see discharge summary for a list of discharge medications.  Relevant Imaging Results:  Relevant Lab Results:   Additional Information [id number]  Delilah Shan, LCSWA

## 2022-10-02 NOTE — Progress Notes (Addendum)
Rounding Note    Patient Name: William Hunter Date of Encounter: 10/02/2022  [facility] Cardiologist: Donato Schultz, MD   Subjective   No chest pain today, ready for heart cath  Inpatient Medications    Scheduled Meds:  amLODipine  5 mg Oral Daily   aspirin  81 mg Oral Daily   carbidopa-levodopa  1 tablet Oral TID   folic acid  1 mg Oral Daily   hydroxychloroquine  200 mg Oral Daily   insulin aspart  0-5 Units Subcutaneous QHS   insulin aspart  0-9 Units Subcutaneous TID WC   isosorbide mononitrate  15 mg Oral Daily   mirabegron ER  25 mg Oral Daily   sodium chloride flush  3 mL Intravenous Q12H   sodium chloride flush  3 mL Intravenous Q12H   sodium chloride flush  3 mL Intravenous Q12H   Continuous Infusions:  sodium chloride     sodium chloride     sodium chloride 1 mL/kg/hr (10/02/22 0635)   heparin 1,700 Units/hr (10/01/22 2148)   PRN Meds: sodium chloride, sodium chloride, acetaminophen, meclizine, nitroGLYCERIN, ondansetron (ZOFRAN) IV, sodium chloride flush, sodium chloride flush   Vital Signs    Vitals:   10/01/22 0800 10/01/22 1933 10/02/22 0546 10/02/22 0746  BP: 136/77 114/66 127/78 138/78  Pulse: 74 80 77 80  Resp: Temp: 98.4 F (36.9 C) 98.7 F (37.1 C) 98.1 F (36.7 C) 98 F (36.7 C)  TempSrc: Oral Oral Oral Oral  SpO2: 96% 96% 97% 98%  Weight:   82 kg   Height:        Intake/Output Summary (Last 24 hours) at 10/02/2022 1021 Last data filed at 10/02/2022 0500 Gross per 24 hour  Intake 120.61 ml  Output 850 ml  Net -729.39 ml      10/02/2022    5:46 AM 09/30/2022    4:06 AM 09/29/2022    6:42 AM  Last 3 Weights  Weight (lbs) 180 lb 12.4 oz 173 lb 11.6 oz 173 lb  Weight (kg) 82 kg 78.8 kg 78.472 kg      Telemetry    Sinus rhythm 70s with a burst of ?atrial tachycardia - Personally Reviewed  ECG    No new tracings - Personally Reviewed  Physical Exam   GEN: No acute distress.   Neck: No JVD Cardiac:  RRR, no murmurs, rubs, or gallops.  Respiratory: Clear to auscultation bilaterally. GI: Soft, nontender, non-distended  MS: No edema; No deformity. Neuro:  Nonfocal  Psych: Normal affect  Right radial cath site C/D/I  Labs    High Sensitivity Troponin:   Recent Labs  Lab 09/29/22 0654 09/29/22 0914  TROPONINIHS 534* 357*     Chemistry Recent Labs  Lab 09/30/22 0241 10/01/22 0307 10/02/22 0218  NA 134* 134* 133*  K 3.6 3.3* 3.7  CL 97* 99 102  CO2 GLUCOSE 119* 170* 174*  BUN CREATININE 0.80 0.85 0.81  CALCIUM 8.7* 8.6* 8.2*  PROT 6.6 6.7 6.5  ALBUMIN 2.7* 2.5* 2.5*  AST 17 16 14*  ALT 5 <5 7  ALKPHOS 62 61 58  BILITOT 0.8 0.6 0.6  GFRNONAA >60 >60 >60  ANIONGAP Lipids No results for input(s): "CHOL", "TRIG", "HDL", "LABVLDL", "LDLCALC", "CHOLHDL" in the last 168 hours.  Hematology Recent Labs  Lab 09/30/22 0241 10/01/22 0307 10/02/22 0218  WBC 15.5* 9.2 8.6  RBC  4.37 4.31 4.06*  HGB 13.9 13.4 12.9*  HCT 39.9 39.7 36.3*  MCV 91.3 92.1 89.4  MCH 31.8 31.1 31.8  MCHC 34.8 33.8 35.5  RDW 13.1 13.0 13.0  PLT 218 258 247   Thyroid  Recent Labs  Lab 09/29/22 1224  TSH 0.130*    BNP Recent Labs  Lab 09/29/22 1926  BNP 195.4*    DDimer  Recent Labs  Lab 09/29/22 0654  DDIMER 0.97*     Radiology    No results found.  Cardiac Studies   Planning for PCI of LAD today  Echo 09/30/22 LVEF 60-65%, no RWMA Grade 1 DD Normal RV Mild LAE No significant valvular disease, but aortic valve sclerosis    LHC 09/30/22:   Ramus-2 lesion is 75% stenosed.   Ramus-1 lesion is 80% stenosed.   Mid LAD lesion is 75% stenosed.   Ost LAD to Prox LAD lesion is 50% stenosed.   Ost Cx to Prox Cx lesion is 60% stenosed.   Mid LM to Ost LAD lesion is 40% stenosed.   Ost RCA to Prox RCA lesion is 50% stenosed.   RPDA lesion is 75% stenosed.   LV end diastolic pressure is normal.   There is no aortic valve stenosis.   Severe  right subclavian tortuosity.  85 cm destination sheath not available today.  Right groin approach used.   Moderate disease in the distal left main, ostial LAD and ostial circumflex.  Severe disease in the large ramus and mid LAD.  Will obtain surgical consultation.  I discussed the findings with the wife and specifically asked about the DNR status.  It was meant to be more related to 'he would not want to live on a vent if he had brain damage.'    If he was not a candidate for CABG due to other comorbidities, could consider PCI of the mid LAD and medical therapy of the ramus vessel and other moderate disease.  Patient Profile     77 y.o. male with a hx of MI in 74 with no CAD at cath >> vasospasm, recurrent DVT/PE, DM, HTN, HLD intol statins, aortic atherosclerosis, who is being seen for the evaluation of chest pain and elevated troponin.   Assessment & Plan    NSTEMI with severe multivessel disease Chest pain with HST 534 --> 357 Echo report not transferring to epic but shows EF 60 to 65%, normal RV function, no significant valvular disease Heart cath 09/30/22 with multivessel disease, turned down by CT surgery given limited mobility --> planning of PCI to LAD today - he denies chest pain today - continue ASA and heparin gtt - added imdur yesterday for CP   Brief burst of tachycardia at 19:23 - suspect this may be atrial tachycardia as p waves are present, although different morphology, could be MAT - may consider BB   Hyperlipidemia with LDL goal < 70 Lipid panel not done yesterday - I have ordered again - given autoimmune disease, may consider a lower LDL goal - intolerant to statins, will need PCSK9i / lipid clinic referral   Hypertension Continue amlodipine and imdur   RA Humira, plaquenil   Hx of PE/DVT Off eliquis, on heparin gtt   Disposition Wife pending surgery ?today Limited mobility, ordered PT/OT yesterday, may need rehab after discharge   For questions  or updates, please contact [facility] Please consult [email or web address] for contact info under     Signed, Marcelino Duster, PA  10/02/2022, 10:21 AM  Patient seen and examined.  Agree with above documentation.  On exam, patient is alert and oriented, regular rate and rhythm, no murmurs, lungs CTAB, no LE edema or JVD.  Underwent successful PCI to mid LAD today.  Doing well, denies any chest pain.  Will discuss switching to Brilinta with pharmacy.    Little Ishikawa, MD

## 2022-10-02 NOTE — Care Management (Signed)
  Transition of Care [facility]) Screening Note   Patient Details  Name: William Hunter Date of Birth: Jun 29, 1945   Transition of [facility]) CM/SW Contact:    Gala Lewandowsky, RN Phone Number: 10/02/2022, 2:07 PM    Transition of Care Department [facility]) has reviewed the patient and no TOC needs have been identified at this time. Patient presented for staged PCI. No home needs identified at this time. Patient has insurance and PCP.  We will continue to monitor patient advancement through interdisciplinary progression rounds. If new patient transition needs arise, please place a TOC consult.

## 2022-10-02 NOTE — Progress Notes (Signed)
OT Cancellation Note  Patient Details Name: William Hunter MRN: [id number] DOB: 1946/02/24   Cancelled Treatment:    Reason Eval/Treat Not Completed: Patient at procedure or test/ unavailable (Pt off unit, OT will f/u as able).  10/02/2022  AB, OTR/L  Acute Rehabilitation Services  Office: [phone]   Tristan Schroeder 10/02/2022, 10:45 AM

## 2022-10-02 NOTE — Evaluation (Signed)
Occupational Therapy Evaluation Patient Details Name: William Hunter MRN: [id number] DOB: Dec 08, 1945 Today's Date: 10/02/2022   History of Present Illness Pt is 77 year old presented [facility] on  09/29/22 for chest pain. Pt with NSTEMI. Cardiac cath 4/22 showed severe multivessel CAD. Pt is now s/p L heart cath 10/01/20. Pt not surgical candidate. Plans for stent on 10/02/22. PMH - OA, CAD, HTN, RA, DMII, DVT/PE   Clinical Impression   Pt admitted for above dx, PTA patient lived with spouse but they cannot provide any assistance d/t surgery. PTA patient reports using RW and completing bADLs at baseline but having assistance with iADLs from daughter and wife. Pt currently not able to sustain EOB sitting balance for extended periods of time without assistance. D/t impaired sitting balance grooming was performed at bed level for safety and OOB mobility was deferred. Pt also generally weak. Pt max A for bed mobility, educated patient on LUE precautions post heart cath and to be mindful not to roll on it. Pt would benefit from continued acute skilled OT services to address above deficits and help transition to next level of care. Patient would benefit from post acute skilled rehab facility with <3 hours of therapy and 24/7 support      Recommendations for follow up therapy are one component of a multi-disciplinary discharge planning process, led by the attending physician.  Recommendations may be updated based on patient status, additional functional criteria and insurance authorization.   Assistance Recommended at Discharge Frequent or constant Supervision/Assistance  Patient can return home with the following A lot of help with bathing/dressing/bathroom;Assistance with cooking/housework;Assistance with feeding;Two people to help with walking and/or transfers;Direct supervision/assist for medications management;Assist for transportation;Help with stairs or ramp for entrance    Functional Status Assessment   Patient has had a recent decline in their functional status and demonstrates the ability to make significant improvements in function in a reasonable and predictable amount of time.  Equipment Recommendations  Other (comment) (defer to next level of care)    Recommendations for Other Services       Precautions / Restrictions Precautions Precautions: Fall;Other (comment) Precaution Comments: L heart cath Restrictions Weight Bearing Restrictions: No Other Position/Activity Restrictions: LUE no push/pull/lift      Mobility Bed Mobility Overal bed mobility: Needs Assistance Bed Mobility: Supine to Sit     Supine to sit: Max assist Sit to supine: Max assist   General bed mobility comments: Assist for BLEs, and rolling d/t L heart cath. Cues to push on RUE elbow to assist with Supine>sit. Assist for legs and rolling back into bed.    Transfers                   General transfer comment: Deferred. Pt with poor to zero EOB and he declined STS d/t fatigue from being at procedure all day      Balance Overall balance assessment: Needs assistance Sitting-balance support: Bilateral upper extremity supported, Feet supported Sitting balance-Leahy Scale: Poor Sitting balance - Comments: Pt poor to zero EOB static sitting, used rails for support Min guard but needing occasional Mod A to recover from posterior lean Postural control: Posterior lean     Standing balance comment: NT                           ADL either performed or assessed with clinical judgement   ADL Overall ADL's : Needs assistance/impaired Eating/Feeding: Bed level;Independent  Grooming: Therapist, nutritional;Bed level;Supervision/safety;Set up   Upper Body Bathing: Bed level;Minimal assistance Upper Body Bathing Details (indicate cue type and reason): pt not able to maintain balance EOB for extended periods Lower Body Bathing: Maximal assistance;Sitting/lateral leans   Upper Body Dressing : Minimal  assistance;Bed level   Lower Body Dressing: Maximal assistance;Bed level     Toilet Transfer Details (indicate cue type and reason): NT; EOB balance poor and unable to maintain Toileting- Clothing Manipulation and Hygiene: Bed level;Maximal assistance     Tub/Shower Transfer Details (indicate cue type and reason): NT   General ADL Comments: OOB mobility not safe in today's session     Vision         Perception     Praxis      Pertinent Vitals/Pain Pain Assessment Pain Assessment: No/denies pain     Hand Dominance Left   Extremity/Trunk Assessment Upper Extremity Assessment Upper Extremity Assessment: Generalized weakness   Lower Extremity Assessment Lower Extremity Assessment: Generalized weakness   Cervical / Trunk Assessment Cervical / Trunk Assessment: Kyphotic   Communication Communication Communication: No difficulties   Cognition                                             General Comments  Pt reported dizziness upon sitting EOB. BP 118/74    Exercises     Shoulder Instructions      Home Living Family/patient expects to be discharged to:: Private residence Living Arrangements: Spouse/significant other Available Help at Discharge: Other (Comment) (wife is having surgery very soon and won't be available to help) Type of Home: House Home Access: Stairs to enter [employer] of Steps: 1 Entrance Stairs-Rails: None Home Layout: One level     Bathroom Shower/Tub: Producer, television/film/video: Handicapped height     Home Equipment: Agricultural consultant (2 wheels);Cane - single point;Wheelchair - manual;Toilet riser;Grab bars - tub/shower;Grab bars - toilet;Shower seat;Hand held shower head   Additional Comments: 3 falls in the last 6 months d/t legs giving out.      Prior Functioning/Environment Prior Level of Function : Needs assist;History of Falls (last six months)             Mobility Comments: Pt reports  he was amb with RW. Needed assist some of the time and at other times able to amb modified independent ADLs Comments: pt reports reports being independent in bADLs. Daughter assisted with iADLs. Wifes manages meds        OT Problem List: Decreased strength;Impaired balance (sitting and/or standing);Decreased activity tolerance;Impaired UE functional use      OT Treatment/Interventions: Self-care/ADL training;Energy conservation;Therapeutic exercise;Patient/family education;DME and/or AE instruction;Balance training;Therapeutic activities    OT Goals(Current goals can be found in the care plan section) Acute Rehab OT Goals Patient Stated Goal: to get strong enough to do things independently OT Goal Formulation: With patient Time For Goal Achievement: 10/16/22 Potential to Achieve Goals: Fair  OT Frequency: Min 2X/week    Co-evaluation              AM-PAC OT "6 Clicks" Daily Activity     Outcome Measure Help from another person eating meals?: A Little Help from another person taking care of personal grooming?: A Little Help from another person toileting, which includes using toliet, bedpan, or urinal?: Total Help from another person bathing (including washing, rinsing, drying)?: A Lot  Help from another person to put on and taking off regular upper body clothing?: A Little Help from another person to put on and taking off regular lower body clothing?: A Lot 6 Click Score: 14   End of Session Nurse Communication: Need for lift equipment  Activity Tolerance: Patient limited by fatigue Patient left: in bed;with call bell/phone within reach  OT Visit Diagnosis: Unsteadiness on feet (R26.81);Muscle weakness (generalized) (M62.81);History of falling (Z91.81)                Time: 6440-3474 OT Time Calculation (min): 25 min Charges:  OT General Charges $OT Visit: 1 Visit OT Evaluation $OT Eval Moderate Complexity: 1 Mod OT Treatments $Therapeutic Activity: 8-22  mins  10/02/2022  AB, OTR/L  Acute Rehabilitation Services  Office: [phone]   Tristan Schroeder 10/02/2022, 6:29 PM

## 2022-10-02 NOTE — Progress Notes (Signed)
Pt arrived back to 6E from cath lab with left radial TR band level 0 10cc left in the band. Pt declines any chest pain at this time.

## 2022-10-02 NOTE — Progress Notes (Signed)
PROGRESS NOTE William Hunter  ZOX:[id number] DOB: Sep 30, 1945 DOA: 09/29/2022 PCP: Irena Reichmann, DO  Brief Narrative/Hospital Course: 77 year old male with history of DVT/PE in 2021, has not had Eliquis since 2 weeks-prescription expired, history of CAD/coronary artery spasm had cardiac cath >>> vasospasm, hypertension, migraine diabetes mellitus,rheumatoid arthritis, slipped vertebral cervical disc presenting with chest pain over the last few weeks. He woke up from sleep earlier this a.m. and episode of chest pain, his usual angina with associated sweating lasting for an hour and resolved.  Pain lasted for 2 hours today and came to ED, In ED: temp 100.9, heart rate 113 BP in 140s to 160s systolic, saturating on room air Labs showed mild hyponatremia, hypokalemia with a stable renal function, troponin elevated at 534 357>, lactic acid 1.9, normal CBC, D-dimer 0.97.Reviewed chest x-ray clear.  CT angio chest no evidence of PE or acute intrathoracic finding, possible esophagitis, aortic and coronary artery atherosclerosis noted Cardiology was consulted, aspirin 81 placed on heparin drip and admission was requested for further management. Patient was admitted for NSTEMI Underwent left heart cath that showed severe three-vessel disease Seen by cardio thoracic surgery> but given his comorbidities felt that he would have serious limitations for recovery following CABG, although limited revascularization best option is probably PCI and medical therapy per [facility]   Subjective: Patient seen and examined this morning resting comfortably denies chest pain reports his stool was brown denies any black stool or bloody stool  Labs this morning stable hemoglobin 12.9 g Afebrile overnight Initial leukocytosis has resolved Npo for cath  Assessment and Plan: Principal Problem:   Non-ST elevation (NSTEMI) myocardial infarction Active Problems:   Coronary artery disease   Hypertension   Dyslipidemia   Coronary  vasospasm ([name]   History of pulmonary embolism   Chest pain   Elevated troponin   NSTEMI CAD-coronary artery atherosclerosis-coronary spasm history: Found to have showed severe three-vessel disease IN [facility] Seen by cardio thoracic surgery> but given his comorbidities felt that he would have serious limitations for recovery following CABG Planning for PCI and remains n.p.o. today.Continue aspirin 81, Imdur and further plan per cardiology   Hypertension: BP is well-controlled, continue Amlodipine    Dyslipidemia: Lpa/lipid profile pending  Esophagitis per CT scan: Continue PPI   Low-grade fever: Unclear etiology respiratory virus panel unremarkable chest x-ray and CT imaging no acute lung findings.  Resolved no recurrence   Hypokalemia resolved   History of pulmonary embolism/VTE/ positive D-dimer:Now on heparin drip, d dimer slightly up> continue anticoagulation per cardiology   ?Blood in pad-FOBT+: Hemoglobin remains stable no drop.  Patient denies any bleeding, reports having brown stool.reports he had colonoscopy and endoscopy 9 years ago and unremarkable> monitor closely if no active bleeding we will do GI referral on dc/. Recent Labs  Lab 09/26/22 0939 09/29/22 0654 09/30/22 0241 10/01/22 0307 10/02/22 0218  HGB 15.9 14.8 13.9 13.4 12.9*  HCT 46.3 42.5 39.9 39.7 36.3*     T2DM: A1c 6.7, blood sugar poorly poorly controlled continue SSI.   Recent Labs  Lab 09/29/22 1926 09/30/22 0841 10/01/22 0759 10/01/22 1200 10/01/22 1601 10/01/22 2112 10/02/22 0744  GLUCAP  --    < > 185* 238* 263* 210* 186*  HGBA1C 6.7*  --   --   --   --   --   --    < > = values in this interval not displayed.      RA: On Humira and Plaquenil followed by rheumatology as outpatient  Code status:DNR.  DVT prophylaxis: heparin gtt Code Status:   Code Status: DNR Family Communication: plan of care discussed with patient at bedside. Patient status is:  inpatient  because of further  cardiac workup Level of care: Progressive  Dispo: The patient is from: home            Anticipated disposition: tbd. PTOT to cont once able Objective: Vitals last 24 hrs: Vitals:   10/01/22 0800 10/01/22 1933 10/02/22 0546 10/02/22 0746  BP: 136/77 114/66 127/78 138/78  Pulse: 74 80 77 80  Resp: 16 18 18 18   Temp: 98.4 F (36.9 C) 98.7 F (37.1 C) 98.1 F (36.7 C) 98 F (36.7 C)  TempSrc: Oral Oral Oral Oral  SpO2: 96% 96% 97% 98%  Weight:   82 kg   Height:       Weight change:   Physical Examination: General exam: alert awake, oriented, pleasant, older than stated age HEENT:Oral mucosa moist, Ear/Nose WNL grossly Respiratory system: Bilaterally clear BS, no use of accessory muscle Cardiovascular system: S1 & S2 +, No JVD. Gastrointestinal system: Abdomen soft,NT,ND, BS+ Nervous System: Alert, awake, moving extremities, hefollows commands. Extremities: LE edema neg,distal peripheral pulses palpable.  Skin: No rashes,no icterus. MSK: Normal muscle bulk,tone, power   Medications reviewed:  Scheduled Meds:  nitroGLYCERIN       [MAR Hold] amLODipine  5 mg Oral Daily   [MAR Hold] aspirin  81 mg Oral Daily   [MAR Hold] carbidopa-levodopa  1 tablet Oral TID   [MAR Hold] folic acid  1 mg Oral Daily   [MAR Hold] hydroxychloroquine  200 mg Oral Daily   [MAR Hold] insulin aspart  0-5 Units Subcutaneous QHS   [MAR Hold] insulin aspart  0-9 Units Subcutaneous TID WC   [MAR Hold] isosorbide mononitrate  15 mg Oral Daily   [MAR Hold] mirabegron ER  25 mg Oral Daily   [MAR Hold] sodium chloride flush  3 mL Intravenous Q12H   [MAR Hold] sodium chloride flush  3 mL Intravenous Q12H   [MAR Hold] sodium chloride flush  3 mL Intravenous Q12H   Continuous Infusions:  [MAR Hold] sodium chloride     sodium chloride     sodium chloride 1 mL/kg/hr (10/02/22 0635)   heparin 1,700 Units/hr (10/01/22 2148)    Diet Order             Diet NPO time specified  Diet effective now                   Intake/Output Summary (Last 24 hours) at 10/02/2022 1138 Last data filed at 10/02/2022 0500 Gross per 24 hour  Intake 120.61 ml  Output 850 ml  Net -729.39 ml    Net IO Since Admission: -1,162.11 mL [10/02/22 1138]  Wt Readings from Last 3 Encounters:  10/02/22 82 kg  09/26/22 78.5 kg  04/10/22 82.1 kg     Unresulted Labs (From admission, onward)     Start     Ordered   10/03/22 0500  Lipid panel  Tomorrow morning,   R       Question:  Specimen collection method  Answer:  Lab=Lab collect   10/02/22 1027   10/02/22 0500  Heparin level (unfractionated)  Daily,   R     Question:  Specimen collection method  Answer:  Lab=Lab collect   10/01/22 1121   10/01/22 0500  Lipoprotein A (LPA)  Tomorrow morning,   R       Question:  Specimen collection method  Answer:  Lab=Lab collect   09/30/22 1031   10/01/22 0500  Basic metabolic panel  Daily,   R     Question:  Specimen collection method  Answer:  Lab=Lab collect   09/30/22 1145   10/01/22 0500  CBC  Daily,   R     Question:  Specimen collection method  Answer:  Lab=Lab collect   09/30/22 1145   09/30/22 0500  Comprehensive metabolic panel  Daily,   R      09/29/22 1045   09/30/22 0500  CBC  Daily,   R      09/29/22 1045   09/29/22 0714  Urinalysis, Routine w reflex microscopic -Urine, Clean Catch  (Undifferentiated presentation (screening labs and basic nursing orders))  ONCE - URGENT,   URGENT       Question:  Specimen Source  Answer:  Urine, Clean Catch   09/29/22 0714          Data Reviewed: I have personally reviewed following labs and imaging studies CBC: Recent Labs  Lab 09/26/22 0939 09/29/22 0654 09/30/22 0241 10/01/22 0307 10/02/22 0218  WBC 20.9* 9.5 15.5* 9.2 8.6  NEUTROABS 18,476* 8.1*  --   --   --   HGB 15.9 14.8 13.9 13.4 12.9*  HCT 46.3 42.5 39.9 39.7 36.3*  MCV 91.7 90.4 91.3 92.1 89.4  PLT 301 277 218 258 247    Basic Metabolic Panel: Recent Labs  Lab 09/26/22 0939  09/29/22 0654 09/30/22 0241 10/01/22 0307 10/02/22 0218  NA 135 134* 134* 134* 133*  K 3.9 3.4* 3.6 3.3* 3.7  CL 101 101 97* 99 102  CO2 GLUCOSE 182* 185* 119* 170* 174*  BUN CREATININE 0.91 0.90 0.80 0.85 0.81  CALCIUM 8.9 8.5* 8.7* 8.6* 8.2*    GFR: Estimated Creatinine Clearance: 82.6 mL/min (by C-G formula based on SCr of 0.81 mg/dL). Liver Function Tests: Recent Labs  Lab 09/26/22 0939 09/29/22 0654 09/30/22 0241 10/01/22 0307 10/02/22 0218  AST 14*  ALT <5 7  ALKPHOS  --  59 62 61 58  BILITOT 0.8 0.6 0.8 0.6 0.6  PROT 8.0 7.4 6.6 6.7 6.5  ALBUMIN  --  3.1* 2.7* 2.5* 2.5*    No results for input(s): "LIPASE", "AMYLASE" in the last 168 hours. No results for input(s): "AMMONIA" in the last 168 hours. Coagulation Profile: Recent Labs  Lab 09/29/22 0654  INR 1.0    BNP (last 3 results) No results for input(s): "PROBNP" in the last 8760 hours. HbA1C: Recent Labs    09/29/22 1926  HGBA1C 6.7*    CBG: Recent Labs  Lab 10/01/22 0759 10/01/22 1200 10/01/22 1601 10/01/22 2112 10/02/22 0744  GLUCAP 185* 238* 263* 210* 186*    Lipid Profile: No results for input(s): "CHOL", "HDL", "LDLCALC", "TRIG", "CHOLHDL", "LDLDIRECT" in the last 72 hours. Thyroid Function Tests: Recent Labs    09/29/22 1224  TSH 0.130*    Sepsis Labs: Recent Labs  Lab 09/29/22 0654 09/29/22 1926  LATICACIDVEN 1.9 1.5     Recent Results (from the past 240 hour(s))  Blood Culture (routine x 2)     Status: None (Preliminary result)   Collection Time: 09/29/22  6:55 AM   Specimen: BLOOD RIGHT FOREARM  Result Value Ref Range Status   Specimen Description BLOOD RIGHT FOREARM  Final   Special Requests   Final  BOTTLES DRAWN AEROBIC AND ANAEROBIC Blood Culture adequate volume   Culture   Final    NO GROWTH 3 DAYS Performed at [facility], [address] [place], [place] [address]    Report Status PENDING   Incomplete  Blood Culture (routine x 2)     Status: None (Preliminary result)   Collection Time: 09/29/22  8:56 AM   Specimen: BLOOD  Result Value Ref Range Status   Specimen Description BLOOD SITE NOT SPECIFIED  Final   Special Requests   Final    BOTTLES DRAWN AEROBIC AND ANAEROBIC Blood Culture results may not be optimal due to an inadequate volume of blood received in culture bottles   Culture   Final    NO GROWTH 3 DAYS Performed at [facility], [address] [place], [place] [address]    Report Status PENDING  Incomplete  Respiratory (~20 pathogens) panel by PCR     Status: None   Collection Time: 09/29/22  5:38 PM   Specimen: Nasopharyngeal Swab; Respiratory  Result Value Ref Range Status   Adenovirus NOT DETECTED NOT DETECTED Final   Coronavirus 229E NOT DETECTED NOT DETECTED Final    Comment: (NOTE) The Coronavirus on the Respiratory Panel, DOES NOT test for the novel  Coronavirus (2019 nCoV)    Coronavirus HKU1 NOT DETECTED NOT DETECTED Final   Coronavirus NL63 NOT DETECTED NOT DETECTED Final   Coronavirus OC43 NOT DETECTED NOT DETECTED Final   Metapneumovirus NOT DETECTED NOT DETECTED Final   Rhinovirus / Enterovirus NOT DETECTED NOT DETECTED Final   Influenza A NOT DETECTED NOT DETECTED Final   Influenza B NOT DETECTED NOT DETECTED Final   Parainfluenza Virus 1 NOT DETECTED NOT DETECTED Final   Parainfluenza Virus 2 NOT DETECTED NOT DETECTED Final   Parainfluenza Virus 3 NOT DETECTED NOT DETECTED Final   Parainfluenza Virus 4 NOT DETECTED NOT DETECTED Final   Respiratory Syncytial Virus NOT DETECTED NOT DETECTED Final   Bordetella pertussis NOT DETECTED NOT DETECTED Final   Bordetella Parapertussis NOT DETECTED NOT DETECTED Final   Chlamydophila pneumoniae NOT DETECTED NOT DETECTED Final   Mycoplasma pneumoniae NOT DETECTED NOT DETECTED Final    Comment: Performed at [facility], [address] [place], [place] [address]     Antimicrobials: Anti-infectives (From admission, onward)    Start     Dose/Rate Route Frequency Ordered Stop   09/29/22 1430  [MAR Hold]  hydroxychloroquine (PLAQUENIL) tablet 200 mg        (MAR Hold since Wed 10/02/2022 at 1037.Hold Reason: Transfer to a Procedural area)   200 mg Oral Daily 09/29/22 1421        Culture/Microbiology    Component Value Date/Time   SDES BLOOD SITE NOT SPECIFIED 09/29/2022 0856   SPECREQUEST  09/29/2022 0856    BOTTLES DRAWN AEROBIC AND ANAEROBIC Blood Culture results may not be optimal due to an inadequate volume of blood received in culture bottles   CULT  09/29/2022 0856    NO GROWTH 3 DAYS Performed at [facility], [address] [place], [place] [address]    REPTSTATUS PENDING 09/29/2022 [phone]  Radiology Studies: No results found.   LOS: 2 days   Lanae Boast, MD [facility]  10/02/2022, 11:38 AM

## 2022-10-02 NOTE — Interval H&P Note (Signed)
Cath Lab Visit (complete for each Cath Lab visit)  Clinical Evaluation Leading to the Procedure:   ACS: Yes.    Non-ACS:    Anginal Classification: CCS IV  Anti-ischemic medical therapy: Minimal Therapy (1 class of medications)  Non-Invasive Test Results: No non-invasive testing performed  Prior CABG: No previous CABG   Turned down for CABG   History and Physical Interval Note:  10/02/2022 11:14 AM  William Hunter  has presented today for surgery, with the diagnosis of cad.  The various methods of treatment have been discussed with the patient and family. After consideration of risks, benefits and other options for treatment, the patient has consented to  Procedure(s): CORONARY STENT INTERVENTION (N/A) as a surgical intervention.  The patient's history has been reviewed, patient examined, no change in status, stable for surgery.  I have reviewed the patient's chart and labs.  Questions were answered to the patient's satisfaction.     Lance Muss

## 2022-10-02 NOTE — TOC Benefit Eligibility Note (Signed)
Patient Product/process development scientist completed.    The patient is currently admitted and upon discharge could be taking Brilinta 90 mg.  The current 30 day co-pay is $45.00.   The patient is insured through [employer] Part D   This test claim was processed through [facility]- copay amounts may vary at other pharmacies due to pharmacy/plan contracts, or as the patient moves through the different stages of their insurance plan.  Roland Earl, CPHT Pharmacy Patient Advocate Specialist [facility] Patient Advocate Team Direct Number: [phone]  Fax: [phone]

## 2022-10-02 NOTE — Progress Notes (Signed)
ANTICOAGULATION CONSULT NOTE  Pharmacy Consult for heparin Indication: chest pain/ACS  Allergies  Allergen Reactions   Codeine Swelling    Other reaction(s): rash   Penicillins Hives and Swelling    Has patient had a PCN reaction causing immediate rash, facial/tongue/throat swelling, SOB or lightheadedness with hypotension: Yes Has patient had a PCN reaction causing severe rash involving mucus membranes or skin necrosis: No Has patient had a PCN reaction that required hospitalization No Has patient had a PCN reaction occurring within the last 10 years: No If all of the above answers are "NO", then may proceed with Cephalosporin use.    Prednisone Hives    Other reaction(s): hives   Wool Alcohol [Lanolin] Hives   Empagliflozin Nausea And Vomiting    Other reaction(s): nausea and vomiting   Penicillin G     Other reaction(s): rash   Statins     Other reaction(s): myopathy    Patient Measurements: Height:  (177.8 cm) Weight: 82 kg (180 lb 12.4 oz) IBW/kg (Calculated) : 73 Heparin Dosing Weight: TBW  Vital Signs: Temp: 98.1 F (36.7 C) (04/24 0546) Temp Source: Oral (04/24 0546) BP: 127/78 (04/24 0546) Pulse Rate: 77 (04/24 0546)  Labs: Recent Labs    09/29/22 0914 09/29/22 1800 09/29/22 2150 09/30/22 0241 10/01/22 0307 10/01/22 1028 10/01/22 1930 10/02/22 0218  HGB  --    < >  --  13.9 13.4  --   --  12.9*  HCT  --   --   --  39.9 39.7  --   --  36.3*  PLT  --   --   --  218 258  --   --  247  APTT  --   --  66*  --   --   --   --   --   HEPARINUNFRC  --    < >  --  0.20*  --  0.16* 0.25* 0.47  CREATININE  --   --   --  0.80 0.85  --   --  0.81  TROPONINIHS 357*  --   --   --   --   --   --   --    < > = values in this interval not displayed.     Estimated Creatinine Clearance: 82.6 mL/min (by C-G formula based on SCr of 0.81 mg/dL).   Medical History: Past Medical History:  Diagnosis Date   Anginal pain    Arthritis    "fingers" (03/09/2012)    Cold feet    "from my diabetes; they stay cold" (03/09/2012)   Coronary artery disease    Coronary artery spasm    since age 36 Dr Aleen Campi   COVID 07/06/2019   hospitalizied   Dyslipidemia    Femoral DVT (deep venous thrombosis)    left   Headache(784.0)    Hx of gallstones    Dr Derrell Lolling lap cholecystectomy   Hx of plastic surgery    plastic surgery following a fall off a loading dock ,lost  all his upper teeth    Hypertension    MI, old    at age 9   Migraines    Pneumonia ~ 1962; 1970's   "double"; "regular" (03/09/2012)   Rheumatoid arthritis    Dr Dierdre Forth    Slipped cervical disc 06/10/2006   Dr Patsi Sears in the past/ Dr Larita Fife , chiropractor in 2008   Thyroid disease    Dr Lucianne Muss   Type II diabetes mellitus  Medications:  Facility-Administered Medications Prior to Admission  Medication Dose Route Frequency Provider Last Rate Last Admin   Study - ORION 4 - inclisiran 300 mg/1.32mL or placebo SQ injection (PI-Stuckey)  300 mg Subcutaneous Q6 months Herby Abraham, MD       Medications Prior to Admission  Medication Sig Dispense Refill Last Dose   apixaban (ELIQUIS) 5 MG TABS tablet Take 10 mg p.o. twice daily, on  Wed 12/05/21 at 1000-switch to 5 mg p.o. twice daily. 60 tablet  Past Month   carbidopa-levodopa (SINEMET IR) 25-100 MG tablet Take 1 tablet by mouth 3 (three) times daily. 90 tablet 6 09/28/2022   folic acid (FOLVITE) 1 MG tablet Take 1 tablet (1 mg total) by mouth daily. 90 tablet 3 09/28/2022   GVOKE HYPOPEN 2-PACK 1 MG/0.2ML SOAJ Inject into the skin.      hydroxychloroquine (PLAQUENIL) 200 MG tablet Take 1 tablet (200 mg total) by mouth daily. 90 tablet 0 09/28/2022   insulin aspart (NOVOLOG) 100 UNIT/ML injection 0-5 Units, Subcutaneous, Daily at bedtime: HS scale CBG < 70: implement hypoglycemia measures CBG 70 - 120: 0 units CBG 121 - 150: 0 units CBG 151 - 200: 0 units CBG 201 - 250: 2 units CBG 251 - 300: 3 units CBG 301 - 350: 4 units CBG 351 - 400:  5 units CBG > 400: call MD and obtain STAT lab verification 10 mL 11 09/28/2022   insulin aspart (NOVOLOG) 100 UNIT/ML injection 0-15 Units, Subcutaneous, 3 times daily with meals CBG < 70: Implement Hypoglycemia Standing Orders and refer to Hypoglycemia Standing Orders sidebar report CBG 70 - 120: 0 units CBG 121 - 150: 2 units CBG 151 - 200: 3 units CBG 201 - 250: 5 units CBG 251 - 300: 8 units CBG 301 - 350: 11 units CBG 351 - 400: 15 units CBG > 400: call MD and obtain STAT lab verification 10 mL 11 09/28/2022   meclizine (ANTIVERT) 25 MG tablet Take 25 mg by mouth 3 (three) times daily as needed for dizziness.      methotrexate (RHEUMATREX) 2.5 MG tablet Take 6 tablets (15 mg total) by mouth once a week. Caution:Chemotherapy. Protect from light. 72 tablet 0 Past Week   MYRBETRIQ 25 MG TB24 tablet Take 25 mg by mouth daily.   09/28/2022   BD PEN NEEDLE MICRO U/F 32G X 6 MM MISC Inject into the skin 3 (three) times daily.      BD PEN NEEDLE NANO 2ND GEN 32G X 4 MM MISC       Continuous Glucose Sensor (FREESTYLE LIBRE 2 SENSOR) MISC Place onto the skin.      diltiazem (TIAZAC) 300 MG 24 hr capsule Take 1 capsule (300 mg total) by mouth daily. (Patient not taking: Reported on 09/26/2022) 90 capsule 3    Study - ORION 4 - inclisiran 300 mg/1.47mL or placebo SQ injection (PI-Stuckey) Inject 1.5 mLs (300 mg total) into the skin every 6 (six) months. (Patient not taking: Reported on 09/26/2022) 1 mL 1     Assessment: 76 YOM presenting with CP, elevated troponin, CBC wnl, has been on Eliquis for hx of PE however has run out of this and states he has not taken in at least 3 weeks. Heparin was stopped prior to cath 4/22. Pharmacy consulted to restart.  Heparin level is therapeutic at 0.47, CBC stable. Staged PCI planned today.  Goal of Therapy:  Heparin level 0.3-0.7 units/ml Monitor platelets by anticoagulation protocol: Yes  Plan:  Continue heparin 1700 units/h Daily heparin level and CBC Will F/U  plans post/cath   Fredonia Highland, PharmD, BCPS, [facility] Clinical Pharmacist [phone] Please check AMION for all [facility] numbers 10/02/2022

## 2022-10-02 NOTE — TOC Initial Note (Addendum)
Transition of Care [facility]) - Initial/Assessment Note    Patient Details  Name: William Hunter MRN: [id number] Date of Birth: 21-May-1946  Transition of Care [facility]) CM/SW Contact:    Delilah Shan, LCSWA Phone Number: 10/02/2022, 3:33 PM  Clinical Narrative:                  CSW received consult for possible SNF placement at time of discharge. CSW spoke with patient at bedside regarding PT recommendation of SNF placement at time of discharge.Patient reports PTA he comes from home with spouse. Patient expressed understanding of PT recommendation and is agreeable to SNF placement at time of discharge. Patient gave CSW permission to fax out initial referral near the [facility] and [place] area. CSW discussed insurance authorization process.  No further questions reported at this time. CSW to continue to follow and assist with discharge planning needs.   CSW started insurance authorization for patient. Auth ID# [id number]. CSW will add patients facility choice once confirmed by patient. CSW will continue to follow.   Barriers to Discharge: Continued Medical Work up   Patient Goals and CMS Choice Patient states their goals for this hospitalization and ongoing recovery are:: SNF CMS Medicare.gov Compare Post Acute Care list provided to:: Patient Choice offered to / list presented to : Patient      Expected Discharge Plan and Services In-house Referral: Clinical Social Work     Living arrangements for the past 2 months: Single Family Home                                      Prior Living Arrangements/Services Living arrangements for the past 2 months: Single Family Home Lives with:: Spouse Patient language and need for interpreter reviewed:: Yes Do you feel safe going back to the place where you live?: No   SNF  Need for Family Participation in Patient Care: Yes (Comment) Care giver support system in place?: Yes (comment)   Criminal Activity/Legal Involvement Pertinent to  Current Situation/Hospitalization: No - Comment as needed  Activities of Daily Living Home Assistive Devices/Equipment: Dentures (specify type), Eyeglasses, Walker (specify type) (front-wheel walker) ADL Screening (condition at time of admission) Patient's cognitive ability adequate to safely complete daily activities?: Yes Is the patient deaf or have difficulty hearing?: No Does the patient have difficulty seeing, even when wearing glasses/contacts?: No Does the patient have difficulty concentrating, remembering, or making decisions?: No Patient able to express need for assistance with ADLs?: Yes Does the patient have difficulty dressing or bathing?: Yes Independently performs ADLs?: No Communication: Independent Dressing (OT): Needs assistance Is this a change from baseline?: Change from baseline, expected to last <3days Grooming: Independent Feeding: Independent Bathing: Needs assistance Is this a change from baseline?: Change from baseline, expected to last <3 days Toileting: Needs assistance Is this a change from baseline?: Change from baseline, expected to last <3 days In/Out Bed: Needs assistance Is this a change from baseline?: Change from baseline, expected to last <3 days Walks in Home: Needs assistance Is this a change from baseline?: Change from baseline, expected to last <3 days Does the patient have difficulty walking or climbing stairs?: Yes Weakness of Legs: Both Weakness of Arms/Hands: Both  Permission Sought/Granted Permission sought to share information with : Case Manager, Family Supports, Oceanographer granted to share information with : Yes, Verbal Permission Granted  Share Information with NAME: Talbert Forest  Permission  granted to share info w AGENCY: SNF  Permission granted to share info w Relationship: Spouse  Permission granted to share info w Contact Information: Talbert Forest [phone]  Emotional Assessment Appearance:: Appears  stated age Attitude/Demeanor/Rapport: Gracious Affect (typically observed): Calm Orientation: : Oriented to Self, Oriented to Place, Oriented to  Time, Oriented to Situation (WDL) Alcohol / Substance Use: Not Applicable Psych Involvement: No (comment)  Admission diagnosis:  Elevated troponin [R79.89] Chest pain [R07.9] Chest pain, unspecified type [R07.9] Patient Active Problem List   Diagnosis Date Noted   Elevated troponin 09/30/2022   Chest pain 09/29/2022   High risk medication use 09/26/2022   Neuropathy 04/10/2022   Abnormal finding on MRI of brain 04/10/2022   Gait abnormality 01/31/2022   Aortic atherosclerosis 12/09/2021   History of pulmonary embolism 12/09/2021   Pure hypercholesterolemia 12/09/2021   Bilateral leg weakness 11/25/2021   Leg weakness, bilateral 11/25/2021   Lower extremity weakness 11/25/2021   Statin myopathy 02/22/2020   Acute respiratory failure with hypoxia 07/03/2019   Acute respiratory failure due to COVID-19 07/03/2019   Migraine with aura and without status migrainosus, not intractable 09/02/2016   Diabetic polyneuropathy associated with type 2 diabetes mellitus 09/02/2016   Rheumatoid arthritis 09/02/2016   Coronary vasospasm ([name] 04/06/2014   Gallstones 03/10/2012   Abdominal pain 03/09/2012   Non-ST elevation (NSTEMI) myocardial infarction    Coronary artery disease    Hypertension    Dyslipidemia    PCP:  Irena Reichmann, DO Pharmacy:   [id number] [place], [place] - [address] [facility] [address] [place] [address] Phone: [phone] Fax: [phone]  [facility] [employer] - [place], [place] - [address] [place] [address] Phone: [phone] Fax: [phone]     Social Determinants of Health (SDOH) Social History: SDOH Screenings   Food Insecurity: No Food Insecurity (09/29/2022)  Housing: Low Risk  (09/29/2022)   Transportation Needs: No Transportation Needs (09/29/2022)  Utilities: Not At Risk (09/29/2022)  Tobacco Use: Low Risk  (10/01/2022)   SDOH Interventions:     Readmission Risk Interventions     No data to display

## 2022-10-03 ENCOUNTER — Encounter (HOSPITAL_COMMUNITY): Payer: Self-pay | Admitting: Interventional Cardiology

## 2022-10-03 ENCOUNTER — Other Ambulatory Visit: Payer: Self-pay | Admitting: Physician Assistant

## 2022-10-03 DIAGNOSIS — M069 Rheumatoid arthritis, unspecified: Secondary | ICD-10-CM | POA: Diagnosis not present

## 2022-10-03 DIAGNOSIS — I959 Hypotension, unspecified: Secondary | ICD-10-CM | POA: Diagnosis not present

## 2022-10-03 DIAGNOSIS — K209 Esophagitis, unspecified without bleeding: Secondary | ICD-10-CM | POA: Diagnosis not present

## 2022-10-03 DIAGNOSIS — E785 Hyperlipidemia, unspecified: Secondary | ICD-10-CM

## 2022-10-03 DIAGNOSIS — Z66 Do not resuscitate: Secondary | ICD-10-CM | POA: Diagnosis not present

## 2022-10-03 DIAGNOSIS — R1111 Vomiting without nausea: Secondary | ICD-10-CM | POA: Diagnosis not present

## 2022-10-03 DIAGNOSIS — D72829 Elevated white blood cell count, unspecified: Secondary | ICD-10-CM | POA: Diagnosis not present

## 2022-10-03 DIAGNOSIS — I2510 Atherosclerotic heart disease of native coronary artery without angina pectoris: Secondary | ICD-10-CM | POA: Diagnosis not present

## 2022-10-03 DIAGNOSIS — I214 Non-ST elevation (NSTEMI) myocardial infarction: Secondary | ICD-10-CM | POA: Diagnosis not present

## 2022-10-03 DIAGNOSIS — Z7401 Bed confinement status: Secondary | ICD-10-CM | POA: Diagnosis not present

## 2022-10-03 DIAGNOSIS — R2681 Unsteadiness on feet: Secondary | ICD-10-CM | POA: Diagnosis not present

## 2022-10-03 DIAGNOSIS — G4700 Insomnia, unspecified: Secondary | ICD-10-CM | POA: Diagnosis not present

## 2022-10-03 DIAGNOSIS — R1312 Dysphagia, oropharyngeal phase: Secondary | ICD-10-CM | POA: Diagnosis not present

## 2022-10-03 DIAGNOSIS — I10 Essential (primary) hypertension: Secondary | ICD-10-CM | POA: Diagnosis not present

## 2022-10-03 DIAGNOSIS — M6281 Muscle weakness (generalized): Secondary | ICD-10-CM | POA: Diagnosis not present

## 2022-10-03 DIAGNOSIS — E1165 Type 2 diabetes mellitus with hyperglycemia: Secondary | ICD-10-CM | POA: Diagnosis not present

## 2022-10-03 DIAGNOSIS — Z8616 Personal history of COVID-19: Secondary | ICD-10-CM | POA: Diagnosis not present

## 2022-10-03 DIAGNOSIS — R531 Weakness: Secondary | ICD-10-CM | POA: Diagnosis not present

## 2022-10-03 DIAGNOSIS — K922 Gastrointestinal hemorrhage, unspecified: Secondary | ICD-10-CM | POA: Diagnosis not present

## 2022-10-03 DIAGNOSIS — E8809 Other disorders of plasma-protein metabolism, not elsewhere classified: Secondary | ICD-10-CM | POA: Diagnosis not present

## 2022-10-03 DIAGNOSIS — R2689 Other abnormalities of gait and mobility: Secondary | ICD-10-CM | POA: Diagnosis not present

## 2022-10-03 DIAGNOSIS — E871 Hypo-osmolality and hyponatremia: Secondary | ICD-10-CM | POA: Diagnosis not present

## 2022-10-03 DIAGNOSIS — Z86711 Personal history of pulmonary embolism: Secondary | ICD-10-CM | POA: Diagnosis not present

## 2022-10-03 DIAGNOSIS — E1142 Type 2 diabetes mellitus with diabetic polyneuropathy: Secondary | ICD-10-CM | POA: Diagnosis not present

## 2022-10-03 DIAGNOSIS — R41841 Cognitive communication deficit: Secondary | ICD-10-CM | POA: Diagnosis not present

## 2022-10-03 DIAGNOSIS — E118 Type 2 diabetes mellitus with unspecified complications: Secondary | ICD-10-CM | POA: Diagnosis not present

## 2022-10-03 LAB — GLUCOSE, CAPILLARY
Glucose-Capillary: 191 mg/dL — ABNORMAL HIGH (ref 70–99)
Glucose-Capillary: 246 mg/dL — ABNORMAL HIGH (ref 70–99)
Glucose-Capillary: 261 mg/dL — ABNORMAL HIGH (ref 70–99)
Glucose-Capillary: 169 mg/dL — ABNORMAL HIGH (ref 70–99)

## 2022-10-03 LAB — CBC
HCT: 38.2 % — ABNORMAL LOW (ref 39.0–52.0)
Hemoglobin: 13.4 g/dL (ref 13.0–17.0)
MCH: 31.1 pg (ref 26.0–34.0)
MCHC: 35.1 g/dL (ref 30.0–36.0)
MCV: 88.6 fL (ref 80.0–100.0)
Platelets: 285 10*3/uL (ref 150–400)
RBC: 4.31 MIL/uL (ref 4.22–5.81)
RDW: 12.8 % (ref 11.5–15.5)
WBC: 9 10*3/uL (ref 4.0–10.5)
nRBC: 0 % (ref 0.0–0.2)

## 2022-10-03 LAB — COMPREHENSIVE METABOLIC PANEL
ALT: 6 U/L (ref 0–44)
AST: 25 U/L (ref 15–41)
Albumin: 2.7 g/dL — ABNORMAL LOW (ref 3.5–5.0)
Alkaline Phosphatase: 65 U/L (ref 38–126)
Anion gap: 11 (ref 5–15)
BUN: 13 mg/dL (ref 8–23)
CO2: 21 mmol/L — ABNORMAL LOW (ref 22–32)
Calcium: 8.5 mg/dL — ABNORMAL LOW (ref 8.9–10.3)
Chloride: 100 mmol/L (ref 98–111)
Creatinine, Ser: 0.79 mg/dL (ref 0.61–1.24)
GFR, Estimated: >60 mL/min (ref >60)
Glucose, Bld: 205 mg/dL — ABNORMAL HIGH (ref 70–99)
Potassium: 3.8 mmol/L (ref 3.5–5.1)
Sodium: 132 mmol/L — ABNORMAL LOW (ref 135–145)
Total Bilirubin: 0.6 mg/dL (ref 0.3–1.2)
Total Protein: 6.8 g/dL (ref 6.5–8.1)

## 2022-10-03 LAB — LIPID PANEL
Cholesterol: 163 mg/dL (ref 0–200)
HDL: 24 mg/dL — ABNORMAL LOW (ref >40)
LDL Cholesterol: 111 mg/dL — ABNORMAL HIGH (ref 0–99)
Total CHOL/HDL Ratio: 6.8 RATIO
Triglycerides: 141 mg/dL (ref <150)
VLDL: 28 mg/dL (ref 0–40)

## 2022-10-03 LAB — LIPOPROTEIN A (LPA): Lipoprotein (a): 112.8 nmol/L — ABNORMAL HIGH

## 2022-10-03 LAB — T4, FREE: Free T4: 1.07 ng/dL (ref 0.61–1.12)

## 2022-10-03 MED ORDER — APIXABAN 5 MG PO TABS: 5.0000 mg | ORAL_TABLET | Freq: Two times a day (BID) | ORAL | Status: AC

## 2022-10-03 MED ORDER — PANTOPRAZOLE SODIUM 40 MG PO TBEC
40.0000 mg | DELAYED_RELEASE_TABLET | Freq: Every day | ORAL | Status: DC
  Administered 2022-10-03: 40 mg via ORAL
  Filled 2022-10-03: qty 1

## 2022-10-03 MED ORDER — METOPROLOL SUCCINATE ER 25 MG PO TB24: 25.0000 mg | ORAL_TABLET | Freq: Every day | ORAL | Status: AC

## 2022-10-03 MED ORDER — APIXABAN 5 MG PO TABS
5.0000 mg | ORAL_TABLET | Freq: Two times a day (BID) | ORAL | Status: DC
  Administered 2022-10-03: 5 mg via ORAL
  Filled 2022-10-03: qty 1

## 2022-10-03 MED ORDER — ISOSORBIDE MONONITRATE ER 30 MG PO TB24: 15.0000 mg | ORAL_TABLET | Freq: Every day | ORAL | Status: AC

## 2022-10-03 MED ORDER — AMLODIPINE BESYLATE 5 MG PO TABS: 5.0000 mg | ORAL_TABLET | Freq: Every day | ORAL | Status: AC

## 2022-10-03 MED ORDER — NITROGLYCERIN 0.4 MG SL SUBL: 0.4000 mg | SUBLINGUAL_TABLET | SUBLINGUAL | 12 refills | Status: AC | PRN

## 2022-10-03 MED ORDER — PANTOPRAZOLE SODIUM 40 MG PO TBEC: 40.0000 mg | DELAYED_RELEASE_TABLET | Freq: Every day | ORAL | Status: DC

## 2022-10-03 MED ORDER — METOPROLOL SUCCINATE ER 25 MG PO TB24
25.0000 mg | ORAL_TABLET | Freq: Every day | ORAL | Status: DC
  Administered 2022-10-03: 25 mg via ORAL
  Filled 2022-10-03: qty 1

## 2022-10-03 MED ORDER — CLOPIDOGREL BISULFATE 75 MG PO TABS: 75.0000 mg | ORAL_TABLET | Freq: Every day | ORAL | Status: AC

## 2022-10-03 MED FILL — Nitroglycerin IV Soln 100 MCG/ML in D5W: INTRA_ARTERIAL | Qty: 10 | Status: AC

## 2022-10-03 NOTE — Progress Notes (Signed)
Per d/w Dr. Bjorn Pippin, Toprol added to treatment regimen and ASA discontinued.

## 2022-10-03 NOTE — Progress Notes (Addendum)
PROGRESS NOTE William Hunter  NWG:[id number] DOB: 1946/03/04 DOA: 09/29/2022 PCP: Irena Reichmann, DO  Brief Narrative/Hospital Course: 77 year old male with history of DVT/PE in 2021, has not had Eliquis since 2 weeks-prescription expired, history of CAD/coronary artery spasm had cardiac cath >>> vasospasm, hypertension, migraine diabetes mellitus,rheumatoid arthritis, slipped vertebral cervical disc presenting with chest pain over the last few weeks. He woke up from sleep earlier this a.m. and episode of chest pain, his usual angina with associated sweating lasting for an hour and resolved.  Pain lasted for 2 hours today and came to ED, In ED: temp 100.9, heart rate 113 BP in 140s to 160s systolic, saturating on room air Labs showed mild hyponatremia, hypokalemia with a stable renal function, troponin elevated at 534 357>, lactic acid 1.9, normal CBC, D-dimer 0.97.Reviewed chest x-ray clear.  CT angio chest no evidence of PE or acute intrathoracic finding, possible esophagitis, aortic and coronary artery atherosclerosis noted Cardiology was consulted, aspirin 81 placed on heparin drip and admission was requested for further management. Patient was admitted for NSTEMI Underwent left heart cath that showed severe three-vessel disease Seen by cardio thoracic surgery> but given his comorbidities felt that he would have serious limitations for recovery following CABG, although limited revascularization best option is probably PCI and medical therapy per CTVS. Underwent PCI with DES to M LAD, and patient is not on Plavix and also Eliquis. PT OT working with him and recommending skilled nursing facility.  Subjective: Seen and examined this morning. Feels tired but no chest pain shortness of breath, reports brownish stool no bleeding.    Assessment and Plan: Principal Problem:   Non-ST elevation (NSTEMI) myocardial infarction Active Problems:   Coronary artery disease   Hypertension   Dyslipidemia    Coronary vasospasm ([name]   History of pulmonary embolism   Chest pain   Elevated troponin   NSTEMI CAD: Underwent left heart cath that showed severe three-vessel disease>CTVS consulted> not felt to be a candidate for CABG so underwent PCI with DES to mLAD 4/24, now on Plavix and also Eliquis Aspirin, Imdur.  Echo with EF 60 to 65%, not on statin due to intolerance will need outpatient lipid clinic referral.Not on beta-blocker due to remote basis spasm.  Hypertension:BP stable now unclear why very high 1 time early this morning.Continue amlodipine and Imdur.  Dyslipidemia:Intolerant to statins.    Esophagitis per CT scan:Continue PPI   Low-grade fever:Unclear etiology respiratory virus panel unremarkable chest x-ray and CT imaging no acute lung findings.  Resolved no recurrence.   Hypokalemia resolved   History of PE/DVT:positive D-dimer: Now on heparin drip,D-dimer slightly up> continue anticoagulation per cardiology   ?Blood in pad-FOBT+:Hemoglobin remains stable no drop.Patient denies any bleeding,reports having brown stool. Reports he had colonoscopy and endoscopy 9 years ago and unremarkable> monitor closely if no active bleeding we will do GI referral on dc. Recent Labs  Lab 09/29/22 0654 09/30/22 0241 10/01/22 0307 10/02/22 0218 10/03/22 0324  HGB 14.8 13.9 13.4 12.9* 13.4  HCT 42.5 39.9 39.7 36.3* 38.2*    T2DM: A1c 6.7, blood sugar poorly poorly controlled continue SSI.   Recent Labs  Lab 09/29/22 1926 09/30/22 0841 10/02/22 1317 10/02/22 1633 10/02/22 2103 10/03/22 0720 10/03/22 0913  GLUCAP  --    < > 147* 198* 177* 191* 246*  HGBA1C 6.7*  --   --   --   --   --   --    < > = values in this interval not displayed.  RA:On Humira and Plaquenil>followed by rheumatology as outpatient.   DVT prophylaxis: heparin gtt Code Status:   Code Status: Full Code I clarified CODE STATUS with him in an event of cardiorespiratory arrest he wants to be required but does not  want to be in vegetative state or in artificial life for 4 days> changed to full code Family Communication: plan of care discussed with patient at bedside.  Patient status YN:[id number]   Level of care: Progressive  Dispo:The patient is from:Home            Anticipated disposition: SNF once bed available and ok w/ SNF.  Objective: Vitals last 24 hrs: Vitals:   10/02/22 1634 10/02/22 1936 10/03/22 0435 10/03/22 0721  BP: 117/71 129/74 (!) 192/94 (!) 140/87  Pulse: 78 83 97 84  Resp: Temp: 98.1 F (36.7 C) 98.1 F (36.7 C) 99 F (37.2 C) 98 F (36.7 C)  TempSrc: Oral Oral Oral Oral  SpO2: 97% 98% 97% 97%  Weight:      Height:       Weight change:   Physical Examination: General exam: alert awake, oriented, pleasant, older than stated age HEENT:Oral mucosa moist, Ear/Nose WNL grossly Respiratory system: Bilaterally clear BS, no use of accessory muscle Cardiovascular system: S1 & S2 +, No JVD. Gastrointestinal system: Abdomen soft,NT,ND, BS+ Nervous System: Alert, awake, moving extremities, hefollows commands. Extremities: LE edema neg,distal peripheral pulses palpable.  Skin: No rashes,no icterus. MSK: Normal muscle bulk,tone, power  Medications reviewed:  Scheduled Meds:  amLODipine  5 mg Oral Daily   apixaban  5 mg Oral BID   aspirin EC  81 mg Oral Daily   carbidopa-levodopa  1 tablet Oral TID   clopidogrel  75 mg Oral Q breakfast   folic acid  1 mg Oral Daily   hydroxychloroquine  200 mg Oral Daily   insulin aspart  0-5 Units Subcutaneous QHS   insulin aspart  0-9 Units Subcutaneous TID WC   isosorbide mononitrate  15 mg Oral Daily   mirabegron ER  25 mg Oral Daily   pantoprazole  40 mg Oral Q0600   sodium chloride flush  3 mL Intravenous Q12H   sodium chloride flush  3 mL Intravenous Q12H   sodium chloride flush  3 mL Intravenous Q12H   sodium chloride flush  3 mL Intravenous Q12H   Continuous Infusions:  sodium chloride     sodium chloride       Diet Order             Diet Heart Room service appropriate? Yes; Fluid consistency: Thin  Diet effective now                  Intake/Output Summary (Last 24 hours) at 10/03/2022 1106 Last data filed at 10/03/2022 1005 Gross per 24 hour  Intake 444.32 ml  Output 3350 ml  Net -2905.68 ml   Net IO Since Admission: -4,067.79 mL [10/03/22 1106]  Wt Readings from Last 3 Encounters:  10/02/22 82 kg  09/26/22 78.5 kg  04/10/22 82.1 kg     Unresulted Labs (From admission, onward)     Start     Ordered   10/03/22 1011  T4, free  Add-on,   AD       Question:  Specimen collection method  Answer:  Lab=Lab collect   10/03/22 1010   10/01/22 0500  Basic metabolic panel  Daily,   R     Question:  Specimen collection  method  Answer:  Lab=Lab collect   09/30/22 1145   10/01/22 0500  CBC  Daily,   R     Question:  Specimen collection method  Answer:  Lab=Lab collect   09/30/22 1145   09/30/22 0500  Comprehensive metabolic panel  Daily,   R      09/29/22 1045   09/30/22 0500  CBC  Daily,   R      09/29/22 1045   09/29/22 0714  Urinalysis, Routine w reflex microscopic -Urine, Clean Catch  (Undifferentiated presentation (screening labs and basic nursing orders))  ONCE - URGENT,   URGENT       Question:  Specimen Source  Answer:  Urine, Clean Catch   09/29/22 0714          Data Reviewed: I have personally reviewed following labs and imaging studies CBC: Recent Labs  Lab 09/29/22 0654 09/30/22 0241 10/01/22 0307 10/02/22 0218 10/03/22 0324  WBC 9.5 15.5* 9.2 8.6 9.0  NEUTROABS 8.1*  --   --   --   --   HGB 14.8 13.9 13.4 12.9* 13.4  HCT 42.5 39.9 39.7 36.3* 38.2*  MCV 90.4 91.3 92.1 89.4 88.6  PLT 277 218 258 247 285   Basic Metabolic Panel: Recent Labs  Lab 09/29/22 0654 09/30/22 0241 10/01/22 0307 10/02/22 0218 10/03/22 0324  NA 134* 134* 134* 133* 132*  K 3.4* 3.6 3.3* 3.7 3.8  CL 101 97* 99 102 100  CO2 24 22 25 23  21*  GLUCOSE 185* 119* 170* 174* 205*   BUN 21 12 15 20 13   CREATININE 0.90 0.80 0.85 0.81 0.79  CALCIUM 8.5* 8.7* 8.6* 8.2* 8.5*   GFR: Estimated Creatinine Clearance: 83.6 mL/min (by C-G formula based on SCr of 0.79 mg/dL). Liver Function Tests: Recent Labs  Lab 09/29/22 0654 09/30/22 0241 10/01/22 0307 10/02/22 0218 10/03/22 0324  AST 22 17 16  14* 25  ALT 19 5 5 7 6   ALKPHOS 59 62 61 58 65  BILITOT 0.6 0.8 0.6 0.6 0.6  PROT 7.4 6.6 6.7 6.5 6.8  ALBUMIN 3.1* 2.7* 2.5* 2.5* 2.7*   No results for input(s): "LIPASE", "AMYLASE" in the last 168 hours. No results for input(s): "AMMONIA" in the last 168 hours. Coagulation Profile: Recent Labs  Lab 09/29/22 0654  INR 1.0   BNP (last 3 results) No results for input(s): "PROBNP" in the last 8760 hours. HbA1C: No results for input(s): "HGBA1C" in the last 72 hours.  CBG: Recent Labs  Lab 10/02/22 1317 10/02/22 1633 10/02/22 2103 10/03/22 0720 10/03/22 0913  GLUCAP 147* 198* 177* 191* 246*   Lipid Profile: Recent Labs    10/03/22 0324  CHOL 163  HDL 24*  LDLCALC 111*  TRIG 141  CHOLHDL 6.8   Thyroid Function Tests: No results for input(s): "TSH", "T4TOTAL", "FREET4", "T3FREE", "THYROIDAB" in the last 72 hours.  Sepsis Labs: Recent Labs  Lab 09/29/22 0654 09/29/22 1926  LATICACIDVEN 1.9 1.5    Recent Results (from the past 240 hour(s))  Blood Culture (routine x 2)     Status: None (Preliminary result)   Collection Time: 09/29/22  6:55 AM   Specimen: BLOOD RIGHT FOREARM  Result Value Ref Range Status   Specimen Description BLOOD RIGHT FOREARM  Final   Special Requests   Final    BOTTLES DRAWN AEROBIC AND ANAEROBIC Blood Culture adequate volume   Culture   Final    NO GROWTH 4 DAYS Performed at [facility], [address], [place], [place] [address]    Report Status PENDING  Incomplete  Blood Culture (routine x 2)     Status: None (Preliminary result)   Collection Time: 09/29/22  8:56 AM   Specimen: BLOOD  Result Value Ref Range  Status   Specimen Description BLOOD SITE NOT SPECIFIED  Final   Special Requests   Final    BOTTLES DRAWN AEROBIC AND ANAEROBIC Blood Culture results may not be optimal due to an inadequate volume of blood received in culture bottles   Culture   Final    NO GROWTH 4 DAYS Performed at [facility], [address], [place], [place] [address]    Report Status PENDING  Incomplete  Respiratory (~20 pathogens) panel by PCR     Status: None   Collection Time: 09/29/22  5:38 PM   Specimen: Nasopharyngeal Swab; Respiratory  Result Value Ref Range Status   Adenovirus NOT DETECTED NOT DETECTED Final   Coronavirus 229E NOT DETECTED NOT DETECTED Final    Comment: (NOTE) The Coronavirus on the Respiratory Panel, DOES NOT test for the novel  Coronavirus (2019 nCoV)    Coronavirus HKU1 NOT DETECTED NOT DETECTED Final   Coronavirus NL63 NOT DETECTED NOT DETECTED Final   Coronavirus OC43 NOT DETECTED NOT DETECTED Final   Metapneumovirus NOT DETECTED NOT DETECTED Final   Rhinovirus / Enterovirus NOT DETECTED NOT DETECTED Final   Influenza A NOT DETECTED NOT DETECTED Final   Influenza B NOT DETECTED NOT DETECTED Final   Parainfluenza Virus 1 NOT DETECTED NOT DETECTED Final   Parainfluenza Virus 2 NOT DETECTED NOT DETECTED Final   Parainfluenza Virus 3 NOT DETECTED NOT DETECTED Final   Parainfluenza Virus 4 NOT DETECTED NOT DETECTED Final   Respiratory Syncytial Virus NOT DETECTED NOT DETECTED Final   Bordetella pertussis NOT DETECTED NOT DETECTED Final   Bordetella Parapertussis NOT DETECTED NOT DETECTED Final   Chlamydophila pneumoniae NOT DETECTED NOT DETECTED Final   Mycoplasma pneumoniae NOT DETECTED NOT DETECTED Final    Comment: Performed at [facility], [address], [place], [place] [address]    Antimicrobials: Anti-infectives (From admission, onward)    Start     Dose/Rate Route Frequency Ordered Stop   09/29/22 1430  hydroxychloroquine (PLAQUENIL) tablet 200 mg         200 mg Oral Daily 09/29/22 1421        Culture/Microbiology    Component Value Date/Time   SDES BLOOD SITE NOT SPECIFIED 09/29/2022 0856   SPECREQUEST  09/29/2022 0856    BOTTLES DRAWN AEROBIC AND ANAEROBIC Blood Culture results may not be optimal due to an inadequate volume of blood received in culture bottles   CULT  09/29/2022 0856    NO GROWTH 4 DAYS Performed at [facility], [address] [place], [place] [address]    REPTSTATUS PENDING 09/29/2022 3086  Radiology Studies: CARDIAC CATHETERIZATION  Addendum Date: 10/02/2022     Mid LAD lesion is 75% stenosed.  A drug-eluting stent was successfully placed using a SYNERGY XD 3.50X38, postdilated to 3.75 mm and optimized with intravascular ultrasound.   Post intervention, there is a 0% residual stenosis.   Ramus-2 lesion is 75% stenosed.   Ramus-1 lesion is 80% stenosed.   Ost Cx to Prox Cx lesion is 50% stenosed- eccentric, most notable in caudal view.   Mid LM to Ost LAD lesion is 40% stenosed.  Smallest Cross-sectional area 6.6 mm by intravascular ultrasound, not significant.   Suezanne Jacquet  LAD to Prox LAD lesion is 50% stenosed.  Cross-sectional area 4.2 mm, not significant, by intravascular ultrasound for a very short segment, just before the left main.  The remainder of the LAD cross-sectional area is significantly larger.   A drug-eluting stent was successfully placed using a SYNERGY XD 3.50X38. Continue dual antiplatelet therapy along with aggressive secondary prevention.  Okay to switch to Brilinta if that has less drug interactions with his other neurologic medicines.  Will check with pharmacy prior to discharge.  Result Date: 10/02/2022   Mid LAD lesion is 75% stenosed.  A drug-eluting stent was successfully placed using a SYNERGY XD 3.50X38, postdilated to 3.75 mm and optimized with intravascular ultrasound.   Post intervention, there is a 0% residual stenosis.   Ramus-2 lesion is 75% stenosed.   Ramus-1 lesion is 80% stenosed.    Ost Cx to Prox Cx lesion is 50% stenosed- eccentric, most notable in caudal view.   Mid LM to Ost LAD lesion is 40% stenosed.  Smallest Cross-sectional area 6.6 mm by intravascular ultrasound.   Ost LAD to Prox LAD lesion is 50% stenosed.  Cross-sectional area 4.2 mm by intravascular ultrasound for a very short segment, just before the left main.  The remainder of the LAD cross-sectional area is significantly   A drug-eluting stent was successfully placed using a SYNERGY XD 3.50X38. Continue dual antiplatelet therapy along with aggressive secondary prevention.  Okay to switch to Brilinta if that has less drug interactions with his other neurologic medicines.  Will check with pharmacy prior to discharge.     LOS: 3 days   Lanae Boast, MD [facility]  10/03/2022, 11:06 AM

## 2022-10-03 NOTE — TOC Progression Note (Addendum)
Transition of Care [facility]) - Progression Note    Patient Details  Name: William Hunter MRN: [id number] Date of Birth: 12/21/1945  Transition of Care [facility]) CM/SW Contact  Delilah Shan, LCSWA Phone Number: 10/03/2022, 11:11 AM  Clinical Narrative:     Update- CSW received insurance authorization approval Plan Auth ID# [id number] Auth ID# [id number]. Insurance authorization has been approved from 4/25-4/29. Nicki confirmed she can accept patient today if medically ready. CSW informed MD.  CSW spoke with patient at bedside and provided SNF bed offers. Patient accepted SNF bed offer with [facility]. CSW spoke with [facility] with [facility] who confirmed SNF bed for patient.CSW added facility to patients insurance authorization.Patients insurance called CSW and requested additional clinicals current OT note. CSW uploaded requested clinical to [employer]. CSW awaiting insurance authorization determination. CSW informed MD. CSW will continue to  follow and assist with patients dc planning needs.    Barriers to Discharge: Continued Medical Work up  Expected Discharge Plan and Services In-house Referral: Clinical Social Work     Living arrangements for the past 2 months: Single Family Home                                       Social Determinants of Health (SDOH) Interventions SDOH Screenings   Food Insecurity: No Food Insecurity (09/29/2022)  Housing: Low Risk  (09/29/2022)  Transportation Needs: No Transportation Needs (09/29/2022)  Utilities: Not At Risk (09/29/2022)  Tobacco Use: Low Risk  (10/03/2022)    Readmission Risk Interventions     No data to display

## 2022-10-03 NOTE — TOC Transition Note (Signed)
Transition of Care [facility]) - CM/SW Discharge Note   Patient Details  Name: William Hunter MRN: [id number] Date of Birth: March 05, 1946  Transition [facility] Care [facility]) CM/SW Contact:  Delilah Shan, LCSWA Phone Number: 10/03/2022, 1:41 PM   Clinical Narrative:     Patient will DC to: [facility]  Anticipated DC date: 10/03/2022  Family notified: Talbert Forest   Transport by: Sharin Mons   ?  Per MD patient ready for DC to [facility] . RN, patient, patient's family, and facility notified of DC. Discharge Summary sent to facility. RN given number for report tele# [phone] RM# [phone]. DC packet on chart. Ambulance transport requested for patient.  CSW signing off.    Final next level of care: Skilled Nursing Facility Barriers to Discharge: No Barriers Identified   Patient Goals and CMS Choice CMS Medicare.gov Compare Post Acute Care list provided to:: Patient Choice offered to / list presented to : Patient  Discharge Placement                Patient chooses bed at: [facility] Patient to be transferred to facility by: [facility] Name of family member notified: Talbert Forest Patient and family notified of of transfer: 10/03/22  Discharge Plan and Services Additional resources added to the After Visit Summary for   In-house Referral: Clinical Social Work                                   Social Determinants of Health (SDOH) Interventions SDOH Screenings   Food Insecurity: No Food Insecurity (09/29/2022)  Housing: Low Risk  (09/29/2022)  Transportation Needs: No Transportation Needs (09/29/2022)  Utilities: Not At Risk (09/29/2022)  Tobacco Use: Low Risk  (10/03/2022)     Readmission Risk Interventions     No data to display

## 2022-10-03 NOTE — Progress Notes (Signed)
Physical Therapy Treatment Patient Details Name: William Hunter MRN: [id number] DOB: 15-May-1946 Today's Date: 10/03/2022   History of Present Illness Pt is 77 year old presented to [facility] on  09/29/22 for chest pain. Pt with NSTEMI. Cardiac cath 4/22 showed severe multivessel CAD. 4/24 s/p PCI with DES to M LAD. Plans for stent on 10/02/22. PMH - OA, CAD, HTN, RA, DMII, DVT/PE.    PT Comments    Pt received in supine, agreeable to therapy session with emphasis on bed mobility, BLE strengthening and seated balance. Pt noted to be incontinent of bowels upon rolling and needing up to maxA +1 for rolling fully onto either side and needed totalA for hygiene assist in sidelying. Pt receptive to instruction on no heavy pulling with LUE for 5 days post-LHC. Pt needing up to +2 maxA to perform log roll to L EOB and was too dizzy/fatigued to assist with lateral seated scooting for transfers. TotalA +2 for return to supine due to posterior LOB and pt unable to assist with returning BLE to edge of bed. Pt attempting to assist with posterior supine scooting toward [facility] with RUE and BLE assist. Pt continues to benefit from PT services to progress toward functional mobility goals.   Recommendations for follow up therapy are one component of a multi-disciplinary discharge planning process, led by the attending physician.  Recommendations may be updated based on patient status, additional functional criteria and insurance authorization.  Follow Up Recommendations  Can patient physically be transported by private vehicle: No    Assistance Recommended at Discharge Frequent or constant Supervision/Assistance  Patient can return home with the following Two people to help with walking and/or transfers;A lot of help with bathing/dressing/bathroom;Assist for transportation;Assistance with cooking/housework;Help with stairs or ramp for entrance   Equipment Recommendations  None recommended by PT (TBD)    Recommendations for  Other Services       Precautions / Restrictions Precautions Precautions: Fall;Other (comment) Precaution Comments: L heart cath, bowel/bladder incontinence Restrictions Weight Bearing Restrictions: No Other Position/Activity Restrictions: LUE no push/pull/lift >10lb for 5 days post LHC (off precs 4/29)     Mobility  Bed Mobility Overal bed mobility: Needs Assistance Bed Mobility: Rolling, Sidelying to Sit, Sit to Supine Rolling: Max assist, Mod assist Sidelying to sit: Max assist, +2 for safety/equipment   Sit to supine: Total assist, +2 for safety/equipment   General bed mobility comments: Assist for BLEs, and rolling d/t L heart cath. Cues to utilize RUE to assist with Supine>sit. Pt dizzy and posterior lean upon sitting upright and unable to tolerate >2 minutes, +2 assist for return to supine via helicopter technique.    Transfers                   General transfer comment: Deferred. Pt with poor to zero seated balance at EOB and reports increased dizziness, unable to tolerate seated lateral scooting or standing trials.    Ambulation/Gait                   Stairs             Wheelchair Mobility    Modified Rankin (Stroke Patients Only)       Balance Overall balance assessment: Needs assistance Sitting-balance support: Bilateral upper extremity supported, Feet supported Sitting balance-Leahy Scale: Zero Sitting balance - Comments: Pt poor to zero EOB static sitting, unable to maintain >1.5-2 mins due to c/o dizziness and heavy posterior bias. Postural control: Posterior lean  Standing balance comment: NT                            Cognition Arousal/Alertness: Awake/alert Behavior During Therapy: Flat affect Overall Cognitive Status: Within Functional Limits for tasks assessed                                 General Comments: pt reports b/b incontinence and dizziness have been an issue "for a couple weeks".         Exercises Other Exercises Other Exercises: supine BLE A/AAROM: ankle pumps, heel slides x10 reps ea Other Exercises: bridges x5 reps, single leg hip extension (knees bent as a partial bridge) x5 reps in each direction to simulate partial roll for strengthening for improved independence in self-care.    General Comments General comments (skin integrity, edema, etc.): bed chair posture BP 99/62 (75) HR 82 bpm after return to bed; unable to assess seated when lightheaded due to need to assist pt with balance. Pt may benefit from TED hose/LE compression to see if this helps improve hemodynamic stability.      Pertinent Vitals/Pain Pain Assessment Pain Assessment: Faces Pain Score: 0-No pain Pain Intervention(s): Monitored during session, Repositioned    Home Living   Prior Function    PT Goals (current goals can now be found in the care plan section) Acute Rehab PT Goals Patient Stated Goal: Go to rehab PT Goal Formulation: With patient Time For Goal Achievement: 10/15/22 Progress towards PT goals: Progressing toward goals    Frequency    Min 1X/week      PT Plan Current plan remains appropriate    Co-evaluation              AM-PAC PT "6 Clicks" Mobility   Outcome Measure  Help needed turning from your back to your side while in a flat bed without using bedrails?: A Lot Help needed moving from lying on your back to sitting on the side of a flat bed without using bedrails?: Total Help needed moving to and from a bed to a chair (including a wheelchair)?: Total Help needed standing up from a chair using your arms (e.g., wheelchair or bedside chair)?: Total Help needed to walk in hospital room?: Total Help needed climbing 3-5 steps with a railing? : Total 6 Click Score: 7    End of Session   Activity Tolerance: Patient limited by fatigue;Treatment limited secondary to medical complications (Comment);Other (comment) (orthostatic symptoms with sitting) Patient  left: in bed;with call bell/phone within reach;with bed alarm set;Other (comment) (bed in chair posture) Nurse Communication: Mobility status;Need for lift equipment;Other (comment) (pt incontinence) PT Visit Diagnosis: Other abnormalities of gait and mobility (R26.89);Unsteadiness on feet (R26.81);Muscle weakness (generalized) (M62.81);History of falling (Z91.81)     Time: 1610-9604 PT Time Calculation (min) (ACUTE ONLY): 32 min  Charges:  $Therapeutic Exercise: 8-22 mins $Therapeutic Activity: 8-22 mins                     Thatiana Renbarger P., PTA Acute Rehabilitation Services Secure Chat Preferred 9a-5:30pm Office: [phone]    Dorathy Kinsman [facility] [place] 10/03/2022, 4:19 PM

## 2022-10-03 NOTE — Progress Notes (Signed)
CARDIAC REHAB PHASE I   Pt resting in bed feeling well this morning. Post MI/stent education including site care, risk factors, restrictions, exercise guidelines, antiplatelet therapy importance, MI booklet, heart healthy diabetic diet and CRP2 reviewed. All questions and concerns addressed. Will refer to [facility] for CRP2. Pt has ongoing mobility limitations. Plan for possible rehab/SNF at discharge. Pt verbalized interest in CRP2, if he feels strong enough once home from SNF.  PT/OT assisting with current mobility needs.  Will continue to follow.   [id number] Woodroe Chen, RN BSN 10/03/2022 9:44 AM

## 2022-10-03 NOTE — Care Management Important Message (Signed)
Important Message  Patient Details  Name: William Hunter MRN: [id number] Date of Birth: Jul 08, 1945   Medicare Important Message Given:  Yes     Renie Ora 10/03/2022, 10:26 AM

## 2022-10-03 NOTE — Plan of Care (Signed)
Problem: Education: Goal: Understanding of cardiac disease, CV risk reduction, and recovery process will improve Outcome: Adequate for Discharge Goal: Individualized Educational Video(s) Outcome: Adequate for Discharge   Problem: Activity: Goal: Ability to tolerate increased activity will improve Outcome: Adequate for Discharge   Problem: Cardiac: Goal: Ability to achieve and maintain adequate cardiovascular perfusion will improve Outcome: Adequate for Discharge   Problem: Health Behavior/Discharge Planning: Goal: Ability to safely manage health-related needs after discharge will improve Outcome: Adequate for Discharge   Problem: Education: Goal: Understanding of CV disease, CV risk reduction, and recovery process will improve Outcome: Adequate for Discharge Goal: Individualized Educational Video(s) Outcome: Adequate for Discharge   Problem: Activity: Goal: Ability to return to baseline activity level will improve Outcome: Adequate for Discharge   Problem: Cardiovascular: Goal: Ability to achieve and maintain adequate cardiovascular perfusion will improve Outcome: Adequate for Discharge Goal: Vascular access site(s) Level 0-1 will be maintained Outcome: Adequate for Discharge   Problem: Health Behavior/Discharge Planning: Goal: Ability to safely manage health-related needs after discharge will improve Outcome: Adequate for Discharge   Problem: Education: Goal: Knowledge of General Education information will improve Description: Including pain rating scale, medication(s)/side effects and non-pharmacologic comfort measures Outcome: Adequate for Discharge   Problem: Health Behavior/Discharge Planning: Goal: Ability to manage health-related needs will improve Outcome: Adequate for Discharge   Problem: Clinical Measurements: Goal: Ability to maintain clinical measurements within normal limits will improve Outcome: Adequate for Discharge Goal: Will remain free from  infection Outcome: Adequate for Discharge Goal: Diagnostic test results will improve Outcome: Adequate for Discharge Goal: Respiratory complications will improve Outcome: Adequate for Discharge Goal: Cardiovascular complication will be avoided Outcome: Adequate for Discharge   Problem: Activity: Goal: Risk for activity intolerance will decrease Outcome: Adequate for Discharge   Problem: Nutrition: Goal: Adequate nutrition will be maintained Outcome: Adequate for Discharge   Problem: Coping: Goal: Level of anxiety will decrease Outcome: Adequate for Discharge   Problem: Elimination: Goal: Will not experience complications related to bowel motility Outcome: Adequate for Discharge Goal: Will not experience complications related to urinary retention Outcome: Adequate for Discharge   Problem: Pain Managment: Goal: General experience of comfort will improve Outcome: Adequate for Discharge   Problem: Safety: Goal: Ability to remain free from injury will improve Outcome: Adequate for Discharge   Problem: Skin Integrity: Goal: Risk for impaired skin integrity will decrease Outcome: Adequate for Discharge   Problem: Education: Goal: Ability to describe self-care measures that may prevent or decrease complications (Diabetes Survival Skills Education) will improve Outcome: Adequate for Discharge Goal: Individualized Educational Video(s) Outcome: Adequate for Discharge   Problem: Coping: Goal: Ability to adjust to condition or change in health will improve Outcome: Adequate for Discharge   Problem: Fluid Volume: Goal: Ability to maintain a balanced intake and output will improve Outcome: Adequate for Discharge   Problem: Health Behavior/Discharge Planning: Goal: Ability to identify and utilize available resources and services will improve Outcome: Adequate for Discharge Goal: Ability to manage health-related needs will improve Outcome: Adequate for Discharge   Problem:  Metabolic: Goal: Ability to maintain appropriate glucose levels will improve Outcome: Adequate for Discharge   Problem: Nutritional: Goal: Maintenance of adequate nutrition will improve Outcome: Adequate for Discharge Goal: Progress toward achieving an optimal weight will improve Outcome: Adequate for Discharge   Problem: Skin Integrity: Goal: Risk for impaired skin integrity will decrease Outcome: Adequate for Discharge   Problem: Tissue Perfusion: Goal: Adequacy of tissue perfusion will improve Outcome: Adequate for Discharge   

## 2022-10-03 NOTE — Discharge Summary (Signed)
Physician Discharge Summary  William Hunter:[id number] DOB: Feb 15, 1946 DOA: 09/29/2022  PCP: Irena Reichmann, DO  Admit date: 09/29/2022 Discharge date: 10/03/2022 Recommendations for Outpatient Follow-up:  Follow up with PCP in 1 weeks-call for appointment Please obtain BMP/CBC in one week Follow-up with cardiology Follow-up with [facility] gastroenterology due to FOBT positive as outpatient  Discharge Dispo: SNF Discharge Condition: Stable Code Status:   Code Status: Full Code Diet recommendation:  Diet Order             Diet Heart Room service appropriate? Yes; Fluid consistency: Thin  Diet effective now                   Brief/Interim Summary: 77 year old male with history of DVT/PE in 2021, has not had Eliquis since 2 weeks-prescription expired, history of CAD/coronary artery spasm had cardiac cath >>> vasospasm, hypertension, migraine diabetes mellitus,rheumatoid arthritis, slipped vertebral cervical disc presenting with chest pain over the last few weeks. He woke up from sleep earlier this a.m. and episode of chest pain, his usual angina with associated sweating lasting for an hour and resolved.  Pain lasted for 2 hours today and came to ED, In ED: temp 100.9, heart rate 113 BP in 140s to 160s systolic, saturating on room air Labs showed mild hyponatremia, hypokalemia with a stable renal function, troponin elevated at 534 357>, lactic acid 1.9, normal CBC, D-dimer 0.97.Reviewed chest x-ray clear.  CT angio chest no evidence of PE or acute intrathoracic finding, possible esophagitis, aortic and coronary artery atherosclerosis noted Cardiology was consulted, aspirin 81 placed on heparin drip and admission was requested for further management. Patient was admitted for NSTEMI Underwent left heart cath that showed severe three-vessel disease Seen by cardio thoracic surgery> but given his comorbidities felt that he would have serious limitations for recovery following CABG, although  limited revascularization best option is probably PCI and medical therapy per [facility]. Underwent PCI with DES to M LAD, and patient is now on Plavix and Eliquis, toprol started 10/03/22. PT OT working with him and recommending skilled nursing facility.    Discharge Diagnoses:  Principal Problem:   Non-ST elevation (NSTEMI) myocardial infarction Active Problems:   Coronary artery disease   Hypertension   Dyslipidemia   Coronary vasospasm ([name]   History of pulmonary embolism   Chest pain   Elevated troponin  NSTEMI CAD: Underwent left heart cath that showed severe three-vessel disease>CTVS consulted> not felt to be a candidate for CABG so underwent PCI with DES to mLAD 4/24, now on Plavix , Eliquis, Imdur.  Aspirin discontinued, Toprol added.  Echo with EF 60 to 65%, not on statin due to intolerance will need outpatient lipid clinic referral. He will follow-up with outpatient cardiology    Hypertension:BP stable now unclear why very high 1 time early this morning.Continue amlodipine and Imdur.   Dyslipidemia:Intolerant to statins.     Esophagitis per CT scan:Continue PPI   Low-grade fever:Unclear etiology respiratory virus panel unremarkable chest x-ray and CT imaging no acute lung findings.  Resolved no recurrence.   Hypokalemia resolved   History of PE/DVT:positive D-dimer: Now on heparin drip,D-dimer slightly up> continue anticoagulation per cardiology    ?Blood in pad-FOBT+:Hemoglobin remains stable no drop.Patient denies any bleeding,reports having brown stool. Reports he had colonoscopy and endoscopy 9 years ago and unremarkable> monitor closely if no active bleeding we will do GI referral on dc> HH is improved to 13.4  T2DM: A1c 6.7, blood sugar poorly poorly controlled continue SSI.  RA:On Humira and Plaquenil>followed by rheumatology as outpatient.  Consults: Cardiology, CT surgery Subjective: Resting well no complaints.  Agreeable for discharge to skilled nursing  facility today, discussed currently send K.  Discharge Exam: Vitals:   10/03/22 0721 10/03/22 1241  BP: (!) 140/87 109/65  Pulse: 84 84  Resp: 20   Temp: 98 F (36.7 C)   SpO2: 97%    General: Pt is alert, awake, not in acute distress Cardiovascular: RRR, S1/S2 +, no rubs, no gallops Respiratory: CTA bilaterally, no wheezing, no rhonchi Abdominal: Soft, NT, ND, bowel sounds + Extremities: no edema, no cyanosis  Discharge Instructions  Discharge Instructions     Amb Referral to Cardiac Rehabilitation   Complete by: As directed    Diagnosis:  Coronary Stents NSTEMI     After initial evaluation and assessments completed: Virtual Based Care may be provided alone or in conjunction with Phase 2 Cardiac Rehab based on patient barriers.: Yes   Intensive Cardiac Rehabilitation (ICR) [facility] location only OR Traditional Cardiac Rehabilitation (TCR) *If criteria for ICR are not met will enroll in TCR [facility] only): Yes   Discharge instructions   Complete by: As directed    Please check CBC BMP in 1 week.  Please call call MD or return to ER for similar or worsening recurring problem that brought you to hospital or if any fever,nausea/vomiting,abdominal pain, uncontrolled pain, chest pain,  shortness of breath or any other alarming symptoms.  Please follow-up your doctor as instructed in a week time and call the office for appointment.  Please avoid alcohol, smoking, or any other illicit substance and maintain healthy habits including taking your regular medications as prescribed.  You were cared for by a hospitalist during your hospital stay. If you have any questions about your discharge medications or the care you received while you were in the hospital after you are discharged, you can call the unit and ask to speak with the hospitalist on call if the hospitalist that took care of you is not available.  Once you are discharged, your primary care physician will handle any further medical  issues. Please note that NO REFILLS for any discharge medications will be authorized once you are discharged, as it is imperative that you return to your primary care physician (or establish a relationship with a primary care physician if you do not have one) for your aftercare needs so that they can reassess your need for medications and monitor your lab values   Increase activity slowly   Complete by: As directed       Allergies as of 10/03/2022       Reactions   Codeine Swelling   Other reaction(s): rash   Penicillins Hives, Swelling   Has patient had a PCN reaction causing immediate rash, facial/tongue/throat swelling, SOB or lightheadedness with hypotension: Yes Has patient had a PCN reaction causing severe rash involving mucus membranes or skin necrosis: No Has patient had a PCN reaction that required hospitalization No Has patient had a PCN reaction occurring within the last 10 years: No If all of the above answers are "NO", then may proceed with Cephalosporin use.   Prednisone Hives   Other reaction(s): hives   Wool Alcohol [lanolin] Hives   Empagliflozin Nausea And Vomiting   Other reaction(s): nausea and vomiting   Penicillin G    Other reaction(s): rash   Statins    Other reaction(s): myopathy        Medication List     STOP  taking these medications    diltiazem 300 MG 24 hr capsule Commonly known as: TIAZAC       TAKE these medications    amLODipine 5 MG tablet Commonly known as: NORVASC Take 1 tablet (5 mg total) by mouth daily. Start taking on: October 04, 2022   apixaban 5 MG Tabs tablet Commonly known as: ELIQUIS Take 1 tablet (5 mg total) by mouth 2 (two) times daily. What changed:  how much to take how to take this when to take this additional instructions   BD Pen Needle Nano 2nd Gen 32G X 4 MM Misc Generic drug: Insulin Pen Needle   BD Pen Needle Micro U/F 32G X 6 MM Misc Generic drug: Insulin Pen Needle Inject into the skin 3 (three)  times daily.   carbidopa-levodopa 25-100 MG tablet Commonly known as: SINEMET IR Take 1 tablet by mouth 3 (three) times daily.   clopidogrel 75 MG tablet Commonly known as: PLAVIX Take 1 tablet (75 mg total) by mouth daily with breakfast. Start taking on: October 04, 2022   folic acid 1 MG tablet Commonly known as: FOLVITE Take 1 tablet (1 mg total) by mouth daily.   FreeStyle Libre 2 Sensor Misc Place onto the skin.   Gvoke HypoPen 2-Pack 1 MG/0.2ML Soaj Generic drug: Glucagon Inject into the skin.   Humira (2 Pen) 40 MG/0.4ML Pnkt Generic drug: Adalimumab Inject 0.4 mLs into the muscle every 14 (fourteen) days.   hydroxychloroquine 200 MG tablet Commonly known as: PLAQUENIL Take 1 tablet (200 mg total) by mouth daily.   insulin aspart 100 UNIT/ML injection Commonly known as: novoLOG 0-5 Units, Subcutaneous, Daily at bedtime: HS scale CBG < 70: implement hypoglycemia measures CBG 70 - 120: 0 units CBG 121 - 150: 0 units CBG 151 - 200: 0 units CBG 201 - 250: 2 units CBG 251 - 300: 3 units CBG 301 - 350: 4 units CBG 351 - 400: 5 units CBG > 400: call MD and obtain STAT lab verification   insulin aspart 100 UNIT/ML injection Commonly known as: novoLOG 0-15 Units, Subcutaneous, 3 times daily with meals CBG < 70: Implement Hypoglycemia Standing Orders and refer to Hypoglycemia Standing Orders sidebar report CBG 70 - 120: 0 units CBG 121 - 150: 2 units CBG 151 - 200: 3 units CBG 201 - 250: 5 units CBG 251 - 300: 8 units CBG 301 - 350: 11 units CBG 351 - 400: 15 units CBG > 400: call MD and obtain STAT lab verification   isosorbide mononitrate 30 MG 24 hr tablet Commonly known as: IMDUR Take 0.5 tablets (15 mg total) by mouth daily. Start taking on: October 04, 2022   meclizine 25 MG tablet Commonly known as: ANTIVERT Take 25 mg by mouth 3 (three) times daily as needed for dizziness.   methotrexate 2.5 MG tablet Commonly known as: RHEUMATREX Take 6 tablets (15 mg total) by  mouth once a week. Caution:Chemotherapy. Protect from light.   metoprolol succinate 25 MG 24 hr tablet Commonly known as: TOPROL-XL Take 1 tablet (25 mg total) by mouth daily.   Myrbetriq 25 MG Tb24 tablet Generic drug: mirabegron ER Take 25 mg by mouth daily.   nitroGLYCERIN 0.4 MG SL tablet Commonly known as: NITROSTAT Place 1 tablet (0.4 mg total) under the tongue every 5 (five) minutes x 3 doses as needed for chest pain.   ORION 4 inclisiran or placebo 300 mg/1.5 mL SQ injection Inject 1.5 mLs (300 mg total) into the skin  every 6 (six) months.   pantoprazole 40 MG tablet Commonly known as: PROTONIX Take 1 tablet (40 mg total) by mouth daily at 6 (six) AM.        Follow-up Information     Tereso Newcomer T, PA-C Follow up.   Specialties: Cardiology, Physician Assistant Why: [facility] - [address] location - cardiology follow-up has been moved up to Wednesday Oct 16, 2022 at 1:55 PM (Arrive by 1:40 PM). Our office will also contact you to schedule an appointment with our pharmacist to discuss options for cholesterol treatment. Contact information: [address] [place] [address] [phone]         Irena Reichmann, DO Follow up in 1 week(s).   Specialty: Family Medicine Contact information: [address] [place] [address] [phone]                Allergies  Allergen Reactions   Codeine Swelling    Other reaction(s): rash   Penicillins Hives and Swelling    Has patient had a PCN reaction causing immediate rash, facial/tongue/throat swelling, SOB or lightheadedness with hypotension: Yes Has patient had a PCN reaction causing severe rash involving mucus membranes or skin necrosis: No Has patient had a PCN reaction that required hospitalization No Has patient had a PCN reaction occurring within the last 10 years: No If all of the above answers are "NO", then may proceed with Cephalosporin use.    Prednisone  Hives    Other reaction(s): hives   Wool Alcohol [Lanolin] Hives   Empagliflozin Nausea And Vomiting    Other reaction(s): nausea and vomiting   Penicillin G     Other reaction(s): rash   Statins     Other reaction(s): myopathy    The results of significant diagnostics from this hospitalization (including imaging, microbiology, ancillary and laboratory) are listed below for reference.    Microbiology: Recent Results (from the past 240 hour(s))  Blood Culture (routine x 2)     Status: None (Preliminary result)   Collection Time: 09/29/22  6:55 AM   Specimen: BLOOD RIGHT FOREARM  Result Value Ref Range Status   Specimen Description BLOOD RIGHT FOREARM  Final   Special Requests   Final    BOTTLES DRAWN AEROBIC AND ANAEROBIC Blood Culture adequate volume   Culture   Final    NO GROWTH 4 DAYS Performed at [facility], [address], [place], [place] [address]    Report Status PENDING  Incomplete  Blood Culture (routine x 2)     Status: None (Preliminary result)   Collection Time: 09/29/22  8:56 AM   Specimen: BLOOD  Result Value Ref Range Status   Specimen Description BLOOD SITE NOT SPECIFIED  Final   Special Requests   Final    BOTTLES DRAWN AEROBIC AND ANAEROBIC Blood Culture results may not be optimal due to an inadequate volume of blood received in culture bottles   Culture   Final    NO GROWTH 4 DAYS Performed at [facility], [address], [place], [place] [address]    Report Status PENDING  Incomplete  Respiratory (~20 pathogens) panel by PCR     Status: None   Collection Time: 09/29/22  5:38 PM   Specimen: Nasopharyngeal Swab; Respiratory  Result Value Ref Range Status   Adenovirus NOT DETECTED NOT DETECTED Final   Coronavirus 229E NOT DETECTED NOT DETECTED Final    Comment: (NOTE) The Coronavirus on the Respiratory  Panel, DOES NOT test for the novel  Coronavirus (2019 nCoV)    Coronavirus HKU1 NOT DETECTED NOT DETECTED Final   Coronavirus NL63 NOT  DETECTED NOT DETECTED Final   Coronavirus OC43 NOT DETECTED NOT DETECTED Final   Metapneumovirus NOT DETECTED NOT DETECTED Final   Rhinovirus / Enterovirus NOT DETECTED NOT DETECTED Final   Influenza A NOT DETECTED NOT DETECTED Final   Influenza B NOT DETECTED NOT DETECTED Final   Parainfluenza Virus 1 NOT DETECTED NOT DETECTED Final   Parainfluenza Virus 2 NOT DETECTED NOT DETECTED Final   Parainfluenza Virus 3 NOT DETECTED NOT DETECTED Final   Parainfluenza Virus 4 NOT DETECTED NOT DETECTED Final   Respiratory Syncytial Virus NOT DETECTED NOT DETECTED Final   Bordetella pertussis NOT DETECTED NOT DETECTED Final   Bordetella Parapertussis NOT DETECTED NOT DETECTED Final   Chlamydophila pneumoniae NOT DETECTED NOT DETECTED Final   Mycoplasma pneumoniae NOT DETECTED NOT DETECTED Final    Comment: Performed at [facility], [address], [place], [place] [address]    Procedures/Studies: CARDIAC CATHETERIZATION  Addendum Date: 10/02/2022     Mid LAD lesion is 75% stenosed.  A drug-eluting stent was successfully placed using a SYNERGY XD 3.50X38, postdilated to 3.75 mm and optimized with intravascular ultrasound.   Post intervention, there is a 0% residual stenosis.   Ramus-2 lesion is 75% stenosed.   Ramus-1 lesion is 80% stenosed.   Ost Cx to Prox Cx lesion is 50% stenosed- eccentric, most notable in caudal view.   Mid LM to Ost LAD lesion is 40% stenosed.  Smallest Cross-sectional area 6.6 mm by intravascular ultrasound, not significant.   Ost LAD to Prox LAD lesion is 50% stenosed.  Cross-sectional area 4.2 mm, not significant, by intravascular ultrasound for a very short segment, just before the left main.  The remainder of the LAD cross-sectional area is significantly larger.   A drug-eluting stent was successfully placed using a SYNERGY XD 3.50X38. Continue dual antiplatelet therapy along with aggressive secondary prevention.  Okay to switch to Brilinta if that has less drug  interactions with his other neurologic medicines.  Will check with pharmacy prior to discharge.  Result Date: 10/02/2022   Mid LAD lesion is 75% stenosed.  A drug-eluting stent was successfully placed using a SYNERGY XD 3.50X38, postdilated to 3.75 mm and optimized with intravascular ultrasound.   Post intervention, there is a 0% residual stenosis.   Ramus-2 lesion is 75% stenosed.   Ramus-1 lesion is 80% stenosed.   Ost Cx to Prox Cx lesion is 50% stenosed- eccentric, most notable in caudal view.   Mid LM to Ost LAD lesion is 40% stenosed.  Smallest Cross-sectional area 6.6 mm by intravascular ultrasound.   Ost LAD to Prox LAD lesion is 50% stenosed.  Cross-sectional area 4.2 mm by intravascular ultrasound for a very short segment, just before the left main.  The remainder of the LAD cross-sectional area is significantly   A drug-eluting stent was successfully placed using a SYNERGY XD 3.50X38. Continue dual antiplatelet therapy along with aggressive secondary prevention.  Okay to switch to Brilinta if that has less drug interactions with his other neurologic medicines.  Will check with pharmacy prior to discharge.   ECHOCARDIOGRAM COMPLETE  Result Date: 09/30/2022    ECHOCARDIOGRAM REPORT   Patient Name:   William Hunter Date of Exam: 09/30/2022 Medical Rec #:  [id number]     Height:       70.0 in Accession #:  [id number]    Weight:       173.7 lb Date of Birth:  [birth year]     BSA:          1.966 m Patient Age:    41 years      BP:           106/65 mmHg Patient Gender: M             HR:           76 bpm. Exam Location:  Inpatient Procedure: 2D Echo, Cardiac Doppler and Color Doppler Indications:    Chest pain R07.9  History:        Patient has prior history of Echocardiogram examinations, most                 recent 11/27/2021. CAD, Signs/Symptoms:Chest Pain; Risk                 Factors:Dyslipidemia, Non-Smoker and Diabetes. H/O PE.  Sonographer:    Dondra Prader RVT Referring Phys: [id number] Parke Poisson   Sonographer Comments: Technically challenging study due to limited acoustic windows, Technically difficult study due to poor echo windows, suboptimal parasternal window, suboptimal apical window and suboptimal subcostal window. Patient had a hard time tolerating exam; declined Definity IMPRESSIONS  1. Left ventricular ejection fraction, by estimation, is 60 to 65%. The left ventricle has normal function. The left ventricle has no regional wall motion abnormalities. There is mild concentric left ventricular hypertrophy. Left ventricular diastolic parameters are consistent with Grade I diastolic dysfunction (impaired relaxation).  2. Right ventricular systolic function is normal. The right ventricular size is normal.  3. Left atrial size was mildly dilated.  4. The mitral valve is normal in structure. No evidence of mitral valve regurgitation. No evidence of mitral stenosis.  5. The aortic valve is tricuspid. There is mild calcification of the aortic valve. Aortic valve regurgitation is not visualized. Aortic valve sclerosis/calcification is present, without any evidence of aortic stenosis. Aortic valve mean gradient measures 3.0 mmHg. Aortic valve Vmax measures 1.13 m/s.  6. The inferior vena cava is normal in size with greater than 50% respiratory variability, suggesting right atrial pressure of 3 mmHg. FINDINGS  Left Ventricle: Left ventricular ejection fraction, by estimation, is 60 to 65%. The left ventricle has normal function. The left ventricle has no regional wall motion abnormalities. The left ventricular internal cavity size was normal in size. There is  mild concentric left ventricular hypertrophy. Left ventricular diastolic parameters are consistent with Grade I diastolic dysfunction (impaired relaxation). Right Ventricle: The right ventricular size is normal. No increase in right ventricular wall thickness. Right ventricular systolic function is normal. Left Atrium: Left atrial size was mildly dilated.  Right Atrium: Right atrial size was normal in size. Pericardium: There is no evidence of pericardial effusion. Mitral Valve: The mitral valve is normal in structure. No evidence of mitral valve regurgitation. No evidence of mitral valve stenosis. Tricuspid Valve: The tricuspid valve is normal in structure. Tricuspid valve regurgitation is trivial. No evidence of tricuspid stenosis. Aortic Valve: The aortic valve is tricuspid. There is mild calcification of the aortic valve. Aortic valve regurgitation is not visualized. Aortic valve sclerosis/calcification is present, without any evidence of aortic stenosis. Aortic valve mean gradient measures 3.0 mmHg. Aortic valve peak gradient measures 5.1 mmHg. Pulmonic Valve: The pulmonic valve was normal in structure. Pulmonic valve regurgitation is not visualized. No evidence of pulmonic stenosis. Aorta: The aortic root is normal in size and  structure. Venous: The inferior vena cava is normal in size with greater than 50% respiratory variability, suggesting right atrial pressure of 3 mmHg. IAS/Shunts: No atrial level shunt detected by color flow Doppler.  LEFT VENTRICLE PLAX 2D LVIDd:         4.60 cm   Diastology LVIDs:         3.50 cm   LV e' medial:    5.48 cm/s LV PW:         1.20 cm   LV E/e' medial:  14.3 LV IVS:        1.10 cm   LV e' lateral:   6.30 cm/s LVOT diam:     2.00 cm   LV E/e' lateral: 12.4 LVOT Area:     3.14 cm  RIGHT VENTRICLE            IVC RV Basal diam:  3.00 cm    IVC diam: 1.30 cm RV S prime:     6.90 cm/s TAPSE (M-mode): 1.6 cm LEFT ATRIUM             Index LA diam:        3.40 cm 1.73 cm/m LA Vol (A2C):   51.7 ml 26.30 ml/m LA Vol (A4C):   52.7 ml 26.81 ml/m LA Biplane Vol: 52.6 ml 26.75 ml/m  AORTIC VALVE              PULMONIC VALVE AV Vmax:      113.00 cm/s PV Vmax:       0.68 m/s AV Vmean:     85.000 cm/s PV Peak grad:  1.8 mmHg AV VTI:       0.254 m AV Peak Grad: 5.1 mmHg AV Mean Grad: 3.0 mmHg  AORTA Ao Root diam: 3.50 cm Ao Asc diam:  3.70  cm Ao Arch diam: 3.6 cm MITRAL VALVE MV Area (PHT): 2.73 cm     SHUNTS MV Decel Time: 278 msec     Systemic Diam: 2.00 cm MV E velocity: 78.10 cm/s MV A velocity: 105.00 cm/s MV E/A ratio:  0.74 Arvilla Meres MD Electronically signed by Arvilla Meres MD Signature Date/Time: 09/30/2022/3:48:16 PM    Final    CARDIAC CATHETERIZATION  Result Date: 09/30/2022   Ramus-2 lesion is 75% stenosed.   Ramus-1 lesion is 80% stenosed.   Mid LAD lesion is 75% stenosed.   Ost LAD to Prox LAD lesion is 50% stenosed.   Ost Cx to Prox Cx lesion is 60% stenosed.   Mid LM to Ost LAD lesion is 40% stenosed.   Ost RCA to Prox RCA lesion is 50% stenosed.   RPDA lesion is 75% stenosed.   LV end diastolic pressure is normal.   There is no aortic valve stenosis.   Severe right subclavian tortuosity.  85 cm destination sheath not available today.  Right groin approach used. Moderate disease in the distal left main, ostial LAD and ostial circumflex.  Severe disease in the large ramus and mid LAD.  Will obtain surgical consultation.  I discussed the findings with the wife and specifically asked about the DNR status.  It was meant to be more related to 'he would not want to live on a vent if he had brain damage.' If he was not a candidate for CABG due to other comorbidities, could consider PCI of the mid LAD and medical therapy of the ramus vessel and other moderate disease.   CT Angio Chest PE W and/or Wo Contrast  Result Date:  09/29/2022 CLINICAL DATA:  Pulmonary embolism (PE) suspected, low to intermediate prob, positive D-dimer EXAM: CT ANGIOGRAPHY CHEST WITH CONTRAST TECHNIQUE: Multidetector CT imaging of the chest was performed using the standard protocol during bolus administration of intravenous contrast. Multiplanar CT image reconstructions and MIPs were obtained to evaluate the vascular anatomy. RADIATION DOSE REDUCTION: This exam was performed according to the departmental dose-optimization program which includes  automated exposure control, adjustment of the mA and/or kV according to patient size and/or use of iterative reconstruction technique. CONTRAST:  75mL OMNIPAQUE IOHEXOL 350 MG/ML SOLN COMPARISON:  11/26/2021 FINDINGS: Cardiovascular: Satisfactory opacification of the pulmonary arteries to the segmental level. No evidence of pulmonary embolism. Thoracic aorta is nonaneurysmal. Atherosclerotic vascular calcifications of the aorta and coronary arteries. Normal heart size. No pericardial effusion. Mediastinum/Nodes: No enlarged mediastinal, hilar, or axillary lymph nodes. Thyroid gland and trachea demonstrate no significant findings. Distal esophagus is mildly thickened. Lungs/Pleura: Passive atelectasis within the dependent lung fields. No focal consolidation. No pleural effusion or pneumothorax. Upper Abdomen: No acute abnormality. Musculoskeletal: No chest wall abnormality. No acute or significant osseous findings. Prior cement augmentation of the T5 vertebral body. Review of the MIP images confirms the above findings. IMPRESSION: 1. No evidence of pulmonary embolism or other acute intrathoracic findings. 2. Mild thickening of the distal esophagus, which can be seen in the setting of esophagitis. 3. Aortic and coronary artery atherosclerosis (ICD10-I70.0). Electronically Signed   By: Duanne Guess D.O.   On: 09/29/2022 09:15   DG Chest Port 1 View  Result Date: 09/29/2022 CLINICAL DATA:  Sepsis. EXAM: PORTABLE CHEST 1 VIEW COMPARISON:  11/24/2021 FINDINGS: The heart size and mediastinal contours are within normal limits. Both lungs are clear. Previous midthoracic spine vertebroplasty noted. IMPRESSION: No active disease. Electronically Signed   By: Danae Orleans M.D.   On: 09/29/2022 08:03    Labs: BNP (last 3 results) Recent Labs    09/29/22 1926  BNP 195.4*   Basic Metabolic Panel: Recent Labs  Lab 09/29/22 0654 09/30/22 0241 10/01/22 0307 10/02/22 0218 10/03/22 0324  NA 134* 134* 134* 133*  132*  K 3.4* 3.6 3.3* 3.7 3.8  CL 101 97* 99 102 100  CO2 21*  GLUCOSE 185* 119* 170* 174* 205*  BUN CREATININE 0.90 0.80 0.85 0.81 0.79  CALCIUM 8.5* 8.7* 8.6* 8.2* 8.5*   Liver Function Tests: Recent Labs  Lab 09/29/22 0654 09/30/22 0241 10/01/22 0307 10/02/22 0218 10/03/22 0324  AST 14* 25  ALT 19 5 5 7 6   ALKPHOS 59 62 61 58 65  BILITOT 0.6 0.8 0.6 0.6 0.6  PROT 7.4 6.6 6.7 6.5 6.8  ALBUMIN 3.1* 2.7* 2.5* 2.5* 2.7*   No results for input(s): "LIPASE", "AMYLASE" in the last 168 hours. No results for input(s): "AMMONIA" in the last 168 hours. CBC: Recent Labs  Lab 09/29/22 0654 09/30/22 0241 10/01/22 0307 10/02/22 0218 10/03/22 0324  WBC 9.5 15.5* 9.2 8.6 9.0  NEUTROABS 8.1*  --   --   --   --   HGB 14.8 13.9 13.4 12.9* 13.4  HCT 42.5 39.9 39.7 36.3* 38.2*  MCV 90.4 91.3 92.1 89.4 88.6  PLT 277 218 258 247 285   Cardiac Enzymes: No results for input(s): "CKTOTAL", "CKMB", "CKMBINDEX", "TROPONINI" in the last 168 hours. BNP: Invalid input(s): "POCBNP" CBG: Recent Labs  Lab 10/02/22 1633 10/02/22 2103 10/03/22 0720 10/03/22 0913 10/03/22 1112  GLUCAP 198* 177* 191* 246*  261*   D-Dimer No results for input(s): "DDIMER" in the last 72 hours. Hgb A1c No results for input(s): "HGBA1C" in the last 72 hours. Lipid Profile Recent Labs    10/03/22 0324  CHOL 163  HDL 24*  LDLCALC 111*  TRIG 141  CHOLHDL 6.8   Thyroid function studies No results for input(s): "TSH", "T4TOTAL", "T3FREE", "THYROIDAB" in the last 72 hours.  Invalid input(s): "FREET3" Anemia work up No results for input(s): "VITAMINB12", "FOLATE", "FERRITIN", "TIBC", "IRON", "RETICCTPCT" in the last 72 hours. Urinalysis    Component Value Date/Time   COLORURINE YELLOW 11/24/2021 2024   APPEARANCEUR CLEAR 11/24/2021 2024   LABSPEC 1.032 (H) 11/24/2021 2024   PHURINE 5.0 11/24/2021 2024   GLUCOSEU >=500 (A) 11/24/2021 2024   HGBUR NEGATIVE  11/24/2021 2024   BILIRUBINUR NEGATIVE 11/24/2021 2024   KETONESUR 20 (A) 11/24/2021 2024   PROTEINUR NEGATIVE 11/24/2021 2024   NITRITE NEGATIVE 11/24/2021 2024   LEUKOCYTESUR NEGATIVE 11/24/2021 2024   Sepsis Labs Recent Labs  Lab 09/30/22 0241 10/01/22 0307 10/02/22 0218 10/03/22 0324  WBC 15.5* 9.2 8.6 9.0   Microbiology Recent Results (from the past 240 hour(s))  Blood Culture (routine x 2)     Status: None (Preliminary result)   Collection Time: 09/29/22  6:55 AM   Specimen: BLOOD RIGHT FOREARM  Result Value Ref Range Status   Specimen Description BLOOD RIGHT FOREARM  Final   Special Requests   Final    BOTTLES DRAWN AEROBIC AND ANAEROBIC Blood Culture adequate volume   Culture   Final    NO GROWTH 4 DAYS Performed at [facility], [address] [place], [place] [address]    Report Status PENDING  Incomplete  Blood Culture (routine x 2)     Status: None (Preliminary result)   Collection Time: 09/29/22  8:56 AM   Specimen: BLOOD  Result Value Ref Range Status   Specimen Description BLOOD SITE NOT SPECIFIED  Final   Special Requests   Final    BOTTLES DRAWN AEROBIC AND ANAEROBIC Blood Culture results may not be optimal due to an inadequate volume of blood received in culture bottles   Culture   Final    NO GROWTH 4 DAYS Performed at [facility], [address], [place], [place] [address]    Report Status PENDING  Incomplete  Respiratory (~20 pathogens) panel by PCR     Status: None   Collection Time: 09/29/22  5:38 PM   Specimen: Nasopharyngeal Swab; Respiratory  Result Value Ref Range Status   Adenovirus NOT DETECTED NOT DETECTED Final   Coronavirus 229E NOT DETECTED NOT DETECTED Final    Comment: (NOTE) The Coronavirus on the Respiratory Panel, DOES NOT test for the novel  Coronavirus (2019 nCoV)    Coronavirus HKU1 NOT DETECTED NOT DETECTED Final   Coronavirus NL63 NOT DETECTED NOT DETECTED Final   Coronavirus OC43 NOT DETECTED NOT DETECTED  Final   Metapneumovirus NOT DETECTED NOT DETECTED Final   Rhinovirus / Enterovirus NOT DETECTED NOT DETECTED Final   Influenza A NOT DETECTED NOT DETECTED Final   Influenza B NOT DETECTED NOT DETECTED Final   Parainfluenza Virus 1 NOT DETECTED NOT DETECTED Final   Parainfluenza Virus 2 NOT DETECTED NOT DETECTED Final   Parainfluenza Virus 3 NOT DETECTED NOT DETECTED Final   Parainfluenza Virus 4 NOT DETECTED NOT DETECTED Final   Respiratory Syncytial Virus NOT DETECTED NOT DETECTED Final   Bordetella pertussis NOT DETECTED NOT DETECTED Final  Bordetella Parapertussis NOT DETECTED NOT DETECTED Final   Chlamydophila pneumoniae NOT DETECTED NOT DETECTED Final   Mycoplasma pneumoniae NOT DETECTED NOT DETECTED Final    Comment: Performed at [facility], [address] [place], [place] [address]  Time coordinating discharge: 25 minutes  SIGNED: Lanae Boast, MD  [facility] 10/03/2022, 12:48 PM  If 7PM-7AM, please contact night-coverage [email or web address]

## 2022-10-03 NOTE — Progress Notes (Signed)
Initial Nutrition Assessment  INTERVENTION:  No interventions at this time-- patient is d/c'ing.   REASON FOR ASSESSMENT:   Consult Assessment of nutrition requirement/status  ASSESSMENT:  77 y.o. male with PMHx including DVT/PE in 2021 anticoagulant Rx expired 2 wks ago, CAD, HTN, migraine T2DM, rheumatoid arthritis, slipped vertebral cervical disc, artherosclerosis presents with chest pain x few weeks and admitted for NSTEMI   4/22 L heart cath and coronary angiogram  4/23 coronary stent intervention   Labs:  Na 132, Glu 205, HDL 24, LDL 111, hga1c 6.7 Meds: sinemet, folvite, insulin, protonix, NS Wt: 3.6 kg (4%) wt loss x 5 months  PO: no meals documented at this time  I/O's:  -4 L   Visited patient at bedside who has flat affect, does not make eye contact, and gave curt responses. He reports good appetite and at baseline which is 3 meals per day and sometimes a snack.   Patient reports UBW of 170# and denies any weight loss. Patient denies N/V/D/C, trouble chewing/swallowing.   D/c summary in. RD signing off. Please re-consult if any nutrition needs arise.    Leodis Rains, RDN, LDN  Clinical Nutrition

## 2022-10-03 NOTE — Progress Notes (Addendum)
Progress Note  Patient Name: William Hunter Date of Encounter: 10/03/2022  Primary Cardiologist: Donato Schultz, MD  Subjective   Feeling tired but no CP or SOB.  Inpatient Medications    Scheduled Meds:  amLODipine  5 mg Oral Daily   aspirin EC  81 mg Oral Daily   carbidopa-levodopa  1 tablet Oral TID   clopidogrel  75 mg Oral Q breakfast   folic acid  1 mg Oral Daily   hydroxychloroquine  200 mg Oral Daily   insulin aspart  0-5 Units Subcutaneous QHS   insulin aspart  0-9 Units Subcutaneous TID WC   isosorbide mononitrate  15 mg Oral Daily   mirabegron ER  25 mg Oral Daily   sodium chloride flush  3 mL Intravenous Q12H   sodium chloride flush  3 mL Intravenous Q12H   sodium chloride flush  3 mL Intravenous Q12H   sodium chloride flush  3 mL Intravenous Q12H   Continuous Infusions:  sodium chloride     sodium chloride     PRN Meds: sodium chloride, sodium chloride, acetaminophen, meclizine, nitroGLYCERIN, ondansetron (ZOFRAN) IV, sodium chloride flush, sodium chloride flush   Vital Signs    Vitals:   10/02/22 1634 10/02/22 1936 10/03/22 0435 10/03/22 0721  BP: 117/71 129/74 (!) 192/94 (!) 140/87  Pulse: 78 83 97 84  Resp: Temp: 98.1 F (36.7 C) 98.1 F (36.7 C) 99 F (37.2 C) 98 F (36.7 C)  TempSrc: Oral Oral Oral Oral  SpO2: 97% 98% 97% 97%  Weight:      Height:        Intake/Output Summary (Last 24 hours) at 10/03/2022 0935 Last data filed at 10/03/2022 1610 Gross per 24 hour  Intake 441.32 ml  Output 3350 ml  Net -2908.68 ml      10/02/2022    5:46 AM 09/30/2022    4:06 AM 09/29/2022    6:42 AM  Last 3 Weights  Weight (lbs) 180 lb 12.4 oz 173 lb 11.6 oz 173 lb  Weight (kg) 82 kg 78.8 kg 78.472 kg     Telemetry    NSR, 4 beat atrial run, occ PAC, one PVC couplet - Personally Reviewed  ECG    NSR 90bpm no acute STT changes - Personally Reviewed  Physical Exam   GEN: Generally malnourished appearing HEENT: Normocephalic,  atraumatic, sclera non-icteric. Neck: No JVD or bruits. Cardiac: RRR no murmurs, rubs, or gallops.  Respiratory: Clear to auscultation bilaterally. Breathing is unlabored. GI: Soft, nontender, non-distended, BS +x 4. MS: no deformity. Extremities: No clubbing or cyanosis. No edema. Distal pedal pulses are 2+ and equal bilaterally. Left radial cath site without hematoma or ecchymosis; good pulse (report states right radial but patient has left radial dressing) However, there are scattered ecchymosis on various extremities with senile purpura appearance. Right ulnar and right groin cath sites are stable without hematoma, bruit, or ecchymosis. Neuro:  AAOx3. Follows commands. Psych:  Responds to questions appropriately with a flat affect.  Labs    High Sensitivity Troponin:   Recent Labs  Lab 09/29/22 0654 09/29/22 0914  TROPONINIHS 534* 357*      Cardiac EnzymesNo results for input(s): "TROPONINI" in the last 168 hours. No results for input(s): "TROPIPOC" in the last 168 hours.   Chemistry Recent Labs  Lab 10/01/22 0307 10/02/22 0218 10/03/22 0324  NA 134* 133* 132*  K 3.3* 3.7 3.8  CL 99 102 100  CO2 25 23 21*  GLUCOSE 170* 174* 205*  BUN 15 20 13   CREATININE 0.85 0.81 0.79  CALCIUM 8.6* 8.2* 8.5*  PROT 6.7 6.5 6.8  ALBUMIN 2.5* 2.5* 2.7*  AST 16 14* 25  ALT 5 7 6   ALKPHOS 61 58 65  BILITOT 0.6 0.6 0.6  GFRNONAA >60 >60 >60  ANIONGAP 10 8 11      Hematology Recent Labs  Lab 10/01/22 0307 10/02/22 0218 10/03/22 0324  WBC 9.2 8.6 9.0  RBC 4.31 4.06* 4.31  HGB 13.4 12.9* 13.4  HCT 39.7 36.3* 38.2*  MCV 92.1 89.4 88.6  MCH 31.1 31.8 31.1  MCHC 33.8 35.5 35.1  RDW 13.0 13.0 12.8  PLT 258 247 285    BNP Recent Labs  Lab 09/29/22 1926  BNP 195.4*     DDimer  Recent Labs  Lab 09/29/22 0654  DDIMER 0.97*     Radiology    CARDIAC CATHETERIZATION  Addendum Date: 10/02/2022     Mid LAD lesion is 75% stenosed.  A drug-eluting stent was successfully  placed using a SYNERGY XD 3.50X38, postdilated to 3.75 mm and optimized with intravascular ultrasound.   Post intervention, there is a 0% residual stenosis.   Ramus-2 lesion is 75% stenosed.   Ramus-1 lesion is 80% stenosed.   Ost Cx to Prox Cx lesion is 50% stenosed- eccentric, most notable in caudal view.   Mid LM to Ost LAD lesion is 40% stenosed.  Smallest Cross-sectional area 6.6 mm by intravascular ultrasound, not significant.   Ost LAD to Prox LAD lesion is 50% stenosed.  Cross-sectional area 4.2 mm, not significant, by intravascular ultrasound for a very short segment, just before the left main.  The remainder of the LAD cross-sectional area is significantly larger.   A drug-eluting stent was successfully placed using a SYNERGY XD 3.50X38. Continue dual antiplatelet therapy along with aggressive secondary prevention.  Okay to switch to Brilinta if that has less drug interactions with his other neurologic medicines.  Will check with pharmacy prior to discharge.  Result Date: 10/02/2022   Mid LAD lesion is 75% stenosed.  A drug-eluting stent was successfully placed using a SYNERGY XD 3.50X38, postdilated to 3.75 mm and optimized with intravascular ultrasound.   Post intervention, there is a 0% residual stenosis.   Ramus-2 lesion is 75% stenosed.   Ramus-1 lesion is 80% stenosed.   Ost Cx to Prox Cx lesion is 50% stenosed- eccentric, most notable in caudal view.   Mid LM to Ost LAD lesion is 40% stenosed.  Smallest Cross-sectional area 6.6 mm by intravascular ultrasound.   Ost LAD to Prox LAD lesion is 50% stenosed.  Cross-sectional area 4.2 mm by intravascular ultrasound for a very short segment, just before the left main.  The remainder of the LAD cross-sectional area is significantly   A drug-eluting stent was successfully placed using a SYNERGY XD 3.50X38. Continue dual antiplatelet therapy along with aggressive secondary prevention.  Okay to switch to Brilinta if that has less drug interactions with  his other neurologic medicines.  Will check with pharmacy prior to discharge.    Cardiac Studies   2D echo 09/30/22    1. Left ventricular ejection fraction, by estimation, is 60 to 65%. The  left ventricle has normal function. The left ventricle has no regional  wall motion abnormalities. There is mild concentric left ventricular  hypertrophy. Left ventricular diastolic  parameters are consistent with Grade I diastolic dysfunction (impaired  relaxation).   2. Right ventricular systolic function is normal. The  right ventricular  size is normal.   3. Left atrial size was mildly dilated.   4. The mitral valve is normal in structure. No evidence of mitral valve  regurgitation. No evidence of mitral stenosis.   5. The aortic valve is tricuspid. There is mild calcification of the  aortic valve. Aortic valve regurgitation is not visualized. Aortic valve  sclerosis/calcification is present, without any evidence of aortic  stenosis. Aortic valve mean gradient  measures 3.0 mmHg. Aortic valve Vmax measures 1.13 m/s.   6. The inferior vena cava is normal in size with greater than 50%  respiratory variability, suggesting right atrial pressure of 3 mmHg.   PCI 10/02/22   Mid LAD lesion is 75% stenosed.  A drug-eluting stent was successfully placed using a SYNERGY XD 3.50X38, postdilated to 3.75 mm and optimized with intravascular ultrasound.   Post intervention, there is a 0% residual stenosis.   Ramus-2 lesion is 75% stenosed.   Ramus-1 lesion is 80% stenosed.   Ost Cx to Prox Cx lesion is 50% stenosed- eccentric, most notable in caudal view.   Mid LM to Ost LAD lesion is 40% stenosed.  Smallest Cross-sectional area 6.6 mm by intravascular ultrasound, not significant.   Ost LAD to Prox LAD lesion is 50% stenosed.  Cross-sectional area 4.2 mm, not significant, by intravascular ultrasound for a very short segment, just before the left main.  The remainder of the LAD cross-sectional area is  significantly larger.   A drug-eluting stent was successfully placed using a SYNERGY XD 3.50X38.   Continue dual antiplatelet therapy along with aggressive secondary prevention.  Okay to switch to Brilinta if that has less drug interactions with his other neurologic medicines.  Will check with pharmacy prior to discharge.  Patient Profile     77 y.o. 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 CAD/NSTEMI, HLD - not felt to be good candidate for CABG - went for PCI yesterday with DES to mLAD - residual disease managed medically - per d/w pharmD, the team has opted for Plavix instead of Brilinta due to concomitant Eliquis for h/o recurrent VTE - will discuss ASA plan with MD (got today) - otherwise on amlodipine, Imdur  daily - 2013 notes indicate plan to avoid BB with remote vasospasm but given NSTEMI may benefit  - intolerant of statins, would benefit from lipid clinic referral at follow-up - placed order - 2D echo EF 60-65%, mild LVH, G1DD, mild LAE, aortic sclerosis without stenosis  2. Narrow complex tachycardia - brief run noted PM of 4/23, possibly a-tach - 4 beat atrial run this AM, asymptomatic - consider BB as above, no overt AFib / AFL seen  3. Essential HTN - SBP appears extremely variable, but last checked 140/87, manage in context above  4. H/o PE/DVT - resume Eliquis this AM per d/w MD  5. Hypoalbuminemia - suspect protein calorie malnutrition - consult dietitian to supplementation assessment  Other issues this admission, to be further managed by internal medicine: - Esophageal thickening - CTA showing mild thickening of the distal esophagus, which can be seen in the setting of esophagitis, planned for PPI but not yet ordered, will order  protonix  daily - low grade fever without clear source, ? Due to MI, workup unrevealing  thus far - reported ? blood in pad, FOBT+ earlier this admission with stable Hgb - IM recommends if no active bleeding would need GI referral on DC - Hyponatremia in setting of DM - sodium corrects to 134-135 for glucose - abnormal TSH - suppressed - will obtain free T4 otherwise recommend further eval by IM  I've tentatively moved up f/u from 12/2022 to 10/16/22 and updated AVS Patient felt to require SNF per notes  [facility] will sign off.   Medication Recommendations: Eliquis 5 mg twice daily, Plavix 75 mg daily, Toprol-XL 25 mg daily, Imdur 15 mg daily, amlodipine 5 mg daily Other recommendations (labs, testing, etc): None Follow up as an outpatient: Scheduled for 10/16/2022.  Will also refer to lipid clinic   For questions or updates, please contact [facility] Please consult [email or web address] for contact info under Cardiology/STEMI.  Signed, Laurann Montana, PA-C 10/03/2022, 9:35 AM     Patient seen and examined.  Agree with above documentation.  On exam, patient is alert and oriented, regular rate and rhythm, no murmurs, lungs CTAB, no LE edema.  Denies any chest pain.  Discussed with pharmacy, continue Plavix instead of Brilinta given he will also be on Eliquis.  Not a good candidate for triple therapy, would plan Plavix plus Eliquis moving forward.  OK for discharge today.  Little Ishikawa, MD

## 2022-10-04 ENCOUNTER — Encounter: Payer: Self-pay | Admitting: Physician Assistant

## 2022-10-04 DIAGNOSIS — M069 Rheumatoid arthritis, unspecified: Secondary | ICD-10-CM | POA: Diagnosis not present

## 2022-10-04 DIAGNOSIS — K209 Esophagitis, unspecified without bleeding: Secondary | ICD-10-CM | POA: Diagnosis not present

## 2022-10-04 DIAGNOSIS — I2510 Atherosclerotic heart disease of native coronary artery without angina pectoris: Secondary | ICD-10-CM | POA: Diagnosis not present

## 2022-10-04 DIAGNOSIS — E118 Type 2 diabetes mellitus with unspecified complications: Secondary | ICD-10-CM | POA: Diagnosis not present

## 2022-10-04 DIAGNOSIS — M6281 Muscle weakness (generalized): Secondary | ICD-10-CM | POA: Diagnosis not present

## 2022-10-04 DIAGNOSIS — I214 Non-ST elevation (NSTEMI) myocardial infarction: Secondary | ICD-10-CM | POA: Diagnosis not present

## 2022-10-04 LAB — CULTURE, BLOOD (ROUTINE X 2): Culture: NO GROWTH

## 2022-10-04 NOTE — Progress Notes (Signed)
Kym Groom, PA-C Cc: [name], CMA Spoke with patient wife and she stated that patient is in Rehab @ [facility].   He will be in rehab for a while.   She will call back in the near future to make the appointment

## 2022-10-07 DIAGNOSIS — K922 Gastrointestinal hemorrhage, unspecified: Secondary | ICD-10-CM | POA: Diagnosis not present

## 2022-10-07 DIAGNOSIS — I214 Non-ST elevation (NSTEMI) myocardial infarction: Secondary | ICD-10-CM | POA: Diagnosis not present

## 2022-10-07 DIAGNOSIS — K209 Esophagitis, unspecified without bleeding: Secondary | ICD-10-CM | POA: Diagnosis not present

## 2022-10-07 DIAGNOSIS — R2689 Other abnormalities of gait and mobility: Secondary | ICD-10-CM | POA: Diagnosis not present

## 2022-10-07 DIAGNOSIS — E1165 Type 2 diabetes mellitus with hyperglycemia: Secondary | ICD-10-CM | POA: Diagnosis not present

## 2022-10-07 DIAGNOSIS — R2681 Unsteadiness on feet: Secondary | ICD-10-CM | POA: Diagnosis not present

## 2022-10-07 DIAGNOSIS — M6281 Muscle weakness (generalized): Secondary | ICD-10-CM | POA: Diagnosis not present

## 2022-10-08 ENCOUNTER — Telehealth: Payer: Self-pay | Admitting: Neurology

## 2022-10-08 DIAGNOSIS — E118 Type 2 diabetes mellitus with unspecified complications: Secondary | ICD-10-CM | POA: Diagnosis not present

## 2022-10-08 DIAGNOSIS — I214 Non-ST elevation (NSTEMI) myocardial infarction: Secondary | ICD-10-CM | POA: Diagnosis not present

## 2022-10-08 DIAGNOSIS — M069 Rheumatoid arthritis, unspecified: Secondary | ICD-10-CM | POA: Diagnosis not present

## 2022-10-08 DIAGNOSIS — K209 Esophagitis, unspecified without bleeding: Secondary | ICD-10-CM | POA: Diagnosis not present

## 2022-10-08 NOTE — Telephone Encounter (Signed)
William Hunter: [id number] exp. 10/08/22-11/07/22 sent to GI [phone] Dr. Terrace Arabia ordered in November to be scheduled in May.

## 2022-10-10 DIAGNOSIS — M6281 Muscle weakness (generalized): Secondary | ICD-10-CM | POA: Diagnosis not present

## 2022-10-10 DIAGNOSIS — R2689 Other abnormalities of gait and mobility: Secondary | ICD-10-CM | POA: Diagnosis not present

## 2022-10-10 DIAGNOSIS — I214 Non-ST elevation (NSTEMI) myocardial infarction: Secondary | ICD-10-CM | POA: Diagnosis not present

## 2022-10-10 DIAGNOSIS — R2681 Unsteadiness on feet: Secondary | ICD-10-CM | POA: Diagnosis not present

## 2022-10-14 DIAGNOSIS — R2689 Other abnormalities of gait and mobility: Secondary | ICD-10-CM | POA: Diagnosis not present

## 2022-10-14 DIAGNOSIS — K922 Gastrointestinal hemorrhage, unspecified: Secondary | ICD-10-CM | POA: Diagnosis not present

## 2022-10-14 DIAGNOSIS — I214 Non-ST elevation (NSTEMI) myocardial infarction: Secondary | ICD-10-CM | POA: Diagnosis not present

## 2022-10-14 DIAGNOSIS — E1165 Type 2 diabetes mellitus with hyperglycemia: Secondary | ICD-10-CM | POA: Diagnosis not present

## 2022-10-14 DIAGNOSIS — K209 Esophagitis, unspecified without bleeding: Secondary | ICD-10-CM | POA: Diagnosis not present

## 2022-10-14 DIAGNOSIS — M6281 Muscle weakness (generalized): Secondary | ICD-10-CM | POA: Diagnosis not present

## 2022-10-14 DIAGNOSIS — R2681 Unsteadiness on feet: Secondary | ICD-10-CM | POA: Diagnosis not present

## 2022-10-16 ENCOUNTER — Ambulatory Visit: Payer: Medicare HMO | Attending: Physician Assistant | Admitting: Physician Assistant

## 2022-10-16 DIAGNOSIS — G4700 Insomnia, unspecified: Secondary | ICD-10-CM | POA: Diagnosis not present

## 2022-10-16 DIAGNOSIS — R1111 Vomiting without nausea: Secondary | ICD-10-CM | POA: Diagnosis not present

## 2022-10-16 NOTE — Progress Notes (Deleted)
  Cardiology Office Note:    Date:  10/16/2022  ID:  [name], DOB 11-Feb-1946, MRN [id number] PCP: Irena Reichmann, DO  [facility] Providers Cardiologist:  Donato Schultz, MD { Click to update primary MD,subspecialty MD or APP then REFRESH:1}    {Click to Open Review  :1}      Patient Profile:   Coronary artery disease   Hx of MI>> Cath in 1981: no CAD >> Managed for vasospasm Myoview 03/10/12: EF 54, no ischemia  TEE 11/27/2021: EF 60-65, ascending aorta (40 mm)  NSTEMI s/p 3.5 x 38 mm DES to the mid LAD in 09/2022 Eval by [facility]>>not a good candidate for CABG LHC 10/02/2022: LM mid-LAD ost 40; LAD ost 50, mid 75; RI 80, 75; LCx ost 50 TTE 10/03/2022: EF 60-65, no RWMA, mild LVH, normal RVSF, mild LAE, AV sclerosis, RAP 3 Rheumatoid arthritis  Hyperlipidemia  Intol to statins Hypertension  Diabetes mellitus  Hx of DVT  Recurrent DVT/pulmonary embolism 11/2021>>chronic anticoagulation  Aortic atherosclerosis   Esophagitis       History of Present Illness:   William Hunter is a 77 y.o. male who returns for post hospital f/u. He was admitted 4/21-4/25 with a NSTEMI. Cardiac catheterization demonstrated 3 v CAD. He was seen by [facility] but was not felt to be a good CABG candidate (multiple comorbid illnesses). He underwent PCI with DES to the LAD. He was not felt to be a good candidate for "triple therapy." He was placed on Plavix due to the need for Eliquis and was kept off ASA. EF was normal on echocardiogram. He will need Lipid Clinic referral b/c of intol to statins. He was noted to have esophagitis on a CT scan and was placed on PPI. He did have rectal bleeding noted and hemoccult + stool. Hgb was normal. DC notes indicate he has been referred to GI.   ROS ***    Studies Reviewed:    EKG:  ***  *** Risk Assessment/Calculations:   {Does this patient have ATRIAL FIBRILLATION?[phone]} No BP recorded.  {Refresh Note OR Click here to enter BP  :1}***       Physical Exam:   VS:   There were no vitals taken for this visit.   Wt Readings from Last 3 Encounters:  10/02/22 180 lb 12.4 oz (82 kg)  09/26/22 173 lb (78.5 kg)  04/10/22 181 lb (82.1 kg)    Physical Exam***    ASSESSMENT AND PLAN:   No problem-specific Assessment & Plan notes found for this encounter. *** {The patient has an active order for outpatient cardiac rehabilitation.   Please indicate if the patient is ready to start. Do NOT delete this.  It will auto delete.  Refresh note, then sign.              Click here to document readiness and see contraindications.  :1}  Cardiac Rehabilitation Eligibility Assessment      {Are you ordering a CV Procedure (e.g. stress test, cath, DCCV, TEE, etc)?   Press F2        :[id number]}  Dispo:  No follow-ups on file.  Signed, Tereso Newcomer, PA-C

## 2022-10-17 DIAGNOSIS — M6281 Muscle weakness (generalized): Secondary | ICD-10-CM | POA: Diagnosis not present

## 2022-10-17 DIAGNOSIS — R2681 Unsteadiness on feet: Secondary | ICD-10-CM | POA: Diagnosis not present

## 2022-10-17 DIAGNOSIS — I214 Non-ST elevation (NSTEMI) myocardial infarction: Secondary | ICD-10-CM | POA: Diagnosis not present

## 2022-10-17 DIAGNOSIS — R2689 Other abnormalities of gait and mobility: Secondary | ICD-10-CM | POA: Diagnosis not present

## 2022-10-17 NOTE — Telephone Encounter (Signed)
Note is signed.

## 2022-10-18 NOTE — Telephone Encounter (Signed)
Submitted a Prior Authorization request to [facility] for HUMIRA via CoverMyMeds. Will update once we receive a response.  Key: [id number]  Per automated response, authorization already on file for this request. Authorization starting on 09/26/2022 and ending on 06/10/2023.  Patient filling through [facility] Assist patient assistance  Chesley Mires, PharmD, MPH, BCPS, CPP Clinical Pharmacist (Rheumatology and Pulmonology)

## 2022-10-18 NOTE — Telephone Encounter (Signed)
I think he is fine to go ahead with continuing Humira. Has significant CAD but not CHF and recent TTE on 4/22 with normal EF and grade 1 diastolic dysfunction.

## 2022-10-18 NOTE — Telephone Encounter (Signed)
Refill was sent to [facility] on 09/30/22 by Dr. Laren Everts, PharmD, MPH, BCPS, CPP Clinical Pharmacist (Rheumatology and Pulmonology)

## 2022-10-21 ENCOUNTER — Ambulatory Visit: Payer: Medicare HMO | Admitting: Physical Therapy

## 2022-10-21 DIAGNOSIS — R2681 Unsteadiness on feet: Secondary | ICD-10-CM | POA: Diagnosis not present

## 2022-10-21 DIAGNOSIS — E1165 Type 2 diabetes mellitus with hyperglycemia: Secondary | ICD-10-CM | POA: Diagnosis not present

## 2022-10-21 DIAGNOSIS — I214 Non-ST elevation (NSTEMI) myocardial infarction: Secondary | ICD-10-CM | POA: Diagnosis not present

## 2022-10-21 DIAGNOSIS — M6281 Muscle weakness (generalized): Secondary | ICD-10-CM | POA: Diagnosis not present

## 2022-10-21 DIAGNOSIS — R2689 Other abnormalities of gait and mobility: Secondary | ICD-10-CM | POA: Diagnosis not present

## 2022-10-21 DIAGNOSIS — K922 Gastrointestinal hemorrhage, unspecified: Secondary | ICD-10-CM | POA: Diagnosis not present

## 2022-10-21 DIAGNOSIS — R1111 Vomiting without nausea: Secondary | ICD-10-CM | POA: Diagnosis not present

## 2022-10-21 DIAGNOSIS — M069 Rheumatoid arthritis, unspecified: Secondary | ICD-10-CM | POA: Diagnosis not present

## 2022-10-23 DIAGNOSIS — K219 Gastro-esophageal reflux disease without esophagitis: Secondary | ICD-10-CM | POA: Diagnosis not present

## 2022-10-23 DIAGNOSIS — E1165 Type 2 diabetes mellitus with hyperglycemia: Secondary | ICD-10-CM | POA: Diagnosis not present

## 2022-10-23 DIAGNOSIS — I10 Essential (primary) hypertension: Secondary | ICD-10-CM | POA: Diagnosis not present

## 2022-10-23 DIAGNOSIS — G4700 Insomnia, unspecified: Secondary | ICD-10-CM | POA: Diagnosis not present

## 2022-10-25 DIAGNOSIS — G43909 Migraine, unspecified, not intractable, without status migrainosus: Secondary | ICD-10-CM | POA: Diagnosis not present

## 2022-10-25 DIAGNOSIS — I214 Non-ST elevation (NSTEMI) myocardial infarction: Secondary | ICD-10-CM | POA: Diagnosis not present

## 2022-10-25 DIAGNOSIS — R1312 Dysphagia, oropharyngeal phase: Secondary | ICD-10-CM | POA: Diagnosis not present

## 2022-10-25 DIAGNOSIS — M5126 Other intervertebral disc displacement, lumbar region: Secondary | ICD-10-CM | POA: Diagnosis not present

## 2022-10-25 DIAGNOSIS — E1142 Type 2 diabetes mellitus with diabetic polyneuropathy: Secondary | ICD-10-CM | POA: Diagnosis not present

## 2022-10-25 DIAGNOSIS — M069 Rheumatoid arthritis, unspecified: Secondary | ICD-10-CM | POA: Diagnosis not present

## 2022-10-25 DIAGNOSIS — M170 Bilateral primary osteoarthritis of knee: Secondary | ICD-10-CM | POA: Diagnosis not present

## 2022-10-25 DIAGNOSIS — I2510 Atherosclerotic heart disease of native coronary artery without angina pectoris: Secondary | ICD-10-CM | POA: Diagnosis not present

## 2022-10-25 DIAGNOSIS — I10 Essential (primary) hypertension: Secondary | ICD-10-CM | POA: Diagnosis not present

## 2022-10-26 DIAGNOSIS — G43909 Migraine, unspecified, not intractable, without status migrainosus: Secondary | ICD-10-CM | POA: Diagnosis not present

## 2022-10-26 DIAGNOSIS — E1142 Type 2 diabetes mellitus with diabetic polyneuropathy: Secondary | ICD-10-CM | POA: Diagnosis not present

## 2022-10-26 DIAGNOSIS — R1312 Dysphagia, oropharyngeal phase: Secondary | ICD-10-CM | POA: Diagnosis not present

## 2022-10-26 DIAGNOSIS — I2510 Atherosclerotic heart disease of native coronary artery without angina pectoris: Secondary | ICD-10-CM | POA: Diagnosis not present

## 2022-10-26 DIAGNOSIS — I10 Essential (primary) hypertension: Secondary | ICD-10-CM | POA: Diagnosis not present

## 2022-10-26 DIAGNOSIS — M5126 Other intervertebral disc displacement, lumbar region: Secondary | ICD-10-CM | POA: Diagnosis not present

## 2022-10-26 DIAGNOSIS — M170 Bilateral primary osteoarthritis of knee: Secondary | ICD-10-CM | POA: Diagnosis not present

## 2022-10-26 DIAGNOSIS — I214 Non-ST elevation (NSTEMI) myocardial infarction: Secondary | ICD-10-CM | POA: Diagnosis not present

## 2022-10-26 DIAGNOSIS — M069 Rheumatoid arthritis, unspecified: Secondary | ICD-10-CM | POA: Diagnosis not present

## 2022-10-28 ENCOUNTER — Telehealth: Payer: Self-pay | Admitting: *Deleted

## 2022-10-28 ENCOUNTER — Encounter: Payer: Medicare HMO | Admitting: *Deleted

## 2022-10-28 DIAGNOSIS — M069 Rheumatoid arthritis, unspecified: Secondary | ICD-10-CM | POA: Diagnosis not present

## 2022-10-28 DIAGNOSIS — G43909 Migraine, unspecified, not intractable, without status migrainosus: Secondary | ICD-10-CM | POA: Diagnosis not present

## 2022-10-28 DIAGNOSIS — R1312 Dysphagia, oropharyngeal phase: Secondary | ICD-10-CM | POA: Diagnosis not present

## 2022-10-28 DIAGNOSIS — I214 Non-ST elevation (NSTEMI) myocardial infarction: Secondary | ICD-10-CM | POA: Diagnosis not present

## 2022-10-28 DIAGNOSIS — M5126 Other intervertebral disc displacement, lumbar region: Secondary | ICD-10-CM | POA: Diagnosis not present

## 2022-10-28 DIAGNOSIS — I2510 Atherosclerotic heart disease of native coronary artery without angina pectoris: Secondary | ICD-10-CM | POA: Diagnosis not present

## 2022-10-28 DIAGNOSIS — E1142 Type 2 diabetes mellitus with diabetic polyneuropathy: Secondary | ICD-10-CM | POA: Diagnosis not present

## 2022-10-28 DIAGNOSIS — I10 Essential (primary) hypertension: Secondary | ICD-10-CM | POA: Diagnosis not present

## 2022-10-28 DIAGNOSIS — Z006 Encounter for examination for normal comparison and control in clinical research program: Secondary | ICD-10-CM

## 2022-10-28 DIAGNOSIS — M170 Bilateral primary osteoarthritis of knee: Secondary | ICD-10-CM | POA: Diagnosis not present

## 2022-10-28 NOTE — Telephone Encounter (Signed)
LM for follow up Orion 4 visit

## 2022-10-31 DIAGNOSIS — E1142 Type 2 diabetes mellitus with diabetic polyneuropathy: Secondary | ICD-10-CM | POA: Diagnosis not present

## 2022-10-31 DIAGNOSIS — I214 Non-ST elevation (NSTEMI) myocardial infarction: Secondary | ICD-10-CM | POA: Diagnosis not present

## 2022-10-31 DIAGNOSIS — G43909 Migraine, unspecified, not intractable, without status migrainosus: Secondary | ICD-10-CM | POA: Diagnosis not present

## 2022-10-31 DIAGNOSIS — I10 Essential (primary) hypertension: Secondary | ICD-10-CM | POA: Diagnosis not present

## 2022-10-31 DIAGNOSIS — M170 Bilateral primary osteoarthritis of knee: Secondary | ICD-10-CM | POA: Diagnosis not present

## 2022-10-31 DIAGNOSIS — M069 Rheumatoid arthritis, unspecified: Secondary | ICD-10-CM | POA: Diagnosis not present

## 2022-10-31 DIAGNOSIS — I2510 Atherosclerotic heart disease of native coronary artery without angina pectoris: Secondary | ICD-10-CM | POA: Diagnosis not present

## 2022-10-31 DIAGNOSIS — M5126 Other intervertebral disc displacement, lumbar region: Secondary | ICD-10-CM | POA: Diagnosis not present

## 2022-10-31 DIAGNOSIS — R1312 Dysphagia, oropharyngeal phase: Secondary | ICD-10-CM | POA: Diagnosis not present

## 2022-11-01 DIAGNOSIS — I10 Essential (primary) hypertension: Secondary | ICD-10-CM | POA: Diagnosis not present

## 2022-11-01 DIAGNOSIS — M069 Rheumatoid arthritis, unspecified: Secondary | ICD-10-CM | POA: Diagnosis not present

## 2022-11-01 DIAGNOSIS — R1312 Dysphagia, oropharyngeal phase: Secondary | ICD-10-CM | POA: Diagnosis not present

## 2022-11-01 DIAGNOSIS — G43909 Migraine, unspecified, not intractable, without status migrainosus: Secondary | ICD-10-CM | POA: Diagnosis not present

## 2022-11-01 DIAGNOSIS — M5126 Other intervertebral disc displacement, lumbar region: Secondary | ICD-10-CM | POA: Diagnosis not present

## 2022-11-01 DIAGNOSIS — E1142 Type 2 diabetes mellitus with diabetic polyneuropathy: Secondary | ICD-10-CM | POA: Diagnosis not present

## 2022-11-01 DIAGNOSIS — I214 Non-ST elevation (NSTEMI) myocardial infarction: Secondary | ICD-10-CM | POA: Diagnosis not present

## 2022-11-01 DIAGNOSIS — I2510 Atherosclerotic heart disease of native coronary artery without angina pectoris: Secondary | ICD-10-CM | POA: Diagnosis not present

## 2022-11-01 DIAGNOSIS — M170 Bilateral primary osteoarthritis of knee: Secondary | ICD-10-CM | POA: Diagnosis not present

## 2022-11-04 DIAGNOSIS — M5126 Other intervertebral disc displacement, lumbar region: Secondary | ICD-10-CM | POA: Diagnosis not present

## 2022-11-04 DIAGNOSIS — E1142 Type 2 diabetes mellitus with diabetic polyneuropathy: Secondary | ICD-10-CM | POA: Diagnosis not present

## 2022-11-04 DIAGNOSIS — R1312 Dysphagia, oropharyngeal phase: Secondary | ICD-10-CM | POA: Diagnosis not present

## 2022-11-04 DIAGNOSIS — I2510 Atherosclerotic heart disease of native coronary artery without angina pectoris: Secondary | ICD-10-CM | POA: Diagnosis not present

## 2022-11-04 DIAGNOSIS — I214 Non-ST elevation (NSTEMI) myocardial infarction: Secondary | ICD-10-CM | POA: Diagnosis not present

## 2022-11-04 DIAGNOSIS — M170 Bilateral primary osteoarthritis of knee: Secondary | ICD-10-CM | POA: Diagnosis not present

## 2022-11-04 DIAGNOSIS — I10 Essential (primary) hypertension: Secondary | ICD-10-CM | POA: Diagnosis not present

## 2022-11-04 DIAGNOSIS — G43909 Migraine, unspecified, not intractable, without status migrainosus: Secondary | ICD-10-CM | POA: Diagnosis not present

## 2022-11-04 DIAGNOSIS — M069 Rheumatoid arthritis, unspecified: Secondary | ICD-10-CM | POA: Diagnosis not present

## 2022-11-05 DIAGNOSIS — M5126 Other intervertebral disc displacement, lumbar region: Secondary | ICD-10-CM | POA: Diagnosis not present

## 2022-11-05 DIAGNOSIS — I214 Non-ST elevation (NSTEMI) myocardial infarction: Secondary | ICD-10-CM | POA: Diagnosis not present

## 2022-11-05 DIAGNOSIS — I2510 Atherosclerotic heart disease of native coronary artery without angina pectoris: Secondary | ICD-10-CM | POA: Diagnosis not present

## 2022-11-05 DIAGNOSIS — I10 Essential (primary) hypertension: Secondary | ICD-10-CM | POA: Diagnosis not present

## 2022-11-05 DIAGNOSIS — M069 Rheumatoid arthritis, unspecified: Secondary | ICD-10-CM | POA: Diagnosis not present

## 2022-11-05 DIAGNOSIS — G43909 Migraine, unspecified, not intractable, without status migrainosus: Secondary | ICD-10-CM | POA: Diagnosis not present

## 2022-11-05 DIAGNOSIS — M170 Bilateral primary osteoarthritis of knee: Secondary | ICD-10-CM | POA: Diagnosis not present

## 2022-11-05 DIAGNOSIS — E1142 Type 2 diabetes mellitus with diabetic polyneuropathy: Secondary | ICD-10-CM | POA: Diagnosis not present

## 2022-11-05 DIAGNOSIS — R1312 Dysphagia, oropharyngeal phase: Secondary | ICD-10-CM | POA: Diagnosis not present

## 2022-11-06 DIAGNOSIS — I214 Non-ST elevation (NSTEMI) myocardial infarction: Secondary | ICD-10-CM | POA: Diagnosis not present

## 2022-11-06 DIAGNOSIS — I10 Essential (primary) hypertension: Secondary | ICD-10-CM | POA: Diagnosis not present

## 2022-11-06 DIAGNOSIS — M069 Rheumatoid arthritis, unspecified: Secondary | ICD-10-CM | POA: Diagnosis not present

## 2022-11-06 DIAGNOSIS — E1142 Type 2 diabetes mellitus with diabetic polyneuropathy: Secondary | ICD-10-CM | POA: Diagnosis not present

## 2022-11-06 DIAGNOSIS — I2510 Atherosclerotic heart disease of native coronary artery without angina pectoris: Secondary | ICD-10-CM | POA: Diagnosis not present

## 2022-11-06 DIAGNOSIS — G43909 Migraine, unspecified, not intractable, without status migrainosus: Secondary | ICD-10-CM | POA: Diagnosis not present

## 2022-11-06 DIAGNOSIS — M5126 Other intervertebral disc displacement, lumbar region: Secondary | ICD-10-CM | POA: Diagnosis not present

## 2022-11-06 DIAGNOSIS — M170 Bilateral primary osteoarthritis of knee: Secondary | ICD-10-CM | POA: Diagnosis not present

## 2022-11-06 DIAGNOSIS — R1312 Dysphagia, oropharyngeal phase: Secondary | ICD-10-CM | POA: Diagnosis not present

## 2022-11-06 NOTE — Research (Signed)
Orion 4 Phone call with patient  Patient doing ok  Had an Nstemi back in April will report to sponsor Reviewed meds  Patient is follow up by phone only  Will call again in November for his follow up

## 2022-11-07 DIAGNOSIS — I214 Non-ST elevation (NSTEMI) myocardial infarction: Secondary | ICD-10-CM | POA: Diagnosis not present

## 2022-11-07 DIAGNOSIS — R1312 Dysphagia, oropharyngeal phase: Secondary | ICD-10-CM | POA: Diagnosis not present

## 2022-11-07 DIAGNOSIS — M069 Rheumatoid arthritis, unspecified: Secondary | ICD-10-CM | POA: Diagnosis not present

## 2022-11-07 DIAGNOSIS — I10 Essential (primary) hypertension: Secondary | ICD-10-CM | POA: Diagnosis not present

## 2022-11-07 DIAGNOSIS — I2510 Atherosclerotic heart disease of native coronary artery without angina pectoris: Secondary | ICD-10-CM | POA: Diagnosis not present

## 2022-11-07 DIAGNOSIS — E1142 Type 2 diabetes mellitus with diabetic polyneuropathy: Secondary | ICD-10-CM | POA: Diagnosis not present

## 2022-11-07 DIAGNOSIS — M170 Bilateral primary osteoarthritis of knee: Secondary | ICD-10-CM | POA: Diagnosis not present

## 2022-11-07 DIAGNOSIS — G43909 Migraine, unspecified, not intractable, without status migrainosus: Secondary | ICD-10-CM | POA: Diagnosis not present

## 2022-11-07 DIAGNOSIS — M5126 Other intervertebral disc displacement, lumbar region: Secondary | ICD-10-CM | POA: Diagnosis not present

## 2022-11-08 ENCOUNTER — Emergency Department (HOSPITAL_COMMUNITY): Payer: Medicare HMO

## 2022-11-08 ENCOUNTER — Encounter (HOSPITAL_COMMUNITY): Payer: Self-pay

## 2022-11-08 ENCOUNTER — Emergency Department (HOSPITAL_COMMUNITY)
Admission: EM | Admit: 2022-11-08 | Discharge: 2022-11-08 | Disposition: A | Discharge status: Home / Self Care | Payer: Medicare HMO | Attending: Emergency Medicine | Admitting: Emergency Medicine

## 2022-11-08 ENCOUNTER — Other Ambulatory Visit: Payer: Self-pay

## 2022-11-08 DIAGNOSIS — Z7901 Long term (current) use of anticoagulants: Secondary | ICD-10-CM | POA: Diagnosis not present

## 2022-11-08 DIAGNOSIS — Z955 Presence of coronary angioplasty implant and graft: Secondary | ICD-10-CM

## 2022-11-08 DIAGNOSIS — E119 Type 2 diabetes mellitus without complications: Secondary | ICD-10-CM | POA: Insufficient documentation

## 2022-11-08 DIAGNOSIS — R079 Chest pain, unspecified: Secondary | ICD-10-CM

## 2022-11-08 DIAGNOSIS — R0789 Other chest pain: Secondary | ICD-10-CM | POA: Diagnosis not present

## 2022-11-08 DIAGNOSIS — I2510 Atherosclerotic heart disease of native coronary artery without angina pectoris: Secondary | ICD-10-CM | POA: Insufficient documentation

## 2022-11-08 DIAGNOSIS — Z794 Long term (current) use of insulin: Secondary | ICD-10-CM | POA: Insufficient documentation

## 2022-11-08 DIAGNOSIS — K6389 Other specified diseases of intestine: Secondary | ICD-10-CM | POA: Diagnosis not present

## 2022-11-08 DIAGNOSIS — I25119 Atherosclerotic heart disease of native coronary artery with unspecified angina pectoris: Secondary | ICD-10-CM | POA: Diagnosis not present

## 2022-11-08 DIAGNOSIS — Z79899 Other long term (current) drug therapy: Secondary | ICD-10-CM | POA: Insufficient documentation

## 2022-11-08 DIAGNOSIS — I10 Essential (primary) hypertension: Secondary | ICD-10-CM | POA: Insufficient documentation

## 2022-11-08 DIAGNOSIS — Z7401 Bed confinement status: Secondary | ICD-10-CM | POA: Diagnosis not present

## 2022-11-08 DIAGNOSIS — K219 Gastro-esophageal reflux disease without esophagitis: Secondary | ICD-10-CM | POA: Diagnosis not present

## 2022-11-08 LAB — CBC
HCT: 42.2 % (ref 39.0–52.0)
Hemoglobin: 14.1 g/dL (ref 13.0–17.0)
MCH: 30.3 pg (ref 26.0–34.0)
MCHC: 33.4 g/dL (ref 30.0–36.0)
MCV: 90.8 fL (ref 80.0–100.0)
Platelets: 369 10*3/uL (ref 150–400)
RBC: 4.65 MIL/uL (ref 4.22–5.81)
RDW: 13.6 % (ref 11.5–15.5)
WBC: 12.5 10*3/uL — ABNORMAL HIGH (ref 4.0–10.5)
nRBC: 0 % (ref 0.0–0.2)

## 2022-11-08 LAB — BASIC METABOLIC PANEL
Anion gap: 13 (ref 5–15)
BUN: 16 mg/dL (ref 8–23)
CO2: 20 mmol/L — ABNORMAL LOW (ref 22–32)
Calcium: 8.8 mg/dL — ABNORMAL LOW (ref 8.9–10.3)
Chloride: 98 mmol/L (ref 98–111)
Creatinine, Ser: 0.93 mg/dL (ref 0.61–1.24)
GFR, Estimated: >60 mL/min (ref >60)
Glucose, Bld: 172 mg/dL — ABNORMAL HIGH (ref 70–99)
Potassium: 3.4 mmol/L — ABNORMAL LOW (ref 3.5–5.1)
Sodium: 131 mmol/L — ABNORMAL LOW (ref 135–145)

## 2022-11-08 LAB — TROPONIN I (HIGH SENSITIVITY)
Troponin I (High Sensitivity): 5 ng/L (ref <18)
Troponin I (High Sensitivity): 5 ng/L (ref <18)

## 2022-11-08 MED ORDER — SUCRALFATE 1 G PO TABS: 1.0000 g | ORAL_TABLET | Freq: Three times a day (TID) | ORAL | 0 refills | Status: DC

## 2022-11-08 MED ORDER — LOPERAMIDE HCL 2 MG PO CAPS
2.0000 mg | ORAL_CAPSULE | Freq: Once | ORAL | Status: AC
  Administered 2022-11-08: 2 mg via ORAL
  Filled 2022-11-08: qty 1

## 2022-11-08 MED ORDER — ALUM & MAG HYDROXIDE-SIMETH 200-200-20 MG/5ML PO SUSP
30.0000 mL | Freq: Once | ORAL | Status: AC
  Administered 2022-11-08: 30 mL via ORAL
  Filled 2022-11-08: qty 30

## 2022-11-08 MED ORDER — NITROGLYCERIN 0.4 MG SL SUBL: 0.4000 mg | SUBLINGUAL_TABLET | SUBLINGUAL | Status: DC | PRN

## 2022-11-08 NOTE — ED Notes (Signed)
Pt is a&ox4, pwd. Pt reattached to monitor/vitals. Side rails up x 2, call light in hands. Pt denies any complaints.

## 2022-11-08 NOTE — Discharge Instructions (Addendum)
You have been evaluated for your symptoms.  Fortunately your pain is not likely to be due to your heart.  This could be esophagitis or reflux.  Please take Carafate as prescribed.  Continue to take your Protonix.  Follow-up closely with your doctor for further care.

## 2022-11-08 NOTE — ED Provider Notes (Signed)
[facility] AT [facility] Provider Note   CSN: [id number] Arrival date & time: 11/08/22  1110     History  No chief complaint on file.   William Hunter is a 77 y.o. male.  The history is provided by the patient and medical records. No language interpreter was used.     77 year old male significant history of CAD since with prior MI, diabetes, hypertension, hyperlipidemia, coronary artery spasm brought here via EMS from home with complaints of chest pain.  Patient was hospitalized a month ago for chest pain.  Found to have severe three-vessel disease on heart catheterization and patient underwent PCI with drug-eluting stents to mid LAD and was placed on Plavix and Eliquis.  Patient states he was doing fine however he developed chest pain since last night which has kept him up all night.  Pain is described as a thumping sensation to his chest nonradiating with some associate shortness of breath.  Denies any lightheadedness or dizziness no nausea or diaphoresis.  Pain feels similar to prior heart attack that he has last month.  He did report some improvement of his discomfort after receiving nitro via EMS.  Currently rates pain as 4 out of 10.  Denies any fever chills productive cough abdominal pain.  Home Medications Prior to Admission medications   Medication Sig Start Date End Date Taking? Authorizing Provider  amLODipine (NORVASC) 5 MG tablet Take 1 tablet (5 mg total) by mouth daily. 10/04/22   Lanae Boast, MD  apixaban (ELIQUIS) 5 MG TABS tablet Take 1 tablet (5 mg total) by mouth 2 (two) times daily. 10/03/22   Lanae Boast, MD  BD PEN NEEDLE MICRO U/F 32G X 6 MM MISC Inject into the skin 3 (three) times daily. 09/16/22   [provider]  BD PEN NEEDLE NANO 2ND GEN 32G X 4 MM MISC  06/07/22   [provider]  carbidopa-levodopa (SINEMET IR) 25-100 MG tablet Take 1 tablet by mouth 3 (three) times daily. 04/10/22   Levert Feinstein, MD  clopidogrel  (PLAVIX) 75 MG tablet Take 1 tablet (75 mg total) by mouth daily with breakfast. 10/04/22   Lanae Boast, MD  Continuous Glucose Sensor (FREESTYLE LIBRE 2 SENSOR) MISC Place onto the skin. 09/18/22   [provider]  folic acid (FOLVITE) 1 MG tablet Take 1 tablet (1 mg total) by mouth daily. 09/26/22   Rice, Jamesetta Orleans, MD  GVOKE HYPOPEN 2-PACK 1 MG/0.2ML SOAJ Inject into the skin. 04/11/22   [provider]  HUMIRA, 2 PEN, 40 MG/0.4ML PNKT Inject 0.4 mLs into the muscle every 14 (fourteen) days. 09/30/22   Fuller Plan, MD  hydroxychloroquine (PLAQUENIL) 200 MG tablet Take 1 tablet (200 mg total) by mouth daily. 09/26/22   Rice, Jamesetta Orleans, MD  insulin aspart (NOVOLOG) 100 UNIT/ML injection 0-5 Units, Subcutaneous, Daily at bedtime: HS scale CBG < 70: implement hypoglycemia measures CBG 70 - 120: 0 units CBG 121 - 150: 0 units CBG 151 - 200: 0 units CBG 201 - 250: 2 units CBG 251 - 300: 3 units CBG 301 - 350: 4 units CBG 351 - 400: 5 units CBG > 400: call MD and obtain STAT lab verification 11/29/21   Maretta Bees, MD  insulin aspart (NOVOLOG) 100 UNIT/ML injection 0-15 Units, Subcutaneous, 3 times daily with meals CBG < 70: Implement Hypoglycemia Standing Orders and refer to Hypoglycemia Standing Orders sidebar report CBG 70 - 120: 0 units CBG 121 - 150:  2 units CBG 151 - 200: 3 units CBG 201 - 250: 5 units CBG 251 - 300: 8 units CBG 301 - 350: 11 units CBG 351 - 400: 15 units CBG > 400: call MD and obtain STAT lab verification 11/29/21   Maretta Bees, MD  isosorbide mononitrate (IMDUR) 30 MG 24 hr tablet Take 0.5 tablets (15 mg total) by mouth daily. 10/04/22   Lanae Boast, MD  meclizine (ANTIVERT) 25 MG tablet Take 25 mg by mouth 3 (three) times daily as needed for dizziness.    [provider]  methotrexate (RHEUMATREX) 2.5 MG tablet Take 6 tablets (15 mg total) by mouth once a week. Caution:Chemotherapy. Protect from light. 09/26/22   Rice, Jamesetta Orleans, MD   metoprolol succinate (TOPROL-XL) 25 MG 24 hr tablet Take 1 tablet (25 mg total) by mouth daily. 10/03/22   Lanae Boast, MD  MYRBETRIQ 25 MG TB24 tablet Take 25 mg by mouth daily. 09/12/22   [provider]  nitroGLYCERIN (NITROSTAT) 0.4 MG SL tablet Place 1 tablet (0.4 mg total) under the tongue every 5 (five) minutes x 3 doses as needed for chest pain. 10/03/22   Lanae Boast, MD  pantoprazole (PROTONIX) 40 MG tablet Take 1 tablet (40 mg total) by mouth daily at 6 (six) AM. 10/03/22   Lanae Boast, MD  Study - ORION 4 - inclisiran 300 mg/1.37mL or placebo SQ injection (PI-Stuckey) Inject 1.5 mLs (300 mg total) into the skin every 6 (six) months. Patient not taking: Reported on 09/26/2022 09/06/21   Herby Abraham, MD      Allergies    Codeine, Penicillins, Prednisone, Wool alcohol [lanolin], Empagliflozin, Penicillin g, and Statins    Review of Systems   Review of Systems  All other systems reviewed and are negative.   Physical Exam Updated Vital Signs BP 108/66   Pulse (!) 102   Temp 97.6 F (36.4 C) (Oral)   Resp 20   SpO2 95%  Physical Exam Vitals and nursing note reviewed.  Constitutional:      General: He is not in acute distress.    Appearance: He is well-developed.  HENT:     Head: Atraumatic.  Eyes:     Conjunctiva/sclera: Conjunctivae normal.  Cardiovascular:     Rate and Rhythm: Normal rate and regular rhythm.     Pulses: Normal pulses.     Heart sounds: Normal heart sounds.  Pulmonary:     Effort: Pulmonary effort is normal.     Breath sounds: Normal breath sounds. No wheezing, rhonchi or rales.  Chest:     Chest wall: No tenderness.  Abdominal:     Palpations: Abdomen is soft.     Tenderness: There is no abdominal tenderness.  Musculoskeletal:     Cervical back: Neck supple.     Right lower leg: No edema.     Left lower leg: No edema.  Skin:    Findings: No rash.  Neurological:     Mental Status: He is alert.     ED Results / Procedures /  Treatments   Labs (all labs ordered are listed, but only abnormal results are displayed) Labs Reviewed  BASIC METABOLIC PANEL - Abnormal; Notable for the following components:      Result Value   Sodium 131 (*)    Potassium 3.4 (*)    CO2 20 (*)    Glucose, Bld 172 (*)    Calcium 8.8 (*)    All other components within normal limits  CBC - Abnormal; Notable for the following components:   WBC 12.5 (*)    All other components within normal limits  TROPONIN I (HIGH SENSITIVITY)  TROPONIN I (HIGH SENSITIVITY)    EKG EKG Interpretation  Date/Time:  Friday Nov 08 2022 11:16:26 EDT Ventricular Rate:  94 PR Interval:  173 QRS Duration: 86 QT Interval:  337 QTC Calculation: 422 R Axis:   25 Text Interpretation: Sinus rhythm Abnormal R-wave progression, early transition Inferior infarct, old When compared to 10/03/22 Inferior Q waves noted Confirmed by Alvino Blood ([id number]) on 11/08/2022 11:54:55 AM  Radiology DG Chest Port 1 View  Result Date: 11/08/2022 CLINICAL DATA:  Chest pain. EXAM: PORTABLE CHEST 1 VIEW COMPARISON:  09/29/2022. FINDINGS: Rotated patient with kyphotic view. Within this limitation, no consolidation or pulmonary edema. Stable cardiac and mediastinal contours. No pleural effusion or pneumothorax. Gaseous distention of the bowel within the upper abdomen. IMPRESSION: 1.  No evidence of acute cardiopulmonary disease. 2. Gaseous distention of the bowel within the upper abdomen. Electronically Signed   By: Orvan Falconer M.D.   On: 11/08/2022 12:33    Procedures Procedures    Medications Ordered in ED Medications  nitroGLYCERIN (NITROSTAT) SL tablet 0.4 mg (has no administration in time range)  alum & mag hydroxide-simeth (MAALOX/MYLANTA) 200-200-20 MG/5ML suspension 30 mL (30 mLs Oral Given 11/08/22 1429)    ED Course/ Medical Decision Making/ A&P                             Medical Decision Making Amount and/or Complexity of Data Reviewed Labs:  ordered. Radiology: ordered.  Risk Prescription drug management.   BP 126/69   Pulse 78   Temp 98 F (36.7 C) (Oral)   Resp 20   SpO2 99%   56:51 AM  77 year old male significant history of CAD since with prior MI, diabetes, hypertension, hyperlipidemia, coronary artery spasm brought here via EMS from home with complaints of chest pain.  Patient was hospitalized a month ago for chest pain.  Found to have severe three-vessel disease on heart catheterization and patient underwent PCI with drug-eluting stents to mid LAD and was placed on Plavix and Eliquis.  Patient states he was doing fine however he developed chest pain since last night which has kept him up all night.  Pain is described as a thumping sensation to his chest nonradiating with some associate shortness of breath.  Denies any lightheadedness or dizziness no nausea or diaphoresis.  Pain feels similar to prior heart attack that he has last month.  He did report some improvement of his discomfort after receiving nitro via EMS.  Currently rates pain as 4 out of 10.  Denies any fever chills productive cough abdominal pain.  On exam, patient is resting comfortably appears to be in no acute discomfort.  Heart with normal rate and rhythm, lungs clear to auscultation bilaterally abdomen is soft nontender intact radial pulse bilaterally no peripheral edema noted.  No chest wall tenderness on palpation.  Vital signs notable for blood pressure of 108/66.  Patient is mildly tachycardic with heart rate of 102.  Patient's chest discomfort is concerning for ACS as given that his pain felt similar to prior heart attack that he had last month.  He did report improvement of his symptoms with nitro provided by the ambulance.  Will provide additional nitro.  Patient report he has take his Plavix and Eliquis earlier in the day.  -Labs ordered,  independently viewed and interpreted by me.  Labs remarkable for WBC 12.5.  normal trop -The patient was  maintained on a cardiac monitor.  I personally viewed and interpreted the cardiac monitored which showed an underlying rhythm of: NSR -Imaging independently viewed and interpreted by me and I agree with radiologist's interpretation.  Result remarkable for CXR without acute changes.  Gaseous distention of the bowel.  Pt however report normal BM and able to pass flatus -This patient presents to the ED for concern of chest pain, this involves an extensive number of treatment options, and is a complaint that carries with it a high risk of complications and morbidity.  The differential diagnosis includes ACS, Gastritis, GERD, MSK, PE, PNA -Co morbidities that complicate the patient evaluation includes CAD, DM -Treatment includes SL nitro -Reevaluation of the patient after these medicines showed that the patient improved -PCP office notes or outside notes reviewed -Discussion with specialist cardmaster Trish who will request card team to evaluate pt and give recs -Escalation to admission/observation considered: patients feels much better, is comfortable with discharge, and will follow up with PCP -Prescription medication considered, patient comfortable with carafate -Social Determinant of Health considered   3:45 PM Cardiology has seen and evaluated patient and felt this presentation is not consistent with cardiac cause.  GI cocktail was given with improvement of symptoms.  Suspect GI cause.  Patient discharged home with Carafate and outpatient follow-up.          Final Clinical Impression(s) / ED Diagnoses Final diagnoses:  Atypical chest pain  Gastroesophageal reflux disease, unspecified whether esophagitis present    Rx / DC Orders ED Discharge Orders          Ordered    sucralfate (CARAFATE) 1 g tablet  3 times daily with meals & bedtime        11/08/22 1546              Fayrene Helper, PA-C 11/08/22 1547    Lonell Grandchild, MD 11/10/22 1101

## 2022-11-08 NOTE — Consult Note (Addendum)
Cardiology Consultation   Patient ID: DELANCEY TALAMO MRN: [id number]; DOB: [birth year]  Admit date: 11/08/2022 Date of Consult: 11/08/2022  PCP:  Irena Reichmann, DO   [facility] Providers Cardiologist:  Donato Schultz, MD        Patient Profile:   TRINIDY LORIG is a 77 y.o. male with a hx of CAD, recurrent DVT/PE on Eliquis, hypertension, hyperlipidemia intolerant of statins and DM2 who is being seen 11/08/2022 for the evaluation of chest pain at the request of Dr. Suezanne Jacquet.  History of Present Illness:   Mr. Rairigh is a 77 year old male with past medical history of CAD, recurrent DVT/PE on Eliquis, hypertension, hyperlipidemia intolerant of statins and DM2.  Patient had MI in 1981 with no CAD on cath, this suggested coronary vasospasm.  He previously resides in a skilled nursing facility.  Most recently, patient was admitted in April 2024 with chest pain.  Serial enzyme was elevated at 534--> 357.  Echocardiogram obtained on 09/30/2022 showed EF 60 to 65%, no regional wall motion abnormality, grade 1 DD.  Subsequent cardiac catheterization performed on the same day showed 80% followed by 75% ramus lesion, 75% mid LAD lesion, 50% ostial to proximal LAD lesion, 60% ostial to proximal left circumflex lesion, 40% mid left main to ostial LAD, 50% ostial to proximal RCA lesion, 75% RPDA lesion, severe right subclavian tortuosity, right femoral approach was used.  He was seen by Dr. Dorris Fetch who felt patient was very weak at baseline and could not support himself on his legs without assistance.  He had limited walking distance with walker.  Those factors will impede his ability to recover from CABG.  Patient returned back to the Cath Lab on 10/02/2022 and eventually underwent staged PCI of the 75% mid LAD lesion with Synergy XD 3.5 x 38 mm DES postdilated to 3.75 mm.  Ramus lesions were treated medically.  Postprocedure, he was placed on aspirin, Plavix and Eliquis.  Aspirin was eventually  discontinued prior to discharge.  Patient was referred to lipid clinic 25 mg Toprol-XL was added for antianginal purposes.  Note, CTA of the chest obtained on 09/29/2022 demonstrated no evidence of PE however does show mild thickening of the distal esophagus which can be seen in the setting of esophagitis.  Since discharge, patient has returned home to live with wife.  He is extremely weak.  He has not been able to get up and walk at all.  He is laying in bed 24/7.  He wears adult diapers for urine and defecation.  He has been compliant with his medication.  He was in his usual state of health until 1 AM this morning when he woke up with substernal chest discomfort.  He described it as a thumping sensation but not a palpitation.  Chest discomfort was initially severe however improved gradually.  He currently has 3 out of 10 chest discomfort.  He initially thought it was acid reflux and wanted to wait it out.  He eventually sought medical attention around 10 AM this morning.  First troponin was obtained roughly 10 hours after his onset of constant chest discomfort and it came back negative.  Second troponin obtained 12 hours after onset of the chest discomfort also came back negative.  EKG showed normal sinus rhythm with Q waves in the inferior leads.  White blood cell count is elevated 12.5.  However patient denies any fever, chills or cough.  He denies any dysuria.  Sodium borderline low at 131, potassium 3.4.  Creatinine 0.93.  Patient says his current chest discomfort is different from the previous angina.  He is previous angina was a pressure over the left chest whereas the current chest discomfort is right in the middle.  Cardiology service consulted for chest pain.  Past Medical History:  Diagnosis Date   Anginal pain ([name]    Arthritis    "fingers" (03/09/2012)   Cold feet    "from my diabetes; they stay cold" (03/09/2012)   Coronary artery disease    Coronary artery spasm [facility])    since age 86 Dr  Aleen Campi   COVID 07/06/2019   hospitalizied   Dyslipidemia    Femoral DVT (deep venous thrombosis) ([name]    left   Headache(784.0)    Hx of gallstones    Dr Derrell Lolling lap cholecystectomy   Hx of plastic surgery    plastic surgery following a fall off a loading dock ,lost  all his upper teeth    Hypertension    MI, old    at age 79   Migraines    Pneumonia ~ 1962; 1970's   "double"; "regular" (03/09/2012)   Rheumatoid arthritis ([name]    Dr Dierdre Forth    Slipped cervical disc 06/10/2006   Dr Patsi Sears in the past/ Dr Larita Fife , chiropractor in 2008   Thyroid disease    Dr Lucianne Muss   Type II diabetes mellitus [facility])     Past Surgical History:  Procedure Laterality Date   CARDIAC CATHETERIZATION     x 3 with NO stents    CHOLECYSTECTOMY  03/11/2012   Procedure: LAPAROSCOPIC CHOLECYSTECTOMY WITH INTRAOPERATIVE CHOLANGIOGRAM;  Surgeon: Ernestene Mention, MD;  Location: [facility] OR;  Service: General;  Laterality: N/A;   CORONARY STENT INTERVENTION N/A 10/02/2022   Procedure: CORONARY STENT INTERVENTION;  Surgeon: Corky Crafts, MD;  Location: [facility] INVASIVE CV LAB;  Service: Cardiovascular;  Laterality: N/A;   CORONARY ULTRASOUND/IVUS N/A 10/02/2022   Procedure: Coronary Ultrasound/IVUS;  Surgeon: Corky Crafts, MD;  Location: [facility] INVASIVE CV LAB;  Service: Cardiovascular;  Laterality: N/A;   KYPHOPLASTY N/A 05/30/2021   Procedure: THORACIC FIVE KYPHOPLASTY;  Surgeon: Coletta Memos, MD;  Location: [facility] OR;  Service: Neurosurgery;  Laterality: N/A;  THORACIC FIVE KYPHOPLASTY   LEFT HEART CATH AND CORONARY ANGIOGRAPHY N/A 09/30/2022   Procedure: LEFT HEART CATH AND CORONARY ANGIOGRAPHY;  Surgeon: Corky Crafts, MD;  [facility] INVASIVE CV LAB;  Service: Cardiovascular;  Laterality: N/A;   PENILE PROSTHESIS IMPLANT  1983     Home Medications:  Prior to Admission medications   Medication Sig Start Date End Date Taking? Authorizing Provider  amLODipine (NORVASC) 5 MG tablet Take 1  tablet (5 mg total) by mouth daily. 10/04/22   Lanae Boast, MD  apixaban (ELIQUIS) 5 MG TABS tablet Take 1 tablet (5 mg total) by mouth 2 (two) times daily. 10/03/22   Lanae Boast, MD  BD PEN NEEDLE MICRO U/F 32G X 6 MM MISC Inject into the skin 3 (three) times daily. 09/16/22   [provider]  BD PEN NEEDLE NANO 2ND GEN 32G X 4 MM MISC  06/07/22   [provider]  carbidopa-levodopa (SINEMET IR) 25-100 MG tablet Take 1 tablet by mouth 3 (three) times daily. 04/10/22   Levert Feinstein, MD  clopidogrel (PLAVIX) 75 MG tablet Take 1 tablet (75 mg total) by mouth daily with breakfast. 10/04/22   Lanae Boast, MD  Continuous Glucose Sensor (FREESTYLE LIBRE 2 SENSOR) MISC Place onto the skin. 09/18/22  [provider]  folic acid (FOLVITE) 1 MG tablet Take 1 tablet (1 mg total) by mouth daily. 09/26/22   Rice, Jamesetta Orleans, MD  GVOKE HYPOPEN 2-PACK 1 MG/0.2ML SOAJ Inject into the skin. 04/11/22   [provider]  HUMIRA, 2 PEN, 40 MG/0.4ML PNKT Inject 0.4 mLs into the muscle every 14 (fourteen) days. 09/30/22   Fuller Plan, MD  hydroxychloroquine (PLAQUENIL) 200 MG tablet Take 1 tablet (200 mg total) by mouth daily. 09/26/22   Rice, Jamesetta Orleans, MD  insulin aspart (NOVOLOG) 100 UNIT/ML injection 0-5 Units, Subcutaneous, Daily at bedtime: HS scale CBG < 70: implement hypoglycemia measures CBG 70 - 120: 0 units CBG 121 - 150: 0 units CBG 151 - 200: 0 units CBG 201 - 250: 2 units CBG 251 - 300: 3 units CBG 301 - 350: 4 units CBG 351 - 400: 5 units CBG > 400: call MD and obtain STAT lab verification 11/29/21   Maretta Bees, MD  insulin aspart (NOVOLOG) 100 UNIT/ML injection 0-15 Units, Subcutaneous, 3 times daily with meals CBG < 70: Implement Hypoglycemia Standing Orders and refer to Hypoglycemia Standing Orders sidebar report CBG 70 - 120: 0 units CBG 121 - 150: 2 units CBG 151 - 200: 3 units CBG 201 - 250: 5 units CBG 251 - 300: 8 units CBG 301 - 350: 11 units CBG 351 - 400: 15  units CBG > 400: call MD and obtain STAT lab verification 11/29/21   Maretta Bees, MD  isosorbide mononitrate (IMDUR) 30 MG 24 hr tablet Take 0.5 tablets (15 mg total) by mouth daily. 10/04/22   Lanae Boast, MD  meclizine (ANTIVERT) 25 MG tablet Take 25 mg by mouth 3 (three) times daily as needed for dizziness.    [provider]  methotrexate (RHEUMATREX) 2.5 MG tablet Take 6 tablets (15 mg total) by mouth once a week. Caution:Chemotherapy. Protect from light. 09/26/22   Rice, Jamesetta Orleans, MD  metoprolol succinate (TOPROL-XL) 25 MG 24 hr tablet Take 1 tablet (25 mg total) by mouth daily. 10/03/22   Lanae Boast, MD  MYRBETRIQ 25 MG TB24 tablet Take 25 mg by mouth daily. 09/12/22   [provider]  nitroGLYCERIN (NITROSTAT) 0.4 MG SL tablet Place 1 tablet (0.4 mg total) under the tongue every 5 (five) minutes x 3 doses as needed for chest pain. 10/03/22   Lanae Boast, MD  pantoprazole (PROTONIX) 40 MG tablet Take 1 tablet (40 mg total) by mouth daily at 6 (six) AM. 10/03/22   Lanae Boast, MD  Study - ORION 4 - inclisiran 300 mg/1.12mL or placebo SQ injection (PI-Stuckey) Inject 1.5 mLs (300 mg total) into the skin every 6 (six) months. Patient not taking: Reported on 09/26/2022 09/06/21   Herby Abraham, MD    Inpatient Medications: Scheduled Meds:  ORION 4 inclisiran or placebo  300 mg Subcutaneous Q6 months   Continuous Infusions:  PRN Meds: nitroGLYCERIN  Allergies:    Allergies  Allergen Reactions   Codeine Swelling    Other reaction(s): rash   Penicillins Hives and Swelling    Has patient had a PCN reaction causing immediate rash, facial/tongue/throat swelling, SOB or lightheadedness with hypotension: Yes Has patient had a PCN reaction causing severe rash involving mucus membranes or skin necrosis: No Has patient had a PCN reaction that required hospitalization No Has patient had a PCN reaction occurring within the last 10 years: No If all of the above answers are  "NO", then may proceed  with Cephalosporin use.    Prednisone Hives    Other reaction(s): hives   Wool Alcohol [Lanolin] Hives   Empagliflozin Nausea And Vomiting    Other reaction(s): nausea and vomiting   Penicillin G     Other reaction(s): rash   Statins     Other reaction(s): myopathy    Social History:   Social History   Socioeconomic History   Marital status: Married    Spouse name: Talbert Forest   Number of children: 0   Years of education: 14   Highest education level: Not on file  Occupational History    Comment: [employer]  Tobacco Use   Smoking status: Never    Passive exposure: Never   Smokeless tobacco: Never  Vaping Use   Vaping Use: Never used  Substance and Sexual Activity   Alcohol use: No   Drug use: No   Sexual activity: Yes  Other Topics Concern   Not on file  Social History Narrative   Lives with wife   Caffeine- Diet Coke 5 daily   Social Determinants of Health   Financial Resource Strain: Not on file  Food Insecurity: No Food Insecurity (09/29/2022)   Hunger Vital Sign    Worried About Running Out of Food in the Last Year: Never true    Ran Out of Food in the Last Year: Never true  Transportation Needs: No Transportation Needs (09/29/2022)   PRAPARE - Administrator, Civil Service (Medical): No    Lack of Transportation (Non-Medical): No  Physical Activity: Not on file  Stress: Not on file  Social Connections: Not on file  Intimate Partner Violence: Not At Risk (09/29/2022)   Humiliation, Afraid, Rape, and Kick questionnaire    Fear of Current or Ex-Partner: No    Emotionally Abused: No    Physically Abused: No    Sexually Abused: No    Family History:    Family History  Problem Relation Age of Onset   Heart failure Mother    Cancer - Other Mother        breast   Heart disease Father    Cancer - Other Sister        tongue cancer   Heart attack Sister      ROS:  Please see the history of present illness.   All  other ROS reviewed and negative.     Physical Exam/Data:   Vitals:   11/08/22 1316 11/08/22 1317 11/08/22 1400 11/08/22 1415  BP: 125/70  131/79 126/72  Pulse: 86 88 84 79  Resp: (!) 21 20 16 18   Temp:      TempSrc:      SpO2: 98% 98% 98% 100%   No intake or output data in the 24 hours ending 11/08/22 1452    10/02/2022    5:46 AM 09/30/2022    4:06 AM 09/29/2022    6:42 AM  Last 3 Weights  Weight (lbs) 180 lb 12.4 oz 173 lb 11.6 oz 173 lb  Weight (kg) 82 kg 78.8 kg 78.472 kg     There is no height or weight on file to calculate BMI.  General:  Well nourished, well developed, in no acute distress HEENT: normal Neck: no JVD Vascular: No carotid bruits; Distal pulses 2+ bilaterally Cardiac:  normal S1, S2; RRR; no murmur  Lungs:  clear to auscultation bilaterally, no wheezing, rhonchi or rales  Abd: soft, nontender, no hepatomegaly  Ext: no edema Musculoskeletal:  No deformities, BUE and  BLE strength normal and equal Skin: warm and dry  Neuro:  CNs 2-12 intact, no focal abnormalities noted Psych:  Normal affect   EKG:  The EKG was personally reviewed and demonstrates: Normal sinus rhythm, Q waves in the inferior lead. Telemetry:  Telemetry was personally reviewed and demonstrates: Normal sinus rhythm, no significant ventricular ectopy  Relevant CV Studies:  Echo 09/30/2022  1. Left ventricular ejection fraction, by estimation, is 60 to 65%. The  left ventricle has normal function. The left ventricle has no regional  wall motion abnormalities. There is mild concentric left ventricular  hypertrophy. Left ventricular diastolic  parameters are consistent with Grade I diastolic dysfunction (impaired  relaxation).   2. Right ventricular systolic function is normal. The right ventricular  size is normal.   3. Left atrial size was mildly dilated.   4. The mitral valve is normal in structure. No evidence of mitral valve  regurgitation. No evidence of mitral stenosis.   5. The  aortic valve is tricuspid. There is mild calcification of the  aortic valve. Aortic valve regurgitation is not visualized. Aortic valve  sclerosis/calcification is present, without any evidence of aortic  stenosis. Aortic valve mean gradient  measures 3.0 mmHg. Aortic valve Vmax measures 1.13 m/s.   6. The inferior vena cava is normal in size with greater than 50%  respiratory variability, suggesting right atrial pressure of 3 mmHg.     Cath 09/30/2022   Ramus-2 lesion is 75% stenosed.   Ramus-1 lesion is 80% stenosed.   Mid LAD lesion is 75% stenosed.   Ost LAD to Prox LAD lesion is 50% stenosed.   Ost Cx to Prox Cx lesion is 60% stenosed.   Mid LM to Ost LAD lesion is 40% stenosed.   Ost RCA to Prox RCA lesion is 50% stenosed.   RPDA lesion is 75% stenosed.   LV end diastolic pressure is normal.   There is no aortic valve stenosis.   Severe right subclavian tortuosity.  85 cm destination sheath not available today.  Right groin approach used.   Moderate disease in the distal left main, ostial LAD and ostial circumflex.  Severe disease in the large ramus and mid LAD.  Will obtain surgical consultation.  I discussed the findings with the wife and specifically asked about the DNR status.  It was meant to be more related to 'he would not want to live on a vent if he had brain damage.'    If he was not a candidate for CABG due to other comorbidities, could consider PCI of the mid LAD and medical therapy of the ramus vessel and other moderate disease.     Cath 10/02/2022   Mid LAD lesion is 75% stenosed.  A drug-eluting stent was successfully placed using a SYNERGY XD 3.50X38, postdilated to 3.75 mm and optimized with intravascular ultrasound.   Post intervention, there is a 0% residual stenosis.   Ramus-2 lesion is 75% stenosed.   Ramus-1 lesion is 80% stenosed.   Ost Cx to Prox Cx lesion is 50% stenosed- eccentric, most notable in caudal view.   Mid LM to Ost LAD lesion is 40%  stenosed.  Smallest Cross-sectional area 6.6 mm by intravascular ultrasound, not significant.   Ost LAD to Prox LAD lesion is 50% stenosed.  Cross-sectional area 4.2 mm, not significant, by intravascular ultrasound for a very short segment, just before the left main.  The remainder of the LAD cross-sectional area is significantly larger.   A drug-eluting stent  was successfully placed using a SYNERGY XD 3.50X38.   Continue dual antiplatelet therapy along with aggressive secondary prevention.  Okay to switch to Brilinta if that has less drug interactions with his other neurologic medicines.  Will check with pharmacy prior to discharge.    Diagnostic Dominance: Right  Intervention     Laboratory Data:  High Sensitivity Troponin:   Recent Labs  Lab 11/08/22 1120 11/08/22 1320  TROPONINIHS 5 5     Chemistry Recent Labs  Lab 11/08/22 1120  NA 131*  K 3.4*  CL 98  CO2 20*  GLUCOSE 172*  BUN 16  CREATININE 0.93  CALCIUM 8.8*  GFRNONAA >60  ANIONGAP 13    No results for input(s): "PROT", "ALBUMIN", "AST", "ALT", "ALKPHOS", "BILITOT" in the last 168 hours. Lipids No results for input(s): "CHOL", "TRIG", "HDL", "LABVLDL", "LDLCALC", "CHOLHDL" in the last 168 hours.  Hematology Recent Labs  Lab 11/08/22 1120  WBC 12.5*  RBC 4.65  HGB 14.1  HCT 42.2  MCV 90.8  MCH 30.3  MCHC 33.4  RDW 13.6  PLT 369   Thyroid No results for input(s): "TSH", "FREET4" in the last 168 hours.  BNPNo results for input(s): "BNP", "PROBNP" in the last 168 hours.  DDimer No results for input(s): "DDIMER" in the last 168 hours.   Radiology/Studies:  [facility] Chest Port 1 View  Result Date: 11/08/2022 CLINICAL DATA:  Chest pain. EXAM: PORTABLE CHEST 1 VIEW COMPARISON:  09/29/2022. FINDINGS: Rotated patient with kyphotic view. Within this limitation, no consolidation or pulmonary edema. Stable cardiac and mediastinal contours. No pleural effusion or pneumothorax. Gaseous distention of the bowel  within the upper abdomen. IMPRESSION: 1.  No evidence of acute cardiopulmonary disease. 2. Gaseous distention of the bowel within the upper abdomen. Electronically Signed   By: Orvan Falconer M.D.   On: 11/08/2022 12:33     Assessment and Plan:   Chest pain: Characteristics of the chest pain is different.  The location is also different from the previous angina.  2 serial troponin obtained 10-hour and 12 hours after onset of the constant chest pain came back negative.  EKG showed sinus rhythm with Q waves in the inferior lead, although somewhat different from the last EKG, however similar to the previous EKG.  Will review EKG with MD.  Will give GI cocktail to see if has improvement.  Patient was previously diagnosed with possible esophagitis based on distal esophageal thickening seen on CT image last month.  CAD: Recently underwent DES to LAD, OM lesions were treated medically.  On Plavix monotherapy given the need for Eliquis.  Not on aspirin.  Last dose of Plavix was yesterday around noon.  Recurrent DVT/PE: On Eliquis.  Last dose of Eliquis was around 8 AM this morning.  Hypertension: Blood pressure stable  Hyperlipidemia: Patient was previously referred to lipid clinic  DM 2  Severe deconditioning: Patient used to reside in a skilled nursing facility last year and was only was able to walk short distance with walker, since the recent discharge, he has not been able to walk at all.  He is getting physical therapy twice a week.   Risk Assessment/Risk Scores:       For questions or updates, please contact [facility] Please consult [email or web address] for contact info under    Signed, Azalee Course, [place]  11/08/2022 2:52 PM   ATTENDING ATTESTATION  I have seen, examined and evaluated the patient this afternoon in the ER along with Azalee Course, PA.  After reviewing all the available data and chart, we discussed the patients laboratory, study & physical findings as well as symptoms in  detail.  I agree with his findings, examination as well as impression recommendations as per our discussion.     Patient with no documented coronary history with recent non-STEMI and LAD PCI with existing relatively small caliber OM branch with severe disease.  The initial presenting symptom at that time was left-sided parasternal chest pressure that radiated throughout the chest associated dyspnea.  He has not had the symptoms since his PCI.  At baseline he is essentially bedbound, unable to move.  He is under quite a bit of stress right now with his wife having had  an automobile accident and is in the ICU with hip and ankle fractures.  He cannot see her because he cannot get into the hospital. He has had ongoing epigastric lower sternal chest pain now for 14 hours and has had 2 troponin levels that were basically undetectable an EKG that is like I am indicative of the placement being different.  10+ hours of ongoing pain with negative troponins would be very unlikely be cardiac in nature.  The description of his symptoms is very different than his cardiac pain.  He describes it as a pulsating pain.  His chest x-ray shows large gastric air bubble which is probably the location as he has pretty significant tenderness to palpation. I do not think any additional cardiac evaluation is required.  Does not need cardiac evaluation if he does get admitted.  Will sign off.     Marykay Lex, MD, MS Bryan Lemma, M.D., M.S. Interventional Cardiologist  [facility]  Pager # [phone] Phone # [phone] [address] [place], [place] [address]

## 2022-11-08 NOTE — ED Triage Notes (Signed)
Pt to ED via EMS from home with c/o CP and SOB x2days. EMS gave 0.4mg  of Nitro and 324mg  of ASA PTA. Pt reports having a MI in April and has stents placed. Pt reports taking all medications as prescribed. Reports CP was 10/10 before Nitro and is currently 5/10.

## 2022-11-11 ENCOUNTER — Other Ambulatory Visit: Payer: Self-pay | Admitting: Internal Medicine

## 2022-11-11 ENCOUNTER — Ambulatory Visit: Payer: Medicare HMO | Admitting: Internal Medicine

## 2022-11-11 DIAGNOSIS — M069 Rheumatoid arthritis, unspecified: Secondary | ICD-10-CM

## 2022-11-12 DIAGNOSIS — G43909 Migraine, unspecified, not intractable, without status migrainosus: Secondary | ICD-10-CM | POA: Diagnosis not present

## 2022-11-12 DIAGNOSIS — M170 Bilateral primary osteoarthritis of knee: Secondary | ICD-10-CM | POA: Diagnosis not present

## 2022-11-12 DIAGNOSIS — M5126 Other intervertebral disc displacement, lumbar region: Secondary | ICD-10-CM | POA: Diagnosis not present

## 2022-11-12 DIAGNOSIS — I214 Non-ST elevation (NSTEMI) myocardial infarction: Secondary | ICD-10-CM | POA: Diagnosis not present

## 2022-11-12 DIAGNOSIS — M069 Rheumatoid arthritis, unspecified: Secondary | ICD-10-CM | POA: Diagnosis not present

## 2022-11-12 DIAGNOSIS — R29898 Other symptoms and signs involving the musculoskeletal system: Secondary | ICD-10-CM | POA: Diagnosis not present

## 2022-11-12 DIAGNOSIS — R269 Unspecified abnormalities of gait and mobility: Secondary | ICD-10-CM | POA: Diagnosis not present

## 2022-11-12 DIAGNOSIS — I10 Essential (primary) hypertension: Secondary | ICD-10-CM | POA: Diagnosis not present

## 2022-11-12 DIAGNOSIS — E1142 Type 2 diabetes mellitus with diabetic polyneuropathy: Secondary | ICD-10-CM | POA: Diagnosis not present

## 2022-11-12 DIAGNOSIS — I2510 Atherosclerotic heart disease of native coronary artery without angina pectoris: Secondary | ICD-10-CM | POA: Diagnosis not present

## 2022-11-12 DIAGNOSIS — R1312 Dysphagia, oropharyngeal phase: Secondary | ICD-10-CM | POA: Diagnosis not present

## 2022-11-13 ENCOUNTER — Encounter (HOSPITAL_COMMUNITY): Payer: Self-pay

## 2022-11-13 ENCOUNTER — Emergency Department (HOSPITAL_COMMUNITY): Payer: Medicare HMO

## 2022-11-13 ENCOUNTER — Emergency Department (HOSPITAL_COMMUNITY)
Admission: EM | Admit: 2022-11-13 | Discharge: 2022-11-16 | Disposition: A | Discharge status: Home / Self Care | Payer: Medicare HMO | Attending: Emergency Medicine | Admitting: Emergency Medicine

## 2022-11-13 ENCOUNTER — Other Ambulatory Visit: Payer: Self-pay

## 2022-11-13 DIAGNOSIS — R262 Difficulty in walking, not elsewhere classified: Secondary | ICD-10-CM | POA: Diagnosis not present

## 2022-11-13 DIAGNOSIS — Z794 Long term (current) use of insulin: Secondary | ICD-10-CM | POA: Insufficient documentation

## 2022-11-13 DIAGNOSIS — I2510 Atherosclerotic heart disease of native coronary artery without angina pectoris: Secondary | ICD-10-CM | POA: Insufficient documentation

## 2022-11-13 DIAGNOSIS — K529 Noninfective gastroenteritis and colitis, unspecified: Secondary | ICD-10-CM | POA: Diagnosis not present

## 2022-11-13 DIAGNOSIS — R0789 Other chest pain: Secondary | ICD-10-CM | POA: Diagnosis not present

## 2022-11-13 DIAGNOSIS — Z7901 Long term (current) use of anticoagulants: Secondary | ICD-10-CM | POA: Insufficient documentation

## 2022-11-13 DIAGNOSIS — E119 Type 2 diabetes mellitus without complications: Secondary | ICD-10-CM | POA: Diagnosis not present

## 2022-11-13 DIAGNOSIS — Z79899 Other long term (current) drug therapy: Secondary | ICD-10-CM | POA: Diagnosis not present

## 2022-11-13 DIAGNOSIS — R2689 Other abnormalities of gait and mobility: Secondary | ICD-10-CM | POA: Insufficient documentation

## 2022-11-13 DIAGNOSIS — R079 Chest pain, unspecified: Secondary | ICD-10-CM | POA: Diagnosis not present

## 2022-11-13 DIAGNOSIS — E785 Hyperlipidemia, unspecified: Secondary | ICD-10-CM | POA: Diagnosis not present

## 2022-11-13 DIAGNOSIS — M6281 Muscle weakness (generalized): Secondary | ICD-10-CM | POA: Insufficient documentation

## 2022-11-13 DIAGNOSIS — I10 Essential (primary) hypertension: Secondary | ICD-10-CM | POA: Diagnosis not present

## 2022-11-13 DIAGNOSIS — R0602 Shortness of breath: Secondary | ICD-10-CM | POA: Diagnosis not present

## 2022-11-13 DIAGNOSIS — R112 Nausea with vomiting, unspecified: Secondary | ICD-10-CM | POA: Diagnosis not present

## 2022-11-13 DIAGNOSIS — R1111 Vomiting without nausea: Secondary | ICD-10-CM | POA: Diagnosis not present

## 2022-11-13 DIAGNOSIS — I25119 Atherosclerotic heart disease of native coronary artery with unspecified angina pectoris: Secondary | ICD-10-CM

## 2022-11-13 LAB — CBC WITH DIFFERENTIAL/PLATELET
Abs Immature Granulocytes: 0.03 10*3/uL (ref 0.00–0.07)
Basophils Absolute: 0.1 10*3/uL (ref 0.0–0.1)
Basophils Relative: 1 %
Eosinophils Absolute: 0.2 10*3/uL (ref 0.0–0.5)
Eosinophils Relative: 2 %
HCT: 41.1 % (ref 39.0–52.0)
Hemoglobin: 14.2 g/dL (ref 13.0–17.0)
Immature Granulocytes: 0 %
Lymphocytes Relative: 15 %
Lymphs Abs: 1.6 10*3/uL (ref 0.7–4.0)
MCH: 31.6 pg (ref 26.0–34.0)
MCHC: 34.5 g/dL (ref 30.0–36.0)
MCV: 91.5 fL (ref 80.0–100.0)
Monocytes Absolute: 0.9 10*3/uL (ref 0.1–1.0)
Monocytes Relative: 8 %
Neutro Abs: 8 10*3/uL — ABNORMAL HIGH (ref 1.7–7.7)
Neutrophils Relative %: 74 %
Platelets: 341 10*3/uL (ref 150–400)
RBC: 4.49 MIL/uL (ref 4.22–5.81)
RDW: 13.6 % (ref 11.5–15.5)
WBC: 10.8 10*3/uL — ABNORMAL HIGH (ref 4.0–10.5)
nRBC: 0 % (ref 0.0–0.2)

## 2022-11-13 LAB — COMPREHENSIVE METABOLIC PANEL
ALT: 23 U/L (ref 0–44)
AST: 22 U/L (ref 15–41)
Albumin: 3.2 g/dL — ABNORMAL LOW (ref 3.5–5.0)
Alkaline Phosphatase: 82 U/L (ref 38–126)
Anion gap: 10 (ref 5–15)
BUN: 15 mg/dL (ref 8–23)
CO2: 23 mmol/L (ref 22–32)
Calcium: 8.9 mg/dL (ref 8.9–10.3)
Chloride: 101 mmol/L (ref 98–111)
Creatinine, Ser: 0.75 mg/dL (ref 0.61–1.24)
GFR, Estimated: >60 mL/min (ref >60)
Glucose, Bld: 139 mg/dL — ABNORMAL HIGH (ref 70–99)
Potassium: 3.8 mmol/L (ref 3.5–5.1)
Sodium: 134 mmol/L — ABNORMAL LOW (ref 135–145)
Total Bilirubin: 0.5 mg/dL (ref 0.3–1.2)
Total Protein: 6.8 g/dL (ref 6.5–8.1)

## 2022-11-13 LAB — LIPASE, BLOOD: Lipase: 42 U/L (ref 11–51)

## 2022-11-13 LAB — CBG MONITORING, ED: Glucose-Capillary: 114 mg/dL — ABNORMAL HIGH (ref 70–99)

## 2022-11-13 LAB — TROPONIN I (HIGH SENSITIVITY): Troponin I (High Sensitivity): 6 ng/L (ref <18)

## 2022-11-13 MED ORDER — PANTOPRAZOLE SODIUM 40 MG PO TBEC
40.0000 mg | DELAYED_RELEASE_TABLET | Freq: Every day | ORAL | Status: DC
  Administered 2022-11-14 – 2022-11-16 (×3): 40 mg via ORAL
  Filled 2022-11-13 (×3): qty 1

## 2022-11-13 MED ORDER — CLOPIDOGREL BISULFATE 75 MG PO TABS
75.0000 mg | ORAL_TABLET | Freq: Every day | ORAL | Status: DC
  Administered 2022-11-14 – 2022-11-16 (×3): 75 mg via ORAL
  Filled 2022-11-13 (×3): qty 1

## 2022-11-13 MED ORDER — ONDANSETRON HCL 4 MG/2ML IJ SOLN
4.0000 mg | Freq: Once | INTRAMUSCULAR | Status: AC
  Administered 2022-11-13: 4 mg via INTRAVENOUS
  Filled 2022-11-13: qty 2

## 2022-11-13 MED ORDER — FAMOTIDINE IN NACL 20-0.9 MG/50ML-% IV SOLN
20.0000 mg | Freq: Once | INTRAVENOUS | Status: AC
  Administered 2022-11-13: 20 mg via INTRAVENOUS
  Filled 2022-11-13: qty 50

## 2022-11-13 MED ORDER — SUCRALFATE 1 G PO TABS
1.0000 g | ORAL_TABLET | Freq: Three times a day (TID) | ORAL | Status: DC
  Administered 2022-11-13 – 2022-11-16 (×11): 1 g via ORAL
  Filled 2022-11-13 (×11): qty 1

## 2022-11-13 MED ORDER — ISOSORBIDE MONONITRATE ER 30 MG PO TB24
15.0000 mg | ORAL_TABLET | Freq: Every day | ORAL | Status: DC
  Administered 2022-11-13 – 2022-11-16 (×4): 15 mg via ORAL
  Filled 2022-11-13 (×4): qty 1

## 2022-11-13 MED ORDER — ALUM & MAG HYDROXIDE-SIMETH 200-200-20 MG/5ML PO SUSP
30.0000 mL | Freq: Once | ORAL | Status: AC
  Administered 2022-11-13: 30 mL via ORAL
  Filled 2022-11-13: qty 30

## 2022-11-13 MED ORDER — CARBIDOPA-LEVODOPA 25-100 MG PO TABS
1.0000 | ORAL_TABLET | Freq: Three times a day (TID) | ORAL | Status: DC
  Administered 2022-11-13 – 2022-11-16 (×8): 1 via ORAL
  Filled 2022-11-13 (×8): qty 1

## 2022-11-13 MED ORDER — ONDANSETRON HCL 4 MG PO TABS: 4.0000 mg | ORAL_TABLET | Freq: Four times a day (QID) | ORAL | 0 refills | Status: AC

## 2022-11-13 MED ORDER — FAMOTIDINE 20 MG PO TABS: 20.0000 mg | ORAL_TABLET | Freq: Every day | ORAL | 0 refills | Status: DC

## 2022-11-13 MED ORDER — METOPROLOL SUCCINATE ER 25 MG PO TB24
25.0000 mg | ORAL_TABLET | Freq: Every day | ORAL | Status: DC
  Administered 2022-11-13 – 2022-11-16 (×4): 25 mg via ORAL
  Filled 2022-11-13 (×4): qty 1

## 2022-11-13 MED ORDER — INSULIN ASPART 100 UNIT/ML IJ SOLN
0.0000 [IU] | Freq: Three times a day (TID) | INTRAMUSCULAR | Status: DC
  Administered 2022-11-14 (×2): 1 [IU] via SUBCUTANEOUS
  Administered 2022-11-15 – 2022-11-16 (×2): 2 [IU] via SUBCUTANEOUS
  Administered 2022-11-16: 1 [IU] via SUBCUTANEOUS

## 2022-11-13 MED ORDER — LACTATED RINGERS IV BOLUS
1000.0000 mL | Freq: Once | INTRAVENOUS | Status: AC
  Administered 2022-11-13: 1000 mL via INTRAVENOUS

## 2022-11-13 MED ORDER — APIXABAN 5 MG PO TABS
5.0000 mg | ORAL_TABLET | Freq: Two times a day (BID) | ORAL | Status: DC
  Administered 2022-11-13 – 2022-11-16 (×6): 5 mg via ORAL
  Filled 2022-11-13 (×6): qty 1

## 2022-11-13 MED ORDER — INSULIN ASPART 100 UNIT/ML IJ SOLN
0.0000 [IU] | Freq: Every day | INTRAMUSCULAR | Status: DC
  Administered 2022-11-15: 1 [IU] via SUBCUTANEOUS

## 2022-11-13 MED ORDER — HYDROXYCHLOROQUINE SULFATE 200 MG PO TABS
200.0000 mg | ORAL_TABLET | Freq: Every day | ORAL | Status: DC
  Administered 2022-11-13 – 2022-11-16 (×4): 200 mg via ORAL
  Filled 2022-11-13 (×5): qty 1

## 2022-11-13 MED ORDER — AMLODIPINE BESYLATE 5 MG PO TABS
5.0000 mg | ORAL_TABLET | Freq: Every day | ORAL | Status: DC
  Administered 2022-11-13 – 2022-11-16 (×3): 5 mg via ORAL
  Filled 2022-11-13 (×4): qty 1

## 2022-11-13 NOTE — Progress Notes (Signed)
Transition of Care [facility]) - Emergency Department Mini Assessment   Patient Details  Name: William Hunter MRN: [id number] Date of Birth: 1945-08-09  Transition of Care [facility]) CM/SW Contact:    Lossie Faes Tarpley-Carter, LCSWA Phone Number: 11/13/2022, 9:05 PM   Clinical Narrative: [facility] CSW consulted with pts granddaughter, William Hunter.  [name] will be the contact person for pts care.  [name] would like for pt to go to SNF.  Pt has been placed at a SNF in the past, they are familiar.  CSW explained Choice facilities to [place].  [name] is very knowledgeable about pts care because she is a Estate agent in [place].  Pt currently has HH RN and PT with [facility].  CSW will work pt up for SNF placement.  Kareem Aul Tarpley-Carter, MSW, LCSW-A Pronouns:  She/Her/Hers [facility]itions of Care Clinical Social Worker Direct Number:  [phone] [email or web address]    ED Mini Assessment: What brought you to the Emergency Department? : Chest Pain, Nausea, Vomiting, and Diarrhea  Barriers to Discharge: No Barriers Identified     Means of departure: Not know  Interventions which prevented an admission or readmission: SNF Placement    Patient Contact and Communications Key Contact 1: William Hunter   Spoke with: Allene Dillon Date: 11/13/22,   Contact time: 0830 Contact Phone Number: [phone] Call outcome: William Hunter is the contact person for pt.  Chistian agrees pt needs SNF placement, pt is total care.  Patient states their goals for this hospitalization and ongoing recovery are:: NA CMS Medicare.gov Compare Post Acute Care list provided to:: Patient Represenative (must comment) (Adult Granddaughter) Choice offered to / list presented to : Adult Children  Admission diagnosis:  cp Patient Active Problem List   Diagnosis Date Noted   Elevated troponin 09/30/2022   Chest pain 09/29/2022   High risk medication use  09/26/2022   Neuropathy 04/10/2022   Abnormal finding on MRI of brain 04/10/2022   Gait abnormality 01/31/2022   Aortic atherosclerosis ([name] 12/09/2021   History of pulmonary embolism 12/09/2021   Pure hypercholesterolemia 12/09/2021   Bilateral leg weakness 11/25/2021   Leg weakness, bilateral 11/25/2021   Lower extremity weakness 11/25/2021   Statin myopathy 02/22/2020   Acute respiratory failure with hypoxia ([name] 07/03/2019   Acute respiratory failure due to COVID-19 ([name] 07/03/2019   Migraine with aura and without status migrainosus, not intractable 09/02/2016   Diabetic polyneuropathy associated with type 2 diabetes mellitus ([name] 09/02/2016   Rheumatoid arthritis ([name] 09/02/2016   Coronary vasospasm ([name] 04/06/2014   Gallstones 03/10/2012   Abdominal pain 03/09/2012   NSTEMI (non-ST elevation myocardial infarction) ([name]    Coronary artery disease    Hypertension    Dyslipidemia    PCP:  Irena Reichmann, DO Pharmacy:   [id number] [place], [place] - [address] [place] [address] Phone: [phone] Fax: [phone]  [facility] [employer] - [place], [place] - [address] [place] [address] Phone: [phone] Fax: [phone]

## 2022-11-13 NOTE — Discharge Instructions (Signed)
You were seen in the emergency department for your chest pain, nausea and vomiting.  Your workup showed no signs of heart attack or stress on your heart and no signs of severe dehydration.  You may have a viral infection or inflammation of your stomach causing your symptoms and I have given you an antacid medication that you should take daily for at least the next 2 weeks and then can take as needed.  Have also given you nausea medicine to take as needed.  You should follow-up with your primary doctor in the next few days to have your symptoms rechecked.  You should return to the emergency department if you are having continued vomiting despite the nausea medicine, significantly worsening pain, severe shortness of breath or if you have any other new or concerning symptoms.

## 2022-11-13 NOTE — ED Triage Notes (Signed)
Pt arrived to ED via ems w cc of CP/N/V/D x1day. Pt states he had stents placed x68month ago. Unable to keep anything down due to N/V

## 2022-11-13 NOTE — NC FL2 (Signed)
[place] MEDICAID FL2 LEVEL OF CARE FORM     IDENTIFICATION  Patient Name: William Hunter Birthdate: [birth year] Sex: male Admission Date (Current Location): 11/13/2022  [facility] and [place] Number:  Producer, television/film/video and Address:  The . [facility], [address], [place], [place] [address]      Provider Number: [id number]  Attending Physician Name and Address:  Default, Provider, MD  Relative Name and Phone Number:  Thelma Comp    Current Level of Care: Hospital Recommended Level of Care: Skilled Nursing Facility Prior Approval Number:    Date Approved/Denied:   PASRR Number: [id number]  Discharge Plan: SNF    Current Diagnoses: Patient Active Problem List   Diagnosis Date Noted   Elevated troponin 09/30/2022   Chest pain 09/29/2022   High risk medication use 09/26/2022   Neuropathy 04/10/2022   Abnormal finding on MRI of brain 04/10/2022   Gait abnormality 01/31/2022   Aortic atherosclerosis ([name] 12/09/2021   History of pulmonary embolism 12/09/2021   Pure hypercholesterolemia 12/09/2021   Bilateral leg weakness 11/25/2021   Leg weakness, bilateral 11/25/2021   Lower extremity weakness 11/25/2021   Statin myopathy 02/22/2020   Acute respiratory failure with hypoxia ([name] 07/03/2019   Acute respiratory failure due to COVID-19 ([name] 07/03/2019   Migraine with aura and without status migrainosus, not intractable 09/02/2016   Diabetic polyneuropathy associated with type 2 diabetes mellitus ([name] 09/02/2016   Rheumatoid arthritis ([name] 09/02/2016   Coronary vasospasm ([name] 04/06/2014   Gallstones 03/10/2012   Abdominal pain 03/09/2012   NSTEMI (non-ST elevation myocardial infarction) ([name]    Coronary artery disease    Hypertension    Dyslipidemia     Orientation RESPIRATION BLADDER Height & Weight     Self, Time, Situation, Place  Normal   Weight: 160 lb (72.6 kg) Height:  5\' 10"  (177.8 cm)  BEHAVIORAL SYMPTOMS/MOOD  NEUROLOGICAL BOWEL NUTRITION STATUS        Diet (Heart Healthy)  AMBULATORY STATUS COMMUNICATION OF NEEDS Skin   Extensive Assist Verbally Normal                       Personal Care Assistance Level of Assistance  Bathing, Feeding, Dressing Bathing Assistance: Maximum assistance Feeding assistance: Independent Dressing Assistance: Limited assistance     Functional Limitations Info  Sight, Hearing, Speech Sight Info: Adequate Hearing Info: Adequate Speech Info: Adequate    SPECIAL CARE FACTORS FREQUENCY  PT (By licensed PT), OT (By licensed OT)     PT Frequency: 5 x a week OT Frequency: 5 x a week            Contractures Contractures Info: Not present    Additional Factors Info  Code Status, Allergies, Insulin Sliding Scale Code Status Info: Full Code Allergies Info: Penicillins, Prednisone, Wool Alcohol (lanolin), Empagliflozin, Penicillin G, and Statins   Insulin Sliding Scale Info: Novolog 0-5 units, Daily at bedtime.  Novolog 0-9 units, 3 x daily with meals.       Current Medications (11/13/2022):  This is the current hospital active medication list Current Facility-Administered Medications  Medication Dose Route Frequency Provider Last Rate Last Admin   amLODipine (NORVASC) tablet 5 mg  5 mg Oral Daily Gloris Manchester, MD   5 mg at 11/13/22 1942   apixaban (ELIQUIS) tablet 5 mg  5 mg Oral BID Gloris Manchester, MD       carbidopa-levodopa (SINEMET IR) 25-100 MG per tablet immediate release 1 tablet  1 tablet Oral TID Gloris Manchester, MD       [START ON 11/14/2022] clopidogrel (PLAVIX) tablet 75 mg  75 mg Oral Q breakfast Gloris Manchester, MD       hydroxychloroquine (PLAQUENIL) tablet 200 mg  200 mg Oral Daily Gloris Manchester, MD       insulin aspart (novoLOG) injection 0-5 Units  0-5 Units Subcutaneous QHS Gloris Manchester, MD       [START ON 11/14/2022] insulin aspart (novoLOG) injection 0-9 Units  0-9 Units Subcutaneous TID WC Gloris Manchester, MD       isosorbide mononitrate (IMDUR) 24 hr  tablet 15 mg  15 mg Oral Daily Gloris Manchester, MD   15 mg at 11/13/22 1942   metoprolol succinate (TOPROL-XL) 24 hr tablet 25 mg  25 mg Oral Daily Gloris Manchester, MD   25 mg at 11/13/22 1943   [START ON 11/14/2022] pantoprazole (PROTONIX) EC tablet 40 mg  40 mg Oral Q0600 Gloris Manchester, MD       Study - ORION 4 - inclisiran 300 mg/1.72mL or placebo SQ injection (PI-Stuckey)  300 mg Subcutaneous Q6 months Herby Abraham, MD       sucralfate (CARAFATE) tablet 1 g  1 g Oral TID WC & HS Gloris Manchester, MD       Current Outpatient Medications  Medication Sig Dispense Refill   famotidine (PEPCID) 20 MG tablet Take 1 tablet (20 mg total) by mouth daily. 30 tablet 0   ondansetron (ZOFRAN) 4 MG tablet Take 1 tablet (4 mg total) by mouth every 6 (six) hours. 12 tablet 0   amLODipine (NORVASC) 5 MG tablet Take 1 tablet (5 mg total) by mouth daily.     apixaban (ELIQUIS) 5 MG TABS tablet Take 1 tablet (5 mg total) by mouth 2 (two) times daily. 60 tablet    BD PEN NEEDLE MICRO U/F 32G X 6 MM MISC Inject into the skin 3 (three) times daily.     BD PEN NEEDLE NANO 2ND GEN 32G X 4 MM MISC      carbidopa-levodopa (SINEMET IR) 25-100 MG tablet Take 1 tablet by mouth 3 (three) times daily. 90 tablet 6   clopidogrel (PLAVIX) 75 MG tablet Take 1 tablet (75 mg total) by mouth daily with breakfast.     Continuous Glucose Sensor (FREESTYLE LIBRE 2 SENSOR) MISC Place onto the skin.     folic acid (FOLVITE) 1 MG tablet Take 1 tablet (1 mg total) by mouth daily. 90 tablet 3   GVOKE HYPOPEN 2-PACK 1 MG/0.2ML SOAJ Inject into the skin.     HUMIRA, 2 PEN, 40 MG/0.4ML PNKT Inject 0.4 mLs into the muscle every 14 (fourteen) days. 2 each 2   hydroxychloroquine (PLAQUENIL) 200 MG tablet Take 1 tablet (200 mg total) by mouth daily. 90 tablet 0   insulin aspart (NOVOLOG) 100 UNIT/ML injection 0-5 Units, Subcutaneous, Daily at bedtime: HS scale CBG < 70: implement hypoglycemia measures CBG 70 - 120: 0 units CBG 121 - 150: 0 units CBG 151 -  200: 0 units CBG 201 - 250: 2 units CBG 251 - 300: 3 units CBG 301 - 350: 4 units CBG 351 - 400: 5 units CBG > 400: call MD and obtain STAT lab verification 10 mL 11   insulin aspart (NOVOLOG) 100 UNIT/ML injection 0-15 Units, Subcutaneous, 3 times daily with meals CBG < 70: Implement Hypoglycemia Standing Orders and refer to Hypoglycemia Standing Orders sidebar report CBG 70 - 120: 0 units CBG 121 -  150: 2 units CBG 151 - 200: 3 units CBG 201 - 250: 5 units CBG 251 - 300: 8 units CBG 301 - 350: 11 units CBG 351 - 400: 15 units CBG > 400: call MD and obtain STAT lab verification 10 mL 11   isosorbide mononitrate (IMDUR) 30 MG 24 hr tablet Take 0.5 tablets (15 mg total) by mouth daily.     meclizine (ANTIVERT) 25 MG tablet Take 25 mg by mouth 3 (three) times daily as needed for dizziness.     methotrexate (RHEUMATREX) 2.5 MG tablet Take 6 tablets (15 mg total) by mouth once a week. Caution:Chemotherapy. Protect from light. 72 tablet 0   metoprolol succinate (TOPROL-XL) 25 MG 24 hr tablet Take 1 tablet (25 mg total) by mouth daily.     MYRBETRIQ 25 MG TB24 tablet Take 25 mg by mouth daily.     nitroGLYCERIN (NITROSTAT) 0.4 MG SL tablet Place 1 tablet (0.4 mg total) under the tongue every 5 (five) minutes x 3 doses as needed for chest pain.  12   pantoprazole (PROTONIX) 40 MG tablet Take 1 tablet (40 mg total) by mouth daily at 6 (six) AM.     Study - ORION 4 - inclisiran 300 mg/1.36mL or placebo SQ injection (PI-Stuckey) Inject 1.5 mLs (300 mg total) into the skin every 6 (six) months. (Patient not taking: Reported on 09/26/2022) 1 mL 1   sucralfate (CARAFATE) 1 g tablet Take 1 tablet (1 g total) by mouth 4 (four) times daily -  with meals and at bedtime. 30 tablet 0     Discharge Medications: Please see discharge summary for a list of discharge medications.  Relevant Imaging Results:  Relevant Lab Results:   Additional Information SSN#:  [id number]        Ht:  5\' 10"           Wt:  160 lbs           BMI:  22.96 kg/m2  Ahlivia Salahuddin C Tarpley-Carter, LCSWA

## 2022-11-13 NOTE — ED Provider Notes (Signed)
Care of patient assumed from Dr. Theresia Lo.  This patient presented with chest pain, nausea, vomiting since yesterday.  Workup has been reassuring.  He is currently awaiting p.o. challenge.  Discharge is anticipated. Physical Exam  BP 127/87 (BP Location: Right Arm)   Pulse 99   Temp 98.1 F (36.7 C) (Oral)   Resp 16   Ht 5\' 10"  (1.778 m)   Wt 72.6 kg   SpO2 99%   BMI 22.96 kg/m   Physical Exam Vitals and nursing note reviewed.  Constitutional:      General: He is not in acute distress.    Appearance: He is well-developed. He is not ill-appearing, toxic-appearing or diaphoretic.  HENT:     Head: Normocephalic and atraumatic.  Eyes:     Conjunctiva/sclera: Conjunctivae normal.  Cardiovascular:     Rate and Rhythm: Normal rate and regular rhythm.     Heart sounds: No murmur heard. Pulmonary:     Effort: Pulmonary effort is normal. No tachypnea or respiratory distress.  Abdominal:     Palpations: Abdomen is soft.     Tenderness: There is no abdominal tenderness.  Musculoskeletal:        General: No swelling. Normal range of motion.     Cervical back: Normal range of motion and neck supple.     Right lower leg: No edema.     Left lower leg: No edema.  Skin:    General: Skin is warm and dry.     Coloration: Skin is not cyanotic or pale.  Neurological:     General: No focal deficit present.     Mental Status: He is alert and oriented to person, place, and time.  Psychiatric:        Mood and Affect: Mood normal.        Behavior: Behavior normal.     Procedures  Procedures  ED Course / MDM   Clinical Course as of 11/13/22 1623  Wed Nov 13, 2022  1608 Labs within normal range, single troponin sufficient as symptoms ongoing for 24 hrs.  [VK]  1615 Patient reports improvement of his symptoms with only mild discomfort. Will be given maalox/PO challenge and likely will be discharge if no further vomiting. [VK]    Clinical Course User Index [VK] Rexford Maus, DO    Medical Decision Making Amount and/or Complexity of Data Reviewed Labs: ordered. Radiology: ordered.  Risk OTC drugs. Prescription drug management.   On assessment, patient states that no one had given him food or water.  He was given a cup of water to drink which he was able to tolerate.  He did endorse ongoing chest pain.  Cardiology was consulted.  Cardiology team came and evaluated the patient in the ED and do not feel that patient's symptoms are cardiac in nature.  When speaking further with the patient, he reports that he has not walked in the past 6 months.  He attributes this to generalized weakness.  Typically, he lives with his wife.  Currently, his wife is admitted to the hospital, reportedly in the ICU.  He states that he essentially sits in a recliner throughout the day.  His daughter, who does not live with him, is able to stop by periodically to help him out.  Patient is agreeable to physical therapy evaluation and social work consult.  Orders were placed.       Gloris Manchester, MD 11/13/22 [phone]

## 2022-11-13 NOTE — ED Provider Notes (Signed)
[facility] AT [facility] Provider Note   CSN: [id number] Arrival date & time: 11/13/22  1443     History  Chief Complaint  Patient presents with   Chest Pain    William Hunter is a 77 y.o. male.  Patient is a 77 year old male with a past medical history of CAD status post stent placement about 1 month ago on Eliquis, hypertension, diabetes resenting to the emergency department with chest pain, nausea and vomiting.  Patient states that yesterday afternoon he was at rest in his chair when he started to develop midsternal chest pain.  He states that it feels like a throbbing type of pain.  He states he has associated nausea, vomiting and shortness of breath.  He denies any lightheadedness or dizziness.  He denies any fever or cough.  He denies any lower extremity swelling.  He denies any associated abdominal pain but states that he has had diarrhea.  He denies any black or bloody stools.  The history is provided by the patient.  Chest Pain      Home Medications Prior to Admission medications   Medication Sig Start Date End Date Taking? Authorizing Provider  famotidine (PEPCID) 20 MG tablet Take 1 tablet (20 mg total) by mouth daily. 11/13/22  Yes Theresia Lo, [place] K, DO  ondansetron (ZOFRAN) 4 MG tablet Take 1 tablet (4 mg total) by mouth every 6 (six) hours. 11/13/22  Yes Theresia Lo, [place] K, DO  amLODipine (NORVASC) 5 MG tablet Take 1 tablet (5 mg total) by mouth daily. 10/04/22   Lanae Boast, MD  apixaban (ELIQUIS) 5 MG TABS tablet Take 1 tablet (5 mg total) by mouth 2 (two) times daily. 10/03/22   Lanae Boast, MD  BD PEN NEEDLE MICRO U/F 32G X 6 MM MISC Inject into the skin 3 (three) times daily. 09/16/22   [provider]  BD PEN NEEDLE NANO 2ND GEN 32G X 4 MM MISC  06/07/22   [provider]  carbidopa-levodopa (SINEMET IR) 25-100 MG tablet Take 1 tablet by mouth 3 (three) times daily. 04/10/22   Levert Feinstein, MD  clopidogrel (PLAVIX) 75 MG  tablet Take 1 tablet (75 mg total) by mouth daily with breakfast. 10/04/22   Lanae Boast, MD  Continuous Glucose Sensor (FREESTYLE LIBRE 2 SENSOR) MISC Place onto the skin. 09/18/22   [provider]  folic acid (FOLVITE) 1 MG tablet Take 1 tablet (1 mg total) by mouth daily. 09/26/22   Rice, Jamesetta Orleans, MD  GVOKE HYPOPEN 2-PACK 1 MG/0.2ML SOAJ Inject into the skin. 04/11/22   [provider]  HUMIRA, 2 PEN, 40 MG/0.4ML PNKT Inject 0.4 mLs into the muscle every 14 (fourteen) days. 09/30/22   Fuller Plan, MD  hydroxychloroquine (PLAQUENIL) 200 MG tablet Take 1 tablet (200 mg total) by mouth daily. 09/26/22   Rice, Jamesetta Orleans, MD  insulin aspart (NOVOLOG) 100 UNIT/ML injection 0-5 Units, Subcutaneous, Daily at bedtime: HS scale CBG < 70: implement hypoglycemia measures CBG 70 - 120: 0 units CBG 121 - 150: 0 units CBG 151 - 200: 0 units CBG 201 - 250: 2 units CBG 251 - 300: 3 units CBG 301 - 350: 4 units CBG 351 - 400: 5 units CBG > 400: call MD and obtain STAT lab verification 11/29/21   Maretta Bees, MD  insulin aspart (NOVOLOG) 100 UNIT/ML injection 0-15 Units, Subcutaneous, 3 times daily with meals CBG < 70: Implement Hypoglycemia Standing Orders and refer to Hypoglycemia Standing  Orders sidebar report CBG 70 - 120: 0 units CBG 121 - 150: 2 units CBG 151 - 200: 3 units CBG 201 - 250: 5 units CBG 251 - 300: 8 units CBG 301 - 350: 11 units CBG 351 - 400: 15 units CBG > 400: call MD and obtain STAT lab verification 11/29/21   Maretta Bees, MD  isosorbide mononitrate (IMDUR) 30 MG 24 hr tablet Take 0.5 tablets (15 mg total) by mouth daily. 10/04/22   Lanae Boast, MD  meclizine (ANTIVERT) 25 MG tablet Take 25 mg by mouth 3 (three) times daily as needed for dizziness.    [provider]  methotrexate (RHEUMATREX) 2.5 MG tablet Take 6 tablets (15 mg total) by mouth once a week. Caution:Chemotherapy. Protect from light. 09/26/22   Rice, Jamesetta Orleans, MD  metoprolol  succinate (TOPROL-XL) 25 MG 24 hr tablet Take 1 tablet (25 mg total) by mouth daily. 10/03/22   Lanae Boast, MD  MYRBETRIQ 25 MG TB24 tablet Take 25 mg by mouth daily. 09/12/22   [provider]  nitroGLYCERIN (NITROSTAT) 0.4 MG SL tablet Place 1 tablet (0.4 mg total) under the tongue every 5 (five) minutes x 3 doses as needed for chest pain. 10/03/22   Lanae Boast, MD  pantoprazole (PROTONIX) 40 MG tablet Take 1 tablet (40 mg total) by mouth daily at 6 (six) AM. 10/03/22   Lanae Boast, MD  Study - ORION 4 - inclisiran 300 mg/1.84mL or placebo SQ injection (PI-Stuckey) Inject 1.5 mLs (300 mg total) into the skin every 6 (six) months. Patient not taking: Reported on 09/26/2022 09/06/21   Herby Abraham, MD  sucralfate (CARAFATE) 1 g tablet Take 1 tablet (1 g total) by mouth 4 (four) times daily -  with meals and at bedtime. 11/08/22   Fayrene Helper, PA-C      Allergies    Codeine, Penicillins, Prednisone, Wool alcohol [lanolin], Empagliflozin, Penicillin g, and Statins    Review of Systems   Review of Systems  Cardiovascular:  Positive for chest pain.    Physical Exam Updated Vital Signs BP 127/87 (BP Location: Right Arm)   Pulse 99   Temp 98.1 F (36.7 C) (Oral)   Resp 16   Ht 5\' 10"  (1.778 m)   Wt 72.6 kg   SpO2 99%   BMI 22.96 kg/m  Physical Exam Vitals and nursing note reviewed.  Constitutional:      General: He is not in acute distress.    Appearance: He is well-developed.  HENT:     Head: Normocephalic and atraumatic.  Eyes:     Extraocular Movements: Extraocular movements intact.  Cardiovascular:     Rate and Rhythm: Normal rate and regular rhythm.     Pulses:          Radial pulses are 2+ on the right side and 2+ on the left side.     Heart sounds: Normal heart sounds.  Pulmonary:     Effort: Pulmonary effort is normal.     Breath sounds: Normal breath sounds.  Chest:     Chest wall: No tenderness.  Abdominal:     Palpations: Abdomen is soft.     Tenderness:  There is no abdominal tenderness.  Musculoskeletal:        General: Normal range of motion.     Cervical back: Normal range of motion and neck supple.     Right lower leg: No edema.     Left lower leg: No edema.  Skin:  General: Skin is warm and dry.  Neurological:     General: No focal deficit present.     Mental Status: He is alert and oriented to person, place, and time.  Psychiatric:        Mood and Affect: Mood normal.        Behavior: Behavior normal.     ED Results / Procedures / Treatments   Labs (all labs ordered are listed, but only abnormal results are displayed) Labs Reviewed  COMPREHENSIVE METABOLIC PANEL - Abnormal; Notable for the following components:      Result Value   Sodium 134 (*)    Glucose, Bld 139 (*)    Albumin 3.2 (*)    All other components within normal limits  CBC WITH DIFFERENTIAL/PLATELET - Abnormal; Notable for the following components:   WBC 10.8 (*)    Neutro Abs 8.0 (*)    All other components within normal limits  LIPASE, BLOOD  TROPONIN I (HIGH SENSITIVITY)    EKG EKG Interpretation  Date/Time:  Wednesday November 13 2022 14:45:25 EDT Ventricular Rate:  100 PR Interval:  188 QRS Duration: 88 QT Interval:  353 QTC Calculation: 456 R Axis:   45 Text Interpretation: Sinus tachycardia Low voltage, precordial leads No significant change since last tracing Confirmed by Elayne Snare ([id number]) on 11/13/2022 3:03:22 PM  Radiology DG Chest 2 View  Result Date: 11/13/2022 CLINICAL DATA:  Chest pain, shortness of breath. EXAM: CHEST - 2 VIEW COMPARISON:  Nov 08, 2022. FINDINGS: The heart size and mediastinal contours are within normal limits. Both lungs are clear. Status post midthoracic kyphoplasty. IMPRESSION: No active cardiopulmonary disease. Electronically Signed   By: Lupita Raider M.D.   On: 11/13/2022 16:00    Procedures Procedures    Medications Ordered in ED Medications  alum & mag hydroxide-simeth (MAALOX/MYLANTA) 200-200-20  MG/5ML suspension 30 mL (has no administration in time range)  lactated ringers bolus 1,000 mL (1,000 mLs Intravenous New Bag/Given 11/13/22 1510)  ondansetron (ZOFRAN) injection 4 mg (4 mg Intravenous Given 11/13/22 1518)  famotidine (PEPCID) IVPB 20 mg premix (0 mg Intravenous Stopped 11/13/22 1616)    ED Course/ Medical Decision Making/ A&P Clinical Course as of 11/13/22 1624  Wed Nov 13, 2022  1608 Labs within normal range, single troponin sufficient as symptoms ongoing for 24 hrs.  [VK]  1615 Patient reports improvement of his symptoms with only mild discomfort. Will be given maalox/PO challenge and likely will be discharge if no further vomiting. [VK]    Clinical Course User Index [VK] Rexford Maus, DO                             Medical Decision Making This patient presents to the ED with chief complaint(s) of chest pain, N/V with pertinent past medical history of CAD s/p recent stent placed, DM, HTN which further complicates the presenting complaint. The complaint involves an extensive differential diagnosis and also carries with it a high risk of complications and morbidity.    The differential diagnosis includes ACS, arrhythmia, anemia, pneumonia, pneumothorax, pulmonary edema, pleural effusion, gastritis, gastroenteritis, GERD, hyperglycemic crisis, pancreatitis, hepatitis  Additional history obtained: Additional history obtained from N/A Records reviewed previous admission documents  ED Course and Reassessment: On patient's arrival to the emergency department he is hemodynamically stable in no acute distress.  EKG performed on arrival showed normal sinus rhythm without acute ischemic changes.  Patient will have labs and chest  x-ray performed to further evaluate for cause of his symptoms.  He will be given GI cocktail, fluids and nausea medicine management will be closely reassessed.  Independent labs interpretation:  The following labs were independently interpreted:  within normal range   Independent visualization of imaging: - I independently visualized the following imaging with scope of interpretation limited to determining acute life threatening conditions related to emergency care: CXR, which revealed no acute disease  Consultation: - Consulted or discussed management/test interpretation w/ external professional: N/A     Amount and/or Complexity of Data Reviewed Labs: ordered. Radiology: ordered.  Risk OTC drugs. Prescription drug management.           Final Clinical Impression(s) / ED Diagnoses Final diagnoses:  Atypical chest pain  Gastroenteritis    Rx / DC Orders ED Discharge Orders          Ordered    ondansetron (ZOFRAN) 4 MG tablet  Every 6 hours        11/13/22 1612    famotidine (PEPCID) 20 MG tablet  Daily        11/13/22 1612              Rexford Maus, DO 11/13/22 1624

## 2022-11-13 NOTE — ED Notes (Signed)
Pt in bed, pt continues to report chest pain, 5/10, food and drink given.

## 2022-11-13 NOTE — Consult Note (Addendum)
Cardiology Consultation   Patient ID: William Hunter MRN: [id number]; DOB: February 13, 1946  Admit date: 11/13/2022 Date of Consult: 11/13/2022  PCP:  Irena Reichmann, DO   [facility] Providers Cardiologist:  Donato Schultz, MD     Patient Profile:   William Hunter is a 77 y.o. male with a hx of CAD, recurrent DVT/PE on Eliquis, hypertension combined with anemia, diabetes who is being seen 11/13/2022 for the evaluation of chest pain at the request of  Dr. Durwin Nora.  History of Present Illness:   William Hunter is a 77 year old male with past medical history noted above.  He had an MI in 1981 with no CAD on cath which suggested coronary vasospasm.  He previously resided in a nursing facility.  He was admitted April 2024 with chest pain with serial enzymes elevated at 534.  Echocardiogram at that time showed LVEF of 60 to 65%, no regional wall motion abnormality, grade 1 diastolic dysfunction.  Underwent cardiac catheterization which showed 80% followed by 75% ramus lesion, 75% mid LAD lesion, 50% ostial/proximal LAD lesion, 60% ostial/proximal left circumflex lesion 50% ostial/proximal RCA lesion and 75% RPDA lesion with severe right subclavian tortuosity.  He was evaluated by Dr. Dorris Fetch who felt the patient was weak at baseline and unable to support himself without significant assistance.  He was limited in his walking distance even with a walker.  It was felt these factors would impede his ability to recover from CABG.  He returned back to the Cath Lab 2 days later and underwent staged PCI of mid LAD with DES x 1.  Ramus lesions were to be treated medically.  Post procedure was placed on aspirin, Plavix and Eliquis.  Aspirin was eventually discontinued prior to discharge.  He was seen back in the ED on 5/31 with complaints of recurrent chest pain.  He was evaluated by Dr. Herbie Baltimore and having negative serial enzymes was felt his chest pain was noncardiac.  He was given a GI cocktail as he had previously  been diagnosed with possible esophagitis based on distal esophageal thickening on a CT recently.  He was continued on Plavix as monotherapy given the need for Eliquis.  He currently lives at home, his wife was recently in a MVC and is currently in the ICU.  Reports he continues to be extremely weak unable to get up out of his recliner.  Previous notes indicate he was using adult diapers.  Does report being compliant with his home medications.  Noted back to the ED on 6/5 with complaints of chest tightness, palpitations, nausea and ongoing weakness.  In the ED his labs showed sodium 134, potassium 3.8, creatinine 0.75, high-sensitivity troponin 6, WBC 10.8, hemoglobin 14.2.  Chest x-ray negative.  EKG sinus tachycardia, 100 bpm, no acute ST/T wave abnormalities.  Cardiology was asked to evaluate.   Past Medical History:  Diagnosis Date   Anginal pain ([name]    Arthritis    "fingers" (03/09/2012)   Cold feet    "from my diabetes; they stay cold" (03/09/2012)   Coronary artery disease    Coronary artery spasm [facility])    since age 13 Dr Aleen Campi   COVID 07/06/2019   hospitalizied   Dyslipidemia    Femoral DVT (deep venous thrombosis) ([name]    left   Headache(784.0)    Hx of gallstones    Dr Derrell Lolling lap cholecystectomy   Hx of plastic surgery    plastic surgery following a fall off a loading dock ,lost  all his upper teeth    Hypertension    MI, old    at age 56   Migraines    Pneumonia ~ 1962; 1970's   "double"; "regular" (03/09/2012)   Rheumatoid arthritis ([name]    Dr Dierdre Forth    Slipped cervical disc 06/10/2006   Dr Patsi Sears in the past/ Dr Larita Fife , chiropractor in 2008   Thyroid disease    Dr Lucianne Muss   Type II diabetes mellitus [facility])     Past Surgical History:  Procedure Laterality Date   CARDIAC CATHETERIZATION     x 3 with NO stents    CHOLECYSTECTOMY  03/11/2012   Procedure: LAPAROSCOPIC CHOLECYSTECTOMY WITH INTRAOPERATIVE CHOLANGIOGRAM;  Surgeon: Ernestene Mention, MD;   Location: [facility] OR;  Service: General;  Laterality: N/A;   CORONARY STENT INTERVENTION N/A 10/02/2022   Procedure: CORONARY STENT INTERVENTION;  Surgeon: Corky Crafts, MD;  Location: [facility] INVASIVE CV LAB;  Service: Cardiovascular;  Laterality: N/A;   CORONARY ULTRASOUND/IVUS N/A 10/02/2022   Procedure: Coronary Ultrasound/IVUS;  Surgeon: Corky Crafts, MD;  Location[facility] INVASIVE CV LAB;  Service: Cardiovascular;  Laterality: N/A;   KYPHOPLASTY N/A 05/30/2021   Procedure: THORACIC FIVE KYPHOPLASTY;  Surgeon: Coletta Memos, MD;  Location: [facility] OR;  Service: Neurosurgery;  Laterality: N/A;  THORACIC FIVE KYPHOPLASTY   LEFT HEART CATH AND CORONARY ANGIOGRAPHY N/A 09/30/2022   Procedure: LEFT HEART CATH AND CORONARY ANGIOGRAPHY;  Surgeon: Corky Crafts, MD;  Location: [facility] INVASIVE CV LAB;  Service: Cardiovascular;  Laterality: N/A;   PENILE PROSTHESIS IMPLANT  1983       Inpatient Medications: Scheduled Meds:  ORION 4 inclisiran or placebo  300 mg Subcutaneous Q6 months   Continuous Infusions:  PRN Meds:   Allergies:    Allergies  Allergen Reactions   Codeine Swelling    Other reaction(s): rash   Penicillins Hives and Swelling    Has patient had a PCN reaction causing immediate rash, facial/tongue/throat swelling, SOB or lightheadedness with hypotension: Yes Has patient had a PCN reaction causing severe rash involving mucus membranes or skin necrosis: No Has patient had a PCN reaction that required hospitalization No Has patient had a PCN reaction occurring within the last 10 years: No If all of the above answers are "NO", then may proceed with Cephalosporin use.    Prednisone Hives    Other reaction(s): hives   Wool Alcohol [Lanolin] Hives   Empagliflozin Nausea And Vomiting    Other reaction(s): nausea and vomiting   Penicillin G     Other reaction(s): rash   Statins     Other reaction(s): myopathy    Social History:   Social History   Socioeconomic History    Marital status: Married    Spouse name: Talbert Forest   Number of children: 0   Years of education: 14   Highest education level: Not on file  Occupational History    Comment: [employer]  Tobacco Use   Smoking status: Never    Passive exposure: Never   Smokeless tobacco: Never  Vaping Use   Vaping Use: Never used  Substance and Sexual Activity   Alcohol use: No   Drug use: No   Sexual activity: Yes  Other Topics Concern   Not on file  Social History Narrative   Lives with wife   Caffeine- Diet Coke 5 daily   Social Determinants of Health   Financial Resource Strain: Not on file  Food Insecurity: No Food Insecurity (09/29/2022)   Hunger  Vital Sign    Worried About Programme researcher, broadcasting/film/video in the Last Year: Never true    Ran Out of Food in the Last Year: Never true  Transportation Needs: No Transportation Needs (09/29/2022)   PRAPARE - Administrator, Civil Service (Medical): No    Lack of Transportation (Non-Medical): No  Physical Activity: Not on file  Stress: Not on file  Social Connections: Not on file  Intimate Partner Violence: Not At Risk (09/29/2022)   Humiliation, Afraid, Rape, and Kick questionnaire    Fear of Current or Ex-Partner: No    Emotionally Abused: No    Physically Abused: No    Sexually Abused: No    Family History:    Family History  Problem Relation Age of Onset   Heart failure Mother    Cancer - Other Mother        breast   Heart disease Father    Cancer - Other Sister        tongue cancer   Heart attack Sister      ROS:  Please see the history of present illness.   All other ROS reviewed and negative.     Physical Exam/Data:   Vitals:   11/13/22 1447 11/13/22 1449  BP: 127/87   Pulse: 99   Resp: 16   Temp: 98.1 F (36.7 C)   TempSrc: Oral   SpO2: 99%   Weight:  72.6 kg  Height:  5\' 10"  (1.778 m)    Intake/Output Summary (Last 24 hours) at 11/13/2022 1818 Last data filed at 11/13/2022 1736 Gross per 24 hour  Intake  1050 ml  Output --  Net 1050 ml      11/13/2022    2:49 PM 10/02/2022    5:46 AM 09/30/2022    4:06 AM  Last 3 Weights  Weight (lbs) 160 lb 180 lb 12.4 oz 173 lb 11.6 oz  Weight (kg) 72.576 kg 82 kg 78.8 kg     Body mass index is 22.96 kg/m.  General:  Frail older WM, in no acute distress HEENT: normal Neck: no JVD Vascular: No carotid bruits; Distal pulses 2+ bilaterally Cardiac:  normal S1, S2; RRR; no murmur  Lungs:  clear to auscultation bilaterally, no wheezing, rhonchi or rales  Abd: soft, nontender, no hepatomegaly  Ext: no edema Musculoskeletal:  No deformities, BUE and BLE strength normal and equal Skin: warm and dry  Neuro:  CNs 2-12 intact, no focal abnormalities noted Psych:  Normal affect   EKG:  The EKG was personally reviewed and demonstrates: Sinus tachycardia, 100 bpm  Relevant CV Studies:  Cath: 10/02/2022    Mid LAD lesion is 75% stenosed.  A drug-eluting stent was successfully placed using a SYNERGY XD 3.50X38, postdilated to 3.75 mm and optimized with intravascular ultrasound.   Post intervention, there is a 0% residual stenosis.   Ramus-2 lesion is 75% stenosed.   Ramus-1 lesion is 80% stenosed.   Ost Cx to Prox Cx lesion is 50% stenosed- eccentric, most notable in caudal view.   Mid LM to Ost LAD lesion is 40% stenosed.  Smallest Cross-sectional area 6.6 mm by intravascular ultrasound, not significant.   Ost LAD to Prox LAD lesion is 50% stenosed.  Cross-sectional area 4.2 mm, not significant, by intravascular ultrasound for a very short segment, just before the left main.  The remainder of the LAD cross-sectional area is significantly larger.   A drug-eluting stent was successfully placed using a SYNERGY XD 3.50X38.  Continue dual antiplatelet therapy along with aggressive secondary prevention.  Okay to switch to Brilinta if that has less drug interactions with his other neurologic medicines.  Will check with pharmacy prior to  discharge.  Diagnostic Dominance: Right  Intervention    Echo: 09/30/2022  IMPRESSIONS     1. Left ventricular ejection fraction, by estimation, is 60 to 65%. The  left ventricle has normal function. The left ventricle has no regional  wall motion abnormalities. There is mild concentric left ventricular  hypertrophy. Left ventricular diastolic  parameters are consistent with Grade I diastolic dysfunction (impaired  relaxation).   2. Right ventricular systolic function is normal. The right ventricular  size is normal.   3. Left atrial size was mildly dilated.   4. The mitral valve is normal in structure. No evidence of mitral valve  regurgitation. No evidence of mitral stenosis.   5. The aortic valve is tricuspid. There is mild calcification of the  aortic valve. Aortic valve regurgitation is not visualized. Aortic valve  sclerosis/calcification is present, without any evidence of aortic  stenosis. Aortic valve mean gradient  measures 3.0 mmHg. Aortic valve Vmax measures 1.13 m/s.   6. The inferior vena cava is normal in size with greater than 50%  respiratory variability, suggesting right atrial pressure of 3 mmHg.   FINDINGS   Left Ventricle: Left ventricular ejection fraction, by estimation, is 60  to 65%. The left ventricle has normal function. The left ventricle has no  regional wall motion abnormalities. The left ventricular internal cavity  size was normal in size. There is   mild concentric left ventricular hypertrophy. Left ventricular diastolic  parameters are consistent with Grade I diastolic dysfunction (impaired  relaxation).   Right Ventricle: The right ventricular size is normal. No increase in  right ventricular wall thickness. Right ventricular systolic function is  normal.   Left Atrium: Left atrial size was mildly dilated.   Right Atrium: Right atrial size was normal in size.   Pericardium: There is no evidence of pericardial effusion.   Mitral  Valve: The mitral valve is normal in structure. No evidence of  mitral valve regurgitation. No evidence of mitral valve stenosis.   Tricuspid Valve: The tricuspid valve is normal in structure. Tricuspid  valve regurgitation is trivial. No evidence of tricuspid stenosis.   Aortic Valve: The aortic valve is tricuspid. There is mild calcification  of the aortic valve. Aortic valve regurgitation is not visualized. Aortic  valve sclerosis/calcification is present, without any evidence of aortic  stenosis. Aortic valve mean  gradient measures 3.0 mmHg. Aortic valve peak gradient measures 5.1 mmHg.   Pulmonic Valve: The pulmonic valve was normal in structure. Pulmonic valve  regurgitation is not visualized. No evidence of pulmonic stenosis.   Aorta: The aortic root is normal in size and structure.   Venous: The inferior vena cava is normal in size with greater than 50%  respiratory variability, suggesting right atrial pressure of 3 mmHg.   IAS/Shunts: No atrial level shunt detected by color flow Doppler.       Laboratory Data:  High Sensitivity Troponin:   Recent Labs  Lab 11/08/22 1120 11/08/22 1320 11/13/22 1513  TROPONINIHS 5 5 6      Chemistry Recent Labs  Lab 11/08/22 1120 11/13/22 1513  NA 131* 134*  K 3.4* 3.8  CL 98 101  CO2 20* 23  GLUCOSE 172* 139*  BUN 16 15  CREATININE 0.93 0.75  CALCIUM 8.8* 8.9  GFRNONAA >60 >60  ANIONGAP 13 10    Recent Labs  Lab 11/13/22 1513  PROT 6.8  ALBUMIN 3.2*  AST 22  ALT 23  ALKPHOS 82  BILITOT 0.5   Lipids No results for input(s): "CHOL", "TRIG", "HDL", "LABVLDL", "LDLCALC", "CHOLHDL" in the last 168 hours.  Hematology Recent Labs  Lab 11/08/22 1120 11/13/22 1513  WBC 12.5* 10.8*  RBC 4.65 4.49  HGB 14.1 14.2  HCT 42.2 41.1  MCV 90.8 91.5  MCH 30.3 31.6  MCHC 33.4 34.5  RDW 13.6 13.6  PLT 369 341   Thyroid No results for input(s): "TSH", "FREET4" in the last 168 hours.  BNPNo results for input(s): "BNP",  "PROBNP" in the last 168 hours.  DDimer No results for input(s): "DDIMER" in the last 168 hours.   Radiology/Studies:  DG Chest 2 View  Result Date: 11/13/2022 CLINICAL DATA:  Chest pain, shortness of breath. EXAM: CHEST - 2 VIEW COMPARISON:  Nov 08, 2022. FINDINGS: The heart size and mediastinal contours are within normal limits. Both lungs are clear. Status post midthoracic kyphoplasty. IMPRESSION: No active cardiopulmonary disease. Electronically Signed   By: Lupita Raider M.D.   On: 11/13/2022 16:00     Assessment and Plan:   William Hunter is a 77 y.o. male with a hx of CAD, recurrent DVT/PE on Eliquis, hypertension combined with anemia, diabetes who is being seen 11/13/2022 for the evaluation of chest pain at the request of  Dr. Durwin Nora.  Chest pain -- Presents back to the ED with ongoing chest pain, this is reproducible on palpation.  Initial high-sensitivity 6.  EKG sinus tachycardia with no acute ST/T wave abnormalities.  Low suspicion for ACS.  Does have room to increase his antianginal regimen, would recommend increasing metoprolol XL from 25 mg daily to 50 mg daily. Increase Imdur to 30mg  daily  -- Continue Plavix, statin, Imdur, beta-blocker  CAD -- Recent DES to LAD, residual disease treated medically.  Currently on Plavix as monotherapy given the need for Eliquis.  No aspirin.  Reports compliant with medications.  Recurrent DVT/PE -- On Eliquis  Hypertension -- Blood pressures are elevated while in the ED -- As above, increase metoprolol XL to 50 mg daily  Hyperlipidemia -- History of statin intolerance, previously referred to lipid clinic   For questions or updates, please contact [facility] Please consult [email or web address] for contact info under    Signed, Laverda Page, NP  11/13/2022 6:18 PM   Patient seen and examined. Agree with assessment and plan.  Mr. Tyrus "Kathlene November" Deeter is a 77 year old gentleman with remote history of MI in 80.  He has a history  of hypertension, diabetes mellitus, rheumatoid arthritis, as well as recurrent DVT/PE for which she is on anticoagulation therapy.  He underwent recent cardiac catheterization April 2024 days later via the femoral approach underwent DES stenting of his proximal to mid LAD of his LAD.  He has concomitant CAD with 40% narrowing in his distal left main, as well as 75% ramus intermediate stenosis, 60% ostial/proximal circumflex and percent proximal RCA with distal PDA 75% stenosis.  He denies any similar type chest pain but has experienced chest pain leading to his ER evaluation on Nov 08, 2022.  At that time he was experiencing chest pain for over 10 hours duration.  Troponins were negative.  ECG was unremarkable.  The patient essentially has been staying in a recliner since he has difficulty walking.  He is in the ICU at the hospital after fracturing several  bones.  He has experienced some pulsating chest sensation and has experienced symptomatology over the last several days.  He presented to the emergency room tonight.  Blood pressure on presentation was normal at 127/87.  Resting pulse is in the upper 90s to 100 range.  Bearded.  There is no JVD.  His lungs were without rales.  Rhythm was regular with no S3 gallop.  There was a faint 1/6 systolic murmur.  He had left chest wall tenderness to palpation over the left pectoral region.  Abdomen was soft nontender.  No significant edema.  Laboratories notable for troponins low at 5, 5, and 6.  Sodium 134 slightly improved from 1 week ago.  Potassium now normal at 3.8.  Glucose 139.  CBC stable with hemoglobin 14.2 hematocrit 41.1.  ECG is unremarkable and shows sinus tachycardia 100 bpm with low voltage and no significant ST-T abnormalities.  I do not believe the patient's chest pain on presentation today is significantly ischemic in etiology.  He does have concomitant CAD.  With his heart rate at 100, I would recommend further titration of metoprolol succinate 25 mg up  to 50 mg.  He has only been on very low-dose isosorbide at 15 mg.  I would recommend increasing this to 30 mg daily.  I do not feel cardiac hospitalization is indicated, deferred to ER mission or hospitalist if hospitalization is warranted.  Continue clopidogrel, Eliquis, statin.  Follow-up office visit with his primary cardiologist Dr. Amado Coe, MD, [facility] 11/13/2022 6:37 PM

## 2022-11-14 LAB — CBG MONITORING, ED
Glucose-Capillary: 149 mg/dL — ABNORMAL HIGH (ref 70–99)
Glucose-Capillary: 89 mg/dL (ref 70–99)
Glucose-Capillary: 126 mg/dL — ABNORMAL HIGH (ref 70–99)
Glucose-Capillary: 111 mg/dL — ABNORMAL HIGH (ref 70–99)

## 2022-11-14 MED ORDER — ACETAMINOPHEN 500 MG PO TABS
1000.0000 mg | ORAL_TABLET | Freq: Once | ORAL | Status: AC
  Administered 2022-11-14: 1000 mg via ORAL
  Filled 2022-11-14: qty 2

## 2022-11-14 NOTE — ED Notes (Signed)
This RN and PT cleaned and changed pt into clean brief and bed sheets. Meplilex placed on sacrum. Pt in POC and NAD noted at this time.

## 2022-11-14 NOTE — Evaluation (Signed)
Physical Therapy Evaluation Patient Details Name: William Hunter MRN: [id number] DOB: [birth year] Today's Date: 11/14/2022  History of Present Illness  Patient is 77 y.o. male presenting to the ED on 11/13/22 with chest pain, nausea and vomiting. Work up in ED unremarkable with EKG showing NSR. PMH significant for NSTEMI on 09/29/22, s/p cardiac cath 4/22 and stent on 10/02/22; OA, CAD, HTN, RA, DMII, DVT/PE.   Clinical Impression  William Hunter is 77 y.o. male admitted with above HPI and diagnosis. Patient is currently limited by functional impairments below (see PT problem list). Patient recently admitted in April and discharge to SNF rehab before returning home around ~10/23/22 (exact date unknown). Pt reports since return home he has been limited severely with mobility and has remained chair-bound in recliner unable to stand and even for toileting. Pt has had assistance from spouse at home until ~8 days ago when spouse was in [facility] and is now admitted to ICU herself. Today pt required Max-Total Assist to complete supine<>sit and became orthostatic sitting EOB. Patient will benefit from continued skilled PT interventions to address impairments and progress independence with mobility. Acute PT will follow and progress as able. Patient will benefit from inpatient follow up therapy, <3 hours/day.         Recommendations for follow up therapy are one component of a multi-disciplinary discharge planning process, led by the attending physician.  Recommendations may be updated based on patient status, additional functional criteria and insurance authorization.  Follow Up Recommendations Can patient physically be transported by private vehicle: No     Assistance Recommended at Discharge Frequent or constant Supervision/Assistance  Patient can return home with the following  Two people to help with walking and/or transfers;Two people to help with bathing/dressing/bathroom;Assistance with  cooking/housework;Assistance with feeding;Direct supervision/assist for medications management;Direct supervision/assist for financial management;Assist for transportation;Help with stairs or ramp for entrance    Equipment Recommendations None recommended by PT (TBD)  Recommendations for Other Services       Functional Status Assessment Patient has had a recent decline in their functional status and/or demonstrates limited ability to make significant improvements in function in a reasonable and predictable amount of time     Precautions / Restrictions Precautions Precautions: Fall Precaution Comments: watch orthostatics Restrictions Weight Bearing Restrictions: No      Mobility  Bed Mobility Overal bed mobility: Needs Assistance Bed Mobility: Supine to Sit, Sit to Supine, Rolling Rolling: Min assist   Supine to sit: HOB elevated, Max assist Sit to supine: Total assist, +2 for physical assistance, +2 for safety/equipment   General bed mobility comments: patient required min assist and use of bed rails to roll on stretcher, cues/assist to flex knee of upper LE to facilitate lower trunk roll. Max assist required to bring Le's off EOB and raise trunk to stabilize balance at EOB. Pt reported dizziness and BP had orthostatic drop from 120s/70s to 80s/50s sitting EOB. Total Assist +2 to return to supine and boost superior in bed to reposition.    Transfers                   General transfer comment: unable to test (orthostatic)    Ambulation/Gait                  Stairs            Wheelchair Mobility    Modified Rankin (Stroke Patients Only)       Balance Overall balance assessment: Needs assistance, History  of Falls Sitting-balance support: Bilateral upper extremity supported Sitting balance-Leahy Scale: Poor Sitting balance - Comments: reliant on support to prevent posterior LOB Postural control: Posterior lean                                    Pertinent Vitals/Pain Pain Assessment Pain Assessment: 0-10 Pain Score: 5  Pain Location: back and bil knee Pain Descriptors / Indicators: Aching Pain Intervention(s): Limited activity within patient's tolerance, Monitored during session, Repositioned    Home Living Family/patient expects to be discharged to:: Private residence Living Arrangements: Spouse/significant other Available Help at Discharge: Other (Comment) Type of Home: House Home Access: Stairs to enter Entrance Stairs-Rails: None Entrance Stairs-Number of Steps: 1   Home Layout: One level Home Equipment: Agricultural consultant (2 wheels);Cane - single point;Wheelchair - manual;Toilet riser;Grab bars - tub/shower;Grab bars - toilet;Shower seat;Hand held shower head Additional Comments: 3 falls in the last 6 months d/t legs giving out PTA in April. pt reprots since returnig home from [facility] he has been unable to ambulate. Wednesday 5/15 returned home. has only doen partial stands with HHPT but unable to walk. PT came out 2x.    Prior Function               Mobility Comments: Pt reports he was amb with RW for household distance PTA in April. Needed assist some of the time and at other times able to amb modified independent ADLs Comments: pt reports PTA in April being independent in bADLs. Daughter assisted with iADLs. Wifes managed meds. Wife has been in hospital due to [facility].     Hand Dominance   Dominant Hand: Left    Extremity/Trunk Assessment   Upper Extremity Assessment Upper Extremity Assessment: Generalized weakness    Lower Extremity Assessment Lower Extremity Assessment: Generalized weakness    Cervical / Trunk Assessment Cervical / Trunk Assessment: Kyphotic  Communication   Communication: No difficulties  Cognition Arousal/Alertness: Awake/alert Behavior During Therapy: WFL for tasks assessed/performed Overall Cognitive Status: Within Functional Limits for tasks assessed                                           General Comments General comments (skin integrity, edema, etc.): pt in brief and completely saturated with urine.    Exercises     Assessment/Plan    PT Assessment Patient needs continued PT services  PT Problem List Decreased strength;Decreased range of motion;Decreased activity tolerance;Decreased balance;Decreased mobility;Decreased knowledge of use of DME;Decreased safety awareness;Decreased knowledge of precautions;Cardiopulmonary status limiting activity;Decreased skin integrity       PT Treatment Interventions DME instruction;Gait training;Stair training;Functional mobility training;Therapeutic activities;Therapeutic exercise;Balance training;Neuromuscular re-education;Cognitive remediation;Patient/family education;Wheelchair mobility training    PT Goals (Current goals can be found in the Care Plan section)  Acute Rehab PT Goals Patient Stated Goal: to get to rehab and improve tolerance to mobility PT Goal Formulation: With patient Time For Goal Achievement: 11/28/22 Potential to Achieve Goals: Fair    Frequency Min 2X/week     Co-evaluation               AM-PAC PT "6 Clicks" Mobility  Outcome Measure Help needed turning from your back to your side while in a flat bed without using bedrails?: Total Help needed moving from lying on your back to sitting on  the side of a flat bed without using bedrails?: Total Help needed moving to and from a bed to a chair (including a wheelchair)?: Total Help needed standing up from a chair using your arms (e.g., wheelchair or bedside chair)?: Total Help needed to walk in hospital room?: Total Help needed climbing 3-5 steps with a railing? : Total 6 Click Score: 6    End of Session Equipment Utilized During Treatment: Gait belt Activity Tolerance: Treatment limited secondary to medical complications (Comment);Patient tolerated treatment well (orthostatic hypotension) Patient left:  in bed;with call bell/phone within reach Nurse Communication: Mobility status PT Visit Diagnosis: Difficulty in walking, not elsewhere classified (R26.2);Other symptoms and signs involving the nervous system (R29.898);Muscle weakness (generalized) (M62.81);History of falling (Z91.81);Other abnormalities of gait and mobility (R26.89);Unsteadiness on feet (R26.81)    Time: 6045-4098 PT Time Calculation (min) (ACUTE ONLY): 26 min   Charges:   PT Evaluation $PT Eval Moderate Complexity: 1 Mod PT Treatments $Therapeutic Activity: 8-22 mins        Wynn Maudlin, DPT Acute Rehabilitation Services Office [phone]  11/14/22 12:05 PM

## 2022-11-14 NOTE — ED Notes (Signed)
Pitcher of water left at beside for patient

## 2022-11-14 NOTE — Progress Notes (Signed)
Insurance authorization started for [employer]

## 2022-11-14 NOTE — ED Notes (Signed)
Pt in bed, took morning meds with water, otherwise comfortably rest in a semi-upright position

## 2022-11-14 NOTE — TOC Initial Note (Signed)
Transition of Care [facility]) - Initial/Assessment Note    Patient Details  Name: William Hunter MRN: [id number] Date of Birth: January 29, 1946  [facility] of [facility]) CM/SW Contact:    William Hunter, LCSWA Phone Number: 11/14/2022, 11:21 AM  Clinical Narrative: CSW spoke with admissions, William Hunter at [facility] who told CSW that patient might be in copay days and would have to pay $203 daily. Patient was with William Hunter from 4/25-5/15 and also has a $825 balance and would have to pay some towards that balance. CSW spoke with the granddaughter William Hunter who stated patients spouse is in the ICU and they do not have money to pay out of pocket for rehab. CSW suggested they apply for medicaid. Patient receives around $1700 monthly. CSW told William Hunter she will update her if insurance is able to offer another SNF stay.              Expected Discharge Plan: Skilled Nursing Facility Barriers to Discharge: Continued Medical Work up   Patient Goals and CMS Choice Patient states their goals for this hospitalization and ongoing recovery are:: NA CMS Medicare.gov Compare Post Acute Care list provided to:: Patient Represenative (must comment) (Adult Granddaughter) Choice offered to / list presented to : Adult Children      Expected Discharge Plan and Services       Living arrangements for the past 2 months: Skilled Nursing Facility, Single Family Home                                      Prior Living Arrangements/Services Living arrangements for the past 2 months: Skilled Nursing Facility, Single Family Home Lives with:: Spouse Patient language and need for interpreter reviewed:: Yes        Need for Family Participation in Patient Care: Yes (Comment) Care giver support system in place?: Yes (comment) Current home services: Home PT, Home OT, Home RN Criminal Activity/Legal Involvement Pertinent to Current Situation/Hospitalization: No - Comment as needed  Activities of Daily Living       Permission Sought/Granted                  Emotional Assessment Appearance:: Appears stated age     Orientation: : Oriented to Self, Oriented to Place, Oriented to  Time, Oriented to Situation Alcohol / Substance Use: Not Applicable Psych Involvement: No (comment)  Admission diagnosis:  cp Patient Active Problem List   Diagnosis Date Noted   Elevated troponin 09/30/2022   Chest pain 09/29/2022   High risk medication use 09/26/2022   Neuropathy 04/10/2022   Abnormal finding on MRI of brain 04/10/2022   Gait abnormality 01/31/2022   Aortic atherosclerosis ([name] 12/09/2021   History of pulmonary embolism 12/09/2021   Pure hypercholesterolemia 12/09/2021   Bilateral leg weakness 11/25/2021   Leg weakness, bilateral 11/25/2021   Lower extremity weakness 11/25/2021   Statin myopathy 02/22/2020   Acute respiratory failure with hypoxia ([name] 07/03/2019   Acute respiratory failure due to COVID-19 ([name] 07/03/2019   Migraine with aura and without status migrainosus, not intractable 09/02/2016   Diabetic polyneuropathy associated with type 2 diabetes mellitus ([name] 09/02/2016   Rheumatoid arthritis ([name] 09/02/2016   Coronary vasospasm ([name] 04/06/2014   Gallstones 03/10/2012   Abdominal pain 03/09/2012   NSTEMI (non-ST elevation myocardial infarction) ([name]    Coronary artery disease    Hypertension    Dyslipidemia    PCP:  Irena Reichmann, DO Pharmacy:   [id number] - [place], [place] - [address] [place] [address] Phone: [phone] Fax: [phone]  [facility] [employer] [facility] - [place], [place] - [address] [place] [address] Phone: [phone] Fax: [phone]     Social Determinants of Health (SDOH) Social History: SDOH Screenings   Food Insecurity: No Food Insecurity (09/29/2022)  Housing: Low Risk  (09/29/2022)  Transportation Needs: No  Transportation Needs (09/29/2022)  Utilities: Not At Risk (09/29/2022)  Tobacco Use: Low Risk  (11/13/2022)   SDOH Interventions:     Readmission Risk Interventions     No data to display

## 2022-11-14 NOTE — ED Notes (Signed)
Family at bedside and gave news about pt's wife. Pt now crying in room.

## 2022-11-14 NOTE — ED Notes (Signed)
Family at bedside. 

## 2022-11-14 NOTE — Progress Notes (Signed)
Pts ins Berkley Harvey is pending.  Clora Ohmer Tarpley-Carter, MSW, LCSW-A Pronouns:  She/Her/Hers [facility] Clinical Social Worker Direct Number:  [phone] [email or web address]

## 2022-11-14 NOTE — ED Notes (Signed)
Pt states that his wife passed away today. He appears sad and somewhat flat. Expresses some concern for the future. Denies any suicidal ideation.

## 2022-11-15 LAB — CBG MONITORING, ED
Glucose-Capillary: 124 mg/dL — ABNORMAL HIGH (ref 70–99)
Glucose-Capillary: 114 mg/dL — ABNORMAL HIGH (ref 70–99)
Glucose-Capillary: 179 mg/dL — ABNORMAL HIGH (ref 70–99)
Glucose-Capillary: 141 mg/dL — ABNORMAL HIGH (ref 70–99)

## 2022-11-15 NOTE — Progress Notes (Signed)
Insurance authorization approved. William Hunter with admissions at [facility] has been made aware. Patient cannot discharge until family pays $825. CSW notified patients granddaughter [name] who stated she will call and make a payment.

## 2022-11-15 NOTE — ED Provider Notes (Signed)
Received a call from social work notifying me that this patient's wife, who was a patient in the ICU, unfortunately has passed away. The family had originally been planning on picking this patient up today to take to SNF but because of his wife's death have had to make other arrangements. The new plan is that patient will board here until Monday.    Loetta Rough, MD 11/15/22 [phone]

## 2022-11-15 NOTE — Progress Notes (Signed)
Pt is going to [facility].  TOC CSW has received call report information as follows.  Room #:  422  Call Report #:  [phone]  This information will also be shared with RN.  Dilan Novosad Tarpley-Carter, MSW, LCSW-A Pronouns:  She/Her/Hers [facility] Clinical Social Worker Direct Number:  [phone] [email or web address]

## 2022-11-15 NOTE — ED Provider Notes (Signed)
Emergency Medicine Observation Re-evaluation Note  William Hunter is a 77 y.o. male, seen on rounds today.  Pt initially presented to the ED for complaints of Chest Pain Currently, the patient is resting in bed.  Physical Exam  BP 120/74 (BP Location: Right Arm)   Pulse 85   Temp 98.2 F (36.8 C) (Oral)   Resp 20   Ht 5\' 10"  (1.778 m)   Wt 72.6 kg   SpO2 98%   BMI 22.96 kg/m  Physical Exam General: nad Cardiac: regular Lungs: clear Psych: calm and cooperative  ED Course / MDM  EKG:EKG Interpretation  Date/Time:  Wednesday November 13 2022 14:45:25 EDT Ventricular Rate:  100 PR Interval:  188 QRS Duration: 88 QT Interval:  353 QTC Calculation: 456 R Axis:   45 Text Interpretation: Sinus tachycardia Low voltage, precordial leads No significant change since last tracing Confirmed by Elayne Snare ([id number]) on 11/13/2022 3:03:22 PM  I have reviewed the labs performed to date as well as medications administered while in observation.  Recent changes in the last 24 hours include none.  Plan  Current plan is for seeking placement.    Gwyneth Sprout, MD 11/15/22 [phone]

## 2022-11-15 NOTE — Progress Notes (Signed)
TOC CSW spoke with Lowella Bandy at [facility].  Pt will have to stay until Monday (11/18/2022).  There won't be anyone at facility to receive payment.  Pts dr has been notified as well.  Armie Moren Tarpley-Carter, MSW, LCSW-A Pronouns:  She/Her/Hers [facility] Care Clinical Social Worker Direct Number:  [phone] [email or web address]

## 2022-11-16 DIAGNOSIS — I2510 Atherosclerotic heart disease of native coronary artery without angina pectoris: Secondary | ICD-10-CM | POA: Diagnosis not present

## 2022-11-16 DIAGNOSIS — M069 Rheumatoid arthritis, unspecified: Secondary | ICD-10-CM | POA: Diagnosis not present

## 2022-11-16 DIAGNOSIS — G4700 Insomnia, unspecified: Secondary | ICD-10-CM | POA: Diagnosis not present

## 2022-11-16 DIAGNOSIS — K529 Noninfective gastroenteritis and colitis, unspecified: Secondary | ICD-10-CM | POA: Diagnosis not present

## 2022-11-16 DIAGNOSIS — R531 Weakness: Secondary | ICD-10-CM | POA: Diagnosis not present

## 2022-11-16 DIAGNOSIS — R262 Difficulty in walking, not elsewhere classified: Secondary | ICD-10-CM | POA: Diagnosis not present

## 2022-11-16 DIAGNOSIS — I10 Essential (primary) hypertension: Secondary | ICD-10-CM | POA: Diagnosis not present

## 2022-11-16 DIAGNOSIS — E1142 Type 2 diabetes mellitus with diabetic polyneuropathy: Secondary | ICD-10-CM | POA: Diagnosis not present

## 2022-11-16 DIAGNOSIS — R2681 Unsteadiness on feet: Secondary | ICD-10-CM | POA: Diagnosis not present

## 2022-11-16 DIAGNOSIS — I25110 Atherosclerotic heart disease of native coronary artery with unstable angina pectoris: Secondary | ICD-10-CM | POA: Diagnosis not present

## 2022-11-16 DIAGNOSIS — E1165 Type 2 diabetes mellitus with hyperglycemia: Secondary | ICD-10-CM | POA: Diagnosis not present

## 2022-11-16 DIAGNOSIS — Z794 Long term (current) use of insulin: Secondary | ICD-10-CM | POA: Diagnosis not present

## 2022-11-16 DIAGNOSIS — R2689 Other abnormalities of gait and mobility: Secondary | ICD-10-CM | POA: Diagnosis not present

## 2022-11-16 DIAGNOSIS — Z7401 Bed confinement status: Secondary | ICD-10-CM | POA: Diagnosis not present

## 2022-11-16 DIAGNOSIS — Z7901 Long term (current) use of anticoagulants: Secondary | ICD-10-CM | POA: Diagnosis not present

## 2022-11-16 DIAGNOSIS — R1312 Dysphagia, oropharyngeal phase: Secondary | ICD-10-CM | POA: Diagnosis not present

## 2022-11-16 DIAGNOSIS — Z79899 Other long term (current) drug therapy: Secondary | ICD-10-CM | POA: Diagnosis not present

## 2022-11-16 DIAGNOSIS — F338 Other recurrent depressive disorders: Secondary | ICD-10-CM | POA: Diagnosis not present

## 2022-11-16 DIAGNOSIS — M6281 Muscle weakness (generalized): Secondary | ICD-10-CM | POA: Diagnosis not present

## 2022-11-16 DIAGNOSIS — R0789 Other chest pain: Secondary | ICD-10-CM | POA: Diagnosis not present

## 2022-11-16 DIAGNOSIS — K5289 Other specified noninfective gastroenteritis and colitis: Secondary | ICD-10-CM | POA: Diagnosis not present

## 2022-11-16 DIAGNOSIS — I214 Non-ST elevation (NSTEMI) myocardial infarction: Secondary | ICD-10-CM | POA: Diagnosis not present

## 2022-11-16 DIAGNOSIS — E119 Type 2 diabetes mellitus without complications: Secondary | ICD-10-CM | POA: Diagnosis not present

## 2022-11-16 LAB — CBG MONITORING, ED
Glucose-Capillary: 138 mg/dL — ABNORMAL HIGH (ref 70–99)
Glucose-Capillary: 153 mg/dL — ABNORMAL HIGH (ref 70–99)

## 2022-11-16 NOTE — Progress Notes (Signed)
TOC CSW received a call from [place] at [facility] to go ahead and send pt, they will collect money from family on Monday (11/18/2022).  Call report information will be shared with RN.   Room #: 422  Call Report #: [id number]   Sallyann Kinnaird Tarpley-Carter, MSW, LCSW-A Pronouns:  She/Her/Hers [facility] Clinical Social Worker Direct Number:  [phone] [email or web address]

## 2022-11-16 NOTE — ED Notes (Signed)
Pt. sleeping, vitals stable

## 2022-11-16 NOTE — ED Notes (Signed)
[facility] called, 4th in line 

## 2022-11-16 NOTE — Discharge Planning (Signed)
Licensed Clinical Social Worker is seeking post-discharge placement for this patient at the following level of care: SNF    

## 2022-11-18 DIAGNOSIS — M6281 Muscle weakness (generalized): Secondary | ICD-10-CM | POA: Diagnosis not present

## 2022-11-18 DIAGNOSIS — F338 Other recurrent depressive disorders: Secondary | ICD-10-CM | POA: Diagnosis not present

## 2022-11-18 DIAGNOSIS — R2681 Unsteadiness on feet: Secondary | ICD-10-CM | POA: Diagnosis not present

## 2022-11-18 DIAGNOSIS — E1165 Type 2 diabetes mellitus with hyperglycemia: Secondary | ICD-10-CM | POA: Diagnosis not present

## 2022-11-18 DIAGNOSIS — K5289 Other specified noninfective gastroenteritis and colitis: Secondary | ICD-10-CM | POA: Diagnosis not present

## 2022-11-18 DIAGNOSIS — I10 Essential (primary) hypertension: Secondary | ICD-10-CM | POA: Diagnosis not present

## 2022-11-18 DIAGNOSIS — R2689 Other abnormalities of gait and mobility: Secondary | ICD-10-CM | POA: Diagnosis not present

## 2022-11-18 DIAGNOSIS — M069 Rheumatoid arthritis, unspecified: Secondary | ICD-10-CM | POA: Diagnosis not present

## 2022-11-19 ENCOUNTER — Encounter: Payer: Self-pay | Admitting: Neurology

## 2022-11-19 ENCOUNTER — Ambulatory Visit: Payer: Medicare HMO | Admitting: Neurology

## 2022-11-19 NOTE — Progress Notes (Deleted)
Patient: William Hunter Date of Birth: 15-Dec-1945  Reason for Visit: Follow up History from: Patient Primary Neurologist: Terrace Arabia   ASSESSMENT AND PLAN 77 y.o. year old male   1.  Weakness 2.  Gait abnormality -History of long-term sedentary lifestyle, deconditioning, chronic pain, rheumatoid arthritis, poorly controlled diabetes A1c 9.7 -EMG nerve conduction study confirmed severe length-dependent axonal sensorimotor polyneuropathy, with marked mixed demyelinating features, no evidence of intrinsic muscle disease  -Thyroid has shown suppressed TSH with normal total T4, free thyroxine index  3.  Abnormal MRI of the brain -June 2023 MRI of the brain significant ventriculomegaly out of proportion to mild generalized atrophy, ventriculomegaly was also present on previous CT scan -Dr. Terrace Arabia felt In the absence of progressive dementia, incontinence, less likely NPH  4.  Rigidity, bradykinesia -November 2023 Dr. Terrace Arabia started Sinemet 25/100 mg  HISTORY  William Hunter, is a 77 year old male, seen in request by his [facility] care doctor   Irena Reichmann for evaluation of weakness, he is accompanied by his wife and daughter at today's clinical visit January 31, 2022   I reviewed and summarized the referring note. PMHX DVT, PE, on Eliquis RA--under the care of Rheumatologist Dr. Isabelle Course at [facility] HTN DM CAD HLD Kyphoplasty in Dec 2022.     He had hospital admission in June 2023, for progressive lower extremity weakness, gait abnormalities, to the point that he was confined to recliner prior to hospital admission,   He had extensive evaluation, I personally reviewed MRI of the brain with without contrast, chronic disproportional ventriculomegaly, moderate small vessel disease, I also reviewed previous multiple CT head, he was noted to have ventriculomegaly as far back in 2006   MRI of cervical spine, mild degenerative changes no evidence of cervical compression   MRI of thoracic spine  chronic compression deformity of T5, status post kyphoplasty, no cord compression   MRI of the lumbar spine mild degenerative changes no significant canal foraminal narrowing   Extensive laboratory evaluation: Normal hemoglobin 15.8, C-reactive protein, ESR, HIV, B6, folic acid, hepatitis panel, vitamin D, protein electrophoresis, B1, B12, CPK, lipase, magnesium 1.7, CMP normal creatinine, elevated glucose 215, A1C was 9.7   Patient used to work as a Immunologist, 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75, when he was walking, he needs to stand up, driving, talk with people, was functioning okay, since retirement he become very sedentary, especially after he suffered COVID infection in January 2021, acute respiratory failure due to COVID-pneumonia requiring hospitalization,   He was found to have acute DVT at the left lower extremity, put on Eliquis treatment,   Since that hospital admission, he lied in bed in couple weeks could not even get up to her wife, then become more sedentary, spent most of the time in a sitting position watching TV, also suffered significant rheumatoid arthritis, multiple joints pain, especially knee pain, worry about fall injury.   He had few years of bilateral feet numbness mainly involving plantar surface and toes, denies bowel or bladder incontinence, chronic low back pain, compression fracture of T5 require kyphoplasty December 2022, over the past 6 months he has lost significant weight   With rehabilitation, he made some progress, he can ambulate with his walker now, still complains of generalized weakness   Family also noticed since June admission, he had a tendency to flex his neck,   Update April 10, 2022: He return for electrodiagnostic study today which showed evidence of severe length-dependent axonal sensorimotor polyneuropathy, with some demyelinating  features, in addition there is evidence of moderately severe right carpal tunnel syndromes.  There is no evidence of intrinsic  muscle disease   He is now receiving home physical therapy, couple times each week, made significant improvement, walk with a walker today, from parking lot to the exam room, last visit he was in wheelchair  Update November 19, 2022 SS:   REVIEW OF SYSTEMS: Out of a complete 14 system review of symptoms, the patient complains only of the following symptoms, and all other reviewed systems are negative.  See HPI  ALLERGIES: Allergies  Allergen Reactions   Codeine Swelling and Rash   Penicillins Hives and Swelling    Has patient had a PCN reaction causing immediate rash, facial/tongue/throat swelling, SOB or lightheadedness with hypotension: Yes Has patient had a PCN reaction causing severe rash involving mucus membranes or skin necrosis: No Has patient had a PCN reaction that required hospitalization No Has patient had a PCN reaction occurring within the last 10 years: No If all of the above answers are "NO", then may proceed with Cephalosporin use.    Prednisone Hives   Wool Alcohol [Lanolin] Hives   Empagliflozin Nausea And Vomiting   Statins Other (See Comments)    Myopathy    Penicillin G Rash    HOME MEDICATIONS: Outpatient Medications Prior to Visit  Medication Sig Dispense Refill   amLODipine (NORVASC) 5 MG tablet Take 1 tablet (5 mg total) by mouth daily.     apixaban (ELIQUIS) 5 MG TABS tablet Take 1 tablet (5 mg total) by mouth 2 (two) times daily. 60 tablet    BD PEN NEEDLE MICRO U/F 32G X 6 MM MISC Inject into the skin 3 (three) times daily.     BD PEN NEEDLE NANO 2ND GEN 32G X 4 MM MISC 1 each by Other route as directed.     carbidopa-levodopa (SINEMET IR) 25-100 MG tablet Take 1 tablet by mouth 3 (three) times daily. 90 tablet 6   clopidogrel (PLAVIX) 75 MG tablet Take 1 tablet (75 mg total) by mouth daily with breakfast.     Continuous Glucose Sensor (FREESTYLE LIBRE 2 SENSOR) MISC Place onto the skin.     diltiazem (TIADYLT ER) 300 MG 24 hr capsule Take 300 mg by  mouth daily.     famotidine (PEPCID) 20 MG tablet Take 1 tablet (20 mg total) by mouth daily. 30 tablet 0   folic acid (FOLVITE) 1 MG tablet Take 1 tablet (1 mg total) by mouth daily. 90 tablet 3   GVOKE HYPOPEN 2-PACK 1 MG/0.2ML SOAJ Inject 1 mg into the skin as needed (hypoglycemia).     HUMIRA, 2 PEN, 40 MG/0.4ML PNKT Inject 0.4 mLs into the muscle every 14 (fourteen) days. 2 each 2   hydroxychloroquine (PLAQUENIL) 200 MG tablet Take 1 tablet (200 mg total) by mouth daily. 90 tablet 0   insulin aspart (NOVOLOG) 100 UNIT/ML injection 0-5 Units, Subcutaneous, Daily at bedtime: HS scale CBG < 70: implement hypoglycemia measures CBG 70 - 120: 0 units CBG 121 - 150: 0 units CBG 151 - 200: 0 units CBG 201 - 250: 2 units CBG 251 - 300: 3 units CBG 301 - 350: 4 units CBG 351 - 400: 5 units CBG > 400: call MD and obtain STAT lab verification (Patient not taking: Reported on 11/14/2022) 10 mL 11   insulin aspart (NOVOLOG) 100 UNIT/ML injection 0-15 Units, Subcutaneous, 3 times daily with meals CBG < 70: Implement Hypoglycemia Standing Orders and refer  to Hypoglycemia Standing Orders sidebar report CBG 70 - 120: 0 units CBG 121 - 150: 2 units CBG 151 - 200: 3 units CBG 201 - 250: 5 units CBG 251 - 300: 8 units CBG 301 - 350: 11 units CBG 351 - 400: 15 units CBG > 400: call MD and obtain STAT lab verification 10 mL 11   isosorbide mononitrate (IMDUR) 30 MG 24 hr tablet Take 0.5 tablets (15 mg total) by mouth daily. (Patient not taking: Reported on 11/14/2022)     meclizine (ANTIVERT) 25 MG tablet Take 25 mg by mouth 3 (three) times daily as needed for dizziness.     methotrexate (RHEUMATREX) 2.5 MG tablet Take 6 tablets (15 mg total) by mouth once a week. Caution:Chemotherapy. Protect from light. (Patient taking differently: Take 15 mg by mouth once a week. Tuesdays. Caution: Chemotherapy. Protect from light.) 72 tablet 0   metoprolol succinate (TOPROL-XL) 25 MG 24 hr tablet Take 1 tablet (25 mg total) by mouth daily.      MYRBETRIQ 25 MG TB24 tablet Take 25 mg by mouth daily.     nitroGLYCERIN (NITROSTAT) 0.4 MG SL tablet Place 1 tablet (0.4 mg total) under the tongue every 5 (five) minutes x 3 doses as needed for chest pain.  12   ondansetron (ZOFRAN) 4 MG tablet Take 1 tablet (4 mg total) by mouth every 6 (six) hours. 12 tablet 0   pantoprazole (PROTONIX) 40 MG tablet Take 1 tablet (40 mg total) by mouth daily at 6 (six) AM.     Study - ORION 4 - inclisiran 300 mg/1.30mL or placebo SQ injection (PI-Stuckey) Inject 1.5 mLs (300 mg total) into the skin every 6 (six) months. (Patient not taking: Reported on 09/26/2022) 1 mL 1   sucralfate (CARAFATE) 1 g tablet Take 1 tablet (1 g total) by mouth 4 (four) times daily -  with meals and at bedtime. 30 tablet 0   TRESIBA FLEXTOUCH 200 UNIT/ML FlexTouch Pen Inject 20 Units into the skin at bedtime.     Facility-Administered Medications Prior to Visit  Medication Dose Route Frequency Provider Last Rate Last Admin   Study - ORION 4 - inclisiran 300 mg/1.2mL or placebo SQ injection (PI-Stuckey)  300 mg Subcutaneous Q6 months Herby Abraham, MD        PAST MEDICAL HISTORY: Past Medical History:  Diagnosis Date   Anginal pain ([name]    Arthritis    "fingers" (03/09/2012)   Cold feet    "from my diabetes; they stay cold" (03/09/2012)   Coronary artery disease    Coronary artery spasm [facility])    since age 77 Dr Aleen Campi   COVID 07/06/2019   hospitalizied   Dyslipidemia    Femoral DVT (deep venous thrombosis) ([name]    left   Headache(784.0)    Hx of gallstones    Dr Derrell Lolling lap cholecystectomy   Hx of plastic surgery    plastic surgery following a fall off a loading dock ,lost  all his upper teeth    Hypertension    MI, old    at age 87   Migraines    Pneumonia ~ 1962; 1970's   "double"; "regular" (03/09/2012)   Rheumatoid arthritis ([name]    Dr Dierdre Forth    Slipped cervical disc 06/10/2006   Dr Patsi Sears in the past/ Dr Larita Fife , chiropractor in 2008    Thyroid disease    Dr Lucianne Muss   Type II diabetes mellitus ([name]     PAST SURGICAL  HISTORY: Past Surgical History:  Procedure Laterality Date   CARDIAC CATHETERIZATION     x 3 with NO stents    CHOLECYSTECTOMY  03/11/2012   Procedure: LAPAROSCOPIC CHOLECYSTECTOMY WITH INTRAOPERATIVE CHOLANGIOGRAM;  Surgeon: Ernestene Mention, MD;  Location: [facility] OR;  Service: General;  Laterality: N/A;   CORONARY STENT INTERVENTION N/A 10/02/2022   Procedure: CORONARY STENT INTERVENTION;  Surgeon: Corky Crafts, MD;  Location: [facility] INVASIVE CV LAB;  Service: Cardiovascular;  Laterality: N/A;   CORONARY ULTRASOUND/IVUS N/A 10/02/2022   Procedure: Coronary Ultrasound/IVUS;  Surgeon: Corky Crafts, MD;  Location: [facility] INVASIVE CV LAB;  Service: Cardiovascular;  Laterality: N/A;   KYPHOPLASTY N/A 05/30/2021   Procedure: THORACIC FIVE KYPHOPLASTY;  Surgeon: Coletta Memos, MD;  Location: [facility] OR;  Service: Neurosurgery;  Laterality: N/A;  THORACIC FIVE KYPHOPLASTY   LEFT HEART CATH AND CORONARY ANGIOGRAPHY N/A 09/30/2022   Procedure: LEFT HEART CATH AND CORONARY ANGIOGRAPHY;  Surgeon: Corky Crafts, MD;  Location: [facility] INVASIVE CV LAB;  Service: Cardiovascular;  Laterality: N/A;   PENILE PROSTHESIS IMPLANT  1983    FAMILY HISTORY: Family History  Problem Relation Age of Onset   Heart failure Mother    Cancer - Other Mother        breast   Heart disease Father    Cancer - Other Sister        tongue cancer   Heart attack Sister     SOCIAL HISTORY: Social History   Socioeconomic History   Marital status: Married    Spouse name: Talbert Forest   Number of children: 0   Years of education: 14   Highest education level: Not on file  Occupational History    Comment: [employer]  Tobacco Use   Smoking status: Never    Passive exposure: Never   Smokeless tobacco: Never  Vaping Use   Vaping Use: Never used  Substance and Sexual Activity   Alcohol use: No   Drug use: No   Sexual activity: Yes   Other Topics Concern   Not on file  Social History Narrative   Lives with wife   Caffeine- Diet Coke 5 daily   Social Determinants of Health   Financial Resource Strain: Not on file  Food Insecurity: No Food Insecurity (09/29/2022)   Hunger Vital Sign    Worried About Running Out of Food in the Last Year: Never true    Ran Out of Food in the Last Year: Never true  Transportation Needs: No Transportation Needs (09/29/2022)   PRAPARE - Administrator, Civil Service (Medical): No    Lack of Transportation (Non-Medical): No  Physical Activity: Not on file  Stress: Not on file  Social Connections: Not on file  Intimate Partner Violence: Not At Risk (09/29/2022)   Humiliation, Afraid, Rape, and Kick questionnaire    Fear of Current or Ex-Partner: No    Emotionally Abused: No    Physically Abused: No    Sexually Abused: No    PHYSICAL EXAM  There were no vitals filed for this visit. There is no height or weight on file to calculate BMI.  Generalized: Well developed, in no acute distress  Neurological examination  Mentation: Alert oriented to time, place, history taking. Follows all commands speech and language fluent Cranial nerve II-XII: Pupils were equal round reactive to light. Extraocular movements were full, visual field were full on confrontational test. Facial sensation and strength were normal. Uvula tongue midline. Head turning and shoulder shrug  were normal and symmetric. Motor: The motor testing reveals 5 over 5 strength of all 4 extremities. Good symmetric motor tone is noted throughout.  Sensory: Sensory testing is intact to soft touch on all 4 extremities. No evidence of extinction is noted.  Coordination: Cerebellar testing reveals good finger-nose-finger and heel-to-shin bilaterally.  Gait and station: Gait is normal. Tandem gait is normal. Romberg is negative. No drift is seen.  Reflexes: Deep tendon reflexes are symmetric and normal bilaterally.    DIAGNOSTIC DATA (LABS, IMAGING, TESTING) - I reviewed patient records, labs, notes, testing and imaging myself where available.  Lab Results  Component Value Date   WBC 10.8 (H) 11/13/2022   HGB 14.2 11/13/2022   HCT 41.1 11/13/2022   MCV 91.5 11/13/2022   PLT 341 11/13/2022      Component Value Date/Time   NA 134 (L) 11/13/2022 1513   K 3.8 11/13/2022 1513   CL 101 11/13/2022 1513   CO2 23 11/13/2022 1513   GLUCOSE 139 (H) 11/13/2022 1513   BUN 15 11/13/2022 1513   CREATININE 0.75 11/13/2022 1513   CREATININE 0.91 09/26/2022 0939   CALCIUM 8.9 11/13/2022 1513   PROT 6.8 11/13/2022 1513   ALBUMIN 3.2 (L) 11/13/2022 1513   AST 22 11/13/2022 1513   ALT 23 11/13/2022 1513   ALKPHOS 82 11/13/2022 1513   BILITOT 0.5 11/13/2022 1513   GFRNONAA >60 11/13/2022 1513   GFRAA >60 07/05/2019 0537   Lab Results  Component Value Date   CHOL 163 10/03/2022   HDL 24 (L) 10/03/2022   LDLCALC 111 (H) 10/03/2022   TRIG 141 10/03/2022   CHOLHDL 6.8 10/03/2022   Lab Results  Component Value Date   HGBA1C 6.7 (H) 09/29/2022   Lab Results  Component Value Date   VITAMINB12 353 11/25/2021   Lab Results  Component Value Date   TSH 0.130 (L) 09/29/2022    Margie Ege, AGNP-C, DNP 11/19/2022, 5:25 AM [facility] [address], [address] [place], [place] [address] [phone]

## 2022-11-21 ENCOUNTER — Telehealth: Payer: Self-pay

## 2022-11-21 DIAGNOSIS — R2681 Unsteadiness on feet: Secondary | ICD-10-CM | POA: Diagnosis not present

## 2022-11-21 DIAGNOSIS — K5289 Other specified noninfective gastroenteritis and colitis: Secondary | ICD-10-CM | POA: Diagnosis not present

## 2022-11-21 DIAGNOSIS — M6281 Muscle weakness (generalized): Secondary | ICD-10-CM | POA: Diagnosis not present

## 2022-11-21 DIAGNOSIS — R2689 Other abnormalities of gait and mobility: Secondary | ICD-10-CM | POA: Diagnosis not present

## 2022-11-21 NOTE — Telephone Encounter (Signed)
Transition Care Management Follow-up Telephone Call Date of discharge and from where: [facility] 6/8 How have you been since you were released from the hospital? Doing ok Lives in a SNF/ REHAB Any questions or concerns? No  Items Reviewed: Did the pt receive and understand the discharge instructions provided? Yes  Medications obtained and verified? Yes  Other? No  Any new allergies since your discharge? No  Dietary orders reviewed? No Do you have support at home? Yes  SNF    Follow up appointments reviewed:  PCP Hospital f/u appt confirmed? Yes  Scheduled to see  on  @ . Specialist Hospital f/u appt confirmed?  Scheduled to see  on  @ . Are transportation arrangements needed? No  If their condition worsens, is the pt aware to call PCP or go to the Emergency Dept.? Yes Was the patient provided with contact information for the PCP's office or ED? Yes Was to pt encouraged to call back with questions or concerns? Yes

## 2022-11-22 DIAGNOSIS — F338 Other recurrent depressive disorders: Secondary | ICD-10-CM | POA: Diagnosis not present

## 2022-11-22 DIAGNOSIS — G4700 Insomnia, unspecified: Secondary | ICD-10-CM | POA: Diagnosis not present

## 2022-11-25 DIAGNOSIS — R2689 Other abnormalities of gait and mobility: Secondary | ICD-10-CM | POA: Diagnosis not present

## 2022-11-25 DIAGNOSIS — M6281 Muscle weakness (generalized): Secondary | ICD-10-CM | POA: Diagnosis not present

## 2022-11-25 DIAGNOSIS — R2681 Unsteadiness on feet: Secondary | ICD-10-CM | POA: Diagnosis not present

## 2022-11-25 DIAGNOSIS — E1165 Type 2 diabetes mellitus with hyperglycemia: Secondary | ICD-10-CM | POA: Diagnosis not present

## 2022-11-25 DIAGNOSIS — K5289 Other specified noninfective gastroenteritis and colitis: Secondary | ICD-10-CM | POA: Diagnosis not present

## 2022-11-25 DIAGNOSIS — F338 Other recurrent depressive disorders: Secondary | ICD-10-CM | POA: Diagnosis not present

## 2022-11-25 DIAGNOSIS — I25110 Atherosclerotic heart disease of native coronary artery with unstable angina pectoris: Secondary | ICD-10-CM | POA: Diagnosis not present

## 2022-11-25 DIAGNOSIS — I10 Essential (primary) hypertension: Secondary | ICD-10-CM | POA: Diagnosis not present

## 2022-11-26 DIAGNOSIS — E1165 Type 2 diabetes mellitus with hyperglycemia: Secondary | ICD-10-CM | POA: Diagnosis not present

## 2022-11-26 DIAGNOSIS — I10 Essential (primary) hypertension: Secondary | ICD-10-CM | POA: Diagnosis not present

## 2022-11-26 DIAGNOSIS — F338 Other recurrent depressive disorders: Secondary | ICD-10-CM | POA: Diagnosis not present

## 2022-11-26 DIAGNOSIS — I25110 Atherosclerotic heart disease of native coronary artery with unstable angina pectoris: Secondary | ICD-10-CM | POA: Diagnosis not present

## 2022-11-26 NOTE — Progress Notes (Deleted)
Office Visit Note  Patient: William Hunter             Date of Birth: 04-13-1946           MRN: [id number]             PCP: Irena Reichmann, DO Referring: Irena Reichmann, DO Visit Date: 11/27/2022   Subjective:  No chief complaint on file.   History of Present Illness: [name] is a 77 y.o. male here for follow up ***   Previous HPI 09/26/22 William Hunter is a 77 y.o. male here for seronegative rheumatoid arthritis on humira and plaquenil. Previously was also on methotrexate but out of medication due to too many months since last follow up visit with his previous office.  Prior to establishing care there in 2022 he had seen rheumatology in [place] for longstanding disease over many years.  He has been doing particularly poorly since last year had significant worsening with lower extremity decreased mobility and increased frequency of falls.  He has known severe bilateral knee osteoarthritis was not thought to be a good surgical candidate due to cardiovascular comorbidities.  Has been treated with repeated injections both steroids and viscosupplementation.  Neurology assessment in August findings thought multifactorial with deconditioning, uncontrolled arthritis, and longstanding poorly controlled diabetes with peripheral neuropathy.  He is on Eliquis anticoagulation for the incidentally found venous thrombosis. Recently he is still doing poorly he is having falls every few weeks with extensive bruising.  In the past 1 week has weakness has been worse than usual also having increased cough and shortness of breath.  He thinks this is more related to seasonal allergic rhinitis and drainage.   Previous DMARDs SSZ Prednisone - intolerance   11/2021 MyoMarker 3 panel neg   08/2020 RF neg CCP neg HBV/HCV neg SPEP wnl   No Rheumatology ROS completed.   PMFS History:  Patient Active Problem List   Diagnosis Date Noted   Elevated troponin 09/30/2022   Chest pain 09/29/2022   High  risk medication use 09/26/2022   Neuropathy 04/10/2022   Abnormal finding on MRI of brain 04/10/2022   Gait abnormality 01/31/2022   Aortic atherosclerosis ([name] 12/09/2021   History of pulmonary embolism 12/09/2021   Pure hypercholesterolemia 12/09/2021   Bilateral leg weakness 11/25/2021   Leg weakness, bilateral 11/25/2021   Lower extremity weakness 11/25/2021   Statin myopathy 02/22/2020   Acute respiratory failure with hypoxia ([name] 07/03/2019   Acute respiratory failure due to COVID-19 [facility]) 07/03/2019   Migraine with aura and without status migrainosus, not intractable 09/02/2016   Diabetic polyneuropathy associated with type 2 diabetes mellitus ([name] 09/02/2016   Rheumatoid arthritis ([name] 09/02/2016   Coronary vasospasm ([name] 04/06/2014   Gallstones 03/10/2012   Abdominal pain 03/09/2012   NSTEMI (non-ST elevation myocardial infarction) [facility])    Coronary artery disease    Hypertension    Dyslipidemia     Past Medical History:  Diagnosis Date   Anginal pain ([name]    Arthritis    "fingers" (03/09/2012)   Cold feet    "from my diabetes; they stay cold" (03/09/2012)   Coronary artery disease    Coronary artery spasm [facility])    since age 78 Dr Aleen Campi   COVID 07/06/2019   hospitalizied   Dyslipidemia    Femoral DVT (deep venous thrombosis) ([name]    left   Headache(784.0)    Hx of gallstones    Dr Derrell Lolling lap cholecystectomy   Hx of  plastic surgery    plastic surgery following a fall off a loading dock ,lost  all his upper teeth    Hypertension    MI, old    at age 65   Migraines    Pneumonia ~ 1962; 1970's   "double"; "regular" (03/09/2012)   Rheumatoid arthritis ([name]    Dr Dierdre Forth    Slipped cervical disc 06/10/2006   Dr Patsi Sears in the past/ Dr Larita Fife , chiropractor in 2008   Thyroid disease    Dr Lucianne Muss   Type II diabetes mellitus ([name]     Family History  Problem Relation Age of Onset   Heart failure Mother    Cancer - Other Mother        breast    Heart disease Father    Cancer - Other Sister        tongue cancer   Heart attack Sister    Past Surgical History:  Procedure Laterality Date   CARDIAC CATHETERIZATION     x 3 with NO stents    CHOLECYSTECTOMY  03/11/2012   Procedure: LAPAROSCOPIC CHOLECYSTECTOMY WITH INTRAOPERATIVE CHOLANGIOGRAM;  Surgeon: Ernestene Mention, MD;  Location: [facility] OR;  Service: General;  Laterality: N/A;   CORONARY STENT INTERVENTION N/A 10/02/2022   Procedure: CORONARY STENT INTERVENTION;  Surgeon: Corky Crafts, MD;  Location: [facility] INVASIVE CV LAB;  Service: Cardiovascular;  Laterality: N/A;   CORONARY ULTRASOUND/IVUS N/A 10/02/2022   Procedure: Coronary Ultrasound/IVUS;  Surgeon: Corky Crafts, MD;  Location: [facility] INVASIVE CV LAB;  Service: Cardiovascular;  Laterality: N/A;   KYPHOPLASTY N/A 05/30/2021   Procedure: THORACIC FIVE KYPHOPLASTY;  Surgeon: Coletta Memos, MD;  Location: [facility] OR;  Service: Neurosurgery;  Laterality: N/A;  THORACIC FIVE KYPHOPLASTY   LEFT HEART CATH AND CORONARY ANGIOGRAPHY N/A 09/30/2022   Procedure: LEFT HEART CATH AND CORONARY ANGIOGRAPHY;  Surgeon: Corky Crafts, MD;  Location: [facility] INVASIVE CV LAB;  Service: Cardiovascular;  Laterality: N/A;   PENILE PROSTHESIS IMPLANT  1983   Social History   Social History Narrative   Lives with wife   Caffeine- Diet Coke 5 daily   Immunization History  Administered Date(s) Administered   PFIZER(Purple Top)SARS-COV-2 Vaccination 09/16/2019, 10/11/2019     Objective: Vital Signs: There were no vitals taken for this visit.   Physical Exam   Musculoskeletal Exam: ***  CDAI Exam: CDAI Score: -- Patient Global: --; Provider Global: -- Swollen: --; Tender: -- Joint Exam 11/27/2022   No joint exam has been documented for this visit   There is currently no information documented on the homunculus. Go to the Rheumatology activity and complete the homunculus joint exam.  Investigation: No additional  findings.  Imaging: DG Chest 2 View  Result Date: 11/13/2022 CLINICAL DATA:  Chest pain, shortness of breath. EXAM: CHEST - 2 VIEW COMPARISON:  Nov 08, 2022. FINDINGS: The heart size and mediastinal contours are within normal limits. Both lungs are clear. Status post midthoracic kyphoplasty. IMPRESSION: No active cardiopulmonary disease. Electronically Signed   By: Lupita Raider M.D.   On: 11/13/2022 16:00   DG Chest Port 1 View  Result Date: 11/08/2022 CLINICAL DATA:  Chest pain. EXAM: PORTABLE CHEST 1 VIEW COMPARISON:  09/29/2022. FINDINGS: Rotated patient with kyphotic view. Within this limitation, no consolidation or pulmonary edema. Stable cardiac and mediastinal contours. No pleural effusion or pneumothorax. Gaseous distention of the bowel within the upper abdomen. IMPRESSION: 1.  No evidence of acute cardiopulmonary disease. 2. Gaseous distention of the bowel within  the upper abdomen. Electronically Signed   By: Orvan Falconer M.D.   On: 11/08/2022 12:33    Recent Labs: Lab Results  Component Value Date   WBC 10.8 (H) 11/13/2022   HGB 14.2 11/13/2022   PLT 341 11/13/2022   NA 134 (L) 11/13/2022   K 3.8 11/13/2022   CL 101 11/13/2022   CO2 23 11/13/2022   GLUCOSE 139 (H) 11/13/2022   BUN 15 11/13/2022   CREATININE 0.75 11/13/2022   BILITOT 0.5 11/13/2022   ALKPHOS 82 11/13/2022   AST 22 11/13/2022   ALT 23 11/13/2022   PROT 6.8 11/13/2022   ALBUMIN 3.2 (L) 11/13/2022   CALCIUM 8.9 11/13/2022   GFRAA >60 07/05/2019   QFTBGOLDPLUS NEGATIVE 09/26/2022    Speciality Comments: No specialty comments available.  Procedures:  No procedures performed Allergies: Codeine, Penicillins, Prednisone, Wool alcohol [lanolin], Empagliflozin, Statins, and Penicillin g   Assessment / Plan:     Visit Diagnoses: No diagnosis found.  ***  Orders: No orders of the defined types were placed in this encounter.  No orders of the defined types were placed in this  encounter.    Follow-Up Instructions: No follow-ups on file.   Fuller Plan, MD  Note - This record has been created using [employer].  Chart creation errors have been sought, but may not always  have been located. Such creation errors do not reflect on  the standard of medical care.

## 2022-11-27 ENCOUNTER — Ambulatory Visit: Payer: Medicare HMO | Admitting: Internal Medicine

## 2022-11-28 DIAGNOSIS — R2681 Unsteadiness on feet: Secondary | ICD-10-CM | POA: Diagnosis not present

## 2022-11-28 DIAGNOSIS — K5289 Other specified noninfective gastroenteritis and colitis: Secondary | ICD-10-CM | POA: Diagnosis not present

## 2022-11-28 DIAGNOSIS — M6281 Muscle weakness (generalized): Secondary | ICD-10-CM | POA: Diagnosis not present

## 2022-11-28 DIAGNOSIS — R2689 Other abnormalities of gait and mobility: Secondary | ICD-10-CM | POA: Diagnosis not present

## 2022-12-02 DIAGNOSIS — R2689 Other abnormalities of gait and mobility: Secondary | ICD-10-CM | POA: Diagnosis not present

## 2022-12-02 DIAGNOSIS — F338 Other recurrent depressive disorders: Secondary | ICD-10-CM | POA: Diagnosis not present

## 2022-12-02 DIAGNOSIS — I10 Essential (primary) hypertension: Secondary | ICD-10-CM | POA: Diagnosis not present

## 2022-12-02 DIAGNOSIS — R2681 Unsteadiness on feet: Secondary | ICD-10-CM | POA: Diagnosis not present

## 2022-12-02 DIAGNOSIS — K5289 Other specified noninfective gastroenteritis and colitis: Secondary | ICD-10-CM | POA: Diagnosis not present

## 2022-12-02 DIAGNOSIS — I25110 Atherosclerotic heart disease of native coronary artery with unstable angina pectoris: Secondary | ICD-10-CM | POA: Diagnosis not present

## 2022-12-02 DIAGNOSIS — M6281 Muscle weakness (generalized): Secondary | ICD-10-CM | POA: Diagnosis not present

## 2022-12-05 DIAGNOSIS — R2681 Unsteadiness on feet: Secondary | ICD-10-CM | POA: Diagnosis not present

## 2022-12-05 DIAGNOSIS — M6281 Muscle weakness (generalized): Secondary | ICD-10-CM | POA: Diagnosis not present

## 2022-12-05 DIAGNOSIS — R2689 Other abnormalities of gait and mobility: Secondary | ICD-10-CM | POA: Diagnosis not present

## 2022-12-05 DIAGNOSIS — K5289 Other specified noninfective gastroenteritis and colitis: Secondary | ICD-10-CM | POA: Diagnosis not present

## 2022-12-08 ENCOUNTER — Other Ambulatory Visit: Payer: Self-pay

## 2022-12-08 ENCOUNTER — Emergency Department (HOSPITAL_COMMUNITY): Payer: Medicare HMO

## 2022-12-08 ENCOUNTER — Emergency Department (HOSPITAL_COMMUNITY)
Admission: EM | Admit: 2022-12-08 | Discharge: 2022-12-09 | Disposition: A | Discharge status: Home / Self Care | Payer: Medicare HMO | Attending: Emergency Medicine | Admitting: Emergency Medicine

## 2022-12-08 ENCOUNTER — Encounter (HOSPITAL_COMMUNITY): Payer: Self-pay

## 2022-12-08 DIAGNOSIS — R079 Chest pain, unspecified: Secondary | ICD-10-CM | POA: Diagnosis not present

## 2022-12-08 DIAGNOSIS — I10 Essential (primary) hypertension: Secondary | ICD-10-CM | POA: Insufficient documentation

## 2022-12-08 DIAGNOSIS — I2510 Atherosclerotic heart disease of native coronary artery without angina pectoris: Secondary | ICD-10-CM | POA: Insufficient documentation

## 2022-12-08 DIAGNOSIS — Z7901 Long term (current) use of anticoagulants: Secondary | ICD-10-CM | POA: Diagnosis not present

## 2022-12-08 DIAGNOSIS — Z794 Long term (current) use of insulin: Secondary | ICD-10-CM | POA: Insufficient documentation

## 2022-12-08 DIAGNOSIS — Z7902 Long term (current) use of antithrombotics/antiplatelets: Secondary | ICD-10-CM | POA: Diagnosis not present

## 2022-12-08 DIAGNOSIS — Z79899 Other long term (current) drug therapy: Secondary | ICD-10-CM | POA: Insufficient documentation

## 2022-12-08 DIAGNOSIS — R0789 Other chest pain: Secondary | ICD-10-CM | POA: Diagnosis not present

## 2022-12-08 LAB — CBC WITH DIFFERENTIAL/PLATELET
Abs Immature Granulocytes: 0.03 10*3/uL (ref 0.00–0.07)
Basophils Absolute: 0.1 10*3/uL (ref 0.0–0.1)
Basophils Relative: 1 %
Eosinophils Absolute: 0.3 10*3/uL (ref 0.0–0.5)
Eosinophils Relative: 4 %
HCT: 36.6 % — ABNORMAL LOW (ref 39.0–52.0)
Hemoglobin: 12.1 g/dL — ABNORMAL LOW (ref 13.0–17.0)
Immature Granulocytes: 0 %
Lymphocytes Relative: 25 %
Lymphs Abs: 2.2 10*3/uL (ref 0.7–4.0)
MCH: 29.8 pg (ref 26.0–34.0)
MCHC: 33.1 g/dL (ref 30.0–36.0)
MCV: 90.1 fL (ref 80.0–100.0)
Monocytes Absolute: 0.7 10*3/uL (ref 0.1–1.0)
Monocytes Relative: 9 %
Neutro Abs: 5.3 10*3/uL (ref 1.7–7.7)
Neutrophils Relative %: 61 %
Platelets: 340 10*3/uL (ref 150–400)
RBC: 4.06 MIL/uL — ABNORMAL LOW (ref 4.22–5.81)
RDW: 14.2 % (ref 11.5–15.5)
WBC: 8.7 10*3/uL (ref 4.0–10.5)
nRBC: 0 % (ref 0.0–0.2)

## 2022-12-08 LAB — COMPREHENSIVE METABOLIC PANEL
ALT: 7 U/L (ref 0–44)
AST: 19 U/L (ref 15–41)
Albumin: 3 g/dL — ABNORMAL LOW (ref 3.5–5.0)
Alkaline Phosphatase: 89 U/L (ref 38–126)
Anion gap: 9 (ref 5–15)
BUN: 18 mg/dL (ref 8–23)
CO2: 22 mmol/L (ref 22–32)
Calcium: 8.6 mg/dL — ABNORMAL LOW (ref 8.9–10.3)
Chloride: 104 mmol/L (ref 98–111)
Creatinine, Ser: 0.79 mg/dL (ref 0.61–1.24)
GFR, Estimated: >60 mL/min (ref >60)
Glucose, Bld: 162 mg/dL — ABNORMAL HIGH (ref 70–99)
Potassium: 4 mmol/L (ref 3.5–5.1)
Sodium: 135 mmol/L (ref 135–145)
Total Bilirubin: 0.5 mg/dL (ref 0.3–1.2)
Total Protein: 6.5 g/dL (ref 6.5–8.1)

## 2022-12-08 LAB — TROPONIN I (HIGH SENSITIVITY): Troponin I (High Sensitivity): 5 ng/L (ref <18)

## 2022-12-08 MED ORDER — ACETAMINOPHEN 500 MG PO TABS
1000.0000 mg | ORAL_TABLET | Freq: Once | ORAL | Status: AC
  Administered 2022-12-08: 1000 mg via ORAL
  Filled 2022-12-08: qty 2

## 2022-12-08 NOTE — ED Notes (Signed)
[facility] called, no eta 

## 2022-12-08 NOTE — Discharge Instructions (Addendum)
If you continue to have pain you need to follow up with cardiology and your regular doctor.  No signs of heart attack today.

## 2022-12-08 NOTE — ED Provider Notes (Signed)
[place] EMERGENCY DEPARTMENT AT [facility] Provider Note   CSN: [id number] Arrival date & time: 12/08/22  1809     History  Chief Complaint  Patient presents with   Chest Pain    William Hunter is a 77 y.o. male.  Patient is a 77 year old male with a history of hypertension, CAD status post NSTEMI in April requiring a stent to the LAD but otherwise nonobstructive disease, diabetes, thyroid disease, RA and prior DVT who is returning from his nursing home today due to complaint of left-sided chest pain.  William Hunter reports it started around 1:00 today and is just an uncomfortable pain in the left side of his chest.  It does not radiate does not cause any shortness of breath.  William Hunter denies any cough or pleuritic pain.  It did not start after eating.  William Hunter denies nausea vomiting.  This it seems like similar pain William Hunter has been seen with this month and in May.  Of note patient also has recently been sent to a nursing home because his wife passed away and she was the primary caregiver.  The history is provided by the patient.  Chest Pain      Home Medications Prior to Admission medications   Medication Sig Start Date End Date Taking? Authorizing Provider  amLODipine (NORVASC) 5 MG tablet Take 1 tablet (5 mg total) by mouth daily. 10/04/22   Lanae Boast, MD  apixaban (ELIQUIS) 5 MG TABS tablet Take 1 tablet (5 mg total) by mouth 2 (two) times daily. 10/03/22   Lanae Boast, MD  BD PEN NEEDLE MICRO U/F 32G X 6 MM MISC Inject into the skin 3 (three) times daily. 09/16/22   [provider]  BD PEN NEEDLE NANO 2ND GEN 32G X 4 MM MISC 1 each by Other route as directed. 06/07/22   [provider]  carbidopa-levodopa (SINEMET IR) 25-100 MG tablet Take 1 tablet by mouth 3 (three) times daily. 04/10/22   Levert Feinstein, MD  clopidogrel (PLAVIX) 75 MG tablet Take 1 tablet (75 mg total) by mouth daily with breakfast. 10/04/22   Lanae Boast, MD  Continuous Glucose Sensor (FREESTYLE LIBRE 2 SENSOR)  MISC Place onto the skin. 09/18/22   [provider]  diltiazem (TIADYLT ER) 300 MG 24 hr capsule Take 300 mg by mouth daily.    [provider]  famotidine (PEPCID) 20 MG tablet Take 1 tablet (20 mg total) by mouth daily. 11/13/22   Rexford Maus, DO  folic acid (FOLVITE) 1 MG tablet Take 1 tablet (1 mg total) by mouth daily. 09/26/22   Rice, Jamesetta Orleans, MD  GVOKE HYPOPEN 2-PACK 1 MG/0.2ML SOAJ Inject 1 mg into the skin as needed (hypoglycemia). 04/11/22   [provider]  HUMIRA, 2 PEN, 40 MG/0.4ML PNKT Inject 0.4 mLs into the muscle every 14 (fourteen) days. 09/30/22   Fuller Plan, MD  hydroxychloroquine (PLAQUENIL) 200 MG tablet Take 1 tablet (200 mg total) by mouth daily. 09/26/22   Rice, Jamesetta Orleans, MD  insulin aspart (NOVOLOG) 100 UNIT/ML injection 0-5 Units, Subcutaneous, Daily at bedtime: HS scale CBG < 70: implement hypoglycemia measures CBG 70 - 120: 0 units CBG 121 - 150: 0 units CBG 151 - 200: 0 units CBG 201 - 250: 2 units CBG 251 - 300: 3 units CBG 301 - 350: 4 units CBG 351 - 400: 5 units CBG > 400: call MD and obtain STAT lab verification Patient not taking: Reported on 11/14/2022 11/29/21  Ghimire, Werner Lean, MD  insulin aspart (NOVOLOG) 100 UNIT/ML injection 0-15 Units, Subcutaneous, 3 times daily with meals CBG < 70: Implement Hypoglycemia Standing Orders and refer to Hypoglycemia Standing Orders sidebar report CBG 70 - 120: 0 units CBG 121 - 150: 2 units CBG 151 - 200: 3 units CBG 201 - 250: 5 units CBG 251 - 300: 8 units CBG 301 - 350: 11 units CBG 351 - 400: 15 units CBG > 400: call MD and obtain STAT lab verification 11/29/21   Maretta Bees, MD  isosorbide mononitrate (IMDUR) 30 MG 24 hr tablet Take 0.5 tablets (15 mg total) by mouth daily. Patient not taking: Reported on 11/14/2022 10/04/22   Lanae Boast, MD  meclizine (ANTIVERT) 25 MG tablet Take 25 mg by mouth 3 (three) times daily as needed for dizziness.    [provider]   methotrexate (RHEUMATREX) 2.5 MG tablet Take 6 tablets (15 mg total) by mouth once a week. Caution:Chemotherapy. Protect from light. Patient taking differently: Take 15 mg by mouth once a week. Tuesdays. Caution: Chemotherapy. Protect from light. 09/26/22   Rice, Jamesetta Orleans, MD  metoprolol succinate (TOPROL-XL) 25 MG 24 hr tablet Take 1 tablet (25 mg total) by mouth daily. 10/03/22   Lanae Boast, MD  MYRBETRIQ 25 MG TB24 tablet Take 25 mg by mouth daily. 09/12/22   [provider]  nitroGLYCERIN (NITROSTAT) 0.4 MG SL tablet Place 1 tablet (0.4 mg total) under the tongue every 5 (five) minutes x 3 doses as needed for chest pain. 10/03/22   Lanae Boast, MD  ondansetron (ZOFRAN) 4 MG tablet Take 1 tablet (4 mg total) by mouth every 6 (six) hours. 11/13/22   Elayne Snare K, DO  pantoprazole (PROTONIX) 40 MG tablet Take 1 tablet (40 mg total) by mouth daily at 6 (six) AM. 10/03/22   Lanae Boast, MD  Study - ORION 4 - inclisiran 300 mg/1.69mL or placebo SQ injection (PI-Stuckey) Inject 1.5 mLs (300 mg total) into the skin every 6 (six) months. Patient not taking: Reported on 09/26/2022 09/06/21   Herby Abraham, MD  sucralfate (CARAFATE) 1 g tablet Take 1 tablet (1 g total) by mouth 4 (four) times daily -  with meals and at bedtime. 11/08/22   Fayrene Helper, PA-C  TRESIBA FLEXTOUCH 200 UNIT/ML FlexTouch Pen Inject 20 Units into the skin at bedtime. 11/08/22   [provider]      Allergies    Codeine, Penicillins, Prednisone, Wool alcohol [lanolin], Empagliflozin, Statins, and Penicillin g    Review of Systems   Review of Systems  Cardiovascular:  Positive for chest pain.    Physical Exam Updated Vital Signs BP 129/70   Pulse 62   Temp 98.1 F (36.7 C) (Oral)   Resp 16   Ht 5\' 10"  (1.778 m)   Wt 73.1 kg   SpO2 100%   BMI 23.13 kg/m  Physical Exam Vitals and nursing note reviewed.  Constitutional:      General: William Hunter is not in acute distress.    Appearance: William Hunter is  well-developed.  HENT:     Head: Normocephalic and atraumatic.  Eyes:     Conjunctiva/sclera: Conjunctivae normal.     Pupils: Pupils are equal, round, and reactive to light.  Cardiovascular:     Rate and Rhythm: Normal rate and regular rhythm.     Heart sounds: No murmur heard. Pulmonary:     Effort: Pulmonary effort is normal. No respiratory distress.     Breath  sounds: Normal breath sounds. No wheezing or rales.  Abdominal:     General: There is no distension.     Palpations: Abdomen is soft.     Tenderness: There is no abdominal tenderness. There is no guarding or rebound.  Musculoskeletal:        General: No tenderness. Normal range of motion.     Cervical back: Normal range of motion and neck supple.     Right lower leg: No edema.     Left lower leg: No edema.  Skin:    General: Skin is warm and dry.     Findings: No erythema or rash.  Neurological:     Mental Status: William Hunter is alert and oriented to person, place, and time. Mental status is at baseline.  Psychiatric:        Behavior: Behavior normal.     ED Results / Procedures / Treatments   Labs (all labs ordered are listed, but only abnormal results are displayed) Labs Reviewed  CBC WITH DIFFERENTIAL/PLATELET - Abnormal; Notable for the following components:      Result Value   RBC 4.06 (*)    Hemoglobin 12.1 (*)    HCT 36.6 (*)    All other components within normal limits  COMPREHENSIVE METABOLIC PANEL - Abnormal; Notable for the following components:   Glucose, Bld 162 (*)    Calcium 8.6 (*)    Albumin 3.0 (*)    All other components within normal limits  TROPONIN I (HIGH SENSITIVITY)    EKG EKG Interpretation Date/Time:  Sunday December 08 2022 18:11:13 EDT Ventricular Rate:  72 PR Interval:  200 QRS Duration:  88 QT Interval:  382 QTC Calculation: 418 R Axis:   31  Text Interpretation: Sinus rhythm Abnormal R-wave progression, early transition No significant change since last tracing Confirmed by  Gwyneth Sprout ([id number]) on 12/08/2022 6:19:42 PM  Radiology DG Chest Port 1 View  Result Date: 12/08/2022 CLINICAL DATA:  Chest pain EXAM: PORTABLE CHEST 1 VIEW COMPARISON:  11/13/2022 FINDINGS: No acute airspace disease. Stable cardiomediastinal silhouette. No pneumothorax. Post augmentation changes of the midthoracic spine IMPRESSION: No active disease. Electronically Signed   By: Jasmine Pang M.D.   On: 12/08/2022 19:17    Procedures Procedures    Medications Ordered in ED Medications  acetaminophen (TYLENOL) tablet 1,000 mg (1,000 mg Oral Given 12/08/22 1949)    ED Course/ Medical Decision Making/ A&P                             Medical Decision Making Amount and/or Complexity of Data Reviewed Labs: ordered. Decision-making details documented in ED Course. Radiology: ordered and independent interpretation performed. Decision-making details documented in ED Course. ECG/medicine tests: ordered and independent interpretation performed. Decision-making details documented in ED Course.  Risk OTC drugs.   Pt with multiple medical problems and comorbidities and presenting today with a complaint that caries a high risk for morbidity and mortality.  Here today with complaint of chest pain.  Patient does have history of CAD, NSTEMI and stents placed in April but has been here several times with similar chest pain and workup has been normal.  Patient is well-appearing on exam at this time with normal vital signs.  William Hunter has no associated symptoms.  Lower suspicion at this time for PE as patient is on Eliquis due to having a DVT in the past, possibility for ACS versus nonspecific chest pain.  I independently interpreted  patient's labs and EKG.  EKG today without any acute changes.  CBC, CMP and troponin without acute findings.  CBC with mild decrease in hemoglobin but this has been typical in the past as well.  12 from 14.  I have independently visualized and interpreted pt's images today.  Chest  x-ray within normal limits. Lab work and imaging is reassuring.  Patient is hemodynamically stable.  At this time no indication for admission but did recommend outpatient follow-up if William Hunter continues to have symptoms.        Final Clinical Impression(s) / ED Diagnoses Final diagnoses:  Nonspecific chest pain    Rx / DC Orders ED Discharge Orders     None         Gwyneth Sprout, MD 12/08/22 2313

## 2022-12-08 NOTE — ED Triage Notes (Signed)
Pt BIB [facility] from [facility] with CP. Pt states it's sharp and dull and radiates to L arm.   EMS VS HR 70 BP 118/62 O2 98% RA

## 2022-12-09 DIAGNOSIS — R071 Chest pain on breathing: Secondary | ICD-10-CM | POA: Diagnosis not present

## 2022-12-09 DIAGNOSIS — Z7401 Bed confinement status: Secondary | ICD-10-CM | POA: Diagnosis not present

## 2022-12-12 ENCOUNTER — Other Ambulatory Visit: Payer: Self-pay | Admitting: Physician Assistant

## 2022-12-18 ENCOUNTER — Telehealth: Payer: Self-pay | Admitting: Pharmacist

## 2022-12-18 NOTE — Telephone Encounter (Signed)
Received [employer] provider form for Humira. Placed in Dr. Gregary Cromer folder for signature  Chesley Mires, PharmD, MPH, BCPS, CPP Clinical Pharmacist (Rheumatology and Pulmonology)

## 2022-12-19 NOTE — Telephone Encounter (Signed)
Received signed provider form for [employer]. Submitted Patient Assistance Application to [employer] for HUMIRA along with provider portion, PA and med list. Will update patient when we receive a response.  Phone: [phone] Fax: [phone]  Chesley Mires, PharmD, MPH, BCPS, CPP Clinical Pharmacist (Rheumatology and Pulmonology)

## 2022-12-20 DIAGNOSIS — F320 Major depressive disorder, single episode, mild: Secondary | ICD-10-CM | POA: Diagnosis not present

## 2022-12-20 DIAGNOSIS — F411 Generalized anxiety disorder: Secondary | ICD-10-CM | POA: Diagnosis not present

## 2022-12-23 DIAGNOSIS — N390 Urinary tract infection, site not specified: Secondary | ICD-10-CM | POA: Diagnosis not present

## 2022-12-25 DIAGNOSIS — I10 Essential (primary) hypertension: Secondary | ICD-10-CM | POA: Diagnosis not present

## 2022-12-25 DIAGNOSIS — F411 Generalized anxiety disorder: Secondary | ICD-10-CM | POA: Diagnosis not present

## 2022-12-25 DIAGNOSIS — F320 Major depressive disorder, single episode, mild: Secondary | ICD-10-CM | POA: Diagnosis not present

## 2022-12-25 DIAGNOSIS — I25110 Atherosclerotic heart disease of native coronary artery with unstable angina pectoris: Secondary | ICD-10-CM | POA: Diagnosis not present

## 2022-12-25 NOTE — Telephone Encounter (Signed)
Called [employer] to confirm they received new provider portion of PAP application. Confirmed they received it for provider Sheliah Hatch and the patient is approved through 06/10/23.  Phone #: [phone]

## 2022-12-26 DIAGNOSIS — E119 Type 2 diabetes mellitus without complications: Secondary | ICD-10-CM | POA: Diagnosis not present

## 2023-01-06 ENCOUNTER — Ambulatory Visit: Payer: Medicare HMO | Admitting: Physician Assistant

## 2023-01-23 DIAGNOSIS — I25110 Atherosclerotic heart disease of native coronary artery with unstable angina pectoris: Secondary | ICD-10-CM | POA: Diagnosis not present

## 2023-01-23 DIAGNOSIS — E1165 Type 2 diabetes mellitus with hyperglycemia: Secondary | ICD-10-CM | POA: Diagnosis not present

## 2023-01-23 DIAGNOSIS — I10 Essential (primary) hypertension: Secondary | ICD-10-CM | POA: Diagnosis not present

## 2023-01-23 DIAGNOSIS — F338 Other recurrent depressive disorders: Secondary | ICD-10-CM | POA: Diagnosis not present

## 2023-02-04 DIAGNOSIS — I10 Essential (primary) hypertension: Secondary | ICD-10-CM | POA: Diagnosis not present

## 2023-02-04 DIAGNOSIS — R635 Abnormal weight gain: Secondary | ICD-10-CM | POA: Diagnosis not present

## 2023-02-04 DIAGNOSIS — I25110 Atherosclerotic heart disease of native coronary artery with unstable angina pectoris: Secondary | ICD-10-CM | POA: Diagnosis not present

## 2023-02-04 DIAGNOSIS — E1165 Type 2 diabetes mellitus with hyperglycemia: Secondary | ICD-10-CM | POA: Diagnosis not present

## 2023-02-06 DIAGNOSIS — R41841 Cognitive communication deficit: Secondary | ICD-10-CM | POA: Diagnosis not present

## 2023-02-06 DIAGNOSIS — I214 Non-ST elevation (NSTEMI) myocardial infarction: Secondary | ICD-10-CM | POA: Diagnosis not present

## 2023-02-06 DIAGNOSIS — R2689 Other abnormalities of gait and mobility: Secondary | ICD-10-CM | POA: Diagnosis not present

## 2023-02-06 DIAGNOSIS — M6281 Muscle weakness (generalized): Secondary | ICD-10-CM | POA: Diagnosis not present

## 2023-02-06 DIAGNOSIS — R2681 Unsteadiness on feet: Secondary | ICD-10-CM | POA: Diagnosis not present

## 2023-02-06 DIAGNOSIS — E785 Hyperlipidemia, unspecified: Secondary | ICD-10-CM | POA: Diagnosis not present

## 2023-02-07 DIAGNOSIS — I214 Non-ST elevation (NSTEMI) myocardial infarction: Secondary | ICD-10-CM | POA: Diagnosis not present

## 2023-02-07 DIAGNOSIS — R2681 Unsteadiness on feet: Secondary | ICD-10-CM | POA: Diagnosis not present

## 2023-02-07 DIAGNOSIS — R41841 Cognitive communication deficit: Secondary | ICD-10-CM | POA: Diagnosis not present

## 2023-02-07 DIAGNOSIS — E785 Hyperlipidemia, unspecified: Secondary | ICD-10-CM | POA: Diagnosis not present

## 2023-02-07 DIAGNOSIS — R2689 Other abnormalities of gait and mobility: Secondary | ICD-10-CM | POA: Diagnosis not present

## 2023-02-07 DIAGNOSIS — M6281 Muscle weakness (generalized): Secondary | ICD-10-CM | POA: Diagnosis not present

## 2023-02-08 DIAGNOSIS — E785 Hyperlipidemia, unspecified: Secondary | ICD-10-CM | POA: Diagnosis not present

## 2023-02-08 DIAGNOSIS — R2689 Other abnormalities of gait and mobility: Secondary | ICD-10-CM | POA: Diagnosis not present

## 2023-02-08 DIAGNOSIS — R2681 Unsteadiness on feet: Secondary | ICD-10-CM | POA: Diagnosis not present

## 2023-02-08 DIAGNOSIS — R41841 Cognitive communication deficit: Secondary | ICD-10-CM | POA: Diagnosis not present

## 2023-02-08 DIAGNOSIS — I214 Non-ST elevation (NSTEMI) myocardial infarction: Secondary | ICD-10-CM | POA: Diagnosis not present

## 2023-02-08 DIAGNOSIS — M6281 Muscle weakness (generalized): Secondary | ICD-10-CM | POA: Diagnosis not present

## 2023-02-10 DIAGNOSIS — M6281 Muscle weakness (generalized): Secondary | ICD-10-CM | POA: Diagnosis not present

## 2023-02-10 DIAGNOSIS — E785 Hyperlipidemia, unspecified: Secondary | ICD-10-CM | POA: Diagnosis not present

## 2023-02-10 DIAGNOSIS — R2681 Unsteadiness on feet: Secondary | ICD-10-CM | POA: Diagnosis not present

## 2023-02-10 DIAGNOSIS — R2689 Other abnormalities of gait and mobility: Secondary | ICD-10-CM | POA: Diagnosis not present

## 2023-02-10 DIAGNOSIS — I214 Non-ST elevation (NSTEMI) myocardial infarction: Secondary | ICD-10-CM | POA: Diagnosis not present

## 2023-02-11 DIAGNOSIS — M6281 Muscle weakness (generalized): Secondary | ICD-10-CM | POA: Diagnosis not present

## 2023-02-11 DIAGNOSIS — R2689 Other abnormalities of gait and mobility: Secondary | ICD-10-CM | POA: Diagnosis not present

## 2023-02-11 DIAGNOSIS — I214 Non-ST elevation (NSTEMI) myocardial infarction: Secondary | ICD-10-CM | POA: Diagnosis not present

## 2023-02-11 DIAGNOSIS — R2681 Unsteadiness on feet: Secondary | ICD-10-CM | POA: Diagnosis not present

## 2023-02-11 DIAGNOSIS — E785 Hyperlipidemia, unspecified: Secondary | ICD-10-CM | POA: Diagnosis not present

## 2023-02-12 DIAGNOSIS — I214 Non-ST elevation (NSTEMI) myocardial infarction: Secondary | ICD-10-CM | POA: Diagnosis not present

## 2023-02-12 DIAGNOSIS — M6281 Muscle weakness (generalized): Secondary | ICD-10-CM | POA: Diagnosis not present

## 2023-02-12 DIAGNOSIS — R2681 Unsteadiness on feet: Secondary | ICD-10-CM | POA: Diagnosis not present

## 2023-02-12 DIAGNOSIS — R2689 Other abnormalities of gait and mobility: Secondary | ICD-10-CM | POA: Diagnosis not present

## 2023-02-12 DIAGNOSIS — E785 Hyperlipidemia, unspecified: Secondary | ICD-10-CM | POA: Diagnosis not present

## 2023-02-13 ENCOUNTER — Emergency Department (HOSPITAL_COMMUNITY)
Admission: EM | Admit: 2023-02-13 | Discharge: 2023-02-14 | Disposition: A | Discharge status: Home / Self Care | Payer: Medicare HMO | Attending: Emergency Medicine | Admitting: Emergency Medicine

## 2023-02-13 ENCOUNTER — Encounter (HOSPITAL_COMMUNITY): Payer: Self-pay

## 2023-02-13 ENCOUNTER — Emergency Department (HOSPITAL_COMMUNITY): Payer: Medicare HMO

## 2023-02-13 DIAGNOSIS — R0789 Other chest pain: Secondary | ICD-10-CM | POA: Insufficient documentation

## 2023-02-13 DIAGNOSIS — R2681 Unsteadiness on feet: Secondary | ICD-10-CM | POA: Diagnosis not present

## 2023-02-13 DIAGNOSIS — Z794 Long term (current) use of insulin: Secondary | ICD-10-CM | POA: Diagnosis not present

## 2023-02-13 DIAGNOSIS — Z7901 Long term (current) use of anticoagulants: Secondary | ICD-10-CM | POA: Diagnosis not present

## 2023-02-13 DIAGNOSIS — R231 Pallor: Secondary | ICD-10-CM | POA: Diagnosis not present

## 2023-02-13 DIAGNOSIS — E785 Hyperlipidemia, unspecified: Secondary | ICD-10-CM | POA: Diagnosis not present

## 2023-02-13 DIAGNOSIS — R079 Chest pain, unspecified: Secondary | ICD-10-CM | POA: Diagnosis not present

## 2023-02-13 DIAGNOSIS — M6281 Muscle weakness (generalized): Secondary | ICD-10-CM | POA: Diagnosis not present

## 2023-02-13 DIAGNOSIS — I959 Hypotension, unspecified: Secondary | ICD-10-CM | POA: Diagnosis not present

## 2023-02-13 DIAGNOSIS — I214 Non-ST elevation (NSTEMI) myocardial infarction: Secondary | ICD-10-CM | POA: Diagnosis not present

## 2023-02-13 DIAGNOSIS — R2689 Other abnormalities of gait and mobility: Secondary | ICD-10-CM | POA: Diagnosis not present

## 2023-02-13 LAB — BASIC METABOLIC PANEL
Anion gap: 15 (ref 5–15)
BUN: 22 mg/dL (ref 8–23)
CO2: 20 mmol/L — ABNORMAL LOW (ref 22–32)
Calcium: 8.5 mg/dL — ABNORMAL LOW (ref 8.9–10.3)
Chloride: 102 mmol/L (ref 98–111)
Creatinine, Ser: 0.8 mg/dL (ref 0.61–1.24)
GFR, Estimated: >60 mL/min (ref >60)
Glucose, Bld: 146 mg/dL — ABNORMAL HIGH (ref 70–99)
Potassium: 3.7 mmol/L (ref 3.5–5.1)
Sodium: 137 mmol/L (ref 135–145)

## 2023-02-13 LAB — CBC
HCT: 36 % — ABNORMAL LOW (ref 39.0–52.0)
Hemoglobin: 11.9 g/dL — ABNORMAL LOW (ref 13.0–17.0)
MCH: 30.4 pg (ref 26.0–34.0)
MCHC: 33.1 g/dL (ref 30.0–36.0)
MCV: 92.1 fL (ref 80.0–100.0)
Platelets: 342 10*3/uL (ref 150–400)
RBC: 3.91 MIL/uL — ABNORMAL LOW (ref 4.22–5.81)
RDW: 14.2 % (ref 11.5–15.5)
WBC: 13.4 10*3/uL — ABNORMAL HIGH (ref 4.0–10.5)
nRBC: 0 % (ref 0.0–0.2)

## 2023-02-13 LAB — TROPONIN I (HIGH SENSITIVITY)
Troponin I (High Sensitivity): 5 ng/L (ref <18)
Troponin I (High Sensitivity): 5 ng/L (ref <18)

## 2023-02-13 MED ORDER — KETOROLAC TROMETHAMINE 30 MG/ML IJ SOLN
15.0000 mg | Freq: Once | INTRAMUSCULAR | Status: AC
  Administered 2023-02-13: 15 mg via INTRAVENOUS
  Filled 2023-02-13: qty 1

## 2023-02-13 NOTE — ED Provider Notes (Signed)
[place] EMERGENCY DEPARTMENT AT [facility] Provider Note   CSN: [id number] Arrival date & time: 02/13/23  1957     History  Chief Complaint  Patient presents with   Chest Pain    CRAMER MCNERNEY is a 77 y.o. male.  This is a 77 year old male who is here today for chest pain.  Patient lives in a skilled nursing facility, was sitting down and began to experience pain over the left side of his chest.  No shortness of breath.  He says that he had something like this happened similarly about a month ago.   Chest Pain      Home Medications Prior to Admission medications   Medication Sig Start Date End Date Taking? Authorizing Provider  amLODipine (NORVASC) 5 MG tablet Take 1 tablet (5 mg total) by mouth daily. 10/04/22   Lanae Boast, MD  apixaban (ELIQUIS) 5 MG TABS tablet Take 1 tablet (5 mg total) by mouth 2 (two) times daily. 10/03/22   Lanae Boast, MD  BD PEN NEEDLE MICRO U/F 32G X 6 MM MISC Inject into the skin 3 (three) times daily. 09/16/22   [provider]  BD PEN NEEDLE NANO 2ND GEN 32G X 4 MM MISC 1 each by Other route as directed. 06/07/22   [provider]  carbidopa-levodopa (SINEMET IR) 25-100 MG tablet Take 1 tablet by mouth 3 (three) times daily. 04/10/22   Levert Feinstein, MD  clopidogrel (PLAVIX) 75 MG tablet Take 1 tablet (75 mg total) by mouth daily with breakfast. 10/04/22   Lanae Boast, MD  Continuous Glucose Sensor (FREESTYLE LIBRE 2 SENSOR) MISC Place onto the skin. 09/18/22   [provider]  diltiazem (TIADYLT ER) 300 MG 24 hr capsule Take 1 capsule (300 mg total) by mouth daily. Pt needs to keep upcoming appt for further refills. 12/13/22   Jake Bathe, MD  famotidine (PEPCID) 20 MG tablet Take 1 tablet (20 mg total) by mouth daily. 11/13/22   Rexford Maus, DO  folic acid (FOLVITE) 1 MG tablet Take 1 tablet (1 mg total) by mouth daily. 09/26/22   Rice, Jamesetta Orleans, MD  GVOKE HYPOPEN 2-PACK 1 MG/0.2ML SOAJ Inject 1 mg into the  skin as needed (hypoglycemia). 04/11/22   [provider]  HUMIRA, 2 PEN, 40 MG/0.4ML PNKT Inject 0.4 mLs into the muscle every 14 (fourteen) days. 09/30/22   Fuller Plan, MD  hydroxychloroquine (PLAQUENIL) 200 MG tablet Take 1 tablet (200 mg total) by mouth daily. 09/26/22   Rice, Jamesetta Orleans, MD  insulin aspart (NOVOLOG) 100 UNIT/ML injection 0-5 Units, Subcutaneous, Daily at bedtime: HS scale CBG < 70: implement hypoglycemia measures CBG 70 - 120: 0 units CBG 121 - 150: 0 units CBG 151 - 200: 0 units CBG 201 - 250: 2 units CBG 251 - 300: 3 units CBG 301 - 350: 4 units CBG 351 - 400: 5 units CBG > 400: call MD and obtain STAT lab verification Patient not taking: Reported on 11/14/2022 11/29/21   Maretta Bees, MD  insulin aspart (NOVOLOG) 100 UNIT/ML injection 0-15 Units, Subcutaneous, 3 times daily with meals CBG < 70: Implement Hypoglycemia Standing Orders and refer to Hypoglycemia Standing Orders sidebar report CBG 70 - 120: 0 units CBG 121 - 150: 2 units CBG 151 - 200: 3 units CBG 201 - 250: 5 units CBG 251 - 300: 8 units CBG 301 - 350: 11 units CBG 351 - 400: 15 units CBG > 400: call  MD and obtain STAT lab verification 11/29/21   Maretta Bees, MD  isosorbide mononitrate (IMDUR) 30 MG 24 hr tablet Take 0.5 tablets (15 mg total) by mouth daily. Patient not taking: Reported on 11/14/2022 10/04/22   Lanae Boast, MD  meclizine (ANTIVERT) 25 MG tablet Take 25 mg by mouth 3 (three) times daily as needed for dizziness.    [provider]  methotrexate (RHEUMATREX) 2.5 MG tablet Take 6 tablets (15 mg total) by mouth once a week. Caution:Chemotherapy. Protect from light. Patient taking differently: Take 15 mg by mouth once a week. Tuesdays. Caution: Chemotherapy. Protect from light. 09/26/22   Rice, Jamesetta Orleans, MD  metoprolol succinate (TOPROL-XL) 25 MG 24 hr tablet Take 1 tablet (25 mg total) by mouth daily. 10/03/22   Lanae Boast, MD  MYRBETRIQ 25 MG TB24 tablet Take 25 mg by  mouth daily. 09/12/22   [provider]  nitroGLYCERIN (NITROSTAT) 0.4 MG SL tablet Place 1 tablet (0.4 mg total) under the tongue every 5 (five) minutes x 3 doses as needed for chest pain. 10/03/22   Lanae Boast, MD  ondansetron (ZOFRAN) 4 MG tablet Take 1 tablet (4 mg total) by mouth every 6 (six) hours. 11/13/22   Elayne Snare K, DO  pantoprazole (PROTONIX) 40 MG tablet Take 1 tablet (40 mg total) by mouth daily at 6 (six) AM. 10/03/22   Lanae Boast, MD  Study - ORION 4 - inclisiran 300 mg/1.39mL or placebo SQ injection (PI-Stuckey) Inject 1.5 mLs (300 mg total) into the skin every 6 (six) months. Patient not taking: Reported on 09/26/2022 09/06/21   Herby Abraham, MD  sucralfate (CARAFATE) 1 g tablet Take 1 tablet (1 g total) by mouth 4 (four) times daily -  with meals and at bedtime. 11/08/22   Fayrene Helper, PA-C  TRESIBA FLEXTOUCH 200 UNIT/ML FlexTouch Pen Inject 20 Units into the skin at bedtime. 11/08/22   [provider]      Allergies    Codeine, Penicillins, Prednisone, Wool alcohol [lanolin], Empagliflozin, Statins, and Penicillin g    Review of Systems   Review of Systems  Cardiovascular:  Positive for chest pain.    Physical Exam Updated Vital Signs BP 114/64   Pulse 68   Temp 98.1 F (36.7 C) (Oral)   Resp 19   SpO2 100%  Physical Exam Vitals reviewed.  HENT:     Head: Normocephalic.  Cardiovascular:     Rate and Rhythm: Normal rate and regular rhythm.     Heart sounds: Normal heart sounds.  Chest:     Chest wall: Tenderness present. No crepitus.  Abdominal:     Palpations: Abdomen is soft.  Musculoskeletal:     Cervical back: Normal range of motion.  Neurological:     Mental Status: He is alert.     ED Results / Procedures / Treatments   Labs (all labs ordered are listed, but only abnormal results are displayed) Labs Reviewed  BASIC METABOLIC PANEL - Abnormal; Notable for the following components:      Result Value   CO2 20 (*)     Glucose, Bld 146 (*)    Calcium 8.5 (*)    All other components within normal limits  CBC - Abnormal; Notable for the following components:   WBC 13.4 (*)    RBC 3.91 (*)    Hemoglobin 11.9 (*)    HCT 36.0 (*)    All other components within normal limits  TROPONIN I (HIGH SENSITIVITY)  TROPONIN I (HIGH SENSITIVITY)    EKG None  Radiology DG Chest Portable 1 View  Result Date: 02/13/2023 CLINICAL DATA:  Chest pain. EXAM: PORTABLE CHEST 1 VIEW COMPARISON:  12/08/2022 FINDINGS: Both lungs are clear. Heart and mediastinum are stable. Again noted is bone cement within the T5 vertebral body. Negative for a pneumothorax. No acute bone abnormality. Degenerative changes in the left shoulder. IMPRESSION: No active disease in the chest. Electronically Signed   By: Richarda Overlie M.D.   On: 02/13/2023 21:15    Procedures Procedures    Medications Ordered in ED Medications  ketorolac (TORADOL) 30 MG/ML injection 15 mg (has no administration in time range)    ED Course/ Medical Decision Making/ A&P                                 Medical Decision Making This is a 77 year old male here today for chest pain.  Differential diagnoses include ACS, chest wall pain, costochondritis, less likely pneumonia, less likely pneumothorax.  Plan-patient with reproducible pain over the left side of his chest.  Initial EKG, per my independent review does not show any evidence of ischemia.  Troponin ordered.  My independent review the patient's chest x-ray shows no pneumonia.  Reassessment-second troponin also negative.  His vital signs are normal, blood work and EKG also normal.  Based on his exam, I believe that this is musculoskeletal chest pain.  Will discharge patient, have him follow-up with his PCP.  Amount and/or Complexity of Data Reviewed Labs: ordered. Radiology: ordered.           Final Clinical Impression(s) / ED Diagnoses Final diagnoses:  Chest wall pain    Rx / DC Orders ED  Discharge Orders     None         Arletha Pili, DO 02/13/23 2343

## 2023-02-13 NOTE — ED Notes (Signed)
Called [facility] to transport patient to [facility].  

## 2023-02-13 NOTE — ED Triage Notes (Signed)
Pt is coming in for chest pain that he has had since 1830, did have an NSTEMI in June. Medic is unable to tell us the rehab he is from, Pt was unwilling to take aspirin due ti not being able to chew, he is on blood thinners.   Medic Vitals   100/58 60hr 20rr 96%ra 157bgl  18g rt ac

## 2023-02-13 NOTE — Discharge Instructions (Signed)
Your testing that was done in the emergency department today was normal.  You should follow-up with your primary care doctor in 1 week to discuss the symptoms.  Come back to the emergency department if you develop new or worsening pain in your chest or difficulty breathing.

## 2023-02-14 DIAGNOSIS — Z7401 Bed confinement status: Secondary | ICD-10-CM | POA: Diagnosis not present

## 2023-02-14 DIAGNOSIS — R079 Chest pain, unspecified: Secondary | ICD-10-CM | POA: Diagnosis not present

## 2023-02-15 DIAGNOSIS — E785 Hyperlipidemia, unspecified: Secondary | ICD-10-CM | POA: Diagnosis not present

## 2023-02-15 DIAGNOSIS — R2689 Other abnormalities of gait and mobility: Secondary | ICD-10-CM | POA: Diagnosis not present

## 2023-02-15 DIAGNOSIS — I214 Non-ST elevation (NSTEMI) myocardial infarction: Secondary | ICD-10-CM | POA: Diagnosis not present

## 2023-02-15 DIAGNOSIS — R2681 Unsteadiness on feet: Secondary | ICD-10-CM | POA: Diagnosis not present

## 2023-02-15 DIAGNOSIS — M6281 Muscle weakness (generalized): Secondary | ICD-10-CM | POA: Diagnosis not present

## 2023-02-16 DIAGNOSIS — R2689 Other abnormalities of gait and mobility: Secondary | ICD-10-CM | POA: Diagnosis not present

## 2023-02-16 DIAGNOSIS — M6281 Muscle weakness (generalized): Secondary | ICD-10-CM | POA: Diagnosis not present

## 2023-02-16 DIAGNOSIS — E785 Hyperlipidemia, unspecified: Secondary | ICD-10-CM | POA: Diagnosis not present

## 2023-02-16 DIAGNOSIS — R2681 Unsteadiness on feet: Secondary | ICD-10-CM | POA: Diagnosis not present

## 2023-02-16 DIAGNOSIS — I214 Non-ST elevation (NSTEMI) myocardial infarction: Secondary | ICD-10-CM | POA: Diagnosis not present

## 2023-02-17 DIAGNOSIS — R2689 Other abnormalities of gait and mobility: Secondary | ICD-10-CM | POA: Diagnosis not present

## 2023-02-17 DIAGNOSIS — I214 Non-ST elevation (NSTEMI) myocardial infarction: Secondary | ICD-10-CM | POA: Diagnosis not present

## 2023-02-17 DIAGNOSIS — R2681 Unsteadiness on feet: Secondary | ICD-10-CM | POA: Diagnosis not present

## 2023-02-17 DIAGNOSIS — E785 Hyperlipidemia, unspecified: Secondary | ICD-10-CM | POA: Diagnosis not present

## 2023-02-17 DIAGNOSIS — M6281 Muscle weakness (generalized): Secondary | ICD-10-CM | POA: Diagnosis not present

## 2023-02-18 DIAGNOSIS — M6281 Muscle weakness (generalized): Secondary | ICD-10-CM | POA: Diagnosis not present

## 2023-02-18 DIAGNOSIS — I214 Non-ST elevation (NSTEMI) myocardial infarction: Secondary | ICD-10-CM | POA: Diagnosis not present

## 2023-02-18 DIAGNOSIS — R2689 Other abnormalities of gait and mobility: Secondary | ICD-10-CM | POA: Diagnosis not present

## 2023-02-18 DIAGNOSIS — E785 Hyperlipidemia, unspecified: Secondary | ICD-10-CM | POA: Diagnosis not present

## 2023-02-18 DIAGNOSIS — R2681 Unsteadiness on feet: Secondary | ICD-10-CM | POA: Diagnosis not present

## 2023-02-19 DIAGNOSIS — I10 Essential (primary) hypertension: Secondary | ICD-10-CM | POA: Diagnosis not present

## 2023-02-19 DIAGNOSIS — E785 Hyperlipidemia, unspecified: Secondary | ICD-10-CM | POA: Diagnosis not present

## 2023-02-19 DIAGNOSIS — M6281 Muscle weakness (generalized): Secondary | ICD-10-CM | POA: Diagnosis not present

## 2023-02-19 DIAGNOSIS — I25110 Atherosclerotic heart disease of native coronary artery with unstable angina pectoris: Secondary | ICD-10-CM | POA: Diagnosis not present

## 2023-02-19 DIAGNOSIS — E1165 Type 2 diabetes mellitus with hyperglycemia: Secondary | ICD-10-CM | POA: Diagnosis not present

## 2023-02-19 DIAGNOSIS — I214 Non-ST elevation (NSTEMI) myocardial infarction: Secondary | ICD-10-CM | POA: Diagnosis not present

## 2023-02-19 DIAGNOSIS — R2681 Unsteadiness on feet: Secondary | ICD-10-CM | POA: Diagnosis not present

## 2023-02-19 DIAGNOSIS — F320 Major depressive disorder, single episode, mild: Secondary | ICD-10-CM | POA: Diagnosis not present

## 2023-02-19 DIAGNOSIS — R2689 Other abnormalities of gait and mobility: Secondary | ICD-10-CM | POA: Diagnosis not present

## 2023-02-20 DIAGNOSIS — I214 Non-ST elevation (NSTEMI) myocardial infarction: Secondary | ICD-10-CM | POA: Diagnosis not present

## 2023-02-20 DIAGNOSIS — M6281 Muscle weakness (generalized): Secondary | ICD-10-CM | POA: Diagnosis not present

## 2023-02-20 DIAGNOSIS — R2681 Unsteadiness on feet: Secondary | ICD-10-CM | POA: Diagnosis not present

## 2023-02-20 DIAGNOSIS — R2689 Other abnormalities of gait and mobility: Secondary | ICD-10-CM | POA: Diagnosis not present

## 2023-02-20 DIAGNOSIS — E785 Hyperlipidemia, unspecified: Secondary | ICD-10-CM | POA: Diagnosis not present

## 2023-02-21 DIAGNOSIS — M6281 Muscle weakness (generalized): Secondary | ICD-10-CM | POA: Diagnosis not present

## 2023-02-21 DIAGNOSIS — I214 Non-ST elevation (NSTEMI) myocardial infarction: Secondary | ICD-10-CM | POA: Diagnosis not present

## 2023-02-21 DIAGNOSIS — E785 Hyperlipidemia, unspecified: Secondary | ICD-10-CM | POA: Diagnosis not present

## 2023-02-21 DIAGNOSIS — R2681 Unsteadiness on feet: Secondary | ICD-10-CM | POA: Diagnosis not present

## 2023-02-21 DIAGNOSIS — R2689 Other abnormalities of gait and mobility: Secondary | ICD-10-CM | POA: Diagnosis not present

## 2023-02-22 DIAGNOSIS — E785 Hyperlipidemia, unspecified: Secondary | ICD-10-CM | POA: Diagnosis not present

## 2023-02-22 DIAGNOSIS — I214 Non-ST elevation (NSTEMI) myocardial infarction: Secondary | ICD-10-CM | POA: Diagnosis not present

## 2023-02-22 DIAGNOSIS — R2689 Other abnormalities of gait and mobility: Secondary | ICD-10-CM | POA: Diagnosis not present

## 2023-02-22 DIAGNOSIS — M6281 Muscle weakness (generalized): Secondary | ICD-10-CM | POA: Diagnosis not present

## 2023-02-22 DIAGNOSIS — R2681 Unsteadiness on feet: Secondary | ICD-10-CM | POA: Diagnosis not present

## 2023-02-23 DIAGNOSIS — R2681 Unsteadiness on feet: Secondary | ICD-10-CM | POA: Diagnosis not present

## 2023-02-23 DIAGNOSIS — I214 Non-ST elevation (NSTEMI) myocardial infarction: Secondary | ICD-10-CM | POA: Diagnosis not present

## 2023-02-23 DIAGNOSIS — R2689 Other abnormalities of gait and mobility: Secondary | ICD-10-CM | POA: Diagnosis not present

## 2023-02-23 DIAGNOSIS — E785 Hyperlipidemia, unspecified: Secondary | ICD-10-CM | POA: Diagnosis not present

## 2023-02-23 DIAGNOSIS — M6281 Muscle weakness (generalized): Secondary | ICD-10-CM | POA: Diagnosis not present

## 2023-02-24 DIAGNOSIS — I214 Non-ST elevation (NSTEMI) myocardial infarction: Secondary | ICD-10-CM | POA: Diagnosis not present

## 2023-02-24 DIAGNOSIS — M6281 Muscle weakness (generalized): Secondary | ICD-10-CM | POA: Diagnosis not present

## 2023-02-24 DIAGNOSIS — E785 Hyperlipidemia, unspecified: Secondary | ICD-10-CM | POA: Diagnosis not present

## 2023-02-24 DIAGNOSIS — R2681 Unsteadiness on feet: Secondary | ICD-10-CM | POA: Diagnosis not present

## 2023-02-24 DIAGNOSIS — R2689 Other abnormalities of gait and mobility: Secondary | ICD-10-CM | POA: Diagnosis not present

## 2023-03-02 ENCOUNTER — Emergency Department (HOSPITAL_COMMUNITY)
Admission: EM | Admit: 2023-03-02 | Discharge: 2023-03-03 | Disposition: A | Discharge status: Home / Self Care | Payer: Medicare HMO | Attending: Emergency Medicine | Admitting: Emergency Medicine

## 2023-03-02 ENCOUNTER — Encounter (HOSPITAL_COMMUNITY): Payer: Self-pay | Admitting: *Deleted

## 2023-03-02 ENCOUNTER — Emergency Department (HOSPITAL_COMMUNITY): Payer: Medicare HMO

## 2023-03-02 ENCOUNTER — Other Ambulatory Visit: Payer: Self-pay

## 2023-03-02 DIAGNOSIS — R0789 Other chest pain: Secondary | ICD-10-CM | POA: Diagnosis not present

## 2023-03-02 DIAGNOSIS — Z794 Long term (current) use of insulin: Secondary | ICD-10-CM | POA: Insufficient documentation

## 2023-03-02 DIAGNOSIS — I2510 Atherosclerotic heart disease of native coronary artery without angina pectoris: Secondary | ICD-10-CM | POA: Insufficient documentation

## 2023-03-02 DIAGNOSIS — Z7902 Long term (current) use of antithrombotics/antiplatelets: Secondary | ICD-10-CM | POA: Insufficient documentation

## 2023-03-02 DIAGNOSIS — Z1152 Encounter for screening for COVID-19: Secondary | ICD-10-CM | POA: Insufficient documentation

## 2023-03-02 DIAGNOSIS — R059 Cough, unspecified: Secondary | ICD-10-CM | POA: Diagnosis not present

## 2023-03-02 DIAGNOSIS — G4489 Other headache syndrome: Secondary | ICD-10-CM | POA: Diagnosis not present

## 2023-03-02 DIAGNOSIS — J069 Acute upper respiratory infection, unspecified: Secondary | ICD-10-CM | POA: Insufficient documentation

## 2023-03-02 DIAGNOSIS — Z7901 Long term (current) use of anticoagulants: Secondary | ICD-10-CM | POA: Insufficient documentation

## 2023-03-02 DIAGNOSIS — J029 Acute pharyngitis, unspecified: Secondary | ICD-10-CM | POA: Diagnosis present

## 2023-03-02 DIAGNOSIS — R079 Chest pain, unspecified: Secondary | ICD-10-CM | POA: Diagnosis not present

## 2023-03-02 DIAGNOSIS — I959 Hypotension, unspecified: Secondary | ICD-10-CM | POA: Diagnosis not present

## 2023-03-02 DIAGNOSIS — E119 Type 2 diabetes mellitus without complications: Secondary | ICD-10-CM | POA: Insufficient documentation

## 2023-03-02 DIAGNOSIS — B9789 Other viral agents as the cause of diseases classified elsewhere: Secondary | ICD-10-CM | POA: Diagnosis not present

## 2023-03-02 DIAGNOSIS — R070 Pain in throat: Secondary | ICD-10-CM | POA: Diagnosis not present

## 2023-03-02 LAB — CBC WITH DIFFERENTIAL/PLATELET
Abs Immature Granulocytes: 0.03 10*3/uL (ref 0.00–0.07)
Basophils Absolute: 0.1 10*3/uL (ref 0.0–0.1)
Basophils Relative: 1 %
Eosinophils Absolute: 0.2 10*3/uL (ref 0.0–0.5)
Eosinophils Relative: 2 %
HCT: 37 % — ABNORMAL LOW (ref 39.0–52.0)
Hemoglobin: 12.2 g/dL — ABNORMAL LOW (ref 13.0–17.0)
Immature Granulocytes: 0 %
Lymphocytes Relative: 15 %
Lymphs Abs: 2 10*3/uL (ref 0.7–4.0)
MCH: 30.3 pg (ref 26.0–34.0)
MCHC: 33 g/dL (ref 30.0–36.0)
MCV: 92 fL (ref 80.0–100.0)
Monocytes Absolute: 0.9 10*3/uL (ref 0.1–1.0)
Monocytes Relative: 7 %
Neutro Abs: 9.8 10*3/uL — ABNORMAL HIGH (ref 1.7–7.7)
Neutrophils Relative %: 75 %
Platelets: 291 10*3/uL (ref 150–400)
RBC: 4.02 MIL/uL — ABNORMAL LOW (ref 4.22–5.81)
RDW: 14.1 % (ref 11.5–15.5)
WBC: 13 10*3/uL — ABNORMAL HIGH (ref 4.0–10.5)
nRBC: 0 % (ref 0.0–0.2)

## 2023-03-02 LAB — SARS CORONAVIRUS 2 BY RT PCR: SARS Coronavirus 2 by RT PCR: NEGATIVE

## 2023-03-02 LAB — COMPREHENSIVE METABOLIC PANEL
ALT: 14 U/L (ref 0–44)
AST: 16 U/L (ref 15–41)
Albumin: 3.2 g/dL — ABNORMAL LOW (ref 3.5–5.0)
Alkaline Phosphatase: 110 U/L (ref 38–126)
Anion gap: 9 (ref 5–15)
BUN: 20 mg/dL (ref 8–23)
CO2: 24 mmol/L (ref 22–32)
Calcium: 8.6 mg/dL — ABNORMAL LOW (ref 8.9–10.3)
Chloride: 101 mmol/L (ref 98–111)
Creatinine, Ser: 0.56 mg/dL — ABNORMAL LOW (ref 0.61–1.24)
GFR, Estimated: >60 mL/min (ref >60)
Glucose, Bld: 143 mg/dL — ABNORMAL HIGH (ref 70–99)
Potassium: 3.9 mmol/L (ref 3.5–5.1)
Sodium: 134 mmol/L — ABNORMAL LOW (ref 135–145)
Total Bilirubin: 0.6 mg/dL (ref 0.3–1.2)
Total Protein: 7.4 g/dL (ref 6.5–8.1)

## 2023-03-02 LAB — TROPONIN I (HIGH SENSITIVITY): Troponin I (High Sensitivity): 3 ng/L (ref <18)

## 2023-03-02 MED ORDER — ACETAMINOPHEN 325 MG PO TABS
650.0000 mg | ORAL_TABLET | Freq: Once | ORAL | Status: AC
  Administered 2023-03-02: 650 mg via ORAL
  Filled 2023-03-02: qty 2

## 2023-03-02 NOTE — ED Notes (Signed)
Call to [facility] for transport back to [facility].

## 2023-03-02 NOTE — ED Triage Notes (Signed)
Pt is in rehab at [facility].  He woke up this am with sore throat, as the day progressed he began feeling worse and has cough with sharp pain in ribs with coughing.  Pt is alert and oriented, hx of cardiac stent placement.

## 2023-03-02 NOTE — ED Provider Notes (Signed)
[place] EMERGENCY DEPARTMENT AT [facility] Provider Note   CSN: [id number] Arrival date & time: 03/02/23  1935     History  Chief Complaint  Patient presents with   Sore Throat    William Hunter is a 77 y.o. male.  HPI 77 year old male presents from his facility with chest pain.  He has multiple comorbidities which include a previous DVT, CAD, type 2 diabetes and other comorbidities.  He woke up this morning with a sore throat, primarily right-sided at around 4 AM.  Around 9 AM he developed a headache and chest pain.  The chest pain is diffuse and sharp.  Worsens when he coughs and he has had a mild cough though no dyspnea.  Thinks he might of had a fever when he got here.  The facility was concerned because of his chest pain and gave him 3 nitroglycerin which he states did not help.  The sharp nature of the chest pain makes this feel this similar to his previous cardiac type chest pain.  Home Medications Prior to Admission medications   Medication Sig Start Date End Date Taking? Authorizing Provider  amLODipine (NORVASC) 5 MG tablet Take 1 tablet (5 mg total) by mouth daily. 10/04/22   Lanae Boast, MD  apixaban (ELIQUIS) 5 MG TABS tablet Take 1 tablet (5 mg total) by mouth 2 (two) times daily. 10/03/22   Lanae Boast, MD  BD PEN NEEDLE MICRO U/F 32G X 6 MM MISC Inject into the skin 3 (three) times daily. 09/16/22   [provider]  BD PEN NEEDLE NANO 2ND GEN 32G X 4 MM MISC 1 each by Other route as directed. 06/07/22   [provider]  carbidopa-levodopa (SINEMET IR) 25-100 MG tablet Take 1 tablet by mouth 3 (three) times daily. 04/10/22   Levert Feinstein, MD  clopidogrel (PLAVIX) 75 MG tablet Take 1 tablet (75 mg total) by mouth daily with breakfast. 10/04/22   Lanae Boast, MD  Continuous Glucose Sensor (FREESTYLE LIBRE 2 SENSOR) MISC Place onto the skin. 09/18/22   [provider]  diltiazem (TIADYLT ER) 300 MG 24 hr capsule Take 1 capsule (300 mg total) by  mouth daily. Pt needs to keep upcoming appt for further refills. 12/13/22   Jake Bathe, MD  famotidine (PEPCID) 20 MG tablet Take 1 tablet (20 mg total) by mouth daily. 11/13/22   Rexford Maus, DO  folic acid (FOLVITE) 1 MG tablet Take 1 tablet (1 mg total) by mouth daily. 09/26/22   Rice, Jamesetta Orleans, MD  GVOKE HYPOPEN 2-PACK 1 MG/0.2ML SOAJ Inject 1 mg into the skin as needed (hypoglycemia). 04/11/22   [provider]  HUMIRA, 2 PEN, 40 MG/0.4ML PNKT Inject 0.4 mLs into the muscle every 14 (fourteen) days. 09/30/22   Fuller Plan, MD  hydroxychloroquine (PLAQUENIL) 200 MG tablet Take 1 tablet (200 mg total) by mouth daily. 09/26/22   Rice, Jamesetta Orleans, MD  insulin aspart (NOVOLOG) 100 UNIT/ML injection 0-5 Units, Subcutaneous, Daily at bedtime: HS scale CBG < 70: implement hypoglycemia measures CBG 70 - 120: 0 units CBG 121 - 150: 0 units CBG 151 - 200: 0 units CBG 201 - 250: 2 units CBG 251 - 300: 3 units CBG 301 - 350: 4 units CBG 351 - 400: 5 units CBG > 400: call MD and obtain STAT lab verification Patient not taking: Reported on 11/14/2022 11/29/21   Maretta Bees, MD  insulin aspart (NOVOLOG) 100 UNIT/ML injection 0-15 Units,  Subcutaneous, 3 times daily with meals CBG < 70: Implement Hypoglycemia Standing Orders and refer to Hypoglycemia Standing Orders sidebar report CBG 70 - 120: 0 units CBG 121 - 150: 2 units CBG 151 - 200: 3 units CBG 201 - 250: 5 units CBG 251 - 300: 8 units CBG 301 - 350: 11 units CBG 351 - 400: 15 units CBG > 400: call MD and obtain STAT lab verification 11/29/21   Maretta Bees, MD  isosorbide mononitrate (IMDUR) 30 MG 24 hr tablet Take 0.5 tablets (15 mg total) by mouth daily. Patient not taking: Reported on 11/14/2022 10/04/22   Lanae Boast, MD  meclizine (ANTIVERT) 25 MG tablet Take 25 mg by mouth 3 (three) times daily as needed for dizziness.    [provider]  methotrexate (RHEUMATREX) 2.5 MG tablet Take 6 tablets (15 mg total) by  mouth once a week. Caution:Chemotherapy. Protect from light. Patient taking differently: Take 15 mg by mouth once a week. Tuesdays. Caution: Chemotherapy. Protect from light. 09/26/22   Rice, Jamesetta Orleans, MD  metoprolol succinate (TOPROL-XL) 25 MG 24 hr tablet Take 1 tablet (25 mg total) by mouth daily. 10/03/22   Lanae Boast, MD  MYRBETRIQ 25 MG TB24 tablet Take 25 mg by mouth daily. 09/12/22   [provider]  nitroGLYCERIN (NITROSTAT) 0.4 MG SL tablet Place 1 tablet (0.4 mg total) under the tongue every 5 (five) minutes x 3 doses as needed for chest pain. 10/03/22   Lanae Boast, MD  ondansetron (ZOFRAN) 4 MG tablet Take 1 tablet (4 mg total) by mouth every 6 (six) hours. 11/13/22   Elayne Snare K, DO  pantoprazole (PROTONIX) 40 MG tablet Take 1 tablet (40 mg total) by mouth daily at 6 (six) AM. 10/03/22   Lanae Boast, MD  Study - ORION 4 - inclisiran 300 mg/1.15mL or placebo SQ injection (PI-Stuckey) Inject 1.5 mLs (300 mg total) into the skin every 6 (six) months. Patient not taking: Reported on 09/26/2022 09/06/21   Herby Abraham, MD  sucralfate (CARAFATE) 1 g tablet Take 1 tablet (1 g total) by mouth 4 (four) times daily -  with meals and at bedtime. 11/08/22   Fayrene Helper, PA-C  TRESIBA FLEXTOUCH 200 UNIT/ML FlexTouch Pen Inject 20 Units into the skin at bedtime. 11/08/22   [provider]      Allergies    Codeine, Penicillins, Prednisone, Wool alcohol [lanolin], Empagliflozin, Statins, and Penicillin g    Review of Systems   Review of Systems  HENT:  Positive for sore throat.   Respiratory:  Positive for cough. Negative for shortness of breath.   Cardiovascular:  Positive for chest pain.  Gastrointestinal:  Negative for abdominal pain.  Neurological:  Positive for headaches.    Physical Exam Updated Vital Signs BP (!) 126/59 (BP Location: Right Arm)   Pulse 61   Temp 99 F (37.2 C) (Oral)   Resp 18   SpO2 99%  Physical Exam Vitals and nursing note reviewed.   Constitutional:      General: He is not in acute distress.    Appearance: He is well-developed. He is not ill-appearing or diaphoretic.  HENT:     Head: Normocephalic and atraumatic.     Mouth/Throat:     Mouth: No oral lesions.     Pharynx: Oropharynx is clear. Uvula midline. No pharyngeal swelling, oropharyngeal exudate or posterior oropharyngeal erythema.  Cardiovascular:     Rate and Rhythm: Normal rate and regular rhythm.  Heart sounds: Normal heart sounds.  Pulmonary:     Effort: Pulmonary effort is normal.     Breath sounds: Normal breath sounds.  Chest:     Chest wall: Tenderness (diffuse, bilateral anterior chest) present.  Abdominal:     General: There is no distension.     Palpations: Abdomen is soft.     Tenderness: There is no abdominal tenderness.  Skin:    General: Skin is warm and dry.  Neurological:     Mental Status: He is alert.     ED Results / Procedures / Treatments   Labs (all labs ordered are listed, but only abnormal results are displayed) Labs Reviewed  COMPREHENSIVE METABOLIC PANEL - Abnormal; Notable for the following components:      Result Value   Sodium 134 (*)    Glucose, Bld 143 (*)    Creatinine, Ser 0.56 (*)    Calcium 8.6 (*)    Albumin 3.2 (*)    All other components within normal limits  CBC WITH DIFFERENTIAL/PLATELET - Abnormal; Notable for the following components:   WBC 13.0 (*)    RBC 4.02 (*)    Hemoglobin 12.2 (*)    HCT 37.0 (*)    Neutro Abs 9.8 (*)    All other components within normal limits  SARS CORONAVIRUS 2 BY RT PCR  TROPONIN I (HIGH SENSITIVITY)    EKG EKG Interpretation Date/Time:  Sunday March 02 2023 19:57:47 EDT Ventricular Rate:  61 PR Interval:  194 QRS Duration:  87 QT Interval:  413 QTC Calculation: 416 R Axis:   52  Text Interpretation: Sinus rhythm Abnormal R-wave progression, early transition no significant change since Sept 5 2024 Confirmed by Pricilla Loveless [phone]) on 03/02/2023  8:05:45 PM  Radiology DG Chest Portable 1 View  Result Date: 03/02/2023 CLINICAL DATA:  Chest pain and cough EXAM: PORTABLE CHEST 1 VIEW COMPARISON:  Chest radiograph dated 02/13/2023 FINDINGS: Normal lung volumes. No focal consolidations. No pleural effusion or pneumothorax. The heart size and mediastinal contours are within normal limits. Prior thoracic vertebral augmentation. IMPRESSION: No focal consolidations. Electronically Signed   By: Agustin Cree M.D.   On: 03/02/2023 20:51    Procedures Procedures    Medications Ordered in ED Medications  acetaminophen (TYLENOL) tablet 650 mg (650 mg Oral Given 03/02/23 2055)    ED Course/ Medical Decision Making/ A&P                                 Medical Decision Making Amount and/or Complexity of Data Reviewed Labs: ordered.    Details: Normal troponin.  Normal COVID test. Radiology: ordered and independent interpretation performed.    Details: No pneumonia ECG/medicine tests: ordered and independent interpretation performed.    Details: No change from baseline.  Risk OTC drugs.   Patient's COVID test is negative.  His sore throat/cough is likely from a viral illness.  No signs of pneumonia on workup or exam.  Chest pain has been atypical and reproducible all day for 11+ hours.  With a negative troponin think ACS is pretty unlikely.  Doubt PE.  He does have a known cardiac history but I think this is so atypical that I do not think a second troponin is needed after so many hours.  Will discharge home with return precautions.        Final Clinical Impression(s) / ED Diagnoses Final diagnoses:  Viral upper respiratory tract  infection  Chest wall pain    Rx / DC Orders ED Discharge Orders     None         Pricilla Loveless, MD 03/02/23 2210

## 2023-03-02 NOTE — Discharge Instructions (Signed)
Your COVID test is negative.  There is no sign of a heart attack today.  You may take Tylenol to help with pain.  Follow-up with your primary care physician.  If you develop recurrent, continued, or worsening chest pain, shortness of breath, fever, vomiting, abdominal or back pain, or any other new/concerning symptoms then return to the ER for evaluation.

## 2023-03-03 DIAGNOSIS — F338 Other recurrent depressive disorders: Secondary | ICD-10-CM | POA: Diagnosis not present

## 2023-03-03 DIAGNOSIS — J398 Other specified diseases of upper respiratory tract: Secondary | ICD-10-CM | POA: Diagnosis not present

## 2023-03-03 DIAGNOSIS — Z7401 Bed confinement status: Secondary | ICD-10-CM | POA: Diagnosis not present

## 2023-03-03 DIAGNOSIS — I25110 Atherosclerotic heart disease of native coronary artery with unstable angina pectoris: Secondary | ICD-10-CM | POA: Diagnosis not present

## 2023-03-03 DIAGNOSIS — R531 Weakness: Secondary | ICD-10-CM | POA: Diagnosis not present

## 2023-03-03 NOTE — ED Notes (Signed)
Gave pt ham sandwich and ginger ale

## 2023-03-16 ENCOUNTER — Observation Stay (HOSPITAL_COMMUNITY)
Admission: EM | Admit: 2023-03-16 | Discharge: 2023-03-20 | Disposition: A | Discharge status: Skilled Nursing Facility | Payer: Medicare HMO | Attending: Internal Medicine | Admitting: Internal Medicine

## 2023-03-16 ENCOUNTER — Emergency Department (HOSPITAL_COMMUNITY): Payer: Medicare HMO

## 2023-03-16 ENCOUNTER — Other Ambulatory Visit: Payer: Self-pay

## 2023-03-16 ENCOUNTER — Encounter (HOSPITAL_COMMUNITY): Payer: Self-pay | Admitting: Emergency Medicine

## 2023-03-16 DIAGNOSIS — R42 Dizziness and giddiness: Secondary | ICD-10-CM | POA: Diagnosis not present

## 2023-03-16 DIAGNOSIS — J42 Unspecified chronic bronchitis: Secondary | ICD-10-CM | POA: Diagnosis not present

## 2023-03-16 DIAGNOSIS — D509 Iron deficiency anemia, unspecified: Secondary | ICD-10-CM | POA: Diagnosis not present

## 2023-03-16 DIAGNOSIS — E1165 Type 2 diabetes mellitus with hyperglycemia: Secondary | ICD-10-CM | POA: Insufficient documentation

## 2023-03-16 DIAGNOSIS — I10 Essential (primary) hypertension: Secondary | ICD-10-CM | POA: Diagnosis not present

## 2023-03-16 DIAGNOSIS — I209 Angina pectoris, unspecified: Secondary | ICD-10-CM | POA: Diagnosis not present

## 2023-03-16 DIAGNOSIS — E876 Hypokalemia: Secondary | ICD-10-CM | POA: Diagnosis not present

## 2023-03-16 DIAGNOSIS — D649 Anemia, unspecified: Secondary | ICD-10-CM | POA: Diagnosis not present

## 2023-03-16 DIAGNOSIS — I5032 Chronic diastolic (congestive) heart failure: Secondary | ICD-10-CM | POA: Diagnosis not present

## 2023-03-16 DIAGNOSIS — Z955 Presence of coronary angioplasty implant and graft: Secondary | ICD-10-CM | POA: Insufficient documentation

## 2023-03-16 DIAGNOSIS — R0789 Other chest pain: Secondary | ICD-10-CM | POA: Diagnosis not present

## 2023-03-16 DIAGNOSIS — K922 Gastrointestinal hemorrhage, unspecified: Secondary | ICD-10-CM

## 2023-03-16 DIAGNOSIS — K3189 Other diseases of stomach and duodenum: Secondary | ICD-10-CM | POA: Diagnosis not present

## 2023-03-16 DIAGNOSIS — R079 Chest pain, unspecified: Secondary | ICD-10-CM | POA: Diagnosis not present

## 2023-03-16 DIAGNOSIS — Z794 Long term (current) use of insulin: Secondary | ICD-10-CM | POA: Insufficient documentation

## 2023-03-16 DIAGNOSIS — I110 Hypertensive heart disease with heart failure: Secondary | ICD-10-CM | POA: Insufficient documentation

## 2023-03-16 DIAGNOSIS — Z79899 Other long term (current) drug therapy: Secondary | ICD-10-CM | POA: Diagnosis not present

## 2023-03-16 DIAGNOSIS — I2510 Atherosclerotic heart disease of native coronary artery without angina pectoris: Secondary | ICD-10-CM | POA: Diagnosis not present

## 2023-03-16 DIAGNOSIS — I959 Hypotension, unspecified: Secondary | ICD-10-CM | POA: Diagnosis not present

## 2023-03-16 DIAGNOSIS — Z8616 Personal history of COVID-19: Secondary | ICD-10-CM | POA: Insufficient documentation

## 2023-03-16 DIAGNOSIS — K921 Melena: Secondary | ICD-10-CM | POA: Diagnosis present

## 2023-03-16 DIAGNOSIS — I700 Atherosclerosis of aorta: Secondary | ICD-10-CM | POA: Diagnosis not present

## 2023-03-16 DIAGNOSIS — Z7901 Long term (current) use of anticoagulants: Secondary | ICD-10-CM | POA: Diagnosis not present

## 2023-03-16 DIAGNOSIS — R000 Tachycardia, unspecified: Secondary | ICD-10-CM | POA: Diagnosis not present

## 2023-03-16 DIAGNOSIS — Z86718 Personal history of other venous thrombosis and embolism: Secondary | ICD-10-CM | POA: Insufficient documentation

## 2023-03-16 DIAGNOSIS — K449 Diaphragmatic hernia without obstruction or gangrene: Secondary | ICD-10-CM | POA: Insufficient documentation

## 2023-03-16 LAB — CBC WITH DIFFERENTIAL/PLATELET
Abs Immature Granulocytes: 0.02 10*3/uL (ref 0.00–0.07)
Basophils Absolute: 0.1 10*3/uL (ref 0.0–0.1)
Basophils Relative: 1 %
Eosinophils Absolute: 0.2 10*3/uL (ref 0.0–0.5)
Eosinophils Relative: 3 %
HCT: 25 % — ABNORMAL LOW (ref 39.0–52.0)
Hemoglobin: 8.2 g/dL — ABNORMAL LOW (ref 13.0–17.0)
Immature Granulocytes: 0 %
Lymphocytes Relative: 22 %
Lymphs Abs: 1.5 10*3/uL (ref 0.7–4.0)
MCH: 30.3 pg (ref 26.0–34.0)
MCHC: 32.8 g/dL (ref 30.0–36.0)
MCV: 92.3 fL (ref 80.0–100.0)
Monocytes Absolute: 0.5 10*3/uL (ref 0.1–1.0)
Monocytes Relative: 7 %
Neutro Abs: 4.7 10*3/uL (ref 1.7–7.7)
Neutrophils Relative %: 67 %
Platelets: 241 10*3/uL (ref 150–400)
RBC: 2.71 MIL/uL — ABNORMAL LOW (ref 4.22–5.81)
RDW: 14.1 % (ref 11.5–15.5)
WBC: 7 10*3/uL (ref 4.0–10.5)
nRBC: 0 % (ref 0.0–0.2)

## 2023-03-16 LAB — COMPREHENSIVE METABOLIC PANEL
ALT: 5 U/L (ref 0–44)
AST: 19 U/L (ref 15–41)
Albumin: 2.7 g/dL — ABNORMAL LOW (ref 3.5–5.0)
Alkaline Phosphatase: 104 U/L (ref 38–126)
Anion gap: 13 (ref 5–15)
BUN: 15 mg/dL (ref 8–23)
CO2: 22 mmol/L (ref 22–32)
Calcium: 8.4 mg/dL — ABNORMAL LOW (ref 8.9–10.3)
Chloride: 102 mmol/L (ref 98–111)
Creatinine, Ser: 0.79 mg/dL (ref 0.61–1.24)
GFR, Estimated: >60 mL/min (ref >60)
Glucose, Bld: 129 mg/dL — ABNORMAL HIGH (ref 70–99)
Potassium: 3.6 mmol/L (ref 3.5–5.1)
Sodium: 137 mmol/L (ref 135–145)
Total Bilirubin: 0.2 mg/dL — ABNORMAL LOW (ref 0.3–1.2)
Total Protein: 6.5 g/dL (ref 6.5–8.1)

## 2023-03-16 LAB — RETICULOCYTES
Immature Retic Fract: 3.6 % (ref 2.3–15.9)
RBC.: 2.49 MIL/uL — ABNORMAL LOW (ref 4.22–5.81)
Retic Count, Absolute: 27.4 10*3/uL (ref 19.0–186.0)
Retic Ct Pct: 1.1 % (ref 0.4–3.1)

## 2023-03-16 LAB — TROPONIN I (HIGH SENSITIVITY)
Troponin I (High Sensitivity): 5 ng/L (ref <18)
Troponin I (High Sensitivity): 5 ng/L (ref <18)

## 2023-03-16 LAB — POC OCCULT BLOOD, ED: Fecal Occult Bld: POSITIVE — AB

## 2023-03-16 LAB — BRAIN NATRIURETIC PEPTIDE: B Natriuretic Peptide: 46.3 pg/mL (ref 0.0–100.0)

## 2023-03-16 LAB — PREPARE RBC (CROSSMATCH)

## 2023-03-16 LAB — HEMOGLOBIN AND HEMATOCRIT, BLOOD
HCT: 22.9 % — ABNORMAL LOW (ref 39.0–52.0)
Hemoglobin: 7.6 g/dL — ABNORMAL LOW (ref 13.0–17.0)

## 2023-03-16 MED ORDER — PROCHLORPERAZINE EDISYLATE 10 MG/2ML IJ SOLN
5.0000 mg | Freq: Four times a day (QID) | INTRAMUSCULAR | Status: DC | PRN
  Administered 2023-03-17: 5 mg via INTRAVENOUS
  Filled 2023-03-16: qty 2

## 2023-03-16 MED ORDER — MELATONIN 5 MG PO TABS
5.0000 mg | ORAL_TABLET | Freq: Every evening | ORAL | Status: DC | PRN
  Filled 2023-03-16: qty 1

## 2023-03-16 MED ORDER — ACETAMINOPHEN 325 MG PO TABS: 650.0000 mg | ORAL_TABLET | Freq: Four times a day (QID) | ORAL | Status: DC | PRN

## 2023-03-16 MED ORDER — SODIUM CHLORIDE 0.9% IV SOLUTION: Freq: Once | INTRAVENOUS | Status: DC

## 2023-03-16 MED ORDER — PANTOPRAZOLE SODIUM 40 MG IV SOLR
40.0000 mg | Freq: Two times a day (BID) | INTRAVENOUS | Status: DC
  Administered 2023-03-17 – 2023-03-20 (×7): 40 mg via INTRAVENOUS
  Filled 2023-03-16 (×7): qty 10

## 2023-03-16 MED ORDER — PANTOPRAZOLE SODIUM 40 MG PO TBEC
40.0000 mg | DELAYED_RELEASE_TABLET | Freq: Two times a day (BID) | ORAL | Status: DC
  Administered 2023-03-16: 40 mg via ORAL
  Filled 2023-03-16: qty 1

## 2023-03-16 NOTE — ED Triage Notes (Signed)
Pt BIB EMS from [facility], C/o CP since 10am , given 3 nitroglycerin with no relief, last dose given at 5pm. Pt reports L sided CP that radiates down L arm, dizziness, and denies N/V/ShOB. States similar to past heart attacks just slightly less pain. Reports 1 cardiac stent placement in May 2024.

## 2023-03-16 NOTE — ED Provider Notes (Signed)
[facility] Provider Note   CSN: [id number] Arrival date & time: 03/16/23  1824     History  Chief Complaint  Patient presents with   Chest Pain    William Hunter is a 77 y.o. male.  HPI 77 year old male with past medical history of DVT on Eliquis, CAD, type 2 diabetes presenting for left-sided chest pain.  Patient states onset was around 10 AM this morning without any preceding trauma or injury.  He describes sharp pain in his left chest that radiates down his left arm.  He states this is similar to when he had a prior myocardial infarction, but not as severe.  Denies any exacerbating or relieving factors.  Nitroglycerin at his skilled nursing facility was administered without any benefit.  Patient denies any recent fevers, cough, congestion, shortness of breath, abdominal pain, pain with urination, urinary frequency.  He says that he is essentially bedbound from an unstable pelvis as well as T5 and other spinal issues.  He is currently residing in a nursing facility after his wife passed away he could not care for himself at home.    Home Medications Prior to Admission medications   Medication Sig Start Date End Date Taking? Authorizing Provider  amLODipine (NORVASC) 5 MG tablet Take 1 tablet (5 mg total) by mouth daily. 10/04/22   Lanae Boast, MD  apixaban (ELIQUIS) 5 MG TABS tablet Take 1 tablet (5 mg total) by mouth 2 (two) times daily. 10/03/22   Lanae Boast, MD  BD PEN NEEDLE MICRO U/F 32G X 6 MM MISC Inject into the skin 3 (three) times daily. 09/16/22   [provider]  BD PEN NEEDLE NANO 2ND GEN 32G X 4 MM MISC 1 each by Other route as directed. 06/07/22   [provider]  carbidopa-levodopa (SINEMET IR) 25-100 MG tablet Take 1 tablet by mouth 3 (three) times daily. 04/10/22   Levert Feinstein, MD  clopidogrel (PLAVIX) 75 MG tablet Take 1 tablet (75 mg total) by mouth daily with breakfast. 10/04/22   Lanae Boast, MD  Continuous  Glucose Sensor (FREESTYLE LIBRE 2 SENSOR) MISC Place onto the skin. 09/18/22   [provider]  diltiazem (TIADYLT ER) 300 MG 24 hr capsule Take 1 capsule (300 mg total) by mouth daily. Pt needs to keep upcoming appt for further refills. 12/13/22   Jake Bathe, MD  famotidine (PEPCID) 20 MG tablet Take 1 tablet (20 mg total) by mouth daily. 11/13/22   Rexford Maus, DO  folic acid (FOLVITE) 1 MG tablet Take 1 tablet (1 mg total) by mouth daily. 09/26/22   Rice, Jamesetta Orleans, MD  GVOKE HYPOPEN 2-PACK 1 MG/0.2ML SOAJ Inject 1 mg into the skin as needed (hypoglycemia). 04/11/22   [provider]  HUMIRA, 2 PEN, 40 MG/0.4ML PNKT Inject 0.4 mLs into the muscle every 14 (fourteen) days. 09/30/22   Fuller Plan, MD  hydroxychloroquine (PLAQUENIL) 200 MG tablet Take 1 tablet (200 mg total) by mouth daily. 09/26/22   Rice, Jamesetta Orleans, MD  insulin aspart (NOVOLOG) 100 UNIT/ML injection 0-5 Units, Subcutaneous, Daily at bedtime: HS scale CBG < 70: implement hypoglycemia measures CBG 70 - 120: 0 units CBG 121 - 150: 0 units CBG 151 - 200: 0 units CBG 201 - 250: 2 units CBG 251 - 300: 3 units CBG 301 - 350: 4 units CBG 351 - 400: 5 units CBG > 400: call MD and obtain STAT lab verification Patient not  taking: Reported on 11/14/2022 11/29/21   Maretta Bees, MD  insulin aspart (NOVOLOG) 100 UNIT/ML injection 0-15 Units, Subcutaneous, 3 times daily with meals CBG < 70: Implement Hypoglycemia Standing Orders and refer to Hypoglycemia Standing Orders sidebar report CBG 70 - 120: 0 units CBG 121 - 150: 2 units CBG 151 - 200: 3 units CBG 201 - 250: 5 units CBG 251 - 300: 8 units CBG 301 - 350: 11 units CBG 351 - 400: 15 units CBG > 400: call MD and obtain STAT lab verification 11/29/21   Maretta Bees, MD  isosorbide mononitrate (IMDUR) 30 MG 24 hr tablet Take 0.5 tablets (15 mg total) by mouth daily. Patient not taking: Reported on 11/14/2022 10/04/22   Lanae Boast, MD  meclizine (ANTIVERT)  25 MG tablet Take 25 mg by mouth 3 (three) times daily as needed for dizziness.    [provider]  methotrexate (RHEUMATREX) 2.5 MG tablet Take 6 tablets (15 mg total) by mouth once a week. Caution:Chemotherapy. Protect from light. Patient taking differently: Take 15 mg by mouth once a week. Tuesdays. Caution: Chemotherapy. Protect from light. 09/26/22   Rice, Jamesetta Orleans, MD  metoprolol succinate (TOPROL-XL) 25 MG 24 hr tablet Take 1 tablet (25 mg total) by mouth daily. 10/03/22   Lanae Boast, MD  MYRBETRIQ 25 MG TB24 tablet Take 25 mg by mouth daily. 09/12/22   [provider]  nitroGLYCERIN (NITROSTAT) 0.4 MG SL tablet Place 1 tablet (0.4 mg total) under the tongue every 5 (five) minutes x 3 doses as needed for chest pain. 10/03/22   Lanae Boast, MD  ondansetron (ZOFRAN) 4 MG tablet Take 1 tablet (4 mg total) by mouth every 6 (six) hours. 11/13/22   Elayne Snare K, DO  pantoprazole (PROTONIX) 40 MG tablet Take 1 tablet (40 mg total) by mouth daily at 6 (six) AM. 10/03/22   Lanae Boast, MD  Study - ORION 4 - inclisiran 300 mg/1.59mL or placebo SQ injection (PI-Stuckey) Inject 1.5 mLs (300 mg total) into the skin every 6 (six) months. Patient not taking: Reported on 09/26/2022 09/06/21   Herby Abraham, MD  sucralfate (CARAFATE) 1 g tablet Take 1 tablet (1 g total) by mouth 4 (four) times daily -  with meals and at bedtime. 11/08/22   Fayrene Helper, PA-C  TRESIBA FLEXTOUCH 200 UNIT/ML FlexTouch Pen Inject 20 Units into the skin at bedtime. 11/08/22   [provider]      Allergies    Codeine, Penicillins, Prednisone, Wool alcohol [lanolin], Empagliflozin, Statins, and Penicillin g    Review of Systems   Review of Systems  Physical Exam Updated Vital Signs BP 111/63 (BP Location: Right Arm)   Pulse (!) 52   Temp 98.6 F (37 C) (Oral)   Resp 16   Ht 5\' 10"  (1.778 m)   Wt 70.3 kg   SpO2 98%   BMI 22.24 kg/m  Physical Exam Constitutional:      General: He is not in  acute distress. HENT:     Head: Normocephalic and atraumatic.     Right Ear: External ear normal.     Left Ear: External ear normal.     Nose: Nose normal.     Mouth/Throat:     Mouth: Mucous membranes are moist.     Pharynx: Oropharynx is clear. No oropharyngeal exudate.  Eyes:     Extraocular Movements: Extraocular movements intact.     Conjunctiva/sclera: Conjunctivae normal.  Cardiovascular:  Rate and Rhythm: Normal rate and regular rhythm.     Pulses: Normal pulses.  Pulmonary:     Effort: Pulmonary effort is normal. No respiratory distress.     Breath sounds: No wheezing.  Abdominal:     General: Abdomen is flat.     Palpations: Abdomen is soft.     Tenderness: There is no abdominal tenderness. There is no guarding or rebound.  Musculoskeletal:        General: Normal range of motion.     Cervical back: No rigidity or tenderness.     Right lower leg: No edema.     Left lower leg: No edema.  Skin:    General: Skin is warm and dry.     Findings: Lesion present.     Comments: Several healing wounds BLE. No erythema, drainage  Neurological:     General: No focal deficit present.     Mental Status: He is alert.     Cranial Nerves: No cranial nerve deficit.     Sensory: No sensory deficit.     Motor: No weakness.     ED Results / Procedures / Treatments   Labs (all labs ordered are listed, but only abnormal results are displayed) Labs Reviewed  CBC WITH DIFFERENTIAL/PLATELET - Abnormal; Notable for the following components:      Result Value   RBC 2.71 (*)    Hemoglobin 8.2 (*)    HCT 25.0 (*)    All other components within normal limits  COMPREHENSIVE METABOLIC PANEL - Abnormal; Notable for the following components:   Glucose, Bld 129 (*)    Calcium 8.4 (*)    Albumin 2.7 (*)    Total Bilirubin 0.2 (*)    All other components within normal limits  POC OCCULT BLOOD, ED - Abnormal; Notable for the following components:   Fecal Occult Bld POSITIVE (*)    All  other components within normal limits  BRAIN NATRIURETIC PEPTIDE  IRON AND TIBC  FERRITIN  RETICULOCYTES  HEMOGLOBIN AND HEMATOCRIT, BLOOD  HEMOGLOBIN AND HEMATOCRIT, BLOOD  HEMOGLOBIN AND HEMATOCRIT, BLOOD  TROPONIN I (HIGH SENSITIVITY)  TROPONIN I (HIGH SENSITIVITY)    EKG EKG Interpretation Date/Time:  Sunday March 16 2023 18:42:33 EDT Ventricular Rate:  60 PR Interval:  209 QRS Duration:  87 QT Interval:  416 QTC Calculation: 416 R Axis:   57  Text Interpretation: Sinus rhythm Abnormal R-wave progression, early transition Confirmed by Eber Hong ([id number]) on 03/16/2023 6:50:05 PM  Radiology DG Chest Portable 1 View  Result Date: 03/16/2023 CLINICAL DATA:  Chest pain EXAM: PORTABLE CHEST 1 VIEW COMPARISON:  03/02/2023 FINDINGS: Stable cardiomediastinal silhouette. Aortic atherosclerotic calcification. Low lung volumes accentuate pulmonary vascularity. Chronic bronchitic changes. No focal consolidation, pleural effusion, or pneumothorax. No displaced rib fractures. Vertebroplasty of a midthoracic vertebral body. IMPRESSION: No active disease. Electronically Signed   By: Minerva Fester M.D.   On: 03/16/2023 20:21    Procedures Procedures    Medications Ordered in ED Medications  pantoprazole (PROTONIX) EC tablet 40 mg (40 mg Oral Given 03/16/23 2320)    ED Course/ Medical Decision Making/ A&P                                 Medical Decision Making Amount and/or Complexity of Data Reviewed Labs: ordered. Radiology: ordered.  Risk Decision regarding hospitalization.   Pt is a 77 y.o. male with pertinent PMHX DVT, CAD, type  2 diabetes who presents w/ left-sided chest pain with radiation to his left arm.  Because of the age and risk factors of the patient, ACS will be ruled out with troponin x 2.  Nitro was given by nursing care facility x 3 without benefit.  Will not repeat in the ED. Chest XR and EKG performed. EKG: Sinus rhythm with mildly prolonged PR interval.   No obvious ST segment elevations or depressions.  Low concern for acute ischemia.  Initial troponin 5, repeat also 5.  Unlikely PNA as CXR without focal consolidation, no leukocytosis, no cough, no fever. Unlikely PE as atypical presentation, not tachycardic, no oxygen requirement. Presentation not consistent with Aortic Dissection, Pancreatitis, Arrhythmia, Pneumothorax, Endo/Myo/Pericarditis, Shingles, Emergent complications of an Ulcer, Esophageal pathology, or other emergent pathology. Other differential considered includes: GERD, Gastritis, Trauma, Viral Infection, MSK Pain, Costochondritis, Chest wall pain, Myalgia, COPD, CHF, Biliary Disease  On lab work, patient has a hemoglobin of 8.2.  When compared to labs from just 2 weeks ago this is greater than 4 point hemoglobin drop.  Rectal exam does not show any acute bleed, but he is guaiac positive.  New GI bleed, will need admission to the hospital service.  Have discussed with the inpatient team who has agreed to admission.   The plan for this patient was discussed with Dr. Hyacinth Meeker, who voiced agreement and who oversaw evaluation and treatment of this patient.    Final Clinical Impression(s) / ED Diagnoses Final diagnoses:  Chest pain in adult  Gastrointestinal hemorrhage, unspecified gastrointestinal hemorrhage type    Rx / DC Orders ED Discharge Orders     None         Lyman Speller, MD 03/16/23 2322    Eber Hong, MD 03/17/23 [phone]

## 2023-03-16 NOTE — H&P (Addendum)
History and Physical  William Hunter QMV:[id number] DOB: 1945/06/20 DOA: 03/16/2023  Referring physician: Edmonia Caprio  PCP: Irena Reichmann, DO  Outpatient Specialists: Cardiology. Patient coming from: SNF.  Chief Complaint: Chest pain  HPI: William Hunter is a 77 y.o. male with medical history significant for LLE DVT on Eliquis, coronary artery disease on Plavix, type 2 diabetes, hypertension, GERD, rheumatoid arthritis, HFpEF with grade 1 diastolic dysfunction, presented from SNF with complaints of nonexertional left sided chest pain, onset this morning.  Described as sharp and radiates to his left arm.  The patient was given sublingual nitroglycerin in the skilled nursing facility without improvement.  EMS was activated.  In the ED, 12-lead EKG without evidence of acute ischemia.  High sensitivity troponin negative x 2.  Lab studies notable for drop in hemoglobin 8.2 from baseline of 12.  FOBT positive.  Repeated H&H 7.6.  1 unit PRBCs ordered to be transfused to maintain hemoglobin above 8.  Home DOAC and antiplatelet held.  TRH, hospitalist service, is asked to admit for further workup of symptomatic anemia.  ED Course: Temperature 98.6.  BP 111/63, pulse 59, respiratory rate 16, O2 saturation 98% on room air.  Lab studies notable for WBC 7.0, hemoglobin 8.2, repeat 7.6.  Serum glucose 129, high-sensitivity troponin 5, repeat 5.  Review of Systems: Review of systems as noted in the HPI. All other systems reviewed and are negative.   Past Medical History:  Diagnosis Date   Anginal pain ([name]    Arthritis    "fingers" (03/09/2012)   Cold feet    "from my diabetes; they stay cold" (03/09/2012)   Coronary artery disease    Coronary artery spasm [facility])    since age 61 Dr Aleen Campi   COVID 07/06/2019   hospitalizied   Dyslipidemia    Femoral DVT (deep venous thrombosis) ([name]    left   Headache(784.0)    Hx of gallstones    Dr Derrell Lolling lap cholecystectomy   Hx of plastic surgery     plastic surgery following a fall off a loading dock ,lost  all his upper teeth    Hypertension    MI, old    at age 58   Migraines    Pneumonia ~ 1962; 1970's   "double"; "regular" (03/09/2012)   Rheumatoid arthritis ([name]    Dr Dierdre Forth    Slipped cervical disc 06/10/2006   Dr Patsi Sears in the past/ Dr Larita Fife , chiropractor in 2008   Thyroid disease    Dr Lucianne Muss   Type II diabetes mellitus [facility])    Past Surgical History:  Procedure Laterality Date   CARDIAC CATHETERIZATION     x 3 with NO stents    CHOLECYSTECTOMY  03/11/2012   Procedure: LAPAROSCOPIC CHOLECYSTECTOMY WITH INTRAOPERATIVE CHOLANGIOGRAM;  Surgeon: Ernestene Mention, MD;  Location: [facility] OR;  Service: General;  Laterality: N/A;   CORONARY STENT INTERVENTION N/A 10/02/2022   Procedure: CORONARY STENT INTERVENTION;  Surgeon: Corky Crafts, MD;  Location: [facility] INVASIVE CV LAB;  Service: Cardiovascular;  Laterality: N/A;   CORONARY ULTRASOUND/IVUS N/A 10/02/2022   Procedure: Coronary Ultrasound/IVUS;  Surgeon: Corky Crafts, MD;  Location: [facility] INVASIVE CV LAB;  Service: Cardiovascular;  Laterality: N/A;   KYPHOPLASTY N/A 05/30/2021   Procedure: THORACIC FIVE KYPHOPLASTY;  Surgeon: Coletta Memos, MD;  Location: [facility] OR;  Service: Neurosurgery;  Laterality: N/A;  THORACIC FIVE KYPHOPLASTY   LEFT HEART CATH AND CORONARY ANGIOGRAPHY N/A 09/30/2022   Procedure: LEFT HEART CATH AND CORONARY  ANGIOGRAPHY;  Surgeon: Corky Crafts, MD;  Location: [facility] INVASIVE CV LAB;  Service: Cardiovascular;  Laterality: N/A;   PENILE PROSTHESIS IMPLANT  1983    Social History:  reports that he has never smoked. He has never been exposed to tobacco smoke. He has never used smokeless tobacco. He reports that he does not drink alcohol and does not use drugs.   Allergies  Allergen Reactions   Codeine Swelling and Rash   Penicillins Hives and Swelling    Has patient had a PCN reaction causing immediate rash, facial/tongue/throat  swelling, SOB or lightheadedness with hypotension: Yes Has patient had a PCN reaction causing severe rash involving mucus membranes or skin necrosis: No Has patient had a PCN reaction that required hospitalization No Has patient had a PCN reaction occurring within the last 10 years: No If all of the above answers are "NO", then may proceed with Cephalosporin use.    Prednisone Hives   Wool Alcohol [Lanolin] Hives   Empagliflozin Nausea And Vomiting   Statins Other (See Comments)    Myopathy    Penicillin G Rash    Family History  Problem Relation Age of Onset   Heart failure Mother    Cancer - Other Mother        breast   Heart disease Father    Cancer - Other Sister        tongue cancer   Heart attack Sister       Prior to Admission medications   Medication Sig Start Date End Date Taking? Authorizing Provider  amLODipine (NORVASC) 5 MG tablet Take 1 tablet (5 mg total) by mouth daily. 10/04/22   Lanae Boast, MD  apixaban (ELIQUIS) 5 MG TABS tablet Take 1 tablet (5 mg total) by mouth 2 (two) times daily. 10/03/22   Lanae Boast, MD  BD PEN NEEDLE MICRO U/F 32G X 6 MM MISC Inject into the skin 3 (three) times daily. 09/16/22   [provider]  BD PEN NEEDLE NANO 2ND GEN 32G X 4 MM MISC 1 each by Other route as directed. 06/07/22   [provider]  carbidopa-levodopa (SINEMET IR) 25-100 MG tablet Take 1 tablet by mouth 3 (three) times daily. 04/10/22   Levert Feinstein, MD  clopidogrel (PLAVIX) 75 MG tablet Take 1 tablet (75 mg total) by mouth daily with breakfast. 10/04/22   Lanae Boast, MD  Continuous Glucose Sensor (FREESTYLE LIBRE 2 SENSOR) MISC Place onto the skin. 09/18/22   [provider]  diltiazem (TIADYLT ER) 300 MG 24 hr capsule Take 1 capsule (300 mg total) by mouth daily. Pt needs to keep upcoming appt for further refills. 12/13/22   Jake Bathe, MD  famotidine (PEPCID) 20 MG tablet Take 1 tablet (20 mg total) by mouth daily. 11/13/22   Rexford Maus,  DO  folic acid (FOLVITE) 1 MG tablet Take 1 tablet (1 mg total) by mouth daily. 09/26/22   Rice, Jamesetta Orleans, MD  GVOKE HYPOPEN 2-PACK 1 MG/0.2ML SOAJ Inject 1 mg into the skin as needed (hypoglycemia). 04/11/22   [provider]  HUMIRA, 2 PEN, 40 MG/0.4ML PNKT Inject 0.4 mLs into the muscle every 14 (fourteen) days. 09/30/22   Fuller Plan, MD  hydroxychloroquine (PLAQUENIL) 200 MG tablet Take 1 tablet (200 mg total) by mouth daily. 09/26/22   Rice, Jamesetta Orleans, MD  insulin aspart (NOVOLOG) 100 UNIT/ML injection 0-5 Units, Subcutaneous, Daily at bedtime: HS scale CBG < 70: implement hypoglycemia measures CBG 70 -  120: 0 units CBG 121 - 150: 0 units CBG 151 - 200: 0 units CBG 201 - 250: 2 units CBG 251 - 300: 3 units CBG 301 - 350: 4 units CBG 351 - 400: 5 units CBG > 400: call MD and obtain STAT lab verification Patient not taking: Reported on 11/14/2022 11/29/21   Maretta Bees, MD  insulin aspart (NOVOLOG) 100 UNIT/ML injection 0-15 Units, Subcutaneous, 3 times daily with meals CBG < 70: Implement Hypoglycemia Standing Orders and refer to Hypoglycemia Standing Orders sidebar report CBG 70 - 120: 0 units CBG 121 - 150: 2 units CBG 151 - 200: 3 units CBG 201 - 250: 5 units CBG 251 - 300: 8 units CBG 301 - 350: 11 units CBG 351 - 400: 15 units CBG > 400: call MD and obtain STAT lab verification 11/29/21   Maretta Bees, MD  isosorbide mononitrate (IMDUR) 30 MG 24 hr tablet Take 0.5 tablets (15 mg total) by mouth daily. Patient not taking: Reported on 11/14/2022 10/04/22   Lanae Boast, MD  meclizine (ANTIVERT) 25 MG tablet Take 25 mg by mouth 3 (three) times daily as needed for dizziness.    [provider]  methotrexate (RHEUMATREX) 2.5 MG tablet Take 6 tablets (15 mg total) by mouth once a week. Caution:Chemotherapy. Protect from light. Patient taking differently: Take 15 mg by mouth once a week. Tuesdays. Caution: Chemotherapy. Protect from light. 09/26/22   Rice, Jamesetta Orleans, MD  metoprolol succinate (TOPROL-XL) 25 MG 24 hr tablet Take 1 tablet (25 mg total) by mouth daily. 10/03/22   Lanae Boast, MD  MYRBETRIQ 25 MG TB24 tablet Take 25 mg by mouth daily. 09/12/22   [provider]  nitroGLYCERIN (NITROSTAT) 0.4 MG SL tablet Place 1 tablet (0.4 mg total) under the tongue every 5 (five) minutes x 3 doses as needed for chest pain. 10/03/22   Lanae Boast, MD  ondansetron (ZOFRAN) 4 MG tablet Take 1 tablet (4 mg total) by mouth every 6 (six) hours. 11/13/22   Elayne Snare K, DO  pantoprazole (PROTONIX) 40 MG tablet Take 1 tablet (40 mg total) by mouth daily at 6 (six) AM. 10/03/22   Lanae Boast, MD  Study - ORION 4 - inclisiran 300 mg/1.44mL or placebo SQ injection (PI-Stuckey) Inject 1.5 mLs (300 mg total) into the skin every 6 (six) months. Patient not taking: Reported on 09/26/2022 09/06/21   Herby Abraham, MD  sucralfate (CARAFATE) 1 g tablet Take 1 tablet (1 g total) by mouth 4 (four) times daily -  with meals and at bedtime. 11/08/22   Fayrene Helper, PA-C  TRESIBA FLEXTOUCH 200 UNIT/ML FlexTouch Pen Inject 20 Units into the skin at bedtime. 11/08/22   [provider]    Physical Exam: BP 111/63 (BP Location: Right Arm)   Pulse (!) 52   Temp 98.6 F (37 C) (Oral)   Resp 16   Ht 5\' 10"  (1.778 m)   Wt 70.3 kg   SpO2 98%   BMI 22.24 kg/m   General: 77 y.o. year-old male well developed well nourished in no acute distress.  Alert and oriented x3. Cardiovascular: Regular rate and rhythm with no rubs or gallops.  No thyromegaly or JVD noted.  No lower extremity edema. 2/4 pulses in all 4 extremities. Chest wall tenderness with palpation. Respiratory: Clear to auscultation with no wheezes or rales. Good inspiratory effort. Abdomen: Soft nontender nondistended with normal bowel sounds x4 quadrants. Muskuloskeletal: No cyanosis, clubbing or edema noted  bilaterally Neuro: CN II-XII intact, strength, sensation, reflexes Skin: No ulcerative lesions noted  or rashes Psychiatry: Judgement and insight appear normal. Mood is appropriate for condition and setting          Labs on Admission:  Basic Metabolic Panel: Recent Labs  Lab 03/16/23 1856  NA 137  K 3.6  CL 102  CO2 22  GLUCOSE 129*  BUN 15  CREATININE 0.79  CALCIUM 8.4*   Liver Function Tests: Recent Labs  Lab 03/16/23 1856  AST 19  ALT 5  ALKPHOS 104  BILITOT 0.2*  PROT 6.5  ALBUMIN 2.7*   No results for input(s): "LIPASE", "AMYLASE" in the last 168 hours. No results for input(s): "AMMONIA" in the last 168 hours. CBC: Recent Labs  Lab 03/16/23 1856 03/16/23 2325  WBC 7.0  --   NEUTROABS 4.7  --   HGB 8.2* 7.6*  HCT 25.0* 22.9*  MCV 92.3  --   PLT 241  --    Cardiac Enzymes: No results for input(s): "CKTOTAL", "CKMB", "CKMBINDEX", "TROPONINI" in the last 168 hours.  BNP (last 3 results) Recent Labs    09/29/22 1926 03/16/23 1834  BNP 195.4* 46.3    ProBNP (last 3 results) No results for input(s): "PROBNP" in the last 8760 hours.  CBG: No results for input(s): "GLUCAP" in the last 168 hours.  Radiological Exams on Admission: DG Chest Portable 1 View  Result Date: 03/16/2023 CLINICAL DATA:  Chest pain EXAM: PORTABLE CHEST 1 VIEW COMPARISON:  03/02/2023 FINDINGS: Stable cardiomediastinal silhouette. Aortic atherosclerotic calcification. Low lung volumes accentuate pulmonary vascularity. Chronic bronchitic changes. No focal consolidation, pleural effusion, or pneumothorax. No displaced rib fractures. Vertebroplasty of a midthoracic vertebral body. IMPRESSION: No active disease. Electronically Signed   By: Minerva Fester M.D.   On: 03/16/2023 20:21    EKG: I independently viewed the EKG done and my findings are as followed: Sinus rhythm rate of 60.  Nonspecific ST-T changes.  QTc 416.  Assessment/Plan Present on Admission:  Symptomatic anemia  Principal Problem:   Symptomatic anemia  Symptomatic anemia, acute blood loss anemia with positive  FOBT Presented with atypical chest pain, reproducible on palpation.   Last 2D echo done on 09/30/2022 due to chest pain revealed LVEF 60-65% with no regional wall motion abnormalities and grade 1 diastolic dysfunction. Baseline hemoglobin appears to be 12 Presented with hemoglobin of 8.2, repeat 7.6. 1 unit PRBCs ordered to be transfused to maintain hemoglobin above 8.0.  The patient consented at bedside. Serial H&H every 6 hours x 4. Positive FOBT Started IV Protonix 40 mg twice daily [facility], Dr. Leonides Schanz, consulted. N.p.o. after midnight until seen by GI. Obtain INR Maintain MAP greater than 65 Gentle IV fluid hydration.  Atypical Chest pain, rule out ACS Sharp and reproducible on palpation. High-sensitivity troponin 5, repeat 5 Twelve-lead EKG with no evidence of acute ischemia Suspect symptomatic anemia Closely monitor on telemetry.  History of left lower extremity DVT (11/27/2021) on Eliquis Last CT angio chest PE done on 09/29/2022 was negative for pulmonary embolism. Obtain bilateral lower extremity Doppler ultrasound to rule out DVT Continue to hold off home Eliquis due to suspected upper GI bleed with acute blood loss anemia and positive FOBT.  Coronary artery disease on Plavix Continue to hold off home Plavix due to suspected upper GI bleed and possible procedure with GI.  GERD with concern for esophagitis seen on CT scan done on 09/29/2022 Continue IV PPI twice daily  Type 2 diabetes with hyperglycemia  Last hemoglobin A1c 6.7 on 09/29/2022 Insulin sliding scale every 4 hours while NPO.  Rheumatoid arthritis Hold off home regimen for now  Hypertension BPs are soft Hold off home oral antihypertensives Closely monitor vital signs Maintain MAP greater than 65.   Critical care: 75 minutes.   DVT prophylaxis: SCDs, chemical DVT prophylaxis contraindicated in the setting of suspected upper GI bleed.  Code Status: DNR.  Patient has a DNR form, he is alert oriented  x 4.  Family Communication: None at bedside.  Disposition Plan: Admitted to progressive care unit.  Consults called: [facility].  Admission status: Inpatient status.   Status is: Inpatient. The patient requires at least 2 midnights for further evaluation and treatment of present condition.   Darlin Drop MD [facility] Pager [phone]  If 7PM-7AM, please contact night-coverage [email or web address] Password [email or web address]  03/16/2023, 11:48 PM

## 2023-03-16 NOTE — H&P (Incomplete)
History and Physical  WILLMON BATHURST OZD:[id number] DOB: [birth year] DOA: 03/16/2023  Referring physician: Edmonia Caprio  PCP: Irena Reichmann, DO  Outpatient Specialists: Cardiology. Patient coming from: SNF.  Chief Complaint: Chest pain  HPI: William Hunter is a 77 y.o. male with medical history significant for pulmonary embolism on Eliquis, coronary artery disease on Plavix, type 2 diabetes, hypertension, GERD, rheumatoid arthritis, HFpEF with grade 1 diastolic dysfunction, presented from SNF with complaints of nonexertional left sided chest pain, onset this morning.  Described as sharp in the left chest that radiates to his left arm.  The patient was given sublingual nitroglycerin in the skilled nursing facility without improvement.  EMS was activated.  In the ED, 12-lead EKG without evidence of acute ischemia.  High sensitivity troponin negative x 2.  Lab studies notable for drop in hemoglobin 8.2 from baseline of 12.  FOBT positive.  Repeated H&H 7.6.  1 unit PRBC ordered to be transfused to maintain hemoglobin above 8.  Home DOAC and antiplatelet held.  TRH, hospitalist service is asked to admit for further workup of symptomatic anemia.  ED Course: Temperature 98.6.  BP 111/63, pulse 59, respiratory rate 16, O2 saturation 98% on room air.  Lab studies notable for WBC 7.0, hemoglobin 8.2, repeat 7.6.  Serum glucose 129, high-sensitivity troponin 5, repeat 5.  Review of Systems: Review of systems as noted in the HPI. All other systems reviewed and are negative.   Past Medical History:  Diagnosis Date  . Anginal pain ([name]   . Arthritis    "fingers" (03/09/2012)  . Cold feet    "from my diabetes; they stay cold" (03/09/2012)  . Coronary artery disease   . Coronary artery spasm [facility])    since age 55 Dr Aleen Campi  . COVID 07/06/2019   hospitalizied  . Dyslipidemia   . Femoral DVT (deep venous thrombosis) ([name]    left  . Headache(784.0)   . Hx of gallstones    Dr Derrell Lolling lap  cholecystectomy  . Hx of plastic surgery    plastic surgery following a fall off a loading dock ,lost  all his upper teeth   . Hypertension   . MI, old    at age 54  . Migraines   . Pneumonia ~ 1962; 1970's   "double"; "regular" (03/09/2012)  . Rheumatoid arthritis ([name]    Dr Dierdre Forth   . Slipped cervical disc 06/10/2006   Dr Patsi Sears in the past/ Dr Larita Fife , chiropractor in 2008  . Thyroid disease    Dr Lucianne Muss  . Type II diabetes mellitus ([name]    Past Surgical History:  Procedure Laterality Date  . CARDIAC CATHETERIZATION     x 3 with NO stents   . CHOLECYSTECTOMY  03/11/2012   Procedure: LAPAROSCOPIC CHOLECYSTECTOMY WITH INTRAOPERATIVE CHOLANGIOGRAM;  Surgeon: Ernestene Mention, MD;  Location: [facility] OR;  Service: General;  Laterality: N/A;  . CORONARY STENT INTERVENTION N/A 10/02/2022   Procedure: CORONARY STENT INTERVENTION;  Surgeon: Corky Crafts, MD;  Location: [facility] INVASIVE CV LAB;  Service: Cardiovascular;  Laterality: N/A;  . CORONARY ULTRASOUND/IVUS N/A 10/02/2022   Procedure: Coronary Ultrasound/IVUS;  Surgeon: Corky Crafts, MD;  Location: [facility] INVASIVE CV LAB;  Service: Cardiovascular;  Laterality: N/A;  . KYPHOPLASTY N/A 05/30/2021   Procedure: THORACIC FIVE KYPHOPLASTY;  Surgeon: Coletta Memos, MD;  Location: [facility] OR;  Service: Neurosurgery;  Laterality: N/A;  THORACIC FIVE KYPHOPLASTY  . LEFT HEART CATH AND CORONARY ANGIOGRAPHY N/A 09/30/2022   Procedure: LEFT  HEART CATH AND CORONARY ANGIOGRAPHY;  Surgeon: Corky Crafts, MD;  Location: [facility] INVASIVE CV LAB;  Service: Cardiovascular;  Laterality: N/A;  . PENILE PROSTHESIS IMPLANT  1983    Social History:  reports that he has never smoked. He has never been exposed to tobacco smoke. He has never used smokeless tobacco. He reports that he does not drink alcohol and does not use drugs.   Allergies  Allergen Reactions  . Codeine Swelling and Rash  . Penicillins Hives and Swelling    Has patient had a PCN  reaction causing immediate rash, facial/tongue/throat swelling, SOB or lightheadedness with hypotension: Yes Has patient had a PCN reaction causing severe rash involving mucus membranes or skin necrosis: No Has patient had a PCN reaction that required hospitalization No Has patient had a PCN reaction occurring within the last 10 years: No If all of the above answers are "NO", then may proceed with Cephalosporin use.   . Prednisone Hives  . Wool Alcohol [Lanolin] Hives  . Empagliflozin Nausea And Vomiting  . Statins Other (See Comments)    Myopathy   . Penicillin G Rash    Family History  Problem Relation Age of Onset  . Heart failure Mother   . Cancer - Other Mother        breast  . Heart disease Father   . Cancer - Other Sister        tongue cancer  . Heart attack Sister       Prior to Admission medications   Medication Sig Start Date End Date Taking? Authorizing Provider  amLODipine (NORVASC) 5 MG tablet Take 1 tablet (5 mg total) by mouth daily. 10/04/22   Lanae Boast, MD  apixaban (ELIQUIS) 5 MG TABS tablet Take 1 tablet (5 mg total) by mouth 2 (two) times daily. 10/03/22   Lanae Boast, MD  BD PEN NEEDLE MICRO U/F 32G X 6 MM MISC Inject into the skin 3 (three) times daily. 09/16/22   [provider]  BD PEN NEEDLE NANO 2ND GEN 32G X 4 MM MISC 1 each by Other route as directed. 06/07/22   [provider]  carbidopa-levodopa (SINEMET IR) 25-100 MG tablet Take 1 tablet by mouth 3 (three) times daily. 04/10/22   Levert Feinstein, MD  clopidogrel (PLAVIX) 75 MG tablet Take 1 tablet (75 mg total) by mouth daily with breakfast. 10/04/22   Lanae Boast, MD  Continuous Glucose Sensor (FREESTYLE LIBRE 2 SENSOR) MISC Place onto the skin. 09/18/22   [provider]  diltiazem (TIADYLT ER) 300 MG 24 hr capsule Take 1 capsule (300 mg total) by mouth daily. Pt needs to keep upcoming appt for further refills. 12/13/22   Jake Bathe, MD  famotidine (PEPCID) 20 MG tablet Take 1  tablet (20 mg total) by mouth daily. 11/13/22   Rexford Maus, DO  folic acid (FOLVITE) 1 MG tablet Take 1 tablet (1 mg total) by mouth daily. 09/26/22   Rice, Jamesetta Orleans, MD  GVOKE HYPOPEN 2-PACK 1 MG/0.2ML SOAJ Inject 1 mg into the skin as needed (hypoglycemia). 04/11/22   [provider]  HUMIRA, 2 PEN, 40 MG/0.4ML PNKT Inject 0.4 mLs into the muscle every 14 (fourteen) days. 09/30/22   Fuller Plan, MD  hydroxychloroquine (PLAQUENIL) 200 MG tablet Take 1 tablet (200 mg total) by mouth daily. 09/26/22   Rice, Jamesetta Orleans, MD  insulin aspart (NOVOLOG) 100 UNIT/ML injection 0-5 Units, Subcutaneous, Daily at bedtime: HS scale CBG < 70: implement hypoglycemia  measures CBG 70 - 120: 0 units CBG 121 - 150: 0 units CBG 151 - 200: 0 units CBG 201 - 250: 2 units CBG 251 - 300: 3 units CBG 301 - 350: 4 units CBG 351 - 400: 5 units CBG > 400: call MD and obtain STAT lab verification Patient not taking: Reported on 11/14/2022 11/29/21   Maretta Bees, MD  insulin aspart (NOVOLOG) 100 UNIT/ML injection 0-15 Units, Subcutaneous, 3 times daily with meals CBG < 70: Implement Hypoglycemia Standing Orders and refer to Hypoglycemia Standing Orders sidebar report CBG 70 - 120: 0 units CBG 121 - 150: 2 units CBG 151 - 200: 3 units CBG 201 - 250: 5 units CBG 251 - 300: 8 units CBG 301 - 350: 11 units CBG 351 - 400: 15 units CBG > 400: call MD and obtain STAT lab verification 11/29/21   Maretta Bees, MD  isosorbide mononitrate (IMDUR) 30 MG 24 hr tablet Take 0.5 tablets (15 mg total) by mouth daily. Patient not taking: Reported on 11/14/2022 10/04/22   Lanae Boast, MD  meclizine (ANTIVERT) 25 MG tablet Take 25 mg by mouth 3 (three) times daily as needed for dizziness.    [provider]  methotrexate (RHEUMATREX) 2.5 MG tablet Take 6 tablets (15 mg total) by mouth once a week. Caution:Chemotherapy. Protect from light. Patient taking differently: Take 15 mg by mouth once a week. Tuesdays.  Caution: Chemotherapy. Protect from light. 09/26/22   Rice, Jamesetta Orleans, MD  metoprolol succinate (TOPROL-XL) 25 MG 24 hr tablet Take 1 tablet (25 mg total) by mouth daily. 10/03/22   Lanae Boast, MD  MYRBETRIQ 25 MG TB24 tablet Take 25 mg by mouth daily. 09/12/22   [provider]  nitroGLYCERIN (NITROSTAT) 0.4 MG SL tablet Place 1 tablet (0.4 mg total) under the tongue every 5 (five) minutes x 3 doses as needed for chest pain. 10/03/22   Lanae Boast, MD  ondansetron (ZOFRAN) 4 MG tablet Take 1 tablet (4 mg total) by mouth every 6 (six) hours. 11/13/22   Elayne Snare K, DO  pantoprazole (PROTONIX) 40 MG tablet Take 1 tablet (40 mg total) by mouth daily at 6 (six) AM. 10/03/22   Lanae Boast, MD  Study - ORION 4 - inclisiran 300 mg/1.87mL or placebo SQ injection (PI-Stuckey) Inject 1.5 mLs (300 mg total) into the skin every 6 (six) months. Patient not taking: Reported on 09/26/2022 09/06/21   Herby Abraham, MD  sucralfate (CARAFATE) 1 g tablet Take 1 tablet (1 g total) by mouth 4 (four) times daily -  with meals and at bedtime. 11/08/22   Fayrene Helper, PA-C  TRESIBA FLEXTOUCH 200 UNIT/ML FlexTouch Pen Inject 20 Units into the skin at bedtime. 11/08/22   [provider]    Physical Exam: BP 111/63 (BP Location: Right Arm)   Pulse (!) 52   Temp 98.6 F (37 C) (Oral)   Resp 16   Ht 5\' 10"  (1.778 m)   Wt 70.3 kg   SpO2 98%   BMI 22.24 kg/m   General: 77 y.o. year-old male well developed well nourished in no acute distress.  Alert and oriented x3. Cardiovascular: Regular rate and rhythm with no rubs or gallops.  No thyromegaly or JVD noted.  No lower extremity edema. 2/4 pulses in all 4 extremities. Respiratory: Clear to auscultation with no wheezes or rales. Good inspiratory effort. Abdomen: Soft nontender nondistended with normal bowel sounds x4 quadrants. Muskuloskeletal: No cyanosis, clubbing or edema noted bilaterally  Neuro: CN II-XII intact, strength, sensation,  reflexes Skin: No ulcerative lesions noted or rashes Psychiatry: Judgement and insight appear normal. Mood is appropriate for condition and setting          Labs on Admission:  Basic Metabolic Panel: Recent Labs  Lab 03/16/23 1856  NA 137  K 3.6  CL 102  CO2 22  GLUCOSE 129*  BUN 15  CREATININE 0.79  CALCIUM 8.4*   Liver Function Tests: Recent Labs  Lab 03/16/23 1856  AST 19  ALT 5  ALKPHOS 104  BILITOT 0.2*  PROT 6.5  ALBUMIN 2.7*   No results for input(s): "LIPASE", "AMYLASE" in the last 168 hours. No results for input(s): "AMMONIA" in the last 168 hours. CBC: Recent Labs  Lab 03/16/23 1856 03/16/23 2325  WBC 7.0  --   NEUTROABS 4.7  --   HGB 8.2* 7.6*  HCT 25.0* 22.9*  MCV 92.3  --   PLT 241  --    Cardiac Enzymes: No results for input(s): "CKTOTAL", "CKMB", "CKMBINDEX", "TROPONINI" in the last 168 hours.  BNP (last 3 results) Recent Labs    09/29/22 1926 03/16/23 1834  BNP 195.4* 46.3    ProBNP (last 3 results) No results for input(s): "PROBNP" in the last 8760 hours.  CBG: No results for input(s): "GLUCAP" in the last 168 hours.  Radiological Exams on Admission: DG Chest Portable 1 View  Result Date: 03/16/2023 CLINICAL DATA:  Chest pain EXAM: PORTABLE CHEST 1 VIEW COMPARISON:  03/02/2023 FINDINGS: Stable cardiomediastinal silhouette. Aortic atherosclerotic calcification. Low lung volumes accentuate pulmonary vascularity. Chronic bronchitic changes. No focal consolidation, pleural effusion, or pneumothorax. No displaced rib fractures. Vertebroplasty of a midthoracic vertebral body. IMPRESSION: No active disease. Electronically Signed   By: Minerva Fester M.D.   On: 03/16/2023 20:21    EKG: I independently viewed the EKG done and my findings are as followed: Sinus rhythm rate of 60.  Nonspecific ST-T changes.  QTc 416.  Assessment/Plan Present on Admission: . Symptomatic anemia  Principal Problem:   Symptomatic anemia  Symptomatic  anemia, acute blood loss anemia with positive FOBT Presented with chest pain, ACS was ruled out. Baseline hemoglobin appears to be 12 Presented with hemoglobin of 8.2, repeat 7.6. 1 unit PRBCs ordered to be transfused to maintain hemoglobin above 8.0. Serial H&H every 6 hours x 4. Positive FOBT Started IV Protonix 40 mg twice daily [facility] [place], Dr. Leonides Schanz consulted. N.p.o. after midnight until seen by GI.  Chest pain, rule out ACS High-sensitivity troponin 5, repeat 5 Twelve-lead EKG with no evidence of acute ischemia Suspect    DVT prophylaxis: ***   Code Status: ***   Family Communication: ***   Disposition Plan: ***   Consults called: ***   Admission status: ***    Status is: Observation {Observation:23811}   Darlin Drop MD [facility] Pager [phone]  If 7PM-7AM, please contact night-coverage [email or web address] Password [email or web address]  03/16/2023, 11:48 PM

## 2023-03-17 ENCOUNTER — Inpatient Hospital Stay (HOSPITAL_BASED_OUTPATIENT_CLINIC_OR_DEPARTMENT_OTHER): Payer: Medicare HMO

## 2023-03-17 DIAGNOSIS — Z86718 Personal history of other venous thrombosis and embolism: Secondary | ICD-10-CM

## 2023-03-17 DIAGNOSIS — D649 Anemia, unspecified: Secondary | ICD-10-CM | POA: Diagnosis not present

## 2023-03-17 DIAGNOSIS — R195 Other fecal abnormalities: Secondary | ICD-10-CM | POA: Diagnosis not present

## 2023-03-17 DIAGNOSIS — K921 Melena: Secondary | ICD-10-CM | POA: Diagnosis present

## 2023-03-17 LAB — BASIC METABOLIC PANEL
Anion gap: 13 (ref 5–15)
BUN: 13 mg/dL (ref 8–23)
CO2: 23 mmol/L (ref 22–32)
Calcium: 8.5 mg/dL — ABNORMAL LOW (ref 8.9–10.3)
Chloride: 100 mmol/L (ref 98–111)
Creatinine, Ser: 0.66 mg/dL (ref 0.61–1.24)
GFR, Estimated: >60 mL/min (ref >60)
Glucose, Bld: 121 mg/dL — ABNORMAL HIGH (ref 70–99)
Potassium: 3.2 mmol/L — ABNORMAL LOW (ref 3.5–5.1)
Sodium: 136 mmol/L (ref 135–145)

## 2023-03-17 LAB — PROTIME-INR
INR: 1.5 — ABNORMAL HIGH (ref 0.8–1.2)
Prothrombin Time: 17.9 s — ABNORMAL HIGH (ref 11.4–15.2)

## 2023-03-17 LAB — CBG MONITORING, ED
Glucose-Capillary: 102 mg/dL — ABNORMAL HIGH (ref 70–99)
Glucose-Capillary: 116 mg/dL — ABNORMAL HIGH (ref 70–99)
Glucose-Capillary: 136 mg/dL — ABNORMAL HIGH (ref 70–99)
Glucose-Capillary: 137 mg/dL — ABNORMAL HIGH (ref 70–99)
Glucose-Capillary: 184 mg/dL — ABNORMAL HIGH (ref 70–99)
Glucose-Capillary: 97 mg/dL (ref 70–99)

## 2023-03-17 LAB — CBC
HCT: 32 % — ABNORMAL LOW (ref 39.0–52.0)
HCT: 31 % — ABNORMAL LOW (ref 39.0–52.0)
Hemoglobin: 10.7 g/dL — ABNORMAL LOW (ref 13.0–17.0)
Hemoglobin: 10.1 g/dL — ABNORMAL LOW (ref 13.0–17.0)
MCH: 30.2 pg (ref 26.0–34.0)
MCH: 29.6 pg (ref 26.0–34.0)
MCHC: 33.4 g/dL (ref 30.0–36.0)
MCHC: 32.6 g/dL (ref 30.0–36.0)
MCV: 90.4 fL (ref 80.0–100.0)
MCV: 90.9 fL (ref 80.0–100.0)
Platelets: 263 10*3/uL (ref 150–400)
Platelets: 256 10*3/uL (ref 150–400)
RBC: 3.54 MIL/uL — ABNORMAL LOW (ref 4.22–5.81)
RBC: 3.41 MIL/uL — ABNORMAL LOW (ref 4.22–5.81)
RDW: 14 % (ref 11.5–15.5)
RDW: 14 % (ref 11.5–15.5)
WBC: 8.9 10*3/uL (ref 4.0–10.5)
WBC: 8.3 10*3/uL (ref 4.0–10.5)
nRBC: 0 % (ref 0.0–0.2)
nRBC: 0 % (ref 0.0–0.2)

## 2023-03-17 LAB — IRON AND TIBC
Iron: 42 ug/dL — ABNORMAL LOW (ref 45–182)
Saturation Ratios: 15 % — ABNORMAL LOW (ref 17.9–39.5)
TIBC: 277 ug/dL (ref 250–450)
UIBC: 235 ug/dL

## 2023-03-17 LAB — ABO/RH: ABO/RH(D): A POS

## 2023-03-17 LAB — GLUCOSE, CAPILLARY: Glucose-Capillary: 100 mg/dL — ABNORMAL HIGH (ref 70–99)

## 2023-03-17 LAB — MAGNESIUM: Magnesium: 1.7 mg/dL (ref 1.7–2.4)

## 2023-03-17 LAB — FERRITIN: Ferritin: 106 ng/mL (ref 24–336)

## 2023-03-17 LAB — PHOSPHORUS: Phosphorus: 3 mg/dL (ref 2.5–4.6)

## 2023-03-17 MED ORDER — IPRATROPIUM-ALBUTEROL 0.5-2.5 (3) MG/3ML IN SOLN: 3.0000 mL | RESPIRATORY_TRACT | Status: DC | PRN

## 2023-03-17 MED ORDER — POTASSIUM CHLORIDE CRYS ER 20 MEQ PO TBCR
40.0000 meq | EXTENDED_RELEASE_TABLET | Freq: Once | ORAL | Status: AC
  Administered 2023-03-17: 40 meq via ORAL
  Filled 2023-03-17: qty 2

## 2023-03-17 MED ORDER — POTASSIUM CHLORIDE 10 MEQ/100ML IV SOLN
10.0000 meq | INTRAVENOUS | Status: DC
  Administered 2023-03-17 (×2): 10 meq via INTRAVENOUS
  Filled 2023-03-17 (×2): qty 100

## 2023-03-17 MED ORDER — TRAZODONE HCL 50 MG PO TABS: 50.0000 mg | ORAL_TABLET | Freq: Every evening | ORAL | Status: DC | PRN

## 2023-03-17 MED ORDER — METOPROLOL TARTRATE 5 MG/5ML IV SOLN: 5.0000 mg | INTRAVENOUS | Status: DC | PRN

## 2023-03-17 MED ORDER — INSULIN ASPART 100 UNIT/ML IJ SOLN
0.0000 [IU] | INTRAMUSCULAR | Status: DC
  Administered 2023-03-17: 1 [IU] via SUBCUTANEOUS
  Administered 2023-03-17: 2 [IU] via SUBCUTANEOUS
  Administered 2023-03-18: 1 [IU] via SUBCUTANEOUS
  Administered 2023-03-19: 2 [IU] via SUBCUTANEOUS
  Administered 2023-03-20: 1 [IU] via SUBCUTANEOUS

## 2023-03-17 MED ORDER — GUAIFENESIN 100 MG/5ML PO LIQD: 5.0000 mL | ORAL | Status: DC | PRN

## 2023-03-17 MED ORDER — BOOST / RESOURCE BREEZE PO LIQD CUSTOM: 1.0000 | Freq: Three times a day (TID) | ORAL | Status: DC

## 2023-03-17 MED ORDER — MAGNESIUM OXIDE -MG SUPPLEMENT 400 (240 MG) MG PO TABS
800.0000 mg | ORAL_TABLET | Freq: Once | ORAL | Status: AC
  Administered 2023-03-17: 800 mg via ORAL
  Filled 2023-03-17: qty 2

## 2023-03-17 MED ORDER — HYDRALAZINE HCL 20 MG/ML IJ SOLN: 10.0000 mg | INTRAMUSCULAR | Status: DC | PRN

## 2023-03-17 MED ORDER — SODIUM CHLORIDE 0.9 % IV SOLN: INTRAVENOUS | Status: AC

## 2023-03-17 MED ORDER — PEG 3350-KCL-NA BICARB-NACL 420 G PO SOLR
4000.0000 mL | Freq: Once | ORAL | Status: AC
  Administered 2023-03-18: 4000 mL via ORAL
  Filled 2023-03-17: qty 4000

## 2023-03-17 MED ORDER — SENNOSIDES-DOCUSATE SODIUM 8.6-50 MG PO TABS: 1.0000 | ORAL_TABLET | Freq: Every evening | ORAL | Status: DC | PRN

## 2023-03-17 MED ORDER — ONDANSETRON HCL 4 MG/2ML IJ SOLN: 4.0000 mg | Freq: Four times a day (QID) | INTRAMUSCULAR | Status: DC | PRN

## 2023-03-17 NOTE — ED Notes (Signed)
ED TO INPATIENT HANDOFF REPORT  ED Nurse Name and Phone #: [phone]  S Name/Age/Gender Gwenlyn Found 77 y.o. male Room/Bed: 039C/039C  Code Status   Code Status: Full Code  Home/SNF/Other Home Patient oriented to: self, place, time, and situation Is this baseline? Yes   Triage Complete: Triage complete  Chief Complaint Symptomatic anemia [D64.9] Melena [K92.1]  Triage Note Pt BIB EMS from [facility], C/o CP since 10am , given 3 nitroglycerin with no relief, last dose given at 5pm. Pt reports L sided CP that radiates down L arm, dizziness, and denies N/V/ShOB. States similar to past heart attacks just slightly less pain. Reports 1 cardiac stent placement in May 2024.   Allergies Allergies  Allergen Reactions   Codeine Swelling and Rash   Penicillins Hives and Swelling    Has patient had a PCN reaction causing immediate rash, facial/tongue/throat swelling, SOB or lightheadedness with hypotension: Yes Has patient had a PCN reaction causing severe rash involving mucus membranes or skin necrosis: No Has patient had a PCN reaction that required hospitalization No Has patient had a PCN reaction occurring within the last 10 years: No If all of the above answers are "NO", then may proceed with Cephalosporin use.    Prednisone Hives   Wool Alcohol [Lanolin] Hives   Empagliflozin Nausea And Vomiting   Statins Other (See Comments)    Myopathy    Penicillin G Rash    Level of Care/Admitting Diagnosis ED Disposition     ED Disposition  Admit   Condition  --   Comment  Hospital Area: [facility] [[id number]]  Level of Care: Telemetry Medical [104]  May place patient in observation at [facility] or [facility] if equivalent level of care is available:: Yes  Covid Evaluation: Confirmed COVID Negative  Diagnosis: Melena [[id number]]  Admitting Physician: Miguel Rota [[id number]]  Attending Physician: Miguel Rota [[id number]]          B Medical/Surgery  History Past Medical History:  Diagnosis Date   Anginal pain ([name]    Arthritis    "fingers" (03/09/2012)   Cold feet    "from my diabetes; they stay cold" (03/09/2012)   Coronary artery disease    Coronary artery spasm [facility])    since age 54 Dr Aleen Campi   COVID 07/06/2019   hospitalizied   Dyslipidemia    Femoral DVT (deep venous thrombosis) ([name]    left   Headache(784.0)    Hx of gallstones    Dr Derrell Lolling lap cholecystectomy   Hx of plastic surgery    plastic surgery following a fall off a loading dock ,lost  all his upper teeth    Hypertension    MI, old    at age 51   Migraines    Pneumonia ~ 1962; 1970's   "double"; "regular" (03/09/2012)   Rheumatoid arthritis ([name]    Dr Dierdre Forth    Slipped cervical disc 06/10/2006   Dr Patsi Sears in the past/ Dr Larita Fife , chiropractor in 2008   Thyroid disease    Dr Lucianne Muss   Type II diabetes mellitus ([name]    Past Surgical History:  Procedure Laterality Date   CARDIAC CATHETERIZATION     x 3 with NO stents    CHOLECYSTECTOMY  03/11/2012   Procedure: LAPAROSCOPIC CHOLECYSTECTOMY WITH INTRAOPERATIVE CHOLANGIOGRAM;  Surgeon: Ernestene Mention, MD;  Location: [facility] OR;  Service: General;  Laterality: N/A;   CORONARY STENT INTERVENTION N/A 10/02/2022   Procedure: CORONARY STENT INTERVENTION;  Surgeon: Corky Crafts, MD;  Location: [facility] INVASIVE CV LAB;  Service: Cardiovascular;  Laterality: N/A;   CORONARY ULTRASOUND/IVUS N/A 10/02/2022   Procedure: Coronary Ultrasound/IVUS;  Surgeon: Corky Crafts, MD;  Location: [facility] INVASIVE CV LAB;  Service: Cardiovascular;  Laterality: N/A;   KYPHOPLASTY N/A 05/30/2021   Procedure: THORACIC FIVE KYPHOPLASTY;  Surgeon: Coletta Memos, MD;  Location: [facility] OR;  Service: Neurosurgery;  Laterality: N/A;  THORACIC FIVE KYPHOPLASTY   LEFT HEART CATH AND CORONARY ANGIOGRAPHY N/A 09/30/2022   Procedure: LEFT HEART CATH AND CORONARY ANGIOGRAPHY;  Surgeon: Corky Crafts, MD;  Location: [facility] INVASIVE CV LAB;   Service: Cardiovascular;  Laterality: N/A;   PENILE PROSTHESIS IMPLANT  1983     A IV Location/Drains/Wounds Patient Lines/Drains/Airways Status     Active Line/Drains/Airways     Name Placement date Placement time Site Days   Peripheral IV 03/16/23 20 G Anterior;Distal;Right;Upper Wrist 03/16/23  0040  Wrist  1   Peripheral IV 03/16/23 03/16/23  1900  --  1            Intake/Output Last 24 hours  Intake/Output Summary (Last 24 hours) at 03/17/2023 1743 Last data filed at 03/17/2023 2536 Gross per 24 hour  Intake 411.67 ml  Output --  Net 411.67 ml    Labs/Imaging Results for orders placed or performed during the hospital encounter of 03/16/23 (from the past 48 hour(s))  Brain natriuretic peptide     Status: None   Collection Time: 03/16/23  6:34 PM  Result Value Ref Range   B Natriuretic Peptide 46.3 0.0 - 100.0 pg/mL    Comment: Performed at [facility] Lab, [address], [place], [place] [address]  ABO/Rh     Status: None   Collection Time: 03/16/23  6:34 PM  Result Value Ref Range   ABO/RH(D)      A POS Performed at [facility], [address], [place], [place] [address]   CBC with Differential     Status: Abnormal   Collection Time: 03/16/23  6:56 PM  Result Value Ref Range   WBC 7.0 4.0 - 10.5 K/uL   RBC 2.71 (L) 4.22 - 5.81 MIL/uL   Hemoglobin 8.2 (L) 13.0 - 17.0 g/dL   HCT 95.6 (L) 38.7 - 56.4 %   MCV 92.3 80.0 - 100.0 fL   MCH 30.3 26.0 - 34.0 pg   MCHC 32.8 30.0 - 36.0 g/dL   RDW 33.2 95.1 - 88.4 %   Platelets 241 150 - 400 K/uL   nRBC 0.0 0.0 - 0.2 %   Neutrophils Relative % 67 %   Neutro Abs 4.7 1.7 - 7.7 K/uL   Lymphocytes Relative 22 %   Lymphs Abs 1.5 0.7 - 4.0 K/uL   Monocytes Relative 7 %   Monocytes Absolute 0.5 0.1 - 1.0 K/uL   Eosinophils Relative 3 %   Eosinophils Absolute 0.2 0.0 - 0.5 K/uL   Basophils Relative 1 %   Basophils Absolute 0.1 0.0 - 0.1 K/uL   Immature Granulocytes 0 %   Abs Immature Granulocytes 0.02  0.00 - 0.07 K/uL    Comment: Performed at [facility] Lab, [address], [place], [place] [address]  Comprehensive metabolic panel     Status: Abnormal   Collection Time: 03/16/23  6:56 PM  Result Value Ref Range   Sodium 137 135 - 145 mmol/L   Potassium 3.6 3.5 - 5.1 mmol/L   Chloride 102 98 - 111 mmol/L  CO2 22 22 - 32 mmol/L   Glucose, Bld 129 (H) 70 - 99 mg/dL    Comment: Glucose reference range applies only to samples taken after fasting for at least 8 hours.   BUN 15 8 - 23 mg/dL   Creatinine, Ser 4.01 0.61 - 1.24 mg/dL   Calcium 8.4 (L) 8.9 - 10.3 mg/dL   Total Protein 6.5 6.5 - 8.1 g/dL   Albumin 2.7 (L) 3.5 - 5.0 g/dL   AST 19 15 - 41 U/L   ALT 5 0 - 44 U/L   Alkaline Phosphatase 104 38 - 126 U/L   Total Bilirubin 0.2 (L) 0.3 - 1.2 mg/dL   GFR, Estimated >02 >72 mL/min    Comment: (NOTE) Calculated using the CKD-EPI Creatinine Equation (2021)    Anion gap 13 5 - 15    Comment: Performed at [facility] Lab, [address], [place], [place] [address]  Troponin I (High Sensitivity)     Status: None   Collection Time: 03/16/23  6:56 PM  Result Value Ref Range   Troponin I (High Sensitivity) 5 <18 ng/L    Comment: (NOTE) Elevated high sensitivity troponin I (hsTnI) values and significant  changes across serial measurements may suggest ACS but many other  chronic and acute conditions are known to elevate hsTnI results.  Refer to the "Links" section for chest pain algorithms and additional  guidance. Performed at [facility], [address], [place], [place] [address]   Troponin I (High Sensitivity)     Status: None   Collection Time: 03/16/23  8:51 PM  Result Value Ref Range   Troponin I (High Sensitivity) 5 <18 ng/L    Comment: (NOTE) Elevated high sensitivity troponin I (hsTnI) values and significant  changes across serial measurements may suggest ACS but many other  chronic and acute conditions are known to elevate hsTnI results.  Refer to the  "Links" section for chest pain algorithms and additional  guidance. Performed at [facility] Lab, [address] [place], [place] [address]   POC occult blood, ED     Status: Abnormal   Collection Time: 03/16/23  9:25 PM  Result Value Ref Range   Fecal Occult Bld POSITIVE (A) NEGATIVE  Reticulocytes     Status: Abnormal   Collection Time: 03/16/23 11:25 PM  Result Value Ref Range   Retic Ct Pct 1.1 0.4 - 3.1 %   RBC. 2.49 (L) 4.22 - 5.81 MIL/uL   Retic Count, Absolute 27.4 19.0 - 186.0 K/uL   Immature Retic Fract 3.6 2.3 - 15.9 %    Comment: Performed at [facility] Lab, [address] [place], [place] [address]  Hemoglobin and hematocrit, blood     Status: Abnormal   Collection Time: 03/16/23 11:25 PM  Result Value Ref Range   Hemoglobin 7.6 (L) 13.0 - 17.0 g/dL   HCT 64.3 (L) 32.9 - 51.8 %    Comment: Performed at [facility] Lab, [address], [place], [place] [address]  Prepare RBC (crossmatch)     Status: None   Collection Time: 03/16/23 11:55 PM  Result Value Ref Range   Order Confirmation      ORDER PROCESSED BY BLOOD BANK Performed at [facility] Lab, [address] [place], [place] [address]   Protime-INR     Status: Abnormal   Collection Time: 03/17/23 12:12 AM  Result Value Ref Range   Prothrombin Time 17.9 (H) 11.4 - 15.2 seconds  INR 1.5 (H) 0.8 - 1.2    Comment: (NOTE) INR goal varies based on device and disease states. Performed at [facility] Lab, [address], [place], [place] [address]   Type and screen [facility]     Status: None (Preliminary result)   Collection Time: 03/17/23 12:12 AM  Result Value Ref Range   ABO/RH(D) A POS    Antibody Screen NEG    Sample Expiration      03/20/2023,2359 Performed at [facility], [address] [place], [place] [address]    Unit Number [id number]    Blood Component Type RED CELLS,LR    Unit division 00    Status of Unit ALLOCATED    Transfusion Status OK TO  TRANSFUSE    Crossmatch Result Compatible   CBG monitoring, ED     Status: Abnormal   Collection Time: 03/17/23 12:32 AM  Result Value Ref Range   Glucose-Capillary 184 (H) 70 - 99 mg/dL    Comment: Glucose reference range applies only to samples taken after fasting for at least 8 hours.  Basic metabolic panel     Status: Abnormal   Collection Time: 03/17/23  2:53 AM  Result Value Ref Range   Sodium 136 135 - 145 mmol/L   Potassium 3.2 (L) 3.5 - 5.1 mmol/L   Chloride 100 98 - 111 mmol/L   CO2 23 22 - 32 mmol/L   Glucose, Bld 121 (H) 70 - 99 mg/dL    Comment: Glucose reference range applies only to samples taken after fasting for at least 8 hours.   BUN 13 8 - 23 mg/dL   Creatinine, Ser 4.13 0.61 - 1.24 mg/dL   Calcium 8.5 (L) 8.9 - 10.3 mg/dL   GFR, Estimated >24 >40 mL/min    Comment: (NOTE) Calculated using the CKD-EPI Creatinine Equation (2021)    Anion gap 13 5 - 15    Comment: Performed at [facility] Lab, [address] [place], [place] [address]  Magnesium     Status: None   Collection Time: 03/17/23  2:53 AM  Result Value Ref Range   Magnesium 1.7 1.7 - 2.4 mg/dL    Comment: Performed at [facility], [address], [place], [place] [address]  Phosphorus     Status: None   Collection Time: 03/17/23  2:53 AM  Result Value Ref Range   Phosphorus 3.0 2.5 - 4.6 mg/dL    Comment: Performed at [facility] Lab, [address], [place], [place] [address]  Iron and TIBC     Status: Abnormal   Collection Time: 03/17/23  2:53 AM  Result Value Ref Range   Iron 42 (L) 45 - 182 ug/dL   TIBC 425 956 - 387 ug/dL   Saturation Ratios 15 (L) 17.9 - 39.5 %   UIBC 235 ug/dL    Comment: Performed at [facility] Lab, [address], [place], [place] [address]  Ferritin     Status: None   Collection Time: 03/17/23  2:53 AM  Result Value Ref Range   Ferritin 106 24 - 336 ng/mL    Comment: Performed at [facility], [address] [place], [place] [address]  CBC      Status: Abnormal   Collection Time: 03/17/23  3:42 AM  Result Value Ref Range   WBC 8.9 4.0 - 10.5 K/uL   RBC 3.54 (L) 4.22 - 5.81 MIL/uL   Hemoglobin 10.7 (L)  13.0 - 17.0 g/dL   HCT 08.6 (L) 57.8 - 46.9 %   MCV 90.4 80.0 - 100.0 fL   MCH 30.2 26.0 - 34.0 pg   MCHC 33.4 30.0 - 36.0 g/dL   RDW 62.9 52.8 - 41.3 %   Platelets 263 150 - 400 K/uL   nRBC 0.0 0.0 - 0.2 %    Comment: Performed at [facility] Lab, [address], [place], [place] [address]  CBG monitoring, ED     Status: Abnormal   Collection Time: 03/17/23  3:57 AM  Result Value Ref Range   Glucose-Capillary 136 (H) 70 - 99 mg/dL    Comment: Glucose reference range applies only to samples taken after fasting for at least 8 hours.  CBC     Status: Abnormal   Collection Time: 03/17/23  5:46 AM  Result Value Ref Range   WBC 8.3 4.0 - 10.5 K/uL   RBC 3.41 (L) 4.22 - 5.81 MIL/uL   Hemoglobin 10.1 (L) 13.0 - 17.0 g/dL   HCT 02.7 (L) 25.3 - 66.4 %   MCV 90.9 80.0 - 100.0 fL   MCH 29.6 26.0 - 34.0 pg   MCHC 32.6 30.0 - 36.0 g/dL   RDW 40.3 47.4 - 25.9 %   Platelets 256 150 - 400 K/uL   nRBC 0.0 0.0 - 0.2 %    Comment: Performed at [facility] Lab, [address], [place], [place] [address]  CBG monitoring, ED     Status: Abnormal   Collection Time: 03/17/23  8:02 AM  Result Value Ref Range   Glucose-Capillary 102 (H) 70 - 99 mg/dL    Comment: Glucose reference range applies only to samples taken after fasting for at least 8 hours.  CBG monitoring, ED     Status: None   Collection Time: 03/17/23 11:09 AM  Result Value Ref Range   Glucose-Capillary 97 70 - 99 mg/dL    Comment: Glucose reference range applies only to samples taken after fasting for at least 8 hours.  CBG monitoring, ED     Status: Abnormal   Collection Time: 03/17/23  4:12 PM  Result Value Ref Range   Glucose-Capillary 137 (H) 70 - 99 mg/dL    Comment: Glucose reference range applies only to samples taken after fasting for at least 8 hours.  CBG  monitoring, ED     Status: Abnormal   Collection Time: 03/17/23  4:50 PM  Result Value Ref Range   Glucose-Capillary 116 (H) 70 - 99 mg/dL    Comment: Glucose reference range applies only to samples taken after fasting for at least 8 hours.   VAS [place] LOWER EXTREMITY VENOUS (DVT)  Result Date: 03/17/2023  Lower Venous DVT Study Patient Name:  VERIL HARKCOM  Date of Exam:   03/17/2023 Medical Rec #: [id number]      Accession #:    [id number] Date of Birth: 1945-12-09      Patient Gender: M Patient Age:   35 years Exam Location:  [facility] Procedure:      VAS [place] LOWER EXTREMITY VENOUS (DVT) Referring Phys: Enid Derry HALL --------------------------------------------------------------------------------  Indications: Chest pain, systemic anemia.  Risk Factors: PE 11/26/21 DVT 11/27/21. Anticoagulation: Eliquis. Limitations: Body habitus Comparison Study: Prior bilateral LEV done 11/27/21 indicating acute DVT in the                   left SFJ, common femoral, femoral, popliteal, and peroneal  veins Performing Technologist: Sherren Kerns RVS  Examination Guidelines: A complete evaluation includes B-mode imaging, spectral Doppler, color Doppler, and power Doppler as needed of all accessible portions of each vessel. Bilateral testing is considered an integral part of a complete examination. Limited examinations for reoccurring indications may be performed as noted. The reflux portion of the exam is performed with the patient in reverse Trendelenburg.  +---------+---------------+---------+-----------+----------+---------------+ RIGHT    CompressibilityPhasicitySpontaneityPropertiesThrombus Aging  +---------+---------------+---------+-----------+----------+---------------+ CFV      Full           Yes      Yes                                  +---------+---------------+---------+-----------+----------+---------------+ SFJ      Full                                                          +---------+---------------+---------+-----------+----------+---------------+ FV Prox  Full                                                         +---------+---------------+---------+-----------+----------+---------------+ FV Mid   Full                                                         +---------+---------------+---------+-----------+----------+---------------+ FV DistalFull                                                         +---------+---------------+---------+-----------+----------+---------------+ PFV      Full                                                         +---------+---------------+---------+-----------+----------+---------------+ POP      Full           Yes      Yes                                  +---------+---------------+---------+-----------+----------+---------------+ PTV                                                   patent by color +---------+---------------+---------+-----------+----------+---------------+ PERO  patent by color +---------+---------------+---------+-----------+----------+---------------+   +---------+---------------+---------+-----------+----------+-------------------+ LEFT     CompressibilityPhasicitySpontaneityPropertiesThrombus Aging      +---------+---------------+---------+-----------+----------+-------------------+ CFV      Full           Yes      Yes                                      +---------+---------------+---------+-----------+----------+-------------------+ SFJ      Full                                                             +---------+---------------+---------+-----------+----------+-------------------+ FV Prox  Full                                                             +---------+---------------+---------+-----------+----------+-------------------+ FV Mid   Full                                                              +---------+---------------+---------+-----------+----------+-------------------+ FV Distal               Yes      Yes                  patent by color and                                                       Doppler             +---------+---------------+---------+-----------+----------+-------------------+ PFV      Full                                                             +---------+---------------+---------+-----------+----------+-------------------+ POP      Full           Yes      Yes                                      +---------+---------------+---------+-----------+----------+-------------------+ PTV      Full                                                             +---------+---------------+---------+-----------+----------+-------------------+ PERO     Full                                                             +---------+---------------+---------+-----------+----------+-------------------+  Summary: BILATERAL: - No evidence of deep vein thrombosis seen in the lower extremities, bilaterally. -No evidence of popliteal cyst, bilaterally.  LEFT: - Findings suggest resolution of previously noted thrombus.  *See table(s) above for measurements and observations. Electronically signed by Gerarda Fraction on 03/17/2023 at 4:49:31 PM.    Final    DG Chest Portable 1 View  Result Date: 03/16/2023 CLINICAL DATA:  Chest pain EXAM: PORTABLE CHEST 1 VIEW COMPARISON:  03/02/2023 FINDINGS: Stable cardiomediastinal silhouette. Aortic atherosclerotic calcification. Low lung volumes accentuate pulmonary vascularity. Chronic bronchitic changes. No focal consolidation, pleural effusion, or pneumothorax. No displaced rib fractures. Vertebroplasty of a midthoracic vertebral body. IMPRESSION: No active disease. Electronically Signed   By: Minerva Fester M.D.   On: 03/16/2023 20:21    Pending Labs Unresulted Labs (From admission, onward)      Start     Ordered   03/18/23 0500  Basic metabolic panel  Tomorrow morning,   R        03/17/23 0735   03/18/23 0500  CBC  Daily,   R      03/17/23 0735   03/18/23 0500  Magnesium  Daily,   R      03/17/23 0735   03/18/23 0500  Phosphorus  Tomorrow morning,   R        03/17/23 0735            Vitals/Pain Today's Vitals   03/17/23 1500 03/17/23 1630 03/17/23 1647 03/17/23 1700  BP:  120/69  107/60  Pulse:  72  65  Resp:  15  14  Temp: 97.9 F (36.6 C)     TempSrc:      SpO2:  100%  100%  Weight:      Height:      PainSc:   5      Isolation Precautions No active isolations  Medications Medications  acetaminophen (TYLENOL) tablet 650 mg (has no administration in time range)  prochlorperazine (COMPAZINE) injection 5 mg (5 mg Intravenous Given 03/17/23 0805)  melatonin tablet 5 mg (has no administration in time range)  0.9 %  sodium chloride infusion (Manually program via Guardrails IV Fluids) (0 mLs Intravenous Hold 03/17/23 0420)  pantoprazole (PROTONIX) injection 40 mg (40 mg Intravenous Given 03/17/23 0937)  insulin aspart (novoLOG) injection 0-9 Units ( Subcutaneous Not Given 03/17/23 1652)  0.9 %  sodium chloride infusion ( Intravenous New Bag/Given 03/17/23 1644)  ipratropium-albuterol (DUONEB) 0.5-2.5 (3) MG/3ML nebulizer solution 3 mL (has no administration in time range)  ondansetron (ZOFRAN) injection 4 mg (has no administration in time range)  hydrALAZINE (APRESOLINE) injection 10 mg (has no administration in time range)  guaiFENesin (ROBITUSSIN) 100 MG/5ML liquid 5 mL (has no administration in time range)  senna-docusate (Senokot-S) tablet 1 tablet (has no administration in time range)  traZODone (DESYREL) tablet 50 mg (has no administration in time range)  metoprolol tartrate (LOPRESSOR) injection 5 mg (has no administration in time range)  polyethylene glycol-electrolytes (NuLYTELY) solution 4,000 mL (has no administration in time range)  magnesium oxide  (MAG-OX) tablet 800 mg (800 mg Oral Given 03/17/23 0804)  potassium chloride SA (KLOR-CON M) CR tablet 40 mEq (40 mEq Oral Given 03/17/23 0804)    Mobility non-ambulatory     Focused Assessments Cardiac Assessment Handoff:  Cardiac Rhythm: Sinus bradycardia Lab Results  Component Value Date   CKTOTAL 20 (L) 11/25/2021   CKMB 1.8 08/07/2007   TROPONINI <0.03 05/02/2016   Lab Results  Component Value Date  DDIMER 0.97 (H) 09/29/2022   Does the Patient currently have chest pain? No    R Recommendations: See Admitting Provider Note  Report given to:   Additional Notes:

## 2023-03-17 NOTE — ED Notes (Signed)
Pt has been cleaned up.

## 2023-03-17 NOTE — Care Management CC44 (Signed)
Condition Code 44 Documentation Completed  Patient Details  Name: William Hunter MRN: [id number] Date of Birth: 1945-08-14   Condition Code 44 given:  Yes Patient signature on Condition Code 44 notice:  Yes Documentation of 2 MD's agreement:  Yes Code 44 added to claim:  Yes    Oletta Cohn, RN 03/17/2023, 3:25 PM

## 2023-03-17 NOTE — Consult Note (Signed)
[facility] Consult  Referring Provider: Dr.Amin/[facility] hospitalist Primary Care Physician:  Irena Reichmann, DO Primary Gastroenterologist: Last saw Dr. Levora Angel in 2018  Reason for Consultation: Anemia  HPI: William Hunter is a 77 y.o. male presented to the ER with substernal chest pain. Patient states he was lying when he developed severe chest pain, nonradiating, not associated with nausea, vomiting or abdominal pain. Pain has resolved at this point. When he came to the ER his hemoglobin was 8.2, subsequent hemoglobin 7.6, repeat blood work showed hemoglobin 10.7 and 10.1.  Patient states he is at a nursing facility for the last 4 months. He does not notice the color of his stool but states he is not aware of black stools or blood in stools. He is on Plavix. Denies use of aspirin, NSAIDs. Denies acid reflux, heartburn, difficulty swallowing or pain on swallowing.  He reports 70 pounds unintentional weight loss in the last 6 months. He denies change in appetite.  Previous GI workup: EGD, 2018, hematemesis, diarrhea, nausea, vomiting: Small hiatal hernia, biopsies negative for Barrett's, H. pylori, celiac.  No prior documented colonoscopy although patient states he has had a colonoscopy many years ago.   Past Medical History:  Diagnosis Date   Anginal pain ([name]    Arthritis    "fingers" (03/09/2012)   Cold feet    "from my diabetes; they stay cold" (03/09/2012)   Coronary artery disease    Coronary artery spasm [facility])    since age 14 Dr Aleen Campi   COVID 07/06/2019   hospitalizied   Dyslipidemia    Femoral DVT (deep venous thrombosis) ([name]    left   Headache(784.0)    Hx of gallstones    Dr Derrell Lolling lap cholecystectomy   Hx of plastic surgery    plastic surgery following a fall off a loading dock ,lost  all his upper teeth    Hypertension    MI, old    at age 83   Migraines    Pneumonia ~ 1962; 1970's   "double"; "regular" (03/09/2012)   Rheumatoid  arthritis ([name]    Dr Dierdre Forth    Slipped cervical disc 06/10/2006   Dr Patsi Sears in the past/ Dr Larita Fife , chiropractor in 2008   Thyroid disease    Dr Lucianne Muss   Type II diabetes mellitus [facility])     Past Surgical History:  Procedure Laterality Date   CARDIAC CATHETERIZATION     x 3 with NO stents    CHOLECYSTECTOMY  03/11/2012   Procedure: LAPAROSCOPIC CHOLECYSTECTOMY WITH INTRAOPERATIVE CHOLANGIOGRAM;  Surgeon: Ernestene Mention, MD;  Location: [facility] OR;  Service: General;  Laterality: N/A;   CORONARY STENT INTERVENTION N/A 10/02/2022   Procedure: CORONARY STENT INTERVENTION;  Surgeon: Corky Crafts, MD;  Location: [facility] INVASIVE CV LAB;  Service: Cardiovascular;  Laterality: N/A;   CORONARY ULTRASOUND/IVUS N/A 10/02/2022   Procedure: Coronary Ultrasound/IVUS;  Surgeon: Corky Crafts, MD;  Location: [facility] INVAS[facility] CV LAB;  Service: Cardiovascular;  Laterality: N/A;   KYPHOPLASTY N/A 05/30/2021   Procedure: THORACIC FIVE KYPHOPLASTY;  Surgeon: Coletta Memos, MD;  Location: [facility] OR;  Service: Neurosurgery;  Laterality: N/A;  THORACIC FIVE KYPHOPLASTY   LEFT HEART CATH AND CORONARY ANGIOGRAPHY N/A 09/30/2022   Procedure: LEFT HEART CATH AND CORONARY ANGIOGRAPHY;  Surgeon: Corky Crafts, MD;  Location: [facility] INVASIVE CV LAB;  Service: Cardiovascular;  Laterality: N/A;   PENILE PROSTHESIS IMPLANT  1983    Prior to Admission medications   Medication Sig Start Date End  Date Taking? Authorizing Provider  amLODipine (NORVASC) 5 MG tablet Take 1 tablet (5 mg total) by mouth daily. 10/04/22  Yes Lanae Boast, MD  apixaban (ELIQUIS) 5 MG TABS tablet Take 1 tablet (5 mg total) by mouth 2 (two) times daily. 10/03/22  Yes Kc, Dayna Barker, MD  carbidopa-levodopa (SINEMET IR) 25-100 MG tablet Take 1 tablet by mouth 3 (three) times daily. 04/10/22  Yes Levert Feinstein, MD  clopidogrel (PLAVIX) 75 MG tablet Take 1 tablet (75 mg total) by mouth daily with breakfast. 10/04/22  Yes Kc, Dayna Barker, MD  diltiazem (TIADYLT ER)  300 MG 24 hr capsule Take 1 capsule (300 mg total) by mouth daily. Pt needs to keep upcoming appt for further refills. 12/13/22  Yes Jake Bathe, MD  famotidine (PEPCID) 20 MG tablet Take 1 tablet (20 mg total) by mouth daily. 11/13/22  Yes Elayne Snare K, DO  folic acid (FOLVITE) 1 MG tablet Take 1 tablet (1 mg total) by mouth daily. 09/26/22  Yes Rice, Jamesetta Orleans, MD  GLUCAGON, RDNA, IJ Inject 1 mg as directed once as needed (hypoglycemia).   Yes [provider]  hydroxychloroquine (PLAQUENIL) 200 MG tablet Take 1 tablet (200 mg total) by mouth daily. 09/26/22  Yes Rice, Jamesetta [place], MD  insulin aspart (NOVOLOG) 100 UNIT/ML injection 0-5 Units, Subcutaneous, Daily at bedtime: HS scale CBG < 70: implement hypoglycemia measures CBG 70 - 120: 0 units CBG 121 - 150: 0 units CBG 151 - 200: 0 units CBG 201 - 250: 2 units CBG 251 - 300: 3 units CBG 301 - 350: 4 units CBG 351 - 400: 5 units CBG > 400: call MD and obtain STAT lab verification Patient taking differently: Inject 0-5 Units into the skin in the morning, at noon, in the evening, and at bedtime. Sliding Scale: 0-150 = 0 units 151-200 = 1 units 201-250 = 2 units 251-300 = 3 units 301-350 = 4 units 351-400 = 5 units 401-999 = 5 units, CALL MD. 11/29/21  Yes Ghimire, Werner Lean, MD  insulin aspart (NOVOLOG) 100 UNIT/ML injection 0-15 Units, Subcutaneous, 3 times daily with meals CBG < 70: Implement Hypoglycemia Standing Orders and refer to Hypoglycemia Standing Orders sidebar report CBG 70 - 120: 0 units CBG 121 - 150: 2 units CBG 151 - 200: 3 units CBG 201 - 250: 5 units CBG 251 - 300: 8 units CBG 301 - 350: 11 units CBG 351 - 400: 15 units CBG > 400: call MD and obtain STAT lab verification Patient taking differently: Inject 0-15 Units into the skin in the morning, at noon, in the evening, and at bedtime. Sliding scale: 0-120 = 0 units 121-150 = 2 units 151-200 = 3 units 201-250 = 5 units 251-300 = 8 units 301-351 = 11  units 351-400 = 15 units 401-999 = 15 units, CALL MD. 11/29/21  Yes Ghimire, Werner Lean, MD  isosorbide mononitrate (IMDUR) 30 MG 24 hr tablet Take 0.5 tablets (15 mg total) by mouth daily. 10/04/22  Yes Lanae Boast, MD  meclizine (ANTIVERT) 25 MG tablet Take 25 mg by mouth 3 (three) times daily as needed for dizziness.   Yes [provider]  melatonin 5 MG TABS Take 5 mg by mouth at bedtime.   Yes [provider]  methotrexate (RHEUMATREX) 2.5 MG tablet Take 6 tablets (15 mg total) by mouth once a week. Caution:Chemotherapy. Protect from light. Patient taking differently: Take 15 mg by mouth once a week. Mondays. 09/26/22  Yes Rice, Jamesetta [place],  MD  metoprolol succinate (TOPROL-XL) 25 MG 24 hr tablet Take 1 tablet (25 mg total) by mouth daily. 10/03/22  Yes Kc, Dayna Barker, MD  MYRBETRIQ 25 MG TB24 tablet Take 25 mg by mouth daily. 09/12/22  Yes [provider]  nitroGLYCERIN (NITROSTAT) 0.4 MG SL tablet Place 1 tablet (0.4 mg total) under the tongue every 5 (five) minutes x 3 doses as needed for chest pain. 10/03/22  Yes Kc, Dayna Barker, MD  ondansetron (ZOFRAN) 4 MG tablet Take 1 tablet (4 mg total) by mouth every 6 (six) hours. 11/13/22  Yes Theresia Lo, Victoria K, DO  pantoprazole (PROTONIX) 40 MG tablet Take 1 tablet (40 mg total) by mouth daily at 6 (six) AM. 10/03/22  Yes Kc, Ramesh, MD  sertraline (ZOLOFT) 50 MG tablet Take 50 mg by mouth daily.   Yes [provider]  sucralfate (CARAFATE) 1 g tablet Take 1 tablet (1 g total) by mouth 4 (four) times daily -  with meals and at bedtime. 11/08/22  Yes Fayrene Helper, PA-C  TRESIBA FLEXTOUCH 200 UNIT/ML FlexTouch Pen Inject 23 Units into the skin at bedtime. 11/08/22  Yes [provider]  BD PEN NEEDLE MICRO U/F 32G X 6 MM MISC Inject into the skin 3 (three) times daily. 09/16/22   [provider]  BD PEN NEEDLE NANO 2ND GEN 32G X 4 MM MISC 1 each by Other route as directed. 06/07/22   [provider]   Continuous Glucose Sensor (FREESTYLE LIBRE 2 SENSOR) MISC Place onto the skin. 09/18/22   [provider]    Current Facility-Administered Medications  Medication Dose Route Frequency Provider Last Rate Last Admin   0.9 %  sodium chloride infusion (Manually program via Guardrails IV Fluids)   Intravenous Once Darlin Drop, DO   Held at 03/17/23 0420   0.9 %  sodium chloride infusion   Intravenous Continuous Darlin Drop, DO 50 mL/hr at 03/17/23 1610 Infusion Verify at 03/17/23 9604   acetaminophen (TYLENOL) tablet 650 mg  650 mg Oral Q6H PRN Dow Adolph N, DO       guaiFENesin (ROBITUSSIN) 100 MG/5ML liquid 5 mL  5 mL Oral Q4H PRN Amin, Ankit C, MD       hydrALAZINE (APRESOLINE) injection 10 mg  10 mg Intravenous Q4H PRN Amin, Ankit C, MD       insulin aspart (novoLOG) injection 0-9 Units  0-9 Units Subcutaneous Q4H Dow Adolph N, DO   1 Units at 03/17/23 0412   ipratropium-albuterol (DUONEB) 0.5-2.5 (3) MG/3ML nebulizer solution 3 mL  3 mL Nebulization Q4H PRN Amin, Ankit C, MD       melatonin tablet 5 mg  5 mg Oral QHS PRN Dow Adolph N, DO       metoprolol tartrate (LOPRESSOR) injection 5 mg  5 mg Intravenous Q4H PRN Amin, Ankit C, MD       ondansetron (ZOFRAN) injection 4 mg  4 mg Intravenous Q6H PRN Amin, Ankit C, MD       pantoprazole (PROTONIX) injection 40 mg  40 mg Intravenous BID Dow Adolph N, DO   40 mg at 03/17/23 5409   prochlorperazine (COMPAZINE) injection 5 mg  5 mg Intravenous Q6H PRN Dow Adolph N, DO   5 mg at 03/17/23 0805   senna-docusate (Senokot-S) tablet 1 tablet  1 tablet Oral QHS PRN Amin, Ankit C, MD       Study - ORION 4 - inclisiran 300 mg/1.33mL or placebo SQ injection (PI-Stuckey)  300 mg  Subcutaneous Q6 months Herby Abraham, MD       traZODone (DESYREL) tablet 50 mg  50 mg Oral QHS PRN Miguel Rota, MD       Current Outpatient Medications  Medication Sig Dispense Refill   amLODipine (NORVASC) 5 MG tablet Take 1 tablet (5 mg total) by  mouth daily.     apixaban (ELIQUIS) 5 MG TABS tablet Take 1 tablet (5 mg total) by mouth 2 (two) times daily. 60 tablet    carbidopa-levodopa (SINEMET IR) 25-100 MG tablet Take 1 tablet by mouth 3 (three) times daily. 90 tablet 6   clopidogrel (PLAVIX) 75 MG tablet Take 1 tablet (75 mg total) by mouth daily with breakfast.     diltiazem (TIADYLT ER) 300 MG 24 hr capsule Take 1 capsule (300 mg total) by mouth daily. Pt needs to keep upcoming appt for further refills. 90 capsule 1   famotidine (PEPCID) 20 MG tablet Take 1 tablet (20 mg total) by mouth daily. 30 tablet 0   folic acid (FOLVITE) 1 MG tablet Take 1 tablet (1 mg total) by mouth daily. 90 tablet 3   GLUCAGON, RDNA, IJ Inject 1 mg as directed once as needed (hypoglycemia).     hydroxychloroquine (PLAQUENIL) 200 MG tablet Take 1 tablet (200 mg total) by mouth daily. 90 tablet 0   insulin aspart (NOVOLOG) 100 UNIT/ML injection 0-5 Units, Subcutaneous, Daily at bedtime: HS scale CBG < 70: implement hypoglycemia measures CBG 70 - 120: 0 units CBG 121 - 150: 0 units CBG 151 - 200: 0 units CBG 201 - 250: 2 units CBG 251 - 300: 3 units CBG 301 - 350: 4 units CBG 351 - 400: 5 units CBG > 400: call MD and obtain STAT lab verification (Patient taking differently: Inject 0-5 Units into the skin in the morning, at noon, in the evening, and at bedtime. Sliding Scale: 0-150 = 0 units 151-200 = 1 units 201-250 = 2 units 251-300 = 3 units 301-350 = 4 units 351-400 = 5 units 401-999 = 5 units, CALL MD.) 10 mL 11   insulin aspart (NOVOLOG) 100 UNIT/ML injection 0-15 Units, Subcutaneous, 3 times daily with meals CBG < 70: Implement Hypoglycemia Standing Orders and refer to Hypoglycemia Standing Orders sidebar report CBG 70 - 120: 0 units CBG 121 - 150: 2 units CBG 151 - 200: 3 units CBG 201 - 250: 5 units CBG 251 - 300: 8 units CBG 301 - 350: 11 units CBG 351 - 400: 15 units CBG > 400: call MD and obtain STAT lab verification (Patient taking differently:  Inject 0-15 Units into the skin in the morning, at noon, in the evening, and at bedtime. Sliding scale: 0-120 = 0 units 121-150 = 2 units 151-200 = 3 units 201-250 = 5 units 251-300 = 8 units 301-351 = 11 units 351-400 = 15 units 401-999 = 15 units, CALL MD.) 10 mL 11   isosorbide mononitrate (IMDUR) 30 MG 24 hr tablet Take 0.5 tablets (15 mg total) by mouth daily.     meclizine (ANTIVERT) 25 MG tablet Take 25 mg by mouth 3 (three) times daily as needed for dizziness.     melatonin 5 MG TABS Take 5 mg by mouth at bedtime.     methotrexate (RHEUMATREX) 2.5 MG tablet Take 6 tablets (15 mg total) by mouth once a week. Caution:Chemotherapy. Protect from light. (Patient taking differently: Take 15 mg by mouth once a week. Mondays.) 72 tablet 0   metoprolol succinate (  TOPROL-XL) 25 MG 24 hr tablet Take 1 tablet (25 mg total) by mouth daily.     MYRBETRIQ 25 MG TB24 tablet Take 25 mg by mouth daily.     nitroGLYCERIN (NITROSTAT) 0.4 MG SL tablet Place 1 tablet (0.4 mg total) under the tongue every 5 (five) minutes x 3 doses as needed for chest pain.  12   ondansetron (ZOFRAN) 4 MG tablet Take 1 tablet (4 mg total) by mouth every 6 (six) hours. 12 tablet 0   pantoprazole (PROTONIX) 40 MG tablet Take 1 tablet (40 mg total) by mouth daily at 6 (six) AM.     sertraline (ZOLOFT) 50 MG tablet Take 50 mg by mouth daily.     sucralfate (CARAFATE) 1 g tablet Take 1 tablet (1 g total) by mouth 4 (four) times daily -  with meals and at bedtime. 30 tablet 0   TRESIBA FLEXTOUCH 200 UNIT/ML FlexTouch Pen Inject 23 Units into the skin at bedtime.     BD PEN NEEDLE MICRO U/F 32G X 6 MM MISC Inject into the skin 3 (three) times daily.     BD PEN NEEDLE NANO 2ND GEN 32G X 4 MM MISC 1 each by Other route as directed.     Continuous Glucose Sensor (FREESTYLE LIBRE 2 SENSOR) MISC Place onto the skin.      Allergies as of 03/16/2023 - Review Complete 03/16/2023  Allergen Reaction Noted   Codeine Swelling and Rash  03/09/2012   Penicillins Hives and Swelling 03/09/2012   Prednisone Hives 05/02/2016   Wool alcohol [lanolin] Hives 03/09/2012   Empagliflozin Nausea And Vomiting 02/10/2020   Statins Other (See Comments) 11/26/2021   Penicillin g Rash 11/26/2021    Family History  Problem Relation Age of Onset   Heart failure Mother    Cancer - Other Mother        breast   Heart disease Father    Cancer - Other Sister        tongue cancer   Heart attack Sister     Social History   Socioeconomic History   Marital status: Married    Spouse name: Talbert Forest   Number of children: 0   Years of education: 14   Highest education level: Not on file  Occupational History    Comment: [employer]  Tobacco Use   Smoking status: Never    Passive exposure: Never   Smokeless tobacco: Never  Vaping Use   Vaping status: Never Used  Substance and Sexual Activity   Alcohol use: No   Drug use: No   Sexual activity: Yes  Other Topics Concern   Not on file  Social History Narrative   Lives with wife   Caffeine- Diet Coke 5 daily   Social Determinants of Health   Financial Resource Strain: Not on file  Food Insecurity: No Food Insecurity (03/17/2023)   Hunger Vital Sign    Worried About Running Out of Food in the Last Year: Never true    Ran Out of Food in the Last Year: Never true  Transportation Needs: No Transportation Needs (03/17/2023)   PRAPARE - Administrator, Civil Service (Medical): No    Lack of Transportation (Non-Medical): No  Physical Activity: Not on file  Stress: Not on file  Social Connections: Not on file  Intimate Partner Violence: Not At Risk (03/17/2023)   Humiliation, Afraid, Rape, and Kick questionnaire    Fear of Current or Ex-Partner: No    Emotionally Abused:  No    Physically Abused: No    Sexually Abused: No    Review of Systems: As per HPI Physical Exam: Vital signs in last 24 hours: Temp:  [97.8 F (36.6 C)-98.6 F (37 C)] 97.8 F (36.6 C)  (10/07 1110) Pulse Rate:  [50-60] 59 (10/07 1300) Resp:  [11-23] 15 (10/07 1300) BP: (92-141)/(57-91) 131/63 (10/07 1300) SpO2:  [97 %-100 %] 99 % (10/07 1300) Weight:  [70.3 kg] 70.3 kg (10/06 1845) Last BM Date : 03/16/23  General:   Alert, thin,cooperative in NAD Head:  Normocephalic and atraumatic. Eyes:  Sclera clear, no icterus.   Mild pallor Ears:  Normal auditory acuity. Nose:  No deformity, discharge,  or lesions. Mouth:  No deformity or lesions.  Oropharynx pink & moist. Neck:  Supple; no masses or thyromegaly. Lungs:  Clear throughout to auscultation.   No wheezes, crackles, or rhonchi. No acute distress. Heart:  Regular rate and rhythm; no murmurs, clicks, rubs,  or gallops. Extremities:  Without clubbing or edema. Neurologic:  Alert and  oriented x4;  grossly normal neurologically. Skin:  Intact without significant lesions or rashes. Psych:  Alert and cooperative. Normal mood and affect. Abdomen:  Soft, nontender and nondistended. No masses, hepatosplenomegaly or hernias noted. Normal bowel sounds, without guarding, and without rebound.         Lab Results: Recent Labs    03/16/23 1856 03/16/23 2325 03/17/23 0342 03/17/23 0546  WBC 7.0  --  8.9 8.3  HGB 8.2* 7.6* 10.7* 10.1*  HCT 25.0* 22.9* 32.0* 31.0*  PLT 241  --  263 256   BMET Recent Labs    03/16/23 1856 03/17/23 0253  NA 137 136  K 3.6 3.2*  CL 102 100  CO2 22 23  GLUCOSE 129* 121*  BUN 15 13  CREATININE 0.79 0.66  CALCIUM 8.4* 8.5*   LFT Recent Labs    03/16/23 1856  PROT 6.5  ALBUMIN 2.7*  AST 19  ALT 5  ALKPHOS 104  BILITOT 0.2*   PT/INR Recent Labs    03/17/23 0012  LABPROT 17.9*  INR 1.5*    Studies/Results: VAS [place] LOWER EXTREMITY VENOUS (DVT)  Result Date: 03/17/2023  Lower Venous DVT Study Patient Name:  William Hunter  Date of Exam:   03/17/2023 Medical Rec #: [id number]      Accession #:    [id number] Date of Birth: 1946-03-05      Patient Gender: M Patient Age:   35  years Exam Location:  [facility] Procedure:      VAS [place] LOWER EXTREMITY VENOUS (DVT) Referring Phys: Enid Derry HALL --------------------------------------------------------------------------------  Indications: Chest pain, systemic anemia.  Risk Factors: PE 11/26/21 DVT 11/27/21. Anticoagulation: Eliquis. Limitations: Body habitus Comparison Study: Prior bilateral LEV done 11/27/21 indicating acute DVT in the                   left SFJ, common femoral, femoral, popliteal, and peroneal                   veins Performing Technologist: Sherren Kerns RVS  Examination Guidelines: A complete evaluation includes B-mode imaging, spectral Doppler, color Doppler, and power Doppler as needed of all accessible portions of each vessel. Bilateral testing is considered an integral part of a complete examination. Limited examinations for reoccurring indications may be performed as noted. The reflux portion of the exam is performed with the patient in reverse Trendelenburg.  +---------+---------------+---------+-----------+----------+---------------+ RIGHT    CompressibilityPhasicitySpontaneityPropertiesThrombus Aging  +---------+---------------+---------+-----------+----------+---------------+  CFV      Full           Yes      Yes                                  +---------+---------------+---------+-----------+----------+---------------+ SFJ      Full                                                         +---------+---------------+---------+-----------+----------+---------------+ FV Prox  Full                                                         +---------+---------------+---------+-----------+----------+---------------+ FV Mid   Full                                                         +---------+---------------+---------+-----------+----------+---------------+ FV DistalFull                                                          +---------+---------------+---------+-----------+----------+---------------+ PFV      Full                                                         +---------+---------------+---------+-----------+----------+---------------+ POP      Full           Yes      Yes                                  +---------+---------------+---------+-----------+----------+---------------+ PTV                                                   patent by color +---------+---------------+---------+-----------+----------+---------------+ PERO                                                  patent by color +---------+---------------+---------+-----------+----------+---------------+   +---------+---------------+---------+-----------+----------+-------------------+ LEFT     CompressibilityPhasicitySpontaneityPropertiesThrombus Aging      +---------+---------------+---------+-----------+----------+-------------------+ CFV      Full           Yes      Yes                                      +---------+---------------+---------+-----------+----------+-------------------+  SFJ      Full                                                             +---------+---------------+---------+-----------+----------+-------------------+ FV Prox  Full                                                             +---------+---------------+---------+-----------+----------+-------------------+ FV Mid   Full                                                             +---------+---------------+---------+-----------+----------+-------------------+ FV Distal               Yes      Yes                  patent by color and                                                       Doppler             +---------+---------------+---------+-----------+----------+-------------------+ PFV      Full                                                              +---------+---------------+---------+-----------+----------+-------------------+ POP      Full           Yes      Yes                                      +---------+---------------+---------+-----------+----------+-------------------+ PTV      Full                                                             +---------+---------------+---------+-----------+----------+-------------------+ PERO     Full                                                             +---------+---------------+---------+-----------+----------+-------------------+     Summary: BILATERAL: - No evidence of deep vein thrombosis seen in the lower extremities, bilaterally. -No evidence of popliteal  cyst, bilaterally.  LEFT: - Findings suggest resolution of previously noted thrombus.  *See table(s) above for measurements and observations.    Preliminary    DG Chest Portable 1 View  Result Date: 03/16/2023 CLINICAL DATA:  Chest pain EXAM: PORTABLE CHEST 1 VIEW COMPARISON:  03/02/2023 FINDINGS: Stable cardiomediastinal silhouette. Aortic atherosclerotic calcification. Low lung volumes accentuate pulmonary vascularity. Chronic bronchitic changes. No focal consolidation, pleural effusion, or pneumothorax. No displaced rib fractures. Vertebroplasty of a midthoracic vertebral body. IMPRESSION: No active disease. Electronically Signed   By: Minerva Fester M.D.   On: 03/16/2023 20:21    Impression: Anemia, FOBT positive stool, iron saturation 15%, ferritin 106, iron 42, TIBC 277  Multiple comorbidities: Chest pain, history of CAD, on Plavix, last was yesterday Left lower extremity DVT, (ultrasound showed resolution of lower extremity thrombus), was on Eliquis, PT 17.9, INR 1.5 Hypertension Diabetes Rheumatoid arthritis Heart failure, diastolic dysfunction   Plan: EGD and colonoscopy on Wednesday Clear liquid diet today Colonic prep to be started tomorrow in a.m. Continue pantoprazole 40 mg twice a day.   LOS:  0 days   Kerin Salen, MD  03/17/2023, 2:10 PM

## 2023-03-17 NOTE — Progress Notes (Signed)
Repeat H&H returned 10.6 and 10.1.  Hemoglobin of 7.6 suspect was erroneous.  DC'd 1 unit PRBCs blood transfusion.

## 2023-03-17 NOTE — Progress Notes (Signed)
PROGRESS NOTE    William Hunter  ZOX:[id number] DOB: 1946-03-19 DOA: 03/16/2023 PCP: Irena Reichmann, DO    Brief Narrative:  77 year old with history of left lower extremity DVT on Eliquis, CAD on Plavix, DM2, HTN, GERD, RA, CHF with preserved EF presented from SNF due to complaints of chest pain.  Patient was given nitro without any improvement.  Upon admission EKG did not show any changes, tropes were negative.  Hemoglobin 8.2 with baseline of 12.  FOBT positive.   Assessment & Plan:  Principal Problem:   Symptomatic anemia    Anemia of chronic disease Appears to have reproducible chest pain upon palpation.  Echo in 09/2022 showed EF of 65% without wall motion abnormality.  Initially thought there was a hemoglobin drop down to 7.6 requiring 1 unit PRBC transfusion.  Baseline hemoglobin around 11.  Will repeat hemoglobin show 10.1 therefore transfusion was canceled.  FOBT positive, admitting provider consulted GI.  Likely can defer workup outpatient   Atypical Chest pain, rule out ACS Sharp and reproducible on palpation History of CAD Now chest pain-free.  EKG nonischemic, troponins are negative. Plavix on hold, resume once cleared by GI   History of left lower extremity DVT (11/27/2021) on Eliquis Known DVT diagnosed back in April 2024.  Patient has been on Eliquis.  Will resume once cleared by GI.   GERD with concern for esophagitis seen on CT scan done on 09/29/2022 Continue IV PPI twice daily   Type 2 diabetes with hyperglycemia Last hemoglobin A1c 6.7 on 09/29/2022 Insulin sliding scale every 4 hours while NPO.   Rheumatoid arthritis Hold off home regimen for now   Hypertension Home BP meds on hold.  IV as needed     DVT prophylaxis: SCDs Code Status: Full code Family Communication:   GI evaluation    Subjective:  Seen at bedside, no new complaints.  Does tell me that he has noticed occasional dark stools especially over the course the last week. Also tells me he  has not really been ambulatory over the last 6 months.  Examination:  General exam: Appears calm and comfortable  Respiratory system: Clear to auscultation. Respiratory effort normal. Cardiovascular system: S1 & S2 heard, RRR. No JVD, murmurs, rubs, gallops or clicks. No pedal edema. Gastrointestinal system: Abdomen is nondistended, soft and nontender. No organomegaly or masses felt. Normal bowel sounds heard. Central nervous system: Alert and oriented. No focal neurological deficits. Extremities: Symmetric 5 x 5 power. Skin: No rashes, lesions or ulcers Psychiatry: Judgement and insight appear normal. Mood & affect appropriate.      Diet Orders (From admission, onward)     Start     Ordered   03/17/23 1146  Diet clear liquid Room service appropriate? Yes; Fluid consistency: Thin  Diet effective now       Question Answer Comment  Room service appropriate? Yes   Fluid consistency: Thin      03/17/23 1145            Objective: Vitals:   03/17/23 1110 03/17/23 1115 03/17/23 1245 03/17/23 1300  BP:  121/64 131/64 131/63  Pulse:  (!) 57 (!) 59 (!) 59  Resp:  16 16 15   Temp: 97.8 F (36.6 C)     TempSrc: Oral     SpO2:  100% 99% 99%  Weight:      Height:        Intake/Output Summary (Last 24 hours) at 03/17/2023 1332 Last data filed at 03/17/2023 0937 Gross per 24  hour  Intake 411.67 ml  Output --  Net 411.67 ml   Filed Weights   03/16/23 1845  Weight: 70.3 kg    Scheduled Meds:  sodium chloride   Intravenous Once   insulin aspart  0-9 Units Subcutaneous Q4H   pantoprazole (PROTONIX) IV  40 mg Intravenous BID   ORION 4 inclisiran or placebo  300 mg Subcutaneous Q6 months   Continuous Infusions:  sodium chloride 50 mL/hr at 03/17/23 0937    Nutritional status     Body mass index is 22.24 kg/m.  Data Reviewed:   CBC: Recent Labs  Lab 03/16/23 1856 03/16/23 2325 03/17/23 0342 03/17/23 0546  WBC 7.0  --  8.9 8.3  NEUTROABS 4.7  --   --   --    HGB 8.2* 7.6* 10.7* 10.1*  HCT 25.0* 22.9* 32.0* 31.0*  MCV 92.3  --  90.4 90.9  PLT 241  --  263 256   Basic Metabolic Panel: Recent Labs  Lab 03/16/23 1856 03/17/23 0253  NA 137 136  K 3.6 3.2*  CL 102 100  CO2 22 23  GLUCOSE 129* 121*  BUN 15 13  CREATININE 0.79 0.66  CALCIUM 8.4* 8.5*  MG  --  1.7  PHOS  --  3.0   GFR: Estimated Creatinine Clearance: 79.3 mL/min (by C-G formula based on SCr of 0.66 mg/dL). Liver Function Tests: Recent Labs  Lab 03/16/23 1856  AST 19  ALT 5  ALKPHOS 104  BILITOT 0.2*  PROT 6.5  ALBUMIN 2.7*   No results for input(s): "LIPASE", "AMYLASE" in the last 168 hours. No results for input(s): "AMMONIA" in the last 168 hours. Coagulation Profile: Recent Labs  Lab 03/17/23 0012  INR 1.5*   Cardiac Enzymes: No results for input(s): "CKTOTAL", "CKMB", "CKMBINDEX", "TROPONINI" in the last 168 hours. BNP (last 3 results) No results for input(s): "PROBNP" in the last 8760 hours. HbA1C: No results for input(s): "HGBA1C" in the last 72 hours. CBG: Recent Labs  Lab 03/17/23 0032 03/17/23 0357 03/17/23 0802 03/17/23 1109  GLUCAP 184* 136* 102* 97   Lipid Profile: No results for input(s): "CHOL", "HDL", "LDLCALC", "TRIG", "CHOLHDL", "LDLDIRECT" in the last 72 hours. Thyroid Function Tests: No results for input(s): "TSH", "T4TOTAL", "FREET4", "T3FREE", "THYROIDAB" in the last 72 hours. Anemia Panel: Recent Labs    03/16/23 2325 03/17/23 0253  FERRITIN  --  106  TIBC  --  277  IRON  --  42*  RETICCTPCT 1.1  --    Sepsis Labs: No results for input(s): "PROCALCITON", "LATICACIDVEN" in the last 168 hours.  No results found for this or any previous visit (from the past 240 hour(s)).       Radiology Studies: DG Chest Portable 1 View  Result Date: 03/16/2023 CLINICAL DATA:  Chest pain EXAM: PORTABLE CHEST 1 VIEW COMPARISON:  03/02/2023 FINDINGS: Stable cardiomediastinal silhouette. Aortic atherosclerotic calcification. Low  lung volumes accentuate pulmonary vascularity. Chronic bronchitic changes. No focal consolidation, pleural effusion, or pneumothorax. No displaced rib fractures. Vertebroplasty of a midthoracic vertebral body. IMPRESSION: No active disease. Electronically Signed   By: Minerva Fester M.D.   On: 03/16/2023 20:21           LOS: 0 days   Time spent= 35 mins    Miguel Rota, MD [facility]  If 7PM-7AM, please contact night-coverage  03/17/2023, 1:32 PM

## 2023-03-17 NOTE — Hospital Course (Signed)
  Brief Narrative:  77 year old with history of left lower extremity DVT on Eliquis, CAD on Plavix, DM2, HTN, GERD, RA, CHF with preserved EF presented from SNF due to complaints of chest pain.  Patient was given nitro without any improvement.  Upon admission EKG did not show any changes, tropes were negative.  Hemoglobin 8.2 with baseline of 12.  FOBT positive.   Assessment & Plan:  Principal Problem:   Symptomatic anemia    Anemia of chronic disease Appears to have reproducible chest pain upon palpation.  Echo in 09/2022 showed EF of 65% without wall motion abnormality.  Initially thought there was a hemoglobin drop down to 7.6 requiring 1 unit PRBC transfusion.  Baseline hemoglobin around 11.  Will repeat hemoglobin show 10.1 therefore transfusion was canceled.  FOBT positive, admitting provider consulted GI.  Likely can defer workup outpatient   Atypical Chest pain, rule out ACS Sharp and reproducible on palpation History of CAD Now chest pain-free.  EKG nonischemic, troponins are negative. Plavix on hold, resume once cleared by GI   History of left lower extremity DVT (11/27/2021) on Eliquis Known DVT diagnosed back in April 2024.  Patient has been on Eliquis.  Will resume once cleared by GI.   GERD with concern for esophagitis seen on CT scan done on 09/29/2022 Continue IV PPI twice daily   Type 2 diabetes with hyperglycemia Last hemoglobin A1c 6.7 on 09/29/2022 Insulin sliding scale every 4 hours while NPO.   Rheumatoid arthritis Hold off home regimen for now   Hypertension Home BP meds on hold.  IV as needed     DVT prophylaxis: SCDs Code Status: Full code Family Communication:   Status is: Inpatient {Inpatient:23812}

## 2023-03-17 NOTE — ED Notes (Signed)
Blood consent signed electronically in chart.

## 2023-03-17 NOTE — Care Management Obs Status (Signed)
MEDICARE OBSERVATION STATUS NOTIFICATION   Patient Details  Name: William Hunter MRN: [id number] Date of Birth: 10/12/1945   Medicare Observation Status Notification Given:  Yes    Oletta Cohn, RN 03/17/2023, 3:25 PM

## 2023-03-17 NOTE — Progress Notes (Signed)
VASCULAR LAB    Bilateral lower extremity venous duplex has been performed.  See CV proc for preliminary results.   Alona Danford, RVT 03/17/2023, 12:05 PM

## 2023-03-18 DIAGNOSIS — R195 Other fecal abnormalities: Secondary | ICD-10-CM | POA: Diagnosis not present

## 2023-03-18 DIAGNOSIS — D649 Anemia, unspecified: Secondary | ICD-10-CM | POA: Diagnosis not present

## 2023-03-18 LAB — GLUCOSE, CAPILLARY
Glucose-Capillary: 104 mg/dL — ABNORMAL HIGH (ref 70–99)
Glucose-Capillary: 105 mg/dL — ABNORMAL HIGH (ref 70–99)
Glucose-Capillary: 111 mg/dL — ABNORMAL HIGH (ref 70–99)
Glucose-Capillary: 150 mg/dL — ABNORMAL HIGH (ref 70–99)
Glucose-Capillary: 84 mg/dL (ref 70–99)
Glucose-Capillary: 97 mg/dL (ref 70–99)

## 2023-03-18 LAB — BASIC METABOLIC PANEL
Anion gap: 11 (ref 5–15)
BUN: 9 mg/dL (ref 8–23)
CO2: 23 mmol/L (ref 22–32)
Calcium: 8.6 mg/dL — ABNORMAL LOW (ref 8.9–10.3)
Chloride: 103 mmol/L (ref 98–111)
Creatinine, Ser: 0.64 mg/dL (ref 0.61–1.24)
GFR, Estimated: >60 mL/min (ref >60)
Glucose, Bld: 92 mg/dL (ref 70–99)
Potassium: 3.3 mmol/L — ABNORMAL LOW (ref 3.5–5.1)
Sodium: 137 mmol/L (ref 135–145)

## 2023-03-18 LAB — MAGNESIUM: Magnesium: 1.8 mg/dL (ref 1.7–2.4)

## 2023-03-18 LAB — CBC
HCT: 37.2 % — ABNORMAL LOW (ref 39.0–52.0)
Hemoglobin: 12.5 g/dL — ABNORMAL LOW (ref 13.0–17.0)
MCH: 30.9 pg (ref 26.0–34.0)
MCHC: 33.6 g/dL (ref 30.0–36.0)
MCV: 92.1 fL (ref 80.0–100.0)
Platelets: 328 10*3/uL (ref 150–400)
RBC: 4.04 MIL/uL — ABNORMAL LOW (ref 4.22–5.81)
RDW: 14.2 % (ref 11.5–15.5)
WBC: 11.5 10*3/uL — ABNORMAL HIGH (ref 4.0–10.5)
nRBC: 0 % (ref 0.0–0.2)

## 2023-03-18 LAB — MRSA NEXT GEN BY PCR, NASAL: MRSA by PCR Next Gen: NOT DETECTED

## 2023-03-18 LAB — PHOSPHORUS: Phosphorus: 3.4 mg/dL (ref 2.5–4.6)

## 2023-03-18 MED ORDER — POTASSIUM CHLORIDE 10 MEQ/100ML IV SOLN
10.0000 meq | INTRAVENOUS | Status: AC
  Administered 2023-03-18 (×4): 10 meq via INTRAVENOUS
  Filled 2023-03-18 (×4): qty 100

## 2023-03-18 MED ORDER — MAGNESIUM OXIDE -MG SUPPLEMENT 400 (240 MG) MG PO TABS
800.0000 mg | ORAL_TABLET | Freq: Once | ORAL | Status: AC
  Administered 2023-03-18: 800 mg via ORAL
  Filled 2023-03-18: qty 2

## 2023-03-18 NOTE — H&P (View-Only) (Signed)
Subjective: Patient states his chest pain has resolved.  Objective: Vital signs in last 24 hours: Temp:  [97.8 F (36.6 C)-98.2 F (36.8 C)] 97.9 F (36.6 C) (10/08 0737) Pulse Rate:  [57-82] 82 (10/08 0737) Resp:  [12-18] 15 (10/08 0737) BP: (101-143)/(59-91) 143/81 (10/08 0737) SpO2:  [98 %-100 %] 98 % (10/08 0737) Weight change:  Last BM Date : 03/17/23  PE: Alert, awake, oriented x 3 GENERAL: No obvious pallor ABDOMEN: Nondistended, nontender, normoactive bowel sounds EXTREMITIES: No deformity  Lab Results: Results for orders placed or performed during the hospital encounter of 03/16/23 (from the past 48 hour(s))  Brain natriuretic peptide     Status: None   Collection Time: 03/16/23  6:34 PM  Result Value Ref Range   B Natriuretic Peptide 46.3 0.0 - 100.0 pg/mL    Comment: Performed at [facility], [address], [place], [place] [address]  ABO/Rh     Status: None   Collection Time: 03/16/23  6:34 PM  Result Value Ref Range   ABO/RH(D)      A POS Performed at [facility], [address] [place], [place] [address]   CBC with Differential     Status: Abnormal   Collection Time: 03/16/23  6:56 PM  Result Value Ref Range   WBC 7.0 4.0 - 10.5 K/uL   RBC 2.71 (L) 4.22 - 5.81 MIL/uL   Hemoglobin 8.2 (L) 13.0 - 17.0 g/dL   HCT 57.8 (L) 46.9 - 62.9 %   MCV 92.3 80.0 - 100.0 fL   MCH 30.3 26.0 - 34.0 pg   MCHC 32.8 30.0 - 36.0 g/dL   RDW 52.8 41.3 - 24.4 %   Platelets 241 150 - 400 K/uL   nRBC 0.0 0.0 - 0.2 %   Neutrophils Relative % 67 %   Neutro Abs 4.7 1.7 - 7.7 K/uL   Lymphocytes Relative 22 %   Lymphs Abs 1.5 0.7 - 4.0 K/uL   Monocytes Relative 7 %   Monocytes Absolute 0.5 0.1 - 1.0 K/uL   Eosinophils Relative 3 %   Eosinophils Absolute 0.2 0.0 - 0.5 K/uL   Basophils Relative 1 %   Basophils Absolute 0.1 0.0 - 0.1 K/uL   Immature Granulocytes 0 %   Abs Immature Granulocytes 0.02 0.00 - 0.07 K/uL    Comment: Performed at [facility]  Lab, [address] [place], [place] [address]  Comprehensive metabolic panel     Status: Abnormal   Collection Time: 03/16/23  6:56 PM  Result Value Ref Range   Sodium 137 135 - 145 mmol/L   Potassium 3.6 3.5 - 5.1 mmol/L   Chloride 102 98 - 111 mmol/L   CO2 22 22 - 32 mmol/L   Glucose, Bld 129 (H) 70 - 99 mg/dL    Comment: Glucose reference range applies only to samples taken after fasting for at least 8 hours.   BUN 15 8 - 23 mg/dL   Creatinine, Ser 2.53 0.61 - 1.24 mg/dL   Calcium 8.4 (L) 8.9 - 10.3 mg/dL   Total Protein 6.5 6.5 - 8.1 g/dL   Albumin 2.7 (L) 3.5 - 5.0 g/dL   AST 19 15 - 41 U/L   ALT 5 0 - 44 U/L   Alkaline Phosphatase 104 38 - 126 U/L   Total Bilirubin 0.2 (L) 0.3 - 1.2 mg/dL   GFR, Estimated >66 >44 mL/min    Comment: (NOTE) Calculated using the CKD-EPI Creatinine Equation (2021)  Anion gap 13 5 - 15    Comment: Performed at [facility] Lab, [address] [place], [place] [address]  Troponin I (High Sensitivity)     Status: None   Collection Time: 03/16/23  6:56 PM  Result Value Ref Range   Troponin I (High Sensitivity) 5 <18 ng/L    Comment: (NOTE) Elevated high sensitivity troponin I (hsTnI) values and significant  changes across serial measurements may suggest ACS but many other  chronic and acute conditions are known to elevate hsTnI results.  Refer to the "Links" section for chest pain algorithms and additional  guidance. Performed at [facility], [address], [place], [place] [address]   Troponin I (High Sensitivity)     Status: None   Collection Time: 03/16/23  8:51 PM  Result Value Ref Range   Troponin I (High Sensitivity) 5 <18 ng/L    Comment: (NOTE) Elevated high sensitivity troponin I (hsTnI) values and significant  changes across serial measurements may suggest ACS but many other  chronic and acute conditions are known to elevate hsTnI results.  Refer to the "Links" section for chest pain algorithms and additional   guidance. Performed at [facility] Lab, [address] [place], [place] [address]   POC occult blood, ED     Status: Abnormal   Collection Time: 03/16/23  9:25 PM  Result Value Ref Range   Fecal Occult Bld POSITIVE (A) NEGATIVE  Reticulocytes     Status: Abnormal   Collection Time: 03/16/23 11:25 PM  Result Value Ref Range   Retic Ct Pct 1.1 0.4 - 3.1 %   RBC. 2.49 (L) 4.22 - 5.81 MIL/uL   Retic Count, Absolute 27.4 19.0 - 186.0 K/uL   Immature Retic Fract 3.6 2.3 - 15.9 %    Comment: Performed at [facility] Lab, [address] [place], [place] [address]  Hemoglobin and hematocrit, blood     Status: Abnormal   Collection Time: 03/16/23 11:25 PM  Result Value Ref Range   Hemoglobin 7.6 (L) 13.0 - 17.0 g/dL   HCT 29.5 (L) 62.1 - 30.8 %    Comment: Performed at [facility] Lab, [address] [place], [place] [address]  Prepare RBC (crossmatch)     Status: None   Collection Time: 03/16/23 11:55 PM  Result Value Ref Range   Order Confirmation      ORDER PROCESSED BY BLOOD BANK Performed at [facility], [address], [place], [place] [address]   Protime-INR     Status: Abnormal   Collection Time: 03/17/23 12:12 AM  Result Value Ref Range   Prothrombin Time 17.9 (H) 11.4 - 15.2 seconds   INR 1.5 (H) 0.8 - 1.2    Comment: (NOTE) INR goal varies based on device and disease states. Performed at [facility] Lab, [address] [place], [place] [address]   Type and screen [facility]     Status: None (Preliminary result)   Collection Time: 03/17/23 12:12 AM  Result Value Ref Range   ABO/RH(D) A POS    Antibody Screen NEG    Sample Expiration      03/20/2023,2359 Performed at [facility] Lab, [address], [place], [place] [address]    Unit Number [id number]    Blood Component Type RED CELLS,LR    Unit division 00    Status of Unit ALLOCATED    Transfusion Status OK TO TRANSFUSE  Crossmatch Result Compatible   CBG  monitoring, ED     Status: Abnormal   Collection Time: 03/17/23 12:32 AM  Result Value Ref Range   Glucose-Capillary 184 (H) 70 - 99 mg/dL    Comment: Glucose reference range applies only to samples taken after fasting for at least 8 hours.  Basic metabolic panel     Status: Abnormal   Collection Time: 03/17/23  2:53 AM  Result Value Ref Range   Sodium 136 135 - 145 mmol/L   Potassium 3.2 (L) 3.5 - 5.1 mmol/L   Chloride 100 98 - 111 mmol/L   CO2 23 22 - 32 mmol/L   Glucose, Bld 121 (H) 70 - 99 mg/dL    Comment: Glucose reference range applies only to samples taken after fasting for at least 8 hours.   BUN 13 8 - 23 mg/dL   Creatinine, Ser 6.57 0.61 - 1.24 mg/dL   Calcium 8.5 (L) 8.9 - 10.3 mg/dL   GFR, Estimated >84 >69 mL/min    Comment: (NOTE) Calculated using the CKD-EPI Creatinine Equation (2021)    Anion gap 13 5 - 15    Comment: Performed at [facility] Lab, [address], [place], [place] [address]  Magnesium     Status: None   Collection Time: 03/17/23  2:53 AM  Result Value Ref Range   Magnesium 1.7 1.7 - 2.4 mg/dL    Comment: Performed at [facility] Lab, [address] [place], [place] [address]  Phosphorus     Status: None   Collection Time: 03/17/23  2:53 AM  Result Value Ref Range   Phosphorus 3.0 2.5 - 4.6 mg/dL    Comment: Performed at [facility], [address], [place], [place] [address]  Iron and TIBC     Status: Abnormal   Collection Time: 03/17/23  2:53 AM  Result Value Ref Range   Iron 42 (L) 45 - 182 ug/dL   TIBC 272 536 - 644 ug/dL   Saturation Ratios 15 (L) 17.9 - 39.5 %   UIBC 235 ug/dL    Comment: Performed at [facility] Lab, [address] [place], [place] [address]  Ferritin     Status: None   Collection Time: 03/17/23  2:53 AM  Result Value Ref Range   Ferritin 106 24 - 336 ng/mL    Comment: Performed at [facility], [address], [place], [place] [address]  CBC     Status: Abnormal   Collection Time: 03/17/23   3:42 AM  Result Value Ref Range   WBC 8.9 4.0 - 10.5 K/uL   RBC 3.54 (L) 4.22 - 5.81 MIL/uL   Hemoglobin 10.7 (L) 13.0 - 17.0 g/dL   HCT 38.7 (L) 56.4 - 33.2 %   MCV 90.4 80.0 - 100.0 fL   MCH 30.2 26.0 - 34.0 pg   MCHC 33.4 30.0 - 36.0 g/dL   RDW 95.1 88.4 - 16.6 %   Platelets 263 150 - 400 K/uL   nRBC 0.0 0.0 - 0.2 %    Comment: Performed at [facility] Lab, [address] [place], [place] [address]  CBG monitoring, ED     Status: Abnormal   Collection Time: 03/17/23  3:57 AM  Result Value Ref Range   Glucose-Capillary 136 (H) 70 - 99 mg/dL    Comment: Glucose reference range applies only to samples taken after fasting for at least 8 hours.  CBC  Status: Abnormal   Collection Time: 03/17/23  5:46 AM  Result Value Ref Range   WBC 8.3 4.0 - 10.5 K/uL   RBC 3.41 (L) 4.22 - 5.81 MIL/uL   Hemoglobin 10.1 (L) 13.0 - 17.0 g/dL   HCT 96.2 (L) 95.2 - 84.1 %   MCV 90.9 80.0 - 100.0 fL   MCH 29.6 26.0 - 34.0 pg   MCHC 32.6 30.0 - 36.0 g/dL   RDW 32.4 40.1 - 02.7 %   Platelets 256 150 - 400 K/uL   nRBC 0.0 0.0 - 0.2 %    Comment: Performed at [facility], [address] [place], [place] [address]  CBG monitoring, ED     Status: Abnormal   Collection Time: 03/17/23  8:02 AM  Result Value Ref Range   Glucose-Capillary 102 (H) 70 - 99 mg/dL    Comment: Glucose reference range applies only to samples taken after fasting for at least 8 hours.  CBG monitoring, ED     Status: None   Collection Time: 03/17/23 11:09 AM  Result Value Ref Range   Glucose-Capillary 97 70 - 99 mg/dL    Comment: Glucose reference range applies only to samples taken after fasting for at least 8 hours.  CBG monitoring, ED     Status: Abnormal   Collection Time: 03/17/23  4:12 PM  Result Value Ref Range   Glucose-Capillary 137 (H) 70 - 99 mg/dL    Comment: Glucose reference range applies only to samples taken after fasting for at least 8 hours.  CBG monitoring, ED     Status: Abnormal   Collection  Time: 03/17/23  4:50 PM  Result Value Ref Range   Glucose-Capillary 116 (H) 70 - 99 mg/dL    Comment: Glucose reference range applies only to samples taken after fasting for at least 8 hours.  Glucose, capillary     Status: Abnormal   Collection Time: 03/17/23  8:29 PM  Result Value Ref Range   Glucose-Capillary 100 (H) 70 - 99 mg/dL    Comment: Glucose reference range applies only to samples taken after fasting for at least 8 hours.  Glucose, capillary     Status: Abnormal   Collection Time: 03/18/23 12:51 AM  Result Value Ref Range   Glucose-Capillary 104 (H) 70 - 99 mg/dL    Comment: Glucose reference range applies only to samples taken after fasting for at least 8 hours.  Glucose, capillary     Status: None   Collection Time: 03/18/23  4:50 AM  Result Value Ref Range   Glucose-Capillary 84 70 - 99 mg/dL    Comment: Glucose reference range applies only to samples taken after fasting for at least 8 hours.  Basic metabolic panel     Status: Abnormal   Collection Time: 03/18/23  5:13 AM  Result Value Ref Range   Sodium 137 135 - 145 mmol/L   Potassium 3.3 (L) 3.5 - 5.1 mmol/L   Chloride 103 98 - 111 mmol/L   CO2 23 22 - 32 mmol/L   Glucose, Bld 92 70 - 99 mg/dL    Comment: Glucose reference range applies only to samples taken after fasting for at least 8 hours.   BUN 9 8 - 23 mg/dL   Creatinine, Ser 4.40 0.61 - 1.24 mg/dL   Calcium 8.6 (L) 8.9 - 10.3 mg/dL   GFR, Estimated >34 >74 mL/min    Comment: (NOTE) Calculated using the CKD-EPI Creatinine Equation (2021)    Anion gap  11 5 - 15    Comment: Performed at [facility] Lab, [address], [place], [place] [address]  CBC     Status: Abnormal   Collection Time: 03/18/23  5:13 AM  Result Value Ref Range   WBC 11.5 (H) 4.0 - 10.5 K/uL   RBC 4.04 (L) 4.22 - 5.81 MIL/uL   Hemoglobin 12.5 (L) 13.0 - 17.0 g/dL   HCT 95.6 (L) 21.3 - 08.6 %   MCV 92.1 80.0 - 100.0 fL   MCH 30.9 26.0 - 34.0 pg   MCHC 33.6 30.0 - 36.0 g/dL    RDW 57.8 46.9 - 62.9 %   Platelets 328 150 - 400 K/uL   nRBC 0.0 0.0 - 0.2 %    Comment: Performed at [facility] Lab, [address], [place], [place] [address]  Magnesium     Status: None   Collection Time: 03/18/23  5:13 AM  Result Value Ref Range   Magnesium 1.8 1.7 - 2.4 mg/dL    Comment: Performed at [facility] Lab, [address] [place], [place] [address]  Phosphorus     Status: None   Collection Time: 03/18/23  5:13 AM  Result Value Ref Range   Phosphorus 3.4 2.5 - 4.6 mg/dL    Comment: Performed at [facility] Lab, [address] [place], [place] [address]  Glucose, capillary     Status: Abnormal   Collection Time: 03/18/23  7:36 AM  Result Value Ref Range   Glucose-Capillary 105 (H) 70 - 99 mg/dL    Comment: Glucose reference range applies only to samples taken after fasting for at least 8 hours.    Studies/Results: VAS [place] LOWER EXTREMITY VENOUS (DVT)  Result Date: 03/17/2023  Lower Venous DVT Study Patient Name:  William Hunter  Date of Exam:   03/17/2023 Medical Rec #: [id number]      Accession #:    [id number] Date of Birth: 1945-06-23      Patient Gender: M Patient Age:   39 years Exam Location:  [facility] Procedure:      VAS [place] LOWER EXTREMITY VENOUS (DVT) Referring Phys: Enid Derry HALL --------------------------------------------------------------------------------  Indications: Chest pain, systemic anemia.  Risk Factors: PE 11/26/21 DVT 11/27/21. Anticoagulation: Eliquis. Limitations: Body habitus Comparison Study: Prior bilateral LEV done 11/27/21 indicating acute DVT in the                   left SFJ, common femoral, femoral, popliteal, and peroneal                   veins Performing Technologist: Sherren Kerns RVS  Examination Guidelines: A complete evaluation includes B-mode imaging, spectral Doppler, color Doppler, and power Doppler as needed of all accessible portions of each vessel. Bilateral testing is considered an integral part of a complete  examination. Limited examinations for reoccurring indications may be performed as noted. The reflux portion of the exam is performed with the patient in reverse Trendelenburg.  +---------+---------------+---------+-----------+----------+---------------+ RIGHT    CompressibilityPhasicitySpontaneityPropertiesThrombus Aging  +---------+---------------+---------+-----------+----------+---------------+ CFV      Full           Yes      Yes                                  +---------+---------------+---------+-----------+----------+---------------+ SFJ      Full                                                         +---------+---------------+---------+-----------+----------+---------------+  FV Prox  Full                                                         +---------+---------------+---------+-----------+----------+---------------+ FV Mid   Full                                                         +---------+---------------+---------+-----------+----------+---------------+ FV DistalFull                                                         +---------+---------------+---------+-----------+----------+---------------+ PFV      Full                                                         +---------+---------------+---------+-----------+----------+---------------+ POP      Full           Yes      Yes                                  +---------+---------------+---------+-----------+----------+---------------+ PTV                                                   patent by color +---------+---------------+---------+-----------+----------+---------------+ PERO                                                  patent by color +---------+---------------+---------+-----------+----------+---------------+   +---------+---------------+---------+-----------+----------+-------------------+ LEFT     CompressibilityPhasicitySpontaneityPropertiesThrombus  Aging      +---------+---------------+---------+-----------+----------+-------------------+ CFV      Full           Yes      Yes                                      +---------+---------------+---------+-----------+----------+-------------------+ SFJ      Full                                                             +---------+---------------+---------+-----------+----------+-------------------+ FV Prox  Full                                                             +---------+---------------+---------+-----------+----------+-------------------+  FV Mid   Full                                                             +---------+---------------+---------+-----------+----------+-------------------+ FV Distal               Yes      Yes                  patent by color and                                                       Doppler             +---------+---------------+---------+-----------+----------+-------------------+ PFV      Full                                                             +---------+---------------+---------+-----------+----------+-------------------+ POP      Full           Yes      Yes                                      +---------+---------------+---------+-----------+----------+-------------------+ PTV      Full                                                             +---------+---------------+---------+-----------+----------+-------------------+ PERO     Full                                                             +---------+---------------+---------+-----------+----------+-------------------+     Summary: BILATERAL: - No evidence of deep vein thrombosis seen in the lower extremities, bilaterally. -No evidence of popliteal cyst, bilaterally.  LEFT: - Findings suggest resolution of previously noted thrombus.  *See table(s) above for measurements and observations. Electronically signed by Gerarda Fraction on  03/17/2023 at 4:49:31 PM.    Final    DG Chest Portable 1 View  Result Date: 03/16/2023 CLINICAL DATA:  Chest pain EXAM: PORTABLE CHEST 1 VIEW COMPARISON:  03/02/2023 FINDINGS: Stable cardiomediastinal silhouette. Aortic atherosclerotic calcification. Low lung volumes accentuate pulmonary vascularity. Chronic bronchitic changes. No focal consolidation, pleural effusion, or pneumothorax. No displaced rib fractures. Vertebroplasty of a midthoracic vertebral body. IMPRESSION: No active disease. Electronically Signed   By: Minerva Fester M.D.   On: 03/16/2023 20:21    Medications: I have reviewed the patient's current medications.  Assessment: Symptomatic anemia Surprisingly hemoglobin is 12.5 today without transfusion FOBT  positive stool  Plan: EGD and colonoscopy planned for tomorrow. Clear liquid diet, colonic prep today, n.p.o. postmidnight. Last dose of Plavix likely on 03/16/2023 Last dose of Eliquis likely on 03/16/2023  Kerin Salen, MD 03/18/2023, 9:18 AM

## 2023-03-18 NOTE — Progress Notes (Signed)
Subjective: Patient states his chest pain has resolved.  Objective: Vital signs in last 24 hours: Temp:  [97.8 F (36.6 C)-98.2 F (36.8 C)] 97.9 F (36.6 C) (10/08 0737) Pulse Rate:  [57-82] 82 (10/08 0737) Resp:  [12-18] 15 (10/08 0737) BP: (101-143)/(59-91) 143/81 (10/08 0737) SpO2:  [98 %-100 %] 98 % (10/08 0737) Weight change:  Last BM Date : 03/17/23  PE: Alert, awake, oriented x 3 GENERAL: No obvious pallor ABDOMEN: Nondistended, nontender, normoactive bowel sounds EXTREMITIES: No deformity  Lab Results: Results for orders placed or performed during the hospital encounter of 03/16/23 (from the past 48 hour(s))  Brain natriuretic peptide     Status: None   Collection Time: 03/16/23  6:34 PM  Result Value Ref Range   B Natriuretic Peptide 46.3 0.0 - 100.0 pg/mL    Comment: Performed at [facility], [address], [place], [place] [address]  ABO/Rh     Status: None   Collection Time: 03/16/23  6:34 PM  Result Value Ref Range   ABO/RH(D)      A POS Performed at [facility], [address] [place], [place] [address]   CBC with Differential     Status: Abnormal   Collection Time: 03/16/23  6:56 PM  Result Value Ref Range   WBC 7.0 4.0 - 10.5 K/uL   RBC 2.71 (L) 4.22 - 5.81 MIL/uL   Hemoglobin 8.2 (L) 13.0 - 17.0 g/dL   HCT 57.8 (L) 46.9 - 62.9 %   MCV 92.3 80.0 - 100.0 fL   MCH 30.3 26.0 - 34.0 pg   MCHC 32.8 30.0 - 36.0 g/dL   RDW 52.8 41.3 - 24.4 %   Platelets 241 150 - 400 K/uL   nRBC 0.0 0.0 - 0.2 %   Neutrophils Relative % 67 %   Neutro Abs 4.7 1.7 - 7.7 K/uL   Lymphocytes Relative 22 %   Lymphs Abs 1.5 0.7 - 4.0 K/uL   Monocytes Relative 7 %   Monocytes Absolute 0.5 0.1 - 1.0 K/uL   Eosinophils Relative 3 %   Eosinophils Absolute 0.2 0.0 - 0.5 K/uL   Basophils Relative 1 %   Basophils Absolute 0.1 0.0 - 0.1 K/uL   Immature Granulocytes 0 %   Abs Immature Granulocytes 0.02 0.00 - 0.07 K/uL    Comment: Performed at [facility]  Lab, [address] [place], [place] [address]  Comprehensive metabolic panel     Status: Abnormal   Collection Time: 03/16/23  6:56 PM  Result Value Ref Range   Sodium 137 135 - 145 mmol/L   Potassium 3.6 3.5 - 5.1 mmol/L   Chloride 102 98 - 111 mmol/L   CO2 22 22 - 32 mmol/L   Glucose, Bld 129 (H) 70 - 99 mg/dL    Comment: Glucose reference range applies only to samples taken after fasting for at least 8 hours.   BUN 15 8 - 23 mg/dL   Creatinine, Ser 2.53 0.61 - 1.24 mg/dL   Calcium 8.4 (L) 8.9 - 10.3 mg/dL   Total Protein 6.5 6.5 - 8.1 g/dL   Albumin 2.7 (L) 3.5 - 5.0 g/dL   AST 19 15 - 41 U/L   ALT 5 0 - 44 U/L   Alkaline Phosphatase 104 38 - 126 U/L   Total Bilirubin 0.2 (L) 0.3 - 1.2 mg/dL   GFR, Estimated >66 >44 mL/min    Comment: (NOTE) Calculated using the CKD-EPI Creatinine Equation (2021)  Anion gap 13 5 - 15    Comment: Performed at [facility] Lab, [address] [place], [place] [address]  Troponin I (High Sensitivity)     Status: None   Collection Time: 03/16/23  6:56 PM  Result Value Ref Range   Troponin I (High Sensitivity) 5 <18 ng/L    Comment: (NOTE) Elevated high sensitivity troponin I (hsTnI) values and significant  changes across serial measurements may suggest ACS but many other  chronic and acute conditions are known to elevate hsTnI results.  Refer to the "Links" section for chest pain algorithms and additional  guidance. Performed at [facility], [address], [place], [place] [address]   Troponin I (High Sensitivity)     Status: None   Collection Time: 03/16/23  8:51 PM  Result Value Ref Range   Troponin I (High Sensitivity) 5 <18 ng/L    Comment: (NOTE) Elevated high sensitivity troponin I (hsTnI) values and significant  changes across serial measurements may suggest ACS but many other  chronic and acute conditions are known to elevate hsTnI results.  Refer to the "Links" section for chest pain algorithms and additional   guidance. Performed at [facility] Lab, [address] [place], [place] [address]   POC occult blood, ED     Status: Abnormal   Collection Time: 03/16/23  9:25 PM  Result Value Ref Range   Fecal Occult Bld POSITIVE (A) NEGATIVE  Reticulocytes     Status: Abnormal   Collection Time: 03/16/23 11:25 PM  Result Value Ref Range   Retic Ct Pct 1.1 0.4 - 3.1 %   RBC. 2.49 (L) 4.22 - 5.81 MIL/uL   Retic Count, Absolute 27.4 19.0 - 186.0 K/uL   Immature Retic Fract 3.6 2.3 - 15.9 %    Comment: Performed at [facility] Lab, [address] [place], [place] [address]  Hemoglobin and hematocrit, blood     Status: Abnormal   Collection Time: 03/16/23 11:25 PM  Result Value Ref Range   Hemoglobin 7.6 (L) 13.0 - 17.0 g/dL   HCT 29.5 (L) 62.1 - 30.8 %    Comment: Performed at [facility] Lab, [address] [place], [place] [address]  Prepare RBC (crossmatch)     Status: None   Collection Time: 03/16/23 11:55 PM  Result Value Ref Range   Order Confirmation      ORDER PROCESSED BY BLOOD BANK Performed at [facility], [address], [place], [place] [address]   Protime-INR     Status: Abnormal   Collection Time: 03/17/23 12:12 AM  Result Value Ref Range   Prothrombin Time 17.9 (H) 11.4 - 15.2 seconds   INR 1.5 (H) 0.8 - 1.2    Comment: (NOTE) INR goal varies based on device and disease states. Performed at [facility] Lab, [address] [place], [place] [address]   Type and screen [facility]     Status: None (Preliminary result)   Collection Time: 03/17/23 12:12 AM  Result Value Ref Range   ABO/RH(D) A POS    Antibody Screen NEG    Sample Expiration      03/20/2023,2359 Performed at [facility] Lab, [address], [place], [place] [address]    Unit Number [id number]    Blood Component Type RED CELLS,LR    Unit division 00    Status of Unit ALLOCATED    Transfusion Status OK TO TRANSFUSE  Crossmatch Result Compatible   CBG  monitoring, ED     Status: Abnormal   Collection Time: 03/17/23 12:32 AM  Result Value Ref Range   Glucose-Capillary 184 (H) 70 - 99 mg/dL    Comment: Glucose reference range applies only to samples taken after fasting for at least 8 hours.  Basic metabolic panel     Status: Abnormal   Collection Time: 03/17/23  2:53 AM  Result Value Ref Range   Sodium 136 135 - 145 mmol/L   Potassium 3.2 (L) 3.5 - 5.1 mmol/L   Chloride 100 98 - 111 mmol/L   CO2 23 22 - 32 mmol/L   Glucose, Bld 121 (H) 70 - 99 mg/dL    Comment: Glucose reference range applies only to samples taken after fasting for at least 8 hours.   BUN 13 8 - 23 mg/dL   Creatinine, Ser 6.57 0.61 - 1.24 mg/dL   Calcium 8.5 (L) 8.9 - 10.3 mg/dL   GFR, Estimated >84 >69 mL/min    Comment: (NOTE) Calculated using the CKD-EPI Creatinine Equation (2021)    Anion gap 13 5 - 15    Comment: Performed at [facility] Lab, [address], [place], [place] [address]  Magnesium     Status: None   Collection Time: 03/17/23  2:53 AM  Result Value Ref Range   Magnesium 1.7 1.7 - 2.4 mg/dL    Comment: Performed at [facility] Lab, [address] [place], [place] [address]  Phosphorus     Status: None   Collection Time: 03/17/23  2:53 AM  Result Value Ref Range   Phosphorus 3.0 2.5 - 4.6 mg/dL    Comment: Performed at [facility] Lab, [address], [place], [place] [address]  Iron and TIBC     Status: Abnormal   Collection Time: 03/17/23  2:53 AM  Result Value Ref Range   Iron 42 (L) 45 - 182 ug/dL   TIBC 272 536 - 644 ug/dL   Saturation Ratios 15 (L) 17.9 - 39.5 %   UIBC 235 ug/dL    Comment: Performed at [facility] Lab, [address] [place], [place] [address]  Ferritin     Status: None   Collection Time: 03/17/23  2:53 AM  Result Value Ref Range   Ferritin 106 24 - 336 ng/mL    Comment: Performed at [facility], [address], [place], [place] [address]  CBC     Status: Abnormal   Collection Time: 03/17/23   3:42 AM  Result Value Ref Range   WBC 8.9 4.0 - 10.5 K/uL   RBC 3.54 (L) 4.22 - 5.81 MIL/uL   Hemoglobin 10.7 (L) 13.0 - 17.0 g/dL   HCT 38.7 (L) 56.4 - 33.2 %   MCV 90.4 80.0 - 100.0 fL   MCH 30.2 26.0 - 34.0 pg   MCHC 33.4 30.0 - 36.0 g/dL   RDW 95.1 88.4 - 16.6 %   Platelets 263 150 - 400 K/uL   nRBC 0.0 0.0 - 0.2 %    Comment: Performed at [facility] Lab, [address] [place], [place] [address]  CBG monitoring, ED     Status: Abnormal   Collection Time: 03/17/23  3:57 AM  Result Value Ref Range   Glucose-Capillary 136 (H) 70 - 99 mg/dL    Comment: Glucose reference range applies only to samples taken after fasting for at least 8 hours.  CBC  Status: Abnormal   Collection Time: 03/17/23  5:46 AM  Result Value Ref Range   WBC 8.3 4.0 - 10.5 K/uL   RBC 3.41 (L) 4.22 - 5.81 MIL/uL   Hemoglobin 10.1 (L) 13.0 - 17.0 g/dL   HCT 96.2 (L) 95.2 - 84.1 %   MCV 90.9 80.0 - 100.0 fL   MCH 29.6 26.0 - 34.0 pg   MCHC 32.6 30.0 - 36.0 g/dL   RDW 32.4 40.1 - 02.7 %   Platelets 256 150 - 400 K/uL   nRBC 0.0 0.0 - 0.2 %    Comment: Performed at [facility], [address] [place], [place] [address]  CBG monitoring, ED     Status: Abnormal   Collection Time: 03/17/23  8:02 AM  Result Value Ref Range   Glucose-Capillary 102 (H) 70 - 99 mg/dL    Comment: Glucose reference range applies only to samples taken after fasting for at least 8 hours.  CBG monitoring, ED     Status: None   Collection Time: 03/17/23 11:09 AM  Result Value Ref Range   Glucose-Capillary 97 70 - 99 mg/dL    Comment: Glucose reference range applies only to samples taken after fasting for at least 8 hours.  CBG monitoring, ED     Status: Abnormal   Collection Time: 03/17/23  4:12 PM  Result Value Ref Range   Glucose-Capillary 137 (H) 70 - 99 mg/dL    Comment: Glucose reference range applies only to samples taken after fasting for at least 8 hours.  CBG monitoring, ED     Status: Abnormal   Collection  Time: 03/17/23  4:50 PM  Result Value Ref Range   Glucose-Capillary 116 (H) 70 - 99 mg/dL    Comment: Glucose reference range applies only to samples taken after fasting for at least 8 hours.  Glucose, capillary     Status: Abnormal   Collection Time: 03/17/23  8:29 PM  Result Value Ref Range   Glucose-Capillary 100 (H) 70 - 99 mg/dL    Comment: Glucose reference range applies only to samples taken after fasting for at least 8 hours.  Glucose, capillary     Status: Abnormal   Collection Time: 03/18/23 12:51 AM  Result Value Ref Range   Glucose-Capillary 104 (H) 70 - 99 mg/dL    Comment: Glucose reference range applies only to samples taken after fasting for at least 8 hours.  Glucose, capillary     Status: None   Collection Time: 03/18/23  4:50 AM  Result Value Ref Range   Glucose-Capillary 84 70 - 99 mg/dL    Comment: Glucose reference range applies only to samples taken after fasting for at least 8 hours.  Basic metabolic panel     Status: Abnormal   Collection Time: 03/18/23  5:13 AM  Result Value Ref Range   Sodium 137 135 - 145 mmol/L   Potassium 3.3 (L) 3.5 - 5.1 mmol/L   Chloride 103 98 - 111 mmol/L   CO2 23 22 - 32 mmol/L   Glucose, Bld 92 70 - 99 mg/dL    Comment: Glucose reference range applies only to samples taken after fasting for at least 8 hours.   BUN 9 8 - 23 mg/dL   Creatinine, Ser 4.40 0.61 - 1.24 mg/dL   Calcium 8.6 (L) 8.9 - 10.3 mg/dL   GFR, Estimated >34 >74 mL/min    Comment: (NOTE) Calculated using the CKD-EPI Creatinine Equation (2021)    Anion gap  11 5 - 15    Comment: Performed at [facility] Lab, [address], [place], [place] [address]  CBC     Status: Abnormal   Collection Time: 03/18/23  5:13 AM  Result Value Ref Range   WBC 11.5 (H) 4.0 - 10.5 K/uL   RBC 4.04 (L) 4.22 - 5.81 MIL/uL   Hemoglobin 12.5 (L) 13.0 - 17.0 g/dL   HCT 95.6 (L) 21.3 - 08.6 %   MCV 92.1 80.0 - 100.0 fL   MCH 30.9 26.0 - 34.0 pg   MCHC 33.6 30.0 - 36.0 g/dL    RDW 57.8 46.9 - 62.9 %   Platelets 328 150 - 400 K/uL   nRBC 0.0 0.0 - 0.2 %    Comment: Performed at [facility] Lab, [address], [place], [place] [address]  Magnesium     Status: None   Collection Time: 03/18/23  5:13 AM  Result Value Ref Range   Magnesium 1.8 1.7 - 2.4 mg/dL    Comment: Performed at [facility] Lab, [address] [place], [place] [address]  Phosphorus     Status: None   Collection Time: 03/18/23  5:13 AM  Result Value Ref Range   Phosphorus 3.4 2.5 - 4.6 mg/dL    Comment: Performed at [facility] Lab, [address] [place], [place] [address]  Glucose, capillary     Status: Abnormal   Collection Time: 03/18/23  7:36 AM  Result Value Ref Range   Glucose-Capillary 105 (H) 70 - 99 mg/dL    Comment: Glucose reference range applies only to samples taken after fasting for at least 8 hours.    Studies/Results: VAS [place] LOWER EXTREMITY VENOUS (DVT)  Result Date: 03/17/2023  Lower Venous DVT Study Patient Name:  William Hunter  Date of Exam:   03/17/2023 Medical Rec #: [id number]      Accession #:    [id number] Date of Birth: 1945-06-23      Patient Gender: M Patient Age:   39 years Exam Location:  [facility] Procedure:      VAS [place] LOWER EXTREMITY VENOUS (DVT) Referring Phys: Enid Derry HALL --------------------------------------------------------------------------------  Indications: Chest pain, systemic anemia.  Risk Factors: PE 11/26/21 DVT 11/27/21. Anticoagulation: Eliquis. Limitations: Body habitus Comparison Study: Prior bilateral LEV done 11/27/21 indicating acute DVT in the                   left SFJ, common femoral, femoral, popliteal, and peroneal                   veins Performing Technologist: Sherren Kerns RVS  Examination Guidelines: A complete evaluation includes B-mode imaging, spectral Doppler, color Doppler, and power Doppler as needed of all accessible portions of each vessel. Bilateral testing is considered an integral part of a complete  examination. Limited examinations for reoccurring indications may be performed as noted. The reflux portion of the exam is performed with the patient in reverse Trendelenburg.  +---------+---------------+---------+-----------+----------+---------------+ RIGHT    CompressibilityPhasicitySpontaneityPropertiesThrombus Aging  +---------+---------------+---------+-----------+----------+---------------+ CFV      Full           Yes      Yes                                  +---------+---------------+---------+-----------+----------+---------------+ SFJ      Full                                                         +---------+---------------+---------+-----------+----------+---------------+  FV Prox  Full                                                         +---------+---------------+---------+-----------+----------+---------------+ FV Mid   Full                                                         +---------+---------------+---------+-----------+----------+---------------+ FV DistalFull                                                         +---------+---------------+---------+-----------+----------+---------------+ PFV      Full                                                         +---------+---------------+---------+-----------+----------+---------------+ POP      Full           Yes      Yes                                  +---------+---------------+---------+-----------+----------+---------------+ PTV                                                   patent by color +---------+---------------+---------+-----------+----------+---------------+ PERO                                                  patent by color +---------+---------------+---------+-----------+----------+---------------+   +---------+---------------+---------+-----------+----------+-------------------+ LEFT     CompressibilityPhasicitySpontaneityPropertiesThrombus  Aging      +---------+---------------+---------+-----------+----------+-------------------+ CFV      Full           Yes      Yes                                      +---------+---------------+---------+-----------+----------+-------------------+ SFJ      Full                                                             +---------+---------------+---------+-----------+----------+-------------------+ FV Prox  Full                                                             +---------+---------------+---------+-----------+----------+-------------------+  FV Mid   Full                                                             +---------+---------------+---------+-----------+----------+-------------------+ FV Distal               Yes      Yes                  patent by color and                                                       Doppler             +---------+---------------+---------+-----------+----------+-------------------+ PFV      Full                                                             +---------+---------------+---------+-----------+----------+-------------------+ POP      Full           Yes      Yes                                      +---------+---------------+---------+-----------+----------+-------------------+ PTV      Full                                                             +---------+---------------+---------+-----------+----------+-------------------+ PERO     Full                                                             +---------+---------------+---------+-----------+----------+-------------------+     Summary: BILATERAL: - No evidence of deep vein thrombosis seen in the lower extremities, bilaterally. -No evidence of popliteal cyst, bilaterally.  LEFT: - Findings suggest resolution of previously noted thrombus.  *See table(s) above for measurements and observations. Electronically signed by Gerarda Fraction on  03/17/2023 at 4:49:31 PM.    Final    DG Chest Portable 1 View  Result Date: 03/16/2023 CLINICAL DATA:  Chest pain EXAM: PORTABLE CHEST 1 VIEW COMPARISON:  03/02/2023 FINDINGS: Stable cardiomediastinal silhouette. Aortic atherosclerotic calcification. Low lung volumes accentuate pulmonary vascularity. Chronic bronchitic changes. No focal consolidation, pleural effusion, or pneumothorax. No displaced rib fractures. Vertebroplasty of a midthoracic vertebral body. IMPRESSION: No active disease. Electronically Signed   By: Minerva Fester M.D.   On: 03/16/2023 20:21    Medications: I have reviewed the patient's current medications.  Assessment: Symptomatic anemia Surprisingly hemoglobin is 12.5 today without transfusion FOBT  positive stool  Plan: EGD and colonoscopy planned for tomorrow. Clear liquid diet, colonic prep today, n.p.o. postmidnight. Last dose of Plavix likely on 03/16/2023 Last dose of Eliquis likely on 03/16/2023  Kerin Salen, MD 03/18/2023, 9:18 AM

## 2023-03-18 NOTE — Progress Notes (Signed)
at 03/18/2023 0537 Gross per 24 hour  Intake --  Output 1600 ml  Net -1600 ml   Filed Weights   03/16/23 1845  Weight: 70.3 kg    Scheduled Meds:  sodium chloride   Intravenous Once   feeding supplement  1 Container Oral TID BM   insulin aspart  0-9 Units Subcutaneous Q4H   pantoprazole (PROTONIX) IV  40 mg Intravenous BID   Continuous Infusions:  potassium chloride 10 mEq (03/18/23 1018)    Nutritional status     Body mass index is 22.24 kg/m.  Data Reviewed:   CBC: Recent Labs  Lab 03/16/23 1856 03/16/23 2325 03/17/23 0342 03/17/23 0546 03/18/23 0513   WBC 7.0  --  8.9 8.3 11.5*  NEUTROABS 4.7  --   --   --   --   HGB 8.2* 7.6* 10.7* 10.1* 12.5*  HCT 25.0* 22.9* 32.0* 31.0* 37.2*  MCV 92.3  --  90.4 90.9 92.1  PLT 241  --  263 256 328   Basic Metabolic Panel: Recent Labs  Lab 03/16/23 1856 03/17/23 0253 03/18/23 0513  NA 137 136 137  K 3.6 3.2* 3.3*  CL 102 100 103  CO2 22 23 23   GLUCOSE 129* 121* 92  BUN 15 13 9   CREATININE 0.79 0.66 0.64  CALCIUM 8.4* 8.5* 8.6*  MG  --  1.7 1.8  PHOS  --  3.0 3.4   GFR: Estimated Creatinine Clearance: 79.3 mL/min (by C-G formula based on SCr of 0.64 mg/dL). Liver Function Tests: Recent Labs  Lab 03/16/23 1856  AST 19  ALT 5  ALKPHOS 104  BILITOT 0.2*  PROT 6.5  ALBUMIN 2.7*   No results for input(s): "LIPASE", "AMYLASE" in the last 168 hours. No results for input(s): "AMMONIA" in the last 168 hours. Coagulation Profile: Recent Labs  Lab 03/17/23 0012  INR 1.5*   Cardiac Enzymes: No results for input(s): "CKTOTAL", "CKMB", "CKMBINDEX", "TROPONINI" in the last 168 hours. BNP (last 3 results) No results for input(s): "PROBNP" in the last 8760 hours. HbA1C: No results for input(s): "HGBA1C" in the last 72 hours. CBG: Recent Labs  Lab 03/17/23 1650 03/17/23 2029 03/18/23 0051 03/18/23 0450 03/18/23 0736  GLUCAP 116* 100* 104* 84 105*   Lipid Profile: No results for input(s): "CHOL", "HDL", "LDLCALC", "TRIG", "CHOLHDL", "LDLDIRECT" in the last 72 hours. Thyroid Function Tests: No results for input(s): "TSH", "T4TOTAL", "FREET4", "T3FREE", "THYROIDAB" in the last 72 hours. Anemia Panel: Recent Labs    03/16/23 2325 03/17/23 0253  FERRITIN  --  106  TIBC  --  277  IRON  --  42*  RETICCTPCT 1.1  --    Sepsis Labs: No results for input(s): "PROCALCITON", "LATICACIDVEN" in the last 168 hours.  No results found for this or any previous visit (from the past 240 hour(s)).       Radiology Studies: VAS [place] LOWER EXTREMITY VENOUS (DVT)  Result Date:  03/17/2023  Lower Venous DVT Study Patient Name:  William Hunter  Date of Exam:   03/17/2023 Medical Rec #: [id number]      Accession #:    [id number] Date of Birth: September 21, 1945      Patient Gender: M Patient Age:   77 years Exam Location:  [facility] Procedure:      VAS [place] LOWER EXTREMITY VENOUS (DVT) Referring Phys: William Hunter --------------------------------------------------------------------------------  Indications: Chest pain, systemic anemia.  Risk Factors: PE 11/26/21 DVT 11/27/21. Anticoagulation: Eliquis. Limitations: Body habitus Comparison Study: Prior  PROGRESS NOTE    William Hunter  ZOX:[id number] DOB: 08-21-1945 DOA: 03/16/2023 PCP: Irena Reichmann, DO     Brief Narrative:  77 year old with history of left lower extremity DVT on Eliquis, CAD on Plavix, DM2, HTN, GERD, RA, CHF with preserved EF presented from SNF due to complaints of chest pain.  Patient was given nitro without any improvement.  Upon admission EKG did not show any changes, tropes were negative.  Hemoglobin 8.2 with baseline of 12.  FOBT positive.  Currently Plavix and Eliquis is on hold, [facility] planning on EGD/colonoscopy.  PPI twice daily.   Assessment & Plan:  Principal Problem:   Symptomatic anemia    Anemia of chronic disease Appears to have reproducible chest pain upon palpation.  Echo in 09/2022 showed EF of 65% without wall motion abnormality.  Hemoglobin is overall stable but due to anticoagulation/antiplatelet [facility] planning on EGD/colonoscopy.  Continue PPI twice daily for now.   Atypical Chest pain, rule out ACS Sharp and reproducible on palpation History of CAD Now chest pain-free.  EKG nonischemic, troponins are negative. Plavix on hold, resume once cleared by GI   History of left lower extremity DVT (11/27/2021) on Eliquis Known DVT diagnosed back in April 2024.  Patient has been on Eliquis.  Will resume once cleared by GI.   GERD with concern for esophagitis seen on CT scan done on 09/29/2022 Continue IV PPI twice daily   Type 2 diabetes with hyperglycemia Last hemoglobin A1c 6.7 on 09/29/2022 Insulin sliding scale every 4 hours while NPO.   Rheumatoid arthritis Hold off home regimen for now   Hypertension Home BP meds on hold.  IV as needed   Hypokalemia/hypomagnesemia - As needed repletion  DVT prophylaxis: SCDs Code Status: Full code Family Communication:   Continue hospital stay until EGD/colonoscopy      Subjective: Denies any bleeding overnight.  No other complaints   Examination:  General exam: Appears calm and  comfortable  Respiratory system: Clear to auscultation. Respiratory effort normal. Cardiovascular system: S1 & S2 heard, RRR. No JVD, murmurs, rubs, gallops or clicks. No pedal edema. Gastrointestinal system: Abdomen is nondistended, soft and nontender. No organomegaly or masses felt. Normal bowel sounds heard. Central nervous system: Alert and oriented. No focal neurological deficits. Extremities: Symmetric 5 x 5 power. Skin: No rashes, lesions or ulcers Psychiatry: Judgement and insight appear normal. Mood & affect appropriate.       Diet Orders (From admission, onward)     Start     Ordered   03/19/23 0001  Diet NPO time specified  Diet effective midnight        03/18/23 0922   03/17/23 1146  Diet clear liquid Room service appropriate? Yes; Fluid consistency: Thin  Diet effective now       Question Answer Comment  Room service appropriate? Yes   Fluid consistency: Thin      03/17/23 1145            Objective: Vitals:   03/17/23 1810 03/17/23 2033 03/18/23 0523 03/18/23 0737  BP: 128/69 (!) 141/76 (!) 102/59 (!) 143/81  Pulse: 74 82 82 82  Resp: 18  18 15   Temp: 98.2 F (36.8 C) 98.2 F (36.8 C) 98.2 F (36.8 C) 97.9 F (36.6 C)  TempSrc: Oral Oral Oral Oral  SpO2: 100% 98% 98% 98%  Weight:      Height:        Intake/Output Summary (Last 24 hours) at 03/18/2023 1133 Last data filed  PROGRESS NOTE    William Hunter  ZOX:[id number] DOB: 08-21-1945 DOA: 03/16/2023 PCP: Irena Reichmann, DO     Brief Narrative:  77 year old with history of left lower extremity DVT on Eliquis, CAD on Plavix, DM2, HTN, GERD, RA, CHF with preserved EF presented from SNF due to complaints of chest pain.  Patient was given nitro without any improvement.  Upon admission EKG did not show any changes, tropes were negative.  Hemoglobin 8.2 with baseline of 12.  FOBT positive.  Currently Plavix and Eliquis is on hold, [facility] planning on EGD/colonoscopy.  PPI twice daily.   Assessment & Plan:  Principal Problem:   Symptomatic anemia    Anemia of chronic disease Appears to have reproducible chest pain upon palpation.  Echo in 09/2022 showed EF of 65% without wall motion abnormality.  Hemoglobin is overall stable but due to anticoagulation/antiplatelet [facility] planning on EGD/colonoscopy.  Continue PPI twice daily for now.   Atypical Chest pain, rule out ACS Sharp and reproducible on palpation History of CAD Now chest pain-free.  EKG nonischemic, troponins are negative. Plavix on hold, resume once cleared by GI   History of left lower extremity DVT (11/27/2021) on Eliquis Known DVT diagnosed back in April 2024.  Patient has been on Eliquis.  Will resume once cleared by GI.   GERD with concern for esophagitis seen on CT scan done on 09/29/2022 Continue IV PPI twice daily   Type 2 diabetes with hyperglycemia Last hemoglobin A1c 6.7 on 09/29/2022 Insulin sliding scale every 4 hours while NPO.   Rheumatoid arthritis Hold off home regimen for now   Hypertension Home BP meds on hold.  IV as needed   Hypokalemia/hypomagnesemia - As needed repletion  DVT prophylaxis: SCDs Code Status: Full code Family Communication:   Continue hospital stay until EGD/colonoscopy      Subjective: Denies any bleeding overnight.  No other complaints   Examination:  General exam: Appears calm and  comfortable  Respiratory system: Clear to auscultation. Respiratory effort normal. Cardiovascular system: S1 & S2 heard, RRR. No JVD, murmurs, rubs, gallops or clicks. No pedal edema. Gastrointestinal system: Abdomen is nondistended, soft and nontender. No organomegaly or masses felt. Normal bowel sounds heard. Central nervous system: Alert and oriented. No focal neurological deficits. Extremities: Symmetric 5 x 5 power. Skin: No rashes, lesions or ulcers Psychiatry: Judgement and insight appear normal. Mood & affect appropriate.       Diet Orders (From admission, onward)     Start     Ordered   03/19/23 0001  Diet NPO time specified  Diet effective midnight        03/18/23 0922   03/17/23 1146  Diet clear liquid Room service appropriate? Yes; Fluid consistency: Thin  Diet effective now       Question Answer Comment  Room service appropriate? Yes   Fluid consistency: Thin      03/17/23 1145            Objective: Vitals:   03/17/23 1810 03/17/23 2033 03/18/23 0523 03/18/23 0737  BP: 128/69 (!) 141/76 (!) 102/59 (!) 143/81  Pulse: 74 82 82 82  Resp: 18  18 15   Temp: 98.2 F (36.8 C) 98.2 F (36.8 C) 98.2 F (36.8 C) 97.9 F (36.6 C)  TempSrc: Oral Oral Oral Oral  SpO2: 100% 98% 98% 98%  Weight:      Height:        Intake/Output Summary (Last 24 hours) at 03/18/2023 1133 Last data filed  at 03/18/2023 0537 Gross per 24 hour  Intake --  Output 1600 ml  Net -1600 ml   Filed Weights   03/16/23 1845  Weight: 70.3 kg    Scheduled Meds:  sodium chloride   Intravenous Once   feeding supplement  1 Container Oral TID BM   insulin aspart  0-9 Units Subcutaneous Q4H   pantoprazole (PROTONIX) IV  40 mg Intravenous BID   Continuous Infusions:  potassium chloride 10 mEq (03/18/23 1018)    Nutritional status     Body mass index is 22.24 kg/m.  Data Reviewed:   CBC: Recent Labs  Lab 03/16/23 1856 03/16/23 2325 03/17/23 0342 03/17/23 0546 03/18/23 0513   WBC 7.0  --  8.9 8.3 11.5*  NEUTROABS 4.7  --   --   --   --   HGB 8.2* 7.6* 10.7* 10.1* 12.5*  HCT 25.0* 22.9* 32.0* 31.0* 37.2*  MCV 92.3  --  90.4 90.9 92.1  PLT 241  --  263 256 328   Basic Metabolic Panel: Recent Labs  Lab 03/16/23 1856 03/17/23 0253 03/18/23 0513  NA 137 136 137  K 3.6 3.2* 3.3*  CL 102 100 103  CO2 22 23 23   GLUCOSE 129* 121* 92  BUN 15 13 9   CREATININE 0.79 0.66 0.64  CALCIUM 8.4* 8.5* 8.6*  MG  --  1.7 1.8  PHOS  --  3.0 3.4   GFR: Estimated Creatinine Clearance: 79.3 mL/min (by C-G formula based on SCr of 0.64 mg/dL). Liver Function Tests: Recent Labs  Lab 03/16/23 1856  AST 19  ALT 5  ALKPHOS 104  BILITOT 0.2*  PROT 6.5  ALBUMIN 2.7*   No results for input(s): "LIPASE", "AMYLASE" in the last 168 hours. No results for input(s): "AMMONIA" in the last 168 hours. Coagulation Profile: Recent Labs  Lab 03/17/23 0012  INR 1.5*   Cardiac Enzymes: No results for input(s): "CKTOTAL", "CKMB", "CKMBINDEX", "TROPONINI" in the last 168 hours. BNP (last 3 results) No results for input(s): "PROBNP" in the last 8760 hours. HbA1C: No results for input(s): "HGBA1C" in the last 72 hours. CBG: Recent Labs  Lab 03/17/23 1650 03/17/23 2029 03/18/23 0051 03/18/23 0450 03/18/23 0736  GLUCAP 116* 100* 104* 84 105*   Lipid Profile: No results for input(s): "CHOL", "HDL", "LDLCALC", "TRIG", "CHOLHDL", "LDLDIRECT" in the last 72 hours. Thyroid Function Tests: No results for input(s): "TSH", "T4TOTAL", "FREET4", "T3FREE", "THYROIDAB" in the last 72 hours. Anemia Panel: Recent Labs    03/16/23 2325 03/17/23 0253  FERRITIN  --  106  TIBC  --  277  IRON  --  42*  RETICCTPCT 1.1  --    Sepsis Labs: No results for input(s): "PROCALCITON", "LATICACIDVEN" in the last 168 hours.  No results found for this or any previous visit (from the past 240 hour(s)).       Radiology Studies: VAS [place] LOWER EXTREMITY VENOUS (DVT)  Result Date:  03/17/2023  Lower Venous DVT Study Patient Name:  William Hunter  Date of Exam:   03/17/2023 Medical Rec #: [id number]      Accession #:    [id number] Date of Birth: September 21, 1945      Patient Gender: M Patient Age:   77 years Exam Location:  [facility] Procedure:      VAS [place] LOWER EXTREMITY VENOUS (DVT) Referring Phys: William Hunter --------------------------------------------------------------------------------  Indications: Chest pain, systemic anemia.  Risk Factors: PE 11/26/21 DVT 11/27/21. Anticoagulation: Eliquis. Limitations: Body habitus Comparison Study: Prior  PROGRESS NOTE    William Hunter  ZOX:[id number] DOB: 08-21-1945 DOA: 03/16/2023 PCP: Irena Reichmann, DO     Brief Narrative:  77 year old with history of left lower extremity DVT on Eliquis, CAD on Plavix, DM2, HTN, GERD, RA, CHF with preserved EF presented from SNF due to complaints of chest pain.  Patient was given nitro without any improvement.  Upon admission EKG did not show any changes, tropes were negative.  Hemoglobin 8.2 with baseline of 12.  FOBT positive.  Currently Plavix and Eliquis is on hold, [facility] planning on EGD/colonoscopy.  PPI twice daily.   Assessment & Plan:  Principal Problem:   Symptomatic anemia    Anemia of chronic disease Appears to have reproducible chest pain upon palpation.  Echo in 09/2022 showed EF of 65% without wall motion abnormality.  Hemoglobin is overall stable but due to anticoagulation/antiplatelet [facility] planning on EGD/colonoscopy.  Continue PPI twice daily for now.   Atypical Chest pain, rule out ACS Sharp and reproducible on palpation History of CAD Now chest pain-free.  EKG nonischemic, troponins are negative. Plavix on hold, resume once cleared by GI   History of left lower extremity DVT (11/27/2021) on Eliquis Known DVT diagnosed back in April 2024.  Patient has been on Eliquis.  Will resume once cleared by GI.   GERD with concern for esophagitis seen on CT scan done on 09/29/2022 Continue IV PPI twice daily   Type 2 diabetes with hyperglycemia Last hemoglobin A1c 6.7 on 09/29/2022 Insulin sliding scale every 4 hours while NPO.   Rheumatoid arthritis Hold off home regimen for now   Hypertension Home BP meds on hold.  IV as needed   Hypokalemia/hypomagnesemia - As needed repletion  DVT prophylaxis: SCDs Code Status: Full code Family Communication:   Continue hospital stay until EGD/colonoscopy      Subjective: Denies any bleeding overnight.  No other complaints   Examination:  General exam: Appears calm and  comfortable  Respiratory system: Clear to auscultation. Respiratory effort normal. Cardiovascular system: S1 & S2 heard, RRR. No JVD, murmurs, rubs, gallops or clicks. No pedal edema. Gastrointestinal system: Abdomen is nondistended, soft and nontender. No organomegaly or masses felt. Normal bowel sounds heard. Central nervous system: Alert and oriented. No focal neurological deficits. Extremities: Symmetric 5 x 5 power. Skin: No rashes, lesions or ulcers Psychiatry: Judgement and insight appear normal. Mood & affect appropriate.       Diet Orders (From admission, onward)     Start     Ordered   03/19/23 0001  Diet NPO time specified  Diet effective midnight        03/18/23 0922   03/17/23 1146  Diet clear liquid Room service appropriate? Yes; Fluid consistency: Thin  Diet effective now       Question Answer Comment  Room service appropriate? Yes   Fluid consistency: Thin      03/17/23 1145            Objective: Vitals:   03/17/23 1810 03/17/23 2033 03/18/23 0523 03/18/23 0737  BP: 128/69 (!) 141/76 (!) 102/59 (!) 143/81  Pulse: 74 82 82 82  Resp: 18  18 15   Temp: 98.2 F (36.8 C) 98.2 F (36.8 C) 98.2 F (36.8 C) 97.9 F (36.6 C)  TempSrc: Oral Oral Oral Oral  SpO2: 100% 98% 98% 98%  Weight:      Height:        Intake/Output Summary (Last 24 hours) at 03/18/2023 1133 Last data filed

## 2023-03-18 NOTE — TOC Initial Note (Signed)
Transition of Care [facility]) - Initial/Assessment Note    Patient Details  Name: William Hunter MRN: [id number] Date of Birth: 11/26/1945  [facility] CM/SW Contact:    Ronson Hagins A [place], Theresia Majors Phone Number: 03/18/2023, 2:24 PM  Clinical Narrative:                  CSW met with pt at bedside. He stated that he had been at [facility] for long term care for about 4 months. He said that CSW could speak with Aurther Loft to update family with discharge plan. CSW contacted [facility] [employer] and they confirmed pt can return once medically stable.   TOC will continue to follow.  Expected Discharge Plan: Skilled Nursing Facility Barriers to Discharge: Continued Medical Work up   Patient Goals and CMS Choice            Expected Discharge Plan and Services       Living arrangements for the past 2 months: Skilled Nursing Facility                                      Prior Living Arrangements/Services Living arrangements for the past 2 months: Skilled Nursing Facility Lives with:: Facility Resident                   Activities of Daily Living   ADL Screening (condition at time of admission) Independently performs ADLs?: No (bedbound) Does the patient have a NEW difficulty with bathing/dressing/toileting/self-feeding that is expected to last >3 days?: No Does the patient have a NEW difficulty with getting in/out of bed, walking, or climbing stairs that is expected to last >3 days?: No Does the patient have a NEW difficulty with communication that is expected to last >3 days?: No Is the patient deaf or have difficulty hearing?: No Does the patient have difficulty seeing, even when wearing glasses/contacts?: No Does the patient have difficulty concentrating, remembering, or making decisions?: No  Permission Sought/Granted                  Emotional Assessment   Attitude/Demeanor/Rapport:  (normal) Affect (typically observed): Appropriate Orientation: : Oriented to  Self, Oriented to Place, Oriented to Situation, Oriented to  Time Alcohol / Substance Use: Not Applicable Psych Involvement: No (comment)  Admission diagnosis:  Melena [K92.1] Symptomatic anemia [D64.9] Gastrointestinal hemorrhage, unspecified gastrointestinal hemorrhage type [K92.2] Chest pain in adult [R07.9] Patient Active Problem List   Diagnosis Date Noted   Melena 03/17/2023   Symptomatic anemia 03/16/2023   Elevated troponin 09/30/2022   Chest pain 09/29/2022   High risk medication use 09/26/2022   Neuropathy 04/10/2022   Abnormal finding on MRI of brain 04/10/2022   Gait abnormality 01/31/2022   Aortic atherosclerosis ([name] 12/09/2021   History of pulmonary embolism 12/09/2021   Pure hypercholesterolemia 12/09/2021   Bilateral leg weakness 11/25/2021   Leg weakness, bilateral 11/25/2021   Lower extremity weakness 11/25/2021   Statin myopathy 02/22/2020   Acute respiratory failure with hypoxia ([name] 07/03/2019   Acute respiratory failure due to COVID-19 ([name] 07/03/2019   Migraine with aura and without status migrainosus, not intractable 09/02/2016   Diabetic polyneuropathy associated with type 2 diabetes mellitus ([name] 09/02/2016   Rheumatoid arthritis ([name] 09/02/2016   Coronary vasospasm ([name] 04/06/2014   Gallstones 03/10/2012   Abdominal pain 03/09/2012   NSTEMI (non-ST elevation myocardial infarction) [facility])    Coronary artery  disease    Hypertension    Dyslipidemia    PCP:  Irena Reichmann, DO Pharmacy:   [facility] [address] [place] - [address] [place] [address] Phone: [phone] Fax: [phone]  [facility] [employer] [facility] - [place], [place] - [address] [place] [address] Phone: [phone] Fax: [phone]     Social Determinants of Health (SDOH) Social History: SDOH Screenings   Food Insecurity: No Food Insecurity (03/17/2023)   Housing: Low Risk  (03/17/2023)  Transportation Needs: No Transportation Needs (03/17/2023)  Utilities: Not At Risk (03/17/2023)  Tobacco Use: Low Risk  (03/16/2023)   SDOH Interventions:     Readmission Risk Interventions     No data to display

## 2023-03-19 ENCOUNTER — Encounter (HOSPITAL_COMMUNITY): Payer: Self-pay | Admitting: Internal Medicine

## 2023-03-19 ENCOUNTER — Observation Stay (HOSPITAL_COMMUNITY): Payer: Medicare HMO | Admitting: Certified Registered"

## 2023-03-19 ENCOUNTER — Observation Stay (HOSPITAL_COMMUNITY): Payer: Self-pay | Admitting: Certified Registered"

## 2023-03-19 ENCOUNTER — Encounter (HOSPITAL_COMMUNITY)
Admission: EM | Disposition: A | Discharge status: Skilled Nursing Facility | Payer: Self-pay | Source: Home / Self Care | Attending: Emergency Medicine

## 2023-03-19 DIAGNOSIS — D649 Anemia, unspecified: Secondary | ICD-10-CM | POA: Diagnosis not present

## 2023-03-19 DIAGNOSIS — K3189 Other diseases of stomach and duodenum: Secondary | ICD-10-CM

## 2023-03-19 DIAGNOSIS — E119 Type 2 diabetes mellitus without complications: Secondary | ICD-10-CM

## 2023-03-19 DIAGNOSIS — K449 Diaphragmatic hernia without obstruction or gangrene: Secondary | ICD-10-CM | POA: Diagnosis not present

## 2023-03-19 DIAGNOSIS — R195 Other fecal abnormalities: Secondary | ICD-10-CM | POA: Diagnosis not present

## 2023-03-19 DIAGNOSIS — D509 Iron deficiency anemia, unspecified: Secondary | ICD-10-CM | POA: Diagnosis not present

## 2023-03-19 HISTORY — PX: ESOPHAGOGASTRODUODENOSCOPY (EGD) WITH PROPOFOL: SHX5813

## 2023-03-19 LAB — GLUCOSE, CAPILLARY
Glucose-Capillary: 100 mg/dL — ABNORMAL HIGH (ref 70–99)
Glucose-Capillary: 107 mg/dL — ABNORMAL HIGH (ref 70–99)
Glucose-Capillary: 115 mg/dL — ABNORMAL HIGH (ref 70–99)
Glucose-Capillary: 161 mg/dL — ABNORMAL HIGH (ref 70–99)
Glucose-Capillary: 92 mg/dL (ref 70–99)
Glucose-Capillary: 96 mg/dL (ref 70–99)

## 2023-03-19 LAB — CBC
HCT: 36.9 % — ABNORMAL LOW (ref 39.0–52.0)
Hemoglobin: 12.3 g/dL — ABNORMAL LOW (ref 13.0–17.0)
MCH: 29.8 pg (ref 26.0–34.0)
MCHC: 33.3 g/dL (ref 30.0–36.0)
MCV: 89.3 fL (ref 80.0–100.0)
Platelets: 325 10*3/uL (ref 150–400)
RBC: 4.13 MIL/uL — ABNORMAL LOW (ref 4.22–5.81)
RDW: 14.1 % (ref 11.5–15.5)
WBC: 8.4 10*3/uL (ref 4.0–10.5)
nRBC: 0 % (ref 0.0–0.2)

## 2023-03-19 LAB — MAGNESIUM: Magnesium: 1.9 mg/dL (ref 1.7–2.4)

## 2023-03-19 SURGERY — ESOPHAGOGASTRODUODENOSCOPY (EGD) WITH PROPOFOL
Anesthesia: Monitor Anesthesia Care

## 2023-03-19 MED ORDER — FENTANYL CITRATE (PF) 100 MCG/2ML IJ SOLN: 25.0000 ug | INTRAMUSCULAR | Status: DC | PRN

## 2023-03-19 MED ORDER — ONDANSETRON HCL 4 MG/2ML IJ SOLN: 4.0000 mg | Freq: Once | INTRAMUSCULAR | Status: DC | PRN

## 2023-03-19 MED ORDER — OXYCODONE HCL 5 MG PO TABS: 5.0000 mg | ORAL_TABLET | Freq: Once | ORAL | Status: DC | PRN

## 2023-03-19 MED ORDER — PROPOFOL 10 MG/ML IV BOLUS
INTRAVENOUS | Status: DC | PRN
  Administered 2023-03-19: 70 mg via INTRAVENOUS

## 2023-03-19 MED ORDER — PEG 3350-KCL-NA BICARB-NACL 420 G PO SOLR
4000.0000 mL | Freq: Once | ORAL | Status: AC
  Administered 2023-03-19: 4000 mL via ORAL
  Filled 2023-03-19: qty 4000

## 2023-03-19 MED ORDER — LIDOCAINE 2% (20 MG/ML) 5 ML SYRINGE
INTRAMUSCULAR | Status: DC | PRN
  Administered 2023-03-19: 100 mg via INTRAVENOUS

## 2023-03-19 MED ORDER — ACETAMINOPHEN 10 MG/ML IV SOLN: 1000.0000 mg | Freq: Once | INTRAVENOUS | Status: DC | PRN

## 2023-03-19 MED ORDER — PHENYLEPHRINE HCL (PRESSORS) 10 MG/ML IV SOLN
INTRAVENOUS | Status: DC | PRN
Start: 2023-03-19 — End: 2023-03-19
  Administered 2023-03-19: 160 ug via INTRAVENOUS

## 2023-03-19 MED ORDER — SODIUM CHLORIDE 0.9 % IV SOLN: INTRAVENOUS | Status: DC

## 2023-03-19 MED ORDER — PROPOFOL 500 MG/50ML IV EMUL
INTRAVENOUS | Status: DC | PRN
  Administered 2023-03-19: 100 ug/kg/min via INTRAVENOUS

## 2023-03-19 MED ORDER — OXYCODONE HCL 5 MG/5ML PO SOLN: 5.0000 mg | Freq: Once | ORAL | Status: DC | PRN

## 2023-03-19 SURGICAL SUPPLY — 25 items
BLOCK BITE 60FR ADLT L/F BLUE (MISCELLANEOUS) ×2 IMPLANT
ELECT REM PT RETURN 9FT ADLT (ELECTROSURGICAL)
ELECTRODE REM PT RTRN 9FT ADLT (ELECTROSURGICAL) IMPLANT
FCP BXJMBJMB 240X2.8X (CUTTING FORCEPS)
FLOOR PAD 36X40 (MISCELLANEOUS) ×1
FORCEP RJ3 GP 1.8X160 W-NEEDLE (CUTTING FORCEPS) IMPLANT
FORCEPS BIOP RAD 4 LRG CAP 4 (CUTTING FORCEPS) IMPLANT
FORCEPS BIOP RJ4 240 W/NDL (CUTTING FORCEPS)
FORCEPS BXJMBJMB 240X2.8X (CUTTING FORCEPS) IMPLANT
INJECTOR/SNARE I SNARE (MISCELLANEOUS) IMPLANT
LUBRICANT JELLY 4.5OZ STERILE (MISCELLANEOUS) IMPLANT
MANIFOLD NEPTUNE II (INSTRUMENTS) IMPLANT
NDL SCLEROTHERAPY 25GX240 (NEEDLE) IMPLANT
NEEDLE SCLEROTHERAPY 25GX240 (NEEDLE) IMPLANT
PAD FLOOR 36X40 (MISCELLANEOUS) ×2 IMPLANT
PROBE APC STR FIRE (PROBE) IMPLANT
PROBE INJECTION GOLD (MISCELLANEOUS)
PROBE INJECTION GOLD 7FR (MISCELLANEOUS) IMPLANT
SNARE ROTATE MED OVAL 20MM (MISCELLANEOUS) IMPLANT
SNARE SHORT THROW 13M SML OVAL (MISCELLANEOUS) IMPLANT
SYR 50ML LL SCALE MARK (SYRINGE) IMPLANT
TRAP SPECIMEN MUCOUS 40CC (MISCELLANEOUS) IMPLANT
TUBING ENDO SMARTCAP PENTAX (MISCELLANEOUS) ×4 IMPLANT
TUBING IRRIGATION ENDOGATOR (MISCELLANEOUS) ×2 IMPLANT
WATER STERILE IRR 1000ML POUR (IV SOLUTION) IMPLANT

## 2023-03-19 NOTE — Op Note (Signed)
[facility] Patient Name: William Hunter Procedure Date : 03/19/2023 MRN: [id number] Attending MD: Kerin Salen , MD, [id number] Date of Birth: April 05, 1946 CSN: [id number] Age: 77 Admit Type: Inpatient Procedure:                Upper GI endoscopy Indications:              Unexplained iron deficiency anemia Providers:                Kerin Salen, MD, Glory Rosebush, RN, Rozetta Nunnery,                            Technician Referring MD:             [facility] Medicines:                Monitored Anesthesia Care Complications:            No immediate complications. Estimated Blood Loss:     Estimated blood loss: none. Procedure:                Pre-Anesthesia Assessment:                           - Prior to the procedure, a History and Physical                            was performed, and patient medications and                            allergies were reviewed. The patient's tolerance of                            previous anesthesia was also reviewed. The risks                            and benefits of the procedure and the sedation                            options and risks were discussed with the patient.                            All questions were answered, and informed consent                            was obtained. Prior Anticoagulants: The patient has                            taken Plavix (clopidogrel), last dose was 3 days                            prior to procedure. ASA Grade Assessment: III - A                            patient with severe systemic disease. After  reviewing the risks and benefits, the patient was                            deemed in satisfactory condition to undergo the                            procedure.                           - Prior to the procedure, a History and Physical                            was performed, and patient medications and                            allergies were reviewed. The  patient's tolerance of                            previous anesthesia was also reviewed. The risks                            and benefits of the procedure and the sedation                            options and risks were discussed with the patient.                            All questions were answered, and informed consent                            was obtained. Prior Anticoagulants: The patient has                            taken Eliquis (apixaban), last dose was 3 days                            prior to procedure. ASA Grade Assessment: III - A                            patient with severe systemic disease. After                            reviewing the risks and benefits, the patient was                            deemed in satisfactory condition to undergo the                            procedure.                           After obtaining informed consent, the endoscope was  passed under direct vision. Throughout the                            procedure, the patient's blood pressure, pulse, and                            oxygen saturations were monitored continuously. The                            [id number]-[id number] ([id number]) Olympus endoscope was introduced                            through the mouth, and advanced to the second part                            of duodenum. The upper GI endoscopy was                            accomplished without difficulty. The patient                            tolerated the procedure well. Scope In: Scope Out: Findings:      The upper third of the esophagus, middle third of the esophagus and       lower third of the esophagus were normal.      The Z-line was regular and was found 35 cm from the incisors.      A 3 cm hiatal hernia was present.      Diffuse severely erythematous mucosa with easy friability and mucosal       bleeding was found in the entire examined stomach.      The examined duodenum was normal. Impression:                - Normal upper third of esophagus, middle third of                            esophagus and lower third of esophagus.                           - Z-line regular, 35 cm from the incisors.                           - 3 cm hiatal hernia.                           - Erythematous mucosa in the stomach.                           - Normal examined duodenum.                           - No specimens collected. Moderate Sedation:      Patient did not receive moderate sedation for this procedure, but       instead received monitored anesthesia care. Recommendation:           - Initail attempt at colonoscopy  was aborted as                            patient was noted to have solid stools when he was                            positioned for colonoscopy.                           - Will give additional colonic prep today and plan                            colonoscopy in am.                           - Recommend PPI BID indefinitely as patient needs                            Plavix and Eliquis to be resumed soon. Procedure Code(s):        --- Professional ---                           [phone], Esophagogastroduodenoscopy, flexible,                            transoral; diagnostic, including collection of                            specimen(s) by brushing or washing, when performed                            (separate procedure) Diagnosis Code(s):        --- Professional ---                           K44.9, Diaphragmatic hernia without obstruction or                            gangrene                           K31.89, Other diseases of stomach and duodenum                           D50.9, Iron deficiency anemia, unspecified CPT copyright 2022 [employer]. All rights reserved. The codes documented in this report are preliminary and upon coder review may  be revised to meet current compliance requirements. Kerin Salen, MD 03/19/2023 10:17:55 AM This report has been signed  electronically. Number of Addenda: 0

## 2023-03-19 NOTE — Interval H&P Note (Signed)
History and Physical Interval Note: 77/male with symptomatic anemia for EGD and colonoscopy with propofol.  03/19/2023 9:23 AM  William Hunter  has presented today for EGD and colonoscopy with propofol with the diagnosis of anemia, occult blood in stool, was on plavix and eliquis(last dose 03/16/23).  The various methods of treatment have been discussed with the patient and family. After consideration of risks, benefits and other options for treatment, the patient has consented to  Procedure(s): ESOPHAGOGASTRODUODENOSCOPY (EGD) WITH PROPOFOL (N/A) COLONOSCOPY WITH PROPOFOL (N/A) as a surgical intervention.  The patient's history has been reviewed, patient examined, no change in status, stable for surgery.  I have reviewed the patient's chart and labs.  Questions were answered to the patient's satisfaction.     Kerin Salen

## 2023-03-19 NOTE — Transfer of Care (Signed)
Immediate Anesthesia Transfer of Care Note  Patient: William Hunter  Procedure(s) Performed: ESOPHAGOGASTRODUODENOSCOPY (EGD) WITH PROPOFOL  Patient Location: PACU  Anesthesia Type:MAC  Level of Consciousness: sedated  Airway & Oxygen Therapy: Patient Spontanous Breathing  Post-op Assessment: Report given to RN and Post -op Vital signs reviewed and stable  Post vital signs: Reviewed and stable  Last Vitals:  Vitals Value Taken Time  BP 105/60 03/19/23 1000  Temp    Pulse 76 03/19/23 1002  Resp 19 03/19/23 1002  SpO2 96 % 03/19/23 1002  Vitals shown include unfiled device data.  Last Pain:  Vitals:   03/19/23 0851  TempSrc: Temporal  PainSc: 0-No pain      Patients Stated Pain Goal: 4 (03/18/23 1000)  Complications: No notable events documented.

## 2023-03-19 NOTE — Anesthesia Postprocedure Evaluation (Signed)
Anesthesia Post Note  Patient: William Hunter  Procedure(s) Performed: ESOPHAGOGASTRODUODENOSCOPY (EGD) WITH PROPOFOL     Patient location during evaluation: PACU Anesthesia Type: MAC Level of consciousness: awake and alert Pain management: pain level controlled Vital Signs Assessment: post-procedure vital signs reviewed and stable Respiratory status: spontaneous breathing, nonlabored ventilation, respiratory function stable and patient connected to nasal cannula oxygen Cardiovascular status: stable and blood pressure returned to baseline Postop Assessment: no apparent nausea or vomiting Anesthetic complications: no   No notable events documented.  Last Vitals:  Vitals:   03/19/23 1015 03/19/23 1030  BP: 126/66 126/72  Pulse: 72 77  Resp: 15 16  Temp:    SpO2: 97% 98%    Last Pain:  Vitals:   03/19/23 0851  TempSrc: Temporal  PainSc: 0-No pain                 Mariann Barter

## 2023-03-19 NOTE — Plan of Care (Signed)

## 2023-03-19 NOTE — Anesthesia Preprocedure Evaluation (Signed)
Anesthesia Evaluation  Patient identified by MRN, date of birth, ID band Patient awake    Reviewed: Allergy & Precautions, NPO status , Patient's Chart, lab work & pertinent test results, reviewed documented beta blocker date and time   History of Anesthesia Complications Negative for: history of anesthetic complications  Airway Mallampati: II  TM Distance: >3 FB     Dental  (+) Edentulous Upper   Pulmonary neg shortness of breath, pneumonia, neg COPD, neg PE   breath sounds clear to auscultation       Cardiovascular hypertension, + angina  + CAD (on plavix), + Past MI and + Cardiac Stents  (-) CABG, (-) Peripheral Vascular Disease, (-) PND and (-) DOE (-) dysrhythmias (-) pacemaker Rhythm:Regular Rate:Normal     Neuro/Psych  Headaches, neg Seizures  Neuromuscular disease    GI/Hepatic ,neg GERD  ,,(+) neg Cirrhosis        Endo/Other  diabetes, Poorly Controlled    Renal/GU Renal disease     Musculoskeletal  (+) Arthritis ,    Abdominal   Peds  Hematology  (+) Blood dyscrasia, anemia   Anesthesia Other Findings   Reproductive/Obstetrics                              Anesthesia Physical Anesthesia Plan  ASA: 3  Anesthesia Plan: MAC   Post-op Pain Management:    Induction: Intravenous  PONV Risk Score and Plan: 1 and Ondansetron and Propofol infusion  Airway Management Planned:   Additional Equipment:   Intra-op Plan:   Post-operative Plan:   Informed Consent: I have reviewed the patients History and Physical, chart, labs and discussed the procedure including the risks, benefits and alternatives for the proposed anesthesia with the patient or authorized representative who has indicated his/her understanding and acceptance.     Dental advisory given  Plan Discussed with:   Anesthesia Plan Comments:          Anesthesia Quick Evaluation

## 2023-03-19 NOTE — Progress Notes (Signed)
than 77 years old. Performed at [facility], [address] [place], [place] [address]          Radiology Studies: VAS [place] LOWER EXTREMITY VENOUS (DVT)  Result Date: 03/17/2023  Lower Venous DVT Study Patient Name:  William Hunter  Date of Exam:   03/17/2023 Medical Rec #: [id number]      Accession #:    [id number] Date of Birth: 05-06-1946      Patient Gender: M Patient Age:   77 years Exam Location:  [facility] Procedure:      VAS [place] LOWER EXTREMITY VENOUS (DVT) Referring Phys: Enid Derry HALL --------------------------------------------------------------------------------  Indications: Chest pain, systemic anemia.  Risk Factors: PE 11/26/21 DVT 11/27/21. Anticoagulation: Eliquis. Limitations: Body habitus Comparison Study: Prior bilateral LEV done 11/27/21 indicating acute DVT in the                   left SFJ, common femoral, femoral, popliteal, and peroneal                   veins Performing Technologist: Sherren Kerns RVS  Examination Guidelines: A complete evaluation includes B-mode imaging, spectral Doppler, color Doppler, and power Doppler as needed of all accessible portions of each vessel. Bilateral testing is considered an integral part of a complete examination. Limited examinations for reoccurring indications may be performed as noted. The reflux portion of the exam is performed with the patient in reverse Trendelenburg.  +---------+---------------+---------+-----------+----------+---------------+ RIGHT    CompressibilityPhasicitySpontaneityPropertiesThrombus Aging   +---------+---------------+---------+-----------+----------+---------------+ CFV      Full           Yes      Yes                                  +---------+---------------+---------+-----------+----------+---------------+ SFJ      Full                                                         +---------+---------------+---------+-----------+----------+---------------+ FV Prox  Full                                                         +---------+---------------+---------+-----------+----------+---------------+ FV Mid   Full                                                         +---------+---------------+---------+-----------+----------+---------------+ FV DistalFull                                                         +---------+---------------+---------+-----------+----------+---------------+ PFV      Full                                                         +---------+---------------+---------+-----------+----------+---------------+  PROGRESS NOTE    William Hunter  ZOX:[id number] DOB: 1945/10/06 DOA: 03/16/2023 PCP: Irena Reichmann, DO     Brief Narrative:  77 year old with history of left lower extremity DVT on Eliquis, CAD on Plavix, DM2, HTN, GERD, RA, CHF with preserved EF presented from SNF due to complaints of chest pain.  Patient was given nitro without any improvement.  Upon admission EKG did not show any changes, tropes were negative.  Hemoglobin 8.2 with baseline of 12.  FOBT positive.  Currently Plavix and Eliquis is on hold, [facility] planning on EGD/colonoscopy.  PPI twice daily.   Assessment & Plan:  Principal Problem:   Symptomatic anemia    Anemia of chronic disease secondary to gastritis Hemoglobin overall stable but due to melena endoscopy performed which showed gastritis therefore recommending PPI twice daily while on Plavix and Eliquis.  Due to poor bowel prep, colonoscopy deferred until tomorrow.   Atypical Chest pain, rule out ACS Sharp and reproducible on palpation History of CAD Now chest pain-free.  EKG nonischemic, troponins are negative. Plavix on hold   History of left lower extremity DVT (11/27/2021) on Eliquis Known DVT diagnosed back in April 2024.  Eliquis is on hold   GERD with concern for esophagitis seen on CT scan done on 09/29/2022 Continue IV PPI twice daily   Type 2 diabetes with hyperglycemia Last hemoglobin A1c 6.7 on 09/29/2022 Insulin sliding scale every 4 hours while NPO.   Rheumatoid arthritis Hold off home regimen for now   Hypertension Home BP meds on hold.  IV as needed   Hypokalemia/hypomagnesemia - As needed repletion  DVT prophylaxis: SCDs Code Status: Full code Family Communication:   Planned colonoscopy tomorrow             Subjective: Underwent EGD this morning showing gastritis.  No other complaints.  Poor bowel prep.   Examination:  General exam: Appears calm and comfortable  Respiratory system: Clear to auscultation. Respiratory  effort normal. Cardiovascular system: S1 & S2 heard, RRR. No JVD, murmurs, rubs, gallops or clicks. No pedal edema. Gastrointestinal system: Abdomen is nondistended, soft and nontender. No organomegaly or masses felt. Normal bowel sounds heard. Central nervous system: Alert and oriented. No focal neurological deficits. Extremities: Symmetric 5 x 5 power. Skin: No rashes, lesions or ulcers Psychiatry: Judgement and insight appear normal. Mood & affect appropriate.       Diet Orders (From admission, onward)     Start     Ordered   03/20/23 0001  Diet NPO time specified  Diet effective midnight        03/19/23 1000   03/19/23 1001  Diet clear liquid Room service appropriate? Yes; Fluid consistency: Thin  Diet effective now       Question Answer Comment  Room service appropriate? Yes   Fluid consistency: Thin      03/19/23 1000            Objective: Vitals:   03/19/23 0851 03/19/23 1000 03/19/23 1015 03/19/23 1030  BP: 119/66 105/60 126/66 126/72  Pulse: 81 75 72 77  Resp: 14 19 15 16   Temp: 97.8 F (36.6 C) 97.7 F (36.5 C)    TempSrc: Temporal     SpO2: 97% 96% 97% 98%  Weight:      Height:        Intake/Output Summary (Last 24 hours) at 03/19/2023 1059 Last data filed at 03/19/2023 0954 Gross per 24 hour  Intake 480 ml  Output --  Net 480  PROGRESS NOTE    William Hunter  ZOX:[id number] DOB: 1945/10/06 DOA: 03/16/2023 PCP: Irena Reichmann, DO     Brief Narrative:  77 year old with history of left lower extremity DVT on Eliquis, CAD on Plavix, DM2, HTN, GERD, RA, CHF with preserved EF presented from SNF due to complaints of chest pain.  Patient was given nitro without any improvement.  Upon admission EKG did not show any changes, tropes were negative.  Hemoglobin 8.2 with baseline of 12.  FOBT positive.  Currently Plavix and Eliquis is on hold, [facility] planning on EGD/colonoscopy.  PPI twice daily.   Assessment & Plan:  Principal Problem:   Symptomatic anemia    Anemia of chronic disease secondary to gastritis Hemoglobin overall stable but due to melena endoscopy performed which showed gastritis therefore recommending PPI twice daily while on Plavix and Eliquis.  Due to poor bowel prep, colonoscopy deferred until tomorrow.   Atypical Chest pain, rule out ACS Sharp and reproducible on palpation History of CAD Now chest pain-free.  EKG nonischemic, troponins are negative. Plavix on hold   History of left lower extremity DVT (11/27/2021) on Eliquis Known DVT diagnosed back in April 2024.  Eliquis is on hold   GERD with concern for esophagitis seen on CT scan done on 09/29/2022 Continue IV PPI twice daily   Type 2 diabetes with hyperglycemia Last hemoglobin A1c 6.7 on 09/29/2022 Insulin sliding scale every 4 hours while NPO.   Rheumatoid arthritis Hold off home regimen for now   Hypertension Home BP meds on hold.  IV as needed   Hypokalemia/hypomagnesemia - As needed repletion  DVT prophylaxis: SCDs Code Status: Full code Family Communication:   Planned colonoscopy tomorrow             Subjective: Underwent EGD this morning showing gastritis.  No other complaints.  Poor bowel prep.   Examination:  General exam: Appears calm and comfortable  Respiratory system: Clear to auscultation. Respiratory  effort normal. Cardiovascular system: S1 & S2 heard, RRR. No JVD, murmurs, rubs, gallops or clicks. No pedal edema. Gastrointestinal system: Abdomen is nondistended, soft and nontender. No organomegaly or masses felt. Normal bowel sounds heard. Central nervous system: Alert and oriented. No focal neurological deficits. Extremities: Symmetric 5 x 5 power. Skin: No rashes, lesions or ulcers Psychiatry: Judgement and insight appear normal. Mood & affect appropriate.       Diet Orders (From admission, onward)     Start     Ordered   03/20/23 0001  Diet NPO time specified  Diet effective midnight        03/19/23 1000   03/19/23 1001  Diet clear liquid Room service appropriate? Yes; Fluid consistency: Thin  Diet effective now       Question Answer Comment  Room service appropriate? Yes   Fluid consistency: Thin      03/19/23 1000            Objective: Vitals:   03/19/23 0851 03/19/23 1000 03/19/23 1015 03/19/23 1030  BP: 119/66 105/60 126/66 126/72  Pulse: 81 75 72 77  Resp: 14 19 15 16   Temp: 97.8 F (36.6 C) 97.7 F (36.5 C)    TempSrc: Temporal     SpO2: 97% 96% 97% 98%  Weight:      Height:        Intake/Output Summary (Last 24 hours) at 03/19/2023 1059 Last data filed at 03/19/2023 0954 Gross per 24 hour  Intake 480 ml  Output --  Net 480  than 77 years old. Performed at [facility], [address] [place], [place] [address]          Radiology Studies: VAS [place] LOWER EXTREMITY VENOUS (DVT)  Result Date: 03/17/2023  Lower Venous DVT Study Patient Name:  William Hunter  Date of Exam:   03/17/2023 Medical Rec #: [id number]      Accession #:    [id number] Date of Birth: 05-06-1946      Patient Gender: M Patient Age:   77 years Exam Location:  [facility] Procedure:      VAS [place] LOWER EXTREMITY VENOUS (DVT) Referring Phys: Enid Derry HALL --------------------------------------------------------------------------------  Indications: Chest pain, systemic anemia.  Risk Factors: PE 11/26/21 DVT 11/27/21. Anticoagulation: Eliquis. Limitations: Body habitus Comparison Study: Prior bilateral LEV done 11/27/21 indicating acute DVT in the                   left SFJ, common femoral, femoral, popliteal, and peroneal                   veins Performing Technologist: Sherren Kerns RVS  Examination Guidelines: A complete evaluation includes B-mode imaging, spectral Doppler, color Doppler, and power Doppler as needed of all accessible portions of each vessel. Bilateral testing is considered an integral part of a complete examination. Limited examinations for reoccurring indications may be performed as noted. The reflux portion of the exam is performed with the patient in reverse Trendelenburg.  +---------+---------------+---------+-----------+----------+---------------+ RIGHT    CompressibilityPhasicitySpontaneityPropertiesThrombus Aging   +---------+---------------+---------+-----------+----------+---------------+ CFV      Full           Yes      Yes                                  +---------+---------------+---------+-----------+----------+---------------+ SFJ      Full                                                         +---------+---------------+---------+-----------+----------+---------------+ FV Prox  Full                                                         +---------+---------------+---------+-----------+----------+---------------+ FV Mid   Full                                                         +---------+---------------+---------+-----------+----------+---------------+ FV DistalFull                                                         +---------+---------------+---------+-----------+----------+---------------+ PFV      Full                                                         +---------+---------------+---------+-----------+----------+---------------+  than 77 years old. Performed at [facility], [address] [place], [place] [address]          Radiology Studies: VAS [place] LOWER EXTREMITY VENOUS (DVT)  Result Date: 03/17/2023  Lower Venous DVT Study Patient Name:  William Hunter  Date of Exam:   03/17/2023 Medical Rec #: [id number]      Accession #:    [id number] Date of Birth: 05-06-1946      Patient Gender: M Patient Age:   77 years Exam Location:  [facility] Procedure:      VAS [place] LOWER EXTREMITY VENOUS (DVT) Referring Phys: Enid Derry HALL --------------------------------------------------------------------------------  Indications: Chest pain, systemic anemia.  Risk Factors: PE 11/26/21 DVT 11/27/21. Anticoagulation: Eliquis. Limitations: Body habitus Comparison Study: Prior bilateral LEV done 11/27/21 indicating acute DVT in the                   left SFJ, common femoral, femoral, popliteal, and peroneal                   veins Performing Technologist: Sherren Kerns RVS  Examination Guidelines: A complete evaluation includes B-mode imaging, spectral Doppler, color Doppler, and power Doppler as needed of all accessible portions of each vessel. Bilateral testing is considered an integral part of a complete examination. Limited examinations for reoccurring indications may be performed as noted. The reflux portion of the exam is performed with the patient in reverse Trendelenburg.  +---------+---------------+---------+-----------+----------+---------------+ RIGHT    CompressibilityPhasicitySpontaneityPropertiesThrombus Aging   +---------+---------------+---------+-----------+----------+---------------+ CFV      Full           Yes      Yes                                  +---------+---------------+---------+-----------+----------+---------------+ SFJ      Full                                                         +---------+---------------+---------+-----------+----------+---------------+ FV Prox  Full                                                         +---------+---------------+---------+-----------+----------+---------------+ FV Mid   Full                                                         +---------+---------------+---------+-----------+----------+---------------+ FV DistalFull                                                         +---------+---------------+---------+-----------+----------+---------------+ PFV      Full                                                         +---------+---------------+---------+-----------+----------+---------------+

## 2023-03-20 DIAGNOSIS — Z7401 Bed confinement status: Secondary | ICD-10-CM | POA: Diagnosis not present

## 2023-03-20 DIAGNOSIS — R531 Weakness: Secondary | ICD-10-CM | POA: Diagnosis not present

## 2023-03-20 DIAGNOSIS — D649 Anemia, unspecified: Secondary | ICD-10-CM | POA: Diagnosis not present

## 2023-03-20 LAB — BASIC METABOLIC PANEL
Anion gap: 11 (ref 5–15)
BUN: 5 mg/dL — ABNORMAL LOW (ref 8–23)
CO2: 24 mmol/L (ref 22–32)
Calcium: 9.3 mg/dL (ref 8.9–10.3)
Chloride: 105 mmol/L (ref 98–111)
Creatinine, Ser: 0.69 mg/dL (ref 0.61–1.24)
GFR, Estimated: >60 mL/min (ref >60)
Glucose, Bld: 111 mg/dL — ABNORMAL HIGH (ref 70–99)
Potassium: 3.8 mmol/L (ref 3.5–5.1)
Sodium: 140 mmol/L (ref 135–145)

## 2023-03-20 LAB — CBC
HCT: 37.9 % — ABNORMAL LOW (ref 39.0–52.0)
Hemoglobin: 12.7 g/dL — ABNORMAL LOW (ref 13.0–17.0)
MCH: 31.1 pg (ref 26.0–34.0)
MCHC: 33.5 g/dL (ref 30.0–36.0)
MCV: 92.7 fL (ref 80.0–100.0)
Platelets: 324 10*3/uL (ref 150–400)
RBC: 4.09 MIL/uL — ABNORMAL LOW (ref 4.22–5.81)
RDW: 14.1 % (ref 11.5–15.5)
WBC: 8.6 10*3/uL (ref 4.0–10.5)
nRBC: 0 % (ref 0.0–0.2)

## 2023-03-20 LAB — MAGNESIUM: Magnesium: 2 mg/dL (ref 1.7–2.4)

## 2023-03-20 LAB — GLUCOSE, CAPILLARY
Glucose-Capillary: 115 mg/dL — ABNORMAL HIGH (ref 70–99)
Glucose-Capillary: 115 mg/dL — ABNORMAL HIGH (ref 70–99)
Glucose-Capillary: 130 mg/dL — ABNORMAL HIGH (ref 70–99)

## 2023-03-20 SURGERY — COLONOSCOPY WITH PROPOFOL
Anesthesia: Monitor Anesthesia Care

## 2023-03-20 MED ORDER — SODIUM CHLORIDE 0.9 % IV SOLN: INTRAVENOUS | Status: DC

## 2023-03-20 MED ORDER — PANTOPRAZOLE SODIUM 40 MG PO TBEC: 40.0000 mg | DELAYED_RELEASE_TABLET | Freq: Two times a day (BID) | ORAL | 0 refills | Status: AC

## 2023-03-20 NOTE — Discharge Summary (Signed)
Physician Discharge Summary  SEVERIANO UTSEY GUY:[id number] DOB: Feb 23, 1946 DOA: 03/16/2023  PCP: Irena Reichmann, DO  Admit date: 03/16/2023 Discharge date: 03/20/2023  Admitted From: Home Disposition: Home  Recommendations for Outpatient Follow-up:  Follow up with PCP in 1-2 weeks Please obtain BMP/CBC in one week your next doctors visit.  Outpatient follow-up with [facility] gastroenterology for colonoscopy.  Their office to arrange for this While on Eliquis and Plavix, continue PPI indefinitely twice daily  Discharge Condition: Stable CODE STATUS: Full code Diet recommendation: Cardiac  Brief/Interim Summary:  Brief Narrative:  77 year old with history of left lower extremity DVT on Eliquis, CAD on Plavix, DM2, HTN, GERD, RA, CHF with preserved EF presented from SNF due to complaints of chest pain.  Patient was given nitro without any improvement.  Upon admission EKG did not show any changes, tropes were negative.  Hemoglobin 8.2 with baseline of 12.  FOBT positive.  Currently Plavix and Eliquis is on hold, [facility] planning on EGD/colonoscopy.  PPI twice daily.  EGD showed gastritis, unable to perform colonoscopy due to poor prep.  Medically stable along with hemoglobin being stable as well.  GI recommending PPI twice daily while on Plavix and Eliquis.   Assessment & Plan:  Principal Problem:   Symptomatic anemia    Anemia of chronic disease secondary to gastritis Hemoglobin overall stable but due to melena endoscopy performed which showed gastritis therefore recommending PPI twice daily while on Plavix and Eliquis.  Patient unable to adequately plan for colonoscopy therefore this will be deferred to outpatient.   Atypical Chest pain, rule out ACS Sharp and reproducible on palpation History of CAD Chest pain-free, EKG nonischemic.  Plavix resumed   History of left lower extremity DVT (11/27/2021) on Eliquis Known DVT diagnosed back in April 2024.  Eliquis twice daily   GERD with  concern for esophagitis seen on CT scan done on 09/29/2022 Continue IV PPI twice daily   Type 2 diabetes with hyperglycemia Resume home regimen   Rheumatoid arthritis Resume home regimen   Hypertension Home BP meds    Hypokalemia/hypomagnesemia - As needed repletion  DVT prophylaxis: SCDs Code Status: Full code Family Communication:   Discharge today     Discharge Diagnoses:  Principal Problem:   Symptomatic anemia Active Problems:   Melena      Consultations: [facility]  Subjective: Feels well no complaints  Discharge Exam: Vitals:   03/20/23 0500 03/20/23 0842  BP: (!) 125/91 (!) 97/58  Pulse: (!) 59 73  Resp: 16 16  Temp: 97.9 F (36.6 C) 98 F (36.7 C)  SpO2: 100% 100%   Vitals:   03/19/23 2200 03/20/23 0354 03/20/23 0500 03/20/23 0842  BP: 115/61 105/61 (!) 125/91 (!) 97/58  Pulse: 69 72 (!) 59 73  Resp: 18 19 16 16   Temp: 98.3 F (36.8 C) 98 F (36.7 C) 97.9 F (36.6 C) 98 F (36.7 C)  TempSrc: Oral Oral Axillary Oral  SpO2: 98% 98% 100% 100%  Weight:      Height:        General: Pt is alert, awake, not in acute distress Cardiovascular: RRR, S1/S2 +, no rubs, no gallops Respiratory: CTA bilaterally, no wheezing, no rhonchi Abdominal: Soft, NT, ND, bowel sounds + Extremities: no edema, no cyanosis  Discharge Instructions   Allergies as of 03/20/2023       Reactions   Codeine Swelling, Rash   Penicillins Hives, Swelling   Has patient had a PCN reaction causing immediate rash, facial/tongue/throat swelling,  Physician Discharge Summary  SEVERIANO UTSEY GUY:[id number] DOB: Feb 23, 1946 DOA: 03/16/2023  PCP: Irena Reichmann, DO  Admit date: 03/16/2023 Discharge date: 03/20/2023  Admitted From: Home Disposition: Home  Recommendations for Outpatient Follow-up:  Follow up with PCP in 1-2 weeks Please obtain BMP/CBC in one week your next doctors visit.  Outpatient follow-up with [facility] gastroenterology for colonoscopy.  Their office to arrange for this While on Eliquis and Plavix, continue PPI indefinitely twice daily  Discharge Condition: Stable CODE STATUS: Full code Diet recommendation: Cardiac  Brief/Interim Summary:  Brief Narrative:  77 year old with history of left lower extremity DVT on Eliquis, CAD on Plavix, DM2, HTN, GERD, RA, CHF with preserved EF presented from SNF due to complaints of chest pain.  Patient was given nitro without any improvement.  Upon admission EKG did not show any changes, tropes were negative.  Hemoglobin 8.2 with baseline of 12.  FOBT positive.  Currently Plavix and Eliquis is on hold, [facility] planning on EGD/colonoscopy.  PPI twice daily.  EGD showed gastritis, unable to perform colonoscopy due to poor prep.  Medically stable along with hemoglobin being stable as well.  GI recommending PPI twice daily while on Plavix and Eliquis.   Assessment & Plan:  Principal Problem:   Symptomatic anemia    Anemia of chronic disease secondary to gastritis Hemoglobin overall stable but due to melena endoscopy performed which showed gastritis therefore recommending PPI twice daily while on Plavix and Eliquis.  Patient unable to adequately plan for colonoscopy therefore this will be deferred to outpatient.   Atypical Chest pain, rule out ACS Sharp and reproducible on palpation History of CAD Chest pain-free, EKG nonischemic.  Plavix resumed   History of left lower extremity DVT (11/27/2021) on Eliquis Known DVT diagnosed back in April 2024.  Eliquis twice daily   GERD with  concern for esophagitis seen on CT scan done on 09/29/2022 Continue IV PPI twice daily   Type 2 diabetes with hyperglycemia Resume home regimen   Rheumatoid arthritis Resume home regimen   Hypertension Home BP meds    Hypokalemia/hypomagnesemia - As needed repletion  DVT prophylaxis: SCDs Code Status: Full code Family Communication:   Discharge today     Discharge Diagnoses:  Principal Problem:   Symptomatic anemia Active Problems:   Melena      Consultations: [facility]  Subjective: Feels well no complaints  Discharge Exam: Vitals:   03/20/23 0500 03/20/23 0842  BP: (!) 125/91 (!) 97/58  Pulse: (!) 59 73  Resp: 16 16  Temp: 97.9 F (36.6 C) 98 F (36.7 C)  SpO2: 100% 100%   Vitals:   03/19/23 2200 03/20/23 0354 03/20/23 0500 03/20/23 0842  BP: 115/61 105/61 (!) 125/91 (!) 97/58  Pulse: 69 72 (!) 59 73  Resp: 18 19 16 16   Temp: 98.3 F (36.8 C) 98 F (36.7 C) 97.9 F (36.6 C) 98 F (36.7 C)  TempSrc: Oral Oral Axillary Oral  SpO2: 98% 98% 100% 100%  Weight:      Height:        General: Pt is alert, awake, not in acute distress Cardiovascular: RRR, S1/S2 +, no rubs, no gallops Respiratory: CTA bilaterally, no wheezing, no rhonchi Abdominal: Soft, NT, ND, bowel sounds + Extremities: no edema, no cyanosis  Discharge Instructions   Allergies as of 03/20/2023       Reactions   Codeine Swelling, Rash   Penicillins Hives, Swelling   Has patient had a PCN reaction causing immediate rash, facial/tongue/throat swelling,  Physician Discharge Summary  SEVERIANO UTSEY GUY:[id number] DOB: Feb 23, 1946 DOA: 03/16/2023  PCP: Irena Reichmann, DO  Admit date: 03/16/2023 Discharge date: 03/20/2023  Admitted From: Home Disposition: Home  Recommendations for Outpatient Follow-up:  Follow up with PCP in 1-2 weeks Please obtain BMP/CBC in one week your next doctors visit.  Outpatient follow-up with [facility] gastroenterology for colonoscopy.  Their office to arrange for this While on Eliquis and Plavix, continue PPI indefinitely twice daily  Discharge Condition: Stable CODE STATUS: Full code Diet recommendation: Cardiac  Brief/Interim Summary:  Brief Narrative:  77 year old with history of left lower extremity DVT on Eliquis, CAD on Plavix, DM2, HTN, GERD, RA, CHF with preserved EF presented from SNF due to complaints of chest pain.  Patient was given nitro without any improvement.  Upon admission EKG did not show any changes, tropes were negative.  Hemoglobin 8.2 with baseline of 12.  FOBT positive.  Currently Plavix and Eliquis is on hold, [facility] planning on EGD/colonoscopy.  PPI twice daily.  EGD showed gastritis, unable to perform colonoscopy due to poor prep.  Medically stable along with hemoglobin being stable as well.  GI recommending PPI twice daily while on Plavix and Eliquis.   Assessment & Plan:  Principal Problem:   Symptomatic anemia    Anemia of chronic disease secondary to gastritis Hemoglobin overall stable but due to melena endoscopy performed which showed gastritis therefore recommending PPI twice daily while on Plavix and Eliquis.  Patient unable to adequately plan for colonoscopy therefore this will be deferred to outpatient.   Atypical Chest pain, rule out ACS Sharp and reproducible on palpation History of CAD Chest pain-free, EKG nonischemic.  Plavix resumed   History of left lower extremity DVT (11/27/2021) on Eliquis Known DVT diagnosed back in April 2024.  Eliquis twice daily   GERD with  concern for esophagitis seen on CT scan done on 09/29/2022 Continue IV PPI twice daily   Type 2 diabetes with hyperglycemia Resume home regimen   Rheumatoid arthritis Resume home regimen   Hypertension Home BP meds    Hypokalemia/hypomagnesemia - As needed repletion  DVT prophylaxis: SCDs Code Status: Full code Family Communication:   Discharge today     Discharge Diagnoses:  Principal Problem:   Symptomatic anemia Active Problems:   Melena      Consultations: [facility]  Subjective: Feels well no complaints  Discharge Exam: Vitals:   03/20/23 0500 03/20/23 0842  BP: (!) 125/91 (!) 97/58  Pulse: (!) 59 73  Resp: 16 16  Temp: 97.9 F (36.6 C) 98 F (36.7 C)  SpO2: 100% 100%   Vitals:   03/19/23 2200 03/20/23 0354 03/20/23 0500 03/20/23 0842  BP: 115/61 105/61 (!) 125/91 (!) 97/58  Pulse: 69 72 (!) 59 73  Resp: 18 19 16 16   Temp: 98.3 F (36.8 C) 98 F (36.7 C) 97.9 F (36.6 C) 98 F (36.7 C)  TempSrc: Oral Oral Axillary Oral  SpO2: 98% 98% 100% 100%  Weight:      Height:        General: Pt is alert, awake, not in acute distress Cardiovascular: RRR, S1/S2 +, no rubs, no gallops Respiratory: CTA bilaterally, no wheezing, no rhonchi Abdominal: Soft, NT, ND, bowel sounds + Extremities: no edema, no cyanosis  Discharge Instructions   Allergies as of 03/20/2023       Reactions   Codeine Swelling, Rash   Penicillins Hives, Swelling   Has patient had a PCN reaction causing immediate rash, facial/tongue/throat swelling,  issues. Please note that NO REFILLS for any discharge medications will be authorized once you are discharged, as it is imperative that you return to your primary care physician (or establish a relationship with a primary care physician if you do not have one) for your aftercare needs so that they can reassess your need for medications and monitor your lab values.  Please request your Prim.MD to go over all Hospital Tests and Procedure/Radiological results at the follow up, please get all Hospital records sent to your Prim MD by signing hospital release before you go home.  Get CBC, CMP, 2 view Chest X ray checked  by Primary MD during your next visit or SNF MD in 5-7 days ( we routinely change or add medications that can affect your baseline labs and fluid status, therefore we recommend that you get the mentioned basic workup next visit with your PCP, your PCP may decide not to get them or add new tests based on their clinical decision)  On your next visit with your primary care physician please Get Medicines reviewed and adjusted.  If you experience worsening of your admission symptoms, develop shortness of breath, life threatening emergency, suicidal or homicidal thoughts you must seek medical attention immediately by calling 911 or calling your MD immediately  if symptoms less severe.  You Must read complete instructions/literature along with all the possible adverse reactions/side effects for all the Medicines you take and that have been prescribed to you. Take any new Medicines after you have completely understood and accpet all the possible adverse reactions/side effects.   Do not drive, operate heavy machinery, perform activities at heights, swimming or participation in water activities or provide baby sitting services if your were admitted for  syncope or siezures until you have seen by Primary MD or a Neurologist and advised to do so again.  Do not drive when taking Pain medications.   Procedures/Studies: VAS [place] LOWER EXTREMITY VENOUS (DVT)  Result Date: 03/17/2023  Lower Venous DVT Study Patient Name:  William Hunter  Date of Exam:   03/17/2023 Medical Rec #: [id number]      Accession #:    [id number] Date of Birth: Nov 04, 1945      Patient Gender: M Patient Age:   33 years Exam Location:  [facility] Procedure:      VAS [place] LOWER EXTREMITY VENOUS (DVT) Referring Phys: Enid Derry HALL --------------------------------------------------------------------------------  Indications: Chest pain, systemic anemia.  Risk Factors: PE 11/26/21 DVT 11/27/21. Anticoagulation: Eliquis. Limitations: Body habitus Comparison Study: Prior bilateral LEV done 11/27/21 indicating acute DVT in the                   left SFJ, common femoral, femoral, popliteal, and peroneal                   veins Performing Technologist: Sherren Kerns RVS  Examination Guidelines: A complete evaluation includes B-mode imaging, spectral Doppler, color Doppler, and power Doppler as needed of all accessible portions of each vessel. Bilateral testing is considered an integral part of a complete examination. Limited examinations for reoccurring indications may be performed as noted. The reflux portion of the exam is performed with the patient in reverse Trendelenburg.  +---------+---------------+---------+-----------+----------+---------------+ RIGHT    CompressibilityPhasicitySpontaneityPropertiesThrombus Aging  +---------+---------------+---------+-----------+----------+---------------+ CFV      Full           Yes      Yes                                  +---------+---------------+---------+-----------+----------+---------------+  Physician Discharge Summary  SEVERIANO UTSEY GUY:[id number] DOB: Feb 23, 1946 DOA: 03/16/2023  PCP: Irena Reichmann, DO  Admit date: 03/16/2023 Discharge date: 03/20/2023  Admitted From: Home Disposition: Home  Recommendations for Outpatient Follow-up:  Follow up with PCP in 1-2 weeks Please obtain BMP/CBC in one week your next doctors visit.  Outpatient follow-up with [facility] gastroenterology for colonoscopy.  Their office to arrange for this While on Eliquis and Plavix, continue PPI indefinitely twice daily  Discharge Condition: Stable CODE STATUS: Full code Diet recommendation: Cardiac  Brief/Interim Summary:  Brief Narrative:  77 year old with history of left lower extremity DVT on Eliquis, CAD on Plavix, DM2, HTN, GERD, RA, CHF with preserved EF presented from SNF due to complaints of chest pain.  Patient was given nitro without any improvement.  Upon admission EKG did not show any changes, tropes were negative.  Hemoglobin 8.2 with baseline of 12.  FOBT positive.  Currently Plavix and Eliquis is on hold, [facility] planning on EGD/colonoscopy.  PPI twice daily.  EGD showed gastritis, unable to perform colonoscopy due to poor prep.  Medically stable along with hemoglobin being stable as well.  GI recommending PPI twice daily while on Plavix and Eliquis.   Assessment & Plan:  Principal Problem:   Symptomatic anemia    Anemia of chronic disease secondary to gastritis Hemoglobin overall stable but due to melena endoscopy performed which showed gastritis therefore recommending PPI twice daily while on Plavix and Eliquis.  Patient unable to adequately plan for colonoscopy therefore this will be deferred to outpatient.   Atypical Chest pain, rule out ACS Sharp and reproducible on palpation History of CAD Chest pain-free, EKG nonischemic.  Plavix resumed   History of left lower extremity DVT (11/27/2021) on Eliquis Known DVT diagnosed back in April 2024.  Eliquis twice daily   GERD with  concern for esophagitis seen on CT scan done on 09/29/2022 Continue IV PPI twice daily   Type 2 diabetes with hyperglycemia Resume home regimen   Rheumatoid arthritis Resume home regimen   Hypertension Home BP meds    Hypokalemia/hypomagnesemia - As needed repletion  DVT prophylaxis: SCDs Code Status: Full code Family Communication:   Discharge today     Discharge Diagnoses:  Principal Problem:   Symptomatic anemia Active Problems:   Melena      Consultations: [facility]  Subjective: Feels well no complaints  Discharge Exam: Vitals:   03/20/23 0500 03/20/23 0842  BP: (!) 125/91 (!) 97/58  Pulse: (!) 59 73  Resp: 16 16  Temp: 97.9 F (36.6 C) 98 F (36.7 C)  SpO2: 100% 100%   Vitals:   03/19/23 2200 03/20/23 0354 03/20/23 0500 03/20/23 0842  BP: 115/61 105/61 (!) 125/91 (!) 97/58  Pulse: 69 72 (!) 59 73  Resp: 18 19 16 16   Temp: 98.3 F (36.8 C) 98 F (36.7 C) 97.9 F (36.6 C) 98 F (36.7 C)  TempSrc: Oral Oral Axillary Oral  SpO2: 98% 98% 100% 100%  Weight:      Height:        General: Pt is alert, awake, not in acute distress Cardiovascular: RRR, S1/S2 +, no rubs, no gallops Respiratory: CTA bilaterally, no wheezing, no rhonchi Abdominal: Soft, NT, ND, bowel sounds + Extremities: no edema, no cyanosis  Discharge Instructions   Allergies as of 03/20/2023       Reactions   Codeine Swelling, Rash   Penicillins Hives, Swelling   Has patient had a PCN reaction causing immediate rash, facial/tongue/throat swelling,  Physician Discharge Summary  SEVERIANO UTSEY GUY:[id number] DOB: Feb 23, 1946 DOA: 03/16/2023  PCP: Irena Reichmann, DO  Admit date: 03/16/2023 Discharge date: 03/20/2023  Admitted From: Home Disposition: Home  Recommendations for Outpatient Follow-up:  Follow up with PCP in 1-2 weeks Please obtain BMP/CBC in one week your next doctors visit.  Outpatient follow-up with [facility] gastroenterology for colonoscopy.  Their office to arrange for this While on Eliquis and Plavix, continue PPI indefinitely twice daily  Discharge Condition: Stable CODE STATUS: Full code Diet recommendation: Cardiac  Brief/Interim Summary:  Brief Narrative:  77 year old with history of left lower extremity DVT on Eliquis, CAD on Plavix, DM2, HTN, GERD, RA, CHF with preserved EF presented from SNF due to complaints of chest pain.  Patient was given nitro without any improvement.  Upon admission EKG did not show any changes, tropes were negative.  Hemoglobin 8.2 with baseline of 12.  FOBT positive.  Currently Plavix and Eliquis is on hold, [facility] planning on EGD/colonoscopy.  PPI twice daily.  EGD showed gastritis, unable to perform colonoscopy due to poor prep.  Medically stable along with hemoglobin being stable as well.  GI recommending PPI twice daily while on Plavix and Eliquis.   Assessment & Plan:  Principal Problem:   Symptomatic anemia    Anemia of chronic disease secondary to gastritis Hemoglobin overall stable but due to melena endoscopy performed which showed gastritis therefore recommending PPI twice daily while on Plavix and Eliquis.  Patient unable to adequately plan for colonoscopy therefore this will be deferred to outpatient.   Atypical Chest pain, rule out ACS Sharp and reproducible on palpation History of CAD Chest pain-free, EKG nonischemic.  Plavix resumed   History of left lower extremity DVT (11/27/2021) on Eliquis Known DVT diagnosed back in April 2024.  Eliquis twice daily   GERD with  concern for esophagitis seen on CT scan done on 09/29/2022 Continue IV PPI twice daily   Type 2 diabetes with hyperglycemia Resume home regimen   Rheumatoid arthritis Resume home regimen   Hypertension Home BP meds    Hypokalemia/hypomagnesemia - As needed repletion  DVT prophylaxis: SCDs Code Status: Full code Family Communication:   Discharge today     Discharge Diagnoses:  Principal Problem:   Symptomatic anemia Active Problems:   Melena      Consultations: [facility]  Subjective: Feels well no complaints  Discharge Exam: Vitals:   03/20/23 0500 03/20/23 0842  BP: (!) 125/91 (!) 97/58  Pulse: (!) 59 73  Resp: 16 16  Temp: 97.9 F (36.6 C) 98 F (36.7 C)  SpO2: 100% 100%   Vitals:   03/19/23 2200 03/20/23 0354 03/20/23 0500 03/20/23 0842  BP: 115/61 105/61 (!) 125/91 (!) 97/58  Pulse: 69 72 (!) 59 73  Resp: 18 19 16 16   Temp: 98.3 F (36.8 C) 98 F (36.7 C) 97.9 F (36.6 C) 98 F (36.7 C)  TempSrc: Oral Oral Axillary Oral  SpO2: 98% 98% 100% 100%  Weight:      Height:        General: Pt is alert, awake, not in acute distress Cardiovascular: RRR, S1/S2 +, no rubs, no gallops Respiratory: CTA bilaterally, no wheezing, no rhonchi Abdominal: Soft, NT, ND, bowel sounds + Extremities: no edema, no cyanosis  Discharge Instructions   Allergies as of 03/20/2023       Reactions   Codeine Swelling, Rash   Penicillins Hives, Swelling   Has patient had a PCN reaction causing immediate rash, facial/tongue/throat swelling,  Physician Discharge Summary  SEVERIANO UTSEY GUY:[id number] DOB: Feb 23, 1946 DOA: 03/16/2023  PCP: Irena Reichmann, DO  Admit date: 03/16/2023 Discharge date: 03/20/2023  Admitted From: Home Disposition: Home  Recommendations for Outpatient Follow-up:  Follow up with PCP in 1-2 weeks Please obtain BMP/CBC in one week your next doctors visit.  Outpatient follow-up with [facility] gastroenterology for colonoscopy.  Their office to arrange for this While on Eliquis and Plavix, continue PPI indefinitely twice daily  Discharge Condition: Stable CODE STATUS: Full code Diet recommendation: Cardiac  Brief/Interim Summary:  Brief Narrative:  77 year old with history of left lower extremity DVT on Eliquis, CAD on Plavix, DM2, HTN, GERD, RA, CHF with preserved EF presented from SNF due to complaints of chest pain.  Patient was given nitro without any improvement.  Upon admission EKG did not show any changes, tropes were negative.  Hemoglobin 8.2 with baseline of 12.  FOBT positive.  Currently Plavix and Eliquis is on hold, [facility] planning on EGD/colonoscopy.  PPI twice daily.  EGD showed gastritis, unable to perform colonoscopy due to poor prep.  Medically stable along with hemoglobin being stable as well.  GI recommending PPI twice daily while on Plavix and Eliquis.   Assessment & Plan:  Principal Problem:   Symptomatic anemia    Anemia of chronic disease secondary to gastritis Hemoglobin overall stable but due to melena endoscopy performed which showed gastritis therefore recommending PPI twice daily while on Plavix and Eliquis.  Patient unable to adequately plan for colonoscopy therefore this will be deferred to outpatient.   Atypical Chest pain, rule out ACS Sharp and reproducible on palpation History of CAD Chest pain-free, EKG nonischemic.  Plavix resumed   History of left lower extremity DVT (11/27/2021) on Eliquis Known DVT diagnosed back in April 2024.  Eliquis twice daily   GERD with  concern for esophagitis seen on CT scan done on 09/29/2022 Continue IV PPI twice daily   Type 2 diabetes with hyperglycemia Resume home regimen   Rheumatoid arthritis Resume home regimen   Hypertension Home BP meds    Hypokalemia/hypomagnesemia - As needed repletion  DVT prophylaxis: SCDs Code Status: Full code Family Communication:   Discharge today     Discharge Diagnoses:  Principal Problem:   Symptomatic anemia Active Problems:   Melena      Consultations: [facility]  Subjective: Feels well no complaints  Discharge Exam: Vitals:   03/20/23 0500 03/20/23 0842  BP: (!) 125/91 (!) 97/58  Pulse: (!) 59 73  Resp: 16 16  Temp: 97.9 F (36.6 C) 98 F (36.7 C)  SpO2: 100% 100%   Vitals:   03/19/23 2200 03/20/23 0354 03/20/23 0500 03/20/23 0842  BP: 115/61 105/61 (!) 125/91 (!) 97/58  Pulse: 69 72 (!) 59 73  Resp: 18 19 16 16   Temp: 98.3 F (36.8 C) 98 F (36.7 C) 97.9 F (36.6 C) 98 F (36.7 C)  TempSrc: Oral Oral Axillary Oral  SpO2: 98% 98% 100% 100%  Weight:      Height:        General: Pt is alert, awake, not in acute distress Cardiovascular: RRR, S1/S2 +, no rubs, no gallops Respiratory: CTA bilaterally, no wheezing, no rhonchi Abdominal: Soft, NT, ND, bowel sounds + Extremities: no edema, no cyanosis  Discharge Instructions   Allergies as of 03/20/2023       Reactions   Codeine Swelling, Rash   Penicillins Hives, Swelling   Has patient had a PCN reaction causing immediate rash, facial/tongue/throat swelling,  issues. Please note that NO REFILLS for any discharge medications will be authorized once you are discharged, as it is imperative that you return to your primary care physician (or establish a relationship with a primary care physician if you do not have one) for your aftercare needs so that they can reassess your need for medications and monitor your lab values.  Please request your Prim.MD to go over all Hospital Tests and Procedure/Radiological results at the follow up, please get all Hospital records sent to your Prim MD by signing hospital release before you go home.  Get CBC, CMP, 2 view Chest X ray checked  by Primary MD during your next visit or SNF MD in 5-7 days ( we routinely change or add medications that can affect your baseline labs and fluid status, therefore we recommend that you get the mentioned basic workup next visit with your PCP, your PCP may decide not to get them or add new tests based on their clinical decision)  On your next visit with your primary care physician please Get Medicines reviewed and adjusted.  If you experience worsening of your admission symptoms, develop shortness of breath, life threatening emergency, suicidal or homicidal thoughts you must seek medical attention immediately by calling 911 or calling your MD immediately  if symptoms less severe.  You Must read complete instructions/literature along with all the possible adverse reactions/side effects for all the Medicines you take and that have been prescribed to you. Take any new Medicines after you have completely understood and accpet all the possible adverse reactions/side effects.   Do not drive, operate heavy machinery, perform activities at heights, swimming or participation in water activities or provide baby sitting services if your were admitted for  syncope or siezures until you have seen by Primary MD or a Neurologist and advised to do so again.  Do not drive when taking Pain medications.   Procedures/Studies: VAS [place] LOWER EXTREMITY VENOUS (DVT)  Result Date: 03/17/2023  Lower Venous DVT Study Patient Name:  William Hunter  Date of Exam:   03/17/2023 Medical Rec #: [id number]      Accession #:    [id number] Date of Birth: Nov 04, 1945      Patient Gender: M Patient Age:   33 years Exam Location:  [facility] Procedure:      VAS [place] LOWER EXTREMITY VENOUS (DVT) Referring Phys: Enid Derry HALL --------------------------------------------------------------------------------  Indications: Chest pain, systemic anemia.  Risk Factors: PE 11/26/21 DVT 11/27/21. Anticoagulation: Eliquis. Limitations: Body habitus Comparison Study: Prior bilateral LEV done 11/27/21 indicating acute DVT in the                   left SFJ, common femoral, femoral, popliteal, and peroneal                   veins Performing Technologist: Sherren Kerns RVS  Examination Guidelines: A complete evaluation includes B-mode imaging, spectral Doppler, color Doppler, and power Doppler as needed of all accessible portions of each vessel. Bilateral testing is considered an integral part of a complete examination. Limited examinations for reoccurring indications may be performed as noted. The reflux portion of the exam is performed with the patient in reverse Trendelenburg.  +---------+---------------+---------+-----------+----------+---------------+ RIGHT    CompressibilityPhasicitySpontaneityPropertiesThrombus Aging  +---------+---------------+---------+-----------+----------+---------------+ CFV      Full           Yes      Yes                                  +---------+---------------+---------+-----------+----------+---------------+

## 2023-03-20 NOTE — Plan of Care (Signed)
  Problem: Education: Goal: Understanding of cardiac disease, CV risk reduction, and recovery process will improve Outcome: Progressing Goal: Individualized Educational Video(s) Outcome: Progressing   Problem: Activity: Goal: Ability to tolerate increased activity will improve Outcome: Progressing   Problem: Cardiac: Goal: Ability to achieve and maintain adequate cardiovascular perfusion will improve Outcome: Progressing   Problem: Health Behavior/Discharge Planning: Goal: Ability to safely manage health-related needs after discharge will improve Outcome: Progressing   Problem: Education: Goal: Understanding of CV disease, CV risk reduction, and recovery process will improve Outcome: Progressing Goal: Individualized Educational Video(s) Outcome: Progressing   Problem: Activity: Goal: Ability to return to baseline activity level will improve Outcome: Progressing   Problem: Cardiovascular: Goal: Ability to achieve and maintain adequate cardiovascular perfusion will improve Outcome: Progressing Goal: Vascular access site(s) Level 0-1 will be maintained Outcome: Progressing   Problem: Health Behavior/Discharge Planning: Goal: Ability to safely manage health-related needs after discharge will improve Outcome: Progressing   Problem: Education: Goal: Ability to describe self-care measures that may prevent or decrease complications (Diabetes Survival Skills Education) will improve Outcome: Progressing Goal: Individualized Educational Video(s) Outcome: Progressing   Problem: Coping: Goal: Ability to adjust to condition or change in health will improve Outcome: Progressing   Problem: Fluid Volume: Goal: Ability to maintain a balanced intake and output will improve Outcome: Progressing   Problem: Health Behavior/Discharge Planning: Goal: Ability to identify and utilize available resources and services will improve Outcome: Progressing Goal: Ability to manage health-related  needs will improve Outcome: Progressing   Problem: Metabolic: Goal: Ability to maintain appropriate glucose levels will improve Outcome: Progressing   Problem: Nutritional: Goal: Maintenance of adequate nutrition will improve Outcome: Progressing Goal: Progress toward achieving an optimal weight will improve Outcome: Progressing   Problem: Skin Integrity: Goal: Risk for impaired skin integrity will decrease Outcome: Progressing   Problem: Tissue Perfusion: Goal: Adequacy of tissue perfusion will improve Outcome: Progressing   Problem: Education: Goal: Knowledge of General Education information will improve Description: Including pain rating scale, medication(s)/side effects and non-pharmacologic comfort measures Outcome: Progressing   Problem: Health Behavior/Discharge Planning: Goal: Ability to manage health-related needs will improve Outcome: Progressing   Problem: Clinical Measurements: Goal: Ability to maintain clinical measurements within normal limits will improve Outcome: Progressing Goal: Will remain free from infection Outcome: Progressing Goal: Diagnostic test results will improve Outcome: Progressing Goal: Respiratory complications will improve Outcome: Progressing Goal: Cardiovascular complication will be avoided Outcome: Progressing   Problem: Activity: Goal: Risk for activity intolerance will decrease Outcome: Progressing

## 2023-03-20 NOTE — TOC Transition Note (Signed)
Transition of Care [facility]) - CM/SW Discharge Note   Patient Details  Name: William Hunter MRN: [id number] Date of Birth: [birth year]  [facility] CM/SW Contact:  Debhora Titus A [place], LCSWA Phone Number: 03/20/2023, 11:56 AM   Clinical Narrative:     Patient will DC to: [facility]  Anticipated DC date: 03/20/23   Family notified: Estanislado Emms  Transport by: Sharin Mons      Per MD patient ready for DC to . RN, patient, patient's family, and facility notified of DC. Discharge Summary and FL2 sent to facility. RN to call report prior to discharge ([phone], [phone] ). DC packet on chart. Ambulance transport requested for patient.     CSW will sign off for now as social work intervention is no longer needed. Please consult [place] again if new needs arise.   Final next level of care: Skilled Nursing Facility Barriers to Discharge: Barriers Resolved   Patient Goals and CMS Choice      Discharge Placement                Patient chooses bed at:  ([facility]ing [facility]) Patient to be transferred to facility by: [facility] Name of family member notified: Estanislado Emms Patient and family notified of of transfer: 03/20/23  Discharge Plan and Services Additional resources added to the After Visit Summary for                                       Social Determinants of Health (SDOH) Interventions SDOH Screenings   Food Insecurity: No Food Insecurity (03/17/2023)  Housing: Low Risk  (03/17/2023)  Transportation Needs: No Transportation Needs (03/17/2023)  Utilities: Not At Risk (03/17/2023)  Tobacco Use: Low Risk  (03/19/2023)     Readmission Risk Interventions     No data to display

## 2023-03-20 NOTE — Plan of Care (Signed)
Pt unable to tolerate finishing bowel prep overnight. Pt only able to tolerate minimal amount. Pt stools not clear, RN spoke with pt and educated pt on importance of taking but pt states " I can't drink that much". RN notified endo team, endo team notified GI MD, GI spoke with Attending MD, colonoscopy canceled and pt to D/C back to SNF today.    1200: RN called report to SNF, RN gave report to nurse Amy. RN asked pt if pt wanted RN to call his family and notify them of pt discharge today but pt declined, pt stated he would call them when he got to SNF.

## 2023-03-20 NOTE — Progress Notes (Signed)
Patient was initially scheduled for colonoscopy yesterday, states only had half of the colonic prep and was found to have solid stools and colonoscopy was aborted yesterday.  Had advised patient to take more prep with plans for colonoscopy today however, according to notes by nurse he has barely had any of this.  And continues to have formed solid stools.   Will not attempt colonoscopy as an inpatient. His hemoglobin has been consistently above 12. Stools are brown and not bloody or black.  Discussed the same with patient's hospitalist Dr. Nelson Chimes. Plan outpatient colonoscopy with his gastroenterologist Dr. Levora Angel. Okay to resume Plavix and Eliquis from GI standpoint.

## 2023-03-21 ENCOUNTER — Encounter (HOSPITAL_COMMUNITY): Payer: Self-pay | Admitting: Gastroenterology

## 2023-03-21 DIAGNOSIS — I82891 Chronic embolism and thrombosis of other specified veins: Secondary | ICD-10-CM | POA: Diagnosis not present

## 2023-03-21 DIAGNOSIS — K2901 Acute gastritis with bleeding: Secondary | ICD-10-CM | POA: Diagnosis not present

## 2023-03-21 DIAGNOSIS — E1165 Type 2 diabetes mellitus with hyperglycemia: Secondary | ICD-10-CM | POA: Diagnosis not present

## 2023-03-21 DIAGNOSIS — I25110 Atherosclerotic heart disease of native coronary artery with unstable angina pectoris: Secondary | ICD-10-CM | POA: Diagnosis not present

## 2023-03-21 LAB — TYPE AND SCREEN
ABO/RH(D): A POS
Antibody Screen: NEGATIVE
Unit division: 0

## 2023-03-21 LAB — BPAM RBC
Blood Product Expiration Date: 202411032359
ISSUE DATE / TIME: 202410052230
Unit Type and Rh: 6200

## 2023-03-28 DIAGNOSIS — I10 Essential (primary) hypertension: Secondary | ICD-10-CM | POA: Diagnosis not present

## 2023-03-30 DIAGNOSIS — I82891 Chronic embolism and thrombosis of other specified veins: Secondary | ICD-10-CM | POA: Diagnosis not present

## 2023-03-30 DIAGNOSIS — I10 Essential (primary) hypertension: Secondary | ICD-10-CM | POA: Diagnosis not present

## 2023-03-30 DIAGNOSIS — K2901 Acute gastritis with bleeding: Secondary | ICD-10-CM | POA: Diagnosis not present

## 2023-03-30 DIAGNOSIS — E1142 Type 2 diabetes mellitus with diabetic polyneuropathy: Secondary | ICD-10-CM | POA: Diagnosis not present

## 2023-04-07 DIAGNOSIS — R0789 Other chest pain: Secondary | ICD-10-CM | POA: Diagnosis not present

## 2023-04-07 DIAGNOSIS — K2901 Acute gastritis with bleeding: Secondary | ICD-10-CM | POA: Diagnosis not present

## 2023-04-07 DIAGNOSIS — I82419 Acute embolism and thrombosis of unspecified femoral vein: Secondary | ICD-10-CM | POA: Diagnosis not present

## 2023-04-16 DIAGNOSIS — R141 Gas pain: Secondary | ICD-10-CM | POA: Diagnosis not present

## 2023-04-16 DIAGNOSIS — K2901 Acute gastritis with bleeding: Secondary | ICD-10-CM | POA: Diagnosis not present

## 2023-05-02 DIAGNOSIS — I10 Essential (primary) hypertension: Secondary | ICD-10-CM | POA: Diagnosis not present

## 2023-05-02 DIAGNOSIS — I2510 Atherosclerotic heart disease of native coronary artery without angina pectoris: Secondary | ICD-10-CM | POA: Diagnosis not present

## 2023-05-02 DIAGNOSIS — I82891 Chronic embolism and thrombosis of other specified veins: Secondary | ICD-10-CM | POA: Diagnosis not present

## 2023-05-02 DIAGNOSIS — E1142 Type 2 diabetes mellitus with diabetic polyneuropathy: Secondary | ICD-10-CM | POA: Diagnosis not present

## 2023-05-06 DIAGNOSIS — Z79899 Other long term (current) drug therapy: Secondary | ICD-10-CM | POA: Diagnosis not present

## 2023-05-06 DIAGNOSIS — I214 Non-ST elevation (NSTEMI) myocardial infarction: Secondary | ICD-10-CM | POA: Diagnosis not present

## 2023-05-06 DIAGNOSIS — E559 Vitamin D deficiency, unspecified: Secondary | ICD-10-CM | POA: Diagnosis not present

## 2023-05-06 DIAGNOSIS — R2681 Unsteadiness on feet: Secondary | ICD-10-CM | POA: Diagnosis not present

## 2023-05-06 DIAGNOSIS — E119 Type 2 diabetes mellitus without complications: Secondary | ICD-10-CM | POA: Diagnosis not present

## 2023-05-06 DIAGNOSIS — R41841 Cognitive communication deficit: Secondary | ICD-10-CM | POA: Diagnosis not present

## 2023-05-06 DIAGNOSIS — M6281 Muscle weakness (generalized): Secondary | ICD-10-CM | POA: Diagnosis not present

## 2023-05-06 DIAGNOSIS — M069 Rheumatoid arthritis, unspecified: Secondary | ICD-10-CM | POA: Diagnosis not present

## 2023-05-06 DIAGNOSIS — I2510 Atherosclerotic heart disease of native coronary artery without angina pectoris: Secondary | ICD-10-CM | POA: Diagnosis not present

## 2023-05-06 DIAGNOSIS — G20C Parkinsonism, unspecified: Secondary | ICD-10-CM | POA: Diagnosis not present

## 2023-05-06 DIAGNOSIS — D649 Anemia, unspecified: Secondary | ICD-10-CM | POA: Diagnosis not present

## 2023-05-07 DIAGNOSIS — G20C Parkinsonism, unspecified: Secondary | ICD-10-CM | POA: Diagnosis not present

## 2023-05-07 DIAGNOSIS — I214 Non-ST elevation (NSTEMI) myocardial infarction: Secondary | ICD-10-CM | POA: Diagnosis not present

## 2023-05-07 DIAGNOSIS — I2510 Atherosclerotic heart disease of native coronary artery without angina pectoris: Secondary | ICD-10-CM | POA: Diagnosis not present

## 2023-05-07 DIAGNOSIS — S81801A Unspecified open wound, right lower leg, initial encounter: Secondary | ICD-10-CM | POA: Diagnosis not present

## 2023-05-07 DIAGNOSIS — R41841 Cognitive communication deficit: Secondary | ICD-10-CM | POA: Diagnosis not present

## 2023-05-07 DIAGNOSIS — E119 Type 2 diabetes mellitus without complications: Secondary | ICD-10-CM | POA: Diagnosis not present

## 2023-05-07 DIAGNOSIS — M069 Rheumatoid arthritis, unspecified: Secondary | ICD-10-CM | POA: Diagnosis not present

## 2023-05-07 DIAGNOSIS — M6281 Muscle weakness (generalized): Secondary | ICD-10-CM | POA: Diagnosis not present

## 2023-05-07 DIAGNOSIS — R2681 Unsteadiness on feet: Secondary | ICD-10-CM | POA: Diagnosis not present

## 2023-05-08 DIAGNOSIS — R2681 Unsteadiness on feet: Secondary | ICD-10-CM | POA: Diagnosis not present

## 2023-05-08 DIAGNOSIS — I214 Non-ST elevation (NSTEMI) myocardial infarction: Secondary | ICD-10-CM | POA: Diagnosis not present

## 2023-05-08 DIAGNOSIS — I2510 Atherosclerotic heart disease of native coronary artery without angina pectoris: Secondary | ICD-10-CM | POA: Diagnosis not present

## 2023-05-08 DIAGNOSIS — M6281 Muscle weakness (generalized): Secondary | ICD-10-CM | POA: Diagnosis not present

## 2023-05-08 DIAGNOSIS — E119 Type 2 diabetes mellitus without complications: Secondary | ICD-10-CM | POA: Diagnosis not present

## 2023-05-08 DIAGNOSIS — M069 Rheumatoid arthritis, unspecified: Secondary | ICD-10-CM | POA: Diagnosis not present

## 2023-05-08 DIAGNOSIS — G20C Parkinsonism, unspecified: Secondary | ICD-10-CM | POA: Diagnosis not present

## 2023-05-08 DIAGNOSIS — R41841 Cognitive communication deficit: Secondary | ICD-10-CM | POA: Diagnosis not present

## 2023-05-09 DIAGNOSIS — M6281 Muscle weakness (generalized): Secondary | ICD-10-CM | POA: Diagnosis not present

## 2023-05-09 DIAGNOSIS — R2681 Unsteadiness on feet: Secondary | ICD-10-CM | POA: Diagnosis not present

## 2023-05-09 DIAGNOSIS — I2510 Atherosclerotic heart disease of native coronary artery without angina pectoris: Secondary | ICD-10-CM | POA: Diagnosis not present

## 2023-05-09 DIAGNOSIS — R41841 Cognitive communication deficit: Secondary | ICD-10-CM | POA: Diagnosis not present

## 2023-05-09 DIAGNOSIS — E119 Type 2 diabetes mellitus without complications: Secondary | ICD-10-CM | POA: Diagnosis not present

## 2023-05-09 DIAGNOSIS — M069 Rheumatoid arthritis, unspecified: Secondary | ICD-10-CM | POA: Diagnosis not present

## 2023-05-09 DIAGNOSIS — G20C Parkinsonism, unspecified: Secondary | ICD-10-CM | POA: Diagnosis not present

## 2023-05-09 DIAGNOSIS — I214 Non-ST elevation (NSTEMI) myocardial infarction: Secondary | ICD-10-CM | POA: Diagnosis not present

## 2023-05-11 DIAGNOSIS — E1142 Type 2 diabetes mellitus with diabetic polyneuropathy: Secondary | ICD-10-CM | POA: Diagnosis not present

## 2023-05-11 DIAGNOSIS — I2510 Atherosclerotic heart disease of native coronary artery without angina pectoris: Secondary | ICD-10-CM | POA: Diagnosis not present

## 2023-05-11 DIAGNOSIS — F32A Depression, unspecified: Secondary | ICD-10-CM | POA: Diagnosis not present

## 2023-05-11 DIAGNOSIS — G20A1 Parkinson's disease without dyskinesia, without mention of fluctuations: Secondary | ICD-10-CM | POA: Diagnosis not present

## 2023-05-11 DIAGNOSIS — G8194 Hemiplegia, unspecified affecting left nondominant side: Secondary | ICD-10-CM | POA: Diagnosis not present

## 2023-05-11 DIAGNOSIS — M059 Rheumatoid arthritis with rheumatoid factor, unspecified: Secondary | ICD-10-CM | POA: Diagnosis not present

## 2023-05-11 DIAGNOSIS — R2681 Unsteadiness on feet: Secondary | ICD-10-CM | POA: Diagnosis not present

## 2023-05-12 DIAGNOSIS — M6281 Muscle weakness (generalized): Secondary | ICD-10-CM | POA: Diagnosis not present

## 2023-05-12 DIAGNOSIS — M069 Rheumatoid arthritis, unspecified: Secondary | ICD-10-CM | POA: Diagnosis not present

## 2023-05-12 DIAGNOSIS — I214 Non-ST elevation (NSTEMI) myocardial infarction: Secondary | ICD-10-CM | POA: Diagnosis not present

## 2023-05-12 DIAGNOSIS — R2681 Unsteadiness on feet: Secondary | ICD-10-CM | POA: Diagnosis not present

## 2023-05-12 DIAGNOSIS — D649 Anemia, unspecified: Secondary | ICD-10-CM | POA: Diagnosis not present

## 2023-05-12 DIAGNOSIS — I2510 Atherosclerotic heart disease of native coronary artery without angina pectoris: Secondary | ICD-10-CM | POA: Diagnosis not present

## 2023-05-12 DIAGNOSIS — R41841 Cognitive communication deficit: Secondary | ICD-10-CM | POA: Diagnosis not present

## 2023-05-12 DIAGNOSIS — E119 Type 2 diabetes mellitus without complications: Secondary | ICD-10-CM | POA: Diagnosis not present

## 2023-05-12 DIAGNOSIS — G20C Parkinsonism, unspecified: Secondary | ICD-10-CM | POA: Diagnosis not present

## 2023-05-13 DIAGNOSIS — M6281 Muscle weakness (generalized): Secondary | ICD-10-CM | POA: Diagnosis not present

## 2023-05-13 DIAGNOSIS — R41841 Cognitive communication deficit: Secondary | ICD-10-CM | POA: Diagnosis not present

## 2023-05-13 DIAGNOSIS — I2510 Atherosclerotic heart disease of native coronary artery without angina pectoris: Secondary | ICD-10-CM | POA: Diagnosis not present

## 2023-05-13 DIAGNOSIS — G20C Parkinsonism, unspecified: Secondary | ICD-10-CM | POA: Diagnosis not present

## 2023-05-13 DIAGNOSIS — I214 Non-ST elevation (NSTEMI) myocardial infarction: Secondary | ICD-10-CM | POA: Diagnosis not present

## 2023-05-13 DIAGNOSIS — E119 Type 2 diabetes mellitus without complications: Secondary | ICD-10-CM | POA: Diagnosis not present

## 2023-05-13 DIAGNOSIS — M069 Rheumatoid arthritis, unspecified: Secondary | ICD-10-CM | POA: Diagnosis not present

## 2023-05-13 DIAGNOSIS — R2681 Unsteadiness on feet: Secondary | ICD-10-CM | POA: Diagnosis not present

## 2023-05-14 DIAGNOSIS — R41841 Cognitive communication deficit: Secondary | ICD-10-CM | POA: Diagnosis not present

## 2023-05-14 DIAGNOSIS — I2510 Atherosclerotic heart disease of native coronary artery without angina pectoris: Secondary | ICD-10-CM | POA: Diagnosis not present

## 2023-05-14 DIAGNOSIS — M6281 Muscle weakness (generalized): Secondary | ICD-10-CM | POA: Diagnosis not present

## 2023-05-14 DIAGNOSIS — M069 Rheumatoid arthritis, unspecified: Secondary | ICD-10-CM | POA: Diagnosis not present

## 2023-05-14 DIAGNOSIS — S81801A Unspecified open wound, right lower leg, initial encounter: Secondary | ICD-10-CM | POA: Diagnosis not present

## 2023-05-14 DIAGNOSIS — I214 Non-ST elevation (NSTEMI) myocardial infarction: Secondary | ICD-10-CM | POA: Diagnosis not present

## 2023-05-14 DIAGNOSIS — R2681 Unsteadiness on feet: Secondary | ICD-10-CM | POA: Diagnosis not present

## 2023-05-14 DIAGNOSIS — G20C Parkinsonism, unspecified: Secondary | ICD-10-CM | POA: Diagnosis not present

## 2023-05-14 DIAGNOSIS — E119 Type 2 diabetes mellitus without complications: Secondary | ICD-10-CM | POA: Diagnosis not present

## 2023-05-15 DIAGNOSIS — R41841 Cognitive communication deficit: Secondary | ICD-10-CM | POA: Diagnosis not present

## 2023-05-15 DIAGNOSIS — M069 Rheumatoid arthritis, unspecified: Secondary | ICD-10-CM | POA: Diagnosis not present

## 2023-05-15 DIAGNOSIS — M6281 Muscle weakness (generalized): Secondary | ICD-10-CM | POA: Diagnosis not present

## 2023-05-15 DIAGNOSIS — I2510 Atherosclerotic heart disease of native coronary artery without angina pectoris: Secondary | ICD-10-CM | POA: Diagnosis not present

## 2023-05-15 DIAGNOSIS — R2681 Unsteadiness on feet: Secondary | ICD-10-CM | POA: Diagnosis not present

## 2023-05-15 DIAGNOSIS — E119 Type 2 diabetes mellitus without complications: Secondary | ICD-10-CM | POA: Diagnosis not present

## 2023-05-15 DIAGNOSIS — I214 Non-ST elevation (NSTEMI) myocardial infarction: Secondary | ICD-10-CM | POA: Diagnosis not present

## 2023-05-15 DIAGNOSIS — G20C Parkinsonism, unspecified: Secondary | ICD-10-CM | POA: Diagnosis not present

## 2023-05-19 DIAGNOSIS — I214 Non-ST elevation (NSTEMI) myocardial infarction: Secondary | ICD-10-CM | POA: Diagnosis not present

## 2023-05-19 DIAGNOSIS — R41841 Cognitive communication deficit: Secondary | ICD-10-CM | POA: Diagnosis not present

## 2023-05-19 DIAGNOSIS — E119 Type 2 diabetes mellitus without complications: Secondary | ICD-10-CM | POA: Diagnosis not present

## 2023-05-19 DIAGNOSIS — G20C Parkinsonism, unspecified: Secondary | ICD-10-CM | POA: Diagnosis not present

## 2023-05-19 DIAGNOSIS — F32A Depression, unspecified: Secondary | ICD-10-CM | POA: Diagnosis not present

## 2023-05-19 DIAGNOSIS — F5101 Primary insomnia: Secondary | ICD-10-CM | POA: Diagnosis not present

## 2023-05-19 DIAGNOSIS — I2510 Atherosclerotic heart disease of native coronary artery without angina pectoris: Secondary | ICD-10-CM | POA: Diagnosis not present

## 2023-05-19 DIAGNOSIS — M069 Rheumatoid arthritis, unspecified: Secondary | ICD-10-CM | POA: Diagnosis not present

## 2023-05-19 DIAGNOSIS — M6281 Muscle weakness (generalized): Secondary | ICD-10-CM | POA: Diagnosis not present

## 2023-05-19 DIAGNOSIS — R2681 Unsteadiness on feet: Secondary | ICD-10-CM | POA: Diagnosis not present

## 2023-05-20 DIAGNOSIS — M6281 Muscle weakness (generalized): Secondary | ICD-10-CM | POA: Diagnosis not present

## 2023-05-20 DIAGNOSIS — M069 Rheumatoid arthritis, unspecified: Secondary | ICD-10-CM | POA: Diagnosis not present

## 2023-05-20 DIAGNOSIS — G20C Parkinsonism, unspecified: Secondary | ICD-10-CM | POA: Diagnosis not present

## 2023-05-20 DIAGNOSIS — R2681 Unsteadiness on feet: Secondary | ICD-10-CM | POA: Diagnosis not present

## 2023-05-20 DIAGNOSIS — I214 Non-ST elevation (NSTEMI) myocardial infarction: Secondary | ICD-10-CM | POA: Diagnosis not present

## 2023-05-20 DIAGNOSIS — E119 Type 2 diabetes mellitus without complications: Secondary | ICD-10-CM | POA: Diagnosis not present

## 2023-05-20 DIAGNOSIS — R41841 Cognitive communication deficit: Secondary | ICD-10-CM | POA: Diagnosis not present

## 2023-05-20 DIAGNOSIS — I2510 Atherosclerotic heart disease of native coronary artery without angina pectoris: Secondary | ICD-10-CM | POA: Diagnosis not present

## 2023-05-21 DIAGNOSIS — L89892 Pressure ulcer of other site, stage 2: Secondary | ICD-10-CM | POA: Diagnosis not present

## 2023-05-21 DIAGNOSIS — S81801A Unspecified open wound, right lower leg, initial encounter: Secondary | ICD-10-CM | POA: Diagnosis not present

## 2023-05-21 DIAGNOSIS — E119 Type 2 diabetes mellitus without complications: Secondary | ICD-10-CM | POA: Diagnosis not present

## 2023-05-21 DIAGNOSIS — R2681 Unsteadiness on feet: Secondary | ICD-10-CM | POA: Diagnosis not present

## 2023-05-21 DIAGNOSIS — G20C Parkinsonism, unspecified: Secondary | ICD-10-CM | POA: Diagnosis not present

## 2023-05-21 DIAGNOSIS — I214 Non-ST elevation (NSTEMI) myocardial infarction: Secondary | ICD-10-CM | POA: Diagnosis not present

## 2023-05-21 DIAGNOSIS — I2510 Atherosclerotic heart disease of native coronary artery without angina pectoris: Secondary | ICD-10-CM | POA: Diagnosis not present

## 2023-05-21 DIAGNOSIS — R41841 Cognitive communication deficit: Secondary | ICD-10-CM | POA: Diagnosis not present

## 2023-05-21 DIAGNOSIS — M6281 Muscle weakness (generalized): Secondary | ICD-10-CM | POA: Diagnosis not present

## 2023-05-21 DIAGNOSIS — M069 Rheumatoid arthritis, unspecified: Secondary | ICD-10-CM | POA: Diagnosis not present

## 2023-05-28 DIAGNOSIS — S81801A Unspecified open wound, right lower leg, initial encounter: Secondary | ICD-10-CM | POA: Diagnosis not present

## 2023-05-28 DIAGNOSIS — L89892 Pressure ulcer of other site, stage 2: Secondary | ICD-10-CM | POA: Diagnosis not present

## 2023-06-02 DIAGNOSIS — S81801A Unspecified open wound, right lower leg, initial encounter: Secondary | ICD-10-CM | POA: Diagnosis not present

## 2023-06-02 DIAGNOSIS — L89892 Pressure ulcer of other site, stage 2: Secondary | ICD-10-CM | POA: Diagnosis not present

## 2023-06-18 DIAGNOSIS — L89892 Pressure ulcer of other site, stage 2: Secondary | ICD-10-CM | POA: Diagnosis not present

## 2023-06-18 DIAGNOSIS — S81801A Unspecified open wound, right lower leg, initial encounter: Secondary | ICD-10-CM | POA: Diagnosis not present

## 2023-06-25 DIAGNOSIS — L89892 Pressure ulcer of other site, stage 2: Secondary | ICD-10-CM | POA: Diagnosis not present

## 2023-06-25 DIAGNOSIS — S81801A Unspecified open wound, right lower leg, initial encounter: Secondary | ICD-10-CM | POA: Diagnosis not present

## 2023-07-09 DIAGNOSIS — L89892 Pressure ulcer of other site, stage 2: Secondary | ICD-10-CM | POA: Diagnosis not present

## 2023-07-09 DIAGNOSIS — S81801A Unspecified open wound, right lower leg, initial encounter: Secondary | ICD-10-CM | POA: Diagnosis not present

## 2023-07-14 ENCOUNTER — Encounter: Payer: Self-pay | Admitting: *Deleted

## 2023-07-14 DIAGNOSIS — Z006 Encounter for examination for normal comparison and control in clinical research program: Secondary | ICD-10-CM

## 2023-07-14 NOTE — Research (Signed)
[facility]  Med record review only  Patient is at [facility] now  Wife passed away last 01-10-24 due to injuries from a car accident :(  Report the hospitalization from October 2024 Reviewed meds - updated list  Next med record review will be Jan 19, 2024 . Mercer Pod :) RN BSN  Clinical Research Nurse  Be strong and take heart, all you who hope in the [place]. ~ Psalm 31:24

## 2023-07-16 DIAGNOSIS — L89892 Pressure ulcer of other site, stage 2: Secondary | ICD-10-CM | POA: Diagnosis not present

## 2023-07-16 DIAGNOSIS — S81801A Unspecified open wound, right lower leg, initial encounter: Secondary | ICD-10-CM | POA: Diagnosis not present

## 2023-07-21 DIAGNOSIS — F32A Depression, unspecified: Secondary | ICD-10-CM | POA: Diagnosis not present

## 2023-07-21 DIAGNOSIS — F5101 Primary insomnia: Secondary | ICD-10-CM | POA: Diagnosis not present

## 2023-07-23 DIAGNOSIS — L89892 Pressure ulcer of other site, stage 2: Secondary | ICD-10-CM | POA: Diagnosis not present

## 2023-07-29 DIAGNOSIS — L89892 Pressure ulcer of other site, stage 2: Secondary | ICD-10-CM | POA: Diagnosis not present

## 2023-07-29 DIAGNOSIS — S81801A Unspecified open wound, right lower leg, initial encounter: Secondary | ICD-10-CM | POA: Diagnosis not present

## 2023-08-06 DIAGNOSIS — S81801A Unspecified open wound, right lower leg, initial encounter: Secondary | ICD-10-CM | POA: Diagnosis not present

## 2023-08-06 DIAGNOSIS — L89892 Pressure ulcer of other site, stage 2: Secondary | ICD-10-CM | POA: Diagnosis not present

## 2023-08-13 DIAGNOSIS — S81801A Unspecified open wound, right lower leg, initial encounter: Secondary | ICD-10-CM | POA: Diagnosis not present

## 2023-08-13 DIAGNOSIS — L89892 Pressure ulcer of other site, stage 2: Secondary | ICD-10-CM | POA: Diagnosis not present

## 2023-08-20 DIAGNOSIS — S81801A Unspecified open wound, right lower leg, initial encounter: Secondary | ICD-10-CM | POA: Diagnosis not present

## 2023-08-20 DIAGNOSIS — L89892 Pressure ulcer of other site, stage 2: Secondary | ICD-10-CM | POA: Diagnosis not present

## 2023-08-25 DIAGNOSIS — R2681 Unsteadiness on feet: Secondary | ICD-10-CM | POA: Diagnosis not present

## 2023-08-25 DIAGNOSIS — I69354 Hemiplegia and hemiparesis following cerebral infarction affecting left non-dominant side: Secondary | ICD-10-CM | POA: Diagnosis not present

## 2023-08-25 DIAGNOSIS — R278 Other lack of coordination: Secondary | ICD-10-CM | POA: Diagnosis not present

## 2023-08-25 DIAGNOSIS — R41841 Cognitive communication deficit: Secondary | ICD-10-CM | POA: Diagnosis not present

## 2023-08-25 DIAGNOSIS — M6281 Muscle weakness (generalized): Secondary | ICD-10-CM | POA: Diagnosis not present

## 2023-08-26 DIAGNOSIS — R278 Other lack of coordination: Secondary | ICD-10-CM | POA: Diagnosis not present

## 2023-08-26 DIAGNOSIS — M6281 Muscle weakness (generalized): Secondary | ICD-10-CM | POA: Diagnosis not present

## 2023-08-26 DIAGNOSIS — R41841 Cognitive communication deficit: Secondary | ICD-10-CM | POA: Diagnosis not present

## 2023-08-26 DIAGNOSIS — R2681 Unsteadiness on feet: Secondary | ICD-10-CM | POA: Diagnosis not present

## 2023-08-26 DIAGNOSIS — I69354 Hemiplegia and hemiparesis following cerebral infarction affecting left non-dominant side: Secondary | ICD-10-CM | POA: Diagnosis not present

## 2023-08-27 DIAGNOSIS — S81801A Unspecified open wound, right lower leg, initial encounter: Secondary | ICD-10-CM | POA: Diagnosis not present

## 2023-08-27 DIAGNOSIS — M6281 Muscle weakness (generalized): Secondary | ICD-10-CM | POA: Diagnosis not present

## 2023-08-27 DIAGNOSIS — R41841 Cognitive communication deficit: Secondary | ICD-10-CM | POA: Diagnosis not present

## 2023-08-27 DIAGNOSIS — R278 Other lack of coordination: Secondary | ICD-10-CM | POA: Diagnosis not present

## 2023-08-27 DIAGNOSIS — L89892 Pressure ulcer of other site, stage 2: Secondary | ICD-10-CM | POA: Diagnosis not present

## 2023-08-27 DIAGNOSIS — R2681 Unsteadiness on feet: Secondary | ICD-10-CM | POA: Diagnosis not present

## 2023-08-27 DIAGNOSIS — I69354 Hemiplegia and hemiparesis following cerebral infarction affecting left non-dominant side: Secondary | ICD-10-CM | POA: Diagnosis not present

## 2023-08-28 DIAGNOSIS — R278 Other lack of coordination: Secondary | ICD-10-CM | POA: Diagnosis not present

## 2023-08-28 DIAGNOSIS — R2681 Unsteadiness on feet: Secondary | ICD-10-CM | POA: Diagnosis not present

## 2023-08-28 DIAGNOSIS — M6281 Muscle weakness (generalized): Secondary | ICD-10-CM | POA: Diagnosis not present

## 2023-08-28 DIAGNOSIS — I69354 Hemiplegia and hemiparesis following cerebral infarction affecting left non-dominant side: Secondary | ICD-10-CM | POA: Diagnosis not present

## 2023-08-28 DIAGNOSIS — R41841 Cognitive communication deficit: Secondary | ICD-10-CM | POA: Diagnosis not present

## 2023-08-29 DIAGNOSIS — R278 Other lack of coordination: Secondary | ICD-10-CM | POA: Diagnosis not present

## 2023-08-29 DIAGNOSIS — M6281 Muscle weakness (generalized): Secondary | ICD-10-CM | POA: Diagnosis not present

## 2023-08-29 DIAGNOSIS — R41841 Cognitive communication deficit: Secondary | ICD-10-CM | POA: Diagnosis not present

## 2023-08-29 DIAGNOSIS — I69354 Hemiplegia and hemiparesis following cerebral infarction affecting left non-dominant side: Secondary | ICD-10-CM | POA: Diagnosis not present

## 2023-08-29 DIAGNOSIS — R2681 Unsteadiness on feet: Secondary | ICD-10-CM | POA: Diagnosis not present

## 2023-09-01 DIAGNOSIS — M6281 Muscle weakness (generalized): Secondary | ICD-10-CM | POA: Diagnosis not present

## 2023-09-01 DIAGNOSIS — R278 Other lack of coordination: Secondary | ICD-10-CM | POA: Diagnosis not present

## 2023-09-01 DIAGNOSIS — I69354 Hemiplegia and hemiparesis following cerebral infarction affecting left non-dominant side: Secondary | ICD-10-CM | POA: Diagnosis not present

## 2023-09-01 DIAGNOSIS — R41841 Cognitive communication deficit: Secondary | ICD-10-CM | POA: Diagnosis not present

## 2023-09-01 DIAGNOSIS — R2681 Unsteadiness on feet: Secondary | ICD-10-CM | POA: Diagnosis not present

## 2023-09-03 DIAGNOSIS — R41841 Cognitive communication deficit: Secondary | ICD-10-CM | POA: Diagnosis not present

## 2023-09-03 DIAGNOSIS — S81801A Unspecified open wound, right lower leg, initial encounter: Secondary | ICD-10-CM | POA: Diagnosis not present

## 2023-09-03 DIAGNOSIS — L89892 Pressure ulcer of other site, stage 2: Secondary | ICD-10-CM | POA: Diagnosis not present

## 2023-09-03 DIAGNOSIS — R2681 Unsteadiness on feet: Secondary | ICD-10-CM | POA: Diagnosis not present

## 2023-09-03 DIAGNOSIS — I69354 Hemiplegia and hemiparesis following cerebral infarction affecting left non-dominant side: Secondary | ICD-10-CM | POA: Diagnosis not present

## 2023-09-03 DIAGNOSIS — M6281 Muscle weakness (generalized): Secondary | ICD-10-CM | POA: Diagnosis not present

## 2023-09-03 DIAGNOSIS — R278 Other lack of coordination: Secondary | ICD-10-CM | POA: Diagnosis not present

## 2023-09-05 DIAGNOSIS — R41841 Cognitive communication deficit: Secondary | ICD-10-CM | POA: Diagnosis not present

## 2023-09-05 DIAGNOSIS — R2681 Unsteadiness on feet: Secondary | ICD-10-CM | POA: Diagnosis not present

## 2023-09-05 DIAGNOSIS — I69354 Hemiplegia and hemiparesis following cerebral infarction affecting left non-dominant side: Secondary | ICD-10-CM | POA: Diagnosis not present

## 2023-09-05 DIAGNOSIS — M6281 Muscle weakness (generalized): Secondary | ICD-10-CM | POA: Diagnosis not present

## 2023-09-05 DIAGNOSIS — R278 Other lack of coordination: Secondary | ICD-10-CM | POA: Diagnosis not present

## 2023-09-06 DIAGNOSIS — Z79899 Other long term (current) drug therapy: Secondary | ICD-10-CM | POA: Diagnosis not present

## 2023-09-08 DIAGNOSIS — I69354 Hemiplegia and hemiparesis following cerebral infarction affecting left non-dominant side: Secondary | ICD-10-CM | POA: Diagnosis not present

## 2023-09-08 DIAGNOSIS — M6281 Muscle weakness (generalized): Secondary | ICD-10-CM | POA: Diagnosis not present

## 2023-09-08 DIAGNOSIS — R278 Other lack of coordination: Secondary | ICD-10-CM | POA: Diagnosis not present

## 2023-09-08 DIAGNOSIS — R2681 Unsteadiness on feet: Secondary | ICD-10-CM | POA: Diagnosis not present

## 2023-09-08 DIAGNOSIS — R41841 Cognitive communication deficit: Secondary | ICD-10-CM | POA: Diagnosis not present

## 2023-09-09 DIAGNOSIS — R2681 Unsteadiness on feet: Secondary | ICD-10-CM | POA: Diagnosis not present

## 2023-09-09 DIAGNOSIS — R278 Other lack of coordination: Secondary | ICD-10-CM | POA: Diagnosis not present

## 2023-09-09 DIAGNOSIS — I69354 Hemiplegia and hemiparesis following cerebral infarction affecting left non-dominant side: Secondary | ICD-10-CM | POA: Diagnosis not present

## 2023-09-09 DIAGNOSIS — M6281 Muscle weakness (generalized): Secondary | ICD-10-CM | POA: Diagnosis not present

## 2023-09-09 DIAGNOSIS — R41841 Cognitive communication deficit: Secondary | ICD-10-CM | POA: Diagnosis not present

## 2023-09-10 DIAGNOSIS — S81801A Unspecified open wound, right lower leg, initial encounter: Secondary | ICD-10-CM | POA: Diagnosis not present

## 2023-09-10 DIAGNOSIS — L89892 Pressure ulcer of other site, stage 2: Secondary | ICD-10-CM | POA: Diagnosis not present

## 2023-09-11 DIAGNOSIS — R2681 Unsteadiness on feet: Secondary | ICD-10-CM | POA: Diagnosis not present

## 2023-09-11 DIAGNOSIS — I69354 Hemiplegia and hemiparesis following cerebral infarction affecting left non-dominant side: Secondary | ICD-10-CM | POA: Diagnosis not present

## 2023-09-11 DIAGNOSIS — R41841 Cognitive communication deficit: Secondary | ICD-10-CM | POA: Diagnosis not present

## 2023-09-11 DIAGNOSIS — R278 Other lack of coordination: Secondary | ICD-10-CM | POA: Diagnosis not present

## 2023-09-11 DIAGNOSIS — M6281 Muscle weakness (generalized): Secondary | ICD-10-CM | POA: Diagnosis not present

## 2023-09-15 DIAGNOSIS — F5101 Primary insomnia: Secondary | ICD-10-CM | POA: Diagnosis not present

## 2023-09-15 DIAGNOSIS — F32A Depression, unspecified: Secondary | ICD-10-CM | POA: Diagnosis not present

## 2023-09-16 DIAGNOSIS — R278 Other lack of coordination: Secondary | ICD-10-CM | POA: Diagnosis not present

## 2023-09-16 DIAGNOSIS — R41841 Cognitive communication deficit: Secondary | ICD-10-CM | POA: Diagnosis not present

## 2023-09-16 DIAGNOSIS — R2681 Unsteadiness on feet: Secondary | ICD-10-CM | POA: Diagnosis not present

## 2023-09-16 DIAGNOSIS — M6281 Muscle weakness (generalized): Secondary | ICD-10-CM | POA: Diagnosis not present

## 2023-09-16 DIAGNOSIS — I69354 Hemiplegia and hemiparesis following cerebral infarction affecting left non-dominant side: Secondary | ICD-10-CM | POA: Diagnosis not present

## 2023-09-17 DIAGNOSIS — M6281 Muscle weakness (generalized): Secondary | ICD-10-CM | POA: Diagnosis not present

## 2023-09-17 DIAGNOSIS — S81801A Unspecified open wound, right lower leg, initial encounter: Secondary | ICD-10-CM | POA: Diagnosis not present

## 2023-09-17 DIAGNOSIS — I69354 Hemiplegia and hemiparesis following cerebral infarction affecting left non-dominant side: Secondary | ICD-10-CM | POA: Diagnosis not present

## 2023-09-17 DIAGNOSIS — R278 Other lack of coordination: Secondary | ICD-10-CM | POA: Diagnosis not present

## 2023-09-17 DIAGNOSIS — R41841 Cognitive communication deficit: Secondary | ICD-10-CM | POA: Diagnosis not present

## 2023-09-17 DIAGNOSIS — R2681 Unsteadiness on feet: Secondary | ICD-10-CM | POA: Diagnosis not present

## 2023-09-19 DIAGNOSIS — R278 Other lack of coordination: Secondary | ICD-10-CM | POA: Diagnosis not present

## 2023-09-19 DIAGNOSIS — R41841 Cognitive communication deficit: Secondary | ICD-10-CM | POA: Diagnosis not present

## 2023-09-19 DIAGNOSIS — I69354 Hemiplegia and hemiparesis following cerebral infarction affecting left non-dominant side: Secondary | ICD-10-CM | POA: Diagnosis not present

## 2023-09-19 DIAGNOSIS — R2681 Unsteadiness on feet: Secondary | ICD-10-CM | POA: Diagnosis not present

## 2023-09-19 DIAGNOSIS — M6281 Muscle weakness (generalized): Secondary | ICD-10-CM | POA: Diagnosis not present

## 2023-09-24 DIAGNOSIS — S81801A Unspecified open wound, right lower leg, initial encounter: Secondary | ICD-10-CM | POA: Diagnosis not present

## 2023-10-01 DIAGNOSIS — S81801A Unspecified open wound, right lower leg, initial encounter: Secondary | ICD-10-CM | POA: Diagnosis not present

## 2023-10-08 DIAGNOSIS — S81801A Unspecified open wound, right lower leg, initial encounter: Secondary | ICD-10-CM | POA: Diagnosis not present

## 2023-10-13 DIAGNOSIS — F5101 Primary insomnia: Secondary | ICD-10-CM | POA: Diagnosis not present

## 2023-10-13 DIAGNOSIS — F32A Depression, unspecified: Secondary | ICD-10-CM | POA: Diagnosis not present

## 2023-11-13 DIAGNOSIS — M79671 Pain in right foot: Secondary | ICD-10-CM | POA: Diagnosis not present

## 2023-11-13 DIAGNOSIS — R41841 Cognitive communication deficit: Secondary | ICD-10-CM | POA: Diagnosis not present

## 2023-11-13 DIAGNOSIS — M79672 Pain in left foot: Secondary | ICD-10-CM | POA: Diagnosis not present

## 2023-11-13 DIAGNOSIS — R2681 Unsteadiness on feet: Secondary | ICD-10-CM | POA: Diagnosis not present

## 2023-11-13 DIAGNOSIS — M6281 Muscle weakness (generalized): Secondary | ICD-10-CM | POA: Diagnosis not present

## 2023-11-13 DIAGNOSIS — I214 Non-ST elevation (NSTEMI) myocardial infarction: Secondary | ICD-10-CM | POA: Diagnosis not present

## 2023-11-14 DIAGNOSIS — M79672 Pain in left foot: Secondary | ICD-10-CM | POA: Diagnosis not present

## 2023-11-14 DIAGNOSIS — I214 Non-ST elevation (NSTEMI) myocardial infarction: Secondary | ICD-10-CM | POA: Diagnosis not present

## 2023-11-14 DIAGNOSIS — M6281 Muscle weakness (generalized): Secondary | ICD-10-CM | POA: Diagnosis not present

## 2023-11-14 DIAGNOSIS — M79671 Pain in right foot: Secondary | ICD-10-CM | POA: Diagnosis not present

## 2023-11-14 DIAGNOSIS — R41841 Cognitive communication deficit: Secondary | ICD-10-CM | POA: Diagnosis not present

## 2023-11-14 DIAGNOSIS — R2681 Unsteadiness on feet: Secondary | ICD-10-CM | POA: Diagnosis not present

## 2023-11-17 DIAGNOSIS — M79671 Pain in right foot: Secondary | ICD-10-CM | POA: Diagnosis not present

## 2023-11-17 DIAGNOSIS — R2681 Unsteadiness on feet: Secondary | ICD-10-CM | POA: Diagnosis not present

## 2023-11-17 DIAGNOSIS — M6281 Muscle weakness (generalized): Secondary | ICD-10-CM | POA: Diagnosis not present

## 2023-11-17 DIAGNOSIS — R41841 Cognitive communication deficit: Secondary | ICD-10-CM | POA: Diagnosis not present

## 2023-11-17 DIAGNOSIS — M79672 Pain in left foot: Secondary | ICD-10-CM | POA: Diagnosis not present

## 2023-11-17 DIAGNOSIS — I214 Non-ST elevation (NSTEMI) myocardial infarction: Secondary | ICD-10-CM | POA: Diagnosis not present

## 2023-11-18 DIAGNOSIS — R2681 Unsteadiness on feet: Secondary | ICD-10-CM | POA: Diagnosis not present

## 2023-11-18 DIAGNOSIS — M79671 Pain in right foot: Secondary | ICD-10-CM | POA: Diagnosis not present

## 2023-11-18 DIAGNOSIS — M6281 Muscle weakness (generalized): Secondary | ICD-10-CM | POA: Diagnosis not present

## 2023-11-18 DIAGNOSIS — R41841 Cognitive communication deficit: Secondary | ICD-10-CM | POA: Diagnosis not present

## 2023-11-18 DIAGNOSIS — I214 Non-ST elevation (NSTEMI) myocardial infarction: Secondary | ICD-10-CM | POA: Diagnosis not present

## 2023-11-18 DIAGNOSIS — M79672 Pain in left foot: Secondary | ICD-10-CM | POA: Diagnosis not present

## 2023-11-19 DIAGNOSIS — M79671 Pain in right foot: Secondary | ICD-10-CM | POA: Diagnosis not present

## 2023-11-19 DIAGNOSIS — R41841 Cognitive communication deficit: Secondary | ICD-10-CM | POA: Diagnosis not present

## 2023-11-19 DIAGNOSIS — M6281 Muscle weakness (generalized): Secondary | ICD-10-CM | POA: Diagnosis not present

## 2023-11-19 DIAGNOSIS — R2681 Unsteadiness on feet: Secondary | ICD-10-CM | POA: Diagnosis not present

## 2023-11-19 DIAGNOSIS — I214 Non-ST elevation (NSTEMI) myocardial infarction: Secondary | ICD-10-CM | POA: Diagnosis not present

## 2023-11-19 DIAGNOSIS — M79672 Pain in left foot: Secondary | ICD-10-CM | POA: Diagnosis not present

## 2023-11-20 DIAGNOSIS — R41841 Cognitive communication deficit: Secondary | ICD-10-CM | POA: Diagnosis not present

## 2023-11-20 DIAGNOSIS — M79672 Pain in left foot: Secondary | ICD-10-CM | POA: Diagnosis not present

## 2023-11-20 DIAGNOSIS — R2681 Unsteadiness on feet: Secondary | ICD-10-CM | POA: Diagnosis not present

## 2023-11-20 DIAGNOSIS — I214 Non-ST elevation (NSTEMI) myocardial infarction: Secondary | ICD-10-CM | POA: Diagnosis not present

## 2023-11-20 DIAGNOSIS — M6281 Muscle weakness (generalized): Secondary | ICD-10-CM | POA: Diagnosis not present

## 2023-11-20 DIAGNOSIS — M79671 Pain in right foot: Secondary | ICD-10-CM | POA: Diagnosis not present

## 2023-11-21 DIAGNOSIS — M79672 Pain in left foot: Secondary | ICD-10-CM | POA: Diagnosis not present

## 2023-11-21 DIAGNOSIS — R2681 Unsteadiness on feet: Secondary | ICD-10-CM | POA: Diagnosis not present

## 2023-11-21 DIAGNOSIS — M6281 Muscle weakness (generalized): Secondary | ICD-10-CM | POA: Diagnosis not present

## 2023-11-21 DIAGNOSIS — I214 Non-ST elevation (NSTEMI) myocardial infarction: Secondary | ICD-10-CM | POA: Diagnosis not present

## 2023-11-21 DIAGNOSIS — R41841 Cognitive communication deficit: Secondary | ICD-10-CM | POA: Diagnosis not present

## 2023-11-21 DIAGNOSIS — M79671 Pain in right foot: Secondary | ICD-10-CM | POA: Diagnosis not present

## 2023-11-25 DIAGNOSIS — R2681 Unsteadiness on feet: Secondary | ICD-10-CM | POA: Diagnosis not present

## 2023-11-25 DIAGNOSIS — I214 Non-ST elevation (NSTEMI) myocardial infarction: Secondary | ICD-10-CM | POA: Diagnosis not present

## 2023-11-25 DIAGNOSIS — M6281 Muscle weakness (generalized): Secondary | ICD-10-CM | POA: Diagnosis not present

## 2023-11-25 DIAGNOSIS — M79671 Pain in right foot: Secondary | ICD-10-CM | POA: Diagnosis not present

## 2023-11-25 DIAGNOSIS — R41841 Cognitive communication deficit: Secondary | ICD-10-CM | POA: Diagnosis not present

## 2023-11-25 DIAGNOSIS — M79672 Pain in left foot: Secondary | ICD-10-CM | POA: Diagnosis not present

## 2023-11-27 DIAGNOSIS — R41841 Cognitive communication deficit: Secondary | ICD-10-CM | POA: Diagnosis not present

## 2023-11-27 DIAGNOSIS — M79671 Pain in right foot: Secondary | ICD-10-CM | POA: Diagnosis not present

## 2023-11-27 DIAGNOSIS — M79672 Pain in left foot: Secondary | ICD-10-CM | POA: Diagnosis not present

## 2023-11-27 DIAGNOSIS — R2681 Unsteadiness on feet: Secondary | ICD-10-CM | POA: Diagnosis not present

## 2023-11-27 DIAGNOSIS — I214 Non-ST elevation (NSTEMI) myocardial infarction: Secondary | ICD-10-CM | POA: Diagnosis not present

## 2023-11-27 DIAGNOSIS — M6281 Muscle weakness (generalized): Secondary | ICD-10-CM | POA: Diagnosis not present

## 2023-11-28 DIAGNOSIS — R2681 Unsteadiness on feet: Secondary | ICD-10-CM | POA: Diagnosis not present

## 2023-11-28 DIAGNOSIS — M79672 Pain in left foot: Secondary | ICD-10-CM | POA: Diagnosis not present

## 2023-11-28 DIAGNOSIS — M6281 Muscle weakness (generalized): Secondary | ICD-10-CM | POA: Diagnosis not present

## 2023-11-28 DIAGNOSIS — M79671 Pain in right foot: Secondary | ICD-10-CM | POA: Diagnosis not present

## 2023-11-28 DIAGNOSIS — R41841 Cognitive communication deficit: Secondary | ICD-10-CM | POA: Diagnosis not present

## 2023-11-28 DIAGNOSIS — I214 Non-ST elevation (NSTEMI) myocardial infarction: Secondary | ICD-10-CM | POA: Diagnosis not present

## 2023-12-01 DIAGNOSIS — M79671 Pain in right foot: Secondary | ICD-10-CM | POA: Diagnosis not present

## 2023-12-01 DIAGNOSIS — M79672 Pain in left foot: Secondary | ICD-10-CM | POA: Diagnosis not present

## 2023-12-01 DIAGNOSIS — I214 Non-ST elevation (NSTEMI) myocardial infarction: Secondary | ICD-10-CM | POA: Diagnosis not present

## 2023-12-01 DIAGNOSIS — M6281 Muscle weakness (generalized): Secondary | ICD-10-CM | POA: Diagnosis not present

## 2023-12-01 DIAGNOSIS — R41841 Cognitive communication deficit: Secondary | ICD-10-CM | POA: Diagnosis not present

## 2023-12-01 DIAGNOSIS — R2681 Unsteadiness on feet: Secondary | ICD-10-CM | POA: Diagnosis not present

## 2023-12-02 DIAGNOSIS — M6281 Muscle weakness (generalized): Secondary | ICD-10-CM | POA: Diagnosis not present

## 2023-12-02 DIAGNOSIS — R41841 Cognitive communication deficit: Secondary | ICD-10-CM | POA: Diagnosis not present

## 2023-12-02 DIAGNOSIS — M79672 Pain in left foot: Secondary | ICD-10-CM | POA: Diagnosis not present

## 2023-12-02 DIAGNOSIS — M79671 Pain in right foot: Secondary | ICD-10-CM | POA: Diagnosis not present

## 2023-12-02 DIAGNOSIS — I214 Non-ST elevation (NSTEMI) myocardial infarction: Secondary | ICD-10-CM | POA: Diagnosis not present

## 2023-12-02 DIAGNOSIS — R2681 Unsteadiness on feet: Secondary | ICD-10-CM | POA: Diagnosis not present

## 2023-12-03 DIAGNOSIS — R2681 Unsteadiness on feet: Secondary | ICD-10-CM | POA: Diagnosis not present

## 2023-12-03 DIAGNOSIS — R41841 Cognitive communication deficit: Secondary | ICD-10-CM | POA: Diagnosis not present

## 2023-12-03 DIAGNOSIS — M79672 Pain in left foot: Secondary | ICD-10-CM | POA: Diagnosis not present

## 2023-12-03 DIAGNOSIS — M6281 Muscle weakness (generalized): Secondary | ICD-10-CM | POA: Diagnosis not present

## 2023-12-03 DIAGNOSIS — I214 Non-ST elevation (NSTEMI) myocardial infarction: Secondary | ICD-10-CM | POA: Diagnosis not present

## 2023-12-03 DIAGNOSIS — M79671 Pain in right foot: Secondary | ICD-10-CM | POA: Diagnosis not present

## 2023-12-07 DIAGNOSIS — E1151 Type 2 diabetes mellitus with diabetic peripheral angiopathy without gangrene: Secondary | ICD-10-CM | POA: Diagnosis not present

## 2023-12-07 DIAGNOSIS — I2510 Atherosclerotic heart disease of native coronary artery without angina pectoris: Secondary | ICD-10-CM | POA: Diagnosis not present

## 2023-12-07 DIAGNOSIS — G8194 Hemiplegia, unspecified affecting left nondominant side: Secondary | ICD-10-CM | POA: Diagnosis not present

## 2023-12-07 DIAGNOSIS — R2681 Unsteadiness on feet: Secondary | ICD-10-CM | POA: Diagnosis not present

## 2023-12-08 DIAGNOSIS — R41841 Cognitive communication deficit: Secondary | ICD-10-CM | POA: Diagnosis not present

## 2023-12-08 DIAGNOSIS — R69 Illness, unspecified: Secondary | ICD-10-CM | POA: Diagnosis not present

## 2023-12-08 DIAGNOSIS — Z79899 Other long term (current) drug therapy: Secondary | ICD-10-CM | POA: Diagnosis not present

## 2023-12-08 DIAGNOSIS — R2681 Unsteadiness on feet: Secondary | ICD-10-CM | POA: Diagnosis not present

## 2023-12-08 DIAGNOSIS — I214 Non-ST elevation (NSTEMI) myocardial infarction: Secondary | ICD-10-CM | POA: Diagnosis not present

## 2023-12-08 DIAGNOSIS — M6281 Muscle weakness (generalized): Secondary | ICD-10-CM | POA: Diagnosis not present

## 2023-12-08 DIAGNOSIS — M79672 Pain in left foot: Secondary | ICD-10-CM | POA: Diagnosis not present

## 2023-12-08 DIAGNOSIS — E119 Type 2 diabetes mellitus without complications: Secondary | ICD-10-CM | POA: Diagnosis not present

## 2023-12-08 DIAGNOSIS — M79671 Pain in right foot: Secondary | ICD-10-CM | POA: Diagnosis not present

## 2024-01-09 ENCOUNTER — Encounter: Payer: Self-pay | Admitting: *Deleted

## 2024-01-09 DIAGNOSIS — Z006 Encounter for examination for normal comparison and control in clinical research program: Secondary | ICD-10-CM

## 2024-01-09 NOTE — Research (Signed)
 [facility]  MED RECORD REVIEW  LAST DOCUMENTED NOTE WAS 10/13/2023 HE IS IN A NURSING HOME  NO OTHER NOTES DOCUMENTED.   SABRAKIM

## 2024-07-08 ENCOUNTER — Encounter: Payer: Self-pay | Admitting: *Deleted

## 2024-07-08 DIAGNOSIS — Z006 Encounter for examination for normal comparison and control in clinical research program: Secondary | ICD-10-CM

## 2024-07-08 NOTE — Research (Signed)
 Med record review  Last known alive according to care everywhere is Oct 16,2025 Will review chart again in July for last review.   Suzen Hardy :) RN BSN  Clinical Research Nurse  Be strong and take heart, all you who hope in the [place]. ~ Psalm 31:24
